# Patient Record
Sex: Female | Born: 1935 | Race: White | Hispanic: No | Smoking: Former smoker
Health system: Southern US, Community
[De-identification: ages and names within clinical notes are randomized; demographics above are authoritative.]

## PROBLEM LIST (undated history)

## (undated) DIAGNOSIS — M069 Rheumatoid arthritis, unspecified: Secondary | ICD-10-CM

## (undated) DIAGNOSIS — M1990 Unspecified osteoarthritis, unspecified site: Secondary | ICD-10-CM

## (undated) DIAGNOSIS — E785 Hyperlipidemia, unspecified: Secondary | ICD-10-CM

## (undated) DIAGNOSIS — K589 Irritable bowel syndrome without diarrhea: Secondary | ICD-10-CM

## (undated) DIAGNOSIS — E039 Hypothyroidism, unspecified: Secondary | ICD-10-CM

## (undated) DIAGNOSIS — F319 Bipolar disorder, unspecified: Secondary | ICD-10-CM

## (undated) DIAGNOSIS — F419 Anxiety disorder, unspecified: Secondary | ICD-10-CM

## (undated) DIAGNOSIS — F32A Depression, unspecified: Secondary | ICD-10-CM

## (undated) HISTORY — DX: Hypothyroidism, unspecified: E03.9

## (undated) HISTORY — DX: Bipolar disorder, unspecified: F31.9

## (undated) HISTORY — PX: ABDOMINAL HYSTERECTOMY: SHX81

## (undated) HISTORY — DX: Hyperlipidemia, unspecified: E78.5

## (undated) HISTORY — PX: COLONOSCOPY: SHX174

## (undated) HISTORY — DX: Anxiety disorder, unspecified: F41.9

## (undated) HISTORY — DX: Irritable bowel syndrome, unspecified: K58.9

## (undated) HISTORY — PX: COLOSTOMY: SHX63

## (undated) HISTORY — PX: UPPER GASTROINTESTINAL ENDOSCOPY: SHX188

## (undated) HISTORY — PX: EYE SURGERY: SHX253

## (undated) HISTORY — DX: Rheumatoid arthritis, unspecified: M06.9

## (undated) HISTORY — DX: Unspecified osteoarthritis, unspecified site: M19.90

---

## 2001-05-08 ENCOUNTER — Other Ambulatory Visit: Admission: RE | Admit: 2001-05-08 | Discharge: 2001-05-08 | Payer: Self-pay | Admitting: Obstetrics and Gynecology

## 2001-12-25 ENCOUNTER — Ambulatory Visit (HOSPITAL_COMMUNITY): Admission: RE | Admit: 2001-12-25 | Discharge: 2001-12-25 | Payer: Self-pay | Admitting: Internal Medicine

## 2003-04-08 ENCOUNTER — Ambulatory Visit (HOSPITAL_COMMUNITY): Admission: RE | Admit: 2003-04-08 | Discharge: 2003-04-08 | Payer: Self-pay | Admitting: Internal Medicine

## 2003-04-18 ENCOUNTER — Ambulatory Visit (HOSPITAL_COMMUNITY): Admission: RE | Admit: 2003-04-18 | Discharge: 2003-04-18 | Payer: Self-pay | Admitting: Internal Medicine

## 2003-04-25 ENCOUNTER — Ambulatory Visit (HOSPITAL_COMMUNITY): Admission: RE | Admit: 2003-04-25 | Discharge: 2003-04-25 | Payer: Self-pay | Admitting: Obstetrics and Gynecology

## 2004-04-14 ENCOUNTER — Ambulatory Visit: Payer: Self-pay | Admitting: Orthopedic Surgery

## 2004-05-11 ENCOUNTER — Ambulatory Visit (HOSPITAL_COMMUNITY): Admission: RE | Admit: 2004-05-11 | Discharge: 2004-05-11 | Payer: Self-pay | Admitting: Internal Medicine

## 2004-05-31 ENCOUNTER — Ambulatory Visit (HOSPITAL_COMMUNITY): Admission: RE | Admit: 2004-05-31 | Discharge: 2004-05-31 | Payer: Self-pay | Admitting: Internal Medicine

## 2004-09-29 ENCOUNTER — Ambulatory Visit: Payer: Self-pay | Admitting: Internal Medicine

## 2004-10-06 ENCOUNTER — Ambulatory Visit: Payer: Self-pay | Admitting: Internal Medicine

## 2004-10-06 ENCOUNTER — Ambulatory Visit (HOSPITAL_COMMUNITY): Admission: RE | Admit: 2004-10-06 | Discharge: 2004-10-06 | Payer: Self-pay | Admitting: Internal Medicine

## 2004-11-04 ENCOUNTER — Ambulatory Visit: Payer: Self-pay | Admitting: Internal Medicine

## 2004-11-05 ENCOUNTER — Ambulatory Visit (HOSPITAL_COMMUNITY): Admission: RE | Admit: 2004-11-05 | Discharge: 2004-11-05 | Payer: Self-pay | Admitting: Internal Medicine

## 2005-04-12 ENCOUNTER — Ambulatory Visit: Payer: Self-pay | Admitting: Internal Medicine

## 2005-05-05 ENCOUNTER — Ambulatory Visit: Payer: Self-pay | Admitting: Internal Medicine

## 2005-08-01 ENCOUNTER — Ambulatory Visit: Payer: Self-pay | Admitting: Internal Medicine

## 2006-04-17 ENCOUNTER — Ambulatory Visit (HOSPITAL_COMMUNITY): Admission: RE | Admit: 2006-04-17 | Discharge: 2006-04-17 | Payer: Self-pay | Admitting: Internal Medicine

## 2006-04-25 ENCOUNTER — Ambulatory Visit (HOSPITAL_COMMUNITY): Admission: RE | Admit: 2006-04-25 | Discharge: 2006-04-25 | Payer: Self-pay | Admitting: Internal Medicine

## 2006-05-10 ENCOUNTER — Ambulatory Visit (HOSPITAL_COMMUNITY): Admission: RE | Admit: 2006-05-10 | Discharge: 2006-05-10 | Payer: Self-pay | Admitting: General Surgery

## 2006-07-19 ENCOUNTER — Ambulatory Visit (HOSPITAL_COMMUNITY): Admission: RE | Admit: 2006-07-19 | Discharge: 2006-07-19 | Payer: Self-pay | Admitting: Internal Medicine

## 2006-08-16 ENCOUNTER — Ambulatory Visit (HOSPITAL_COMMUNITY): Admission: RE | Admit: 2006-08-16 | Discharge: 2006-08-16 | Payer: Self-pay | Admitting: Otolaryngology

## 2007-01-02 ENCOUNTER — Ambulatory Visit (HOSPITAL_COMMUNITY): Admission: RE | Admit: 2007-01-02 | Discharge: 2007-01-02 | Payer: Self-pay | Admitting: Internal Medicine

## 2007-01-12 ENCOUNTER — Encounter (HOSPITAL_COMMUNITY): Admission: RE | Admit: 2007-01-12 | Discharge: 2007-02-11 | Payer: Self-pay | Admitting: Internal Medicine

## 2007-06-19 ENCOUNTER — Ambulatory Visit (HOSPITAL_COMMUNITY): Admission: RE | Admit: 2007-06-19 | Discharge: 2007-06-19 | Payer: Self-pay | Admitting: Internal Medicine

## 2007-06-25 ENCOUNTER — Ambulatory Visit (HOSPITAL_COMMUNITY): Admission: RE | Admit: 2007-06-25 | Discharge: 2007-06-25 | Payer: Self-pay | Admitting: Internal Medicine

## 2009-05-12 ENCOUNTER — Ambulatory Visit: Payer: Self-pay | Admitting: Orthopedic Surgery

## 2009-05-12 DIAGNOSIS — M7750 Other enthesopathy of unspecified foot: Secondary | ICD-10-CM | POA: Insufficient documentation

## 2009-05-12 DIAGNOSIS — M21619 Bunion of unspecified foot: Secondary | ICD-10-CM | POA: Insufficient documentation

## 2009-05-14 ENCOUNTER — Encounter: Payer: Self-pay | Admitting: Orthopedic Surgery

## 2009-11-19 ENCOUNTER — Ambulatory Visit (HOSPITAL_COMMUNITY): Admission: RE | Admit: 2009-11-19 | Discharge: 2009-11-19 | Payer: Self-pay | Admitting: Obstetrics and Gynecology

## 2009-11-20 ENCOUNTER — Encounter (INDEPENDENT_AMBULATORY_CARE_PROVIDER_SITE_OTHER): Payer: Self-pay

## 2009-12-09 ENCOUNTER — Ambulatory Visit (HOSPITAL_COMMUNITY): Admission: RE | Admit: 2009-12-09 | Discharge: 2009-12-09 | Payer: Self-pay | Admitting: Obstetrics and Gynecology

## 2009-12-28 ENCOUNTER — Ambulatory Visit: Payer: Self-pay | Admitting: Internal Medicine

## 2009-12-31 ENCOUNTER — Ambulatory Visit: Payer: Self-pay | Admitting: Internal Medicine

## 2009-12-31 ENCOUNTER — Ambulatory Visit (HOSPITAL_COMMUNITY): Admission: RE | Admit: 2009-12-31 | Discharge: 2009-12-31 | Payer: Self-pay | Admitting: Internal Medicine

## 2010-04-04 ENCOUNTER — Encounter: Payer: Self-pay | Admitting: Otolaryngology

## 2010-04-15 NOTE — Letter (Signed)
Summary: Historic Patient File  Historic Patient File   Imported By: Elvera Maria 05/19/2009 11:52:00  _____________________________________________________________________  External Attachment:    Type:   Image     Comment:   history form

## 2010-04-15 NOTE — Progress Notes (Signed)
Summary: Initial evaluation  Initial evaluation   Imported By: Jacklynn Ganong 05/12/2009 08:44:16  _____________________________________________________________________  External Attachment:    Type:   Image     Comment:   External Document

## 2010-04-15 NOTE — Assessment & Plan Note (Signed)
Summary: pos fx toe needs xr/medicare/mut of [place]/bsf   Vital Signs:  Patient profile:   77 year old female Weight:      130 pounds Pulse rate:   70 / minute Resp:     18 per minute  Vitals Entered By: Fuller Canada MD (May 12, 2009 1:36 PM)  Visit Type:  new patient Referring Provider:  self Primary Provider:  Dr. Ouida Sills  CC:  right foot pain.  History of Present Illness: I saw Erica [name] in the office today for an initial visit.  She is a 77 years old woman with the complaint of:  right foot pain.  her pain started approximately 2 weeks ago after a long walk with supportive shoes, no injury, there is some swelling and pain under the metatarsals. Pain described as throbbing moderate, 7/10, constant, worse when walking started gradually better with weightbearing worse with walking.  June 2010 had a right bunionectomy   X-rays in our office today.  Meds: Synthroid, Nardil, Vitamins, Fish oil, B12.    Allergies (verified): 1)  ! 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 GI:  Complains of diarrhea; denies nausea, vomiting, constipation, difficulty swallowing, ulcers, GERD, and reflux. Neuro:  Complains of dizziness and tremor; denies headache, migraines, numbness, weakness, and unsteady walking. MS:  Complains of joint pain; denies rheumatoid arthritis, joint swelling, gout, bone cancer, osteoporosis, and . Psych:  Complains of anxiety; denies depression, mood swings, panic attack, bipolar, and schizophrenia; nervousness. Immunology:  Denies seasonal allergies, sinus problems, and allergic to bee stings; adverse rxn to foods due to MAOI.  The  review of systems is negative for Cardiac , Resp, GU, Endo, Derm, EENT, and Lymphatic.  Physical Exam  Additional Exam:  VS reviewed are stable  GENERAL: Appearance is normal   CDV: normal pulse and temperature   Skin: was normal   Neuro: sensation was normal   MSK  She walks fairly normally with her sneaker on but when she takes it off she has an avoidance gait of the metatarsals  On exam there is a pronated toe with a bunion deformity and hallux valgus there is an incision over the medial side of the great toe with a fairly flexible deformity.  There is no tenderness there.  She has normal flexion and extension at the metatarsophalangeal joint.  The joint is stable.  There is also lesser toe deformity but it is mild at the second digit.  There is tenderness under the second and third metatarsal heads.   no atrophy in the muscles of the foot.    Impression & Recommendations:  Problem # 1:  METATARSALGIA (ICD-726.70)  3 x-rays are done of the LEFT toe ordered here  There is evidence of a previous lumpectomy, there is hallux valgus.  There is mild osteopenia.  Impression hallux valgus with previous surgery noted no major arthritic changes seen at the great toe  The patient has metatarsalgia probably a transfer type lesion.  Do not think it's a stress fracture.  She did wear a boot for a while and it made her foot worse.  Recommend metatarsal pads  Recommend consult with foot and ankle specialist if needed if the  metatarsal pads don't work  Orders: New Patient Level III [phone]) Foot x-ray complete, minimum 3 views (65784)  Problem # 2:  BUNION (ICD-727.1)  Orders: New Patient Level III (69629) Foot x-ray complete, minimum 3 views (52841)  Patient Instructions: 1)  METATARSAL PADS  2)  F/U as needed

## 2010-04-15 NOTE — Letter (Signed)
Summary: Recall, Screening Colonoscopy Only  [facility]  [address]   [place], [place] [address]   Phone: [phone]  Fax: [phone]    November 20, 2009  Erica [name] [address] [place], [place]  [address] 02/08/1933   Dear Ms. Kirkpatrick,   Our records indicate it is time to schedule your colonoscopy.    Please call our office at [phone] and ask for the nurse.   Thank you,  Hendricks Limes, LPN Cloria Spring, LPN  [facility] Ph: [phone]   Fax: [phone]

## 2010-05-20 ENCOUNTER — Ambulatory Visit (INDEPENDENT_AMBULATORY_CARE_PROVIDER_SITE_OTHER): Payer: Medicare Other | Admitting: Internal Medicine

## 2010-05-20 DIAGNOSIS — R197 Diarrhea, unspecified: Secondary | ICD-10-CM

## 2010-05-26 ENCOUNTER — Other Ambulatory Visit (HOSPITAL_COMMUNITY): Payer: Self-pay | Admitting: Internal Medicine

## 2010-05-26 ENCOUNTER — Ambulatory Visit (HOSPITAL_COMMUNITY)
Admission: RE | Admit: 2010-05-26 | Discharge: 2010-05-26 | Disposition: A | Discharge status: Home / Self Care | Payer: Medicare Other | Source: Ambulatory Visit | Attending: Internal Medicine | Admitting: Internal Medicine

## 2010-05-26 DIAGNOSIS — M2010 Hallux valgus (acquired), unspecified foot: Secondary | ICD-10-CM | POA: Insufficient documentation

## 2010-05-26 DIAGNOSIS — S92919A Unspecified fracture of unspecified toe(s), initial encounter for closed fracture: Secondary | ICD-10-CM

## 2010-05-26 DIAGNOSIS — X58XXXA Exposure to other specified factors, initial encounter: Secondary | ICD-10-CM | POA: Insufficient documentation

## 2010-05-26 DIAGNOSIS — M79609 Pain in unspecified limb: Secondary | ICD-10-CM | POA: Insufficient documentation

## 2010-05-26 DIAGNOSIS — M19079 Primary osteoarthritis, unspecified ankle and foot: Secondary | ICD-10-CM | POA: Insufficient documentation

## 2010-06-14 ENCOUNTER — Ambulatory Visit (INDEPENDENT_AMBULATORY_CARE_PROVIDER_SITE_OTHER): Payer: Medicare Other | Admitting: Internal Medicine

## 2010-06-14 DIAGNOSIS — K589 Irritable bowel syndrome without diarrhea: Secondary | ICD-10-CM

## 2010-06-14 DIAGNOSIS — K219 Gastro-esophageal reflux disease without esophagitis: Secondary | ICD-10-CM

## 2010-06-28 NOTE — Consult Note (Signed)
Erica [name], Erica [name]               ACCOUNT NO.:  [id number]  MEDICAL RECORD NO.:  [id number]           PATIENT TYPE: AMB.  LOCATION: [place].                    FACILITY[facility]  PHYSICIAN:  Lionel December, M.D.    DATE OF BIRTH:  1932-11-01  DATE:  06/14/2010                                 OFFICE VISIT.   PRESENTING COMPLAINT:  Follow up for IBS and GERD.  SUBJECTIVE:  Shanikqua is 77 year old Caucasian female patient of Dr. Ouida Sills, who is here for scheduled visit.  She was recently seen by Ms. Sasser, last month and she recommended omeprazole for GERD symptoms, but because of potential side effects and diarrhea, she decided not to take it.  She also recommended high-fiber diet, but she did not feel any better.  She therefore took [place] which she finished 9 days ago.  She states since then she has only had one episode of explosive diarrhea and is having much less bloating.  She states it rarely has helped her a great deal.  She is also noted mayonnaise gives her diarrhea.  She complains of regurgitation and bitter taste in her mouth usually after evening meal if she bends forward.  She was watching Dr. Neil Crouch program and found that chewing gum helps, but she has been using every night.  She denies dysphagia, chronic cough, or sore throat.  She also denies melena or rectal bleeding.  She is also worried about her husband was having memory problems.  Gita's last high risk colonoscopy was in October 2011.  She had a small polyp removed from her distal sigmoid which was hypoplastic and a biopsy from her sigmoid colon was negative for microscopic or collagenous colitis.  CURRENT MEDICATIONS: 1. Synthroid 75 mcg daily. 2. Nardil 15 mg b.i.d. 3. Metamucil two tablespoonful bedtime. 4. Colace 1-2 daily p.r.n. 5. Imodium OTC p.r.n. 6. Vitamin B12 sublingual daily. 7. Calcium with vitamin D daily which she uses.  OBJECTIVE:  VITAL SIGNS:  Weight 131 pounds which is stable,  she is 63 inches tall, pulse 76 per minute, blood pressure 126/74, temp is 98.4. HEENT:  Conjunctivae is pink.  Sclerae nonicteric.  Oropharyngeal mucosa is normal.  No neck masses are noted. ABDOMEN:  Symmetrical.  Bowel sounds are hyperactive on palpation, soft abdomen without organomegaly or masses. EXTREMITIES:  No peripheral edema or clubbing noted.  ASSESSMENT: 1. Chronic gastroesophageal reflux disease.  She is having     intermittent symptoms usually after evening meal.  It will be     reasonable to try OTC H2B and hold off PPI for now. 2. Irritable bowel syndrome.  She seemed to have improved with     Xifaxan, it remains to be seen follow and the benefit would last. 3. Family history of colorectal carcinoma.  She is up-to-date on her     high-risk screening which was in October 2011.  She will now be due     for another one until October 2016.  PLAN: 1. Take Pepcid 20 mg, ranitidine 150 mg before evening meal. 2. She will continue high-fiber diet, Metamucil, and Colace. 3. Unless she has problem, she will return for OV in 1  year.     Lionel December, M.D.     NR/MEDQ  D:  06/15/2010  T:  06/15/2010  Job:  [id number]  cc:   Kingsley Callander. Ouida Sills, MD Fax: [phone]  Electronically Signed by Lionel December M.D. on 06/27/2010 11:12:01 PM

## 2010-07-30 NOTE — Procedures (Signed)
[name], Erica [name]               ACCOUNT NO.:  [id number]   MEDICAL RECORD NO.:  [id number]          PATIENT TYPE:  OUT   LOCATION:  RESP                          FACILITY:  [facility]   PHYSICIAN:  Edward L. Juanetta Gosling, M.D.DATE OF BIRTH:  [birth year]   DATE OF PROCEDURE:  DATE OF DISCHARGE:  07/19/2006                            PULMONARY FUNCTION TEST   PULMONARY FUNCTION TEST:  1. Spirometry shows no ventilatory defect, but evidence of airflow      obstruction.  2. Lung volumes show mild hyperexpansion suggesting air trapping.  3. DLCO is normal.  4. This is consistent with asthma/chronic bronchitis, but probably no      emphysematous changes based on DLCO being normal.      Edward L. Juanetta Gosling, M.D.  Electronically Signed     ELH/MEDQ  D:  07/25/2006  T:  07/25/2006  Job:  [id number]   cc:   Kingsley Callander. Ouida Sills, MD  Fax: [phone]

## 2010-07-30 NOTE — Op Note (Signed)
   [name], [name]                         ACCOUNT NO.:  [id number]   MEDICAL RECORD NO.:  [id number]                   PATIENT TYPE:  AMB   LOCATION:  DAY                                  FACILITY:  [facility]   PHYSICIAN:  Lionel December, M.D.                 DATE OF BIRTH:  11/25/1932   DATE OF PROCEDURE:  12/25/2001  DATE OF DISCHARGE:                                 OPERATIVE REPORT   PROCEDURE:  Total colonoscopy.   ENDOSCOPIST:  Lionel December, M.D.   INDICATIONS:  This patient is a 77 year old Caucasian female who is  undergoing screening colonoscopy.  She is high risk for colon carcinoma  because of family history.  Her last colonoscopy was in February 1998 which  was prior to a takedown of her colostomy performed in 11/97 for colonic  obstruction secondary to diverticular disease.   The procedure and risks were reviewed with the patient and informed consent  was obtained.   PREOPERATIVE MEDICATIONS:  Fentanyl 25 mcg, IV Versed 7 mg IV in divided  dose.   INSTRUMENT:  Olympus video system.   FINDINGS:  Procedure performed in endoscopy suite.  The patient's vital  signs and O2 saturation were monitored during the procedure and remained  stable.  The patient was placed in the left lateral recumbent position and  rectal examination was performed.  No abnormality noted on external or  digital exam.   The scope was placed in the rectum and advanced under vision into the  sigmoid colon.  There was a single diverticulum at anastomosis at about 27  cm.  The scope was easily passed beyond this.  Preparation was excellent.  The scope was passed into the cecum which was identified by ileocecal valve.  Beyond the cecum was normal.  Pictures were taken, but some were lost.  As  the scope was withdrawn the colonic mucosa was once again carefully  examined; and there were no polyps or tumor masses.  Rectal mucosa was  normal.   The scope was retroflexed to examine the anorectal  junction; and moderate  sized hemorrhoids were noted below the dentate line.  The endoscope was  straightened and withdrawn.  The patient tolerated the procedure well.   FINAL DIAGNOSES:  1. A single diverticulum at colic anastomosis and external hemorrhoids.  2. Otherwise normal colonoscopy.    RECOMMENDATIONS:  1. She will resume her usual medications and diet.  2. Yearly Hemoccults.  3. Should consider repeat exam 5 years from now.                                               Lionel December, M.D.    NR/MEDQ  D:  12/25/2001  T:  12/25/2001  Job:  [id number]

## 2010-07-30 NOTE — H&P (Signed)
Erica [name], Erica [name]               ACCOUNT NO.:  [id number]   MEDICAL RECORD NO.:  [id number]           PATIENT TYPE:  AMB   LOCATION:                                FACILITY:  [facility]   PHYSICIAN:  Lionel December, M.D.    DATE OF BIRTH:  11/12/1932   DATE OF ADMISSION:  DATE OF DISCHARGE:  LH                                HISTORY & PHYSICAL   PRIMARY CARE PHYSICIAN:  Kingsley Callander. Ouida Sills, MD   CHIEF COMPLAINT:  Incomplete evacuation, family history of colon cancer,  personal history of adenomatous polyp.   HISTORY OF PRESENT ILLNESS:  Erica [name] is a 77 year old Caucasian female  with a recent change in bowel habits. She notes longstanding history of IBS  prior to colostomy secondary to diverticulitis.  She had subsequent  reversal.  Over the last year, she has been having several trips to the rest  room for defecation. She notes sensation of incomplete evacuation and only  has small amounts of stool with each episode. The stool and brown and  semiformed. She has episodes of this several times a week where she will go  several times to the bathroom.  Denies any abdominal pain. She has a high-  fiber diet but does not take any fiber supplements. She denies any problems  with constipation recently, although her past history of IBS, which was  diarrhea predominant.  She denies rectal bleeding, melena, or mucus in her  stools.   PAST MEDICAL HISTORY:  1.  Osteoporosis.  2.  Hypothyroidism.  3.  Diverticulitis with colostomy and take down.  4.  IBS, diarrhea predominant.  5.  Depression and anxiety treated with MAO inhibitor for the last 20 years.  6.  Total abdominal hysterectomy.  7.  Two herniated disks, L3 and L4, followed by Dr. __________.  8.  Osteoporoses.  9.  Last colonoscopy was by Dr. Karilyn Cota December 25, 2001.  She had a single      diverticular, colonic anastomosis, and external hemorrhoids. Otherwise      normal colonoscopy.  At the time of colostomy take down, she underwent a    colonoscopy by Dr. Karilyn Cota.  She was found to have an adenomatous polyp      in the cecum.   CURRENT MEDICATIONS:  1.  Synthroid 0.25 mg daily.  2.  Nardil 15 mg b.i.d.  3.  Centrum multivitamins.   ALLERGIES:  DEMEROL in bold and PENICILLIN in bold.   FAMILY HISTORY:  Positive for mother with colon cancer, deceased at age 58.  Father deceased, unknown etiology.   SOCIAL HISTORY:  Erica [name] has been married for 47 years. She has three  grown, healthy children. She is a Futures trader.  She has a 20-year history of  consuming about two packs of cigarettes a day, quitting 20 some odd years  ago.  Denies any alcohol or drug use.   REVIEW OF SYSTEMS:  CONSTITUTIONAL:  Weight stable.  Denies anorexia or  early satiety.  Denies fatigue, fever, or chills.  Denies palpitations.  Denies shortness of breath, dyspnea, cough, hemoptysis.  ENDOCRINE:  She  does have history of hypothyroidism, has had a recent TSH through Dr.  Alonza Smoker office since the beginning of the year where her medication was  changed.  Denies history of diabetes mellitus.   PHYSICAL EXAMINATION:  VITAL SIGNS: Weight 126.5 pounds.  Height 6 feet 3  inches. Temperature 98.1, blood pressure 132/74, pulse 76.  GENERAL:  Erica [name] is a 77 year old Caucasian female who is alert and  pleasant, no acute distress.  HEENT:  Normocephalic.  Sclerae anicteric.  Conjunctivae pink.  Oropharynx  pink and moist without lesions.  NECK:  Supple without thyromegaly.  CHEST:  Regular rate and rhythm.  Normal S1 and S2 without murmur, rub, or  gallops.  Lungs clear to auscultation bilaterally.  ABDOMEN:  Large, well-healed midline scar.  Positive bowel sounds x 4.  No  bruits auscultated.  Soft, nontender, nondistended without palpable mass or  hepatosplenomegaly.  No rebound tenderness or guarding.  RECTAL:  She does have a large 1.5 cm protruding external hemorrhoid that is  not actively found, severe erythematous.  She has good sphincter  tone.  Internal exam reveals a small amount of formed brown stool in the vault  which is hemoccult positive.  No internal mass palpated.  EXTREMITIES:  2+ pedal pulses bilaterally, no edema.  SKIN:  Pink, warm, dry, without rash or jaundice.   IMPRESSION:  Ms.  [name] is a 77 year old Caucasian female with personal  history of adenomatous polyps, family history of mother with colon cancer,  and a sensation of incomplete evacuation and change in bowel habits for the  last year.  Her last colonoscopy was almost three years ago now.  She was  found to have hemoccult-positive stool on exam given the fact that she is  high risk for colon cancer, I feel she should be further evaluated with  colonoscopy at this time.   RECOMMENDATION:  1.  Will schedule colonoscopy with Dr. Karilyn Cota in the near future.  I      discussed the procedure including risks and      benefits including but not limited to bleeding, infection, perforation,      and drug reaction.  She agrees with this plan.  Consent will be      obtained.   I would like to thank Dr. Ouida Sills in [place], [place].       KC/MEDQ  D:  09/29/2004  T:  09/29/2004  Job:  [id number]   cc:   Kingsley Callander. Ouida Sills, MD  [address]  [place]  [place] [address]  Fax: [phone]

## 2010-07-30 NOTE — Op Note (Signed)
NAMEALURA, Erica [name]               ACCOUNT NO.:  [id number]   MEDICAL RECORD NO.:  [id number]          PATIENT TYPE:  AMB   LOCATION:  DAY                           FACILITY:  [facility]   PHYSICIAN:  Lionel December, M.D.    DATE OF BIRTH:  Jul 22, 1932   DATE OF PROCEDURE:  10/06/2004  DATE OF DISCHARGE:                                 OPERATIVE REPORT   PROCEDURE:  Colonoscopy.   INDICATION:  Erica [name] is a 77 year old Caucasian female with history of  colonic polyps and family history of colon carcinoma who presents with sense  of incomplete evacuation and multiple bowel movements. The stools been  formed though. It is suspected that her symptoms is secondary to IBS.  However, given her personal history and family history, she is undergoing  colonoscopy now instead of two years from now. Her last colonoscopy was in  October 2003, and plan was to come back in October 2008. Procedure risks  were reviewed with the patient, and informed consent was obtained. She is  also status post sigmoid colon resection for colonic obstruction secondary  to diverticulitis. She may also have an anastomotic stricture. Procedure  risks were reviewed with the patient, and informed consent was obtained.   PREMEDICATION:  Fentanyl 50 mcg IV, Versed 4 mg IV.   FINDINGS:  Procedure performed in endoscopy suite. The patient's vital signs  and O2 saturation were monitored during the procedure and remained stable.  The patient was placed in left lateral position and rectal examination  performed. No abnormality noted on external or digital exam. The Olympus  videoscope was placed in rectum and advanced under vision into sigmoid  colon. Anastomosis was located at 24-25 cm and was wide open. Pictures taken  for record. Scope was passed in the proximal sigmoid colon and beyond.  Preparation was satisfactory to excellent. She had some liquid stool which  was easily suctioned out, but the proximal colon was excellently  prepped.  Scope was passed to the cecum which was identified by appendiceal orifice  and ileocecal valve. Pictures taken for the record. Blunt-end cecum was  seen, and there were no abnormalities. As the scope was withdrawn, the rest  of the colonic mucosa was examined for the second time, and there no polyps,  diverticular changes or other abnormalities. Rectal mucosa similarly was  normal. Scope was retroflexed to examine anorectal junction, and moderate-  sized hemorrhoids were noted below the dentate line. Endoscope was  straightened and withdrawn. The patient tolerated the procedure well.   FINAL DIAGNOSIS:  1.  External hemorrhoids.  2.  Wide open colonic anastomosis.  3.  No evidence of colonic neoplasms, polyps or colitis.   RECOMMENDATIONS:  1.  She will resume her usual medications.  2.  FiberChoice 2 tablets q.d. If these measures do not help her symptoms,      may add antispasmodic at a low dose.  3.  Consider next colonoscopy in five years from now.       NR/MEDQ  D:  10/06/2004  T:  10/06/2004  Job:  [id number]   cc:  Kingsley Callander. Ouida Sills, MD  [address]  [place]  [place] [address]  Fax: [phone]

## 2011-04-19 ENCOUNTER — Encounter (INDEPENDENT_AMBULATORY_CARE_PROVIDER_SITE_OTHER): Payer: Self-pay | Admitting: Internal Medicine

## 2011-04-19 ENCOUNTER — Ambulatory Visit (INDEPENDENT_AMBULATORY_CARE_PROVIDER_SITE_OTHER): Payer: MEDICARE | Admitting: Internal Medicine

## 2011-04-19 DIAGNOSIS — R1013 Epigastric pain: Secondary | ICD-10-CM

## 2011-04-19 DIAGNOSIS — K589 Irritable bowel syndrome without diarrhea: Secondary | ICD-10-CM | POA: Insufficient documentation

## 2011-04-19 DIAGNOSIS — K219 Gastro-esophageal reflux disease without esophagitis: Secondary | ICD-10-CM

## 2011-04-19 MED ORDER — PANTOPRAZOLE SODIUM 40 MG PO TBEC: 40.0000 mg | DELAYED_RELEASE_TABLET | Freq: Every day | ORAL | Status: DC

## 2011-04-19 NOTE — Patient Instructions (Signed)
Discontinue Zantac. Start pantoprazole 40 mg by mouth 60 minutes before breakfast daily. Take Questran/cholestyramine 2 hours before or after taking other medications. Physician will contact you with results of ultrasound. Hemoccult x1

## 2011-04-19 NOTE — Progress Notes (Signed)
Presenting complaint; Epigastric pain, heartburn and diarrhea. Subjective: Patient is a 78 year old Caucasian female who presents with multiple symptoms. She was last seen in April 2012 for diarrhea  secondary to IBS. She recently had a flareup on Questran and feels much better for you her stool frequency has decreased to to 3 per day but she still having urgency and unpredictable bowel movements. She woke up this morning at 4:15 AM to have BM. She is up-to-date on her colonoscopies her last exam was in October 2011. She also complains of frequent heartburn postprandial regurgitation and burning epigastric pain. These symptoms been going on for the past 2 weeks. Her husband states that she's been using heating pad across her upper abdomen at night for relief. She has taken Zantac hasn't helped she believes she gets headache from it. She also is watching her diet and essentially on bland foods. She complains of intermittent nausea which she describes as morning sickness but no vomiting reported. She also complains of difficulty swallowing pills but denies dysphagia with foods. Her appetite is good and her weight has been stable. Current Medications: Current Outpatient Prescriptions  Medication Sig Dispense Refill  . cholestyramine (QUESTRAN) 4 G packet Take 1 packet by mouth daily. Patient states that she uses 1/2 - 3/4 pack a day      . Cyanocobalamin (VITAMIN B-12) 1000 MCG SUBL Place under the tongue daily.      Marland Kitchen levothyroxine (SYNTHROID) 75 MCG tablet Take 75 mcg by mouth daily.      . phenelzine (NARDIL) 15 MG tablet Take 15 mg by mouth 2 (two) times daily.      . ranitidine (ZANTAC) 300 MG tablet Take 300 mg by mouth at bedtime.        Objective: Blood pressure 140/90, pulse 84, temperature 97.9 F (36.6 C), temperature source Oral, resp. rate 12, height 5\' 3"  (1.6 m), weight 133 lb (60.328 kg). Conjunctiva is pink. Sclera is nonicteric Oral pharyngeal mucosa is normal. No neck masses or  thyromegaly noted. Cardiac exam with regular rhythm normal S1 and S2. No murmur or gallop noted. Lungs are clear to auscultation. Abdomen abdomen is flat. Bowel sounds are normal. On palpation soft abdomen with mild midepigastric tenderness. No organomegaly or masses noted. No LE edema or clubbing noted.  Assessment: #1. IBS. She's had recent flare up of her symptoms been doing better with cholestyramine. She just needs to be sure that she does not take cholestyramine with other medicines. #2. Epigastric pain and frequent heartburn. These symptoms could be secondary to GERD or peptic ulcer disease. Last ultrasound was in August 2006 and was unremarkable.   Plan: Discontinue Zantac. Pantoprazole 40 mg by mouth every morning. Continue Questran or cholestyramine 2- 4 g daily but take it 2 hours before or after taking other medications. Hemoccults x1. Hepatobiliary ultrasound

## 2011-04-25 ENCOUNTER — Ambulatory Visit (HOSPITAL_COMMUNITY)
Admission: RE | Admit: 2011-04-25 | Discharge: 2011-04-25 | Disposition: A | Discharge status: Home / Self Care | Payer: Medicare Other | Source: Ambulatory Visit | Attending: Internal Medicine | Admitting: Internal Medicine

## 2011-04-25 ENCOUNTER — Ambulatory Visit (INDEPENDENT_AMBULATORY_CARE_PROVIDER_SITE_OTHER): Payer: Medicare Other | Admitting: Internal Medicine

## 2011-04-25 DIAGNOSIS — R1013 Epigastric pain: Secondary | ICD-10-CM

## 2011-04-25 DIAGNOSIS — R109 Unspecified abdominal pain: Secondary | ICD-10-CM | POA: Insufficient documentation

## 2011-05-02 ENCOUNTER — Telehealth (INDEPENDENT_AMBULATORY_CARE_PROVIDER_SITE_OTHER): Payer: Self-pay | Admitting: *Deleted

## 2011-05-02 NOTE — Telephone Encounter (Signed)
The patient states that she is having bad stomach cramps,and heaviness in here chest.She is not sure if it is the Pantoprazole or the Questran.. I told her that I would talk with Dr. Karilyn Cota and call her back. Per Dr. Karilyn Cota ,patient is to stop the Questran. If the symptoms continue stop Pantoprazole and begin Questran again. Patient called and made aware.

## 2011-06-06 ENCOUNTER — Encounter (INDEPENDENT_AMBULATORY_CARE_PROVIDER_SITE_OTHER): Payer: Self-pay | Admitting: Internal Medicine

## 2011-06-06 ENCOUNTER — Ambulatory Visit (INDEPENDENT_AMBULATORY_CARE_PROVIDER_SITE_OTHER): Payer: Medicare Other | Admitting: Internal Medicine

## 2011-06-06 VITALS — BP 122/84 | HR 88 | Temp 97.7°F | Ht 63.5 in | Wt 133.4 lb

## 2011-06-06 DIAGNOSIS — K625 Hemorrhage of anus and rectum: Secondary | ICD-10-CM

## 2011-06-06 NOTE — Patient Instructions (Signed)
Dr Karilyn Cota will proceed with a colonoscopy if any further rectal bleeding.

## 2011-06-06 NOTE — Progress Notes (Signed)
Subjective:     Patient ID: Erica [name], female   DOB: 1932/04/21, 78 y.o.   MRN: [id number]  HPI Presents today with c/o rectal bleeding bright red rectal bleeding 10 days ago. She had 2 episodes of rectal bleeding.  She says she has IBS and has to have BMs quite frequently. She medicated herself with Imodium and started Align and starting eating fiber.  She says that has regulated the diarrhea.  She has not bled in over a week. She had a large BM and it was hard. She is having a BM x 2 a day.  12/31/2009 Colonoscopy:Family hx of colon cancer in mother and 2 other great uncles with colon cancer. Two small polyps ablated.  Biopsy: Hyperplastic polyp. Review of Systems see hpi.     Current Outpatient Prescriptions  Medication Sig Dispense Refill  . bifidobacterium infantis (ALIGN) capsule Take 1 capsule by mouth daily.      . Cyanocobalamin (VITAMIN B-12) 1000 MCG SUBL Place under the tongue daily.      Marland Kitchen levothyroxine (SYNTHROID) 75 MCG tablet Take 75 mcg by mouth daily.      . phenelzine (NARDIL) 15 MG tablet Take 15 mg by mouth 2 (two) times daily.      . cholestyramine (QUESTRAN) 4 G packet Take 1 packet by mouth daily. Patient states that she uses 1/2 - 3/4 pack a day      . pantoprazole (PROTONIX) 40 MG tablet Take 1 tablet (40 mg total) by mouth daily.  30 tablet  5   Past Medical History  Diagnosis Date  . Irritable bowel syndrome   . Thyroid condition    Past Surgical History  Procedure Date  . Colostomy   . Abdominal hysterectomy   . Colonoscopy   . Upper gastrointestinal endoscopy    History   Social History  . Marital Status: Married    Spouse Name: N/A    Number of Children: N/A  . Years of Education: N/A   Occupational History  . Not on file.   Social History Main Topics  . Smoking status: Former Smoker    Types: Cigarettes    Quit date: 04/18/1981  . Smokeless tobacco: Never Used  . Alcohol Use: No  . Drug Use: No  . Sexually Active: Not on file    Other Topics Concern  . Not on file   Social History Narrative  . No narrative on file   Family Status  Relation Status Death Age  . Mother Deceased 9  . Father Deceased 22    Natural causes  . Daughter Alive     Some diabetes  when she is pregnancy  . Son Alive   . Son Alive     some heart caths have been done   Allergies  Allergen Reactions  . Nardil Other (See Comments)    Patient is no allergic too this medication, entered in error  . Penicillins     Objective:   Physical Exam  Filed Vitals:   06/06/11 1035  Height: 5' 3.5" (1.613 m)  Weight: 133 lb 6.4 oz (60.51 kg)    Alert and oriented. Skin warm and dry. Oral mucosa is moist.   . Sclera anicteric, conjunctivae is pink. Thyroid not enlarged. No cervical lymphadenopathy. Lungs clear. Heart regular rate and rhythm.  Abdomen is soft. Bowel sounds are positive. No hepatomegaly. No abdominal masses felt. No tenderness.  No edema to lower extremities. Patient is alert and oriented. Stool brown and guaiac  negative.     Assessment:  Bright red rectal bleeding resolved. Possible hemorrhoidal. I discussed this case with Dr. Karilyn Cota. She was guaiac negative today in the office.    Plan:    If any further rectal bleeding, Dr. Karilyn Cota will proceed with a colonoscopy given family hx of colon cancer.

## 2011-06-20 ENCOUNTER — Ambulatory Visit (INDEPENDENT_AMBULATORY_CARE_PROVIDER_SITE_OTHER): Payer: Medicare Other | Admitting: Internal Medicine

## 2011-06-20 ENCOUNTER — Encounter (INDEPENDENT_AMBULATORY_CARE_PROVIDER_SITE_OTHER): Payer: Self-pay | Admitting: Internal Medicine

## 2011-06-20 VITALS — BP 98/60 | HR 108 | Temp 97.5°F | Ht 63.0 in | Wt 132.2 lb

## 2011-06-20 DIAGNOSIS — K921 Melena: Secondary | ICD-10-CM

## 2011-06-20 MED ORDER — HYDROCORTISONE ACETATE 25 MG RE SUPP: 25.0000 mg | Freq: Every day | RECTAL | Status: AC

## 2011-06-20 NOTE — Patient Instructions (Signed)
Keep record of bleeding episodes for the next 3 months. Use Anusol-HC suppository per rectum daily.

## 2011-06-20 NOTE — Progress Notes (Signed)
Presenting complaint;  Rectal bleeding.  Subjective:  Erica [name] is a 78 year old Caucasian female who is here about 2 weeks ago because of hematochezia. She was seen by Ms. Leandra Kern NP. Rectal examination was normal and her stool is guaiac negative. She has history of irritable bowel syndrome over the last 3 weeks her bowels been normal without diarrhea and/or constipation. Yesterday she had another episode of rectal bleeding with her bowel movements but he was bright red blood. She said some blood on the tissue from me to look at. She denies anorectal or abdominal pain. She has very good appetite. Her last colonoscopy was in October 2011 with removal of 2 small hyperplastic polyps and she had external hemorrhoids. She she stopped Pantoprazole. and not having heartburn anymore.  Current Medications: Current Outpatient Prescriptions  Medication Sig Dispense Refill  . bifidobacterium infantis (ALIGN) capsule Take 1 capsule by mouth daily.      . Cyanocobalamin (VITAMIN B-12) 1000 MCG SUBL Place under the tongue daily.      Marland Kitchen levothyroxine (SYNTHROID) 75 MCG tablet Take 75 mcg by mouth daily.      . pantoprazole (PROTONIX) 40 MG tablet Take 1 tablet (40 mg total) by mouth daily.  30 tablet  5  . phenelzine (NARDIL) 15 MG tablet Take 15 mg by mouth 2 (two) times daily.         Objective: Blood pressure 98/60, pulse 108, temperature 97.5 F (36.4 C), height 5\' 3"  (1.6 m), weight 132 lb 3.2 oz (59.966 kg). Conjunctiva is pink. Sclera is nonicteric Oropharyngeal mucosa is normal. No neck masses or thyromegaly noted. Abdomen is symmetrical, soft and nontender without organomegaly or masses. Rectal examination deferred as she had one 2 weeks ago. No LE edema or clubbing noted.   Assessment:  Hematochezia. She has had 2 episodes of hematochezia in the last 2 weeks. She is having normal stools. Her last colonoscopy was in October 2011 as above. Suspect hematochezia secondary to hemorrhoids.  If it becomes chronic we need to reexamine her colon.   Plan:  Anusol HC suppository 1 per rectum daily at bedtime for 2 week. Patient advised to keep a written record of bleeding episodes and let us know in 3 months. Office visit on an as-needed basis.

## 2011-07-06 ENCOUNTER — Other Ambulatory Visit (HOSPITAL_COMMUNITY): Payer: Self-pay | Admitting: Internal Medicine

## 2011-07-06 DIAGNOSIS — Z139 Encounter for screening, unspecified: Secondary | ICD-10-CM

## 2011-07-11 ENCOUNTER — Ambulatory Visit (HOSPITAL_COMMUNITY)
Admission: RE | Admit: 2011-07-11 | Discharge: 2011-07-11 | Disposition: A | Discharge status: Home / Self Care | Payer: Medicare Other | Source: Ambulatory Visit | Attending: Internal Medicine | Admitting: Internal Medicine

## 2011-07-11 DIAGNOSIS — Z1231 Encounter for screening mammogram for malignant neoplasm of breast: Secondary | ICD-10-CM | POA: Insufficient documentation

## 2011-07-11 DIAGNOSIS — Z139 Encounter for screening, unspecified: Secondary | ICD-10-CM

## 2011-07-28 ENCOUNTER — Ambulatory Visit (HOSPITAL_COMMUNITY)
Admission: RE | Admit: 2011-07-28 | Discharge: 2011-07-28 | Disposition: A | Discharge status: Home / Self Care | Payer: Medicare Other | Source: Ambulatory Visit | Attending: Internal Medicine | Admitting: Internal Medicine

## 2011-07-28 ENCOUNTER — Other Ambulatory Visit (HOSPITAL_COMMUNITY): Payer: Self-pay | Admitting: Internal Medicine

## 2011-07-28 DIAGNOSIS — M4180 Other forms of scoliosis, site unspecified: Secondary | ICD-10-CM | POA: Insufficient documentation

## 2011-07-28 DIAGNOSIS — M549 Dorsalgia, unspecified: Secondary | ICD-10-CM

## 2011-07-28 DIAGNOSIS — M545 Low back pain, unspecified: Secondary | ICD-10-CM | POA: Insufficient documentation

## 2011-07-28 DIAGNOSIS — M47817 Spondylosis without myelopathy or radiculopathy, lumbosacral region: Secondary | ICD-10-CM | POA: Insufficient documentation

## 2011-09-09 ENCOUNTER — Encounter (INDEPENDENT_AMBULATORY_CARE_PROVIDER_SITE_OTHER): Payer: Self-pay

## 2011-10-04 ENCOUNTER — Encounter (INDEPENDENT_AMBULATORY_CARE_PROVIDER_SITE_OTHER): Payer: Self-pay | Admitting: Internal Medicine

## 2011-10-04 ENCOUNTER — Ambulatory Visit (INDEPENDENT_AMBULATORY_CARE_PROVIDER_SITE_OTHER): Payer: Medicare Other | Admitting: Internal Medicine

## 2011-10-04 VITALS — BP 140/88 | HR 78 | Temp 97.9°F | Resp 20 | Ht 62.0 in | Wt 126.2 lb

## 2011-10-04 DIAGNOSIS — K649 Unspecified hemorrhoids: Secondary | ICD-10-CM

## 2011-10-04 DIAGNOSIS — K589 Irritable bowel syndrome without diarrhea: Secondary | ICD-10-CM

## 2011-10-04 DIAGNOSIS — R197 Diarrhea, unspecified: Secondary | ICD-10-CM

## 2011-10-04 MED ORDER — LOPERAMIDE HCL 2 MG PO TABS: 2.0000 mg | ORAL_TABLET | Freq: Every day | ORAL | Status: AC

## 2011-10-04 MED ORDER — HYDROCORTISONE ACETATE 25 MG RE SUPP: 25.0000 mg | Freq: Two times a day (BID) | RECTAL | Status: AC

## 2011-10-04 NOTE — Patient Instructions (Addendum)
Take align 1 capsule by mouth daily. Imodium OTC 2 mg by mouth every morning. Can take glycerin or Dulcolax suppository if he have no bowel movement for 2 days in a row. Use Anusol HC suppository per rectum at bedtime as needed

## 2011-10-04 NOTE — Progress Notes (Signed)
Presenting complaint;  Diarrhea, abdominal pain nausea and hematochezia.  Subjective:  Patient is 78 year old Caucasian female who called yesterday with acute symptoms. She has IBS and was last seen on 06/20/2011 for hematochezia felt to be secondary to hemorrhoids. Her last colonoscopy was on October 2011. She states she's been under a lot of stress on account of her husband's illness and increasing memory problems. Her symptoms started on July 4 when she was at [facility]. She developed bloating cramps and diarrhea and she has been on bland diet. However she is not feeling better. She also has noted drop in her appetite and has no energy. She has lost 6 pounds since her last visit. She states within 20 minutes of eating her breakfast and lunch she has to run to the bathroom and has explosive bowel movement. She generally does well after evening meal and does not have to go to the bathroom. Yesterday she noted bright red blood per rectum after second explosive bowel movement. She complains of cramps across her upper abdomen. While she has noted nausea but no vomiting fever melena or frank bleeding.  Current Medications: Current Outpatient Prescriptions  Medication Sig Dispense Refill  . Cyanocobalamin (VITAMIN B-12) 1000 MCG SUBL Place under the tongue daily.      Marland Kitchen levothyroxine (SYNTHROID) 75 MCG tablet Take 75 mcg by mouth daily.      . phenelzine (NARDIL) 15 MG tablet Take 15 mg by mouth 2 (two) times daily.      . bifidobacterium infantis (ALIGN) capsule Take 1 capsule by mouth daily.      . pantoprazole (PROTONIX) 40 MG tablet Take 1 tablet (40 mg total) by mouth daily.  30 tablet  5     Objective: Blood pressure 140/88, pulse 78, temperature 97.9 F (36.6 C), temperature source Oral, resp. rate 20, height 5\' 2"  (1.575 m), weight 126 lb 3.2 oz (57.244 kg). Patient is alert and in no acute distress. Conjunctiva is pink. Sclera is nonicteric Oropharyngeal mucosa is normal. No neck masses  or thyromegaly noted. Abdomen is full. Bowel sounds are normal. Abdomen is soft with mild tenderness at LUQ. No organomegaly or masses. No LE edema or clubbing noted.  Labs/studies Results: Lab data from Dr. Alonza Smoker office from 06/29/2011 WBC 4.1 H&H 13.5 and 44.1, platelet count 211K. Comprehensive chemistry panel within normal limits. TSH 1.329  Assessment:  Patient symptoms appear to be due to flareup of IBS. I doubt that she has intact infection since symptoms have been going on for more than 3 weeks. Hematochezia would appear to be secondary to hemorrhoids. She is up-to-date on her colonoscopies and I do not see this indication for one at this time.   Plan:  Patient reassured. Take align 1 capsule by mouth daily. Imodium 2 mg by mouth every morning. Use glycerin or Dulcolax suppository for constipation. Anusol-HC suppository 1 per rectum each bedtime when necessary. Office visit in 6 weeks.

## 2011-11-15 ENCOUNTER — Ambulatory Visit (INDEPENDENT_AMBULATORY_CARE_PROVIDER_SITE_OTHER): Payer: Medicare Other | Admitting: Internal Medicine

## 2011-11-22 ENCOUNTER — Encounter (INDEPENDENT_AMBULATORY_CARE_PROVIDER_SITE_OTHER): Payer: Self-pay | Admitting: Internal Medicine

## 2011-11-22 ENCOUNTER — Ambulatory Visit (INDEPENDENT_AMBULATORY_CARE_PROVIDER_SITE_OTHER): Payer: Medicare Other | Admitting: Internal Medicine

## 2011-11-22 VITALS — BP 128/78 | HR 80 | Temp 97.3°F | Resp 20 | Ht 62.0 in | Wt 127.3 lb

## 2011-11-22 DIAGNOSIS — K589 Irritable bowel syndrome without diarrhea: Secondary | ICD-10-CM

## 2011-11-22 DIAGNOSIS — K921 Melena: Secondary | ICD-10-CM

## 2011-11-22 NOTE — Patient Instructions (Signed)
Call if symptoms relapse. 

## 2011-11-22 NOTE — Progress Notes (Signed)
Presenting complaint;  Followup for diarrhea and hematochezia.  Subjective:  Patient is 78 year old Caucasian female who was last seen 10/04/2011 for diarrhea with urgency, epigastric pain and nausea. She was also complaining of intermittent hematochezia. She was begun on Align and given 2 weeks course of Anusol HC suppository and advised to take Imodium OTC every morning. Her nausea has resolved. She has not passed any blood per him since completing therapy with suppositories. She has had very few days with diarrhea. She is having formed stool every morning. She is using Imodium on as-needed basis. She is concerned that she will become constipated with daily use. She complains of muscle pain in her lower extremities. Is being followed by Dr. Kellie Simmering was concerned that she may have rheumatoid arthritis. For now her course is being monitored.  Current Medications: Current Outpatient Prescriptions  Medication Sig Dispense Refill  . amLODipine (NORVASC) 5 MG tablet Take 5 mg by mouth at bedtime.       . bifidobacterium infantis (ALIGN) capsule Take 1 capsule by mouth daily.      . Cyanocobalamin (VITAMIN B-12) 1000 MCG SUBL Place under the tongue daily.      Marland Kitchen levothyroxine (SYNTHROID) 75 MCG tablet Take 75 mcg by mouth daily.      . pantoprazole (PROTONIX) 40 MG tablet Take 1 tablet (40 mg total) by mouth daily.  30 tablet  5  . phenelzine (NARDIL) 15 MG tablet Take 15 mg by mouth 2 (two) times daily.         Objective: Blood pressure 128/78, pulse 80, temperature 97.3 F (36.3 C), temperature source Oral, resp. rate 20, height 5\' 2"  (1.575 m), weight 127 lb 4.8 oz (57.743 kg). Patient is alert and in no acute distress. Conjunctiva is pink. Sclera is nonicteric Oropharyngeal mucosa is normal. No neck masses or thyromegaly noted. Abdomen. Bowel sounds are normal. Abdomen is soft and nontender without organomegaly or masses. No LE edema or clubbing noted.   Assessment;  #1. Irritable  bowel syndrome. She is doing much better with current therapy. She should continue Align indefinitely. #2. Hematochezia resolved with therapy for hemorrhoids. #3. Family history of colon carcinoma (mother). Last colonoscopy was in October 2012 and next one will be due in October 2017.   Plan:  Continue current therapy. Use Imodium OTC and glycerin or Dulcolax suppository on as-needed basis. Office visit in one year.

## 2012-01-02 ENCOUNTER — Encounter (INDEPENDENT_AMBULATORY_CARE_PROVIDER_SITE_OTHER): Payer: Self-pay

## 2012-05-01 ENCOUNTER — Ambulatory Visit (INDEPENDENT_AMBULATORY_CARE_PROVIDER_SITE_OTHER): Payer: Medicare Other | Admitting: Internal Medicine

## 2012-05-01 ENCOUNTER — Encounter (INDEPENDENT_AMBULATORY_CARE_PROVIDER_SITE_OTHER): Payer: Self-pay | Admitting: Internal Medicine

## 2012-05-01 VITALS — BP 100/70 | HR 105 | Temp 98.1°F | Ht 63.0 in | Wt 128.5 lb

## 2012-05-01 DIAGNOSIS — K589 Irritable bowel syndrome without diarrhea: Secondary | ICD-10-CM

## 2012-05-01 NOTE — Progress Notes (Signed)
Subjective:     Patient ID: Erica [name], female   DOB: 04-01-1932, 79 y.o.   MRN: [id number]  HPI Prsents today with c/o of IBS-Diarrhea. She tells me she is under a lot stress. She tells me the Imodium is not working.  She took an Imodium yesterday. She had a hard BM this am.  She tells me she is drinking a lot of Pedilyte because she felt dehydrated. She has no energy. She has anxiety because of the possibility of diarrhea. When she eats sometimes her stomach hurts. She says today she is constipated. She avoids mayonnaise to prevent constipation. She tries to eat as healthy as she can.    She is up-to-date on her colonoscopies her last exam was in October 2011.   Review of Systems Current Outpatient Prescriptions  Medication Sig Dispense Refill  . bifidobacterium infantis (ALIGN) capsule Take 1 capsule by mouth daily.      . Cyanocobalamin (VITAMIN B-12) 1000 MCG SUBL Place under the tongue daily.      Marland Kitchen levothyroxine (SYNTHROID) 75 MCG tablet Take 75 mcg by mouth daily.      . phenelzine (NARDIL) 15 MG tablet Take 15 mg by mouth 2 (two) times daily.      . pantoprazole (PROTONIX) 40 MG tablet Take 1 tablet (40 mg total) by mouth daily.  30 tablet  5   No current facility-administered medications for this visit.   Past Medical History  Diagnosis Date  . Irritable bowel syndrome   . Thyroid condition    Past Surgical History  Procedure Laterality Date  . Colostomy    . Abdominal hysterectomy    . Colonoscopy    . Upper gastrointestinal endoscopy     Allergies  Allergen Reactions  . Nardil Other (See Comments)    Patient is no allergic too this medication, entered in error  . Penicillins        Objective:   Physical Exam  Filed Vitals:   05/01/12 0927  BP: 100/70  Pulse: 105  Temp: 98.1 F (36.7 C)  Height: 5\' 3"  (1.6 m)  Weight: 128 lb 8 oz (58.287 kg)   Alert and oriented. Skin warm and dry. Oral mucosa is moist.   . Sclera anicteric, conjunctivae is pink.  Thyroid not enlarged. No cervical lymphadenopathy. Lungs clear. Heart regular rate and rhythm.  Abdomen is soft. Bowel sounds are positive. No hepatomegaly. No abdominal masses felt. No tenderness.  No edema to lower extremities.       Assessment:    IBS: diarrhea. Discussed with Dr. Karilyn Cota.     Plan:    Take Imodium BID if need. Metamucil as needed. OV prn.

## 2012-05-01 NOTE — Patient Instructions (Addendum)
Imodium BID as needed. Metamucil

## 2012-05-02 ENCOUNTER — Telehealth (INDEPENDENT_AMBULATORY_CARE_PROVIDER_SITE_OTHER): Payer: Self-pay | Admitting: *Deleted

## 2012-05-02 DIAGNOSIS — K625 Hemorrhage of anus and rectum: Secondary | ICD-10-CM

## 2012-05-02 LAB — HEMOGLOBIN AND HEMATOCRIT, BLOOD
HCT: 41.8 % (ref 36.0–46.0)
Hemoglobin: 14.1 g/dL (ref 12.0–15.0)

## 2012-05-02 NOTE — Telephone Encounter (Signed)
Dorene Ar, NP called Erica [name] to have Erica [name] call Erica [name] back, after telling her to go to the ED, to go get a H&H done. (lab work) [name] was given a note to put the orders into the computer.

## 2012-05-02 NOTE — Telephone Encounter (Signed)
Gardiner Ramus LM stating she has been up all night with abd pain and bleeding through the rectum all night. Per Dorene Ar, NP, the patient should go to the ED. When I spoke with Dajha this am, she is said she is only bleeding when she goes tries to have a BM.  After supper last night she started having excruciating gas pains and is afraid of eating. Spoke with Dorene Ar, NP again and was told to have patient go to the ED. Amerika Nourse of what Camelia Eng said and voices understood.

## 2012-05-02 NOTE — Telephone Encounter (Signed)
Per.Dr.Rehman the patient will need to have H/H

## 2012-05-03 ENCOUNTER — Encounter (HOSPITAL_COMMUNITY): Payer: Self-pay | Admitting: Emergency Medicine

## 2012-05-03 ENCOUNTER — Telehealth (INDEPENDENT_AMBULATORY_CARE_PROVIDER_SITE_OTHER): Payer: Self-pay | Admitting: Internal Medicine

## 2012-05-03 ENCOUNTER — Telehealth (INDEPENDENT_AMBULATORY_CARE_PROVIDER_SITE_OTHER): Payer: Self-pay | Admitting: *Deleted

## 2012-05-03 ENCOUNTER — Emergency Department (HOSPITAL_COMMUNITY)
Admission: EM | Admit: 2012-05-03 | Discharge: 2012-05-03 | Disposition: A | Discharge status: Home / Self Care | Payer: Medicare Other | Attending: Emergency Medicine | Admitting: Emergency Medicine

## 2012-05-03 DIAGNOSIS — K625 Hemorrhage of anus and rectum: Secondary | ICD-10-CM | POA: Insufficient documentation

## 2012-05-03 DIAGNOSIS — Z8719 Personal history of other diseases of the digestive system: Secondary | ICD-10-CM | POA: Insufficient documentation

## 2012-05-03 DIAGNOSIS — F3289 Other specified depressive episodes: Secondary | ICD-10-CM | POA: Insufficient documentation

## 2012-05-03 DIAGNOSIS — R1032 Left lower quadrant pain: Secondary | ICD-10-CM | POA: Insufficient documentation

## 2012-05-03 DIAGNOSIS — Z79899 Other long term (current) drug therapy: Secondary | ICD-10-CM | POA: Insufficient documentation

## 2012-05-03 DIAGNOSIS — Z87891 Personal history of nicotine dependence: Secondary | ICD-10-CM | POA: Insufficient documentation

## 2012-05-03 DIAGNOSIS — R197 Diarrhea, unspecified: Secondary | ICD-10-CM | POA: Insufficient documentation

## 2012-05-03 DIAGNOSIS — E079 Disorder of thyroid, unspecified: Secondary | ICD-10-CM | POA: Insufficient documentation

## 2012-05-03 DIAGNOSIS — F329 Major depressive disorder, single episode, unspecified: Secondary | ICD-10-CM | POA: Insufficient documentation

## 2012-05-03 HISTORY — DX: Depression, unspecified: F32.A

## 2012-05-03 LAB — CBC WITH DIFFERENTIAL/PLATELET
Basophils Absolute: 0 10*3/uL (ref 0.0–0.1)
Basophils Relative: 0 % (ref 0–1)
Eosinophils Absolute: 0 10*3/uL (ref 0.0–0.7)
Eosinophils Relative: 0 % (ref 0–5)
HCT: 39.6 % (ref 36.0–46.0)
Hemoglobin: 13.1 g/dL (ref 12.0–15.0)
Lymphocytes Relative: 12 % (ref 12–46)
Lymphs Abs: 1.7 10*3/uL (ref 0.7–4.0)
MCH: 31.8 pg (ref 26.0–34.0)
MCHC: 33.1 g/dL (ref 30.0–36.0)
MCV: 96.1 fL (ref 78.0–100.0)
Monocytes Absolute: 1.7 10*3/uL — ABNORMAL HIGH (ref 0.1–1.0)
Monocytes Relative: 12 % (ref 3–12)
Neutro Abs: 10.7 10*3/uL — ABNORMAL HIGH (ref 1.7–7.7)
Neutrophils Relative %: 76 % (ref 43–77)
Platelets: 169 10*3/uL (ref 150–400)
RBC: 4.12 MIL/uL (ref 3.87–5.11)
RDW: 13.3 % (ref 11.5–15.5)
WBC: 14.1 10*3/uL — ABNORMAL HIGH (ref 4.0–10.5)

## 2012-05-03 LAB — BASIC METABOLIC PANEL
BUN: 12 mg/dL (ref 6–23)
CO2: 27 mEq/L (ref 19–32)
Calcium: 9 mg/dL (ref 8.4–10.5)
Chloride: 103 mEq/L (ref 96–112)
Creatinine, Ser: 0.78 mg/dL (ref 0.50–1.10)
GFR calc Af Amer: >90 mL/min (ref >90)
GFR calc non Af Amer: 79 mL/min — ABNORMAL LOW (ref >90)
Glucose, Bld: 115 mg/dL — ABNORMAL HIGH (ref 70–99)
Potassium: 4.4 mEq/L (ref 3.5–5.1)
Sodium: 137 mEq/L (ref 135–145)

## 2012-05-03 MED ORDER — SODIUM CHLORIDE 0.9 % IV BOLUS (SEPSIS)
500.0000 mL | Freq: Once | INTRAVENOUS | Status: AC
  Administered 2012-05-03: 500 mL via INTRAVENOUS

## 2012-05-03 NOTE — ED Notes (Signed)
Wheelchair offered and pt refused, pt insisted on ambulating out

## 2012-05-03 NOTE — ED Notes (Signed)
Pt c/o explosive rectal bleeding x 2 days ago that started out bright red. States every bm since has had blood in it and with clots. Color wnl. C/o severe "gas pains" with the episodes. Total of approx 5 episodes since started. hgb taken by dr Karilyn Cota yesterday which was normal

## 2012-05-03 NOTE — ED Notes (Signed)
Pt states with constipation on Tuesday, able to pass hard stool with rectal pain, had large dark blood clot pass and since with blood noted in stool, 2 BM's today per pt, was instructed by PCP Erica [name] and GI Erica [name] to come here, states HBG was checked yesterday and normal

## 2012-05-03 NOTE — ED Provider Notes (Signed)
History  This chart was scribed for Raeford Razor, MD, by Candelaria Stagers, ED Scribe. This patient was seen in room [facility]07[facility] and the patient's care was started at 12:33 PM   CSN: [id number]  Arrival date & time 05/03/12  1113   First MD Initiated Contact with Patient 05/03/12 1147      Chief Complaint  Patient presents with  . Rectal Bleeding     The history is provided by the patient. No language interpreter was used.   AHNA KONKLE is a 79 y.o. female who presents to the Emergency Department complaining of rectal bleeding that started two days ago.  She reports she experienced diarrhea at onset along with abdominal pain and cramping.  Pt reports she has experienced bright red blood in stools after eating over the last two days.  Pt reports she has seen a dark red blood clot with one bowel movement.  Pt had her hemoglobin checked yesterday which she reports was normal.  She has used a heating pad for the pain with little relief.  She has experienced bloody stool in the past associated with hemorrhoids. She denies new dizziness.  Pt has h/o IBS which she reports has been worse over the last six months.  She is currently taking synthroid, align, and nardil.  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  She has family h/o of mother with colon cancer and gets regular colonoscopies every five years which have been normal.  Her next colonoscopy is scheduled for 2016.  She denies use of blood thinners.            Past Medical History  Diagnosis Date  . Irritable bowel syndrome   . Thyroid condition   . Depression     Past Surgical History  Procedure Laterality Date  . Colostomy    . Abdominal hysterectomy    . Colonoscopy    . Upper gastrointestinal endoscopy      Family History  Problem Relation Age of Onset  . Colon cancer Mother   . Atrial fibrillation Son     History  Substance Use Topics  . Smoking status: Former Smoker    Types:  Cigarettes    Quit date: 04/18/1981  . Smokeless tobacco: Never Used  . Alcohol Use: No    OB History   Grav Para Term Preterm Abortions TAB SAB Ect Mult Living                  Review of Systems  Gastrointestinal: Positive for abdominal pain and blood in stool.  All other systems reviewed and are negative.    Allergies  Nardil and Penicillins  Home Medications   Current Outpatient Rx  Name  Route  Sig  Dispense  Refill  . bifidobacterium infantis (ALIGN) capsule   Oral   Take 1 capsule by mouth daily.         . Cyanocobalamin (VITAMIN B-12) 1000 MCG SUBL   Sublingual   Place under the tongue daily.         Marland Kitchen levothyroxine (SYNTHROID) 75 MCG tablet   Oral   Take 75 mcg by mouth daily.         Marland Kitchen EXPIRED: pantoprazole (PROTONIX) 40 MG tablet   Oral   Take 1 tablet (40 mg total) by mouth daily.   30 tablet   5   . phenelzine (NARDIL) 15 MG tablet   Oral   Take 15 mg by mouth 2 (two)  times daily.           BP 132/72  Pulse 101  Temp(Src) 97.8 F (36.6 C) (Oral)  Resp 18  Ht 5\' 3"  (1.6 m)  Wt 128 lb (58.06 kg)  BMI 22.68 kg/m2  SpO2 95%  Physical Exam  Nursing note and vitals reviewed. Constitutional: She is oriented to person, place, and time. She appears well-developed and well-nourished. No distress.  HENT:  Head: Normocephalic and atraumatic.  Eyes: Conjunctivae and EOM are normal.  Neck: Neck supple. No tracheal deviation present.  Cardiovascular: Regular rhythm.   Tachycardic   Pulmonary/Chest: Effort normal. No respiratory distress.  Abdominal: Soft. She exhibits no distension. There is tenderness (LLQ tenderness). There is no rebound and no guarding.  Well healed abdominal surgical scars.   Genitourinary:  Small amount of gross blood noted on glove.  Normal tone.  Skin tags. No internal or external hemorrhoids noted. No tenderness.   Musculoskeletal: Normal range of motion.  Neurological: She is alert and oriented to person, place,  and time.  Skin: Skin is dry.  Psychiatric: She has a normal mood and affect. Her behavior is normal.    ED Course  Procedures   DIAGNOSTIC STUDIES: Oxygen Saturation is 95% on room air, normal by my interpretation.    COORDINATION OF CARE:  12:38 PM Discussed plan with pt at bedside which includes rectal exam and blood work.    Pt understands and agrees with plan.  Pt denies pain medication.    Labs Reviewed  CBC WITH DIFFERENTIAL - Abnormal; Notable for the following:    WBC 14.1 (*)    Neutro Abs 10.7 (*)    Monocytes Absolute 1.7 (*)    All other components within normal limits  BASIC METABOLIC PANEL - Abnormal; Notable for the following:    Glucose, Bld 115 (*)    GFR calc non Af Amer 79 (*)    All other components within normal limits   No results found.  EKG:  Rhythm: sinus tachycardia Vent. rate 124 BPM PR interval 144 ms QRS duration 82 ms QT/QTc 304/436 ms ST segments: NS ST changes   1. Rectal bleeding       MDM  79yF with rectal bleeding. Tachycardic on arrival but resolved with only 500cc of IVF. Consider 2/2 blood loss, but doubt. Pt reports hx of "fast heart rate." Hemoglobin is normal several days after onset of bleeding. Not on blood thinners. BP fine. No dizziness, lightheaded, SOB or having other symptoms attributable to possible significant blood loss. I feel she is safe for DC at this time. She is already established with GI. Understands the need for prompt follow-up. Emergent return precautions discussed.   I personally preformed the services scribed in my presence. The recorded information has been reviewed is accurate. Raeford Razor, MD.        Raeford Razor, MD 05/03/12 [id number]

## 2012-05-03 NOTE — Telephone Encounter (Signed)
Erica [name] called to get her labs results.  The return phone number is [phone].

## 2012-05-03 NOTE — Telephone Encounter (Signed)
Erica [name] apparently called Dr. Alonza Smoker office this morning. She is still having rectal bleeding and a lot of lower abdominal pain. Dr. Ouida Sills wants her to go to the ED. I agree she needs to go to the ED. FYI: At this, her H and H is not in my in-basis. I was advised by Alden Server that she received a hard copy. Her H and H were normal. I will let Dr. Karilyn Cota know

## 2012-05-03 NOTE — Telephone Encounter (Signed)
Erica [name] called to let Dr. Karilyn Cota know she went to the ED today, 05/03/12, and was told to f/u with Dr. Karilyn Cota that she will need to have a TCS next week. Spoke with Dr. Karilyn Cota and he advised me to call Erica [name] to advise her to stay on a bland diet. Tell Ann to schedule Erica [name] a TCS for next wee. Called patient to advised what Dr. Karilyn Cota said and voices understood.

## 2012-05-03 NOTE — Telephone Encounter (Signed)
Results of lab given to [facility]. She says she is still passing blood (not a lot).  She says she is doubled over with lower abdominal pain, She is going to to ED per Dr. Ouida Sills and Ursula Beath

## 2012-05-03 NOTE — Telephone Encounter (Signed)
Erica [name] called to get her labs results.  The return phone number is [phone]. 

## 2012-05-04 ENCOUNTER — Encounter (HOSPITAL_COMMUNITY): Payer: Self-pay | Admitting: Pharmacy Technician

## 2012-05-04 ENCOUNTER — Encounter (INDEPENDENT_AMBULATORY_CARE_PROVIDER_SITE_OTHER): Payer: Self-pay | Admitting: *Deleted

## 2012-05-04 ENCOUNTER — Other Ambulatory Visit (INDEPENDENT_AMBULATORY_CARE_PROVIDER_SITE_OTHER): Payer: Self-pay | Admitting: *Deleted

## 2012-05-04 DIAGNOSIS — K625 Hemorrhage of anus and rectum: Secondary | ICD-10-CM

## 2012-05-04 NOTE — Telephone Encounter (Signed)
TCS sch'd 05/08/12 at 730, patient aware

## 2012-05-07 MED ORDER — SODIUM CHLORIDE 0.45 % IV SOLN: INTRAVENOUS | Status: DC

## 2012-05-08 ENCOUNTER — Ambulatory Visit (HOSPITAL_COMMUNITY)
Admission: RE | Admit: 2012-05-08 | Discharge: 2012-05-08 | Disposition: A | Discharge status: Home / Self Care | Payer: Medicare Other | Source: Ambulatory Visit | Attending: Internal Medicine | Admitting: Internal Medicine

## 2012-05-08 ENCOUNTER — Encounter (HOSPITAL_COMMUNITY): Payer: Self-pay

## 2012-05-08 ENCOUNTER — Encounter (HOSPITAL_COMMUNITY)
Admission: RE | Disposition: A | Discharge status: Home / Self Care | Payer: Self-pay | Source: Ambulatory Visit | Attending: Internal Medicine

## 2012-05-08 DIAGNOSIS — K644 Residual hemorrhoidal skin tags: Secondary | ICD-10-CM | POA: Insufficient documentation

## 2012-05-08 DIAGNOSIS — R1032 Left lower quadrant pain: Secondary | ICD-10-CM | POA: Insufficient documentation

## 2012-05-08 DIAGNOSIS — K625 Hemorrhage of anus and rectum: Secondary | ICD-10-CM | POA: Insufficient documentation

## 2012-05-08 DIAGNOSIS — K5289 Other specified noninfective gastroenteritis and colitis: Secondary | ICD-10-CM | POA: Insufficient documentation

## 2012-05-08 DIAGNOSIS — K5730 Diverticulosis of large intestine without perforation or abscess without bleeding: Secondary | ICD-10-CM

## 2012-05-08 DIAGNOSIS — Z800 Family history of malignant neoplasm of digestive organs: Secondary | ICD-10-CM | POA: Insufficient documentation

## 2012-05-08 DIAGNOSIS — K5150 Left sided colitis without complications: Secondary | ICD-10-CM

## 2012-05-08 HISTORY — PX: COLONOSCOPY: SHX5424

## 2012-05-08 SURGERY — COLONOSCOPY
Anesthesia: Moderate Sedation

## 2012-05-08 MED ORDER — MIDAZOLAM HCL 5 MG/5ML IJ SOLN
INTRAMUSCULAR | Status: DC | PRN
  Administered 2012-05-08: 1 mg via INTRAVENOUS
  Administered 2012-05-08 (×2): 2 mg via INTRAVENOUS
  Administered 2012-05-08: 1 mg via INTRAVENOUS

## 2012-05-08 MED ORDER — FENTANYL CITRATE 0.05 MG/ML IJ SOLN
INTRAMUSCULAR | Status: AC
  Filled 2012-05-08: qty 2

## 2012-05-08 MED ORDER — FENTANYL CITRATE 0.05 MG/ML IJ SOLN
INTRAMUSCULAR | Status: DC | PRN
  Administered 2012-05-08 (×2): 25 ug via INTRAVENOUS

## 2012-05-08 MED ORDER — MIDAZOLAM HCL 5 MG/5ML IJ SOLN
INTRAMUSCULAR | Status: AC
  Filled 2012-05-08: qty 10

## 2012-05-08 MED ORDER — STERILE WATER FOR IRRIGATION IR SOLN
Status: DC | PRN
  Administered 2012-05-08: 08:00:00

## 2012-05-08 NOTE — Op Note (Signed)
COLONOSCOPY PROCEDURE REPORT  PATIENT:  Erica [name]  MR#:  [id number] Birthdate:  11/19/1932, 79 y.o., female Endoscopist:  Dr. Malissa Hippo, MD Referred By:  Dr. Carylon Perches, MD Procedure Date: 05/08/2012  Procedure:   Colonoscopy  Indications: Patient is 79 year old Caucasian female with acute onset or rectal bleeding left-sided abdominal pain.  Informed Consent:  The procedure and risks were reviewed with the patient and informed consent was obtained.  Medications:  Fentanyl 50 mcg IV Versed 6 mg IV  Description of procedure:  After a digital rectal exam was performed, that colonoscope was advanced from the anus through the rectum and colon to the area of the cecum, ileocecal valve and appendiceal orifice. The cecum was deeply intubated. These structures were well-seen and photographed for the record. From the level of the cecum and ileocecal valve, the scope was slowly and cautiously withdrawn. The mucosal surfaces were carefully surveyed utilizing scope tip to flexion to facilitate fold flattening as needed. The scope was pulled down into the rectum where a thorough exam including retroflexion was performed.  Findings:   Prep satisfactory. Stool sample obtained for cultures and O&P. Colonic anastomosis located at 22 cm from anal margin and was wide open. Few small diverticula noted in the vicinity of the anastomosis. Patchy colitis with scattered ulcers in patchy erythema and edema involving descending colon. Biopsies taken on the way out. Mucosa proximal to splenic flexure was normal. Normal rectal mucosa. Small hemorrhoids below the dentate line.   Therapeutic/Diagnostic Maneuvers Performed:  See above  Complications:  None  Cecal Withdrawal Time:  7 minutes  Impression:  Examination performed to cecum. Left-sided colitis most likely ischemic in origin. Stool samples obtained for culture and O&P. Wide-open colonic anastomosis with few diverticula in the  vicinity. Small external hemorrhoids.  Recommendations:  Standard instructions given. I will contact patient with biopsy results and further recommendations.  [name]  05/08/2012 8:34 AM  CC: Dr. Carylon Perches, MD & Dr. Bonnetta Barry ref. provider found

## 2012-05-08 NOTE — H&P (Signed)
Erica [name] is an 79 y.o. female.   Chief Complaint: Patient is here for colonoscopy. HPI: Patient is 79 year old Caucasian female who presents with history of rectal bleeding which occurred last week lasting for one day. She had 7-8 bowel movements each time she passed blood per rectum stool. She also complains of left-sided abdominal pain. She denies fever or recent use of antibiotics. She has known IBS. Last colonoscopy was October 2011 with removal of on polyp which was hyperplastic. Random biopsies from sigmoid colon were normal. Family history is significant for colon carcinoma in her mother at age 18 died of metastatic disease at 55. Two maternal  uncles also had colon cancer along with maternal grandfather.  Past Medical History  Diagnosis Date  . Irritable bowel syndrome   . Thyroid condition   . Depression     Past Surgical History  Procedure Laterality Date  . Colostomy    . Abdominal hysterectomy    . Colonoscopy    . Upper gastrointestinal endoscopy    . Eye surgery  cataract x 2    Family History  Problem Relation Age of Onset  . Colon cancer Mother   . Atrial fibrillation Son    Social History:  reports that she quit smoking about 31 years ago. Her smoking use included Cigarettes. She smoked 0.00 packs per day. She has never used smokeless tobacco. She reports that she does not drink alcohol or use illicit drugs.  Allergies:  Allergies  Allergen Reactions  . Penicillins     Medications Prior to Admission  Medication Sig Dispense Refill  . bifidobacterium infantis (ALIGN) capsule Take 1 capsule by mouth daily.      . Cyanocobalamin (VITAMIN B-12) 1000 MCG SUBL Place under the tongue daily.      Marland Kitchen levothyroxine (SYNTHROID) 75 MCG tablet Take 75 mcg by mouth daily.      . phenelzine (NARDIL) 15 MG tablet Take 15 mg by mouth 2 (two) times daily.        No results found for this or any previous visit (from the past 48 hour(s)). No results  found.  ROS  Blood pressure 150/84, pulse 90, temperature 98.1 F (36.7 C), temperature source Oral, resp. rate 16, height 5\' 3"  (1.6 m), weight 128 lb (58.06 kg), SpO2 96.00%. Physical Exam  Constitutional: She appears well-developed and well-nourished.  HENT:  Mouth/Throat: Oropharynx is clear and moist.  Eyes: Conjunctivae are normal.  Neck: No thyromegaly present.  Cardiovascular: Normal rate, regular rhythm and normal heart sounds.   No murmur heard. Respiratory: Effort normal.  GI: Soft. She exhibits no distension and no mass. Tenderness: mild tenderness at LLQ.  Musculoskeletal: She exhibits no edema.  Lymphadenopathy:    She has no cervical adenopathy.  Neurological: She is alert.  Skin: Skin is warm and dry.     Assessment/Plan Rectal bleeding. Family history of colon carcinoma. Diagnostic colonoscopy.  Barney Russomanno U 05/08/2012, 7:32 AM

## 2012-05-09 LAB — OVA AND PARASITE EXAMINATION: Ova and parasites: NONE SEEN

## 2012-05-10 ENCOUNTER — Encounter (HOSPITAL_COMMUNITY): Payer: Self-pay | Admitting: Internal Medicine

## 2012-05-12 LAB — STOOL CULTURE

## 2012-05-15 ENCOUNTER — Encounter (INDEPENDENT_AMBULATORY_CARE_PROVIDER_SITE_OTHER): Payer: Self-pay | Admitting: *Deleted

## 2012-08-23 ENCOUNTER — Encounter (HOSPITAL_COMMUNITY): Payer: Self-pay | Admitting: *Deleted

## 2012-08-23 ENCOUNTER — Emergency Department (HOSPITAL_COMMUNITY)
Admission: EM | Admit: 2012-08-23 | Discharge: 2012-08-23 | Disposition: A | Discharge status: Home / Self Care | Payer: Medicare Other | Attending: Emergency Medicine | Admitting: Emergency Medicine

## 2012-08-23 ENCOUNTER — Emergency Department (HOSPITAL_COMMUNITY): Payer: Medicare Other

## 2012-08-23 DIAGNOSIS — Z79899 Other long term (current) drug therapy: Secondary | ICD-10-CM | POA: Insufficient documentation

## 2012-08-23 DIAGNOSIS — Z87891 Personal history of nicotine dependence: Secondary | ICD-10-CM | POA: Insufficient documentation

## 2012-08-23 DIAGNOSIS — Y9389 Activity, other specified: Secondary | ICD-10-CM | POA: Insufficient documentation

## 2012-08-23 DIAGNOSIS — Z23 Encounter for immunization: Secondary | ICD-10-CM | POA: Insufficient documentation

## 2012-08-23 DIAGNOSIS — S81811A Laceration without foreign body, right lower leg, initial encounter: Secondary | ICD-10-CM

## 2012-08-23 DIAGNOSIS — Y929 Unspecified place or not applicable: Secondary | ICD-10-CM | POA: Insufficient documentation

## 2012-08-23 DIAGNOSIS — F3289 Other specified depressive episodes: Secondary | ICD-10-CM | POA: Insufficient documentation

## 2012-08-23 DIAGNOSIS — E079 Disorder of thyroid, unspecified: Secondary | ICD-10-CM | POA: Insufficient documentation

## 2012-08-23 DIAGNOSIS — F329 Major depressive disorder, single episode, unspecified: Secondary | ICD-10-CM | POA: Insufficient documentation

## 2012-08-23 DIAGNOSIS — Z880 Allergy status to penicillin: Secondary | ICD-10-CM | POA: Insufficient documentation

## 2012-08-23 DIAGNOSIS — Z8719 Personal history of other diseases of the digestive system: Secondary | ICD-10-CM | POA: Insufficient documentation

## 2012-08-23 DIAGNOSIS — S81009A Unspecified open wound, unspecified knee, initial encounter: Secondary | ICD-10-CM | POA: Insufficient documentation

## 2012-08-23 DIAGNOSIS — W1809XA Striking against other object with subsequent fall, initial encounter: Secondary | ICD-10-CM | POA: Insufficient documentation

## 2012-08-23 MED ORDER — TETANUS-DIPHTH-ACELL PERTUSSIS 5-2.5-18.5 LF-MCG/0.5 IM SUSP
INTRAMUSCULAR | Status: AC
  Administered 2012-08-23: 0.5 mL via INTRAMUSCULAR
  Filled 2012-08-23: qty 0.5

## 2012-08-23 NOTE — ED Provider Notes (Addendum)
History     CSN: [id number]  Arrival date & time 08/23/12  1125   First MD Initiated Contact with Patient 08/23/12 1231      Chief Complaint  Patient presents with  . Extremity Laceration    (Consider location/radiation/quality/duration/timing/severity/associated sxs/prior treatment) HPI Comments: Laceration to right shin from shattered flowerpots. Bleeding controlled. No use of blood thinners. No weakness, numbness or tingling. Tetanus not up-to-date.  The history is provided by the patient.    Past Medical History  Diagnosis Date  . Irritable bowel syndrome   . Thyroid condition   . Depression     Past Surgical History  Procedure Laterality Date  . Colostomy    . Abdominal hysterectomy    . Colonoscopy    . Upper gastrointestinal endoscopy    . Eye surgery  cataract x 2  . Colonoscopy N/A 05/08/2012    Procedure: COLONOSCOPY;  Surgeon: Malissa Hippo, MD;  Location: [facility];  Service: Endoscopy;  Laterality: N/A;  730    Family History  Problem Relation Age of Onset  . Colon cancer Mother   . Atrial fibrillation Son     History  Substance Use Topics  . Smoking status: Former Smoker    Types: Cigarettes    Quit date: 04/18/1981  . Smokeless tobacco: Never Used  . Alcohol Use: No    OB History   Grav Para Term Preterm Abortions TAB SAB Ect Mult Living                  Review of Systems  HENT: Negative for congestion and rhinorrhea.   Gastrointestinal: Negative for nausea, vomiting and abdominal pain.  Genitourinary: Negative for vaginal discharge.  Musculoskeletal: Negative for back pain.  Skin: Positive for wound.  Neurological: Negative for dizziness, weakness and headaches.  A complete 10 system review of systems was obtained and all systems are negative except as noted in the HPI and PMH.    Allergies  Penicillins  Home Medications   Current Outpatient Rx  Name  Route  Sig  Dispense  Refill  . bifidobacterium infantis (ALIGN)  capsule   Oral   Take 1 capsule by mouth daily.         . Cyanocobalamin (VITAMIN B-12) 1000 MCG SUBL   Sublingual   Place under the tongue daily.         Marland Kitchen levothyroxine (SYNTHROID) 75 MCG tablet   Oral   Take 75 mcg by mouth daily.         . phenelzine (NARDIL) 15 MG tablet   Oral   Take 15 mg by mouth 2 (two) times daily.           BP 150/82  Pulse 93  Temp(Src) 98.1 F (36.7 C) (Oral)  Resp 19  SpO2 97%  Physical Exam  Constitutional: She is oriented to person, place, and time. She appears well-developed and well-nourished. No distress.  HENT:  Head: Normocephalic and atraumatic.  Mouth/Throat: Oropharynx is clear and moist. No oropharyngeal exudate.  Eyes: Conjunctivae and EOM are normal. Pupils are equal, round, and reactive to light.  Neck: Normal range of motion. Neck supple.  Cardiovascular: Normal rate, regular rhythm and normal heart sounds.   No murmur heard. Pulmonary/Chest: Breath sounds normal. No respiratory distress.  Abdominal: Soft. There is no tenderness. There is no rebound and no guarding.  Musculoskeletal: Normal range of motion. She exhibits no edema and no tenderness.  Neurological: She is alert and oriented to person,  place, and time. No cranial nerve deficit. She exhibits normal muscle tone. Coordination normal.  Skin: Skin is warm.  2 cm elliptical shaped laceration to right shin, more consistent with skin tear. Neurovascular intact distally    ED Course  LACERATION REPAIR Date/Time: 08/23/2012 4:23 PM Performed by: Glynn Octave Authorized by: Glynn Octave Consent: Verbal consent obtained. Risks and benefits: risks, benefits and alternatives were discussed Consent given by: patient Patient understanding: patient states understanding of the procedure being performed Patient identity confirmed: verbally with patient and provided demographic data Time out: Immediately prior to procedure a "time out" was called to verify the  correct patient, procedure, equipment, support staff and site/side marked as required. Body area: lower extremity Location details: right lower leg Laceration length: 3 cm Contamination: The wound is contaminated. Tendon involvement: none Nerve involvement: none Vascular damage: no Preparation: Patient was prepped and draped in the usual sterile fashion. Irrigation solution: saline Irrigation method: syringe Amount of cleaning: extensive Debridement: none Degree of undermining: none Skin closure: Steri-Strips Approximation: loose Approximation difficulty: simple Dressing: 4x4 sterile gauze Patient tolerance: Patient tolerated the procedure well with no immediate complications.   (including critical care time)  Labs Reviewed - No data to display Dg Tibia/fibula Right  08/23/2012   *RADIOLOGY REPORT*  Clinical Data: Extremity laceration.  RIGHT TIBIA AND FIBULA - 2 VIEW  Comparison: None.  Findings: Tibia and fibula intact.  In the anterior distal leg at the distal shaft, there appears to be the anterior soft tissue irregularity and a scattered cluster of tiny radiodense object, likely representing debris.  Pretibial soft tissue swelling extends cranially from this region.  IMPRESSION: No osseous injury.  Laceration and debris in the distal anterior leg.   Original Report Authenticated By: Andreas Newport, M.D.     1. Leg laceration, right, initial encounter       MDM  Laceration without neurovascular compromise. Bleeding controlled. Update tetanus.  Xray negative. Wound cleaned by nursing staff. Flap approximated and steri strips applied. Tetanus updated. Wound care instructions given.  BP 150/82  Pulse 93  Temp(Src) 98.1 F (36.7 C) (Oral)  Resp 19  SpO2 97%    Glynn Octave, MD 08/23/12 1624  Glynn Octave, MD 08/23/12 1625

## 2012-08-23 NOTE — ED Notes (Signed)
Pt with lac to right lower leg after a flower pot broke and hit her leg

## 2012-10-17 ENCOUNTER — Other Ambulatory Visit: Payer: Self-pay

## 2012-12-05 ENCOUNTER — Encounter: Payer: Self-pay | Admitting: Orthopedic Surgery

## 2012-12-05 ENCOUNTER — Ambulatory Visit (INDEPENDENT_AMBULATORY_CARE_PROVIDER_SITE_OTHER): Payer: Medicare Other

## 2012-12-05 ENCOUNTER — Ambulatory Visit (INDEPENDENT_AMBULATORY_CARE_PROVIDER_SITE_OTHER): Payer: Medicare Other | Admitting: Orthopedic Surgery

## 2012-12-05 VITALS — BP 111/72 | Ht 63.0 in | Wt 130.0 lb

## 2012-12-05 DIAGNOSIS — M67919 Unspecified disorder of synovium and tendon, unspecified shoulder: Secondary | ICD-10-CM

## 2012-12-05 DIAGNOSIS — M25519 Pain in unspecified shoulder: Secondary | ICD-10-CM

## 2012-12-05 DIAGNOSIS — M25511 Pain in right shoulder: Secondary | ICD-10-CM | POA: Insufficient documentation

## 2012-12-05 DIAGNOSIS — M75101 Unspecified rotator cuff tear or rupture of right shoulder, not specified as traumatic: Secondary | ICD-10-CM

## 2012-12-05 NOTE — Progress Notes (Signed)
Patient ID: Erica [name], female   DOB: 1933/02/13, 79 y.o.   MRN: [id number]  Chief Complaint  Patient presents with  . Arm Pain    Right arm  and shoulder pain,having difficulty raising arm and reaching behind back. No injury.    HISTORY: Today we have a 79 year old female who presents with a two-month history of right shoulder and arm pain radiating into her forearm. This is associated with overhead activity decreased internal rotation and painful abduction. She has difficulty reaching over her head. She has difficulty driving lifting any heavy objects. Her pain is 09/1008 to come and go and is described as throbbing. No treatment to date. Pain is relieved with over-the-counter Advil and heating pad. She does report sleeping difficulty as well related to pain.  Additional information she denies any trauma  She has a history of fatigue easy bruising musculoskeletal-related complaints as well history of rheumatoid arthritis  Her other systems were reviewed and were normal  Past Surgical History  Procedure Laterality Date  . Colostomy    . Abdominal hysterectomy    . Colonoscopy    . Upper gastrointestinal endoscopy    . Eye surgery  cataract x 2  . Colonoscopy N/A 05/08/2012    Procedure: COLONOSCOPY;  Surgeon: Malissa Hippo, MD;  Location: [facility];  Service: Endoscopy;  Laterality: N/A;  730     Vitals: BP 111/72  Ht 5\' 3"  (1.6 m)  Wt 130 lb (58.968 kg)  BMI 23.03 kg/m2 General normal appearance grooming hygiene Oriented x 3  Mood normal   Right shoulder  Inspection no specific areas of tenderness. Mild palpation tenderness around the lateral and posterior acromion ROM painful passive range of motion which is full in flexion and abduction pain and 100 abduction pain with internal rotation which is decreased Motor exam 5/5 MMT  Skin warm dry intact without rash  Shoulder stable negative horn blower sign positive impingement sign  CDV: normal pulse normal radial  and normal pulse  Sensation normal upper extremity  X-rays show osteopenia congruent shoulder joint Shenton's line of the shoulder intact mild joint space narrowing at the inferior margin of the glenohumeral joint a.c. joint arthritis  Encounter Diagnoses  Name Primary?  . Right shoulder pain Yes  . Rotator cuff syndrome of right shoulder     Shoulder Injection Procedure Note   Pre-operative Diagnosis: right  RC Syndrome  Post-operative Diagnosis: same  Indications: pain   Anesthesia: ethyl chloride   Procedure Details   Verbal consent was obtained for the procedure. The shoulder was prepped withalcohol and the skin was anesthetized. A 20 gauge needle was advanced into the subacromial space through posterior approach without difficulty  The space was then injected with 3 ml 1% lidocaine and 1 ml of depomedrol. The injection site was cleansed with isopropyl alcohol and a dressing was applied.  Complications:  None; patient tolerated the procedure well.   Home exercises are also prescribed follow up as

## 2012-12-05 NOTE — Patient Instructions (Addendum)
You have received a steroid shot. 15% of patients experience increased pain at the injection site with in the next 24 hours. This is best treated with ice and tylenol extra strength 2 tabs every 8 hours. If you are still having pain please call the office.   Do exercises at home

## 2013-01-17 ENCOUNTER — Other Ambulatory Visit: Payer: Self-pay

## 2013-02-21 ENCOUNTER — Ambulatory Visit (INDEPENDENT_AMBULATORY_CARE_PROVIDER_SITE_OTHER): Payer: Medicare Other | Admitting: Orthopedic Surgery

## 2013-02-21 VITALS — BP 188/96 | Ht 63.0 in | Wt 130.0 lb

## 2013-02-21 DIAGNOSIS — M67919 Unspecified disorder of synovium and tendon, unspecified shoulder: Secondary | ICD-10-CM

## 2013-02-21 DIAGNOSIS — M75101 Unspecified rotator cuff tear or rupture of right shoulder, not specified as traumatic: Secondary | ICD-10-CM

## 2013-02-21 NOTE — Progress Notes (Signed)
Patient ID: Erica [name], female   DOB: Jul 17, 1932, 79 y.o.   MRN: [id number] Chief Complaint  Patient presents with  . Follow-up    Recheck right shoulder request injection    BP 188/96  Ht 5\' 3"  (1.6 m)  Wt 130 lb (58.968 kg)  BMI 23.41 kg/m45  79 year old female given an injection back in September for persistent bursitis presents requesting second injection. She did not do the exercises as we suggested she did get relief. She still complains of painful for elevation denies neck pain or numbness or tingling in her hand or upper extremity  Repeat injection right shoulder for rotator cuff syndrome postop diagnosis same indications pain anesthesia for chloride details of procedure verbal consent obtained for the procedure the shoulder was prepped with alcohol and skin anesthetized with ethyl chloride a 20-gauge needle was used and advanced into the subacromial space via posterior approach no difficulty. Injection performed 3 mL's of 1% lidocaine and 140 mg dose of Depo-Medrol sterile dressing applied no complications  The patient advised to do exercises.

## 2013-06-05 ENCOUNTER — Encounter (INDEPENDENT_AMBULATORY_CARE_PROVIDER_SITE_OTHER): Payer: Self-pay | Admitting: *Deleted

## 2013-06-11 ENCOUNTER — Ambulatory Visit (INDEPENDENT_AMBULATORY_CARE_PROVIDER_SITE_OTHER): Payer: Medicare Other | Admitting: Orthopedic Surgery

## 2013-06-11 VITALS — BP 154/89 | Ht 63.0 in | Wt 130.0 lb

## 2013-06-11 DIAGNOSIS — S43429A Sprain of unspecified rotator cuff capsule, initial encounter: Secondary | ICD-10-CM

## 2013-06-11 DIAGNOSIS — M75101 Unspecified rotator cuff tear or rupture of right shoulder, not specified as traumatic: Secondary | ICD-10-CM

## 2013-06-11 NOTE — Progress Notes (Signed)
Patient ID: Erica [name], female   DOB: 1932-08-04, 80 y.o.   MRN: [id number]  Chief Complaint  Patient presents with  . Shoulder Pain    Right reoccuring shoulder pain    HISTORY: 80 year old female with ongoing right shoulder pain x-ray showed rotator cuff disease she's had home therapy, to injections, anti-inflammatories no improvement  No numbness or tingling  General appearance is normal, the patient is alert and oriented x3 with normal mood and affect. BP 154/89  Ht 5\' 3"  (1.6 m)  Wt 130 lb (58.968 kg)  BMI 23.03 kg/m2  Tenderness in the peri-acromial region decreased range of motion in forward elevation painful forward elevation positive impingement sign no instability mild rotator cuff weakness normal sensation  X-ray again showed rotator cuff disease  She's had pain now going on a 1-2 years  recommend MRI to rule out rotator cuff tear

## 2013-06-11 NOTE — Patient Instructions (Signed)
MRI ordered Call to arrange therapy at [facility]

## 2013-06-14 ENCOUNTER — Ambulatory Visit (HOSPITAL_COMMUNITY)
Admission: RE | Admit: 2013-06-14 | Discharge: 2013-06-14 | Disposition: A | Discharge status: Home / Self Care | Payer: Medicare Other | Source: Ambulatory Visit | Attending: Orthopedic Surgery | Admitting: Orthopedic Surgery

## 2013-06-14 DIAGNOSIS — M75101 Unspecified rotator cuff tear or rupture of right shoulder, not specified as traumatic: Secondary | ICD-10-CM

## 2013-06-14 DIAGNOSIS — M25511 Pain in right shoulder: Secondary | ICD-10-CM

## 2013-06-14 DIAGNOSIS — IMO0001 Reserved for inherently not codable concepts without codable children: Secondary | ICD-10-CM | POA: Insufficient documentation

## 2013-06-14 DIAGNOSIS — M25519 Pain in unspecified shoulder: Secondary | ICD-10-CM | POA: Insufficient documentation

## 2013-06-14 DIAGNOSIS — M6281 Muscle weakness (generalized): Secondary | ICD-10-CM | POA: Insufficient documentation

## 2013-06-14 NOTE — Evaluation (Signed)
Occupational Therapy Evaluation  Patient Details  Name: Erica [name] MRN: [id number] Date of Birth: May 21, 1932  Today's Date: 06/14/2013 Time: 6734-1937 OT Time Calculation (min): 45 min OT Evaluation 1400-1415 15' Manual therapy 1415-1435 20'  Visit#: 1 of 12  Re-eval: 07/12/13  Assessment Diagnosis: Right Rotator Cuff Syndrome Next MD Visit: 07/01/13 Prior Therapy: n/a  Authorization: BCBS Medicare  Authorization Time Period: before 10th visit  Authorization Visit#: 1 of 10   Past Medical History:  Past Medical History  Diagnosis Date  . Irritable bowel syndrome   . Thyroid condition   . Depression    Past Surgical History:  Past Surgical History  Procedure Laterality Date  . Colostomy    . Abdominal hysterectomy    . Colonoscopy    . Upper gastrointestinal endoscopy    . Eye surgery  cataract x 2  . Colonoscopy N/A 05/08/2012    Procedure: COLONOSCOPY;  Surgeon: Malissa Hippo, MD;  Location: [facility];  Service: Endoscopy;  Laterality: N/A;  730    Subjective  S:  I am not sure what has caused the pain in my right shoulder.  It is really bad.   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  Her right shoulder has been causing her increased pain and has limiited mobility for over a year.  She consulted with Dr. Romeo Apple and has recieved 2 cortisone injections, the first injection helping more than the second.  She has been referred to occupational therapy for evaluation and treatment.   Limitations: progress as tolerated Special Tests: FOTO 45/100 Patient Stated Goals: I want to be able to paint and use my arm without my shoulder hurting. Pain Assessment Currently in Pain?: Yes Pain Score: 7  Pain Location: Shoulder Pain Orientation: Right Pain Type: Acute pain  Precautions/Restrictions  Precautions Precautions: None Restrictions Weight Bearing Restrictions: No  Balance Screening Balance Screen Has the patient  fallen in the past 6 months: No Has the patient had a decrease in activity level because of a fear of falling? : No Is the patient reluctant to leave their home because of a fear of falling? : No  Prior Functioning  Home Living Additional Comments: Erica [name] lives with her husband Prior Function Level of Independence: Independent with basic ADLs Driving: Yes Vocation: Retired Comments: enjoys Clinical cytogeneticist, gardening, and Designer, fashion/clothing bow ties  Assessment ADL/Vision/Perception ADL ADL Comments: Erica [name] has increased pain and difficulty switching car gears, taking off a blouse, curling her hair, painting or other movements involving horizontal abduction Dominant Hand: Right Vision - History Baseline Vision: Wears glasses all the time  Cognition/Observation Cognition Orientation Level: Oriented X4  Sensation/Coordination/Edema Sensation Light Touch: Appears Intact Coordination Gross Motor Movements are Fluid and Coordinated: Yes Fine Motor Movements are Fluid and Coordinated: Yes  Additional Assessments RUE AROM (degrees) RUE Overall AROM Comments: assessed in standing, ER/IR with shoulder abducted to 90 Right Shoulder Flexion: 155 Degrees Right Shoulder ABduction: 150 Degrees Right Shoulder Internal Rotation: 35 Degrees Right Shoulder External Rotation: 62 Degrees RUE PROM (degrees) RUE Overall PROM Comments: assessed in supine and is [facility] RUE Strength RUE Overall Strength Comments: assessed in standing, ER/IR with shoulder adducted Right Shoulder Flexion: 4/5 Right Shoulder ABduction: 4/5 Right Shoulder Internal Rotation: 4/5 Right Shoulder External Rotation: 4/5 Palpation Palpation: moderate fascial restrictions noted in her right scapular region, upper arm, upper trapezius and shoulder region.     Exercise/Treatments    Manual Therapy Manual Therapy: Myofascial  release Myofascial Release: Myofascial release (MFR) and manual stretching to right upper arm,  scapular region, shoulder and upper trapezius region to decrease pain and fascial restrictions and improve pain free mobility.   Occupational Therapy Assessment and Plan OT Assessment and Plan Clinical Impression Statement: A:  Patient presents with decreased mobilty and strength and increased pain and restrictions in her right shoulder which is limiting her ability to complete B/IADLs and leisure activities.   Pt will benefit from skilled therapeutic intervention in order to improve on the following deficits: Decreased range of motion;Decreased strength;Increased muscle spasms;Increased fascial restricitons;Pain Rehab Potential: Good OT Frequency: Min 2X/week OT Duration: 6 weeks OT Treatment/Interventions: Self-care/ADL training;Therapeutic exercise;Manual therapy;Modalities;Patient/family education;Therapeutic activities OT Plan: P:  Skilled OT intervention to decrease pain and fascial restrictions and increase pain free mobility in right shoulder needed to return to use of right arm as dominant with daily activities.  Treatment Plan:  MFR and manual stretching in supine.  AAROM progressing to AROM as tolerated, isometric strengthening, scapular stability exercises.    Goals Short Term Goals Time to Complete Short Term Goals: 3 weeks Short Term Goal 1: Patient will be educated on a HEP. Short Term Goal 2: Patient will improve AROM to [facility] in her right shoulder for increased ability to curl her hair and remove her blouse without difficulty. Short Term Goal 3: Patient will improve right shoulder strength to 4+/5 for increased ability to lift bags of grocery out of her car. Short Term Goal 4: Patient will decrease pain in her right shoulder to 4/10 when completing daily activities.  Short Term Goal 5: Patient will decrease fascial restrictions to min-mod in her right shoulder region.  Long Term Goals Time to Complete Long Term Goals: 6 weeks Long Term Goal 1: Patient will return to prior level  of independence with all B/IADLs and leisure activities. Long Term Goal 2: Patient will improve AROM to WNL in her right shoulder for increased ability to curl her hair and remove her blouse without difficulty. Long Term Goal 3: Patient will improve right shoulder strength to 5/5 for increased ability to shift gears in her husbands car. Long Term Goal 4: Patient will decrease pain in her right shoulder to 2/10 when completing daily activities.  Long Term Goal 5: Patient will decrease fascial restrictions to min in her right shoulder region.   Problem List Patient Active Problem List   Diagnosis Date Noted  . Muscle weakness (generalized) 06/14/2013  . Rotator cuff syndrome of right shoulder 12/05/2012  . Right shoulder pain 12/05/2012  . Rectal bleeding 06/06/2011  . Epigastric pain 04/19/2011  . GERD (gastroesophageal reflux disease) 04/19/2011  . IBS (irritable bowel syndrome) 04/19/2011  . METATARSALGIA 05/12/2009  . BUNION 05/12/2009    End of Session Activity Tolerance: Patient tolerated treatment well General Behavior During Therapy: [facility] for tasks assessed/performed OT Plan of Care OT Home Exercise Plan: towel slides, scanned.  Consulted and Agree with Plan of Care: Patient  GO Functional Assessment Tool Used: FOTO 53/100 Functional Limitation: Carrying, moving and handling objects Carrying, Moving and Handling Objects Current Status (O1607): At least 40 percent but less than 60 percent impaired, limited or restricted Carrying, Moving and Handling Objects Goal Status [phone]): At least 1 percent but less than 20 percent impaired, limited or restricted  Shirlean Mylar, OTR/L  06/14/2013, 5:43 PM  Physician Documentation Your signature is required to indicate approval of the treatment plan as stated above.  Please sign and either send electronically or  make a copy of this report for your files and return this physician signed original.  Please mark one 1.__approve of plan   2. ___approve of plan with the following conditions.   ______________________________                                                          _____________________ Physician Signature                                                                                                             Date

## 2013-06-18 ENCOUNTER — Ambulatory Visit (HOSPITAL_COMMUNITY)
Admission: RE | Admit: 2013-06-18 | Discharge: 2013-06-18 | Disposition: A | Discharge status: Home / Self Care | Payer: Medicare Other | Source: Ambulatory Visit | Attending: Orthopedic Surgery | Admitting: Orthopedic Surgery

## 2013-06-18 DIAGNOSIS — M6281 Muscle weakness (generalized): Secondary | ICD-10-CM

## 2013-06-18 NOTE — Progress Notes (Signed)
Occupational Therapy Treatment Patient Details  Name: Erica [name] MRN: [id number] Date of Birth: 1933/03/13  Today's Date: 06/18/2013 Time: 1151-1228 OT Time Calculation (min): 37 min Manual therapy 6720-9470 19' Therapeutic exercises 1210-1228 18'  Visit#: 2 of 12  Re-eval: 07/12/13    Authorization: BCBS Medicare  Authorization Time Period: before 10th visit  Authorization Visit#: 2 of 10  Subjective  S:  I want you to read this" (article about flu shot and shoulder pain that lingers for month.) Pain Assessment Currently in Pain?: Yes Pain Score: 5  Pain Location: Shoulder Pain Orientation: Right Pain Type: Acute pain  Precautions/Restrictions   progress as tolerated  Exercise/Treatments Supine Protraction: PROM;AAROM;10 reps Horizontal ABduction: PROM;AAROM;10 reps External Rotation: PROM;AAROM;10 reps Internal Rotation: PROM;AAROM;10 reps Flexion: PROM;AAROM;10 reps ABduction: PROM;AAROM;10 reps Seated Elevation: AROM;10 reps Extension: AROM;10 reps Row: AROM;10 reps Therapy Ball Flexion: 20 reps ABduction: 20 reps ROM / Strengthening / Isometric Strengthening Thumb Tacks: 1' Prot/Ret//Elev/Dep: 1'      Manual Therapy Manual Therapy: Myofascial release Myofascial Release: Myofascial release (MFR) and manual stretching to right upper arm, scapular region, shoulder and upper trapezius region to decrease pain and fascial restrictions and improve pain free mobility.   Occupational Therapy Assessment and Plan OT Assessment and Plan Clinical Impression Statement: A:Added dowel rod exercises and ball stretches with good results.  OT Plan: P:  Attempt AAROM in seated.     Goals Short Term Goals Time to Complete Short Term Goals: 3 weeks Short Term Goal 1: Patient will be educated on a HEP. Short Term Goal 1 Progress: Progressing toward goal Short Term Goal 2: Patient will improve AROM to [facility] in her right shoulder for increased ability to curl her hair and  remove her blouse without difficulty. Short Term Goal 2 Progress: Progressing toward goal Short Term Goal 3: Patient will improve right shoulder strength to 4+/5 for increased ability to lift bags of grocery out of her car. Short Term Goal 3 Progress: Progressing toward goal Short Term Goal 4: Patient will decrease pain in her right shoulder to 4/10 when completing daily activities.  Short Term Goal 4 Progress: Progressing toward goal Short Term Goal 5: Patient will decrease fascial restrictions to min-mod in her right shoulder region.  Short Term Goal 5 Progress: Progressing toward goal Long Term Goals Time to Complete Long Term Goals: 6 weeks Long Term Goal 1: Patient will return to prior level of independence with all B/IADLs and leisure activities. Long Term Goal 1 Progress: Progressing toward goal Long Term Goal 2: Patient will improve AROM to WNL in her right shoulder for increased ability to curl her hair and remove her blouse without difficulty. Long Term Goal 2 Progress: Progressing toward goal Long Term Goal 3: Patient will improve right shoulder strength to 5/5 for increased ability to shift gears in her husbands car. Long Term Goal 3 Progress: Progressing toward goal Long Term Goal 4: Patient will decrease pain in her right shoulder to 2/10 when completing daily activities.  Long Term Goal 4 Progress: Progressing toward goal Long Term Goal 5: Patient will decrease fascial restrictions to min in her right shoulder region.  Long Term Goal 5 Progress: Progressing toward goal  Problem List Patient Active Problem List   Diagnosis Date Noted  . Muscle weakness (generalized) 06/14/2013  . Rotator cuff syndrome of right shoulder 12/05/2012  . Right shoulder pain 12/05/2012  . Rectal bleeding 06/06/2011  . Epigastric pain 04/19/2011  . GERD (gastroesophageal reflux disease) 04/19/2011  .  IBS (irritable bowel syndrome) 04/19/2011  . METATARSALGIA 05/12/2009  . BUNION 05/12/2009     End of Session Activity Tolerance: Patient tolerated treatment well General Behavior During Therapy: [facility] for tasks assessed/performed OT Plan of Care OT Home Exercise Plan: dowel rod exercises.  [place], OTR/L  06/18/2013, 12:28 PM

## 2013-06-20 ENCOUNTER — Ambulatory Visit (HOSPITAL_COMMUNITY): Payer: Medicare Other

## 2013-06-21 ENCOUNTER — Ambulatory Visit (HOSPITAL_COMMUNITY)
Admission: RE | Admit: 2013-06-21 | Discharge: 2013-06-21 | Disposition: A | Discharge status: Home / Self Care | Payer: Medicare Other | Source: Ambulatory Visit | Attending: Internal Medicine | Admitting: Internal Medicine

## 2013-06-21 DIAGNOSIS — M6281 Muscle weakness (generalized): Secondary | ICD-10-CM

## 2013-06-21 NOTE — Progress Notes (Signed)
Occupational Therapy Treatment Patient Details  Name: MADDUX FIRST MRN: [id number] Date of Birth: 04-25-1932  Today's Date: 06/21/2013 Time: 5701-7793 OT Time Calculation (min): 37 min Manual therapy 9030-0923 15' Therapeutic exercises 1208-1230 22'  Visit#: 3 of 12  Re-eval: 07/12/13    Authorization: BCBS Medicare  Authorization Time Period: before 10th visit  Authorization Visit#: 3 of 10  Subjective S:  When I rest my shoulder it doesnt hurt. Pain Assessment Currently in Pain?: Yes Pain Score: 5  Pain Location: Shoulder Pain Orientation: Right Pain Type: Acute pain  Precautions/Restrictions   progress as tolerated  Exercise/Treatments Supine Protraction: PROM;10 reps;AAROM;15 reps Horizontal ABduction: PROM;10 reps;AAROM;15 reps External Rotation: PROM;10 reps;AAROM;15 reps Internal Rotation: PROM;10 reps;AAROM;15 reps Flexion: PROM;10 reps;AAROM;15 reps ABduction: PROM;10 reps;AAROM;15 reps Seated Elevation: AROM Extension: AROM;10 reps Row: AROM;10 reps Standing Protraction: 10 reps;AAROM Horizontal ABduction: AAROM;10 reps External Rotation: AAROM;10 reps Internal Rotation: AAROM;10 reps Flexion: AAROM;10 reps ABduction: AAROM;10 reps Therapy Ball Flexion: 20 reps ABduction: 20 reps ROM / Strengthening / Isometric Strengthening Thumb Tacks: 1' Prot/Ret//Elev/Dep: 1'       Manual Therapy Manual Therapy: Myofascial release Myofascial Release: Myofascial release (MFR) and manual stretching to right upper arm, scapular region, shoulder and upper trapezius region to decrease pain and fascial restrictions and improve pain free mobility.  Occupational Therapy Assessment and Plan OT Assessment and Plan Clinical Impression Statement: A:  Added dowel rod exercises in standing with full range. 0 pain except for external rotation.  OT Plan: P:  attempt AROM in supine, add wall wash.    Goals Short Term Goals Time to Complete Short Term Goals: 3  weeks Short Term Goal 1: Patient will be educated on a HEP. Short Term Goal 2: Patient will improve AROM to [facility] in her right shoulder for increased ability to curl her hair and remove her blouse without difficulty. Short Term Goal 3: Patient will improve right shoulder strength to 4+/5 for increased ability to lift bags of grocery out of her car. Short Term Goal 4: Patient will decrease pain in her right shoulder to 4/10 when completing daily activities.  Short Term Goal 5: Patient will decrease fascial restrictions to min-mod in her right shoulder region.  Long Term Goals Time to Complete Long Term Goals: 6 weeks Long Term Goal 1: Patient will return to prior level of independence with all B/IADLs and leisure activities. Long Term Goal 2: Patient will improve AROM to WNL in her right shoulder for increased ability to curl her hair and remove her blouse without difficulty. Long Term Goal 3: Patient will improve right shoulder strength to 5/5 for increased ability to shift gears in her husbands car. Long Term Goal 4: Patient will decrease pain in her right shoulder to 2/10 when completing daily activities.  Long Term Goal 5: Patient will decrease fascial restrictions to min in her right shoulder region.   Problem List Patient Active Problem List   Diagnosis Date Noted  . Muscle weakness (generalized) 06/14/2013  . Rotator cuff syndrome of right shoulder 12/05/2012  . Right shoulder pain 12/05/2012  . Rectal bleeding 06/06/2011  . Epigastric pain 04/19/2011  . GERD (gastroesophageal reflux disease) 04/19/2011  . IBS (irritable bowel syndrome) 04/19/2011  . METATARSALGIA 05/12/2009  . BUNION 05/12/2009    End of Session Activity Tolerance: Patient tolerated treatment well General Behavior During Therapy: [facility] for tasks assessed/performed  [place], OTR/L 06/21/2013, 12:34 PM

## 2013-06-25 ENCOUNTER — Ambulatory Visit (HOSPITAL_COMMUNITY)
Admission: RE | Admit: 2013-06-25 | Discharge: 2013-06-25 | Disposition: A | Discharge status: Home / Self Care | Payer: Medicare Other | Source: Ambulatory Visit | Attending: Orthopedic Surgery | Admitting: Orthopedic Surgery

## 2013-06-25 DIAGNOSIS — M6281 Muscle weakness (generalized): Secondary | ICD-10-CM

## 2013-06-25 NOTE — Progress Notes (Signed)
Occupational Therapy Treatment Patient Details  Name: Erica [name] MRN: [id number] Date of Birth: 07-06-1932  Today's Date: 06/25/2013 Time: 2947-6546 OT Time Calculation (min): 55 min Manual therapy 5035-4656 20' Therapeutic exercises 8127-5170 35'  Visit#: 4 of 12  Re-eval: 07/12/13    Authorization: BCBS Medicare  Authorization Time Period: before 10th visit  Authorization Visit#: 4 of 10  Subjective S:  It starts huritng in my neck now. Pain Assessment Currently in Pain?: Yes Pain Score: 5  Pain Location: Shoulder Pain Orientation: Right Pain Type: Acute pain  Precautions/Restrictions   progress as tolerated  Exercise/Treatments Supine Protraction: PROM;10 reps;AAROM;15 reps Horizontal ABduction: PROM;10 reps;AAROM;15 reps External Rotation: PROM;10 reps;AAROM;15 reps Internal Rotation: PROM;10 reps;AAROM;15 reps Flexion: PROM;10 reps;AAROM;15 reps ABduction: PROM;10 reps;AAROM;15 reps Seated Elevation: AROM;15 reps Extension: AROM;15 reps Row: AROM;15 reps Standing Protraction: AAROM Horizontal ABduction: AAROM;10 reps External Rotation: AAROM;10 reps Internal Rotation: AAROM;10 reps Flexion: AAROM;10 reps ABduction: AAROM;10 reps ROM / Strengthening / Isometric Strengthening Wall Wash: 2' Thumb Tacks: 1' Prot/Ret//Elev/Dep: 1'      Manual Therapy Manual Therapy: Myofascial release Myofascial Release: Myofascial release (MFR) and manual stretching to right upper arm, scapular region, shoulder and upper trapezius region to decrease pain and fascial restrictions and improve pain free mobility.  MFR to sternocleidomastoid this date to decrease pain in cervical anterior region.  Patient had good resutls with pain.  Occupational Therapy Assessment and Plan OT Assessment and Plan Clinical Impression Statement: A:  added MFR to Sternocleidomastoid this date. OT Plan: P:  Attempt AROM in supine, increase independence with prot/ret//elev/dep.    Goals Short Term Goals Time to Complete Short Term Goals: 3 weeks Short Term Goal 1: Patient will be educated on a HEP. Short Term Goal 1 Progress: Progressing toward goal Short Term Goal 2: Patient will improve AROM to [facility] in her right shoulder for increased ability to curl her hair and remove her blouse without difficulty. Short Term Goal 2 Progress: Progressing toward goal Short Term Goal 3: Patient will improve right shoulder strength to 4+/5 for increased ability to lift bags of grocery out of her car. Short Term Goal 3 Progress: Progressing toward goal Short Term Goal 4: Patient will decrease pain in her right shoulder to 4/10 when completing daily activities.  Short Term Goal 4 Progress: Progressing toward goal Short Term Goal 5: Patient will decrease fascial restrictions to min-mod in her right shoulder region.  Short Term Goal 5 Progress: Progressing toward goal Long Term Goals Time to Complete Long Term Goals: 6 weeks Long Term Goal 1: Patient will return to prior level of independence with all B/IADLs and leisure activities. Long Term Goal 1 Progress: Progressing toward goal Long Term Goal 2: Patient will improve AROM to WNL in her right shoulder for increased ability to curl her hair and remove her blouse without difficulty. Long Term Goal 2 Progress: Progressing toward goal Long Term Goal 3: Patient will improve right shoulder strength to 5/5 for increased ability to shift gears in her husbands car. Long Term Goal 3 Progress: Progressing toward goal Long Term Goal 4: Patient will decrease pain in her right shoulder to 2/10 when completing daily activities.  Long Term Goal 4 Progress: Progressing toward goal Long Term Goal 5: Patient will decrease fascial restrictions to min in her right shoulder region.  Long Term Goal 5 Progress: Progressing toward goal  Problem List Patient Active Problem List   Diagnosis Date Noted  . Muscle weakness (generalized) 06/14/2013  .  Rotator cuff  syndrome of right shoulder 12/05/2012  . Right shoulder pain 12/05/2012  . Rectal bleeding 06/06/2011  . Epigastric pain 04/19/2011  . GERD (gastroesophageal reflux disease) 04/19/2011  . IBS (irritable bowel syndrome) 04/19/2011  . METATARSALGIA 05/12/2009  . BUNION 05/12/2009    End of Session Activity Tolerance: Patient tolerated treatment well General Behavior During Therapy: [facility] for tasks assessed/performed  [place], OTR/L 06/25/2013, 4:50 PM

## 2013-06-28 ENCOUNTER — Ambulatory Visit (HOSPITAL_COMMUNITY)
Admission: RE | Admit: 2013-06-28 | Discharge: 2013-06-28 | Disposition: A | Discharge status: Home / Self Care | Payer: Medicare Other | Source: Ambulatory Visit | Attending: Orthopedic Surgery | Admitting: Orthopedic Surgery

## 2013-06-28 DIAGNOSIS — M6281 Muscle weakness (generalized): Secondary | ICD-10-CM

## 2013-06-28 NOTE — Progress Notes (Signed)
Occupational Therapy Treatment Patient Details  Name: ROYANN WILDASIN MRN: [id number] Date of Birth: May 23, 1932  Today's Date: 06/28/2013 Time: 5701-7793 OT Time Calculation (min): 40 min Manual therapy 1305-1330 25'  therapeutic exercises 1330-1345 15'  Visit#: 6 of 12  Re-eval: 07/12/13    Authorization: BCBS Medicare  Authorization Time Period: before 10th visit  Authorization Visit#: 6 of 10  Subjective  S;  I have an MRI Thursday morning. Pain Assessment Currently in Pain?: Yes Pain Score: 4  Pain Location: Shoulder Pain Orientation: Right Pain Type: Acute pain  Precautions/Restrictions     Exercise/Treatments Supine Protraction: PROM;10 reps;AAROM;15 reps Horizontal ABduction: PROM;10 reps;AAROM;15 reps External Rotation: PROM;10 reps;AAROM;15 reps Internal Rotation: PROM;10 reps;AAROM;15 reps Flexion: PROM;10 reps;AAROM;15 reps ABduction: PROM;10 reps;AAROM;15 reps Standing Protraction: AAROM;15 reps Horizontal ABduction: AAROM;15 reps External Rotation: AAROM;Theraband;15 reps Theraband Level (Shoulder External Rotation): Level 2 (Red) Internal Rotation: AAROM;Theraband;15 reps Theraband Level (Shoulder Internal Rotation): Level 2 (Red) Flexion: AAROM;15 reps ABduction: AAROM;15 reps Extension: Theraband;10 reps Theraband Level (Shoulder Extension): Level 2 (Red) Row: Theraband;10 reps Theraband Level (Shoulder Row): Level 2 (Red) Retraction: Theraband;10 reps Theraband Level (Shoulder Retraction): Level 2 (Red)     Manual Therapy Manual Therapy: Myofascial release Myofascial Release: Myofascial release (MFR) and manual stretching to right upper arm, scapular region, shoulder and upper trapezius region to decrease pain and fascial restrictions and improve pain free mobility. MFR to sternocleidomastoid this date to decrease pain in cervical anterior region. Patient had good resutls with pain.  Occupational Therapy Assessment and Plan OT Assessment and  Plan Clinical Impression Statement: A: AAROM in supine and standing is full range. added theraband in standing. OT Plan: P:  Attempt AROM in supine and standing.   Goals Short Term Goals Time to Complete Short Term Goals: 3 weeks Short Term Goal 1: Patient will be educated on a HEP. Short Term Goal 2: Patient will improve AROM to [facility] in her right shoulder for increased ability to curl her hair and remove her blouse without difficulty. Short Term Goal 3: Patient will improve right shoulder strength to 4+/5 for increased ability to lift bags of grocery out of her car. Short Term Goal 4: Patient will decrease pain in her right shoulder to 4/10 when completing daily activities.  Short Term Goal 5: Patient will decrease fascial restrictions to min-mod in her right shoulder region.  Long Term Goals Time to Complete Long Term Goals: 6 weeks Long Term Goal 1: Patient will return to prior level of independence with all B/IADLs and leisure activities. Long Term Goal 2: Patient will improve AROM to WNL in her right shoulder for increased ability to curl her hair and remove her blouse without difficulty. Long Term Goal 3: Patient will improve right shoulder strength to 5/5 for increased ability to shift gears in her husbands car. Long Term Goal 4: Patient will decrease pain in her right shoulder to 2/10 when completing daily activities.  Long Term Goal 5: Patient will decrease fascial restrictions to min in her right shoulder region.   Problem List Patient Active Problem List   Diagnosis Date Noted  . Muscle weakness (generalized) 06/14/2013  . Rotator cuff syndrome of right shoulder 12/05/2012  . Right shoulder pain 12/05/2012  . Rectal bleeding 06/06/2011  . Epigastric pain 04/19/2011  . GERD (gastroesophageal reflux disease) 04/19/2011  . IBS (irritable bowel syndrome) 04/19/2011  . METATARSALGIA 05/12/2009  . BUNION 05/12/2009    End of Session Activity Tolerance: Patient tolerated  treatment well General Behavior During  Therapy: [facility] for tasks assessed/performed  GO    Shirlean Mylar, OTR/L [phone]  06/28/2013, 2:52 PM

## 2013-07-01 ENCOUNTER — Ambulatory Visit: Payer: Medicare Other | Admitting: Orthopedic Surgery

## 2013-07-02 ENCOUNTER — Ambulatory Visit (HOSPITAL_COMMUNITY)
Admission: RE | Admit: 2013-07-02 | Discharge: 2013-07-02 | Disposition: A | Discharge status: Home / Self Care | Payer: Medicare Other | Source: Ambulatory Visit | Attending: Orthopedic Surgery | Admitting: Orthopedic Surgery

## 2013-07-02 DIAGNOSIS — M6281 Muscle weakness (generalized): Secondary | ICD-10-CM

## 2013-07-02 NOTE — Progress Notes (Signed)
Occupational Therapy Treatment Patient Details  Name: Erica [name] MRN: [id number] Date of Birth: [birth year]  Today's Date: 07/02/2013 Time: 5038-8828 OT Time Calculation (min): 38 min MFR 0034-9179 13' Therex 1505-6979 25'  Visit#: 7 of 12  Re-eval: 07/12/13    Authorization: BCBS Medicare  Authorization Time Period: before 10th visit  Authorization Visit#: 7 of 10  Subjective Symptoms/Limitations Symptoms: S: I missed my MRI appt so now it's scheduled for Thursday. Pain Assessment Currently in Pain?: Yes Pain Score: 6  Pain Location: Shoulder Pain Orientation: Right Pain Type: Acute pain  Precautions/Restrictions  Precautions Precautions: None  Exercise/Treatments Supine Protraction: PROM;10 reps;AROM;12 reps Horizontal ABduction: PROM;10 reps;AROM;12 reps External Rotation: PROM;10 reps;AROM;12 reps Internal Rotation: PROM;10 reps;AROM;12 reps Flexion: PROM;10 reps;AROM;12 reps ABduction: PROM;10 reps;AROM;12 reps Seated Elevation: AROM;15 reps Extension: AROM;15 reps Row: AROM;15 reps Standing Protraction: AROM;12 reps Horizontal ABduction: AROM;12 reps External Rotation: AROM;12 reps Internal Rotation: AROM;12 reps Flexion: AROM;12 reps ABduction: AROM;12 reps ROM / Strengthening / Isometric Strengthening Wall Wash: 2'       Manual Therapy Manual Therapy: Myofascial release Myofascial Release: Myofascial release (MFR) and manual stretching to right upper arm, scapular region, shoulder and upper trapezius region to decrease pain and fascial restrictions and improve pain free mobility. MFR to sternocleidomastoid this date to decrease pain in cervical anterior region  Occupational Therapy Assessment and Plan OT Assessment and Plan Clinical Impression Statement: A: Completed all AROM supine and standing. patient tolerated well. Slight pain during manual stretching of shoulder flexion and AROM abduction. OT Plan: P: UBE bike.   Goals Short Term  Goals Time to Complete Short Term Goals: 3 weeks Short Term Goal 1: Patient will be educated on a HEP. Short Term Goal 1 Progress: Progressing toward goal Short Term Goal 2: Patient will improve AROM to [facility] in her right shoulder for increased ability to curl her hair and remove her blouse without difficulty. Short Term Goal 2 Progress: Progressing toward goal Short Term Goal 3: Patient will improve right shoulder strength to 4+/5 for increased ability to lift bags of grocery out of her car. Short Term Goal 3 Progress: Progressing toward goal Short Term Goal 4: Patient will decrease pain in her right shoulder to 4/10 when completing daily activities.  Short Term Goal 4 Progress: Progressing toward goal Short Term Goal 5: Patient will decrease fascial restrictions to min-mod in her right shoulder region.  Short Term Goal 5 Progress: Progressing toward goal Long Term Goals Time to Complete Long Term Goals: 6 weeks Long Term Goal 1: Patient will return to prior level of independence with all B/IADLs and leisure activities. Long Term Goal 1 Progress: Progressing toward goal Long Term Goal 2: Patient will improve AROM to WNL in her right shoulder for increased ability to curl her hair and remove her blouse without difficulty. Long Term Goal 2 Progress: Progressing toward goal Long Term Goal 3: Patient will improve right shoulder strength to 5/5 for increased ability to shift gears in her husbands car. Long Term Goal 3 Progress: Progressing toward goal Long Term Goal 4: Patient will decrease pain in her right shoulder to 2/10 when completing daily activities.  Long Term Goal 4 Progress: Progressing toward goal Long Term Goal 5: Patient will decrease fascial restrictions to min in her right shoulder region.  Long Term Goal 5 Progress: Progressing toward goal  Problem List Patient Active Problem List   Diagnosis Date Noted  . Muscle weakness (generalized) 06/14/2013  . Rotator cuff syndrome of  right  shoulder 12/05/2012  . Right shoulder pain 12/05/2012  . Rectal bleeding 06/06/2011  . Epigastric pain 04/19/2011  . GERD (gastroesophageal reflux disease) 04/19/2011  . IBS (irritable bowel syndrome) 04/19/2011  . METATARSALGIA 05/12/2009  . BUNION 05/12/2009    End of Session Activity Tolerance: Patient tolerated treatment well General Behavior During Therapy: [facility] for tasks assessed/performed   Limmie Patricia, OTR/L,CBIS   07/02/2013, 1:46 PM

## 2013-07-04 ENCOUNTER — Ambulatory Visit (HOSPITAL_COMMUNITY)
Admission: RE | Admit: 2013-07-04 | Discharge: 2013-07-04 | Disposition: A | Discharge status: Home / Self Care | Payer: Medicare Other | Source: Ambulatory Visit | Attending: Orthopedic Surgery | Admitting: Orthopedic Surgery

## 2013-07-04 ENCOUNTER — Encounter (HOSPITAL_COMMUNITY): Payer: Self-pay

## 2013-07-04 DIAGNOSIS — M6740 Ganglion, unspecified site: Secondary | ICD-10-CM | POA: Insufficient documentation

## 2013-07-04 DIAGNOSIS — S46819A Strain of other muscles, fascia and tendons at shoulder and upper arm level, unspecified arm, initial encounter: Secondary | ICD-10-CM | POA: Insufficient documentation

## 2013-07-04 DIAGNOSIS — M719 Bursopathy, unspecified: Secondary | ICD-10-CM | POA: Insufficient documentation

## 2013-07-04 DIAGNOSIS — M75101 Unspecified rotator cuff tear or rupture of right shoulder, not specified as traumatic: Secondary | ICD-10-CM

## 2013-07-04 DIAGNOSIS — M25619 Stiffness of unspecified shoulder, not elsewhere classified: Secondary | ICD-10-CM | POA: Insufficient documentation

## 2013-07-04 DIAGNOSIS — X58XXXA Exposure to other specified factors, initial encounter: Secondary | ICD-10-CM | POA: Insufficient documentation

## 2013-07-04 DIAGNOSIS — M19019 Primary osteoarthritis, unspecified shoulder: Secondary | ICD-10-CM | POA: Insufficient documentation

## 2013-07-04 DIAGNOSIS — M67919 Unspecified disorder of synovium and tendon, unspecified shoulder: Secondary | ICD-10-CM | POA: Insufficient documentation

## 2013-07-04 DIAGNOSIS — M25519 Pain in unspecified shoulder: Secondary | ICD-10-CM | POA: Insufficient documentation

## 2013-07-05 ENCOUNTER — Ambulatory Visit (HOSPITAL_COMMUNITY): Payer: Medicare Other | Admitting: Specialist

## 2013-07-09 ENCOUNTER — Other Ambulatory Visit (HOSPITAL_COMMUNITY): Payer: Self-pay | Admitting: Internal Medicine

## 2013-07-09 ENCOUNTER — Ambulatory Visit (INDEPENDENT_AMBULATORY_CARE_PROVIDER_SITE_OTHER): Payer: Medicare Other | Admitting: Orthopedic Surgery

## 2013-07-09 ENCOUNTER — Ambulatory Visit (HOSPITAL_COMMUNITY): Payer: Medicare Other | Admitting: Specialist

## 2013-07-09 ENCOUNTER — Telehealth (HOSPITAL_COMMUNITY): Payer: Self-pay | Admitting: Specialist

## 2013-07-09 VITALS — BP 128/63 | Ht 63.0 in | Wt 130.0 lb

## 2013-07-09 DIAGNOSIS — S43429A Sprain of unspecified rotator cuff capsule, initial encounter: Secondary | ICD-10-CM

## 2013-07-09 DIAGNOSIS — M75101 Unspecified rotator cuff tear or rupture of right shoulder, not specified as traumatic: Secondary | ICD-10-CM

## 2013-07-09 DIAGNOSIS — Z1231 Encounter for screening mammogram for malignant neoplasm of breast: Secondary | ICD-10-CM

## 2013-07-09 NOTE — Patient Instructions (Addendum)
Will talk with psychiatrist before doing surgey Stop therapy

## 2013-07-09 NOTE — Telephone Encounter (Signed)
Patient dropped by to state that MD has dc all further therapy visits

## 2013-07-09 NOTE — Progress Notes (Signed)
Patient ID: Erica [name], female   DOB: 1933/01/04, 80 y.o.   MRN: [id number]  Chief Complaint  Patient presents with  . Results    MRI Results Right Shoulder    The MRI shows a very interesting cuff tear. Basically we've got a subscapularis tear with medial subluxation and degeneration biceps tendon and tearing of the biceps tendon glenohumeral arthritis labral tear intrasubstance tear supraspinatus and a moderate amount of a.c. Joint arthrosis  The patient is on a medication called NARDLE which is in MAOI inhibitor and we will need to discuss with her psychiatrist how to manage this if she eventually agrees to have surgery which is required to repair this problem

## 2013-07-12 ENCOUNTER — Ambulatory Visit (HOSPITAL_COMMUNITY): Payer: Medicare Other | Admitting: Specialist

## 2013-07-15 ENCOUNTER — Ambulatory Visit (HOSPITAL_COMMUNITY): Payer: Medicare Other

## 2013-07-22 ENCOUNTER — Ambulatory Visit (HOSPITAL_COMMUNITY): Payer: Medicare Other

## 2013-07-29 ENCOUNTER — Ambulatory Visit (INDEPENDENT_AMBULATORY_CARE_PROVIDER_SITE_OTHER): Payer: Medicare Other | Admitting: Internal Medicine

## 2013-07-29 ENCOUNTER — Encounter (INDEPENDENT_AMBULATORY_CARE_PROVIDER_SITE_OTHER): Payer: Self-pay | Admitting: Internal Medicine

## 2013-07-29 VITALS — BP 126/80 | HR 112 | Ht 63.0 in | Wt 123.4 lb

## 2013-07-29 DIAGNOSIS — R110 Nausea: Secondary | ICD-10-CM

## 2013-07-29 MED ORDER — ONDANSETRON HCL 4 MG PO TABS: 4.0000 mg | ORAL_TABLET | Freq: Three times a day (TID) | ORAL | Status: DC | PRN

## 2013-07-29 MED ORDER — HYOSCYAMINE SULFATE 0.125 MG SL SUBL: 0.1250 mg | SUBLINGUAL_TABLET | Freq: Three times a day (TID) | SUBLINGUAL | Status: DC

## 2013-07-29 NOTE — Patient Instructions (Signed)
Levsin tid as needed, Zofran q 6 hrs as needed. Bland diet.

## 2013-07-29 NOTE — Progress Notes (Signed)
Subjective:     Patient ID: Erica [name], female   DOB: 27-Jan-1933, 80 y.o.   MRN: [id number]  HPIPresents today with c/o nausea but no vomiting. She says if feels more like acid reflux. Gas X has helped.  She has had symptoms x 5-6 days.  She has been on Methotrexate sq for 3-4 months. She is eating. Has been off Nardil x 7 days. She says she will eat and then she will go to the [facility] and have a BM. Stools are formed to liquid. She states her normal weight is 129. Today his weight is 123.4. She is having one stool after each meal. She saw [facility] about  3 weeks ago and she describes as a small amount.   She has bloating and a gassy pain. Hx of ischemic colitis and IBS.  She states she is going to have shoulder surgery next week. (Bone spurs).  05/08/2012 Colonoscopy: Indications: Patient is 80 year old Caucasian female with acute onset or rectal bleeding left-sided abdominal pain.  Impression:  Examination performed to cecum.  Left-sided colitis most likely ischemic in origin.  Stool samples obtained for culture and O&P.  Wide-open colonic anastomosis with few diverticula in the vicinity.  Small external hemorrhoids.    Notes Recorded by Malissa Hippo, MD on 05/10/2012 at 11:26 AM Stool studies negative. Biopsy consistent with ischemic colitis. Results reviewed with patient and she is feeling better every day. No further workup unless she has another episode of ischemic colitis. Reports to Dr. Ouida Sills       Review of Systems see hpi  Past Medical History  Diagnosis Date  . Irritable bowel syndrome   . Thyroid condition   . Depression     Past Surgical History  Procedure Laterality Date  . Colostomy    . Abdominal hysterectomy    . Colonoscopy    . Upper gastrointestinal endoscopy    . Eye surgery  cataract x 2  . Colonoscopy N/A 05/08/2012    Procedure: COLONOSCOPY;  Surgeon: Malissa Hippo, MD;  Location: [facility];  Service: Endoscopy;  Laterality: N/A;  730     Allergies  Allergen Reactions  . Penicillins     Current Outpatient Prescriptions on File Prior to Visit  Medication Sig Dispense Refill  . bifidobacterium infantis (ALIGN) capsule Take 1 capsule by mouth daily.      . Cyanocobalamin (VITAMIN B-12) 1000 MCG SUBL Place under the tongue daily.      Marland Kitchen levothyroxine (SYNTHROID) 75 MCG tablet Take 75 mcg by mouth daily.      . Methotrexate Sodium (METHOTREXATE PO) Take by mouth.      . phenelzine (NARDIL) 15 MG tablet Take 15 mg by mouth 2 (two) times daily.       No current facility-administered medications on file prior to visit.        Objective:   Physical Exam  Filed Vitals:   07/29/13 1133  BP: 126/80  Pulse: 112  Height: 5\' 3"  (1.6 m)  Weight: 123 lb 6.4 oz (55.974 kg)  Alert and oriented. Skin warm and dry. Oral mucosa is moist.   . Sclera anicteric, conjunctivae is pink. Thyroid not enlarged. No cervical lymphadenopathy. Lungs clear. Heart regular rate and rhythm.  Abdomen is soft. Bowel sounds are positive. No hepatomegaly. No abdominal masses felt. No tenderness.  No edema to lower extremities.        Assessment:     Nausea with out vomiting. No fever. Having 3 stools  a day after each meal. Hx of IBS, There really is no abdominal pain. She has been off the Nardil x 7 day. ?if this is related to her coming off the medications. I discussed this case with Dr. Karilyn Cota,.    Plan:     Rx for Zofran 4mg  every 6 hrs as needed. Levsin three times a day as needed. PR in 2 days. Bland diet to see if symptoms resolve.

## 2013-08-07 HISTORY — PX: OTHER SURGICAL HISTORY: SHX169

## 2013-08-13 ENCOUNTER — Ambulatory Visit (INDEPENDENT_AMBULATORY_CARE_PROVIDER_SITE_OTHER): Payer: Medicare Other | Admitting: Internal Medicine

## 2013-08-13 ENCOUNTER — Encounter (INDEPENDENT_AMBULATORY_CARE_PROVIDER_SITE_OTHER): Payer: Self-pay | Admitting: Internal Medicine

## 2013-08-13 ENCOUNTER — Telehealth (INDEPENDENT_AMBULATORY_CARE_PROVIDER_SITE_OTHER): Payer: Self-pay | Admitting: *Deleted

## 2013-08-13 VITALS — BP 132/68 | HR 118 | Temp 97.4°F | Resp 16 | Ht 63.0 in | Wt 121.1 lb

## 2013-08-13 DIAGNOSIS — R634 Abnormal weight loss: Secondary | ICD-10-CM

## 2013-08-13 DIAGNOSIS — R110 Nausea: Secondary | ICD-10-CM

## 2013-08-13 DIAGNOSIS — R197 Diarrhea, unspecified: Secondary | ICD-10-CM

## 2013-08-13 LAB — COMPREHENSIVE METABOLIC PANEL
ALBUMIN: 4.1 g/dL (ref 3.5–5.2)
ALK PHOS: 81 U/L (ref 39–117)
ALT: 19 U/L (ref 0–35)
AST: 16 U/L (ref 0–37)
BILIRUBIN TOTAL: 0.5 mg/dL (ref 0.2–1.2)
BUN: 10 mg/dL (ref 6–23)
CO2: 28 meq/L (ref 19–32)
Calcium: 9.5 mg/dL (ref 8.4–10.5)
Chloride: 102 mEq/L (ref 96–112)
Creat: 0.77 mg/dL (ref 0.50–1.10)
Glucose, Bld: 123 mg/dL — ABNORMAL HIGH (ref 70–99)
POTASSIUM: 3.8 meq/L (ref 3.5–5.3)
SODIUM: 140 meq/L (ref 135–145)
TOTAL PROTEIN: 7 g/dL (ref 6.0–8.3)

## 2013-08-13 LAB — CBC
HCT: 46.1 % — ABNORMAL HIGH (ref 36.0–46.0)
Hemoglobin: 15.9 g/dL — ABNORMAL HIGH (ref 12.0–15.0)
MCH: 32.7 pg (ref 26.0–34.0)
MCHC: 34.5 g/dL (ref 30.0–36.0)
MCV: 94.9 fL (ref 78.0–100.0)
PLATELETS: 243 10*3/uL (ref 150–400)
RBC: 4.86 MIL/uL (ref 3.87–5.11)
RDW: 13.3 % (ref 11.5–15.5)
WBC: 7.2 10*3/uL (ref 4.0–10.5)

## 2013-08-13 MED ORDER — LOPERAMIDE HCL 2 MG PO TABS: 1.0000 mg | ORAL_TABLET | Freq: Two times a day (BID) | ORAL | Status: DC

## 2013-08-13 MED ORDER — ONDANSETRON HCL 4 MG PO TABS: 4.0000 mg | ORAL_TABLET | Freq: Two times a day (BID) | ORAL | Status: DC

## 2013-08-13 MED ORDER — FAMOTIDINE 20 MG PO TABS: 20.0000 mg | ORAL_TABLET | Freq: Two times a day (BID) | ORAL | Status: DC

## 2013-08-13 MED ORDER — RIFAXIMIN 550 MG PO TABS: 550.0000 mg | ORAL_TABLET | Freq: Two times a day (BID) | ORAL | Status: DC

## 2013-08-13 NOTE — Patient Instructions (Signed)
Physician will call with results of blood test. Increase intake of by mouth fluids.

## 2013-08-13 NOTE — Telephone Encounter (Signed)
Patient seen this morning

## 2013-08-13 NOTE — Progress Notes (Signed)
Presenting complaint;  Nausea, diarrhea anorexia and weight loss.  Subjective:  Patient is 80 year old Caucasian female who has history of IBS, depression rheumatoid arthritis who was last seen 2 weeks ago with nausea and diarrhea. Diarrhea was felt to be due to irritable bowel syndrome and nausea was felt to be related to her coming off nondetailed for shoulder surgery. She was given prescription for Levsin and ondansetron felt no better. Patient's daughter-in-law Angelique Blonder called yesterday and requested her to be reevaluated. Patient is accompanied by Angelique Blonder. She does not feel any better. She had to come off Nardil for right shoulder surgery which she had on 08/07/2013. She was begun on mirtazapine yesterday. She says she's been under a lot of stress on the account of her husband's illness with memory impairment. She says she's been having more diarrhea for the last 5 months. Nausea started several weeks ago and is described to be intractable. She has no appetite but she denies vomiting. She is having at least 3-4 stools per day. First stool is formed or semi-formed and subsequent stools are loose and watery. She denies melena or rectal bleeding. She also denies fever or chills. There is no history of recent antibiotic use or travel outside the country. She denies abdominal pain or nocturnal diarrhea. She also reports dysphagia but has not experienced any episode of food impaction. She has been on methotrexate for 5 months but does not believe it source of her symptoms. She has lost close to 12 pounds in the last couple of months. She also complains of dry mouth. She denies heartburn or chest pain. She is using Imodium on as-needed basis. She also reports very foul-smelling stool.  Current Medications: Outpatient Encounter Prescriptions as of 08/13/2013  Medication Sig  . bifidobacterium infantis (ALIGN) capsule Take 1 capsule by mouth daily.  Marland Kitchen levothyroxine (SYNTHROID) 75 MCG tablet Take 75 mcg by  mouth daily.  . mirtazapine (REMERON) 30 MG tablet Take 30 mg by mouth at bedtime.  . Cyanocobalamin (VITAMIN B-12) 1000 MCG SUBL Place under the tongue daily.  . folic acid (FOLVITE) 1 MG tablet Take 1 mg by mouth daily.  . Methotrexate, Anti-Rheumatic, (METHOTREXATE, PF, Brenda) Inject into the skin.  Marland Kitchen ondansetron (ZOFRAN) 4 MG tablet Take 1 tablet (4 mg total) by mouth every 8 (eight) hours as needed for nausea or vomiting.  . [DISCONTINUED] hyoscyamine (LEVSIN SL) 0.125 MG SL tablet Place 1 tablet (0.125 mg total) under the tongue 3 (three) times daily.  . [DISCONTINUED] phenelzine (NARDIL) 15 MG tablet Take 15 mg by mouth 2 (two) times daily.    Objective: Blood pressure 132/68, pulse 118, temperature 97.4 F (36.3 C), temperature source Oral, resp. rate 16, height 5\' 3"  (1.6 m), weight 121 lb 1.6 oz (54.931 kg). Patient is alert and in no acute distress.  She is very quiet with flat affect. Conjunctiva is pink. Sclera is nonicteric Oropharyngeal mucosa is normal. No neck masses or thyromegaly noted. Cardiac exam with regular rhythm normal S1 and S2. No murmur or gallop noted. Lungs are clear to auscultation. Abdomen is flat. Bowel sounds are normal. On palpation abdomen is soft and nontender. No organomegaly or masses noted.  No LE edema or clubbing noted.  Labs/studies Results: She had ultrasound on 04/29/2011 and was negative for cholelithiasis. Last colonoscopy was in February 2014 revealing wide-open colonic anastomosis at 22 cm from the anal margin and few diverticula in the vicinity she had patchy colitis involving descending colon and biopsy was consistent with ischemic  colitis.    Assessment:  #1. Diarrhea. This symptom appears to be typical of irritable bowel syndrome except to an odor has changed. If she does not respond to therapy will need further workup to rule out colonic or small bowel disease. #2. Nausea anorexia and weight loss. Suspect these symptoms are secondary  to depression or methotrexate; will monitor closely and make sure she does not have biliary tract disease or peptic ulcer disease. #3. Solid food dysphagia. She does not have typical symptoms of GERD. She could have esophageal motility disorder.    Plan:  Patient will go to lab for CBC and comprehensive chemistry panel. Patient device to increase by mouth intake of fluids. Ondansetron 4 mg by mouth twice a day. Imodium OTC 2 mg by mouth twice a day. Pepcid 20 mg by mouth twice a day(OTC). Xifaxan 1100 mg per day (366 mg 3 times a day) 10 days. If nausea anorexia does not improve with therapy we'll consider EGD. Office visit in 2 weeks.

## 2013-08-13 NOTE — Telephone Encounter (Signed)
Erica [name]'s daughter-in-law call stating her mother is no better and needs to be seen by Dr. Karilyn Cota. "If she can not see Dr. Karilyn Cota, they will  request her records and go else where." Erica [name] is weak, nauseated and has trouble eating. Erica [name] Pick that Dr. Karilyn Cota was consulted at her visited on 07/29/13 and should have called in with a progress report. Per Erica [name] "her physic doctor told them if [name] could not get in to be seen by her doctor, he had someone she could go to see and would not have to see a NP." Erica [name] in early the next day with Dr. Karilyn Cota.

## 2013-08-14 ENCOUNTER — Ambulatory Visit (HOSPITAL_COMMUNITY)
Admission: RE | Admit: 2013-08-14 | Discharge: 2013-08-14 | Disposition: A | Discharge status: Home / Self Care | Payer: Medicare Other | Source: Ambulatory Visit | Attending: Orthopedic Surgery | Admitting: Orthopedic Surgery

## 2013-08-14 DIAGNOSIS — IMO0001 Reserved for inherently not codable concepts without codable children: Secondary | ICD-10-CM | POA: Insufficient documentation

## 2013-08-14 DIAGNOSIS — Z9889 Other specified postprocedural states: Secondary | ICD-10-CM

## 2013-08-14 DIAGNOSIS — M25619 Stiffness of unspecified shoulder, not elsewhere classified: Secondary | ICD-10-CM | POA: Insufficient documentation

## 2013-08-14 DIAGNOSIS — M25519 Pain in unspecified shoulder: Secondary | ICD-10-CM | POA: Insufficient documentation

## 2013-08-14 DIAGNOSIS — M25511 Pain in right shoulder: Secondary | ICD-10-CM

## 2013-08-14 DIAGNOSIS — M75101 Unspecified rotator cuff tear or rupture of right shoulder, not specified as traumatic: Secondary | ICD-10-CM

## 2013-08-14 DIAGNOSIS — M6281 Muscle weakness (generalized): Secondary | ICD-10-CM | POA: Insufficient documentation

## 2013-08-14 NOTE — Evaluation (Signed)
Occupational Therapy Evaluation  Patient Details  Name: MERCI WALTHERS MRN: [id number] Date of Birth: 11-03-1932  Today's Date: 08/14/2013 Time: 1525-1610 OT Time Calculation (min): 45 min OT evaluation 1884-1660 20' Manual therapy 1545-1610 25' Visit#: 1 of 12  Re-eval: 09/11/13  Assessment Diagnosis: S/P Right SAD, DCE Surgical Date: 08/07/13 Next MD Visit: one month Prior Therapy: 5 sessions in April prior to surgery  Authorization: BCBS Medicare  Authorization Time Period: before 10th visit  Authorization Visit#: 1 of 10   Past Medical History:  Past Medical History  Diagnosis Date  . Irritable bowel syndrome   . Thyroid condition   . Depression   . RA (rheumatoid arthritis)    Past Surgical History:  Past Surgical History  Procedure Laterality Date  . Colostomy    . Abdominal hysterectomy    . Colonoscopy    . Upper gastrointestinal endoscopy    . Eye surgery  cataract x 2  . Colonoscopy N/A 05/08/2012    Procedure: COLONOSCOPY;  Surgeon: Malissa Hippo, MD;  Location: [facility];  Service: Endoscopy;  Laterality: N/A;  730  . Bone spurs  08/07/13    Right Shoulder    Subjective  S:  I had surgery a week ago, he said I was a few weeks away from a complete rotator cuff tear. Pertinent History: Mrs. Swanton attended therapy for shoulder pain from 4/3-4/21 and then had an MRI which detected a partial rotator cuff tear in her right shoulder.  She consulted with Dr. Sherlean Foot and had surgery for right SAD/DE on 08/07/13.  She wore a sling for one week and has been referred to ocupatinal therapy for evaluation and tratment.   Limitations: progress as tolerated Special Tests: FOTO 60% independent.   Patient Stated Goals: "To use my arm without pain." Pain Assessment Currently in Pain?: Yes Pain Score: 3  Pain Location: Shoulder Pain Orientation: Right Pain Type: Acute pain  Precautions/Restrictions  Precautions Precautions: None Restrictions Weight Bearing  Restrictions: No  Balance Screening Balance Screen Has the patient fallen in the past 6 months: No Has the patient had a decrease in activity level because of a fear of falling? : No Is the patient reluctant to leave their home because of a fear of falling? : No  Prior Functioning  Home Living Family/patient expects to be discharged to:: Private residence Living Arrangements: Spouse/significant other Prior Function Level of Independence: Independent with basic ADLs;Independent with homemaking with ambulation Driving: Yes Vocation: Retired Comments: enjoys Clinical cytogeneticist, gardening, and Geophysical data processor ties  Assessment ADL/Vision/Perception ADL ADL Comments: not using her right arm as dominat with her daily activities   Dominant Hand: Right Vision - History Baseline Vision: Wears glasses all the time  Cognition/Observation Cognition Orientation Level: Oriented X4  Sensation/Coordination/Edema Sensation Light Touch: Appears Intact Coordination Gross Motor Movements are Fluid and Coordinated: Yes Fine Motor Movements are Fluid and Coordinated: Yes  Additional Assessments RUE AROM (degrees) RUE Overall AROM Comments: assessed in supine ER/IR with shoulder adducted Right Shoulder Flexion: 115 Degrees Right Shoulder ABduction: 70 Degrees Right Shoulder Internal Rotation: 90 Degrees Right Shoulder External Rotation: 40 Degrees RUE PROM (degrees) RUE Overall PROM Comments: assessed in supine ERIR with shoulder adducted Right Shoulder Flexion: 150 Degrees Right Shoulder ABduction: 150 Degrees Right Shoulder Internal Rotation: 90 Degrees Right Shoulder External Rotation: 75 Degrees RUE Strength RUE Overall Strength Comments: not assessed due to recent surgery Palpation Palpation: moderate fascial restrictions noted this date in right shoulder and upper arm region.  Exercise/Treatments Supine Protraction: PROM;10 reps Horizontal ABduction: PROM;10 reps External  Rotation: PROM;10 reps Internal Rotation: PROM;10 reps Flexion: PROM;10 reps ABduction: PROM;10 reps        Manual Therapy Manual Therapy: Myofascial release Myofascial Release: Myofascial release (MFR) and manual stretching to right upper arm, scapular region, shoulder and upper trapezius region to decrease pain and fascial restrictions and improve pain free mobility.   Occupational Therapy Assessment and Plan OT Assessment and Plan Clinical Impression Statement: A:  Patient presents s/p right sad/dce on 08/07/13 with decreased mobility and strength and increased pain and fascial restrictions.  Pt will benefit from skilled therapeutic intervention in order to improve on the following deficits: Decreased range of motion;Increased fascial restricitons;Decreased strength;Increased muscle spasms;Pain Rehab Potential: Good Clinical Impairments Affecting Rehab Potential: motivation severity of injury OT Frequency: Min 2X/week OT Duration: 6 weeks OT Treatment/Interventions: Self-care/ADL training;Therapeutic exercise;Neuromuscular education;Modalities;Manual therapy;Therapeutic activities;Patient/family education OT Plan: P:  Skilled OT intervention to decrease pain and fascial restrictions and increase pain free mobility in her right shoulder in order to return to prior level of independence with all B/IADLs and work activities. Treatment Plan: MFR and manual stretching  PROM and AAROM in supine, isometric strengthening, ball stretches, scapular stability, progress as tolerated.   Goals Short Term Goals Time to Complete Short Term Goals: 3 weeks Short Term Goal 1: Patient will be educated on a HEP. Short Term Goal 1 Progress: Progressing toward goal Short Term Goal 2: Patient will improve PROM to [facility] in her right shoulder for increased ability to curl her hair and remove her blouse without difficulty. Short Term Goal 2 Progress: Progressing toward goal Short Term Goal 3: Patient will improve  right shoulder strength to 3+/5 for increased ability to lift bags of grocery out of her car. Short Term Goal 3 Progress: Progressing toward goal Short Term Goal 4: Patient will decrease pain in her right shoulder to2/10 when completing daily activities.  Short Term Goal 4 Progress: Progressing toward goal Short Term Goal 5: Patient will decrease fascial restrictions to min-mod in her right shoulder region.  Short Term Goal 5 Progress: Progressing toward goal Long Term Goals Time to Complete Long Term Goals: 6 weeks Long Term Goal 1: Patient will return to prior level of independence with all B/IADLs and leisure activities. Long Term Goal 2: Patient will improve AROM to WNL in her right shoulder for increased ability to curl her hair and remove her blouse without difficulty. Long Term Goal 3: Patient will improve right shoulder strength to 5/5 for increased ability to shift gears in her husbands car. Long Term Goal 4: Patient will decrease pain in her right shoulder to 2/10 when completing daily activities.  Long Term Goal 5: Patient will decrease fascial restrictions to min in her right shoulder region.   Problem List Patient Active Problem List   Diagnosis Date Noted  . Status post subacromial decompression 08/14/2013  . Muscle weakness (generalized) 06/14/2013  . Rotator cuff syndrome of right shoulder 12/05/2012  . Right shoulder pain 12/05/2012  . Rectal bleeding 06/06/2011  . Epigastric pain 04/19/2011  . GERD (gastroesophageal reflux disease) 04/19/2011  . IBS (irritable bowel syndrome) 04/19/2011  . METATARSALGIA 05/12/2009  . BUNION 05/12/2009    End of Session Activity Tolerance: Patient tolerated treatment well General Behavior During Therapy: [facility] for tasks assessed/performed OT Plan of Care OT Home Exercise Plan: pendulums and table slides.  OT Patient Instructions: demonstrated, returned demonstration, scanned Consulted and Agree with Plan of Care:  Patient  GO Functional Assessment Tool Used: FOTO scored 60% independent Functional Limitation: Carrying, moving and handling objects Carrying, Moving and Handling Objects Current Status ([id number]): At least 20 percent but less than 40 percent impaired, limited or restricted Carrying, Moving and Handling Objects Goal Status [phone]): At least 1 percent but less than 20 percent impaired, limited or restricted  Shirlean Mylar, OTR/L [phone]  08/14/2013, 9:44 PM  Physician Documentation Your signature is required to indicate approval of the treatment plan as stated above.  Please sign and either send electronically or make a copy of this report for your files and return this physician signed original.  Please mark one 1.__approve of plan  2. ___approve of plan with the following conditions.   ______________________________                                                          _____________________ Physician Signature                                                                                                             Date

## 2013-08-15 ENCOUNTER — Ambulatory Visit (HOSPITAL_COMMUNITY)
Admission: RE | Admit: 2013-08-15 | Discharge: 2013-08-15 | Disposition: A | Discharge status: Home / Self Care | Payer: Medicare Other | Source: Ambulatory Visit | Attending: Specialist | Admitting: Specialist

## 2013-08-15 DIAGNOSIS — Z9889 Other specified postprocedural states: Secondary | ICD-10-CM

## 2013-08-15 NOTE — Progress Notes (Signed)
Occupational Therapy Treatment Patient Details  Name: Erica [name] MRN: [id number] Date of Birth: 03-28-1932  Today's Date: 08/15/2013 Time: 1950-9326 OT Time Calculation (min): 43 min Manual therapy [id number] 24' Therapeutic exercises [phone] 49' Visit#: 2 of 12  Re-eval: 09/11/13    Authorization: BCBS Medicare  Authorization Time Period: before 10th visit  Authorization Visit#: 2 of 10  Subjective  S:  I feel really nauseated today. Pain Assessment Currently in Pain?: Yes Pain Score: 3  Pain Location: Shoulder Pain Orientation: Right Pain Type: Acute pain  Precautions/Restrictions   progress as tolerated  Exercise/Treatments Supine Protraction: PROM;AAROM;10 reps Horizontal ABduction: PROM;10 reps External Rotation: PROM;AAROM;10 reps Internal Rotation: PROM;AAROM;10 reps Flexion: PROM;AAROM;10 reps ABduction: PROM;10 reps Seated Elevation: AROM;10 reps Extension: AROM;10 reps Retraction: AROM;10 reps Other Seated Exercises: elbow flexion and extension 10 times Therapy Ball Flexion: 15 reps ABduction: 15 reps Isometric Strengthening   Flexion: Supine;3X3" Extension: Supine;3X3" External Rotation: Supine;3X3" Internal Rotation: Supine;3X3" ABduction: Supine;3X3" ADduction: Supine;3X3"    Manual Therapy Manual Therapy: Myofascial release Myofascial Release: Myofascial release (MFR) and manual stretching to right upper arm, scapular region, shoulder and upper trapezius region to decrease pain and fascial restrictions and improve pain free mobility.   Occupational Therapy Assessment and Plan OT Assessment and Plan Clinical Impression Statement: A:  Patient with near full PROM this date.  Patient began isometrics and AAROM this date with good form and low level of pain.  OT Plan: P:  Attempt pulleys, decrease pain with end range PROM.   Goals Short Term Goals Time to Complete Short Term Goals: 3 weeks Short Term Goal 1: Patient will be educated on a  HEP. Short Term Goal 1 Progress: Progressing toward goal Short Term Goal 2: Patient will improve PROM to [facility] in her right shoulder for increased ability to curl her hair and remove her blouse without difficulty. Short Term Goal 2 Progress: Progressing toward goal Short Term Goal 3: Patient will improve right shoulder strength to 3+/5 for increased ability to lift bags of grocery out of her car. Short Term Goal 3 Progress: Progressing toward goal Short Term Goal 4: Patient will decrease pain in her right shoulder to2/10 when completing daily activities.  Short Term Goal 4 Progress: Progressing toward goal Short Term Goal 5: Patient will decrease fascial restrictions to min-mod in her right shoulder region.  Short Term Goal 5 Progress: Progressing toward goal Long Term Goals Time to Complete Long Term Goals: 6 weeks Long Term Goal 1: Patient will return to prior level of independence with all B/IADLs and leisure activities. Long Term Goal 1 Progress: Progressing toward goal Long Term Goal 2: Patient will improve AROM to WNL in her right shoulder for increased ability to curl her hair and remove her blouse without difficulty. Long Term Goal 2 Progress: Progressing toward goal Long Term Goal 3: Patient will improve right shoulder strength to 5/5 for increased ability to shift gears in her husbands car. Long Term Goal 3 Progress: Progressing toward goal Long Term Goal 4: Patient will decrease pain in her right shoulder to 2/10 when completing daily activities.  Long Term Goal 4 Progress: Progressing toward goal Long Term Goal 5: Patient will decrease fascial restrictions to min in her right shoulder region.  Long Term Goal 5 Progress: Progressing toward goal  Problem List Patient Active Problem List   Diagnosis Date Noted  . Status post subacromial decompression 08/14/2013  . Muscle weakness (generalized) 06/14/2013  . Rotator cuff syndrome of right shoulder 12/05/2012  .  Right shoulder pain  12/05/2012  . Rectal bleeding 06/06/2011  . Epigastric pain 04/19/2011  . GERD (gastroesophageal reflux disease) 04/19/2011  . IBS (irritable bowel syndrome) 04/19/2011  . METATARSALGIA 05/12/2009  . BUNION 05/12/2009    End of Session Activity Tolerance: Patient tolerated treatment well General Behavior During Therapy: [facility] for tasks assessed/performed  GO    Shirlean Mylar, OTR/L [phone]  08/15/2013, 3:43 PM

## 2013-08-19 ENCOUNTER — Ambulatory Visit (HOSPITAL_COMMUNITY)
Admission: RE | Admit: 2013-08-19 | Discharge: 2013-08-19 | Disposition: A | Discharge status: Home / Self Care | Payer: Medicare Other | Source: Ambulatory Visit | Attending: Internal Medicine | Admitting: Internal Medicine

## 2013-08-19 DIAGNOSIS — Z9889 Other specified postprocedural states: Secondary | ICD-10-CM

## 2013-08-19 NOTE — Progress Notes (Signed)
Occupational Therapy Treatment Patient Details  Name: Erica [name] MRN: [id number] Date of Birth: [birth year]  Today's Date: 08/19/2013 Time: 1020-1100 OT Time Calculation (min): 40 min MFR 1020-1030 10' Therex 1030-1100 30'  Visit#: 3 of 12  Re-eval: 09/11/13    Authorization: BCBS Medicare  Authorization Time Period: before 10th visit  Authorization Visit#: 3 of 10  Subjective Symptoms/Limitations Symptoms: S: I started a new antidepression medicine and it makes me really shakey. I almost didn't come today.  Pain Assessment Currently in Pain?: No/denies  Precautions/Restrictions  Precautions Precautions: None  Exercise/Treatments Supine Protraction: PROM;AAROM;10 reps Horizontal ABduction: PROM;AAROM;10 reps External Rotation: PROM;AAROM;10 reps Internal Rotation: PROM;AAROM;10 reps Flexion: PROM;AAROM;10 reps ABduction: AROM;AAROM;10 reps Seated Elevation: AROM;12 reps Extension: AROM;12 reps Row: AROM;12 reps Protraction: AAROM;10 reps Horizontal ABduction: AAROM;10 reps External Rotation: AAROM;10 reps Internal Rotation: AAROM;10 reps Flexion: AAROM;10 reps Pulleys Flexion: 1 minute ABduction: 1 minute ROM / Strengthening / Isometric Strengthening   Flexion: Supine;3X3" Extension: Supine;3X3" External Rotation: Supine;3X3" Internal Rotation: Supine;3X3" ABduction: Supine;3X3" ADduction: Supine;3X3"    Manual Therapy Manual Therapy: Myofascial release Myofascial Release: Myofascial release (MFR) and manual stretching to right upper arm, scapular region, shoulder and upper trapezius region to decrease pain and fascial restrictions and improve pain free mobility.   Occupational Therapy Assessment and Plan OT Assessment and Plan Clinical Impression Statement: A: Added AAROM seated and pulleys. Patient tolerated well with minimal pain.  OT Plan: P: Complete all AAROM exercises. Wall wash   Goals Short Term Goals Time to Complete Short Term Goals: 3  weeks Short Term Goal 1: Patient will be educated on a HEP. Short Term Goal 1 Progress: Progressing toward goal Short Term Goal 2: Patient will improve PROM to [facility] in her right shoulder for increased ability to curl her hair and remove her blouse without difficulty. Short Term Goal 2 Progress: Progressing toward goal Short Term Goal 3: Patient will improve right shoulder strength to 3+/5 for increased ability to lift bags of grocery out of her car. Short Term Goal 3 Progress: Progressing toward goal Short Term Goal 4: Patient will decrease pain in her right shoulder to2/10 when completing daily activities.  Short Term Goal 4 Progress: Progressing toward goal Short Term Goal 5: Patient will decrease fascial restrictions to min-mod in her right shoulder region.  Short Term Goal 5 Progress: Progressing toward goal Long Term Goals Time to Complete Long Term Goals: 6 weeks Long Term Goal 1: Patient will return to prior level of independence with all B/IADLs and leisure activities. Long Term Goal 1 Progress: Progressing toward goal Long Term Goal 2: Patient will improve AROM to WNL in her right shoulder for increased ability to curl her hair and remove her blouse without difficulty. Long Term Goal 2 Progress: Progressing toward goal Long Term Goal 3: Patient will improve right shoulder strength to 5/5 for increased ability to shift gears in her husbands car. Long Term Goal 3 Progress: Progressing toward goal Long Term Goal 4: Patient will decrease pain in her right shoulder to 2/10 when completing daily activities.  Long Term Goal 4 Progress: Progressing toward goal Long Term Goal 5: Patient will decrease fascial restrictions to min in her right shoulder region.  Long Term Goal 5 Progress: Progressing toward goal  Problem List Patient Active Problem List   Diagnosis Date Noted  . Status post subacromial decompression 08/14/2013  . Muscle weakness (generalized) 06/14/2013  . Rotator cuff  syndrome of right shoulder 12/05/2012  . Right shoulder pain 12/05/2012  .  Rectal bleeding 06/06/2011  . Epigastric pain 04/19/2011  . GERD (gastroesophageal reflux disease) 04/19/2011  . IBS (irritable bowel syndrome) 04/19/2011  . METATARSALGIA 05/12/2009  . BUNION 05/12/2009    End of Session Activity Tolerance: Patient tolerated treatment well General Behavior During Therapy: [facility] for tasks assessed/performed   Limmie Patricia, OTR/L,CBIS   08/19/2013, 10:58 AM

## 2013-08-21 ENCOUNTER — Ambulatory Visit (HOSPITAL_COMMUNITY)
Admission: RE | Admit: 2013-08-21 | Discharge: 2013-08-21 | Disposition: A | Discharge status: Home / Self Care | Payer: Medicare Other | Source: Ambulatory Visit | Attending: Internal Medicine | Admitting: Internal Medicine

## 2013-08-21 DIAGNOSIS — Z9889 Other specified postprocedural states: Secondary | ICD-10-CM

## 2013-08-21 NOTE — Progress Notes (Signed)
Occupational Therapy Treatment Patient Details  Name: Erica [name] MRN: [id number] Date of Birth: 1932/04/02  Today's Date: 08/21/2013 Time: 1025-1100 OT Time Calculation (min): 35 min Manual therapy 1025-1039 14' Therapeutic exercises 1039-1100 21' Visit#: 4 of 12  Re-eval: 09/11/13    Authorization: BCBS Medicare  Authorization Time Period: before 10th visit  Authorization Visit#: 4 of 10  Subjective Symptoms/Limitations Symptoms: S:  My shoulder is ok. Limitations: progress as tolerated Pain Assessment Currently in Pain?: Yes Pain Score: 2  Pain Location: Shoulder Pain Type: Acute pain  Precautions/Restrictions     Exercise/Treatments Supine Protraction: PROM;10 reps;AAROM;15 reps Horizontal ABduction: PROM;10 reps;AAROM;15 reps External Rotation: PROM;10 reps;AAROM;15 reps Internal Rotation: PROM;10 reps;AAROM;15 reps Flexion: PROM;10 reps;AAROM;15 reps ABduction: PROM;10 reps;AAROM;15 reps Seated Elevation: AROM;15 reps Extension: AROM;15 reps Row: AROM;15 reps Protraction: AAROM;15 reps Horizontal ABduction: AAROM;15 reps External Rotation: AAROM;15 reps Internal Rotation: AAROM;15 reps Flexion: AAROM;15 reps Abduction: AAROM;15 reps    Manual Therapy Manual Therapy: Myofascial release Myofascial Release: Myofascial release (MFR) and manual stretching to right upper arm, scapular region, shoulder and upper trapezius region to decrease pain and fascial restrictions and improve pain free mobility.   Occupational Therapy Assessment and Plan OT Assessment and Plan Clinical Impression Statement: A:  Patient demonstrates full AAROM in supine and seated this date.  OT Plan: P:  transition from AAROM to AROM, add wall wash and ball circles.  Add shoulder stretches to HEP.   Goals Short Term Goals Time to Complete Short Term Goals: 3 weeks Short Term Goal 1: Patient will be educated on a HEP. Short Term Goal 2: Patient will improve PROM to [facility] in her right  shoulder for increased ability to curl her hair and remove her blouse without difficulty. Short Term Goal 3: Patient will improve right shoulder strength to 3+/5 for increased ability to lift bags of grocery out of her car. Short Term Goal 4: Patient will decrease pain in her right shoulder to2/10 when completing daily activities.  Short Term Goal 5: Patient will decrease fascial restrictions to min-mod in her right shoulder region.  Long Term Goals Time to Complete Long Term Goals: 6 weeks Long Term Goal 1: Patient will return to prior level of independence with all B/IADLs and leisure activities. Long Term Goal 2: Patient will improve AROM to WNL in her right shoulder for increased ability to curl her hair and remove her blouse without difficulty. Long Term Goal 3: Patient will improve right shoulder strength to 5/5 for increased ability to shift gears in her husbands car. Long Term Goal 4: Patient will decrease pain in her right shoulder to 2/10 when completing daily activities.  Long Term Goal 5: Patient will decrease fascial restrictions to min in her right shoulder region.   Problem List Patient Active Problem List   Diagnosis Date Noted  . Status post subacromial decompression 08/14/2013  . Muscle weakness (generalized) 06/14/2013  . Rotator cuff syndrome of right shoulder 12/05/2012  . Right shoulder pain 12/05/2012  . Rectal bleeding 06/06/2011  . Epigastric pain 04/19/2011  . GERD (gastroesophageal reflux disease) 04/19/2011  . IBS (irritable bowel syndrome) 04/19/2011  . METATARSALGIA 05/12/2009  . BUNION 05/12/2009    End of Session Activity Tolerance: Patient tolerated treatment well General Behavior During Therapy: [facility] for tasks assessed/performed  GO    Shirlean Mylar, OTR/L [phone]  08/21/2013, 11:37 AM

## 2013-08-22 ENCOUNTER — Encounter (INDEPENDENT_AMBULATORY_CARE_PROVIDER_SITE_OTHER): Payer: Self-pay | Admitting: Internal Medicine

## 2013-08-22 ENCOUNTER — Ambulatory Visit (INDEPENDENT_AMBULATORY_CARE_PROVIDER_SITE_OTHER): Payer: Medicare Other | Admitting: Internal Medicine

## 2013-08-22 VITALS — BP 140/96 | HR 128 | Temp 97.0°F | Resp 18 | Ht 63.0 in | Wt 122.3 lb

## 2013-08-22 DIAGNOSIS — R000 Tachycardia, unspecified: Secondary | ICD-10-CM

## 2013-08-22 DIAGNOSIS — R110 Nausea: Secondary | ICD-10-CM

## 2013-08-22 DIAGNOSIS — K589 Irritable bowel syndrome without diarrhea: Secondary | ICD-10-CM

## 2013-08-22 MED ORDER — ONDANSETRON HCL 4 MG PO TABS: 4.0000 mg | ORAL_TABLET | Freq: Two times a day (BID) | ORAL | Status: DC | PRN

## 2013-08-22 NOTE — Patient Instructions (Addendum)
Continue Xifaxan for one more week and then stop. Use ondansetron an as-needed basis. EKG at Dr. Alonza Smoker office today

## 2013-08-22 NOTE — Progress Notes (Signed)
Presenting complaint;  Followup for nausea, diarrhea and weight loss.  Subjective:  Patient is 80 year old Caucasian female who is here for scheduled visit for reevaluation of nausea diarrhea and weight loss. She was last seen one week ago. She is accompanied by her daughter-in-law the knees. She feels better. She still does not have intense but she is trying to eat 3 meals a day. Her weight is up by 1 pound. She has not had diarrhea or urgency. She is passing semi-formed stools. She remains extremely worried about her husband who was recently diagnosed with diabetes mellitus. She is aware that her heart rate is fast. However she denies chest pain shortness of breath or postural symptoms. She says her heart rate runs between 90 and 100 most of the time. She hasn't experienced any nausea this week and wonders if she can stop ondansetron or use it on an as-needed basis.   Current Medications: Outpatient Encounter Prescriptions as of 08/22/2013  Medication Sig  . famotidine (PEPCID) 20 MG tablet Take 1 tablet (20 mg total) by mouth 2 (two) times daily.  Marland Kitchen levothyroxine (SYNTHROID) 75 MCG tablet Take 75 mcg by mouth daily.  Marland Kitchen loperamide (IMODIUM A-D) 2 MG tablet Take 0.5 tablets (1 mg total) by mouth 2 (two) times daily.  Marland Kitchen LORazepam (ATIVAN) 0.5 MG tablet Take 0.5 mg by mouth every 8 (eight) hours. Patient states that she takes 1/2 of this tablet on a prn basis.  Marland Kitchen mirtazapine (REMERON) 30 MG tablet Take 30 mg by mouth at bedtime.  . rifaximin (XIFAXAN) 550 MG TABS tablet Take 1 tablet (550 mg total) by mouth 2 (two) times daily.  . bifidobacterium infantis (ALIGN) capsule Take 1 capsule by mouth daily.  . Cyanocobalamin (VITAMIN B-12) 1000 MCG SUBL Place under the tongue daily.  . folic acid (FOLVITE) 1 MG tablet Take 1 mg by mouth daily.  . Methotrexate, Anti-Rheumatic, (METHOTREXATE, PF, [place]) Inject into the skin.  Marland Kitchen ondansetron (ZOFRAN) 4 MG tablet Take 1 tablet (4 mg total) by mouth 2 (two)  times daily.     Objective: Blood pressure 140/96, pulse 128, temperature 97 F (36.1 C), temperature source Oral, resp. rate 18, height '5\' 3"'  (1.6 m), weight 122 lb 4.8 oz (55.475 kg). Patient is alert and in no acute distress. Conjunctiva is pink. Sclera is nonicteric Oropharyngeal mucosa is normal. No neck masses or thyromegaly noted. Cardiac exam with regular rhythm normal S1 and S2. No murmur or gallop noted. Lungs are clear to auscultation. Abdomen symmetrical. Bowel sounds are normal. Palpation is soft and nontender without organomegaly or masses  No LE edema or clubbing noted.  Labs/studies Results: CBC    Component Value Date/Time   WBC 7.2 08/13/2013 0940   RBC 4.86 08/13/2013 0940   HGB 15.9* 08/13/2013 0940   HCT 46.1* 08/13/2013 0940   PLT 243 08/13/2013 0940   MCV 94.9 08/13/2013 0940   MCH 32.7 08/13/2013 0940   MCHC 34.5 08/13/2013 0940   RDW 13.3 08/13/2013 0940   LYMPHSABS 1.7 05/03/2012 1147   MONOABS 1.7* 05/03/2012 1147   EOSABS 0.0 05/03/2012 1147   BASOSABS 0.0 05/03/2012 1147   Hepatic Function Latest Ref Rng 08/13/2013  Total Protein 6.0 - 8.3 g/dL 7.0  Albumin 3.5 - 5.2 g/dL 4.1  AST 0 - 37 U/L 16  ALT 0 - 35 U/L 19  Alk Phosphatase 39 - 117 U/L 81  Total Bilirubin 0.2 - 1.2 mg/dL 0.5     Assessment:  #1. Nausea. Nausea  has resolved. Symptom could have been secondary to GERD IBS or stress. #2. Diarrhea. She is doing much better with therapy. She is felt to have flareup of IBS. #3. Rapid heart rate. He does not have postural symptoms. #4. Weight loss. Weight loss is leveled off and hopefully she will start gaining weight now that she is able to eat.   Plan:  Continue Xifaxan for another one week. Use Ondansetron an as-needed basis. She'll go to Dr. Ria Comment office for an EKG and further workup rapid heart. OV in 4 weeks.

## 2013-08-24 DIAGNOSIS — R000 Tachycardia, unspecified: Secondary | ICD-10-CM | POA: Insufficient documentation

## 2013-08-26 ENCOUNTER — Ambulatory Visit (HOSPITAL_COMMUNITY)
Admission: RE | Admit: 2013-08-26 | Discharge: 2013-08-26 | Disposition: A | Discharge status: Home / Self Care | Payer: Medicare Other | Source: Ambulatory Visit | Attending: Internal Medicine | Admitting: Internal Medicine

## 2013-08-26 DIAGNOSIS — Z9889 Other specified postprocedural states: Secondary | ICD-10-CM

## 2013-08-26 NOTE — Progress Notes (Signed)
Occupational Therapy Treatment Patient Details  Name: Erica [name] MRN: [id number] Date of Birth: [birth year]  Today's Date: 08/26/2013 Time: 4388-8757 OT Time Calculation (min): 37 min Manual therapy 9728-2060 16' Therapeutic exercises 1325-1344 19'  Visit#: 5 of 12  Re-eval: 09/11/13    Authorization: BCBS Medicare  Authorization Time Period: before 10th visit  Authorization Visit#: 5 of 10  Subjective S:  I do enjoy painting, its just hard to hold onto the brush. Limitations: progress as tolerated Pain Assessment Currently in Pain?: Yes Pain Score: 1  Pain Location: Shoulder Pain Orientation: Right Pain Type: Acute pain  Precautions/Restrictions   progress as tolerated  Exercise/Treatments Supine Protraction: PROM;AROM;10 reps Horizontal ABduction: PROM;AROM;10 reps External Rotation: PROM;AROM;10 reps Internal Rotation: PROM;AROM;10 reps Flexion: PROM;AROM;10 reps ABduction: PROM;AROM;10 reps Seated Protraction: AROM;10 reps Horizontal ABduction: AROM;10 reps External Rotation: AROM;10 reps Internal Rotation: AROM;10 reps Flexion: AROM;10 reps Abduction: AROM;10 reps Standing External Rotation: Theraband;10 reps Theraband Level (Shoulder External Rotation): Level 2 (Red) Internal Rotation: Theraband;10 reps Theraband Level (Shoulder Internal Rotation): Level 2 (Red) Extension: Theraband;10 reps Theraband Level (Shoulder Extension): Level 2 (Red) Row: Theraband;10 reps Theraband Level (Shoulder Row): Level 2 (Red) Retraction: Theraband;10 reps Theraband Level (Shoulder Retraction): Level 2 (Red) ROM / Strengthening / Isometric Strengthening Wall Wash: 1'  Thumb Tacks: 1' Proximal Shoulder Strengthening, Supine: 10 times without resting Proximal Shoulder Strengthening, Seated: 10 times without resting  Prot/Ret//Elev/Dep: 1'         Manual Therapy Manual Therapy: Myofascial release Myofascial Release: Myofascial release (MFR) and manual  stretching to right upper arm, scapular region, shoulder and upper trapezius region to decrease pain and fascial restrictions and improve pain free mobility.   Occupational Therapy Assessment and Plan OT Assessment and Plan Clinical Impression Statement: A:  Patient transitioned to AROM in supine and seated this date with good form, horizontal abduction most painful movement for her.  OT Plan: P:  Increase time with wall wash, add x to v and w arms for increased proximal shoulder strength.  attempt painting activity with canvas/paper postiioned on wall at shoulder height.    Goals    Problem List Patient Active Problem List   Diagnosis Date Noted  . Rapid heart rate 08/24/2013  . Status post subacromial decompression 08/14/2013  . Muscle weakness (generalized) 06/14/2013  . Rotator cuff syndrome of right shoulder 12/05/2012  . Right shoulder pain 12/05/2012  . Rectal bleeding 06/06/2011  . Epigastric pain 04/19/2011  . GERD (gastroesophageal reflux disease) 04/19/2011  . IBS (irritable bowel syndrome) 04/19/2011  . METATARSALGIA 05/12/2009  . BUNION 05/12/2009    End of Session Activity Tolerance: Patient tolerated treatment well General Behavior During Therapy: [facility] for tasks assessed/performed  [place], OTR/L [phone]  08/26/2013, 1:52 PM

## 2013-08-28 ENCOUNTER — Telehealth (HOSPITAL_COMMUNITY): Payer: Self-pay

## 2013-08-28 ENCOUNTER — Ambulatory Visit (HOSPITAL_COMMUNITY): Payer: Medicare Other | Admitting: Specialist

## 2013-09-02 ENCOUNTER — Ambulatory Visit (HOSPITAL_COMMUNITY): Payer: Medicare Other | Admitting: Specialist

## 2013-09-02 ENCOUNTER — Telehealth (HOSPITAL_COMMUNITY): Payer: Self-pay | Admitting: Specialist

## 2013-09-02 NOTE — Telephone Encounter (Signed)
Patient no show for her visit, attempted to call and was unable to reach patient.

## 2013-09-04 ENCOUNTER — Inpatient Hospital Stay (HOSPITAL_COMMUNITY): Admission: RE | Admit: 2013-09-04 | Payer: Medicare Other | Source: Ambulatory Visit | Admitting: Specialist

## 2013-09-11 ENCOUNTER — Ambulatory Visit (HOSPITAL_COMMUNITY)
Admission: RE | Admit: 2013-09-11 | Discharge: 2013-09-11 | Disposition: A | Discharge status: Home / Self Care | Payer: Medicare Other | Source: Ambulatory Visit | Attending: Internal Medicine | Admitting: Internal Medicine

## 2013-09-11 DIAGNOSIS — IMO0001 Reserved for inherently not codable concepts without codable children: Secondary | ICD-10-CM | POA: Diagnosis present

## 2013-09-11 DIAGNOSIS — M6281 Muscle weakness (generalized): Secondary | ICD-10-CM | POA: Diagnosis not present

## 2013-09-11 DIAGNOSIS — M25519 Pain in unspecified shoulder: Secondary | ICD-10-CM | POA: Insufficient documentation

## 2013-09-11 NOTE — Evaluation (Signed)
Occupational Therapy Re-Evaluation  Patient Details  Name: Erica [name] MRN: [id number] Date of Birth: August 20, 1932  Today's Date: 09/11/2013 Time: 5784-6962 OT Time Calculation (min): 44 min Manual therapy 9528-4132 16' Rom/mmt 4401-0272 10' Therapeutic exercises 1332-18' Visit#: 6 of 12  Re-eval: 09/27/13  Assessment Diagnosis: S/P Right SAD, DCE  Authorization: BCBS Medicare  Authorization Time Period: before 16th visit   Authorization Visit#: 6 of 16   Past Medical History:  Past Medical History  Diagnosis Date  . Irritable bowel syndrome   . Thyroid condition   . Depression   . RA (rheumatoid arthritis)    Past Surgical History:  Past Surgical History  Procedure Laterality Date  . Colostomy    . Abdominal hysterectomy    . Colonoscopy    . Upper gastrointestinal endoscopy    . Eye surgery  cataract x 2  . Colonoscopy N/A 05/08/2012    Procedure: COLONOSCOPY;  Surgeon: Rogene Houston, MD;  Location: [facility];  Service: Endoscopy;  Laterality: N/A;  730  . Bone spurs  08/07/13    Right Shoulder    Subjective  S:  I dont have alot of strength in my shoulder or arm.  I can move it pretty good. Just not strength. Special Tests: FOTO 69% independent Pain Assessment Currently in Pain?: No/denies Pain Score: 0-No pain   Assessment Additional Assessments RUE AROM (degrees) RUE Overall AROM Comments: assessed in seated ER/IR with shoulder adducted (08/14/13 supine ER/IR with shoulder adducted) Right Shoulder Flexion: 155 Degrees (115) Right Shoulder ABduction: 160 Degrees (70) Right Shoulder Internal Rotation: 90 Degrees (90) Right Shoulder External Rotation: 90 Degrees (40) RUE PROM (degrees) RUE Overall PROM Comments: WFL this date in supine RUE Strength Right Shoulder Flexion: 4/5 Right Shoulder ABduction: 4/5 Right Shoulder Internal Rotation: 4/5 Right Shoulder External Rotation: 4/5     Exercise/Treatments Seated Protraction: Strengthening;10  reps Protraction Weight (lbs): 1 Horizontal ABduction: Strengthening;10 reps Horizontal ABduction Weight (lbs): 1 External Rotation: Strengthening;10 reps External Rotation Weight (lbs): 1 Internal Rotation: Strengthening;10 reps Internal Rotation Weight (lbs): 1 Flexion: Strengthening;10 reps Flexion Weight (lbs): 1 Abduction: Strengthening;10 reps ABduction Weight (lbs): 1 Other Seated Exercises: elbow flex ext with 1# 15 times  Standing External Rotation: Theraband;10 reps Theraband Level (Shoulder External Rotation): Level 2 (Red) Internal Rotation: Theraband;10 reps Theraband Level (Shoulder Internal Rotation): Level 2 (Red) Extension: Theraband;10 reps Theraband Level (Shoulder Extension): Level 2 (Red) Row: Theraband;10 reps Theraband Level (Shoulder Row): Level 2 (Red) Retraction: Theraband;10 reps    Manual Therapy Manual Therapy: Myofascial release Myofascial Release: Myofascial release (MFR) and manual stretching to right upper arm, scapular region, shoulder and upper trapezius region to decrease pain and fascial restrictions and improve pain free mobility.   Occupational Therapy Assessment and Plan OT Assessment and Plan Clinical Impression Statement: A:  Patient has met all short term goals.  Her primary deficit is lack of strength in her right arm and shoulder, which we will continue to address over the next 2 weeks of therapy.  Began strengthening exercises in seated this date. OT Frequency: Min 2X/week OT Duration: 4 weeks OT Plan: P:  Focus on proximal shoulder and general arm strengthening in order to be able to lift items into overhead shelves.  Add x to v and w arms, cybex press and row.   Goals Short Term Goals Time to Complete Short Term Goals: 3 weeks Short Term Goal 1: Patient will be educated on a HEP. Short Term Goal 1 Progress: Met Short  Term Goal 2: Patient will improve PROM to [facility] in her right shoulder for increased ability to curl her hair and  remove her blouse without difficulty. Short Term Goal 2 Progress: Met Short Term Goal 3: Patient will improve right shoulder strength to 3+/5 for increased ability to lift bags of grocery out of her car. Short Term Goal 3 Progress: Met Short Term Goal 4: Patient will decrease pain in her right shoulder to2/10 when completing daily activities.  Short Term Goal 4 Progress: Met Short Term Goal 5: Patient will decrease fascial restrictions to min-mod in her right shoulder region.  Short Term Goal 5 Progress: Met Long Term Goals Time to Complete Long Term Goals: 6 weeks Long Term Goal 1: Patient will return to prior level of independence with all B/IADLs and leisure activities. Long Term Goal 1 Progress: Progressing toward goal Long Term Goal 2: Patient will improve AROM to WNL in her right shoulder for increased ability to curl her hair and remove her blouse without difficulty. Long Term Goal 2 Progress: Progressing toward goal Long Term Goal 3: Patient will improve right shoulder strength to 5/5 for increased ability to shift gears in her husbands car. Long Term Goal 3 Progress: Progressing toward goal Long Term Goal 4: Patient will decrease pain in her right shoulder to 2/10 when completing daily activities.  Long Term Goal 4 Progress: Met Long Term Goal 5: Patient will decrease fascial restrictions to min in her right shoulder region.  Long Term Goal 5 Progress: Progressing toward goal  Problem List Patient Active Problem List   Diagnosis Date Noted  . Rapid heart rate 08/24/2013  . Status post subacromial decompression 08/14/2013  . Muscle weakness (generalized) 06/14/2013  . Rotator cuff syndrome of right shoulder 12/05/2012  . Right shoulder pain 12/05/2012  . Rectal bleeding 06/06/2011  . Epigastric pain 04/19/2011  . GERD (gastroesophageal reflux disease) 04/19/2011  . IBS (irritable bowel syndrome) 04/19/2011  . METATARSALGIA 05/12/2009  . BUNION 05/12/2009    OT Plan of  Care OT Home Exercise Plan: green tband for scapular stability OT Patient Instructions: demonstrated, returned demonstration Consulted and Agree with Plan of Care: Patient  GO Functional Assessment Tool Used: FOTO scored 69% Independent Functional Limitation: Carrying, moving and handling objects Carrying, Moving and Handling Objects Current Status ([id number]): At least 20 percent but less than 40 percent impaired, limited or restricted Carrying, Moving and Handling Objects Goal Status [phone]): At least 1 percent but less than 20 percent impaired, limited or restricted  Arbutus Ped 09/11/2013, 2:21 PM  Physician Documentation Your signature is required to indicate approval of the treatment plan as stated above.  Please sign and either send electronically or make a copy of this report for your files and return this physician signed original.  Please mark one 1.__approve of plan  2. ___approve of plan with the following conditions.   ______________________________                                                          _____________________ Physician Signature  Date  

## 2013-09-11 NOTE — Evaluation (Deleted)
Occupational Therapy Evaluation  Patient Details  Name: Erica [name] MRN: [id number] Date of Birth: 1932-06-27  Today's Date: 09/11/2013 Time: 5621-3086 OT Time Calculation (min): 44 min Manual therapy 5784-6962 16' ROM/MMT 9528-4132 10' Therapeutic exercises 1322-1350 28' Visit#: 6 of 12  Re-eval: 09/27/13  Assessment Diagnosis: S/P Right SAD, DCE  Authorization: BCBS Medicare  Authorization Time Period: before 16th visit   Authorization Visit#: 6 of 16   Past Medical History:  Past Medical History  Diagnosis Date  . Irritable bowel syndrome   . Thyroid condition   . Depression   . RA (rheumatoid arthritis)    Past Surgical History:  Past Surgical History  Procedure Laterality Date  . Colostomy    . Abdominal hysterectomy    . Colonoscopy    . Upper gastrointestinal endoscopy    . Eye surgery  cataract x 2  . Colonoscopy N/A 05/08/2012    Procedure: COLONOSCOPY;  Surgeon: Rogene Houston, MD;  Location: [facility];  Service: Endoscopy;  Laterality: N/A;  730  . Bone spurs  08/07/13    Right Shoulder    Subjective S:  I dont have alot of strength in my shoulder or arm.  I can move it pretty good. Just not strength. Special Tests: FOTO 69% independent Pain Assessment Currently in Pain?: No/denies Pain Score: 0-No pain   Additional Assessments RUE AROM (degrees) RUE Overall AROM Comments: assessed in seated ER/IR with shoulder adducted (08/14/13 supine ER/IR with shoulder adducted) Right Shoulder Flexion: 155 Degrees (115) Right Shoulder ABduction: 160 Degrees (70) Right Shoulder Internal Rotation: 90 Degrees (90) Right Shoulder External Rotation: 90 Degrees (40) RUE PROM (degrees) RUE Overall PROM Comments: WFL this date in supine RUE Strength Right Shoulder Flexion: 4/5 Right Shoulder ABduction: 4/5 Right Shoulder Internal Rotation: 4/5 Right Shoulder External Rotation: 4/5     Exercise/Treatments Supine   Seated   Prone    Sidelying    Standing   Pulleys   Therapy Ball   ROM / Strengthening / Isometric Strengthening     Stretches   [employer] Therapy Manual Therapy: Myofascial release Myofascial Release: Myofascial release (MFR) and manual stretching to right upper arm, scapular region, shoulder and upper trapezius region to decrease pain and fascial restrictions and improve pain free mobility.   Occupational Therapy Assessment and Plan OT Assessment and Plan Clinical Impression Statement: A:  Patient has met all short term goals.  Her primary deficit is lack of strength in her right arm and shoulder, which we will continue to address over the next 2 weeks of therapy.  Began strengthening exercises in seated this date. OT Frequency: Min 2X/week OT Duration: 4 weeks OT Plan: P:  Focus on proximal shoulder and general arm strengthening in order to be able to lift items into overhead shelves.  Add x to v and w arms, cybex press and row.   Goals Short Term Goals Time to Complete Short Term Goals: 3 weeks Short Term Goal 1: Patient will be educated on a HEP. Short Term Goal 1 Progress: Met Short Term Goal 2: Patient will improve PROM to [facility] in her right shoulder for increased ability to curl her hair and remove her blouse without difficulty. Short Term Goal 2 Progress: Met Short Term Goal 3: Patient will improve right shoulder strength to 3+/5 for increased ability to lift bags of grocery out of her car. Short  Term Goal 3 Progress: Met Short Term Goal 4: Patient will decrease pain in her right shoulder to2/10 when completing daily activities.  Short Term Goal 4 Progress: Met Short Term Goal 5: Patient will decrease fascial restrictions to min-mod in her right shoulder region.  Short Term Goal 5 Progress: Met Long Term Goals Time to Complete Long Term Goals: 6 weeks Long Term Goal 1: Patient will return to prior level of independence with all B/IADLs and leisure  activities. Long Term Goal 1 Progress: Progressing toward goal Long Term Goal 2: Patient will improve AROM to WNL in her right shoulder for increased ability to curl her hair and remove her blouse without difficulty. Long Term Goal 2 Progress: Progressing toward goal Long Term Goal 3: Patient will improve right shoulder strength to 5/5 for increased ability to shift gears in her husbands car. Long Term Goal 3 Progress: Progressing toward goal Long Term Goal 4: Patient will decrease pain in her right shoulder to 2/10 when completing daily activities.  Long Term Goal 4 Progress: Met Long Term Goal 5: Patient will decrease fascial restrictions to min in her right shoulder region.  Long Term Goal 5 Progress: Progressing toward goal  Problem List Patient Active Problem List   Diagnosis Date Noted  . Rapid heart rate 08/24/2013  . Status post subacromial decompression 08/14/2013  . Muscle weakness (generalized) 06/14/2013  . Rotator cuff syndrome of right shoulder 12/05/2012  . Right shoulder pain 12/05/2012  . Rectal bleeding 06/06/2011  . Epigastric pain 04/19/2011  . GERD (gastroesophageal reflux disease) 04/19/2011  . IBS (irritable bowel syndrome) 04/19/2011  . METATARSALGIA 05/12/2009  . BUNION 05/12/2009    OT Plan of Care OT Home Exercise Plan: green tband for scapular stability OT Patient Instructions: demonstrated, returned demonstration Consulted and Agree with Plan of Care: Patient  GO Functional Assessment Tool Used: FOTO scored 69% Independent Functional Limitation: Carrying, moving and handling objects Carrying, Moving and Handling Objects Current Status (J2426): At least 20 percent but less than 40 percent impaired, limited or restricted Carrying, Moving and Handling Objects Goal Status [phone]): At least 1 percent but less than 20 percent impaired, limited or restricted  Arbutus Ped 09/11/2013, 2:17 PM  Physician Documentation Your signature is required to  indicate approval of the treatment plan as stated above.  Please sign and either send electronically or make a copy of this report for your files and return this physician signed original.  Please mark one 1.__approve of plan  2. ___approve of plan with the following conditions.   ______________________________                                                          _____________________ Physician Signature  Date  

## 2013-09-16 ENCOUNTER — Ambulatory Visit (HOSPITAL_COMMUNITY)
Admission: RE | Admit: 2013-09-16 | Discharge: 2013-09-16 | Disposition: A | Discharge status: Home / Self Care | Payer: Medicare Other | Source: Ambulatory Visit | Attending: Internal Medicine | Admitting: Internal Medicine

## 2013-09-16 DIAGNOSIS — IMO0001 Reserved for inherently not codable concepts without codable children: Secondary | ICD-10-CM | POA: Diagnosis not present

## 2013-09-16 NOTE — Progress Notes (Signed)
Occupational Therapy Treatment Patient Details  Name: Erica [name] MRN: [id number] Date of Birth: 1932/09/09  Today's Date: 09/16/2013 Time: 1062-6948 OT Time Calculation (min): 44 min Manual 5462-7035 (15') Therapeutic Exercises 1323-1352 (28')  Visit#: 7 of 12  Re-eval: 09/27/13    Authorization: BCBS Medicare  Authorization Time Period: before 16th visit   Authorization Visit#: 7 of 16  Subjective Symptoms/Limitations Symptoms: "What I'm having problems with is lifting things and turning, but not actual pain." Pain Assessment Currently in Pain?: No/denies  Precautions/Restrictions     Exercise/Treatments Supine Protraction: PROM;5 reps;Strengthening;10 reps;Weights Protraction Weight (lbs): 1 Horizontal ABduction: PROM;5 reps;Strengthening;12 reps;Weights Horizontal ABduction Weight (lbs): 1 External Rotation: PROM;5 reps;Strengthening;12 reps;Weights External Rotation Weight (lbs): 1 Internal Rotation: PROM;5 reps;Strengthening;12 reps;Weights Internal Rotation Weight (lbs): 1 Flexion: PROM;5 reps;Strengthening;12 reps;Weights Shoulder Flexion Weight (lbs): 1 ABduction: PROM;5 reps;Strengthening;12 reps;Weights Shoulder ABduction Weight (lbs): 1 Seated Protraction: Strengthening;10 reps Protraction Weight (lbs): 1 Horizontal ABduction: Strengthening;10 reps Horizontal ABduction Weight (lbs): 1 External Rotation: Strengthening;10 reps External Rotation Weight (lbs): 1 Internal Rotation: Strengthening;10 reps Internal Rotation Weight (lbs): 1 Flexion: Strengthening;10 reps Flexion Weight (lbs): 1 Abduction: Strengthening;10 reps ABduction Weight (lbs): 1 ROM / Strengthening / Isometric Strengthening Cybex Press: 1 plate;10 reps Cybex Row: 1 plate;10 reps "W" Arms: 10 reps with 1# X to V Arms: 10 reps with 1# Proximal Shoulder Strengthening, Supine: 10 times each with no rests with 1# weight Proximal Shoulder Strengthening, Seated: 10 reps each with 1#  and no breaks   Manual Therapy Manual Therapy: Myofascial release Myofascial Release: Myofascial release (MFR) and manual stretching to right upper arm, scapular region, shoulder and upper trapezius region to decrease pain and fascial restrictions and improve pain free mobility.    Occupational Therapy Assessment and Plan OT Assessment and Plan Clinical Impression Statement: Pt verbalizes increased difficulty dealing with her depression this session - pt was placed on new medication after surgery and pt verbalizes that it is not working. Pt to complete 1 more week of current medication prior MDs trying new one.   had good tolerance of all exercises using 1# weight.  Added x-v and w arms, and cybex press and row.  Pt had good tolerance of all. OT Plan: P:  Focus on proximal shoulder and general arm strengthening in order to be able to lift items into overhead shelves.  Add ball on wall.   Goals Short Term Goals Short Term Goal 1: Patient will be educated on a HEP. Short Term Goal 1 Progress: Met Short Term Goal 2: Patient will improve PROM to [facility] in her right shoulder for increased ability to curl her hair and remove her blouse without difficulty. Short Term Goal 2 Progress: Met Short Term Goal 3: Patient will improve right shoulder strength to 3+/5 for increased ability to lift bags of grocery out of her car. Short Term Goal 3 Progress: Met Short Term Goal 4: Patient will decrease pain in her right shoulder to2/10 when completing daily activities.  Short Term Goal 4 Progress: Met Short Term Goal 5: Patient will decrease fascial restrictions to min-mod in her right shoulder region.  Short Term Goal 5 Progress: Met Long Term Goals Long Term Goal 1: Patient will return to prior level of independence with all B/IADLs and leisure activities. Long Term Goal 1 Progress: Progressing toward goal Long Term Goal 2: Patient will improve AROM to WNL in her right shoulder for increased ability to curl  her hair and remove her blouse without difficulty. Long Term Goal  2 Progress: Progressing toward goal Long Term Goal 3: Patient will improve right shoulder strength to 5/5 for increased ability to shift gears in her husbands car. Long Term Goal 3 Progress: Progressing toward goal Long Term Goal 4: Patient will decrease pain in her right shoulder to 2/10 when completing daily activities.  Long Term Goal 4 Progress: Met Long Term Goal 5: Patient will decrease fascial restrictions to min in her right shoulder region.  Long Term Goal 5 Progress: Progressing toward goal  Problem List Patient Active Problem List   Diagnosis Date Noted  . Rapid heart rate 08/24/2013  . Status post subacromial decompression 08/14/2013  . Muscle weakness (generalized) 06/14/2013  . Rotator cuff syndrome of right shoulder 12/05/2012  . Right shoulder pain 12/05/2012  . Rectal bleeding 06/06/2011  . Epigastric pain 04/19/2011  . GERD (gastroesophageal reflux disease) 04/19/2011  . IBS (irritable bowel syndrome) 04/19/2011  . METATARSALGIA 05/12/2009  . BUNION 05/12/2009    End of Session Activity Tolerance: Patient tolerated treatment well General Behavior During Therapy: WFL for tasks assessed/performed OT Plan of Care OT Home Exercise Plan: Standing weighted AROM - abduction, extension, ER/IR, flexion, retraction, protraction, horizontal abduction OT Patient Instructions: demonstrated, returned demonstration. Handout provided. Consulted and Agree with Plan of Care: Patient  [place], [place], OTR/L [phone]  09/16/2013, 3:46 PM

## 2013-09-18 ENCOUNTER — Ambulatory Visit (HOSPITAL_COMMUNITY): Payer: Medicare Other

## 2013-09-18 ENCOUNTER — Telehealth (HOSPITAL_COMMUNITY): Payer: Self-pay

## 2013-09-19 ENCOUNTER — Ambulatory Visit (INDEPENDENT_AMBULATORY_CARE_PROVIDER_SITE_OTHER): Payer: Medicare Other | Admitting: Internal Medicine

## 2013-09-19 ENCOUNTER — Encounter (INDEPENDENT_AMBULATORY_CARE_PROVIDER_SITE_OTHER): Payer: Self-pay | Admitting: Internal Medicine

## 2013-09-19 VITALS — BP 126/84 | HR 128 | Temp 98.4°F | Resp 18 | Ht 63.0 in | Wt 121.5 lb

## 2013-09-19 DIAGNOSIS — K589 Irritable bowel syndrome without diarrhea: Secondary | ICD-10-CM

## 2013-09-19 DIAGNOSIS — K219 Gastro-esophageal reflux disease without esophagitis: Secondary | ICD-10-CM

## 2013-09-19 NOTE — Patient Instructions (Addendum)
Call if swallowing difficulty becomes more frequent.

## 2013-09-19 NOTE — Progress Notes (Signed)
Presenting complaint;  Followup for nausea diarrhea and weight loss.  Subjective:  Patient is 80 year old Caucasian female who is here for scheduled visit accompanied by her daughter-in-law Angelique Blonder. She was last seen 4 weeks ago. She is having 2 formed stools daily. She has not had urgency or accidents. She also has not felt constipated. She denies abdominal pain. She remains with poor appetite but force-feed herself. She has nausea usually in the morning. She also has dysphagia she seemed to get better as the day progresses. She states her heart rate has been up and down. She is not having lightheadedness or dizziness. She states she was begun on Wellbutrin on 09/10/2013. She felt great the next day but then she developed nervousness and stop medication after 4 doses. She has an appointment to see Dr. Nolen Mu next week. She has been in Remeron for 39 days and believes his not working. She is having to take lorazepam every morning to calm herself down. She does not like to take this medication because it makes her sluggish. She remains concerned about her husband who is having episodes of fecal incontinence. She is back on methotrexate but she takes an injection every month. She does not know the dose.   Current Medications: Outpatient Encounter Prescriptions as of 09/19/2013  Medication Sig  . Cyanocobalamin (VITAMIN B-12) 1000 MCG SUBL Place under the tongue daily.  . famotidine (PEPCID) 20 MG tablet Take 1 tablet (20 mg total) by mouth 2 (two) times daily.  . folic acid (FOLVITE) 1 MG tablet Take 1 mg by mouth at bedtime.   Marland Kitchen levothyroxine (SYNTHROID) 75 MCG tablet Take 75 mcg by mouth daily.  Marland Kitchen loperamide (IMODIUM A-D) 2 MG tablet Take 0.5 tablets (1 mg total) by mouth 2 (two) times daily.  Marland Kitchen LORazepam (ATIVAN) 0.5 MG tablet Take 0.5 mg by mouth every morning.   . Methotrexate, Anti-Rheumatic, (METHOTREXATE, PF, [place]) Inject into the skin once a week.   . mirtazapine (REMERON) 30 MG tablet  Take 30 mg by mouth at bedtime.  . bifidobacterium infantis (ALIGN) capsule Take 1 capsule by mouth daily.  . ondansetron (ZOFRAN) 4 MG tablet Take 1 tablet (4 mg total) by mouth 2 (two) times daily as needed for nausea or vomiting.  . [DISCONTINUED] rifaximin (XIFAXAN) 550 MG TABS tablet Take 1 tablet (550 mg total) by mouth 2 (two) times daily.    Objective: Blood pressure 126/84, pulse 128, temperature 98.4 F (36.9 C), temperature source Oral, resp. rate 18, height 5\' 3"  (1.6 m), weight 121 lb 8 oz (55.112 kg). Patient is alert and in no acute distress. Conjunctiva is pink. Sclera is nonicteric Oropharyngeal mucosa is normal. No neck masses or thyromegaly noted. Abdomen is full. Bowel sounds are normal. On palpation abdomen is soft and nontender without organomegaly or masses.  No LE edema or clubbing noted.    Assessment:  #1. Irritable bowel syndrome. She is doing much better with therapy. She is at risk for relapse of symptoms because her depression is not controlled and also because of her husband's illness. #2. GERD. She is doing well with therapy. She is having sporadic dysphagia possibly due to esophageal spasm. If this symptom worsens or becomes more frequent she will need esophageal dilation. #3. Anorexia most likely secondary to depression. Her weight is down only about 1 pound since her last visit.    Plan:  Patient will continue loperamide and Pepcid as before. She can use Colace or Listerine/Dulcolax suppository for constipation he should not  take laxative by mouth. Patient will call if dysphagia worsens in which case will proceed with EGD with dilation. Office visit in 8 weeks.

## 2013-09-20 ENCOUNTER — Ambulatory Visit (HOSPITAL_COMMUNITY)
Admission: RE | Admit: 2013-09-20 | Discharge: 2013-09-20 | Disposition: A | Discharge status: Home / Self Care | Payer: Medicare Other | Source: Ambulatory Visit | Attending: Orthopedic Surgery | Admitting: Orthopedic Surgery

## 2013-09-20 DIAGNOSIS — Z9889 Other specified postprocedural states: Secondary | ICD-10-CM

## 2013-09-20 DIAGNOSIS — IMO0001 Reserved for inherently not codable concepts without codable children: Secondary | ICD-10-CM | POA: Diagnosis not present

## 2013-09-20 NOTE — Progress Notes (Signed)
Occupational Therapy Treatment Patient Details  Name: Erica [name] MRN: [id number] Date of Birth: 1933/03/09  Today's Date: 09/20/2013 Time: 1302-1400 OT Time Calculation (min): 58 min Manual therapy 7564-3329 24'  therapeutic exercises 1326-1400 34' Visit#: 8 of 12  Re-eval: 09/27/13    Authorization: BCBS Medicare  Authorization Time Period: before 16th visit   Authorization Visit#: 8 of 16  Subjective S:  My depression is really getting to me, I go Tuesday to the MD, hopefully that will help.  I saw Dr. Sherlean Foot this week, he is very pleased. Limitations: progress as tolerated Pain Assessment Currently in Pain?: No/denies Pain Score: 0-No pain  Precautions/Restrictions   progress as tolerated  Exercise/Treatments Supine Protraction: PROM;5 reps;Strengthening;15 reps Protraction Weight (lbs): 1 Horizontal ABduction: PROM;5 reps;Strengthening;15 reps Horizontal ABduction Weight (lbs): 1 External Rotation: PROM;5 reps;Strengthening;15 reps External Rotation Weight (lbs): 1 Internal Rotation: PROM;5 reps;Strengthening;15 reps Internal Rotation Weight (lbs): 1 Flexion: PROM;5 reps;Strengthening;15 reps Shoulder Flexion Weight (lbs): 1 ABduction: PROM;5 reps;Strengthening;15 reps Shoulder ABduction Weight (lbs): 1 Seated Protraction: Strengthening;15 reps Protraction Weight (lbs): 1 Horizontal ABduction: Strengthening;15 reps Horizontal ABduction Weight (lbs): 1 External Rotation: Strengthening;15 reps External Rotation Weight (lbs): 1 Internal Rotation: Strengthening;15 reps Internal Rotation Weight (lbs): 1 Flexion: Strengthening;15 reps Flexion Weight (lbs): 1 Abduction: Strengthening;15 reps ABduction Weight (lbs): 1 Standing External Rotation: Theraband;15 reps Theraband Level (Shoulder External Rotation): Level 3 (Green) Internal Rotation: Theraband;15 reps Theraband Level (Shoulder Internal Rotation): Level 3 (Green) Extension: Theraband;15  reps Theraband Level (Shoulder Extension): Level 3 (Green) Row: Theraband;15 reps Theraband Level (Shoulder Row): Level 3 (Green) Retraction: Theraband;15 reps Theraband Level (Shoulder Retraction): Level 3 (Green) ROM / Strengthening / Isometric Strengthening UBE (Upper Arm Bike): 3' forward and 3' reverse at1.0 intensity Proximal Shoulder Strengthening, Supine: 10 times each with no rests with 1# weight Proximal Shoulder Strengthening, Seated: 10 reps each with 1# and no breaks       Manual Therapy Manual Therapy: Myofascial release Myofascial Release: Myofascial release (MFR) and manual stretching to right upper arm, scapular region, shoulder and upper trapezius region to decrease pain and fascial restrictions and improve pain free mobility.   Occupational Therapy Assessment and Plan OT Assessment and Plan Clinical Impression Statement: A:  Added proximal shoulder strengthening with UBE and increased to mod resist green theraband for proximal shoulder strengthening exercises. OT Plan: P:  Add ball on the wall.   Goals Short Term Goals Short Term Goal 1: Patient will be educated on a HEP. Short Term Goal 2: Patient will improve PROM to [facility] in her right shoulder for increased ability to curl her hair and remove her blouse without difficulty. Short Term Goal 3: Patient will improve right shoulder strength to 3+/5 for increased ability to lift bags of grocery out of her car. Short Term Goal 4: Patient will decrease pain in her right shoulder to2/10 when completing daily activities.  Short Term Goal 5: Patient will decrease fascial restrictions to min-mod in her right shoulder region.  Long Term Goals Long Term Goal 1: Patient will return to prior level of independence with all B/IADLs and leisure activities. Long Term Goal 2: Patient will improve AROM to WNL in her right shoulder for increased ability to curl her hair and remove her blouse without difficulty. Long Term Goal 3: Patient  will improve right shoulder strength to 5/5 for increased ability to shift gears in her husbands car. Long Term Goal 4: Patient will decrease pain in her right shoulder to 2/10 when completing daily activities.  Long Term Goal 5: Patient will decrease fascial restrictions to min in her right shoulder region.   Problem List Patient Active Problem List   Diagnosis Date Noted  . Rapid heart rate 08/24/2013  . Status post subacromial decompression 08/14/2013  . Muscle weakness (generalized) 06/14/2013  . Rotator cuff syndrome of right shoulder 12/05/2012  . Right shoulder pain 12/05/2012  . Rectal bleeding 06/06/2011  . Epigastric pain 04/19/2011  . GERD (gastroesophageal reflux disease) 04/19/2011  . IBS (irritable bowel syndrome) 04/19/2011  . METATARSALGIA 05/12/2009  . BUNION 05/12/2009       GO    Shirlean Mylar, OTR/L [phone]  09/20/2013, 2:00 PM

## 2013-09-23 ENCOUNTER — Ambulatory Visit (HOSPITAL_COMMUNITY): Payer: Medicare Other

## 2013-09-25 ENCOUNTER — Ambulatory Visit (HOSPITAL_COMMUNITY): Payer: Medicare Other

## 2013-09-26 ENCOUNTER — Ambulatory Visit (HOSPITAL_COMMUNITY): Admission: RE | Admit: 2013-09-26 | Payer: Medicare Other | Source: Ambulatory Visit

## 2013-09-27 ENCOUNTER — Inpatient Hospital Stay (HOSPITAL_COMMUNITY)
Admission: RE | Admit: 2013-09-27 | Discharge: 2013-09-27 | Disposition: A | Discharge status: Home / Self Care | Payer: Medicare Other | Source: Ambulatory Visit

## 2013-09-27 ENCOUNTER — Telehealth (HOSPITAL_COMMUNITY): Payer: Self-pay

## 2013-09-30 ENCOUNTER — Ambulatory Visit (HOSPITAL_COMMUNITY)
Admission: RE | Admit: 2013-09-30 | Discharge: 2013-09-30 | Disposition: A | Discharge status: Home / Self Care | Payer: Medicare Other | Source: Ambulatory Visit | Attending: Internal Medicine | Admitting: Internal Medicine

## 2013-09-30 DIAGNOSIS — IMO0001 Reserved for inherently not codable concepts without codable children: Secondary | ICD-10-CM | POA: Diagnosis not present

## 2013-09-30 DIAGNOSIS — Z9889 Other specified postprocedural states: Secondary | ICD-10-CM

## 2013-09-30 NOTE — Progress Notes (Signed)
Occupational Therapy Treatment Patient Details  Name: SAVANNAH MORFORD MRN: [id number] Date of Birth: 1932-03-30  Today's Date: 09/30/2013 Time: 7425-9563 OT Time Calculation (min): 41 min MFR 8756-4332 19' Therex 9518-8416  22'  Visit#: 9 of 12  Re-eval: 10/09/13    Authorization: BCBS Medicare  Authorization Time Period: before 16th visit   Authorization Visit#: 9 of 16  Subjective Symptoms/Limitations Symptoms: S: My shoulder is really feeling good today.  Pain Assessment Currently in Pain?: No/denies  Precautions/Restrictions  Precautions Precautions: None  Exercise/Treatments Supine Protraction: PROM;5 reps;Strengthening;15 reps Protraction Weight (lbs): 1 Horizontal ABduction: PROM;5 reps;Strengthening;15 reps Horizontal ABduction Weight (lbs): 1 External Rotation: PROM;5 reps;Strengthening;15 reps External Rotation Weight (lbs): 1 Internal Rotation: PROM;5 reps;Strengthening;15 reps Internal Rotation Weight (lbs): 1 Flexion: PROM;5 reps;Strengthening;15 reps Shoulder Flexion Weight (lbs): 1 ABduction: PROM;5 reps;Strengthening;15 reps Shoulder ABduction Weight (lbs): 1 Seated Protraction: Strengthening;15 reps Protraction Weight (lbs): 1 Horizontal ABduction: Strengthening;15 reps Horizontal ABduction Weight (lbs): 1 External Rotation: Strengthening;15 reps External Rotation Weight (lbs): 1 Internal Rotation: Strengthening;15 reps Internal Rotation Weight (lbs): 1 Flexion: Strengthening;15 reps Flexion Weight (lbs): 1 Abduction: Strengthening;15 reps ABduction Weight (lbs): 1 ROM / Strengthening / Isometric Strengthening UBE (Upper Arm Bike): 3' forward and 3' reverse at1.0 intensity Cybex Press: 1 plate;15 reps Cybex Row: 1 plate;15 reps "W" Arms: 12X with 1# X to V Arms: 12X with 1# Proximal Shoulder Strengthening, Supine: 12X with 1# Proximal Shoulder Strengthening, Seated: 12X with 1#      Manual Therapy Manual Therapy: Myofascial  release Myofascial Release: Myofascial release (MFR) and manual stretching to right upper arm, scapular region, shoulder and upper trapezius region to decrease pain and fascial restrictions and improve pain free mobility.   Occupational Therapy Assessment and Plan OT Assessment and Plan Clinical Impression Statement: A: Added Ball on the wall. patient tolerated well with some fatigue.  OT Plan: P: Cont to work on increasing shoulder and scapular stability.   Goals Short Term Goals Time to Complete Short Term Goals: 3 weeks Short Term Goal 1: Patient will be educated on a HEP. Short Term Goal 2: Patient will improve PROM to [facility] in her right shoulder for increased ability to curl her hair and remove her blouse without difficulty. Short Term Goal 3: Patient will improve right shoulder strength to 3+/5 for increased ability to lift bags of grocery out of her car. Short Term Goal 4: Patient will decrease pain in her right shoulder to2/10 when completing daily activities.  Short Term Goal 5: Patient will decrease fascial restrictions to min-mod in her right shoulder region.  Long Term Goals Time to Complete Long Term Goals: 6 weeks Long Term Goal 1: Patient will return to prior level of independence with all B/IADLs and leisure activities. Long Term Goal 1 Progress: Progressing toward goal Long Term Goal 2: Patient will improve AROM to WNL in her right shoulder for increased ability to curl her hair and remove her blouse without difficulty. Long Term Goal 2 Progress: Progressing toward goal Long Term Goal 3: Patient will improve right shoulder strength to 5/5 for increased ability to shift gears in her husbands car. Long Term Goal 3 Progress: Progressing toward goal Long Term Goal 4: Patient will decrease pain in her right shoulder to 2/10 when completing daily activities.  Long Term Goal 5: Patient will decrease fascial restrictions to min in her right shoulder region.  Long Term Goal 5  Progress: Progressing toward goal  Problem List Patient Active Problem List   Diagnosis Date Noted  .  Rapid heart rate 08/24/2013  . Status post subacromial decompression 08/14/2013  . Muscle weakness (generalized) 06/14/2013  . Rotator cuff syndrome of right shoulder 12/05/2012  . Right shoulder pain 12/05/2012  . Rectal bleeding 06/06/2011  . Epigastric pain 04/19/2011  . GERD (gastroesophageal reflux disease) 04/19/2011  . IBS (irritable bowel syndrome) 04/19/2011  . METATARSALGIA 05/12/2009  . BUNION 05/12/2009    End of Session Activity Tolerance: Patient tolerated treatment well General Behavior During Therapy: [facility] for tasks assessed/performed   Limmie Patricia, OTR/L,CBIS   09/30/2013, 1:39 PM

## 2013-10-03 ENCOUNTER — Ambulatory Visit (HOSPITAL_COMMUNITY): Payer: Medicare Other

## 2013-10-04 ENCOUNTER — Ambulatory Visit (HOSPITAL_COMMUNITY)
Admission: RE | Admit: 2013-10-04 | Discharge: 2013-10-04 | Disposition: A | Discharge status: Home / Self Care | Payer: Medicare Other | Source: Ambulatory Visit | Attending: Orthopedic Surgery | Admitting: Orthopedic Surgery

## 2013-10-04 DIAGNOSIS — IMO0001 Reserved for inherently not codable concepts without codable children: Secondary | ICD-10-CM | POA: Diagnosis not present

## 2013-10-04 NOTE — Progress Notes (Signed)
Occupational Therapy Treatment Patient Details  Name: Erica [name] MRN: [id number] Date of Birth: Jul 13, 1932  Today's Date: 10/04/2013 Time: 5724-2424 OT Time Calculation (min): 42 min Manual 1428-1440 (12') Therapeutic Exercises 1440-1510 (30')  Visit#: 10 of 12  Re-eval: 10/09/13    Authorization: BCBS Medicare  Authorization Time Period: before 16th visit   Authorization Visit#: 10 of 16  Subjective Symptoms/Limitations Symptoms: "I did a lot on Monday - i got more than i bargained for in that workout." Pain Assessment Currently in Pain?: No/denies  Precautions/Restrictions     Exercise/Treatments Supine Protraction: PROM;5 reps;Strengthening;15 reps Protraction Weight (lbs): 1 Horizontal ABduction: PROM;5 reps;Strengthening;15 reps Horizontal ABduction Weight (lbs): 1 External Rotation: PROM;5 reps;Strengthening;15 reps External Rotation Weight (lbs): 1 Internal Rotation: PROM;5 reps;Strengthening;15 reps Internal Rotation Weight (lbs): 1 Flexion: PROM;5 reps;Strengthening;15 reps Shoulder Flexion Weight (lbs): 1 ABduction: PROM;5 reps;Strengthening;15 reps Shoulder ABduction Weight (lbs): 1 Seated Protraction: Strengthening;15 reps Protraction Weight (lbs): 1 Horizontal ABduction: Strengthening;15 reps Horizontal ABduction Weight (lbs): 1 External Rotation: Strengthening;15 reps External Rotation Weight (lbs): 1 Internal Rotation: Strengthening;15 reps Internal Rotation Weight (lbs): 1 Flexion: Strengthening;15 reps Flexion Weight (lbs): 1 Abduction: Strengthening;15 reps ABduction Weight (lbs): 1 ROM / Strengthening / Isometric Strengthening "W" Arms: 15x with 1# weights X to V Arms: 15x with 1# weights Proximal Shoulder Strengthening, Supine: 15x with 1# Proximal Shoulder Strengthening, Seated: 15x with 1# weights Ball on Wall: 1' each in flexion and abduction with weighted green ball   Manual Therapy Manual Therapy: Myofascial  release Myofascial Release: Myofascial release (MFR) and manual stretching to right upper arm, scapular region, shoulder and upper trapezius region to decrease pain and fascial restrictions and improve pain free mobility.   Occupational Therapy Assessment and Plan OT Assessment and Plan Clinical Impression Statement: Pt verbalizes ability to pick up milk carton at home using RUE now.  Tolerated well all exericses. Increased reps with x-v, w, and proximal shoulder strengthening.  Added ball on wall.  Pt verbalized less fatigue this session than in previous session. OT Plan: Increase to 2# weights. Re-Eval 7/29.   Goals Short Term Goals Short Term Goal 1: Patient will be educated on a HEP. Short Term Goal 1 Progress: Met Short Term Goal 2: Patient will improve PROM to [facility] in her right shoulder for increased ability to curl her hair and remove her blouse without difficulty. Short Term Goal 2 Progress: Met Short Term Goal 3: Patient will improve right shoulder strength to 3+/5 for increased ability to lift bags of grocery out of her car. Short Term Goal 3 Progress: Met Short Term Goal 4: Patient will decrease pain in her right shoulder to2/10 when completing daily activities.  Short Term Goal 4 Progress: Met Short Term Goal 5: Patient will decrease fascial restrictions to min-mod in her right shoulder region.  Short Term Goal 5 Progress: Met Long Term Goals Long Term Goal 1: Patient will return to prior level of independence with all B/IADLs and leisure activities. Long Term Goal 1 Progress: Progressing toward goal Long Term Goal 2: Patient will improve AROM to WNL in her right shoulder for increased ability to curl her hair and remove her blouse without difficulty. Long Term Goal 2 Progress: Progressing toward goal Long Term Goal 3: Patient will improve right shoulder strength to 5/5 for increased ability to shift gears in her husbands car. Long Term Goal 3 Progress: Progressing toward  goal Long Term Goal 4: Patient will decrease pain in her right shoulder to 2/10 when completing daily  activities.  Long Term Goal 4 Progress: Met Long Term Goal 5: Patient will decrease fascial restrictions to min in her right shoulder region.  Long Term Goal 5 Progress: Progressing toward goal  Problem List Patient Active Problem List   Diagnosis Date Noted  . Rapid heart rate 08/24/2013  . Status post subacromial decompression 08/14/2013  . Muscle weakness (generalized) 06/14/2013  . Rotator cuff syndrome of right shoulder 12/05/2012  . Right shoulder pain 12/05/2012  . Rectal bleeding 06/06/2011  . Epigastric pain 04/19/2011  . GERD (gastroesophageal reflux disease) 04/19/2011  . IBS (irritable bowel syndrome) 04/19/2011  . METATARSALGIA 05/12/2009  . BUNION 05/12/2009    End of Session Activity Tolerance: Patient tolerated treatment well General Behavior During Therapy: [facility] for tasks assessed/performed  GO    Bea Graff, MS, OTR/L [phone]  10/04/2013, 3:18 PM

## 2013-10-07 ENCOUNTER — Ambulatory Visit (HOSPITAL_COMMUNITY)
Admission: RE | Admit: 2013-10-07 | Discharge: 2013-10-07 | Disposition: A | Discharge status: Home / Self Care | Payer: Medicare Other | Source: Ambulatory Visit

## 2013-10-07 DIAGNOSIS — Z9889 Other specified postprocedural states: Secondary | ICD-10-CM

## 2013-10-07 DIAGNOSIS — IMO0001 Reserved for inherently not codable concepts without codable children: Secondary | ICD-10-CM | POA: Diagnosis not present

## 2013-10-07 NOTE — Progress Notes (Signed)
Occupational Therapy Treatment Patient Details  Name: Erica [name] MRN: [id number] Date of Birth: 28-Dec-1932  Today's Date: 10/07/2013 Time: 9678-9381 OT Time Calculation (min): 33 min MFR 0175-1025 9' Therex 8527-7824  24'  Visit#: 11 of 12  Re-eval: 10/09/13    Authorization: BCBS Medicare  Authorization Time Period: before 16th visit   Authorization Visit#: 11 of 16  Subjective Symptoms/Limitations Symptoms: S: My arm is feeling good today.  Pain Assessment Currently in Pain?: No/denies  Precautions/Restrictions  Precautions Precautions: None  Exercise/Treatments Supine Protraction: PROM;5 reps;Strengthening;10 reps Protraction Weight (lbs): 2 Horizontal ABduction: PROM;5 reps;Strengthening;10 reps Horizontal ABduction Weight (lbs): 2 External Rotation: PROM;5 reps;Strengthening;10 reps External Rotation Weight (lbs): 2 Internal Rotation: PROM;5 reps;Strengthening;10 reps Internal Rotation Weight (lbs): 2 Flexion: PROM;5 reps;Strengthening;10 reps Shoulder Flexion Weight (lbs): 2 ABduction: PROM;5 reps;Strengthening;10 reps Shoulder ABduction Weight (lbs): 2 Seated Protraction: Strengthening;10 reps Protraction Weight (lbs): 2 Horizontal ABduction: Strengthening;10 reps Horizontal ABduction Weight (lbs): 2 External Rotation: Strengthening;10 reps External Rotation Weight (lbs): 2 Internal Rotation: Strengthening;10 reps Internal Rotation Weight (lbs): 2 Flexion: Strengthening;10 reps Flexion Weight (lbs): 2 Abduction: Strengthening;10 reps ABduction Weight (lbs): 2 ROM / Strengthening / Isometric Strengthening Cybex Press: 1 plate;15 reps Cybex Row: 1 plate;15 reps "W" Arms: 10X with 2# X to V Arms: 10X with 2# Proximal Shoulder Strengthening, Supine: 10X with 2# Proximal Shoulder Strengthening, Seated: 10X with 2# Ball on Wall: 1' each in flexion and abduction with weighted green ball       Manual Therapy Manual Therapy: Myofascial  release Myofascial Release: Myofascial release (MFR) and manual stretching to right upper arm, scapular region, shoulder and upper trapezius region to decrease pain and fascial restrictions and improve pain free mobility.  Occupational Therapy Assessment and Plan OT Assessment and Plan Clinical Impression Statement: A: Added 2# to supine and seated exercises. Patient tolerated well.  OT Plan: P: Reassess   Goals Short Term Goals Time to Complete Short Term Goals: 3 weeks Short Term Goal 1: Patient will be educated on a HEP. Short Term Goal 2: Patient will improve PROM to [facility] in her right shoulder for increased ability to curl her hair and remove her blouse without difficulty. Short Term Goal 3: Patient will improve right shoulder strength to 3+/5 for increased ability to lift bags of grocery out of her car. Short Term Goal 4: Patient will decrease pain in her right shoulder to2/10 when completing daily activities.  Short Term Goal 5: Patient will decrease fascial restrictions to min-mod in her right shoulder region.  Long Term Goals Time to Complete Long Term Goals: 6 weeks Long Term Goal 1: Patient will return to prior level of independence with all B/IADLs and leisure activities. Long Term Goal 1 Progress: Progressing toward goal Long Term Goal 2: Patient will improve AROM to WNL in her right shoulder for increased ability to curl her hair and remove her blouse without difficulty. Long Term Goal 2 Progress: Progressing toward goal Long Term Goal 3: Patient will improve right shoulder strength to 5/5 for increased ability to shift gears in her husbands car. Long Term Goal 3 Progress: Progressing toward goal Long Term Goal 4: Patient will decrease pain in her right shoulder to 2/10 when completing daily activities.  Long Term Goal 5: Patient will decrease fascial restrictions to min in her right shoulder region.  Long Term Goal 5 Progress: Progressing toward goal  Problem List Patient  Active Problem List   Diagnosis Date Noted  . Rapid heart rate 08/24/2013  .  Status post subacromial decompression 08/14/2013  . Muscle weakness (generalized) 06/14/2013  . Rotator cuff syndrome of right shoulder 12/05/2012  . Right shoulder pain 12/05/2012  . Rectal bleeding 06/06/2011  . Epigastric pain 04/19/2011  . GERD (gastroesophageal reflux disease) 04/19/2011  . IBS (irritable bowel syndrome) 04/19/2011  . METATARSALGIA 05/12/2009  . BUNION 05/12/2009    End of Session Activity Tolerance: Patient tolerated treatment well General Behavior During Therapy: [facility] for tasks assessed/performed   Limmie Patricia, OTR/L,CBIS   10/07/2013, 2:05 PM

## 2013-10-09 ENCOUNTER — Ambulatory Visit (HOSPITAL_COMMUNITY)
Admission: RE | Admit: 2013-10-09 | Discharge: 2013-10-09 | Disposition: A | Discharge status: Home / Self Care | Payer: Medicare Other | Source: Ambulatory Visit | Attending: Orthopedic Surgery | Admitting: Orthopedic Surgery

## 2013-10-09 DIAGNOSIS — IMO0001 Reserved for inherently not codable concepts without codable children: Secondary | ICD-10-CM | POA: Diagnosis not present

## 2013-10-09 DIAGNOSIS — Z9889 Other specified postprocedural states: Secondary | ICD-10-CM

## 2013-10-09 NOTE — Evaluation (Signed)
Occupational Therapy Reassessment and Discharge  Patient Details  Name: LANAIYA LANTRY MRN: [id number] Date of Birth: [birth year]  Today's Date: 10/09/2013 Time: 7253-6644 OT Time Calculation (min): 24 min MFR 0347-4259 8' Reassess 1314-1331 16'  Visit#: 12 of 12  Re-eval: 10/09/13  Assessment Diagnosis: S/P Right SAD, DCE  Authorization: BCBS Medicare  Authorization Time Period: before 16th visit   Authorization Visit#: 12 of 16   Past Medical History:  Past Medical History  Diagnosis Date  . Irritable bowel syndrome   . Thyroid condition   . Depression   . RA (rheumatoid arthritis)    Past Surgical History:  Past Surgical History  Procedure Laterality Date  . Colostomy    . Abdominal hysterectomy    . Colonoscopy    . Upper gastrointestinal endoscopy    . Eye surgery  cataract x 2  . Colonoscopy N/A 05/08/2012    Procedure: COLONOSCOPY;  Surgeon: Rogene Houston, MD;  Location: [facility];  Service: Endoscopy;  Laterality: N/A;  730  . Bone spurs  08/07/13    Right Shoulder    Subjective Symptoms/Limitations Symptoms: S: I haven't really been doing too much of the exercises at home. Pain Assessment Currently in Pain?: No/denies  Precautions/Restrictions  Precautions Precautions: None  Assessment Additional Assessments RUE AROM (degrees) RUE Overall AROM Comments: assessed in seated. IR/ER adducted Right Shoulder Flexion: 165 Degrees (last progress note: 155) Right Shoulder ABduction: 170 Degrees (last progress note: 160) Right Shoulder Internal Rotation: 90 Degrees (same last progress note) Right Shoulder External Rotation: 90 Degrees (same at last progress note) RUE Strength Right Shoulder Flexion: 5/5 (last progress note: 4/5) Right Shoulder ABduction: 5/5 (last progress note: 4/5) Right Shoulder Internal Rotation: 5/5 (last progress note: 4/5) Right Shoulder External Rotation: 5/5 (last progress note: 4/5) Palpation Palpation: Zero fascial  restrictions in right upper arm, trapezius, scapularis region.     Exercise/Treatments    Manual Therapy Manual Therapy: Myofascial release Myofascial Release: Myofascial release (MFR) and manual stretching to right upper arm, scapular region, shoulder and upper trapezius region to decrease pain and fascial restrictions and improve pain free mobility  Occupational Therapy Assessment and Plan OT Assessment and Plan Clinical Impression Statement: A: Reassessment and discharge completed this date. Patient has met all therapy goals and is agreeable to discharge. Patient is to continue following HEP at home independently. OT Plan: P: D/C from therapy.    Goals Short Term Goals Time to Complete Short Term Goals: 3 weeks Short Term Goal 1: Patient will be educated on a HEP. Short Term Goal 2: Patient will improve PROM to [facility] in her right shoulder for increased ability to curl her hair and remove her blouse without difficulty. Short Term Goal 3: Patient will improve right shoulder strength to 3+/5 for increased ability to lift bags of grocery out of her car. Short Term Goal 4: Patient will decrease pain in her right shoulder to2/10 when completing daily activities.  Short Term Goal 5: Patient will decrease fascial restrictions to min-mod in her right shoulder region.  Long Term Goals Time to Complete Long Term Goals: 6 weeks Long Term Goal 1: Patient will return to prior level of independence with all B/IADLs and leisure activities. Long Term Goal 1 Progress: Met Long Term Goal 2: Patient will improve AROM to WNL in her right shoulder for increased ability to curl her hair and remove her blouse without difficulty. Long Term Goal 2 Progress: Met Long Term Goal 3: Patient will improve right  shoulder strength to 5/5 for increased ability to shift gears in her husbands car. Long Term Goal 3 Progress: Met Long Term Goal 4: Patient will decrease pain in her right shoulder to 2/10 when completing  daily activities.  Long Term Goal 4 Progress: Met Long Term Goal 5: Patient will decrease fascial restrictions to min in her right shoulder region.  Long Term Goal 5 Progress: Met  Problem List Patient Active Problem List   Diagnosis Date Noted  . Rapid heart rate 08/24/2013  . Status post subacromial decompression 08/14/2013  . Muscle weakness (generalized) 06/14/2013  . Rotator cuff syndrome of right shoulder 12/05/2012  . Right shoulder pain 12/05/2012  . Rectal bleeding 06/06/2011  . Epigastric pain 04/19/2011  . GERD (gastroesophageal reflux disease) 04/19/2011  . IBS (irritable bowel syndrome) 04/19/2011  . METATARSALGIA 05/12/2009  . BUNION 05/12/2009    End of Session Activity Tolerance: Patient tolerated treatment well General Behavior During Therapy: WFL for tasks assessed/performed  GO Functional Assessment Tool Used: FOTO score: 76/100 (24% impaired) Functional Limitation: Carrying, moving and handling objects Carrying, Moving and Handling Objects Current Status ([id number]): At least 20 percent but less than 40 percent impaired, limited or restricted Carrying, Moving and Handling Objects Goal Status [phone]): At least 1 percent but less than 20 percent impaired, limited or restricted Carrying, Moving and Handling Objects Discharge Status [phone]): At least 20 percent but less than 40 percent impaired, limited or restricted  Ailene Ravel, OTR/L,CBIS   10/09/2013, 1:34 PM  Physician Documentation Your signature is required to indicate approval of the treatment plan as stated above.  Please sign and either send electronically or make a copy of this report for your files and return this physician signed original.  Please mark one 1.__approve of plan  2. ___approve of plan with the following conditions.   ______________________________                                                          _____________________ Physician Signature                                                                                                              Date

## 2013-10-24 ENCOUNTER — Encounter (INDEPENDENT_AMBULATORY_CARE_PROVIDER_SITE_OTHER): Payer: Self-pay | Admitting: *Deleted

## 2013-10-24 ENCOUNTER — Ambulatory Visit (INDEPENDENT_AMBULATORY_CARE_PROVIDER_SITE_OTHER): Payer: Medicare Other | Admitting: Internal Medicine

## 2013-10-24 ENCOUNTER — Other Ambulatory Visit (INDEPENDENT_AMBULATORY_CARE_PROVIDER_SITE_OTHER): Payer: Self-pay | Admitting: *Deleted

## 2013-10-24 DIAGNOSIS — R1310 Dysphagia, unspecified: Secondary | ICD-10-CM

## 2013-10-29 ENCOUNTER — Ambulatory Visit (INDEPENDENT_AMBULATORY_CARE_PROVIDER_SITE_OTHER): Payer: Medicare Other | Admitting: Internal Medicine

## 2013-11-04 ENCOUNTER — Encounter (HOSPITAL_COMMUNITY)
Admission: RE | Disposition: A | Discharge status: Home / Self Care | Payer: Self-pay | Source: Ambulatory Visit | Attending: Internal Medicine

## 2013-11-04 ENCOUNTER — Encounter (HOSPITAL_COMMUNITY): Payer: Self-pay | Admitting: *Deleted

## 2013-11-04 ENCOUNTER — Ambulatory Visit (HOSPITAL_COMMUNITY)
Admission: RE | Admit: 2013-11-04 | Discharge: 2013-11-04 | Disposition: A | Discharge status: Home / Self Care | Payer: Medicare Other | Source: Ambulatory Visit | Attending: Internal Medicine | Admitting: Internal Medicine

## 2013-11-04 DIAGNOSIS — F329 Major depressive disorder, single episode, unspecified: Secondary | ICD-10-CM | POA: Insufficient documentation

## 2013-11-04 DIAGNOSIS — Z800 Family history of malignant neoplasm of digestive organs: Secondary | ICD-10-CM | POA: Diagnosis not present

## 2013-11-04 DIAGNOSIS — K2960 Other gastritis without bleeding: Secondary | ICD-10-CM

## 2013-11-04 DIAGNOSIS — R1310 Dysphagia, unspecified: Secondary | ICD-10-CM | POA: Diagnosis present

## 2013-11-04 DIAGNOSIS — K449 Diaphragmatic hernia without obstruction or gangrene: Secondary | ICD-10-CM | POA: Diagnosis not present

## 2013-11-04 DIAGNOSIS — Z79899 Other long term (current) drug therapy: Secondary | ICD-10-CM | POA: Diagnosis not present

## 2013-11-04 DIAGNOSIS — R630 Anorexia: Secondary | ICD-10-CM | POA: Insufficient documentation

## 2013-11-04 DIAGNOSIS — F3289 Other specified depressive episodes: Secondary | ICD-10-CM | POA: Insufficient documentation

## 2013-11-04 DIAGNOSIS — R110 Nausea: Secondary | ICD-10-CM | POA: Insufficient documentation

## 2013-11-04 DIAGNOSIS — Z87891 Personal history of nicotine dependence: Secondary | ICD-10-CM | POA: Diagnosis not present

## 2013-11-04 DIAGNOSIS — K222 Esophageal obstruction: Secondary | ICD-10-CM

## 2013-11-04 DIAGNOSIS — K219 Gastro-esophageal reflux disease without esophagitis: Secondary | ICD-10-CM

## 2013-11-04 HISTORY — PX: ESOPHAGOGASTRODUODENOSCOPY: SHX5428

## 2013-11-04 HISTORY — PX: MALONEY DILATION: SHX5535

## 2013-11-04 SURGERY — EGD (ESOPHAGOGASTRODUODENOSCOPY)
Anesthesia: Moderate Sedation

## 2013-11-04 MED ORDER — STERILE WATER FOR IRRIGATION IR SOLN
Status: DC | PRN
  Administered 2013-11-04: 08:00:00

## 2013-11-04 MED ORDER — PANTOPRAZOLE SODIUM 40 MG PO TBEC: 40.0000 mg | DELAYED_RELEASE_TABLET | Freq: Every day | ORAL | Status: DC

## 2013-11-04 MED ORDER — MIDAZOLAM HCL 5 MG/5ML IJ SOLN
INTRAMUSCULAR | Status: DC | PRN
  Administered 2013-11-04 (×2): 2 mg via INTRAVENOUS
  Administered 2013-11-04: 1 mg via INTRAVENOUS
  Administered 2013-11-04: 2 mg via INTRAVENOUS

## 2013-11-04 MED ORDER — SODIUM CHLORIDE 0.9 % IV SOLN
INTRAVENOUS | Status: DC
  Administered 2013-11-04: 07:00:00 via INTRAVENOUS

## 2013-11-04 MED ORDER — BUTAMBEN-TETRACAINE-BENZOCAINE 2-2-14 % EX AERO
INHALATION_SPRAY | CUTANEOUS | Status: AC
  Filled 2013-11-04: qty 56

## 2013-11-04 MED ORDER — MEPERIDINE HCL 50 MG/ML IJ SOLN
INTRAMUSCULAR | Status: AC
  Filled 2013-11-04: qty 1

## 2013-11-04 MED ORDER — FENTANYL CITRATE 0.05 MG/ML IJ SOLN
INTRAMUSCULAR | Status: DC | PRN
  Administered 2013-11-04 (×2): 25 ug via INTRAVENOUS

## 2013-11-04 MED ORDER — FENTANYL CITRATE 0.05 MG/ML IJ SOLN
INTRAMUSCULAR | Status: AC
  Filled 2013-11-04: qty 2

## 2013-11-04 MED ORDER — ONDANSETRON HCL 4 MG/2ML IJ SOLN
INTRAMUSCULAR | Status: AC
  Filled 2013-11-04: qty 2

## 2013-11-04 MED ORDER — MIDAZOLAM HCL 5 MG/5ML IJ SOLN
INTRAMUSCULAR | Status: AC
  Filled 2013-11-04: qty 10

## 2013-11-04 NOTE — Op Note (Signed)
EGD PROCEDURE REPORT  PATIENT:  Erica [name]  MR#:  [id number] Birthdate:  [birth year], 80 y.o., female Endoscopist:  Dr. Malissa Hippo, MD Referred By:  Dr. Carylon Perches, MD  Procedure Date: 11/04/2013  Procedure:   EGD with ED.  Indications:  Patient is 80 year old Caucasian female who presents with solid food dysphagia. She says heartburn is well controlled with famotidine.            Informed Consent:  The risks, benefits, alternatives & imponderables which include, but are not limited to, bleeding, infection, perforation, drug reaction and potential missed lesion have been reviewed.  The potential for biopsy, lesion removal, esophageal dilation, etc. have also been discussed.  Questions have been answered.  All parties agreeable.  Please see history & physical in medical record for more information.  Medications:  Fentanyl 50 mcg IV Versed 7 mg IV Cetacaine spray topically for oropharyngeal anesthesia  Description of procedure:  The endoscope was introduced through the mouth and advanced to the second portion of the duodenum without difficulty or limitations. The mucosal surfaces were surveyed very carefully during advancement of the scope and upon withdrawal.  Findings:  Esophagus:  Mucosa of the esophagus was normal. Single erosion noted at GE junction. GEJ:  34 cm Hiatus:  37 cm Stomach:  Stomach was empty and distended very well with insufflation. Folds in the proximal stomach were normal. Examination of mucosa at body, antrum, pyloric channel, angularis, fundus and cardia was normal. Duodenum:  Normal bulbar and post bulbar mucosa.  Therapeutic/Diagnostic Maneuvers Performed:   Esophagus was dilated by passing 54 [place] Maloney dilator to full insertion. Esophageal mucosa was reexamined post dilation and small linear mucosal disruption noted to proximal esophagus just below the UES  Complications:  None  Impression: Small sliding hiatal hernia with single erosion at GE  junction but no evidence of Schatzki's ring or esophageal stricture. Esophagus was dilated by passing 54 [place] Maloney dilator resulting in small linear mucosal disruption at proximal esophagus indicative of a web.  Recommendations:  Continue anti-reflux measures. Discontinue famotidine. Pantoprazole 40 mg by mouth every morning. Office visit in 8 weeks  Gussie Murton U  11/04/2013  8:03 AM  CC: Dr. Carylon Perches, MD & Dr. Bonnetta Barry ref. provider found

## 2013-11-04 NOTE — H&P (Signed)
Erica [name] is an 80 y.o. female.   Chief Complaint: Patient is here for EGD and ED. HPI: Patient is a 26-year-old Caucasian female who presents with several week history of dysphagia to solids. Heartburn is well controlled with famotidine. She has intermittent nausea and anorexia. She denies vomiting abdominal pain or melena. He has history of depression her symptoms are well-controlled with present therapy. She is under care of psychiatrist. She is on Remeron. Wellbutrin was added recently but she stopped after 2-1/2 weeks because of side effects.  Past Medical History  Diagnosis Date  . Irritable bowel syndrome   . Thyroid condition   . Depression   . RA (rheumatoid arthritis)     Past Surgical History  Procedure Laterality Date  . Colostomy    . Abdominal hysterectomy    . Colonoscopy    . Upper gastrointestinal endoscopy    . Eye surgery  cataract x 2  . Colonoscopy N/A 05/08/2012    Procedure: COLONOSCOPY;  Surgeon: Malissa Hippo, MD;  Location: [facility];  Service: Endoscopy;  Laterality: N/A;  730  . Bone spurs  08/07/13    Right Shoulder    Family History  Problem Relation Age of Onset  . Colon cancer Mother   . Atrial fibrillation Son    Social History:  reports that she quit smoking about 32 years ago. Her smoking use included Cigarettes. She smoked 0.00 packs per day. She has never used smokeless tobacco. She reports that she does not drink alcohol or use illicit drugs.  Allergies:  Allergies  Allergen Reactions  . Penicillins     Medications Prior to Admission  Medication Sig Dispense Refill  . buPROPion (WELLBUTRIN SR) 100 MG 12 hr tablet Take 100 mg by mouth 2 (two) times daily.      . Cyanocobalamin (VITAMIN B-12) 1000 MCG SUBL Place under the tongue daily.      . famotidine (PEPCID) 20 MG tablet Take 1 tablet (20 mg total) by mouth 2 (two) times daily.      . folic acid (FOLVITE) 1 MG tablet Take 1 mg by mouth at bedtime.       Marland Kitchen levothyroxine  (SYNTHROID) 75 MCG tablet Take 75 mcg by mouth daily.      Marland Kitchen loperamide (IMODIUM A-D) 2 MG tablet Take 0.5 tablets (1 mg total) by mouth 2 (two) times daily.  30 tablet  0  . LORazepam (ATIVAN) 0.5 MG tablet Take 0.5 mg by mouth every morning.       . Methotrexate, PF, 10 MG/0.4ML SOAJ Inject 0.4 mLs into the skin once a week. saturday      . mirtazapine (REMERON) 30 MG tablet Take 30 mg by mouth at bedtime.      . bifidobacterium infantis (ALIGN) capsule Take 1 capsule by mouth daily.      . ondansetron (ZOFRAN) 4 MG tablet Take 1 tablet (4 mg total) by mouth 2 (two) times daily as needed for nausea or vomiting.  30 tablet  1    No results found for this or any previous visit (from the past 48 hour(s)). No results found.  ROS  Blood pressure 186/100, pulse 106, temperature 97.6 F (36.4 C), temperature source Oral, resp. rate 18, height 5\' 3"  (1.6 m), weight 121 lb (54.885 kg), SpO2 96.00%. Physical Exam  Constitutional: She appears well-developed and well-nourished.  HENT:  Mouth/Throat: Oropharynx is clear and moist.  Eyes: Conjunctivae are normal. No scleral icterus.  Neck: No thyromegaly  present.  Cardiovascular: Normal rate, regular rhythm and normal heart sounds.   No murmur heard. Respiratory: Effort normal and breath sounds normal.  GI: Soft. She exhibits no distension and no mass. There is no tenderness.  Lymphadenopathy:    She has no cervical adenopathy.  Neurological: She is alert.  Skin: Skin is warm and dry.     Assessment/Plan Solid food dysphagia. GERD. EGD with ED.  Erica [name] U 11/04/2013, 7:36 AM

## 2013-11-04 NOTE — Discharge Instructions (Signed)
Discontinue famotidine. Resume other medications as before.  Pantoprazole 40 mg by mouth 30 minutes before breakfast daily. No driving for 63-OVFIE. Office visit in 8 weeks. Esophageal Dilatation The esophagus is the long, narrow tube which carries food and liquid from the mouth to the stomach. Esophageal dilatation is the technique used to stretch a blocked or narrowed portion of the esophagus. This procedure is used when a part of the esophagus has become so narrow that it becomes difficult, painful or even impossible to swallow. This is generally an uncomplicated form of treatment. When this is not successful, chest surgery may be required. This is a much more extensive form of treatment with a longer recovery time. CAUSES  Some of the more common causes of blockage or strictures of the esophagus are:  Narrowing from longstanding inflammation (soreness and redness) of the lower esophagus. This comes from the constant exposure of the lower esophagus to the acid which bubbles up from the stomach. Over time this causes scarring and narrowing of the lower esophagus.  Hiatal hernia in which a small part of the stomach bulges (herniates) up through the diaphragm. This can cause a gradual narrowing of the end of the esophagus.  Schatzki ring is a narrow ring of benign (non-cancerous) fibrous tissue which constricts the lower esophagus. The reason for this is not known.  Scleroderma is a connective tissue disorder that affects the esophagus and makes swallowing difficult.  Achalasia is an absence of nerves to the lower esophagus and to the esophageal sphincter. This is the circular muscle between the stomach and esophagus that relaxes to allow food into the stomach. After swallowing, it contracts to keep food in the stomach. This absence of nerves may be congenital (present since birth). This can cause irregular spasms of the lower esophageal muscle. This spasm does not open up to allow food and fluid  through. The result is a persistent blockage with subsequent slow trickling of the esophageal contents into the stomach.  Strictures may develop from swallowing materials which damage the esophagus. Some examples are strong acids or alkalis such as lye.  Growths such as benign (non-cancerous) and malignant (cancerous) tumors can block the esophagus.  Hereditary (present since birth) causes. DIAGNOSIS  Your caregiver often suspects this problem by taking a medical history. They will also do a physical exam. They can then prove their suspicions using X-rays and endoscopy. Endoscopy is an exam in which a tube like a small, flexible telescope is used to look at your esophagus.  TREATMENT There are different stretching (dilating) techniques that can be used. Simple bougie dilatation may be done in the office. This usually takes only a couple minutes. A numbing (anesthetic) spray of the throat is used. Endoscopy, when done, is done in an endoscopy suite under mild sedation. When fluoroscopy is used, the procedure is performed in X-ray. Other techniques require a little longer time. Recovery is usually quick. There is no waiting time to begin eating and drinking to test success of the treatment. Following are some of the methods used. Narrowing of the esophagus is treated by making it bigger. Commonly this is a mechanical problem which can be treated with stretching. This can be done in different ways. Your caregiver will discuss these with you. Some of the means used are:  A series of graduated (increasing thickness) flexible dilators can be used. These are weighted tubes passed through the esophagus into the stomach. The tubes used become progressively larger until the desired stretched size is  reached. Graduated dilators are a simple and quick way of opening the esophagus. No visualization is required.  Another method is the use of endoscopy to place a flexible wire across the stricture. The endoscope is  removed and the wire left in place. A dilator with a hole through it from end to end is guided down the esophagus and across the stricture. One or more of these dilators are passed over the wire. At the end of the exam, the wire is removed. This type of treatment may be performed in the X-ray department under fluoroscopy. An advantage of this procedure is the examiner is visualizing the end opening in the esophagus.  Stretching of the esophagus may be done using balloons. Deflated balloons are placed through the endoscope and across the stricture. This type of balloon dilatation is often done at the time of endoscopy or fluoroscopy. Flexible endoscopy allows the examiner to directly view the stricture. A balloon is inserted in the deflated form into the area of narrowing. It is then inflated with air to a certain pressure that is preset for a given circumference. When inflated, it becomes sausage shaped, stretched, and makes the stricture larger.  Achalasia requires a longer, larger balloon-type dilator. This is frequently done under X-ray control. In this situation, the spastic muscle fibers in the lower esophagus are stretched. All of the above procedures make the passage of food and water into the stomach easier. They also make it easier for stomach contents to reflux back into the esophagus. Special medications may be used following the procedure to help prevent further stricturing. Proton-pump inhibitor medications are good at decreasing the amount of acid in the stomach juice. When stomach juice refluxes into the esophagus, the juice is no longer as acidic and is less likely to burn or scar the esophagus. RISKS AND COMPLICATIONS Esophageal dilatation is usually performed effectively and without problems. Some complications that can occur are:  A small amount of bleeding almost always happens where the stretching takes place. If this is too excessive it may require more aggressive treatment.  An  uncommon complication is perforation (making a hole) of the esophagus. The esophagus is thin. It is easy to make a hole in it. If this happens, an operation may be necessary to repair this.  A small, undetected perforation could lead to an infection in the chest. This can be very serious. HOME CARE INSTRUCTIONS   If you received sedation for your procedure, do not drive, make important decisions, or perform any activities requiring your full coordination. Do not drink alcohol, take sedatives, or use any mind altering chemicals unless instructed by your caregiver.  You may use throat lozenges or warm salt water gargles if you have throat discomfort.  You can begin eating and drinking normally on return home unless instructed otherwise. Do not purposely try to force large chunks of food down to test the benefits of your procedure.  Mild discomfort can be eased with sips of ice water.  Medications for discomfort may or may not be needed. SEEK IMMEDIATE MEDICAL CARE IF:   You begin vomiting up blood.  You develop black, tarry stools.  You develop chills or an unexplained temperature of over 101F (38.3C)  You develop chest or abdominal pain.  You develop shortness of breath, or feel light-headed or faint.  Your swallowing is becoming more painful, difficult, or you are unable to swallow. MAKE SURE YOU:   Understand these instructions.  Will watch your condition.  Will get  help right away if you are not doing well or get worse. Document Released: 04/21/2005 Document Revised: 07/15/2013 Document Reviewed: 06/08/2005 [facility] Patient Information 2015 [place], [place]. This information is not intended to replace advice given to you by your health care provider. Make sure you discuss any questions you have with your health care provider. Gastrointestinal Endoscopy, Care After Refer to this sheet in the next few weeks. These instructions provide you with information on caring for yourself  after your procedure. Your caregiver may also give you more specific instructions. Your treatment has been planned according to current medical practices, but problems sometimes occur. Call your caregiver if you have any problems or questions after your procedure. HOME CARE INSTRUCTIONS  If you were given medicine to help you relax (sedative), do not drive, operate machinery, or sign important documents for 24 hours.  Avoid alcohol and hot or warm beverages for the first 24 hours after the procedure.  Only take over-the-counter or prescription medicines for pain, discomfort, or fever as directed by your caregiver. You may resume taking your normal medicines unless your caregiver tells you otherwise. Ask your caregiver when you may resume taking medicines that may cause bleeding, such as aspirin, clopidogrel, or warfarin.  You may return to your normal diet and activities on the day after your procedure, or as directed by your caregiver. Walking may help to reduce any bloated feeling in your abdomen.  Drink enough fluids to keep your urine clear or pale yellow.  You may gargle with salt water if you have a sore throat. SEEK IMMEDIATE MEDICAL CARE IF:  You have severe nausea or vomiting.  You have severe abdominal pain, abdominal cramps that last longer than 6 hours, or abdominal swelling (distention).  You have severe shoulder or back pain.  You have trouble swallowing.  You have shortness of breath, your breathing is shallow, or you are breathing faster than normal.  You have a fever or a rapid heartbeat.  You vomit blood or material that looks like coffee grounds.  You have bloody, black, or tarry stools. MAKE SURE YOU:  Understand these instructions.  Will watch your condition.  Will get help right away if you are not doing well or get worse. Document Released: 10/13/2003 Document Revised: 07/15/2013 Document Reviewed: 05/31/2011 [facility] Patient Information 2015 [place],  [place]. This information is not intended to replace advice given to you by your health care provider. Make sure you discuss any questions you have with your health care provider.

## 2013-11-06 ENCOUNTER — Telehealth (INDEPENDENT_AMBULATORY_CARE_PROVIDER_SITE_OTHER): Payer: Self-pay | Admitting: *Deleted

## 2013-11-06 NOTE — Telephone Encounter (Signed)
Patient saw her Psychiatrist day before yesterday. They put her on a new anti depressant ,Brintellix. She was told that this medication would cause nausea but after a week this should subside. Patient has Zofran 4 mg - she has taken 2 at a time and states that this has not touched it (Nausea). This is causing her not to be able to eat , loosing weight , and cannot sleep.  Patient ask if there is anything else that she may take? Call back number is [phone].

## 2013-11-07 ENCOUNTER — Encounter (HOSPITAL_COMMUNITY): Payer: Self-pay | Admitting: Internal Medicine

## 2013-11-07 NOTE — Telephone Encounter (Signed)
See Ms. Todd,s note

## 2013-11-07 NOTE — Telephone Encounter (Signed)
Patient was called and made aware. Aware that Dr.Rehman states that she may take the Zofran as directed and if the medication is causing this bad of nausea ,she should contact the prescribing doctor and let them know about this. As we  Cannot tell the patient to stop this medication.

## 2013-11-12 ENCOUNTER — Ambulatory Visit (INDEPENDENT_AMBULATORY_CARE_PROVIDER_SITE_OTHER): Payer: Medicare Other | Admitting: Internal Medicine

## 2013-11-27 ENCOUNTER — Encounter (HOSPITAL_COMMUNITY): Payer: Self-pay

## 2013-11-27 ENCOUNTER — Other Ambulatory Visit (HOSPITAL_COMMUNITY): Payer: No Typology Code available for payment source | Attending: Psychiatry | Admitting: Psychiatry

## 2013-11-27 DIAGNOSIS — Z90710 Acquired absence of both cervix and uterus: Secondary | ICD-10-CM | POA: Insufficient documentation

## 2013-11-27 DIAGNOSIS — F411 Generalized anxiety disorder: Secondary | ICD-10-CM | POA: Insufficient documentation

## 2013-11-27 DIAGNOSIS — K589 Irritable bowel syndrome without diarrhea: Secondary | ICD-10-CM | POA: Diagnosis not present

## 2013-11-27 DIAGNOSIS — F4000 Agoraphobia, unspecified: Secondary | ICD-10-CM | POA: Diagnosis not present

## 2013-11-27 DIAGNOSIS — F331 Major depressive disorder, recurrent, moderate: Secondary | ICD-10-CM

## 2013-11-27 DIAGNOSIS — E039 Hypothyroidism, unspecified: Secondary | ICD-10-CM | POA: Diagnosis not present

## 2013-11-27 DIAGNOSIS — F332 Major depressive disorder, recurrent severe without psychotic features: Secondary | ICD-10-CM | POA: Diagnosis present

## 2013-11-27 DIAGNOSIS — M069 Rheumatoid arthritis, unspecified: Secondary | ICD-10-CM | POA: Diagnosis not present

## 2013-11-27 DIAGNOSIS — Z609 Problem related to social environment, unspecified: Secondary | ICD-10-CM | POA: Diagnosis not present

## 2013-11-27 DIAGNOSIS — Z87891 Personal history of nicotine dependence: Secondary | ICD-10-CM | POA: Diagnosis not present

## 2013-11-27 DIAGNOSIS — Z933 Colostomy status: Secondary | ICD-10-CM | POA: Diagnosis not present

## 2013-11-27 NOTE — Progress Notes (Signed)
Erica [name] is a 80 y.o., married, Caucasian female, who was referred per Dr. Nolen Mu; treatment for depressive and anxiety symptoms.  Denies SI/HI, paranoia and A/V hallucinations.  Denies any prior suicide attempts or gestures.  Symptoms include:  Decreased appetite (lost 8-9 lbs since 08-26-13), sadness, low energy, poor concentration during the day, anhedonia, restlessness, lack of motivation, feelings of hopelessness, helplessness and worthlessness.  According to pt, she has been depressed for years.  Symptoms worsened in May 2015.  Stressors:  1) On 08-07-13, pt had shoulder surgery.  She had bone spurs removed.  States she had to be off Nardel medication for three weeks before the surgery.  On 08-12-13 she was started on Remeron.  2)  Husband's medical issues:  Was diagnosed with Diabetes six months ago.  He is having memory loss.  According to pt, she started feeling overwhelmed whenever he was diagnosed.  "I started worrying about meal planning for his special diet."  3)  Housing situation:  Pt states she would like to downsize to a smaller place, but husband is a Teacher, English as a foreign language and doesn't want to move [place]  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  Pt received ECT during the stay at [facility].  Has been seeing Dr. Nolen Mu since 1996 and has seen Dr. Ollen Gross twice.     Family Hx:  Maternal GM (Depression); Mother (Mood swings and anxiety) Childhood:  Born in [place], [place]; grew up in [place], [place].  States she grew up with mother having severe mood swings and anxiety.  Pt was an only child.  Reports that school was [employer].  Denies any trauma or abuse. Pt has been married for fifty six yrs.  Three adult kids:  Two sons and one daughter.  Support system includes husband and kids. Denies drugs/ETOH.  Denies DUI's or legal issues.  Former cigarette smoker; quit in 1980's. Pt  completed all forms.  Scored 37 on the burns.  Pt will attend [facility] for two weeks.  A:  Oriented pt.  Provided pt with an orientation folder.  Informed Dr. Nolen Mu and Dr. Andrey Campanile of admit.  Encouraged support groups.  R:  Pt receptive.

## 2013-11-27 NOTE — Progress Notes (Signed)
    Daily Group Progress Note  Program: IOP  Group Time: 9:00-10:30  Participation Level: Active  Behavioral Response: Appropriate  Type of Therapy:  Group Therapy  Summary of Progress: Pt. Met with case manager and psychiatrist.     Group Time: 10:30-12:00  Participation Level:  Active  Behavioral Response: Appropriate  Type of Therapy: Psycho-education Group  Summary of Progress: Pt. Met with case manager and psychiatrist.  Brown, Jennifer B, COUNS 

## 2013-11-28 ENCOUNTER — Encounter (HOSPITAL_COMMUNITY): Payer: Self-pay | Admitting: Psychiatry

## 2013-11-28 ENCOUNTER — Other Ambulatory Visit (HOSPITAL_COMMUNITY): Payer: No Typology Code available for payment source | Admitting: Psychiatry

## 2013-11-28 DIAGNOSIS — F331 Major depressive disorder, recurrent, moderate: Secondary | ICD-10-CM

## 2013-11-28 DIAGNOSIS — F332 Major depressive disorder, recurrent severe without psychotic features: Secondary | ICD-10-CM | POA: Diagnosis not present

## 2013-11-28 NOTE — Progress Notes (Signed)
    Daily Group Progress Note  Program: IOP  Group Time: 9:00-10:30  Participation Level: Active  Behavioral Response: Appropriate  Type of Therapy:  Group Therapy  Summary of Progress: Pt. Reported that she did not sleep well last night. Pt. Reported that she experiences considerable generalized anxiety and feels "fearful" but cannot name what she is afraid of. Pt. Reports problems with medication and feels that it has not helped her. Pt. Reports major stressors are aging and feeling that she is a burden to others.      Group Time: 10:30-12:00  Participation Level:  Active  Behavioral Response: Appropriate  Type of Therapy: Psycho-education Group  Summary of Progress: Pt. Participated in group facilitated by mental health association.   Shaune Pollack, COUNS

## 2013-11-29 ENCOUNTER — Other Ambulatory Visit (HOSPITAL_COMMUNITY): Payer: No Typology Code available for payment source | Admitting: Psychiatry

## 2013-11-29 DIAGNOSIS — F332 Major depressive disorder, recurrent severe without psychotic features: Secondary | ICD-10-CM | POA: Diagnosis not present

## 2013-11-29 DIAGNOSIS — F331 Major depressive disorder, recurrent, moderate: Secondary | ICD-10-CM

## 2013-11-29 NOTE — Progress Notes (Signed)
    Daily Group Progress Note  Program: IOP  Group Time: 9:00-10:30  Participation Level: Active  Behavioral Response: Appropriate  Type of Therapy:  Group Therapy  Summary of Progress: Pt. Reported "I'm here" in response to question about how she was feeling. Pt. Explored her feelings about thoughts about being told by her children that she has a controlling personality. Pt. Was talkative and receptive to feedback from others in the group.      Group Time: 10:30-12:00  Participation Level:  Active  Behavioral Response: Appropriate  Type of Therapy: Psycho-education Group  Summary of Progress: Pt. Participated in discussion about building healthy boundaries.   Shaune Pollack, COUNS

## 2013-12-02 ENCOUNTER — Telehealth (INDEPENDENT_AMBULATORY_CARE_PROVIDER_SITE_OTHER): Payer: Self-pay | Admitting: *Deleted

## 2013-12-02 ENCOUNTER — Ambulatory Visit (INDEPENDENT_AMBULATORY_CARE_PROVIDER_SITE_OTHER): Payer: Medicare Other | Admitting: Internal Medicine

## 2013-12-02 ENCOUNTER — Telehealth (HOSPITAL_COMMUNITY): Payer: Self-pay | Admitting: Psychiatry

## 2013-12-02 ENCOUNTER — Other Ambulatory Visit (HOSPITAL_COMMUNITY): Payer: No Typology Code available for payment source | Admitting: Psychiatry

## 2013-12-02 NOTE — Telephone Encounter (Signed)
Erica [name] went to the restroom this am and is passing a lot of bright red blood. The return phone number is [phone].

## 2013-12-02 NOTE — Telephone Encounter (Signed)
Dr.Rehman was made aware. He states that this may be her hemorrhoid. If this continues , she is to call us and we will get a H/H. Patient was called and made aware.

## 2013-12-03 ENCOUNTER — Other Ambulatory Visit (HOSPITAL_COMMUNITY): Payer: No Typology Code available for payment source | Admitting: Psychiatry

## 2013-12-03 DIAGNOSIS — F331 Major depressive disorder, recurrent, moderate: Secondary | ICD-10-CM

## 2013-12-03 DIAGNOSIS — F332 Major depressive disorder, recurrent severe without psychotic features: Secondary | ICD-10-CM | POA: Diagnosis not present

## 2013-12-03 NOTE — Progress Notes (Signed)
    Daily Group Progress Note  Program: IOP  Group Time: 9:00-10:30  Participation Level: Active  Behavioral Response: Appropriate  Type of Therapy:  Group Therapy  Summary of Progress: Pt. Is talkative and laughs appropriately. Pt. Reports frustration regarding medication, doesn't feel like her medications are working and concerns that her psychiatrist outside of the program wants to make changes.     Group Time: 10:30-12:00  Participation Level:  Active  Behavioral Response: Appropriate  Type of Therapy: Psycho-education Group  Summary of Progress: Pt. Participated in discussion about developing attitude of acceptance towards anxiety.  Shaune Pollack, COUNS

## 2013-12-03 NOTE — Progress Notes (Signed)
Psychiatric Assessment Adult  Patient Identification:  Erica [name] Date of Evaluation:  12/03/2013 Chief Complaint: Depression and anxiety History of Chief Complaint:  80 y.o., married, Caucasian female, who was referred per Dr. Nolen Mu; treatment for depressive and anxiety symptoms. Denies SI/HI, paranoia and A/V hallucinations. Denies any prior suicide attempts or gestures. Symptoms include: Decreased appetite (lost 8-9 lbs since 08-26-13), sadness, low energy, poor concentration during the day, anhedonia, restlessness, lack of motivation, feelings of hopelessness, helplessness and worthlessness. According to pt, she has been depressed for years. Symptoms worsened in May 2015. Stressors: 1) On 08-07-13, pt had shoulder surgery. She had bone spurs removed. States she had to be off Nardel medication for three weeks before the surgery. On 08-12-13 she was started on Remeron. 2) Husband's medical issues: Was diagnosed with Diabetes six months ago. He is having memory loss. According to pt, she started feeling overwhelmed whenever he was diagnosed. "I started worrying about meal planning for his special diet." 3) Housing situation: Pt states she would like to downsize to a smaller place, but husband is a Teacher, English as a foreign language and doesn't want to move from [place], [place]. 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 Pt received ECT during the stay at [facility]. Has been seeing Dr. Nolen Mu since 1996 and has seen Dr. Ollen Gross twice.  Family Hx: Maternal GM (Depression); Mother (Mood swings and anxiety)  Childhood: Born in [place], [place]; grew up in [place], [place]. States she grew up with mother having severe mood swings and anxiety. Pt was an only child. Reports that school was [employer]. Denies any trauma or abuse.  Pt has been married for fifty six yrs. Three adult kids: Two sons and one daughter. Support system  includes husband and kids.  Denies drugs/ETOH. Denies DUI's or legal issues. Former cigarette smoker; quit in 1980's.  Marland Kitchen Spoke with Dr. Nolen Mu and updated her.  HPI a Review of Systems Physical Exam  Depressive Symptoms: depressed mood, anhedonia, psychomotor retardation, fatigue, difficulty concentrating, impaired memory, recurrent thoughts of death, anxiety, disturbed sleep, weight loss,  (Hypo) Manic Symptoms:  None  Anxiety Symptoms: Excessive Worry:  Yes Panic Symptoms:  No Agoraphobia:  Yes Obsessive Compulsive: No  Symptoms: None, Specific Phobias:  No Social Anxiety:  Yes  Psychotic Symptoms: None    PTSD Symptoms: None   Traumatic Brain Injury: No   Past Psychiatric History: Diagnosis: Depression and anxiety   [facility]: [facility] and also at [facility]. patient has received ECT for her depression   Outpatient Care: Sees Dr. Emerson Monte since 1996   Substance Abuse Care:   Self-Mutilation:   Suicidal Attempts:   Violent Behaviors:    Past Medical History:  Status post colostomy for irritable bowel, status post hysterectomy, colitis and hypothyroidism Past Medical History  Diagnosis Date  . Irritable bowel syndrome   . Thyroid condition   . Depression   . RA (rheumatoid arthritis)   . Anxiety    History of Loss of Consciousness:  No Seizure History:  No Cardiac History:  No Allergies:   Allergies  Allergen Reactions  . Penicillins    Current Medications:  Current Outpatient Prescriptions  Medication Sig Dispense Refill  . Cyanocobalamin (VITAMIN B-12) 1000 MCG SUBL Place under the tongue daily.      . folic acid (FOLVITE) 1 MG tablet Take 1 mg by mouth at bedtime.       Marland Kitchen  levothyroxine (SYNTHROID) 75 MCG tablet Take 75 mcg by mouth daily.      Marland Kitchen loperamide (IMODIUM A-D) 2 MG tablet Take 0.5 tablets (1 mg total) by mouth 2 (two) times daily.  30 tablet  0  . LORazepam (ATIVAN) 0.5 MG tablet  Take 0.5 mg by mouth 2 (two) times daily. Take half a tab twice a day      . Methotrexate, PF, 10 MG/0.4ML SOAJ Inject 0.4 mLs into the skin once a week. saturday      . mirtazapine (REMERON) 30 MG tablet Take 30 mg by mouth at bedtime.      . pantoprazole (PROTONIX) 40 MG tablet Take 1 tablet (40 mg total) by mouth daily before breakfast.  30 tablet  5  . Vortioxetine HBr (BRINTELLIX) 5 MG TABS Take by mouth.       No current facility-administered medications for this visit.    Previous Psychotropic Medications:  Medication Dose   BuSpar Remeron Brintelix Wellbutrin                        Substance Abuse History in the last 12 months: Substance Age of 1st Use Last Use Amount Specific Type  Nicotine  teenager   quit in the 80s     Alcohol      Cannabis      Opiates      Cocaine      Methamphetamines      LSD      Ecstasy      Benzodiazepines      Caffeine      Inhalants      Others:                          Medical Consequences of Substance Abuse:   Legal Consequences of Substance Abuse:   Family Consequences of Substance Abuse:   Blackouts:  No DT's:  No Withdrawal Symptoms:  No None  Social History: Current Place of Residence:  Place of Birth:  Family Members:  Marital Status:  Married Children:  Sons: 2  Daughters: 1 Relationships:  Education:  Corporate treasurer Problems/Performance:  Religious Beliefs/Practices:  History of Abuse: none Teacher, music History:  None. Legal History: None Hobbies/Interests:   Family History:   Family History  Problem Relation Age of Onset  . Colon cancer Mother   . Bipolar disorder Mother   . Anxiety disorder Mother   . Atrial fibrillation Son   . Depression Maternal Grandmother     Mental Status Examination/Evaluation: Objective:  Appearance: Casual and Well Groomed  Eye Contact::  Good  Speech:  Clear and Coherent and Normal Rate  Volume:  Normal  Mood:  Very anxious and  dysphoric   Affect:  Constricted and Restricted  Thought Process:  Circumstantial  Orientation:  Full (Time, Place, and Person)  Thought Content:  Rumination  Suicidal Thoughts:  No  Homicidal Thoughts:  No  Judgement:  Fair  Insight:  Fair  Psychomotor Activity:  Normal  Akathisia:  No  Handed:  Right  AIMS (if indicated):  0  Assets:  Communication Skills Desire for Improvement Physical Health Resilience Social Support Transportation    Laboratory/X-Ray Psychological Evaluation(s)          AXIS I Generalized Anxiety Disorder and Major Depression, Recurrent severe  AXIS II Obsessive- Compulsive Personality TRAITS  AXIS III Past Medical History  Diagnosis Date  . Irritable bowel syndrome   .  Thyroid condition   . Depression   . RA (rheumatoid arthritis)   . Anxiety      AXIS IV problems related to social environment and problems with primary support group  AXIS V 51-60 moderate symptoms   Treatment Plan/Recommendations:  Plan of Care: Start IOP   Laboratory:  None at this time  Psychotherapy: Group individual and milieu therapy   Medications: Patient will be continued on all her medications. I spoke with Dr. Emerson Monte and discussed the case I did talk to her regarding the possibility of adding Risperdal because of the obsessive  quality of patient's thinking and she is comfortable with that, will discuss this with the patient. Patient now states she would like ECT. Was informed that the first wait and see how she doing groups   Routine PRN Medications:  Yes  Consultations:   Safety Concerns:  None   Other:  Estimated length of stay 2 weeks      9/22/20151:09 PM

## 2013-12-04 ENCOUNTER — Other Ambulatory Visit (HOSPITAL_COMMUNITY): Payer: No Typology Code available for payment source | Admitting: Psychiatry

## 2013-12-04 DIAGNOSIS — F331 Major depressive disorder, recurrent, moderate: Secondary | ICD-10-CM

## 2013-12-04 DIAGNOSIS — F332 Major depressive disorder, recurrent severe without psychotic features: Secondary | ICD-10-CM | POA: Diagnosis not present

## 2013-12-04 NOTE — Progress Notes (Signed)
    Daily Group Progress Note  Program: IOP  Group Time: 9:00-10:30  Participation Level: Active  Behavioral Response: Appropriate  Type of Therapy:  Group Therapy  Summary of Progress: Pt. Grieving loss of former self due to aging. Pt. Reports concerns that her medications are not working.      Group Time: 10:30-12:00  Participation Level:  Active  Behavioral Response: Appropriate  Type of Therapy: Psycho-education Group  Summary of Progress: Pt. Participated in grief and loss facilitated by Theda Belfast.   Shaune Pollack, COUNS

## 2013-12-05 ENCOUNTER — Other Ambulatory Visit (HOSPITAL_COMMUNITY): Payer: No Typology Code available for payment source | Admitting: Psychiatry

## 2013-12-05 DIAGNOSIS — F332 Major depressive disorder, recurrent severe without psychotic features: Secondary | ICD-10-CM | POA: Diagnosis not present

## 2013-12-05 DIAGNOSIS — F331 Major depressive disorder, recurrent, moderate: Secondary | ICD-10-CM

## 2013-12-05 MED ORDER — RISPERIDONE 0.25 MG PO TABS: 1.0000 mg | ORAL_TABLET | Freq: Two times a day (BID) | ORAL | Status: DC

## 2013-12-05 MED ORDER — RISPERIDONE 0.25 MG PO TABS: 0.2500 mg | ORAL_TABLET | Freq: Every day | ORAL | Status: DC

## 2013-12-05 NOTE — Progress Notes (Signed)
Patient ID: Erica [name], female   DOB: 02/24/1933, 80 y.o.   MRN: [id number] She was seen today, states that Dr. Nolen Mu has increased her brain to Brintelixx 10 mg every day and has discontinued her Ativan and switched her to Klonopin 0.5 mg every day. Patient continues to ruminate processed with her the process of aging and grieving the loss of her U. patient stated understanding. She continues to ruminate and to help her with this I discussed the rationale risks benefits options of Risperdal and patient is given me her informed consent. Patient will start Risperdal 0.25 mg every afternoon. She'll continue her other medications patient denies suicidal or homicidal ideation no hallucinations or delusions.

## 2013-12-05 NOTE — Progress Notes (Signed)
    Daily Group Progress Note  Program: IOP  Group Time: 9:00-10:30  Participation Level: Active  Behavioral Response: Appropriate  Type of Therapy:  Group Therapy  Summary of Progress: Pt. Reported improved mood from yesterday's session. Pt. Presented as talkative, smiled appropriately, provided and received appropriate feedback.  Pt. Attributed mood to communication with her psychiatrist who agreed to change her medication. Pt. Reported less anxiety than in previous days.      Group Time: 10:30-12:00  Participation Level:  Active  Behavioral Response: Appropriate  Type of Therapy: Psycho-education Group  Summary of Progress: Pt. Participated in reflective reading and guided visualization "Putting the backpack down."  Shaune Pollack, COUNS

## 2013-12-06 ENCOUNTER — Other Ambulatory Visit (HOSPITAL_COMMUNITY): Payer: No Typology Code available for payment source | Admitting: Psychiatry

## 2013-12-06 DIAGNOSIS — F332 Major depressive disorder, recurrent severe without psychotic features: Secondary | ICD-10-CM | POA: Diagnosis not present

## 2013-12-06 DIAGNOSIS — F331 Major depressive disorder, recurrent, moderate: Secondary | ICD-10-CM

## 2013-12-06 NOTE — Progress Notes (Signed)
    Daily Group Progress Note  Program: IOP  Group Time: 9:00-10:30  Participation Level: Active  Behavioral Response: Appropriate  Type of Therapy:  Group Therapy  Summary of Progress: Pt. Presented with bright mood, smiled and talked appropriately. Pt. Reported that "yesterday was her best day since May". Pt. Attributes her change in mood to increase in her medication.      Group Time: 10:30-12:00  Participation Level:  Active  Behavioral Response: Appropriate  Type of Therapy: Psycho-education Group  Summary of Progress: Pt. Participated in discussion about developing self-compassion.   Shaune Pollack, COUNS

## 2013-12-09 ENCOUNTER — Other Ambulatory Visit (HOSPITAL_COMMUNITY): Payer: No Typology Code available for payment source | Admitting: Psychiatry

## 2013-12-09 DIAGNOSIS — F332 Major depressive disorder, recurrent severe without psychotic features: Secondary | ICD-10-CM | POA: Diagnosis not present

## 2013-12-09 DIAGNOSIS — F331 Major depressive disorder, recurrent, moderate: Secondary | ICD-10-CM

## 2013-12-09 NOTE — Progress Notes (Signed)
    Daily Group Progress Note  Program: IOP  Group Time: 9:00-10:30  Participation Level: Active  Behavioral Response: Appropriate  Type of Therapy:  Group Therapy  Summary of Progress: Pt. Reported that she had a bad weekend. Pt. Presented as talkative, offering feedback to others. Pt. Talks and interacts with others appropriately. Pt. Continues to report feeling very depressed.      Group Time: 10:30-12:00  Participation Level:  Active  Behavioral Response: Appropriate  Type of Therapy: Psycho-education Group  Summary of Progress: Pt. Participated in grief and loss facilitated by Theda Belfast.   Shaune Pollack, COUNS

## 2013-12-10 ENCOUNTER — Other Ambulatory Visit (HOSPITAL_COMMUNITY): Payer: No Typology Code available for payment source | Admitting: Psychiatry

## 2013-12-10 DIAGNOSIS — F331 Major depressive disorder, recurrent, moderate: Secondary | ICD-10-CM

## 2013-12-10 DIAGNOSIS — F332 Major depressive disorder, recurrent severe without psychotic features: Secondary | ICD-10-CM | POA: Diagnosis not present

## 2013-12-10 NOTE — Progress Notes (Signed)
    Daily Group Progress Note  Program: IOP  Group Time: 9:00-10:30  Participation Level: Active  Behavioral Response: Appropriate  Type of Therapy:  Group Therapy  Summary of Progress: Pt. Reported that she was feeling "ok". Pt. Continues to be challenged by loss related to aging process. Pt. Talked and smiled appropriately, provided appropriate feedback to group members.      Group Time: 10:30-12:00  Participation Level:  Active  Behavioral Response: Appropriate  Type of Therapy: Psycho-education Group  Summary of Progress: Pt. Participated in discussion about developing self-compassion. Pt. Watched and discussed Clovia Cuff video.  Shaune Pollack, COUNS

## 2013-12-11 ENCOUNTER — Other Ambulatory Visit (HOSPITAL_COMMUNITY): Payer: No Typology Code available for payment source | Admitting: Psychiatry

## 2013-12-11 DIAGNOSIS — F332 Major depressive disorder, recurrent severe without psychotic features: Secondary | ICD-10-CM | POA: Diagnosis not present

## 2013-12-11 NOTE — Progress Notes (Signed)
    Daily Group Progress Note  Program: IOP  Group Time: 9:00-10:30  Participation Level: Active  Behavioral Response: Appropriate  Type of Therapy:  Group Therapy  Summary of Progress: Pt. Prepared for discharge from the group. Pt. Processed feelings of loss related to aging and ongoing management of her depression.      Group Time: 10:30-12:00  Participation Level:  Active  Behavioral Response: Appropriate  Type of Therapy: Psycho-education Group  Summary of Progress: Pt. Participated in discussion about developing vulnerability to counter shame. Pt. Watched and discussed Dewain Penning video.  Shaune Pollack, COUNS

## 2013-12-11 NOTE — Progress Notes (Signed)
Erica [name] is a 80 y.o. , married, Caucasian female, who was referred per Dr. Nolen Mu; treatment for depressive and anxiety symptoms. Denied SI/HI, paranoia and A/V hallucinations. Denied any prior suicide attempts or gestures. Symptoms included: Decreased appetite (lost 8-9 lbs since 08-26-13), sadness, low energy, poor concentration during the day, anhedonia, restlessness, lack of motivation, feelings of hopelessness, helplessness and worthlessness. According to pt, she has been depressed for years. Symptoms worsened in May 2015. Stressors: 1) On 08-07-13, pt had shoulder surgery. She had bone spurs removed. States she had to be off Nardel medication for three weeks before the surgery. On 08-12-13 she was started on Remeron. 2) Husband's medical issues: Was diagnosed with Diabetes six months ago. He is having memory loss. According to pt, she started feeling overwhelmed whenever he was diagnosed. "I started worrying about meal planning for his special diet." 3) Housing situation: Pt states she would like to downsize to a smaller place, but husband is a Teacher, English as a foreign language and doesn't want to move [place], [place]. 4) Medical Issues: IBS, Hypothyroidism, Colitis, and Rheumatoid Arthritis  Two prior psychiatric admissions yrs ago Research officer, political party and [facility]). Pt received ECT during the stay at [facility]. Has been seeing Dr. Nolen Mu since 1996 and has seen Dr. Ollen Gross twice.  Family Hx: Maternal GM (Depression); Mother (Mood swings and anxiety)  Childhood: Born in [place], [place]; grew up in [place], [place]. States she grew up with mother having severe mood swings and anxiety. Pt was an only child. Reports that school was [employer]. Denies any trauma or abuse.  Pt has been married for fifty six yrs. Three adult kids: Two sons and one daughter. Support system includes husband and kids.  Denies drugs/ETOH. Denies DUI's or legal issues. Former cigarette smoker; quit in 1980's.  Pt completed MH-IOP today.   Continues to deny SI/HI or A/V hallucinations.  Reports overall mood improved; continues to c/o decreased appetite.  "I have a positive outlook now on my future."  According to pt, MH-IOP provided her with the structure she needed.  "I need to keep up staying active everyday."  Pt reports she continues to struggle with seeing husband's signs of dementia.  A:  D/C today.  F/U with Dr. Andrey Campanile on 12-12-13 @ 11 a.m and Dr. Nolen Mu on 12-26-13 @ 2:15 pm.  Encouraged support groups and volunteering.  R:  Pt receptive.

## 2013-12-11 NOTE — Patient Instructions (Signed)
Patient completed MH-IOP today.  Follow up with Dr. Ollen Gross on 12-12-13 @ 11 a.m and Dr. Nolen Mu on 12-26-13 @ 2:15 pm.  Encouraged support groups and volunteering.

## 2013-12-12 ENCOUNTER — Other Ambulatory Visit (HOSPITAL_COMMUNITY): Payer: Medicare Other | Attending: Psychiatry

## 2013-12-13 ENCOUNTER — Other Ambulatory Visit (HOSPITAL_COMMUNITY): Payer: Medicare Other

## 2013-12-16 ENCOUNTER — Other Ambulatory Visit (HOSPITAL_COMMUNITY): Payer: Medicare Other

## 2013-12-16 ENCOUNTER — Telehealth (INDEPENDENT_AMBULATORY_CARE_PROVIDER_SITE_OTHER): Payer: Self-pay | Admitting: *Deleted

## 2013-12-16 DIAGNOSIS — K625 Hemorrhage of anus and rectum: Secondary | ICD-10-CM

## 2013-12-16 NOTE — Telephone Encounter (Addendum)
Attempted to reach [place], left a message. Discussed this telephone message with Dr.Rehman, he ask that we get CBC first, then we may get Sigmoidoscopy. A referral may be needed for her hemorrhoids. Order sent to [facility] , will notify [place].  Per Dr.Rehman may call in Anusol [facility] Suppository 1 per rectal at bedtime. Patient is to keep a diary of the bleeding for the rest of the month. Angelique Blonder was made aware, Rx was called to [facility].

## 2013-12-16 NOTE — Telephone Encounter (Signed)
Angelique Blonder said Naomi called her stating she was bleeding again. Angelique Blonder had Philena take pictures so she would not have to guess and they showed a good amount. If Tammy would please return her call at [phone].

## 2013-12-17 ENCOUNTER — Other Ambulatory Visit (HOSPITAL_COMMUNITY): Payer: Medicare Other

## 2013-12-17 LAB — CBC
HCT: 45.4 % (ref 36.0–46.0)
HEMOGLOBIN: 15 g/dL (ref 12.0–15.0)
MCH: 32.9 pg (ref 26.0–34.0)
MCHC: 33 g/dL (ref 30.0–36.0)
MCV: 99.6 fL (ref 78.0–100.0)
Platelets: 228 10*3/uL (ref 150–400)
RBC: 4.56 MIL/uL (ref 3.87–5.11)
RDW: 14 % (ref 11.5–15.5)
WBC: 6 10*3/uL (ref 4.0–10.5)

## 2013-12-18 ENCOUNTER — Other Ambulatory Visit (HOSPITAL_COMMUNITY): Payer: Medicare Other

## 2013-12-19 ENCOUNTER — Other Ambulatory Visit (HOSPITAL_COMMUNITY): Payer: Medicare Other

## 2013-12-20 ENCOUNTER — Other Ambulatory Visit (HOSPITAL_COMMUNITY): Payer: Medicare Other

## 2013-12-23 ENCOUNTER — Other Ambulatory Visit (HOSPITAL_COMMUNITY): Payer: Medicare Other

## 2013-12-24 ENCOUNTER — Other Ambulatory Visit (HOSPITAL_COMMUNITY): Payer: Medicare Other

## 2013-12-25 ENCOUNTER — Other Ambulatory Visit (HOSPITAL_COMMUNITY): Payer: Medicare Other

## 2013-12-26 ENCOUNTER — Other Ambulatory Visit (HOSPITAL_COMMUNITY): Payer: Medicare Other

## 2013-12-26 NOTE — Progress Notes (Signed)
Discharge Note  Patient:  Erica [name] is an 80 y.o., female DOB:  1932-06-09  Date of Admission:  11/27/13  Date of Discharge:  12/11/13  Reason for Admission:81 y.o., married, Caucasian female, who was referred per Dr. Nolen Mu; treatment for depressive and anxiety symptoms. Denies SI/HI, paranoia and A/V hallucinations. Denies any prior suicide attempts or gestures. Symptoms include: Decreased appetite (lost 8-9 lbs since 08-26-13), sadness, low energy, poor concentration during the day, anhedonia, restlessness, lack of motivation, feelings of hopelessness, helplessness and worthlessness. According to pt, she has been depressed for years. Symptoms worsened in May 2015. Stressors: 1) On 08-07-13, pt had shoulder surgery. She had bone spurs removed. States she had to be off Nardel medication for three weeks before the surgery. On 08-12-13 she was started on Remeron. 2) Husband's medical issues: Was diagnosed with Diabetes six months ago. He is having memory loss. According to pt, she started feeling overwhelmed whenever he was diagnosed. "I started worrying about meal planning for his special diet." 3) Housing situation: Pt states she would like to downsize to a smaller place, but husband is a Teacher, English as a foreign language and doesn't want to move from [place], [place]. 4) Medical Issues: IBS, Hypothyroidism, Colitis, and Rheumatoid Arthritis  Two prior psychiatric admissions yrs ago Research officer, political party and [facility]). Pt received ECT during the stay at [facility]. Has been seeing Dr. Nolen Mu since 1996 and has seen Dr. Ollen Gross twice.  Family Hx: Maternal GM (Depression); Mother (Mood swings and anxiety)  Childhood: Born in [place], [place]; grew up in [place], [place]. States she grew up with mother having severe mood swings and anxiety. Pt was an only child. Reports that school was [employer]. Denies any trauma or abuse.  Pt has been married for fifty six yrs. Three adult kids: Two sons and one daughter. Support system includes  husband and kids.  Denies drugs/ETOH. Denies DUI's or legal issues. Former cigarette smoker; quit in 1980's.     Hospital Course: Patient started IOP, and was continued on her medications. Dr. Nolen Mu and then increased her Brintillix. 10 mg every day and started her on Klonopin 0.5 mg every day. Patient continued to ruminate and I spoke with Dr. Nolen Mu to discuss the addition of Risperdal to help her rumination. Dr. Nolen Mu is comfortable with that and I discussed this with the patient and started her on Risperdal 0.25 mg patient never took the Risperdal stating that the Brintellix. increase had helped her, patient gradually stabilized talk about her stressors and came to a decision that she would move into a assisted living even if her husband did not want to do so. Patient was relieved about making that decision. Her sleep and appetite were good mood was good with no suicidal or homicidal ideation she was coping well and tolerating her medications well.  Mental  Status at Discharge: alert, oriented x3, affect was full mood was euthymic speech and language are normal. Musculoskeletal is normal no suicidal or homicidal ideation. No hallucinations or delusions. Recent and remote memory was good, judgment and insight was good, concentration and recall was good.   Lab Results: No results found for this or any previous visit (from the past 48 hour(s)).  Current outpatient prescriptions:Cyanocobalamin (VITAMIN B-12) 1000 MCG SUBL, Place under the tongue daily., Disp: , Rfl: ;  folic acid (FOLVITE) 1 MG tablet, Take 1 mg by mouth at bedtime. , Disp: , Rfl: ;  levothyroxine (SYNTHROID) 75 MCG tablet, Take 75 mcg by mouth daily.,  Disp: , Rfl: ;  loperamide (IMODIUM A-D) 2 MG tablet, Take 0.5 tablets (1 mg total) by mouth 2 (two) times daily., Disp: 30 tablet, Rfl: 0 Methotrexate, PF, 10 MG/0.4ML SOAJ, Inject 0.4 mLs into the skin once a week. saturday, Disp: , Rfl: ;  mirtazapine (REMERON) 30 MG tablet,  Take 30 mg by mouth at bedtime., Disp: , Rfl: ;  pantoprazole (PROTONIX) 40 MG tablet, Take 1 tablet (40 mg total) by mouth daily before breakfast., Disp: 30 tablet, Rfl: 5;  Vortioxetine HBr (BRINTELLIX) 5 MG TABS, Take 10 mg by mouth. , Disp: , Rfl:   Axis Diagnosis:   Axis I: Generalized Anxiety Disorder and Major Depression, Recurrent severe Axis II: Cluster C Traits Axis III:  Past Medical History  Diagnosis Date  . Irritable bowel syndrome   . Thyroid condition   . Depression   . RA (rheumatoid arthritis)   . Anxiety    Axis IV: other psychosocial or environmental problems, problems related to social environment and problems with primary support group Axis V: 61-70 mild symptoms   Level of Care:  OP  Discharge destination:  Home  Is patient on multiple antipsychotic therapies at discharge:  No    Has Patient had three or more failed trials of antipsychotic monotherapy by history:  No  Patient phone:  [phone] (home)  Patient address:   [address] [place] [address],   Follow-up recommendations:  Activity:  As tolerated Diet:  Regular  Comments:   followup for medications with Dr. Nolen Mu therapy with Dr. Ollen Gross.   The patient received suicide prevention pamphlet:  Yes   Margit Banda 12/26/2013, 2:04 PM

## 2013-12-27 ENCOUNTER — Other Ambulatory Visit (HOSPITAL_COMMUNITY): Payer: Medicare Other

## 2013-12-30 ENCOUNTER — Other Ambulatory Visit (HOSPITAL_COMMUNITY): Payer: Medicare Other

## 2013-12-31 ENCOUNTER — Other Ambulatory Visit (HOSPITAL_COMMUNITY): Payer: Medicare Other

## 2014-01-01 ENCOUNTER — Other Ambulatory Visit (HOSPITAL_COMMUNITY): Payer: Medicare Other

## 2014-01-02 ENCOUNTER — Other Ambulatory Visit (HOSPITAL_COMMUNITY): Payer: Medicare Other

## 2014-01-03 ENCOUNTER — Other Ambulatory Visit (HOSPITAL_COMMUNITY): Payer: Medicare Other

## 2014-01-06 ENCOUNTER — Other Ambulatory Visit (HOSPITAL_COMMUNITY): Payer: Medicare Other

## 2014-01-07 ENCOUNTER — Other Ambulatory Visit (HOSPITAL_COMMUNITY): Payer: Medicare Other

## 2014-01-14 ENCOUNTER — Encounter (INDEPENDENT_AMBULATORY_CARE_PROVIDER_SITE_OTHER): Payer: Self-pay | Admitting: Internal Medicine

## 2014-01-14 ENCOUNTER — Ambulatory Visit (INDEPENDENT_AMBULATORY_CARE_PROVIDER_SITE_OTHER): Payer: Medicare Other | Admitting: Internal Medicine

## 2014-01-14 VITALS — BP 120/76 | HR 90 | Temp 98.8°F | Resp 18 | Ht 63.0 in | Wt 121.5 lb

## 2014-01-14 DIAGNOSIS — R110 Nausea: Secondary | ICD-10-CM

## 2014-01-14 DIAGNOSIS — K219 Gastro-esophageal reflux disease without esophagitis: Secondary | ICD-10-CM

## 2014-01-14 DIAGNOSIS — K589 Irritable bowel syndrome without diarrhea: Secondary | ICD-10-CM

## 2014-01-14 NOTE — Patient Instructions (Signed)
Can use Dulcolax suppository or Fleet enema on as-needed basis

## 2014-01-14 NOTE — Progress Notes (Signed)
Presenting complaint;  Follow for GERD dysphagia and IBS.  Persistent nausea.  Subjective:  Patient is 80 year old Caucasian female who is here for scheduled visit accompanied by her husband.she was last seen in the office on 09/19/2013. She underwent EGD with ED on 11/04/2013 and noted to have small sliding hiatal hernia and single erosion at GE junction. Esophagus was dilated with 50 French Maloney dilator resulting in mucosal disruption or cervical esophagus indicative of a web. Patient  Continues to feel poorly. She remains with constant nausea which is worse in the morning. However she is not having any dysphagia or heartburn. She does not have a good appetite and is force-feeding herself. She has not lost any weight since her last visit. She remains with irregular bowel movements. She has anywhere from 1-5 bowel movements daily. Consistency varies from loose to formed. She has not spins hematochezia since she was last treated with Anusol HC suppositories. She feels her depression is not controlled with new medication. She is given on this medication for 7 weeks. She has an appointment with her psychiatrist in 3 weeks. She is using 1-2 Imodium tablets daily.   Current Medications: Outpatient Encounter Prescriptions as of 01/14/2014  Medication Sig  . clonazePAM (KLONOPIN) 0.5 MG tablet Take 0.5 mg by mouth. Patient states that she  Takes 1/4 of a tablet on an as needed basis  . Cyanocobalamin (VITAMIN B-12) 1000 MCG SUBL Place under the tongue daily.  . folic acid (FOLVITE) 1 MG tablet Take 1 mg by mouth at bedtime.   Marland Kitchen levothyroxine (SYNTHROID) 75 MCG tablet Take 75 mcg by mouth daily.  Marland Kitchen loperamide (IMODIUM A-D) 2 MG tablet Take 0.5 tablets (1 mg total) by mouth 2 (two) times daily. (Patient taking differently: Take 1 mg by mouth daily. )  . Methotrexate, PF, 10 MG/0.4ML SOAJ Inject 0.4 mLs into the skin once a week. saturday  . mirtazapine (REMERON) 30 MG tablet Take 7 mg by mouth at  bedtime.   . pantoprazole (PROTONIX) 40 MG tablet Take 1 tablet (40 mg total) by mouth daily before breakfast.  . Vortioxetine HBr (BRINTELLIX) 5 MG TABS Take 10 mg by mouth at bedtime.      Objective: Blood pressure 120/76, pulse 90, temperature 98.8 F (37.1 C), temperature source Oral, resp. rate 18, height 5\' 3"  (1.6 m), weight 121 lb 8 oz (55.112 kg). Patient is alert and in no acute distress Conjunctiva is pink. Sclera is nonicteric Oropharyngeal mucosa is normal. No neck masses or thyromegaly noted. Cardiac exam with regular rhythm normal S1 and S2. No murmur or gallop noted. Lungs are clear to auscultation. Abdomen is full. Bowel sounds are normal. Abdomen is soft and nontender without organomegaly or masses. No LE edema or clubbing noted.    Assessment:  #1. Dysphagia has responded to esophageal dilation. #2. GERD.heartburn well controlled with PPI. #3. Nausea. This symptom appears to be secondary to depression and less likely due to methotrexate. I do not believe nausea is of GI origin. #4. Irritable bowel syndrome. She is still having intermittent diarrhea not believe things will improve when her depression is controlled.   Plan:  Patient will continue Imodium OTC 2-4 mg daily as needed. Continue pantoprazole at 40 mg by mouth every morning. Patient advised not to use OTC laxative if she becomes constipated and instead she should use Dulcolax suppository or Fleet enema. Office visit in 3 months.

## 2014-02-13 DIAGNOSIS — F339 Major depressive disorder, recurrent, unspecified: Secondary | ICD-10-CM | POA: Insufficient documentation

## 2014-02-21 DIAGNOSIS — E039 Hypothyroidism, unspecified: Secondary | ICD-10-CM | POA: Insufficient documentation

## 2014-02-21 DIAGNOSIS — I011 Acute rheumatic endocarditis: Secondary | ICD-10-CM | POA: Insufficient documentation

## 2014-04-08 ENCOUNTER — Telehealth (INDEPENDENT_AMBULATORY_CARE_PROVIDER_SITE_OTHER): Payer: Self-pay | Admitting: *Deleted

## 2014-04-08 NOTE — Telephone Encounter (Signed)
Erica [name] called wanting to see Dr. Karilyn Cota. She is very nauseated. Her next apt is in 2 weeks on 04/22/14 with Dr. Karilyn Cota. The return phone number is [phone].

## 2014-04-08 NOTE — Telephone Encounter (Signed)
Per Dr.Rehman the patient is to take Zofran twice a day by mouth daily on a schedule. She is to talk with her [facility] Doctor to make sure the medications that she is on is not causing her these symptoms. Patient should keep her appointment for February. Patient called and made aware.

## 2014-04-14 ENCOUNTER — Telehealth (INDEPENDENT_AMBULATORY_CARE_PROVIDER_SITE_OTHER): Payer: Self-pay | Admitting: *Deleted

## 2014-04-14 NOTE — Telephone Encounter (Signed)
Per Dr.Rehman the patient may take 2 every morning. Patient called and made aware.

## 2014-04-14 NOTE — Telephone Encounter (Signed)
Erica [name] is taking one tablet every morning of the the generic for Zophran. She would like to know if she can take 2 every morning? The return phone number is [phone].

## 2014-04-22 ENCOUNTER — Encounter (INDEPENDENT_AMBULATORY_CARE_PROVIDER_SITE_OTHER): Payer: Self-pay | Admitting: Internal Medicine

## 2014-04-22 ENCOUNTER — Ambulatory Visit (INDEPENDENT_AMBULATORY_CARE_PROVIDER_SITE_OTHER): Payer: Medicare Other | Admitting: Internal Medicine

## 2014-04-22 VITALS — BP 130/76 | HR 88 | Temp 97.2°F | Resp 18 | Ht 63.0 in | Wt 121.7 lb

## 2014-04-22 DIAGNOSIS — K219 Gastro-esophageal reflux disease without esophagitis: Secondary | ICD-10-CM

## 2014-04-22 DIAGNOSIS — R110 Nausea: Secondary | ICD-10-CM

## 2014-04-22 DIAGNOSIS — K589 Irritable bowel syndrome without diarrhea: Secondary | ICD-10-CM

## 2014-04-22 NOTE — Progress Notes (Signed)
Presenting complaint;  Follow-up for nausea GERD and IBS.  Subjective:  Patient is 81 year old Caucasian female who is here for scheduled visit. She was last seen on 01/14/2014. She's been getting ECT via [facility]. She calls this treatment zapping. She states she has been stacked 5 times. Most recently on 03/10/2014. She did great until ago. She is feeling depressed again and her nausea is back. She feels her memory may have decreased since she's been getting ECT but she has not experienced other side effects. She has an appointment with her psychiatrist in near future. She denies heartburn dysphagia or vomiting. She has intermittent diarrhea and urgency which he has not experienced rectal bleeding in about 4 weeks. She is accompanied by her daughter-in-law Angelique Blonder states she has done great with ECT until recently.   Current Medications: Outpatient Encounter Prescriptions as of 04/22/2014  Medication Sig  . clonazePAM (KLONOPIN) 0.5 MG tablet Take 0.5 mg by mouth. Patient states that she  Takes 1/2 of a tablet on an as needed basis  . Cyanocobalamin (VITAMIN B-12) 1000 MCG SUBL Place under the tongue daily.  . folic acid (FOLVITE) 1 MG tablet Take 1 mg by mouth at bedtime.   Marland Kitchen levothyroxine (SYNTHROID) 75 MCG tablet Take 75 mcg by mouth daily.  Marland Kitchen loperamide (IMODIUM A-D) 2 MG tablet Take 0.5 tablets (1 mg total) by mouth 2 (two) times daily. (Patient taking differently: Take 1 mg by mouth daily. )  . Methotrexate, PF, 10 MG/0.4ML SOAJ Inject 0.4 mLs into the skin once a week. saturday  . mirtazapine (REMERON) 30 MG tablet Take 7 mg by mouth at bedtime.   . ondansetron (ZOFRAN) 4 MG tablet Take 4 mg by mouth. Patient states that she takes 1 by mouth in the morning, if needed she will take another one at lunch.  . pantoprazole (PROTONIX) 40 MG tablet Take 1 tablet (40 mg total) by mouth daily before breakfast.  . Vortioxetine HBr (BRINTELLIX) 5 MG TABS Take 5 mg by mouth at bedtime.       Objective: Blood pressure 130/76, pulse 88, temperature 97.2 F (36.2 C), temperature source Oral, resp. rate 18, height 5\' 3"  (1.6 m), weight 121 lb 11.2 oz (55.203 kg). Patient is alert and in no acute distress. Conjunctiva is pink. Sclera is nonicteric Oropharyngeal mucosa is normal. No neck masses or thyromegaly noted. Cardiac exam with regular rhythm normal S1 and S2. No murmur or gallop noted. Lungs are clear to auscultation. Abdomen is symmetrical soft and nontender without organomegaly or masses. No LE edema or clubbing noted.    Assessment:  #1. Chronic GERD. Heartburn is well controlled therapy. She had EGD in August 2015 revealing small sliding hiatal hernia, erosion at GE junction and esophageal web. #2. Dysphagia has resolved since esophageal dilation 6 months ago. Dysphagia was felt to be secondary to esophageal web. #3. Nausea. This symptom is not of GI origin and most likely central due to depression. Nausea is back now that she is depressed again. #4. Irritable bowel syndrome. She is having intermittent diarrhea and urgency.   Plan:  Patient will continue pantoprazole 40 mg by mouth every morning. Continue ondansetron and as-needed basis. Office visit in 2 months.

## 2014-04-22 NOTE — Patient Instructions (Signed)
Remember to exercise daily even though it could be few minutes each time. Call if you have swallowing difficulty

## 2014-05-23 ENCOUNTER — Other Ambulatory Visit (INDEPENDENT_AMBULATORY_CARE_PROVIDER_SITE_OTHER): Payer: Self-pay | Admitting: Internal Medicine

## 2014-06-23 ENCOUNTER — Ambulatory Visit (INDEPENDENT_AMBULATORY_CARE_PROVIDER_SITE_OTHER): Payer: Medicare Other | Admitting: Internal Medicine

## 2014-06-23 ENCOUNTER — Encounter (INDEPENDENT_AMBULATORY_CARE_PROVIDER_SITE_OTHER): Payer: Self-pay | Admitting: Internal Medicine

## 2014-06-23 VITALS — BP 130/74 | HR 80 | Temp 97.8°F | Resp 18 | Ht 62.0 in | Wt 119.2 lb

## 2014-06-23 DIAGNOSIS — K219 Gastro-esophageal reflux disease without esophagitis: Secondary | ICD-10-CM

## 2014-06-23 DIAGNOSIS — K589 Irritable bowel syndrome without diarrhea: Secondary | ICD-10-CM

## 2014-06-23 DIAGNOSIS — R110 Nausea: Secondary | ICD-10-CM | POA: Diagnosis not present

## 2014-06-23 DIAGNOSIS — R634 Abnormal weight loss: Secondary | ICD-10-CM | POA: Diagnosis not present

## 2014-06-23 MED ORDER — ONDANSETRON HCL 4 MG PO TABS: 4.0000 mg | ORAL_TABLET | Freq: Two times a day (BID) | ORAL | Status: DC | PRN

## 2014-06-23 NOTE — Progress Notes (Addendum)
Presenting complaint;  Follow-up for nausea GERD and diarrhea.  Subjective:   Erica [name] is a 81 year old Caucasian female who is here for scheduled visit accompanied daughter-in-law Angelique Blonder. She was last seen 2 months ago. She is not feeling as well as she did on her last visit. She continues to complain of daily nausea which is worse when she wakes up. Ondansetron helps but she does not take it every day. Since her last visit she's only had one day of vomiting. She did not experience hematemesis or melena. She received ECT 3 days ago. She states is not helping as it dictated to begin with. She is thinking of having genetic testing which may help treatment of her depression. She states that test would cost $5000 and she'll have to pay herself. She is having 1 bowel movement per day. Most of her stools are mushy but not formed. She has not had urgency or diarrhea. She is taking Imodium every day. She has lost 2 pounds since her last visit. She was begun on cardiac exam for hypertension.   Current Medications: Outpatient Encounter Prescriptions as of 06/23/2014  Medication Sig  . BRINTELLIX 10 MG TABS Take 10 mg by mouth. Patient takes 5 mg in the morning , and 10 mg in the night.  . clonazePAM (KLONOPIN) 0.5 MG tablet Take 0.5 mg by mouth. Patient states that she  Takes 1/4 of a tablet every morning.  . Cyanocobalamin (VITAMIN B-12) 1000 MCG SUBL Place under the tongue daily.  Marland Kitchen diltiazem (CARDIZEM CD) 120 MG 24 hr capsule Take 120 mg by mouth daily.   . folic acid (FOLVITE) 1 MG tablet Take 1 mg by mouth at bedtime.   Marland Kitchen levothyroxine (SYNTHROID) 75 MCG tablet Take 75 mcg by mouth daily.  Marland Kitchen loperamide (IMODIUM A-D) 2 MG tablet Take 0.5 tablets (1 mg total) by mouth 2 (two) times daily. (Patient taking differently: Take 1 mg by mouth daily. )  . Methotrexate, PF, 10 MG/0.4ML SOAJ Inject 0.4 mLs into the skin once a week. saturday  . ondansetron (ZOFRAN) 4 MG tablet Take 4 mg by mouth. Patient states  that she takes 1 by mouth in the morning, if needed she will take another one at lunch.  . pantoprazole (PROTONIX) 40 MG tablet TAKE ONE TABLET BY MOUTH DAILY BEFORE BREAKFAST.  . mirtazapine (REMERON) 30 MG tablet Take 1 mg by mouth at bedtime.   . [DISCONTINUED] Vortioxetine HBr (BRINTELLIX) 5 MG TABS Take 5 mg by mouth at bedtime.      Objective: Blood pressure 130/74, pulse 80, temperature 97.8 F (36.6 C), temperature source Oral, resp. rate 18, height 5\' 2"  (1.575 m), weight 119 lb 3.2 oz (54.069 kg). Patient is alert and in no acute distress. Conjunctiva is pink. Sclera is nonicteric Oropharyngeal mucosa is normal. No neck masses or thyromegaly noted. Cardiac exam with regular rhythm normal S1 and S2. No murmur or gallop noted. Lungs are clear to auscultation. Abdomen symmetrical. Bowel sounds are normal. On palpation abdomen is soft and nontender without organomegaly or masses. No LE edema or clubbing noted. She has ulnar deviation to both hands; she has swollen MCPs and one trigger finger.    Assessment:  #1.  Chronic nausea most likely secondary to depression and/or antidepressant. Heartburn is well controlled with therapy therefore highly unlikely that nausea is due to gastroesophageal reflux disease. I do not believe nauseous due to methotrexate as it started even before she went on methotrexate. #2.  Chronic diarrhea secondary to IBS.  She is doing better with daily loperamide dose. #3.  Weight loss. She has lost 2 pounds in the last 2 months. Weight loss has slowed down and appears to be due to diminished appetite and caloric intake.   Plan:  Patient advised to take ondansetron 4 mg every morning take second dose an as-needed basis.  new prescription  Given for ondansetron 4 mg every morning and second dose on as-needed basis.  Patient will continue pantoprazole at 40 mg by mouth every morning.  Patient encouraged to walk and exercise which may help improve appetite.   office visit in 3 months.

## 2014-06-23 NOTE — Patient Instructions (Signed)
Take ondansetron 4 mg by mouth every morning and second dose on as-needed basis

## 2014-09-08 ENCOUNTER — Other Ambulatory Visit: Payer: Self-pay

## 2014-10-07 ENCOUNTER — Encounter (INDEPENDENT_AMBULATORY_CARE_PROVIDER_SITE_OTHER): Payer: Self-pay | Admitting: Internal Medicine

## 2014-10-07 ENCOUNTER — Ambulatory Visit (INDEPENDENT_AMBULATORY_CARE_PROVIDER_SITE_OTHER): Payer: Medicare Other | Admitting: Internal Medicine

## 2014-10-07 VITALS — BP 130/80 | HR 107 | Temp 98.1°F | Resp 18 | Ht 62.0 in | Wt 116.2 lb

## 2014-10-07 DIAGNOSIS — K219 Gastro-esophageal reflux disease without esophagitis: Secondary | ICD-10-CM

## 2014-10-07 DIAGNOSIS — R634 Abnormal weight loss: Secondary | ICD-10-CM | POA: Diagnosis not present

## 2014-10-07 DIAGNOSIS — K589 Irritable bowel syndrome without diarrhea: Secondary | ICD-10-CM

## 2014-10-07 NOTE — Progress Notes (Signed)
Presenting complaint;  Follow-up for GERD IBS and weight loss.  Subjective:  Erica [name] is 81 year old Caucasian female who is here for scheduled visit accompanied by Eber Jones whose been helping her and her husband 5 days a week. She does not feel well even though she is not having heartburn dysphagia nausea vomiting or diarrhea. On most days she has one to 2 formed stools. She's not had any accident in the last 3 months. He also denies abdominal pain melena or rectal bleeding. She says her appetite has not been good although she's been eating more over the last day or 2. She still feels depressed. She feels she is having side effects with and Dr. Fraser Din. She is also extremely worried about her husband was progressive dementia and is not doing well. She has lost 3 pounds since her last visit of 06/23/2014. She has lost 17 pounds since March 2013. She does not remember the last time she took ondansetron.   Current Medications: Outpatient Encounter Prescriptions as of 10/07/2014  Medication Sig  . ARIPiprazole (ABILIFY) 2 MG tablet Take 1 mg by mouth daily.   . clonazePAM (KLONOPIN) 0.5 MG tablet Take 0.5 mg by mouth. Patient states that she  Takes 1/4 of a tablet every morning.  . Cyanocobalamin (VITAMIN B-12) 1000 MCG SUBL Place under the tongue daily.  Marland Kitchen diltiazem (CARDIZEM CD) 120 MG 24 hr capsule Take 120 mg by mouth daily.   . folic acid (FOLVITE) 1 MG tablet Take 1 mg by mouth at bedtime.   Marland Kitchen levothyroxine (SYNTHROID) 75 MCG tablet Take 75 mcg by mouth daily.  Marland Kitchen loperamide (IMODIUM A-D) 2 MG tablet Take 0.5 tablets (1 mg total) by mouth 2 (two) times daily. (Patient taking differently: Take 1 mg by mouth daily. )  . Methotrexate, PF, 10 MG/0.4ML SOAJ Inject 0.4 mLs into the skin once a week. saturday  . pantoprazole (PROTONIX) 40 MG tablet TAKE ONE TABLET BY MOUTH DAILY BEFORE BREAKFAST.  Marland Kitchen Vortioxetine HBr (TRINTELLIX) 5 MG TABS Take 15 mg by mouth.  . ondansetron (ZOFRAN) 4 MG tablet Take  1 tablet (4 mg total) by mouth 2 (two) times daily as needed for nausea or vomiting. Patient states that she takes 1 by mouth in the morning, if needed she will take another one at lunch. (Patient not taking: Reported on 10/07/2014)  . [DISCONTINUED] BRINTELLIX 10 MG TABS Take 10 mg by mouth. Patient takes 5 mg in the morning , and 10 mg in the night.  . [DISCONTINUED] mirtazapine (REMERON) 30 MG tablet Take 1 mg by mouth at bedtime.    No facility-administered encounter medications on file as of 10/07/2014.     Objective: Blood pressure 130/80, pulse 107, temperature 98.1 F (36.7 C), temperature source Oral, resp. rate 18, height 5\' 2"  (1.575 m), weight 116 lb 3.2 oz (52.708 kg). Patient is alert and in no acute distress. Conjunctiva is pink. Sclera is nonicteric Oropharyngeal mucosa is normal. No neck masses or thyromegaly noted. Cardiac exam with regular rhythm normal S1 and S2. No murmur or gallop noted. Lungs are clear to auscultation. Abdomen is soft and nontender without organomegaly or masses. No LE edema or clubbing noted.    Assessment:  #1. GERD. Heartburns well controlled with therapy. She is not having any side effects with medication. #2.  IBS. She is doing well with 1 mg of loperamide every morning. #3.  Weight loss. Weight loss appears to be due to depression and diminished caloric intake. She should try to eat  snacks between meals.    Plan:  Continue loperamide 1 mg by mouth every morning. Continue pantoprazole 40 mg by mouth every morning Patient encouraged to eat at least 2 snacks between meals. Weight check in 6 weeks. Office visit in 4 months

## 2014-10-07 NOTE — Patient Instructions (Signed)
Weight check in 6 weeks. Try to eat at least 2 snacks between meals.

## 2015-02-10 ENCOUNTER — Encounter (INDEPENDENT_AMBULATORY_CARE_PROVIDER_SITE_OTHER): Payer: Self-pay | Admitting: Internal Medicine

## 2015-02-10 ENCOUNTER — Ambulatory Visit (INDEPENDENT_AMBULATORY_CARE_PROVIDER_SITE_OTHER): Payer: Medicare Other | Admitting: Internal Medicine

## 2015-02-10 VITALS — BP 130/88 | HR 110 | Temp 97.7°F | Resp 18 | Ht 62.0 in | Wt 120.9 lb

## 2015-02-10 DIAGNOSIS — K589 Irritable bowel syndrome without diarrhea: Secondary | ICD-10-CM

## 2015-02-10 DIAGNOSIS — K219 Gastro-esophageal reflux disease without esophagitis: Secondary | ICD-10-CM

## 2015-02-10 NOTE — Patient Instructions (Signed)
Starting in January 2017 take pantoprazole 40 mg by mouth every other day. Call with progress report after you have been on every other day schedule for 4 weeks

## 2015-02-10 NOTE — Progress Notes (Signed)
Presenting complaint;  Follow-up for GERD nausea and IBS.  Subjective:  Erica [name] is 81 year old Caucasian female who is here for scheduled visit. She was last seen in July this year. Since that visit she has gained 4 pounds. She feels well. She states her appetite decreased somewhat when she was switched from [place].the Cymbalta. She has not had any nausea since last visit. Therefore she has not taken ondansetron. She rarely has heartburn and she denies dysphagia. She is also having very few episodes of diarrhea and urgency. On most days she has 1-2 formed stools. Occasionally she may have a third bowel movement. She denies melena or rectal bleeding. Patient reports that she was visited by RN from her insurance carrier and was told she had thyroid nodules and should be checked. She also asked her to get off pantoprazole and had lot of further suggestions for the patient. Patient does not remember the name of this person. Patient states her husband is not doing well. He has dementia and he also has CHF.    Current Medications: Outpatient Encounter Prescriptions as of 02/10/2015  Medication Sig  . ARIPiprazole (ABILIFY) 2 MG tablet Take 1 mg by mouth daily.   . clonazePAM (KLONOPIN) 0.5 MG tablet Take 0.5 mg by mouth. Patient states that she  Takes 1/2 of a tablet every morning.  . Cyanocobalamin (VITAMIN B-12) 1000 MCG SUBL Place under the tongue daily.  Marland Kitchen diltiazem (CARDIZEM CD) 120 MG 24 hr capsule Take 120 mg by mouth daily.   . DULoxetine (CYMBALTA) 20 MG capsule Take 40 mg by mouth daily.   . folic acid (FOLVITE) 1 MG tablet Take 1 mg by mouth at bedtime.   . gabapentin (NEURONTIN) 100 MG capsule Take 100 mg by mouth 3 (three) times daily.   Marland Kitchen levothyroxine (SYNTHROID) 75 MCG tablet Take 75 mcg by mouth daily.  Marland Kitchen loperamide (IMODIUM A-D) 2 MG tablet Take 0.5 tablets (1 mg total) by mouth 2 (two) times daily. (Patient taking differently: Take 1 mg by mouth daily. )  . Methotrexate, PF, 10  MG/0.4ML SOAJ Inject 0.4 mLs into the skin once a week. saturday  . pantoprazole (PROTONIX) 40 MG tablet TAKE ONE TABLET BY MOUTH DAILY BEFORE BREAKFAST.  Marland Kitchen ondansetron (ZOFRAN) 4 MG tablet Take 1 tablet (4 mg total) by mouth 2 (two) times daily as needed for nausea or vomiting. Patient states that she takes 1 by mouth in the morning, if needed she will take another one at lunch. (Patient not taking: Reported on 10/07/2014)  . [DISCONTINUED] Vortioxetine HBr (TRINTELLIX) 5 MG TABS Take 15 mg by mouth.   No facility-administered encounter medications on file as of 02/10/2015.     Objective: Blood pressure 130/88, pulse 110, temperature 97.7 F (36.5 C), temperature source Oral, resp. rate 18, height 5\' 2"  (1.575 m), weight 120 lb 14.4 oz (54.84 kg). Patient is alert and in no acute distress. Conjunctiva is pink. Sclera is nonicteric Oropharyngeal mucosa is normal. No neck masses or thyromegaly noted. Cardiac exam with regular rhythm normal S1 and S2. No murmur or gallop noted. Lungs are clear to auscultation. Abdomen is symmetrical. Bowel sounds are normal. On palpation abdomen is soft and nontender without organomegaly or masses. She has small umbilical hernia which is completely reducible. No LE edema or clubbing noted.   Assessment:  #1. GERD. Symptoms well controlled with dietary measures and PPI. #2. Nausea has resolved. Nausea felt to be from early secondary to depression and stress disorder. #3. Irritable bowel syndrome.  She is doing well with dietary measures and loperamide.    Plan:  She will decrease pantoprazole 40 mg every other day starting in January 2017. Office visit in 6 months.

## 2015-03-12 ENCOUNTER — Emergency Department (HOSPITAL_COMMUNITY): Payer: Medicare Other

## 2015-03-12 ENCOUNTER — Encounter (HOSPITAL_COMMUNITY): Payer: Self-pay | Admitting: Emergency Medicine

## 2015-03-12 ENCOUNTER — Inpatient Hospital Stay (HOSPITAL_COMMUNITY)
Admission: EM | Admit: 2015-03-12 | Discharge: 2015-03-20 | DRG: 388 | Disposition: A | Discharge status: Home Health | Payer: Medicare Other | Attending: Internal Medicine | Admitting: Internal Medicine

## 2015-03-12 DIAGNOSIS — E876 Hypokalemia: Secondary | ICD-10-CM | POA: Diagnosis present

## 2015-03-12 DIAGNOSIS — M79651 Pain in right thigh: Secondary | ICD-10-CM | POA: Diagnosis present

## 2015-03-12 DIAGNOSIS — D72829 Elevated white blood cell count, unspecified: Secondary | ICD-10-CM | POA: Diagnosis present

## 2015-03-12 DIAGNOSIS — R52 Pain, unspecified: Secondary | ICD-10-CM

## 2015-03-12 DIAGNOSIS — K56600 Partial intestinal obstruction, unspecified as to cause: Secondary | ICD-10-CM

## 2015-03-12 DIAGNOSIS — F319 Bipolar disorder, unspecified: Secondary | ICD-10-CM | POA: Diagnosis present

## 2015-03-12 DIAGNOSIS — K5669 Other intestinal obstruction: Secondary | ICD-10-CM | POA: Diagnosis not present

## 2015-03-12 DIAGNOSIS — M5416 Radiculopathy, lumbar region: Secondary | ICD-10-CM | POA: Diagnosis present

## 2015-03-12 DIAGNOSIS — E860 Dehydration: Secondary | ICD-10-CM | POA: Diagnosis not present

## 2015-03-12 DIAGNOSIS — K56609 Unspecified intestinal obstruction, unspecified as to partial versus complete obstruction: Secondary | ICD-10-CM

## 2015-03-12 DIAGNOSIS — I959 Hypotension, unspecified: Secondary | ICD-10-CM | POA: Diagnosis present

## 2015-03-12 DIAGNOSIS — Z87891 Personal history of nicotine dependence: Secondary | ICD-10-CM | POA: Diagnosis not present

## 2015-03-12 DIAGNOSIS — Z933 Colostomy status: Secondary | ICD-10-CM | POA: Diagnosis not present

## 2015-03-12 DIAGNOSIS — R112 Nausea with vomiting, unspecified: Secondary | ICD-10-CM | POA: Diagnosis present

## 2015-03-12 DIAGNOSIS — E039 Hypothyroidism, unspecified: Secondary | ICD-10-CM | POA: Diagnosis present

## 2015-03-12 DIAGNOSIS — Z800 Family history of malignant neoplasm of digestive organs: Secondary | ICD-10-CM

## 2015-03-12 DIAGNOSIS — M419 Scoliosis, unspecified: Secondary | ICD-10-CM

## 2015-03-12 DIAGNOSIS — K589 Irritable bowel syndrome without diarrhea: Secondary | ICD-10-CM | POA: Diagnosis present

## 2015-03-12 DIAGNOSIS — M069 Rheumatoid arthritis, unspecified: Secondary | ICD-10-CM | POA: Diagnosis present

## 2015-03-12 DIAGNOSIS — R571 Hypovolemic shock: Secondary | ICD-10-CM | POA: Diagnosis present

## 2015-03-12 DIAGNOSIS — M5410 Radiculopathy, site unspecified: Secondary | ICD-10-CM | POA: Diagnosis not present

## 2015-03-12 DIAGNOSIS — Z818 Family history of other mental and behavioral disorders: Secondary | ICD-10-CM

## 2015-03-12 DIAGNOSIS — I9589 Other hypotension: Secondary | ICD-10-CM | POA: Diagnosis not present

## 2015-03-12 DIAGNOSIS — N179 Acute kidney failure, unspecified: Secondary | ICD-10-CM | POA: Diagnosis present

## 2015-03-12 DIAGNOSIS — R140 Abdominal distension (gaseous): Secondary | ICD-10-CM

## 2015-03-12 DIAGNOSIS — R339 Retention of urine, unspecified: Secondary | ICD-10-CM | POA: Diagnosis present

## 2015-03-12 DIAGNOSIS — E872 Acidosis, unspecified: Secondary | ICD-10-CM

## 2015-03-12 DIAGNOSIS — F32A Depression, unspecified: Secondary | ICD-10-CM

## 2015-03-12 DIAGNOSIS — R7989 Other specified abnormal findings of blood chemistry: Secondary | ICD-10-CM

## 2015-03-12 DIAGNOSIS — R778 Other specified abnormalities of plasma proteins: Secondary | ICD-10-CM

## 2015-03-12 DIAGNOSIS — F329 Major depressive disorder, single episode, unspecified: Secondary | ICD-10-CM

## 2015-03-12 LAB — URINALYSIS, ROUTINE W REFLEX MICROSCOPIC
BILIRUBIN URINE: NEGATIVE
Glucose, UA: NEGATIVE mg/dL
KETONES UR: NEGATIVE mg/dL
Leukocytes, UA: NEGATIVE
NITRITE: NEGATIVE
Protein, ur: 30 mg/dL — AB
Specific Gravity, Urine: 1.025 (ref 1.005–1.030)
pH: 5.5 (ref 5.0–8.0)

## 2015-03-12 LAB — CBC WITH DIFFERENTIAL/PLATELET
BASOS PCT: 0 %
Basophils Absolute: 0 10*3/uL (ref 0.0–0.1)
Eosinophils Absolute: 0 10*3/uL (ref 0.0–0.7)
Eosinophils Relative: 0 %
HEMATOCRIT: 53.5 % — AB (ref 36.0–46.0)
HEMOGLOBIN: 18.3 g/dL — AB (ref 12.0–15.0)
LYMPHS PCT: 9 %
Lymphs Abs: 1.3 10*3/uL (ref 0.7–4.0)
MCH: 34.5 pg — ABNORMAL HIGH (ref 26.0–34.0)
MCHC: 34.2 g/dL (ref 30.0–36.0)
MCV: 100.9 fL — AB (ref 78.0–100.0)
MONO ABS: 3.5 10*3/uL — AB (ref 0.1–1.0)
Monocytes Relative: 24 %
NEUTROS PCT: 67 %
Neutro Abs: 10 10*3/uL — ABNORMAL HIGH (ref 1.7–7.7)
Platelets: 222 10*3/uL (ref 150–400)
RBC: 5.3 MIL/uL — AB (ref 3.87–5.11)
RDW: 12.9 % (ref 11.5–15.5)
WBC: 14.8 10*3/uL — AB (ref 4.0–10.5)

## 2015-03-12 LAB — COMPREHENSIVE METABOLIC PANEL
ALBUMIN: 4.8 g/dL (ref 3.5–5.0)
ALK PHOS: 87 U/L (ref 38–126)
ALT: 21 U/L (ref 14–54)
AST: 42 U/L — ABNORMAL HIGH (ref 15–41)
BUN: 32 mg/dL — ABNORMAL HIGH (ref 6–20)
CALCIUM: 9.7 mg/dL (ref 8.9–10.3)
CO2: 18 mmol/L — ABNORMAL LOW (ref 22–32)
Chloride: 95 mmol/L — ABNORMAL LOW (ref 101–111)
Creatinine, Ser: 2.57 mg/dL — ABNORMAL HIGH (ref 0.44–1.00)
GFR, EST AFRICAN AMERICAN: 19 mL/min — AB (ref >60)
GFR, EST NON AFRICAN AMERICAN: 17 mL/min — AB (ref >60)
GLUCOSE: 248 mg/dL — AB (ref 65–99)
Potassium: 3.7 mmol/L (ref 3.5–5.1)
Sodium: 139 mmol/L (ref 135–145)
TOTAL PROTEIN: 8.9 g/dL — AB (ref 6.5–8.1)
Total Bilirubin: 1 mg/dL (ref 0.3–1.2)

## 2015-03-12 LAB — I-STAT CHEM 8, ED
BUN: 42 mg/dL — ABNORMAL HIGH (ref 6–20)
CALCIUM ION: 1.06 mmol/L — AB (ref 1.13–1.30)
Chloride: 100 mmol/L — ABNORMAL LOW (ref 101–111)
Creatinine, Ser: 2.2 mg/dL — ABNORMAL HIGH (ref 0.44–1.00)
Glucose, Bld: 235 mg/dL — ABNORMAL HIGH (ref 65–99)
HCT: 61 % — ABNORMAL HIGH (ref 36.0–46.0)
Hemoglobin: 20.7 g/dL — ABNORMAL HIGH (ref 12.0–15.0)
Potassium: 3.6 mmol/L (ref 3.5–5.1)
SODIUM: 138 mmol/L (ref 135–145)
TCO2: 19 mmol/L (ref 0–100)

## 2015-03-12 LAB — LACTIC ACID, PLASMA
Lactic Acid, Venous: 7 mmol/L (ref 0.5–2.0)
Lactic Acid, Venous: 4.9 mmol/L (ref 0.5–2.0)

## 2015-03-12 LAB — URINE MICROSCOPIC-ADD ON: SQUAMOUS EPITHELIAL / LPF: NONE SEEN

## 2015-03-12 LAB — I-STAT CG4 LACTIC ACID, ED: LACTIC ACID, VENOUS: 12.58 mmol/L — AB (ref 0.5–2.0)

## 2015-03-12 LAB — CBG MONITORING, ED: GLUCOSE-CAPILLARY: 202 mg/dL — AB (ref 65–99)

## 2015-03-12 LAB — TSH: TSH: 14.075 u[IU]/mL — AB (ref 0.350–4.500)

## 2015-03-12 MED ORDER — SODIUM CHLORIDE 0.9 % IV SOLN
1000.0000 mL | Freq: Once | INTRAVENOUS | Status: AC
  Administered 2015-03-12: 1000 mL via INTRAVENOUS

## 2015-03-12 MED ORDER — LORAZEPAM 2 MG/ML IJ SOLN
0.5000 mg | Freq: Two times a day (BID) | INTRAMUSCULAR | Status: DC | PRN
  Administered 2015-03-12 – 2015-03-19 (×10): 0.5 mg via INTRAVENOUS
  Filled 2015-03-12 (×12): qty 1

## 2015-03-12 MED ORDER — VANCOMYCIN HCL IN DEXTROSE 750-5 MG/150ML-% IV SOLN: 750.0000 mg | INTRAVENOUS | Status: DC

## 2015-03-12 MED ORDER — ONDANSETRON HCL 4 MG/2ML IJ SOLN
4.0000 mg | Freq: Four times a day (QID) | INTRAMUSCULAR | Status: DC | PRN
  Administered 2015-03-13 – 2015-03-14 (×2): 4 mg via INTRAVENOUS
  Filled 2015-03-12 (×2): qty 2

## 2015-03-12 MED ORDER — VANCOMYCIN HCL IN DEXTROSE 1-5 GM/200ML-% IV SOLN
1000.0000 mg | Freq: Once | INTRAVENOUS | Status: AC
  Administered 2015-03-12: 1000 mg via INTRAVENOUS
  Filled 2015-03-12: qty 200

## 2015-03-12 MED ORDER — DEXTROSE 5 % IV SOLN
1.0000 g | Freq: Three times a day (TID) | INTRAVENOUS | Status: DC
  Filled 2015-03-12 (×3): qty 1

## 2015-03-12 MED ORDER — ENOXAPARIN SODIUM 30 MG/0.3ML ~~LOC~~ SOLN
30.0000 mg | SUBCUTANEOUS | Status: DC
  Administered 2015-03-12: 30 mg via SUBCUTANEOUS
  Filled 2015-03-12: qty 0.3

## 2015-03-12 MED ORDER — DEXTROSE 5 % IV SOLN
2.0000 g | Freq: Once | INTRAVENOUS | Status: AC
  Administered 2015-03-12: 2 g via INTRAVENOUS
  Filled 2015-03-12: qty 2

## 2015-03-12 MED ORDER — SODIUM CHLORIDE 0.9 % IV SOLN
1000.0000 mL | INTRAVENOUS | Status: DC
  Administered 2015-03-12: 1000 mL via INTRAVENOUS

## 2015-03-12 MED ORDER — LEVOFLOXACIN IN D5W 250 MG/50ML IV SOLN
250.0000 mg | INTRAVENOUS | Status: DC
  Filled 2015-03-12: qty 50

## 2015-03-12 MED ORDER — LEVOFLOXACIN IN D5W 750 MG/150ML IV SOLN
750.0000 mg | Freq: Once | INTRAVENOUS | Status: AC
  Administered 2015-03-12: 750 mg via INTRAVENOUS
  Filled 2015-03-12: qty 150

## 2015-03-12 MED ORDER — SODIUM CHLORIDE 0.9 % IV BOLUS (SEPSIS): 1000.0000 mL | INTRAVENOUS | Status: DC

## 2015-03-12 MED ORDER — MORPHINE SULFATE (PF) 2 MG/ML IV SOLN
1.0000 mg | INTRAVENOUS | Status: DC | PRN
  Administered 2015-03-17 – 2015-03-19 (×6): 1 mg via INTRAVENOUS
  Filled 2015-03-12 (×7): qty 1

## 2015-03-12 MED ORDER — LEVOTHYROXINE SODIUM 100 MCG IV SOLR
37.5000 ug | Freq: Every day | INTRAVENOUS | Status: DC
  Administered 2015-03-12 – 2015-03-17 (×6): 37.5 ug via INTRAVENOUS
  Filled 2015-03-12 (×7): qty 5

## 2015-03-12 MED ORDER — SODIUM CHLORIDE 0.9 % IV SOLN
INTRAVENOUS | Status: DC
  Administered 2015-03-12 – 2015-03-14 (×4): via INTRAVENOUS

## 2015-03-12 MED ORDER — ONDANSETRON HCL 4 MG PO TABS
4.0000 mg | ORAL_TABLET | Freq: Four times a day (QID) | ORAL | Status: DC | PRN
Start: 2015-03-12 — End: 2015-03-20

## 2015-03-12 NOTE — Progress Notes (Signed)
**Note De-Identified  Obfuscation** RRT attempted ABG X2 unsuccessfully.  MD notified

## 2015-03-12 NOTE — Progress Notes (Signed)
ANTIBIOTIC CONSULT NOTE-Preliminary  Pharmacy Consult for Vancomycin, Levaquin, Aztreonam Indication: rule out sepsis  Allergies  Allergen Reactions  . Penicillins     Patient Measurements: Height: 5\' 2"  (157.5 cm) Weight: 118 lb (53.524 kg) IBW/kg (Calculated) : 50.1  Vital Signs: Temp: 99.2 F (37.3 C) (12/29 1027) Temp Source: Rectal (12/29 1027) BP: 112/73 mmHg (12/29 1330) Pulse Rate: 128 (12/29 1330)  Labs:  Recent Labs  03/12/15 1020 03/12/15 1029  WBC 14.8*  --   HGB 18.3* 20.7*  PLT 222  --   CREATININE 2.57* 2.20*    Estimated Creatinine Clearance: 16.1 mL/min (by C-G formula based on Cr of 2.2).  No results for input(s): VANCOTROUGH, VANCOPEAK, VANCORANDOM, GENTTROUGH, GENTPEAK, GENTRANDOM, TOBRATROUGH, TOBRAPEAK, TOBRARND, AMIKACINPEAK, AMIKACINTROU, AMIKACIN in the last 72 hours.   Microbiology: No results found for this or any previous visit (from the past 720 hour(s)).  Medical History: Past Medical History  Diagnosis Date  . Irritable bowel syndrome   . Thyroid condition   . Depression   . RA (rheumatoid arthritis) (HCC)   . Anxiety    Anti-infectives    Start     Dose/Rate Route Frequency Ordered Stop   03/12/15 1015  levofloxacin (LEVAQUIN) IVPB 750 mg     750 mg 100 mL/hr over 90 Minutes Intravenous  Once 03/12/15 1014 03/12/15 1151   03/12/15 1015  aztreonam (AZACTAM) 2 g in dextrose 5 % 50 mL IVPB     2 g 100 mL/hr over 30 Minutes Intravenous  Once 03/12/15 1014 03/12/15 1057   03/12/15 1015  vancomycin (VANCOCIN) IVPB 1000 mg/200 mL premix     1,000 mg 200 mL/hr over 60 Minutes Intravenous  Once 03/12/15 1014 03/12/15 1156     Assessment: 81yo female.  Initial doses of ABX given in ED for suspected sepsis.  SCr elevated.  Estimated Creatinine Clearance: 16.1 mL/min (by C-G formula based on Cr of 2.2).  Goal of Therapy:  Eradicate infection.  Plan:   Preliminary review of pertinent patient information completed.  Protocol  will be initiated with a one-time dose(s) of Vancomycin 1000mg , Aztreonam 2gm, Levaquin 750mg .   03/14/15 clinical pharmacist will complete review during morning rounds to assess patient and finalize treatment regimen.  A, RPH 03/12/2015,1:37 PM

## 2015-03-12 NOTE — ED Provider Notes (Signed)
CSN: [id number]     Arrival date & time 03/12/15  5456 History  By signing my name below, I, Ronney Lion, attest that this documentation has been prepared under the direction and in the presence of Marily Memos, MD. Electronically Signed: Ronney Lion, ED Scribe. 03/12/2015. 11:35 AM.   Chief Complaint  Patient presents with  . Weakness   Patient is a 81 y.o. female presenting with weakness. The history is provided by a relative and the patient. No language interpreter was used.  Weakness This is a new problem. The current episode started 2 days ago. The problem occurs constantly. The problem has been gradually worsening. Associated symptoms include shortness of breath. Pertinent negatives include no chest pain, no abdominal pain and no headaches. Nothing aggravates the symptoms. Nothing relieves the symptoms. She has tried nothing for the symptoms.   HPI Comments: Erica [name] is a 81 y.o. female with a history of thyroid condition, depression, and anxiety, who presents to the Emergency Department complaining of constant, progressively worsening generalized weakness that began 2 days ago. Associated symptoms include cough, SOB, vomiting, diarrhea, fatigue, and one syncopal episode that occurred last night while patient was already on the floor due to her weakness. Patient complains of feeling cold wile in the room. Her relative states she took a Xanax yesterday for anxiety but denies SI. Patient reports she had a pneumonia vaccine 2 weeks ago. Her relative states her heart rate and blood pressure are "always" high. Patient is on Cardizem for her blood pressure, which she last took 1 week ago before her ECT treatment. Patient denies any pain.   Past Medical History  Diagnosis Date  . Irritable bowel syndrome   . Thyroid condition   . Depression   . RA (rheumatoid arthritis) (HCC)   . Anxiety    Past Surgical History  Procedure Laterality Date  . Colostomy    . Abdominal hysterectomy    .  Colonoscopy    . Upper gastrointestinal endoscopy    . Eye surgery  cataract x 2  . Colonoscopy N/A 05/08/2012    Procedure: COLONOSCOPY;  Surgeon: Malissa Hippo, MD;  Location: [facility];  Service: Endoscopy;  Laterality: N/A;  730  . Bone spurs  08/07/13    Right Shoulder  . Esophagogastroduodenoscopy N/A 11/04/2013    Procedure: ESOPHAGOGASTRODUODENOSCOPY (EGD);  Surgeon: Malissa Hippo, MD;  Location: [facility];  Service: Endoscopy;  Laterality: N/A;  730  . Maloney dilation N/A 11/04/2013    Procedure: Elease Hashimoto DILATION;  Surgeon: Malissa Hippo, MD;  Location: [facility];  Service: Endoscopy;  Laterality: N/A;   Family History  Problem Relation Age of Onset  . Colon cancer Mother   . Bipolar disorder Mother   . Anxiety disorder Mother   . Atrial fibrillation Son   . Depression Maternal Grandmother    Social History  Substance Use Topics  . Smoking status: Former Smoker    Types: Cigarettes    Quit date: 04/18/1981  . Smokeless tobacco: Former Neurosurgeon    Quit date: 11/27/1980  . Alcohol Use: No   OB History    No data available     Review of Systems  Respiratory: Positive for shortness of breath.   Cardiovascular: Negative for chest pain.  Gastrointestinal: Negative for abdominal pain.  Neurological: Positive for weakness. Negative for headaches.   Allergies  Penicillins  Home Medications   Prior to Admission medications   Medication Sig Start Date End Date  Taking? Authorizing Provider  ARIPiprazole (ABILIFY) 2 MG tablet Take 1 mg by mouth daily.  08/13/14   Historical Provider, MD  clonazePAM (KLONOPIN) 0.5 MG tablet Take 0.5 mg by mouth. Patient states that she  Takes 1/2 of a tablet every morning.    Historical Provider, MD  Cyanocobalamin (VITAMIN B-12) 1000 MCG SUBL Place under the tongue daily.    Historical Provider, MD  diltiazem (CARDIZEM CD) 120 MG 24 hr capsule Take 120 mg by mouth daily.  05/29/14   Historical Provider, MD  DULoxetine (CYMBALTA)  20 MG capsule Take 40 mg by mouth daily.  01/22/15   Historical Provider, MD  folic acid (FOLVITE) 1 MG tablet Take 1 mg by mouth at bedtime.     Historical Provider, MD  gabapentin (NEURONTIN) 100 MG capsule Take 100 mg by mouth 3 (three) times daily.  01/26/15   Historical Provider, MD  levothyroxine (SYNTHROID) 75 MCG tablet Take 75 mcg by mouth daily.    Historical Provider, MD  loperamide (IMODIUM A-D) 2 MG tablet Take 0.5 tablets (1 mg total) by mouth 2 (two) times daily. Patient taking differently: Take 1 mg by mouth daily.  08/13/13   Malissa Hippo, MD  Methotrexate, PF, 10 MG/0.4ML SOAJ Inject 0.4 mLs into the skin once a week. saturday    Historical Provider, MD  ondansetron (ZOFRAN) 4 MG tablet Take 1 tablet (4 mg total) by mouth 2 (two) times daily as needed for nausea or vomiting. Patient states that she takes 1 by mouth in the morning, if needed she will take another one at lunch. Patient not taking: Reported on 10/07/2014 06/23/14   Malissa Hippo, MD  pantoprazole (PROTONIX) 40 MG tablet TAKE ONE TABLET BY MOUTH DAILY BEFORE BREAKFAST. 05/23/14   Malissa Hippo, MD   There were no vitals taken for this visit. Physical Exam  Constitutional: She is oriented to person, place, and time. She appears well-developed and well-nourished. No distress.  HENT:  Head: Normocephalic and atraumatic.  Eyes: Conjunctivae and EOM are normal.  Neck: Neck supple. No tracheal deviation present.  Cardiovascular: Tachycardia present.   Tachycardia.  Pulmonary/Chest: Tachypnea noted. She is in respiratory distress. She has rhonchi.  Rhonchi to left lower lobe. Mild respiratory distress with tachypnea.   Abdominal: She exhibits distension. There is no tenderness. There is no rebound and no guarding.  Abdomen mildly distended with no tenderness, rebound, or guarding.   Musculoskeletal: Normal range of motion.  Mottling in extremities.   Neurological: She is alert and oriented to person, place, and  time.  Skin: Skin is warm and dry.  Psychiatric: She has a normal mood and affect. Her behavior is normal.  Nursing note and vitals reviewed.   ED Course  Procedures (including critical care time)  CRITICAL CARE Performed by: Marily Memos   Total critical care time: 60 minutes  Critical care time was exclusive of separately billable procedures and treating other patients.  Critical care was necessary to treat or prevent imminent or life-threatening deterioration.  Critical care was time spent personally by me on the following activities: development of treatment plan with patient and/or surrogate as well as nursing, discussions with consultants, evaluation of patient's response to treatment, examination of patient, obtaining history from patient or surrogate, ordering and performing treatments and interventions, ordering and review of laboratory studies, ordering and review of radiographic studies, pulse oximetry and re-evaluation of patient's condition.   DIAGNOSTIC STUDIES: Oxygen Saturation is 98% on room air, normally by  my interpretation.    COORDINATION OF CARE: 10:12 AM - Discussed treatment plan with pt and family at bedside which includes diagnostic testing. Pt and family verbalized understanding and agreed to plan.   Labs Review Labs Reviewed  CBC WITH DIFFERENTIAL/PLATELET - Abnormal; Notable for the following:    WBC 14.8 (*)    RBC 5.30 (*)    Hemoglobin 18.3 (*)    HCT 53.5 (*)    MCV 100.9 (*)    MCH 34.5 (*)    Neutro Abs 10.0 (*)    Monocytes Absolute 3.5 (*)    All other components within normal limits  COMPREHENSIVE METABOLIC PANEL - Abnormal; Notable for the following:    Chloride 95 (*)    CO2 18 (*)    Glucose, Bld 248 (*)    BUN 32 (*)    Creatinine, Ser 2.57 (*)    Total Protein 8.9 (*)    AST 42 (*)    GFR calc non Af Amer 17 (*)    GFR calc Af Amer 19 (*)    All other components within normal limits  I-STAT CG4 LACTIC ACID, ED - Abnormal;  Notable for the following:    Lactic Acid, Venous 12.58 (*)    All other components within normal limits  CBG MONITORING, ED - Abnormal; Notable for the following:    Glucose-Capillary 202 (*)    All other components within normal limits  I-STAT CHEM 8, ED - Abnormal; Notable for the following:    Chloride 100 (*)    BUN 42 (*)    Creatinine, Ser 2.20 (*)    Glucose, Bld 235 (*)    Calcium, Ion 1.06 (*)    Hemoglobin 20.7 (*)    HCT 61.0 (*)    All other components within normal limits  CULTURE, BLOOD (ROUTINE X 2)  CULTURE, BLOOD (ROUTINE X 2)  URINE CULTURE  BLOOD GAS, ARTERIAL  URINALYSIS, ROUTINE W REFLEX MICROSCOPIC (NOT AT [facility])  TSH  I-STAT ARTERIAL BLOOD GAS, ED    Imaging Review Dg Chest Port 1 View  03/12/2015  CLINICAL DATA:  Shortness of breath for 2 days. EXAM: PORTABLE CHEST 1 VIEW COMPARISON:  04/25/2006. FINDINGS: The cardiac silhouette, mediastinal and hilar contours are within normal limits and stable. Mild tortuosity and calcification of the thoracic aorta. The lungs are clear. Suspect emphysematous changes. No pleural effusion. The bony thorax is intact. IMPRESSION: No acute cardiopulmonary findings. Electronically Signed   By: Rudie Meyer M.D.   On: 03/12/2015 11:10   I have personally reviewed and evaluated these images and lab results as part of my medical decision-making.   EKG Interpretation   Date/Time:  Thursday March 12 2015 10:23:44 EST Ventricular Rate:  144 PR Interval:  107 QRS Duration: 108 QT Interval:  335 QTC Calculation: 518 R Axis:   80 Text Interpretation:  Supraventricular tachycardia Ventricular premature  complex Borderline ST depression, inferior leads Nonspecific T  abnormalities, diffuse leads Confirmed by [facility] MD, Barbara Cower [phone]) on  03/12/2015 10:42:25 AM      MDM   Final diagnoses:  Abdominal distension  Partial small bowel obstruction (HCC)  Other specified hypotension  Lactic acidosis  Hypovolemic shock  (HCC)  Acute renal failure, unspecified acute renal failure type (HCC)  Elevated troponin    81 yo F in ED with shock, mottled skin, decreased MS, abdominal distension after a couple days of vomiting and one episode of diarrhea. Initially thought to e pnemonia, however clear cxr and  lactic acid that came back at 12 made me more worried about mesenteric ischemia or other bowel pathology. Fluid resuscitation began immediately with 30 cc/kg, vanc/levaquin/cefpime given for sepsis of unknown source. HR and BP improved after fluid administration. MS improved, perfusion improved. CT abdomen shwoed likely ileus v partial small bowel obstruction. D/w surgery who recommended medicine admission. Admitted to ICU.     I personally performed the services described in this documentation, which was scribed in my presence. The recorded information has been reviewed and is accurate.    Marily Memos, MD 03/12/15 [phone]

## 2015-03-12 NOTE — ED Notes (Signed)
Notified respiratory to draw abg.

## 2015-03-12 NOTE — ED Notes (Signed)
PT reports n/v/d x2 days. PT unable to bear weight on her legs and shaking on arrival to ED. PT family reports syncopal episode this am. Family also reports d/c of all depression medications x2 weeks. PT states SOB and cold.

## 2015-03-12 NOTE — ED Notes (Signed)
Attempted NG tube in both nares, not able to advance.  Pt refused any more trials for NG tube placement.

## 2015-03-12 NOTE — ED Notes (Signed)
resp at bedside

## 2015-03-12 NOTE — ED Notes (Signed)
Lab only able to get one set of blood cultures, Dr. Clayborne Dana notified.

## 2015-03-12 NOTE — ED Notes (Signed)
CRITICAL VALUE ALERT  Critical value received: Lactic acid 7.0 Date of notification: 03/12/15  Time of notification:1323  Critical value read back:Yes.    Nurse who received alert:  Gertha Calkin  MD notified (1st page): Dr. Clayborne Dana

## 2015-03-12 NOTE — ED Notes (Signed)
Dr. Clayborne Dana notified that respiratory was unable to get blood gas drawn for pt.

## 2015-03-12 NOTE — H&P (Signed)
[facility]          History and Physical    PCP:   Asencion Noble, MD   EDP: Merrily Pew, M.D.  Chief Complaint:  Weakness  HPI: Patient is a pleasant 81 year old woman with history of depression, hypothyroidism, rheumatoid arthritis who presents to the emergency department for 2 days worth of increased weakness and nausea vomiting. She states that on Tuesday afternoon she ate a chicken salad sandwich and ever since then has been vomiting constantly. Today she became so weak that she was unable to stand up from the floor and hence EMS was called to her house. Upon arrival to the emergency department she was found to have a blood pressure of 60/40, was found to be cyanotic,creatinine was found to be 2.57, leukocytosis of 14.8. She was found to have a lactic acid of 12.5, hemoglobin of 20.7. She was started on sepsis protocol and given doses of vancomycin, aztreonam and Levaquin and was placed on aggressive IV fluid hydration. Her blood pressure subsequently improved. We were asked to admit her for further evaluation and management.  Allergies:   Allergies  Allergen Reactions  . Penicillins       Past Medical History  Diagnosis Date  . Irritable bowel syndrome   . Thyroid condition   . Depression   . RA (rheumatoid arthritis) ([place])   . Anxiety     Past Surgical History  Procedure Laterality Date  . Colostomy    . Abdominal hysterectomy    . Colonoscopy    . Upper gastrointestinal endoscopy    . Eye surgery  cataract x 2  . Colonoscopy N/A 05/08/2012    Procedure: COLONOSCOPY;  Surgeon: Rogene Houston, MD;  Location: [facility];  Service: Endoscopy;  Laterality: N/A;  730  . Bone spurs  08/07/13    Right Shoulder  . Esophagogastroduodenoscopy N/A 11/04/2013    Procedure: ESOPHAGOGASTRODUODENOSCOPY (EGD);  Surgeon: Rogene Houston, MD;  Location: [facility];  Service: Endoscopy;  Laterality: N/A;  730  . Maloney dilation N/A 11/04/2013   Procedure: Venia Minks DILATION;  Surgeon: Rogene Houston, MD;  Location: [facility];  Service: Endoscopy;  Laterality: N/A;    Prior to Admission medications   Medication Sig Start Date End Date Taking? Authorizing Provider  ALPRAZolam Duanne Moron) 0.25 MG tablet Take 0.25 mg by mouth daily as needed for anxiety.   Yes Historical Provider, MD  folic acid (FOLVITE) 1 MG tablet Take 1 mg by mouth at bedtime.    Yes Historical Provider, MD  levothyroxine (SYNTHROID) 75 MCG tablet Take 75 mcg by mouth daily.   Yes Historical Provider, MD  loperamide (IMODIUM A-D) 2 MG tablet Take 0.5 tablets (1 mg total) by mouth 2 (two) times daily. Patient taking differently: Take 1 mg by mouth daily as needed for diarrhea or loose stools.  08/13/13  Yes Rogene Houston, MD  Methotrexate, PF, 10 MG/0.4ML SOAJ Inject 0.4 mLs into the skin once a week. saturday   Yes Historical Provider, MD  pantoprazole (PROTONIX) 40 MG tablet TAKE ONE TABLET BY MOUTH DAILY BEFORE BREAKFAST. 05/23/14  Yes Rogene Houston, MD    Social History:  reports that she quit smoking about 33 years ago. Her smoking use included Cigarettes. She quit smokeless tobacco use about 34 years ago. She reports that she does not drink alcohol or use illicit drugs.  Family History  Problem Relation  Age of Onset  . Colon cancer Mother   . Bipolar disorder Mother   . Anxiety disorder Mother   . Atrial fibrillation Son   . Depression Maternal Grandmother     Review of Systems:  Constitutional: Denies fever, chills, diaphoresis, appetite change and fatigue.  HEENT: Denies photophobia, eye pain, redness, hearing loss, ear pain, congestion, sore throat, rhinorrhea, sneezing, mouth sores, trouble swallowing, neck pain, neck stiffness and tinnitus.   Respiratory: Denies SOB, DOE, cough, chest tightness,  and wheezing.   Cardiovascular: Denies chest pain, palpitations and leg swelling.  Gastrointestinal: Denies  diarrhea, constipation, blood in stool and  abdominal distention.  Genitourinary: Denies dysuria, urgency, frequency, hematuria, flank pain and difficulty urinating.  Endocrine: Denies: hot or cold intolerance, sweats, changes in hair or nails, polyuria, polydipsia. Musculoskeletal: Denies myalgias, back pain, joint swelling, arthralgias and gait problem.  Skin: Denies pallor, rash and wound.  Neurological: Denies dizziness, seizures, syncope, weakness, light-headedness, numbness and headaches.  Hematological: Denies adenopathy. Easy bruising, personal or family bleeding history  Psychiatric/Behavioral: Denies suicidal ideation, mood changes, confusion, nervousness, sleep disturbance and agitation   Physical Exam: Blood pressure 141/81, pulse 117, temperature 97.7 F (36.5 C), temperature source Oral, resp. rate 14, height '5\' 3"'  (1.6 m), weight 55.1 kg (121 lb 7.6 oz), SpO2 95 %. General: Alert, awake, oriented 3 HEENT: Normocephalic, atraumatic, pupils equal and reactive to light, extraocular movements intact, very dry mucous membranes with cracked lips and tongue. Neck: Supple, no JVD, no lymphadenopathy, no bruits, no goiter. Cardiovascular: Tachycardic, regular, no murmurs, rubs or gallops identified on exam Lungs: Clear to auscultation bilaterally Abdomen: Soft, slightly distended, positive bowel sounds, nontender to palpation Extremities: No clubbing, cyanosis or edema, positive pulses Neurologic: Grossly intact and nonfocal  Labs on Admission:  Results for orders placed or performed during the hospital encounter of 03/12/15 (from the past 48 hour(s))  CBG monitoring, ED     Status: Abnormal   Collection Time: 03/12/15 10:15 AM  Result Value Ref Range   Glucose-Capillary 202 (H) 65 - 99 mg/dL  CBC with Differential     Status: Abnormal   Collection Time: 03/12/15 10:20 AM  Result Value Ref Range   WBC 14.8 (H) 4.0 - 10.5 K/uL   RBC 5.30 (H) 3.87 - 5.11 MIL/uL   Hemoglobin 18.3 (H) 12.0 - 15.0 g/dL   HCT 53.5 (H) 36.0 -  46.0 %   MCV 100.9 (H) 78.0 - 100.0 fL   MCH 34.5 (H) 26.0 - 34.0 pg   MCHC 34.2 30.0 - 36.0 g/dL   RDW 12.9 11.5 - 15.5 %   Platelets 222 150 - 400 K/uL   Neutrophils Relative % 67 %   Neutro Abs 10.0 (H) 1.7 - 7.7 K/uL   Lymphocytes Relative 9 %   Lymphs Abs 1.3 0.7 - 4.0 K/uL   Monocytes Relative 24 %   Monocytes Absolute 3.5 (H) 0.1 - 1.0 K/uL   Eosinophils Relative 0 %   Eosinophils Absolute 0.0 0.0 - 0.7 K/uL   Basophils Relative 0 %   Basophils Absolute 0.0 0.0 - 0.1 K/uL  Comprehensive metabolic panel     Status: Abnormal   Collection Time: 03/12/15 10:20 AM  Result Value Ref Range   Sodium 139 135 - 145 mmol/L   Potassium 3.7 3.5 - 5.1 mmol/L   Chloride 95 (L) 101 - 111 mmol/L   CO2 18 (L) 22 - 32 mmol/L   Glucose, Bld 248 (H) 65 - 99 mg/dL  BUN 32 (H) 6 - 20 mg/dL   Creatinine, Ser 2.57 (H) 0.44 - 1.00 mg/dL   Calcium 9.7 8.9 - 10.3 mg/dL   Total Protein 8.9 (H) 6.5 - 8.1 g/dL   Albumin 4.8 3.5 - 5.0 g/dL   AST 42 (H) 15 - 41 U/L   ALT 21 14 - 54 U/L   Alkaline Phosphatase 87 38 - 126 U/L   Total Bilirubin 1.0 0.3 - 1.2 mg/dL   GFR calc non Af Amer 17 (L) >60 mL/min   GFR calc Af Amer 19 (L) >60 mL/min    Comment: (NOTE) The eGFR has been calculated using the CKD EPI equation. This calculation has not been validated in all clinical situations. eGFR's persistently <60 mL/min signify possible Chronic Kidney Disease.   I-Stat CG4 Lactic Acid, ED  (not at  [facility])     Status: Abnormal   Collection Time: 03/12/15 10:22 AM  Result Value Ref Range   Lactic Acid, Venous 12.58 (HH) 0.5 - 2.0 mmol/L   Comment NOTIFIED PHYSICIAN   I-stat chem 8, ed     Status: Abnormal   Collection Time: 03/12/15 10:29 AM  Result Value Ref Range   Sodium 138 135 - 145 mmol/L   Potassium 3.6 3.5 - 5.1 mmol/L   Chloride 100 (L) 101 - 111 mmol/L   BUN 42 (H) 6 - 20 mg/dL   Creatinine, Ser 2.20 (H) 0.44 - 1.00 mg/dL   Glucose, Bld 235 (H) 65 - 99 mg/dL   Calcium, Ion 1.06 (L) 1.13 -  1.30 mmol/L   TCO2 19 0 - 100 mmol/L   Hemoglobin 20.7 (H) 12.0 - 15.0 g/dL   HCT 61.0 (H) 36.0 - 46.0 %  Blood Culture (routine x 2)     Status: None (Preliminary result)   Collection Time: 03/12/15 10:41 AM  Result Value Ref Range   Specimen Description BLOOD RIGHT HAND    Special Requests BOTTLES DRAWN AEROBIC ONLY 4CC    Culture NO GROWTH < 12 HOURS    Report Status PENDING   TSH     Status: Abnormal   Collection Time: 03/12/15 10:41 AM  Result Value Ref Range   TSH 14.075 (H) 0.350 - 4.500 uIU/mL  Blood Culture (routine x 2)     Status: None (Preliminary result)   Collection Time: 03/12/15 11:45 AM  Result Value Ref Range   Specimen Description BLOOD LEFT HAND    Special Requests BOTTLES DRAWN AEROBIC ONLY 4CC    Culture NO GROWTH < 12 HOURS    Report Status PENDING   Urinalysis, Routine w reflex microscopic (not at [facility])     Status: Abnormal   Collection Time: 03/12/15 12:20 PM  Result Value Ref Range   Color, Urine YELLOW YELLOW   APPearance CLEAR CLEAR   Specific Gravity, Urine 1.025 1.005 - 1.030   pH 5.5 5.0 - 8.0   Glucose, UA NEGATIVE NEGATIVE mg/dL   Hgb urine dipstick SMALL (A) NEGATIVE   Bilirubin Urine NEGATIVE NEGATIVE   Ketones, ur NEGATIVE NEGATIVE mg/dL   Protein, ur 30 (A) NEGATIVE mg/dL   Nitrite NEGATIVE NEGATIVE   Leukocytes, UA NEGATIVE NEGATIVE  Urine microscopic-add on     Status: Abnormal   Collection Time: 03/12/15 12:20 PM  Result Value Ref Range   Squamous Epithelial / LPF NONE SEEN NONE SEEN   WBC, UA 0-5 0 - 5 WBC/hpf   RBC / HPF 0-5 0 - 5 RBC/hpf   Bacteria, UA MANY (A) NONE  SEEN  Lactic acid, plasma     Status: Abnormal   Collection Time: 03/12/15 12:45 PM  Result Value Ref Range   Lactic Acid, Venous 7.0 (HH) 0.5 - 2.0 mmol/L    Comment: CRITICAL RESULT CALLED TO, READ BACK BY AND VERIFIED WITH: MITCHELL,M AT 1:25PM ON 03/12/15 BY FESTERMAN,C   Lactic acid, plasma     Status: Abnormal   Collection Time: 03/12/15  3:39 PM  Result  Value Ref Range   Lactic Acid, Venous 4.9 (HH) 0.5 - 2.0 mmol/L    Comment: CRITICAL RESULT CALLED TO, READ BACK BY AND VERIFIED WITH: ROWE,N ON 03/12/15 AT 1620 BY LOY,C     Radiological Exams on Admission: Ct Chest Wo Contrast  03/12/2015  ADDENDUM REPORT: 03/12/2015 12:27 ADDENDUM: The impression should read: Small bowel dilatation consistent with partial small bowel obstruction secondary to adhesions. No definitive mass lesion is noted. Electronically Signed   By: Inez Catalina M.D.   On: 03/12/2015 12:27  03/12/2015  CLINICAL DATA:  Shortness of breath and abdominal pain EXAM: CT CHEST, ABDOMEN AND PELVIS WITHOUT CONTRAST TECHNIQUE: Multidetector CT imaging of the chest, abdomen and pelvis was performed following the standard protocol without IV contrast. COMPARISON:  05/10/2006 FINDINGS: CT CHEST Lungs are well aerated bilaterally. No focal confluent infiltrate or sizable effusion is noted. A ground-glass nodule is noted in the left lower lobe best seen on image number 27 of series 6. This is stable from the prior exam. No other focal nodules are seen. The thoracic aorta show some calcifications without aneurysmal dilatation. No hilar or mediastinal adenopathy is noted. Mild fluid is noted in the distal esophagus which may be related to reflux. Small hiatal hernia is noted. No acute bony abnormality is seen. CT ABDOMEN AND PELVIS The liver, gallbladder, spleen, adrenal glands and pancreas are within normal limits. The kidneys are well visualized bilaterally without evidence of renal calculi or urinary tract obstructive changes. Diffuse small bowel dilatation is noted with a focal area of relatively smooth transition in the anterior abdomen best seen on image number 82 of series 2. There postsurgical changes in the anterior abdominal wall on these changes are likely related to adhesions as no definitive mass lesion is seen. More distal small bowel is within normal limits. Mild diverticular change of  the colon as well as changes of prior colectomy are seen. No diverticulitis is noted. The appendix is not well seen although no inflammatory changes are noted. The bladder is well distended. The uterus has been surgically removed. Degenerative changes of lumbar spine are noted. IMPRESSION: Ground-glass nodule in the left lower lobe stable from the prior exam and likely benign in etiology. Small bowel dilatation likely related to adhesions. No definitive mass lesion is seen. No other acute abnormality is noted. Electronically Signed: By: Inez Catalina M.D. On: 03/12/2015 12:17   Dg Chest Port 1 View  03/12/2015  CLINICAL DATA:  Shortness of breath for 2 days. EXAM: PORTABLE CHEST 1 VIEW COMPARISON:  04/25/2006. FINDINGS: The cardiac silhouette, mediastinal and hilar contours are within normal limits and stable. Mild tortuosity and calcification of the thoracic aorta. The lungs are clear. Suspect emphysematous changes. No pleural effusion. The bony thorax is intact. IMPRESSION: No acute cardiopulmonary findings. Electronically Signed   By: Marijo Sanes M.D.   On: 03/12/2015 11:10   Ct Renal Stone Study  03/12/2015  ADDENDUM REPORT: 03/12/2015 12:27 ADDENDUM: The impression should read: Small bowel dilatation consistent with partial small bowel obstruction secondary to adhesions.  No definitive mass lesion is noted. Electronically Signed   By: Inez Catalina M.D.   On: 03/12/2015 12:27  03/12/2015  CLINICAL DATA:  Shortness of breath and abdominal pain EXAM: CT CHEST, ABDOMEN AND PELVIS WITHOUT CONTRAST TECHNIQUE: Multidetector CT imaging of the chest, abdomen and pelvis was performed following the standard protocol without IV contrast. COMPARISON:  05/10/2006 FINDINGS: CT CHEST Lungs are well aerated bilaterally. No focal confluent infiltrate or sizable effusion is noted. A ground-glass nodule is noted in the left lower lobe best seen on image number 27 of series 6. This is stable from the prior exam. No other  focal nodules are seen. The thoracic aorta show some calcifications without aneurysmal dilatation. No hilar or mediastinal adenopathy is noted. Mild fluid is noted in the distal esophagus which may be related to reflux. Small hiatal hernia is noted. No acute bony abnormality is seen. CT ABDOMEN AND PELVIS The liver, gallbladder, spleen, adrenal glands and pancreas are within normal limits. The kidneys are well visualized bilaterally without evidence of renal calculi or urinary tract obstructive changes. Diffuse small bowel dilatation is noted with a focal area of relatively smooth transition in the anterior abdomen best seen on image number 82 of series 2. There postsurgical changes in the anterior abdominal wall on these changes are likely related to adhesions as no definitive mass lesion is seen. More distal small bowel is within normal limits. Mild diverticular change of the colon as well as changes of prior colectomy are seen. No diverticulitis is noted. The appendix is not well seen although no inflammatory changes are noted. The bladder is well distended. The uterus has been surgically removed. Degenerative changes of lumbar spine are noted. IMPRESSION: Ground-glass nodule in the left lower lobe stable from the prior exam and likely benign in etiology. Small bowel dilatation likely related to adhesions. No definitive mass lesion is seen. No other acute abnormality is noted. Electronically Signed: By: Inez Catalina M.D. On: 03/12/2015 12:17    Assessment/Plan Principal Problem:   Partial small bowel obstruction (HCC) Active Problems:   Depression   Hypotension   Lactic acidosis   Dehydration   SBO (small bowel obstruction) (HCC)    Partial small bowel obstruction -This is likely the precipitating cause of her acute illness with nausea and vomiting; unable to fully discard food poisoning or viral gastroenteritis at present. -CT scan does demonstrate small bowel dilatation consistent with partial  small bowel obstruction secondary to adhesions with no definitive mass lesions.  -Will keep nothing by mouth, treat symptomatically with pain and nausea medications as needed. -We'll check abdominal film in a.m. -Surgery, Dr. Arnoldo Morale, is aware of patient's admission.  Hypotensive/lactic acidosis/dehydration -Do not believe she is septic at present, will discontinue all antibiotics. -Suspect she has massive intravascular volume contraction from profound GI losses as evidenced by hemoconcentration, lactic acidosis, hypotension and acute renal failure.  -Lactic acidosis is already decreased from 12.5 to 4 with IV fluids. Blood pressure has normalized. -Continue normal saline at 150 mL an hour.  Depression -Currently undergoing electroconvulsive therapy at [facility], will need follow-up with outpatient psychiatrist upon discharge.  DVT prophylaxis -Lovenox  CODE STATUS -Full code as discussed with patient and daughter-in-law at bedside.    Dr. Willey Blade to assume care in a.m.   Time Spent on Admission: 95 minutes  HERNANDEZ ACOSTA,ESTELA [facility] Pager: [phone] 03/12/2015, 4:33 PM

## 2015-03-12 NOTE — ED Notes (Signed)
MD at bedside, pt on NRB.

## 2015-03-13 LAB — CBC
HCT: 41.4 % (ref 36.0–46.0)
Hemoglobin: 13.8 g/dL (ref 12.0–15.0)
MCH: 33.3 pg (ref 26.0–34.0)
MCHC: 33.3 g/dL (ref 30.0–36.0)
MCV: 99.8 fL (ref 78.0–100.0)
PLATELETS: 145 10*3/uL — AB (ref 150–400)
RBC: 4.15 MIL/uL (ref 3.87–5.11)
RDW: 12.9 % (ref 11.5–15.5)
WBC: 7.8 10*3/uL (ref 4.0–10.5)

## 2015-03-13 LAB — BASIC METABOLIC PANEL
Anion gap: 6 (ref 5–15)
BUN: 16 mg/dL (ref 6–20)
CHLORIDE: 114 mmol/L — AB (ref 101–111)
CO2: 21 mmol/L — ABNORMAL LOW (ref 22–32)
CREATININE: 0.78 mg/dL (ref 0.44–1.00)
Calcium: 7.8 mg/dL — ABNORMAL LOW (ref 8.9–10.3)
Glucose, Bld: 92 mg/dL (ref 65–99)
Potassium: 3.3 mmol/L — ABNORMAL LOW (ref 3.5–5.1)
SODIUM: 141 mmol/L (ref 135–145)

## 2015-03-13 LAB — TSH: TSH: 1.695 u[IU]/mL (ref 0.350–4.500)

## 2015-03-13 LAB — URINE CULTURE: CULTURE: NO GROWTH

## 2015-03-13 LAB — MRSA PCR SCREENING: MRSA BY PCR: NEGATIVE

## 2015-03-13 MED ORDER — DILTIAZEM HCL 100 MG IV SOLR
5.0000 mg/h | INTRAVENOUS | Status: DC
  Administered 2015-03-13: 5 mg/h via INTRAVENOUS
  Filled 2015-03-13: qty 100

## 2015-03-13 MED ORDER — ENOXAPARIN SODIUM 40 MG/0.4ML ~~LOC~~ SOLN
40.0000 mg | SUBCUTANEOUS | Status: DC
  Administered 2015-03-13 – 2015-03-19 (×7): 40 mg via SUBCUTANEOUS
  Filled 2015-03-13 (×7): qty 0.4

## 2015-03-13 MED ORDER — POTASSIUM CHLORIDE 10 MEQ/100ML IV SOLN
10.0000 meq | INTRAVENOUS | Status: AC
  Administered 2015-03-13 (×4): 10 meq via INTRAVENOUS
  Filled 2015-03-13: qty 100

## 2015-03-13 NOTE — Consult Note (Signed)
Reason for Consult: Bowel distention Referring Physician: Dr. Asencion Noble  Erica [name] is an 81 y.o. female.  HPI: Patient is an 81 year old white female who presented emergency room in extremis with hypotension, nausea, and vomiting. She states she had eaten 24 hours earlier and began experiencing nausea and vomiting. Last bowel movement was liquidy and occurred the morning that she presented emergency room. While the emergency room, she was noted to be severely dehydrated, hypotensive, with a lactic acid level greater than 12. She was resuscitated. CT scan the abdomen revealed bowel dilatation. There was some suggestion that this may have been secondary to adhesive disease. She has had abdominal surgery in the remote past. She was admitted to the intensive care unit for further evaluation treatment. Her vital signs have significantly improved. This morning when I saw her, she had no abdominal pain. She had no nausea. She was thirsty.  Past Medical History  Diagnosis Date  . Irritable bowel syndrome   . Thyroid condition   . Depression   . RA (rheumatoid arthritis) ([place])   . Anxiety     Past Surgical History  Procedure Laterality Date  . Colostomy    . Abdominal hysterectomy    . Colonoscopy    . Upper gastrointestinal endoscopy    . Eye surgery  cataract x 2  . Colonoscopy N/A 05/08/2012    Procedure: COLONOSCOPY;  Surgeon: Rogene Houston, MD;  Location: [facility];  Service: Endoscopy;  Laterality: N/A;  730  . Bone spurs  08/07/13    Right Shoulder  . Esophagogastroduodenoscopy N/A 11/04/2013    Procedure: ESOPHAGOGASTRODUODENOSCOPY (EGD);  Surgeon: Rogene Houston, MD;  Location: [facility];  Service: Endoscopy;  Laterality: N/A;  730  . Maloney dilation N/A 11/04/2013    Procedure: Venia Minks DILATION;  Surgeon: Rogene Houston, MD;  Location: [facility];  Service: Endoscopy;  Laterality: N/A;    Family History  Problem Relation Age of Onset  . Colon cancer Mother   .  Bipolar disorder Mother   . Anxiety disorder Mother   . Atrial fibrillation Son   . Depression Maternal Grandmother     Social History:  reports that she quit smoking about 33 years ago. Her smoking use included Cigarettes. She quit smokeless tobacco use about 34 years ago. She reports that she does not drink alcohol or use illicit drugs.  Allergies:  Allergies  Allergen Reactions  . Penicillins     Medications: I have reviewed the patient's current medications.  Results for orders placed or performed during the hospital encounter of 03/12/15 (from the past 48 hour(s))  CBG monitoring, ED     Status: Abnormal   Collection Time: 03/12/15 10:15 AM  Result Value Ref Range   Glucose-Capillary 202 (H) 65 - 99 mg/dL  CBC with Differential     Status: Abnormal   Collection Time: 03/12/15 10:20 AM  Result Value Ref Range   WBC 14.8 (H) 4.0 - 10.5 K/uL   RBC 5.30 (H) 3.87 - 5.11 MIL/uL   Hemoglobin 18.3 (H) 12.0 - 15.0 g/dL   HCT 53.5 (H) 36.0 - 46.0 %   MCV 100.9 (H) 78.0 - 100.0 fL   MCH 34.5 (H) 26.0 - 34.0 pg   MCHC 34.2 30.0 - 36.0 g/dL   RDW 12.9 11.5 - 15.5 %   Platelets 222 150 - 400 K/uL   Neutrophils Relative % 67 %   Neutro Abs 10.0 (H) 1.7 - 7.7 K/uL  Lymphocytes Relative 9 %   Lymphs Abs 1.3 0.7 - 4.0 K/uL   Monocytes Relative 24 %   Monocytes Absolute 3.5 (H) 0.1 - 1.0 K/uL   Eosinophils Relative 0 %   Eosinophils Absolute 0.0 0.0 - 0.7 K/uL   Basophils Relative 0 %   Basophils Absolute 0.0 0.0 - 0.1 K/uL  Comprehensive metabolic panel     Status: Abnormal   Collection Time: 03/12/15 10:20 AM  Result Value Ref Range   Sodium 139 135 - 145 mmol/L   Potassium 3.7 3.5 - 5.1 mmol/L   Chloride 95 (L) 101 - 111 mmol/L   CO2 18 (L) 22 - 32 mmol/L   Glucose, Bld 248 (H) 65 - 99 mg/dL   BUN 32 (H) 6 - 20 mg/dL   Creatinine, Ser 2.57 (H) 0.44 - 1.00 mg/dL   Calcium 9.7 8.9 - 10.3 mg/dL   Total Protein 8.9 (H) 6.5 - 8.1 g/dL   Albumin 4.8 3.5 - 5.0 g/dL   AST 42 (H)  15 - 41 U/L   ALT 21 14 - 54 U/L   Alkaline Phosphatase 87 38 - 126 U/L   Total Bilirubin 1.0 0.3 - 1.2 mg/dL   GFR calc non Af Amer 17 (L) >60 mL/min   GFR calc Af Amer 19 (L) >60 mL/min    Comment: (NOTE) The eGFR has been calculated using the CKD EPI equation. This calculation has not been validated in all clinical situations. eGFR's persistently <60 mL/min signify possible Chronic Kidney Disease.   I-Stat CG4 Lactic Acid, ED  (not at  [facility])     Status: Abnormal   Collection Time: 03/12/15 10:22 AM  Result Value Ref Range   Lactic Acid, Venous 12.58 (HH) 0.5 - 2.0 mmol/L   Comment NOTIFIED PHYSICIAN   I-stat chem 8, ed     Status: Abnormal   Collection Time: 03/12/15 10:29 AM  Result Value Ref Range   Sodium 138 135 - 145 mmol/L   Potassium 3.6 3.5 - 5.1 mmol/L   Chloride 100 (L) 101 - 111 mmol/L   BUN 42 (H) 6 - 20 mg/dL   Creatinine, Ser 2.20 (H) 0.44 - 1.00 mg/dL   Glucose, Bld 235 (H) 65 - 99 mg/dL   Calcium, Ion 1.06 (L) 1.13 - 1.30 mmol/L   TCO2 19 0 - 100 mmol/L   Hemoglobin 20.7 (H) 12.0 - 15.0 g/dL   HCT 61.0 (H) 36.0 - 46.0 %  Blood Culture (routine x 2)     Status: None (Preliminary result)   Collection Time: 03/12/15 10:41 AM  Result Value Ref Range   Specimen Description BLOOD RIGHT HAND    Special Requests BOTTLES DRAWN AEROBIC ONLY 4CC    Culture NO GROWTH < 24 HOURS    Report Status PENDING   TSH     Status: Abnormal   Collection Time: 03/12/15 10:41 AM  Result Value Ref Range   TSH 14.075 (H) 0.350 - 4.500 uIU/mL  Blood Culture (routine x 2)     Status: None (Preliminary result)   Collection Time: 03/12/15 11:45 AM  Result Value Ref Range   Specimen Description BLOOD LEFT HAND    Special Requests BOTTLES DRAWN AEROBIC ONLY 4CC    Culture NO GROWTH < 24 HOURS    Report Status PENDING   Urinalysis, Routine w reflex microscopic (not at [facility])     Status: Abnormal   Collection Time: 03/12/15 12:20 PM  Result Value Ref Range   Color, Urine YELLOW  YELLOW   APPearance CLEAR CLEAR   Specific Gravity, Urine 1.025 1.005 - 1.030   pH 5.5 5.0 - 8.0   Glucose, UA NEGATIVE NEGATIVE mg/dL   Hgb urine dipstick SMALL (A) NEGATIVE   Bilirubin Urine NEGATIVE NEGATIVE   Ketones, ur NEGATIVE NEGATIVE mg/dL   Protein, ur 30 (A) NEGATIVE mg/dL   Nitrite NEGATIVE NEGATIVE   Leukocytes, UA NEGATIVE NEGATIVE  Urine microscopic-add on     Status: Abnormal   Collection Time: 03/12/15 12:20 PM  Result Value Ref Range   Squamous Epithelial / LPF NONE SEEN NONE SEEN   WBC, UA 0-5 0 - 5 WBC/hpf   RBC / HPF 0-5 0 - 5 RBC/hpf   Bacteria, UA MANY (A) NONE SEEN  Lactic acid, plasma     Status: Abnormal   Collection Time: 03/12/15 12:45 PM  Result Value Ref Range   Lactic Acid, Venous 7.0 (HH) 0.5 - 2.0 mmol/L    Comment: CRITICAL RESULT CALLED TO, READ BACK BY AND VERIFIED WITH: MITCHELL,M AT 1:25PM ON 03/12/15 BY FESTERMAN,C   MRSA PCR Screening     Status: None   Collection Time: 03/12/15  2:20 PM  Result Value Ref Range   MRSA by PCR NEGATIVE NEGATIVE    Comment:        The GeneXpert MRSA Assay (FDA approved for NASAL specimens only), is one component of a comprehensive MRSA colonization surveillance program. It is not intended to diagnose MRSA infection nor to guide or monitor treatment for MRSA infections.   Lactic acid, plasma     Status: Abnormal   Collection Time: 03/12/15  3:39 PM  Result Value Ref Range   Lactic Acid, Venous 4.9 (HH) 0.5 - 2.0 mmol/L    Comment: CRITICAL RESULT CALLED TO, READ BACK BY AND VERIFIED WITH: ROWE,N ON 03/12/15 AT 1620 BY LOY,C   TSH     Status: None   Collection Time: 03/13/15  5:43 AM  Result Value Ref Range   TSH 1.695 0.350 - 4.500 uIU/mL  Basic metabolic panel     Status: Abnormal   Collection Time: 03/13/15  5:43 AM  Result Value Ref Range   Sodium 141 135 - 145 mmol/L   Potassium 3.3 (L) 3.5 - 5.1 mmol/L   Chloride 114 (H) 101 - 111 mmol/L   CO2 21 (L) 22 - 32 mmol/L   Glucose, Bld 92 65  - 99 mg/dL   BUN 16 6 - 20 mg/dL   Creatinine, Ser 0.78 0.44 - 1.00 mg/dL    Comment: DELTA CHECK NOTED   Calcium 7.8 (L) 8.9 - 10.3 mg/dL   GFR calc non Af Amer >60 >60 mL/min   GFR calc Af Amer >60 >60 mL/min    Comment: (NOTE) The eGFR has been calculated using the CKD EPI equation. This calculation has not been validated in all clinical situations. eGFR's persistently <60 mL/min signify possible Chronic Kidney Disease.    Anion gap 6 5 - 15  CBC     Status: Abnormal   Collection Time: 03/13/15  5:43 AM  Result Value Ref Range   WBC 7.8 4.0 - 10.5 K/uL   RBC 4.15 3.87 - 5.11 MIL/uL   Hemoglobin 13.8 12.0 - 15.0 g/dL    Comment: DELTA CHECK NOTED RESULT REPEATED AND VERIFIED    HCT 41.4 36.0 - 46.0 %   MCV 99.8 78.0 - 100.0 fL   MCH 33.3 26.0 - 34.0 pg   MCHC 33.3 30.0 - 36.0 g/dL  RDW 12.9 11.5 - 15.5 %   Platelets 145 (L) 150 - 400 K/uL    Ct Chest Wo Contrast  03/12/2015  ADDENDUM REPORT: 03/12/2015 12:27 ADDENDUM: The impression should read: Small bowel dilatation consistent with partial small bowel obstruction secondary to adhesions. No definitive mass lesion is noted. Electronically Signed   By: Inez Catalina M.D.   On: 03/12/2015 12:27  03/12/2015  CLINICAL DATA:  Shortness of breath and abdominal pain EXAM: CT CHEST, ABDOMEN AND PELVIS WITHOUT CONTRAST TECHNIQUE: Multidetector CT imaging of the chest, abdomen and pelvis was performed following the standard protocol without IV contrast. COMPARISON:  05/10/2006 FINDINGS: CT CHEST Lungs are well aerated bilaterally. No focal confluent infiltrate or sizable effusion is noted. A ground-glass nodule is noted in the left lower lobe best seen on image number 27 of series 6. This is stable from the prior exam. No other focal nodules are seen. The thoracic aorta show some calcifications without aneurysmal dilatation. No hilar or mediastinal adenopathy is noted. Mild fluid is noted in the distal esophagus which may be related to  reflux. Small hiatal hernia is noted. No acute bony abnormality is seen. CT ABDOMEN AND PELVIS The liver, gallbladder, spleen, adrenal glands and pancreas are within normal limits. The kidneys are well visualized bilaterally without evidence of renal calculi or urinary tract obstructive changes. Diffuse small bowel dilatation is noted with a focal area of relatively smooth transition in the anterior abdomen best seen on image number 82 of series 2. There postsurgical changes in the anterior abdominal wall on these changes are likely related to adhesions as no definitive mass lesion is seen. More distal small bowel is within normal limits. Mild diverticular change of the colon as well as changes of prior colectomy are seen. No diverticulitis is noted. The appendix is not well seen although no inflammatory changes are noted. The bladder is well distended. The uterus has been surgically removed. Degenerative changes of lumbar spine are noted. IMPRESSION: Ground-glass nodule in the left lower lobe stable from the prior exam and likely benign in etiology. Small bowel dilatation likely related to adhesions. No definitive mass lesion is seen. No other acute abnormality is noted. Electronically Signed: By: Inez Catalina M.D. On: 03/12/2015 12:17   Dg Chest Port 1 View  03/12/2015  CLINICAL DATA:  Shortness of breath for 2 days. EXAM: PORTABLE CHEST 1 VIEW COMPARISON:  04/25/2006. FINDINGS: The cardiac silhouette, mediastinal and hilar contours are within normal limits and stable. Mild tortuosity and calcification of the thoracic aorta. The lungs are clear. Suspect emphysematous changes. No pleural effusion. The bony thorax is intact. IMPRESSION: No acute cardiopulmonary findings. Electronically Signed   By: Marijo Sanes M.D.   On: 03/12/2015 11:10   Ct Renal Stone Study  03/12/2015  ADDENDUM REPORT: 03/12/2015 12:27 ADDENDUM: The impression should read: Small bowel dilatation consistent with partial small bowel  obstruction secondary to adhesions. No definitive mass lesion is noted. Electronically Signed   By: Inez Catalina M.D.   On: 03/12/2015 12:27  03/12/2015  CLINICAL DATA:  Shortness of breath and abdominal pain EXAM: CT CHEST, ABDOMEN AND PELVIS WITHOUT CONTRAST TECHNIQUE: Multidetector CT imaging of the chest, abdomen and pelvis was performed following the standard protocol without IV contrast. COMPARISON:  05/10/2006 FINDINGS: CT CHEST Lungs are well aerated bilaterally. No focal confluent infiltrate or sizable effusion is noted. A ground-glass nodule is noted in the left lower lobe best seen on image number 27 of series 6. This is stable  from the prior exam. No other focal nodules are seen. The thoracic aorta show some calcifications without aneurysmal dilatation. No hilar or mediastinal adenopathy is noted. Mild fluid is noted in the distal esophagus which may be related to reflux. Small hiatal hernia is noted. No acute bony abnormality is seen. CT ABDOMEN AND PELVIS The liver, gallbladder, spleen, adrenal glands and pancreas are within normal limits. The kidneys are well visualized bilaterally without evidence of renal calculi or urinary tract obstructive changes. Diffuse small bowel dilatation is noted with a focal area of relatively smooth transition in the anterior abdomen best seen on image number 82 of series 2. There postsurgical changes in the anterior abdominal wall on these changes are likely related to adhesions as no definitive mass lesion is seen. More distal small bowel is within normal limits. Mild diverticular change of the colon as well as changes of prior colectomy are seen. No diverticulitis is noted. The appendix is not well seen although no inflammatory changes are noted. The bladder is well distended. The uterus has been surgically removed. Degenerative changes of lumbar spine are noted. IMPRESSION: Ground-glass nodule in the left lower lobe stable from the prior exam and likely benign in  etiology. Small bowel dilatation likely related to adhesions. No definitive mass lesion is seen. No other acute abnormality is noted. Electronically Signed: By: Inez Catalina M.D. On: 03/12/2015 12:17    ROS: See chart Blood pressure 146/69, pulse 101, temperature 98.7 F (37.1 C), temperature source Oral, resp. rate 20, height _0  (1.6 m), weight 56.2 kg (123 lb 14.4 oz), SpO2 94 %. Physical Exam: Pleasant white female in no acute distress. Abdomen is slightly distended but soft, nontender. No rigidity is noted. No hernias are noted. Facial bowel sounds appreciated.  Assessment/Plan: Impression: Bowel obstruction, partial due to adhesive disease versus ileus. She does not have an acute abdomen at this time. She does not need surgical intervention. Would advance her diet as tolerated. Will follow with you.  Erica [name] A 03/13/2015, 10:48 AM

## 2015-03-13 NOTE — Progress Notes (Signed)
Patient tolerating IV medication well with no reports of discomfort reported. -since starting Cardizem there has not been a HR >160 noted Patient to be re-evaluated to see if titration (increase) will help to better control the patient's HR.

## 2015-03-13 NOTE — Progress Notes (Signed)
Subjective: Erica [name] was admitted by the hospitalist to the SCU yesterday. She presented with vomiting for 2 days. She was profoundly dehydrated with elevations in her BUN, creatinine and hemoglobin. She was treated with vigorous intravenous fluid replacement. She has had no vomiting since 6 AM yesterday. She had a bowel movement yesterday morning. She was felt to have a partial small bowel obstruction based on x-rays and CT yesterday. She denies any bowel movements overnight. She's had no fever. She has had tachycardia and has been treated with intravenous diltiazem. She was initially treated with intravenous antibiotic therapy in the emergency room yesterday however these have been discontinued. She was initially hypotensive. Systolic pressures are now running in the 140s. She has been evaluated for her abdomen by Dr. Lovell Sheehan. Case was discussed with Dr. Lovell Sheehan this morning.  Objective: Vital signs in last 24 hours: Filed Vitals:   03/13/15 0300 03/13/15 0400 03/13/15 0429 03/13/15 0500  BP: 145/82 145/116  143/75  Pulse: 130 116  108  Temp:   98 F (36.7 C)   TempSrc:   Oral   Resp: Height:      Weight:    123 lb 14.4 oz (56.2 kg)  SpO2: 92% 93%  94%   Weight change:   Intake/Output Summary (Last 24 hours) at 03/13/15 0728 Last data filed at 03/12/15 2200  Gross per 24 hour  Intake   2765 ml  Output    100 ml  Net   2665 ml    Physical Exam: Alert. No distress. Pharynx dry. No scleral icterus. Lungs clear. Heart tachycardic in the low 100 range. Abdomen soft and nontender. Bowel sounds present. NG tube placement yesterday was unsuccessful. Extremities reveal no edema.  Lab Results:    Results for orders placed or performed during the hospital encounter of 03/12/15 (from the past 24 hour(s))  CBG monitoring, ED     Status: Abnormal   Collection Time: 03/12/15 10:15 AM  Result Value Ref Range   Glucose-Capillary 202 (H) 65 - 99 mg/dL  CBC with Differential      Status: Abnormal   Collection Time: 03/12/15 10:20 AM  Result Value Ref Range   WBC 14.8 (H) 4.0 - 10.5 K/uL   RBC 5.30 (H) 3.87 - 5.11 MIL/uL   Hemoglobin 18.3 (H) 12.0 - 15.0 g/dL   HCT 82.9 (H) 56.2 - 13.0 %   MCV 100.9 (H) 78.0 - 100.0 fL   MCH 34.5 (H) 26.0 - 34.0 pg   MCHC 34.2 30.0 - 36.0 g/dL   RDW 86.5 78.4 - 69.6 %   Platelets 222 150 - 400 K/uL   Neutrophils Relative % 67 %   Neutro Abs 10.0 (H) 1.7 - 7.7 K/uL   Lymphocytes Relative 9 %   Lymphs Abs 1.3 0.7 - 4.0 K/uL   Monocytes Relative 24 %   Monocytes Absolute 3.5 (H) 0.1 - 1.0 K/uL   Eosinophils Relative 0 %   Eosinophils Absolute 0.0 0.0 - 0.7 K/uL   Basophils Relative 0 %   Basophils Absolute 0.0 0.0 - 0.1 K/uL  Comprehensive metabolic panel     Status: Abnormal   Collection Time: 03/12/15 10:20 AM  Result Value Ref Range   Sodium 139 135 - 145 mmol/L   Potassium 3.7 3.5 - 5.1 mmol/L   Chloride 95 (L) 101 - 111 mmol/L   CO2 18 (L) 22 - 32 mmol/L   Glucose, Bld 248 (H) 65 - 99 mg/dL   BUN  32 (H) 6 - 20 mg/dL   Creatinine, Ser 6.07 (H) 0.44 - 1.00 mg/dL   Calcium 9.7 8.9 - 37.1 mg/dL   Total Protein 8.9 (H) 6.5 - 8.1 g/dL   Albumin 4.8 3.5 - 5.0 g/dL   AST 42 (H) 15 - 41 U/L   ALT 21 14 - 54 U/L   Alkaline Phosphatase 87 38 - 126 U/L   Total Bilirubin 1.0 0.3 - 1.2 mg/dL   GFR calc non Af Amer 17 (L) >60 mL/min   GFR calc Af Amer 19 (L) >60 mL/min  I-Stat CG4 Lactic Acid, ED  (not at  [facility])     Status: Abnormal   Collection Time: 03/12/15 10:22 AM  Result Value Ref Range   Lactic Acid, Venous 12.58 (HH) 0.5 - 2.0 mmol/L   Comment NOTIFIED PHYSICIAN   I-stat chem 8, ed     Status: Abnormal   Collection Time: 03/12/15 10:29 AM  Result Value Ref Range   Sodium 138 135 - 145 mmol/L   Potassium 3.6 3.5 - 5.1 mmol/L   Chloride 100 (L) 101 - 111 mmol/L   BUN 42 (H) 6 - 20 mg/dL   Creatinine, Ser 0.62 (H) 0.44 - 1.00 mg/dL   Glucose, Bld 694 (H) 65 - 99 mg/dL   Calcium, Ion 8.54 (L) 1.13 - 1.30 mmol/L    TCO2 19 0 - 100 mmol/L   Hemoglobin 20.7 (H) 12.0 - 15.0 g/dL   HCT 62.7 (H) 03.5 - 00.9 %  Blood Culture (routine x 2)     Status: None (Preliminary result)   Collection Time: 03/12/15 10:41 AM  Result Value Ref Range   Specimen Description BLOOD RIGHT HAND    Special Requests BOTTLES DRAWN AEROBIC ONLY 4CC    Culture NO GROWTH < 24 HOURS    Report Status PENDING   TSH     Status: Abnormal   Collection Time: 03/12/15 10:41 AM  Result Value Ref Range   TSH 14.075 (H) 0.350 - 4.500 uIU/mL  Blood Culture (routine x 2)     Status: None (Preliminary result)   Collection Time: 03/12/15 11:45 AM  Result Value Ref Range   Specimen Description BLOOD LEFT HAND    Special Requests BOTTLES DRAWN AEROBIC ONLY 4CC    Culture NO GROWTH < 24 HOURS    Report Status PENDING   Urinalysis, Routine w reflex microscopic (not at [facility])     Status: Abnormal   Collection Time: 03/12/15 12:20 PM  Result Value Ref Range   Color, Urine YELLOW YELLOW   APPearance CLEAR CLEAR   Specific Gravity, Urine 1.025 1.005 - 1.030   pH 5.5 5.0 - 8.0   Glucose, UA NEGATIVE NEGATIVE mg/dL   Hgb urine dipstick SMALL (A) NEGATIVE   Bilirubin Urine NEGATIVE NEGATIVE   Ketones, ur NEGATIVE NEGATIVE mg/dL   Protein, ur 30 (A) NEGATIVE mg/dL   Nitrite NEGATIVE NEGATIVE   Leukocytes, UA NEGATIVE NEGATIVE  Urine microscopic-add on     Status: Abnormal   Collection Time: 03/12/15 12:20 PM  Result Value Ref Range   Squamous Epithelial / LPF NONE SEEN NONE SEEN   WBC, UA 0-5 0 - 5 WBC/hpf   RBC / HPF 0-5 0 - 5 RBC/hpf   Bacteria, UA MANY (A) NONE SEEN  Lactic acid, plasma     Status: Abnormal   Collection Time: 03/12/15 12:45 PM  Result Value Ref Range   Lactic Acid, Venous 7.0 (HH) 0.5 - 2.0 mmol/L  MRSA  PCR Screening     Status: None   Collection Time: 03/12/15  2:20 PM  Result Value Ref Range   MRSA by PCR NEGATIVE NEGATIVE  Lactic acid, plasma     Status: Abnormal   Collection Time: 03/12/15  3:39 PM  Result  Value Ref Range   Lactic Acid, Venous 4.9 (HH) 0.5 - 2.0 mmol/L  TSH     Status: None   Collection Time: 03/13/15  5:43 AM  Result Value Ref Range   TSH 1.695 0.350 - 4.500 uIU/mL  Basic metabolic panel     Status: Abnormal   Collection Time: 03/13/15  5:43 AM  Result Value Ref Range   Sodium 141 135 - 145 mmol/L   Potassium 3.3 (L) 3.5 - 5.1 mmol/L   Chloride 114 (H) 101 - 111 mmol/L   CO2 21 (L) 22 - 32 mmol/L   Glucose, Bld 92 65 - 99 mg/dL   BUN 16 6 - 20 mg/dL   Creatinine, Ser 6.83 0.44 - 1.00 mg/dL   Calcium 7.8 (L) 8.9 - 10.3 mg/dL   GFR calc non Af Amer >60 >60 mL/min   GFR calc Af Amer >60 >60 mL/min   Anion gap 6 5 - 15  CBC     Status: Abnormal   Collection Time: 03/13/15  5:43 AM  Result Value Ref Range   WBC 7.8 4.0 - 10.5 K/uL   RBC 4.15 3.87 - 5.11 MIL/uL   Hemoglobin 13.8 12.0 - 15.0 g/dL   HCT 72.9 02.1 - 11.5 %   MCV 99.8 78.0 - 100.0 fL   MCH 33.3 26.0 - 34.0 pg   MCHC 33.3 30.0 - 36.0 g/dL   RDW 52.0 80.2 - 23.3 %   Platelets 145 (L) 150 - 400 K/uL     ABGS  Recent Labs  03/12/15 1029  TCO2 19   CULTURES Recent Results (from the past 240 hour(s))  Blood Culture (routine x 2)     Status: None (Preliminary result)   Collection Time: 03/12/15 10:41 AM  Result Value Ref Range Status   Specimen Description BLOOD RIGHT HAND  Final   Special Requests BOTTLES DRAWN AEROBIC ONLY 4CC  Final   Culture NO GROWTH < 24 HOURS  Final   Report Status PENDING  Incomplete  Blood Culture (routine x 2)     Status: None (Preliminary result)   Collection Time: 03/12/15 11:45 AM  Result Value Ref Range Status   Specimen Description BLOOD LEFT HAND  Final   Special Requests BOTTLES DRAWN AEROBIC ONLY 4CC  Final   Culture NO GROWTH < 24 HOURS  Final   Report Status PENDING  Incomplete  MRSA PCR Screening     Status: None   Collection Time: 03/12/15  2:20 PM  Result Value Ref Range Status   MRSA by PCR NEGATIVE NEGATIVE Final    Comment:        The GeneXpert  MRSA Assay (FDA approved for NASAL specimens only), is one component of a comprehensive MRSA colonization surveillance program. It is not intended to diagnose MRSA infection nor to guide or monitor treatment for MRSA infections.    Studies/Results: Ct Chest Wo Contrast  03/12/2015  ADDENDUM REPORT: 03/12/2015 12:27 ADDENDUM: The impression should read: Small bowel dilatation consistent with partial small bowel obstruction secondary to adhesions. No definitive mass lesion is noted. Electronically Signed   By: Alcide Clever M.D.   On: 03/12/2015 12:27  03/12/2015  CLINICAL DATA:  Shortness of breath  and abdominal pain EXAM: CT CHEST, ABDOMEN AND PELVIS WITHOUT CONTRAST TECHNIQUE: Multidetector CT imaging of the chest, abdomen and pelvis was performed following the standard protocol without IV contrast. COMPARISON:  05/10/2006 FINDINGS: CT CHEST Lungs are well aerated bilaterally. No focal confluent infiltrate or sizable effusion is noted. A ground-glass nodule is noted in the left lower lobe best seen on image number 27 of series 6. This is stable from the prior exam. No other focal nodules are seen. The thoracic aorta show some calcifications without aneurysmal dilatation. No hilar or mediastinal adenopathy is noted. Mild fluid is noted in the distal esophagus which may be related to reflux. Small hiatal hernia is noted. No acute bony abnormality is seen. CT ABDOMEN AND PELVIS The liver, gallbladder, spleen, adrenal glands and pancreas are within normal limits. The kidneys are well visualized bilaterally without evidence of renal calculi or urinary tract obstructive changes. Diffuse small bowel dilatation is noted with a focal area of relatively smooth transition in the anterior abdomen best seen on image number 82 of series 2. There postsurgical changes in the anterior abdominal wall on these changes are likely related to adhesions as no definitive mass lesion is seen. More distal small bowel is  within normal limits. Mild diverticular change of the colon as well as changes of prior colectomy are seen. No diverticulitis is noted. The appendix is not well seen although no inflammatory changes are noted. The bladder is well distended. The uterus has been surgically removed. Degenerative changes of lumbar spine are noted. IMPRESSION: Ground-glass nodule in the left lower lobe stable from the prior exam and likely benign in etiology. Small bowel dilatation likely related to adhesions. No definitive mass lesion is seen. No other acute abnormality is noted. Electronically Signed: By: Alcide Clever M.D. On: 03/12/2015 12:17   Dg Chest Port 1 View  03/12/2015  CLINICAL DATA:  Shortness of breath for 2 days. EXAM: PORTABLE CHEST 1 VIEW COMPARISON:  04/25/2006. FINDINGS: The cardiac silhouette, mediastinal and hilar contours are within normal limits and stable. Mild tortuosity and calcification of the thoracic aorta. The lungs are clear. Suspect emphysematous changes. No pleural effusion. The bony thorax is intact. IMPRESSION: No acute cardiopulmonary findings. Electronically Signed   By: Rudie Meyer M.D.   On: 03/12/2015 11:10   Ct Renal Stone Study  03/12/2015  ADDENDUM REPORT: 03/12/2015 12:27 ADDENDUM: The impression should read: Small bowel dilatation consistent with partial small bowel obstruction secondary to adhesions. No definitive mass lesion is noted. Electronically Signed   By: Alcide Clever M.D.   On: 03/12/2015 12:27  03/12/2015  CLINICAL DATA:  Shortness of breath and abdominal pain EXAM: CT CHEST, ABDOMEN AND PELVIS WITHOUT CONTRAST TECHNIQUE: Multidetector CT imaging of the chest, abdomen and pelvis was performed following the standard protocol without IV contrast. COMPARISON:  05/10/2006 FINDINGS: CT CHEST Lungs are well aerated bilaterally. No focal confluent infiltrate or sizable effusion is noted. A ground-glass nodule is noted in the left lower lobe best seen on image number 27 of  series 6. This is stable from the prior exam. No other focal nodules are seen. The thoracic aorta show some calcifications without aneurysmal dilatation. No hilar or mediastinal adenopathy is noted. Mild fluid is noted in the distal esophagus which may be related to reflux. Small hiatal hernia is noted. No acute bony abnormality is seen. CT ABDOMEN AND PELVIS The liver, gallbladder, spleen, adrenal glands and pancreas are within normal limits. The kidneys are well visualized bilaterally without evidence  of renal calculi or urinary tract obstructive changes. Diffuse small bowel dilatation is noted with a focal area of relatively smooth transition in the anterior abdomen best seen on image number 82 of series 2. There postsurgical changes in the anterior abdominal wall on these changes are likely related to adhesions as no definitive mass lesion is seen. More distal small bowel is within normal limits. Mild diverticular change of the colon as well as changes of prior colectomy are seen. No diverticulitis is noted. The appendix is not well seen although no inflammatory changes are noted. The bladder is well distended. The uterus has been surgically removed. Degenerative changes of lumbar spine are noted. IMPRESSION: Ground-glass nodule in the left lower lobe stable from the prior exam and likely benign in etiology. Small bowel dilatation likely related to adhesions. No definitive mass lesion is seen. No other acute abnormality is noted. Electronically Signed: By: Alcide Clever M.D. On: 03/12/2015 12:17   Micro Results: Recent Results (from the past 240 hour(s))  Blood Culture (routine x 2)     Status: None (Preliminary result)   Collection Time: 03/12/15 10:41 AM  Result Value Ref Range Status   Specimen Description BLOOD RIGHT HAND  Final   Special Requests BOTTLES DRAWN AEROBIC ONLY 4CC  Final   Culture NO GROWTH < 24 HOURS  Final   Report Status PENDING  Incomplete  Blood Culture (routine x 2)     Status:  None (Preliminary result)   Collection Time: 03/12/15 11:45 AM  Result Value Ref Range Status   Specimen Description BLOOD LEFT HAND  Final   Special Requests BOTTLES DRAWN AEROBIC ONLY 4CC  Final   Culture NO GROWTH < 24 HOURS  Final   Report Status PENDING  Incomplete  MRSA PCR Screening     Status: None   Collection Time: 03/12/15  2:20 PM  Result Value Ref Range Status   MRSA by PCR NEGATIVE NEGATIVE Final    Comment:        The GeneXpert MRSA Assay (FDA approved for NASAL specimens only), is one component of a comprehensive MRSA colonization surveillance program. It is not intended to diagnose MRSA infection nor to guide or monitor treatment for MRSA infections.    Studies/Results: Ct Chest Wo Contrast  03/12/2015  ADDENDUM REPORT: 03/12/2015 12:27 ADDENDUM: The impression should read: Small bowel dilatation consistent with partial small bowel obstruction secondary to adhesions. No definitive mass lesion is noted. Electronically Signed   By: Alcide Clever M.D.   On: 03/12/2015 12:27  03/12/2015  CLINICAL DATA:  Shortness of breath and abdominal pain EXAM: CT CHEST, ABDOMEN AND PELVIS WITHOUT CONTRAST TECHNIQUE: Multidetector CT imaging of the chest, abdomen and pelvis was performed following the standard protocol without IV contrast. COMPARISON:  05/10/2006 FINDINGS: CT CHEST Lungs are well aerated bilaterally. No focal confluent infiltrate or sizable effusion is noted. A ground-glass nodule is noted in the left lower lobe best seen on image number 27 of series 6. This is stable from the prior exam. No other focal nodules are seen. The thoracic aorta show some calcifications without aneurysmal dilatation. No hilar or mediastinal adenopathy is noted. Mild fluid is noted in the distal esophagus which may be related to reflux. Small hiatal hernia is noted. No acute bony abnormality is seen. CT ABDOMEN AND PELVIS The liver, gallbladder, spleen, adrenal glands and pancreas are within  normal limits. The kidneys are well visualized bilaterally without evidence of renal calculi or urinary tract obstructive changes. Diffuse  small bowel dilatation is noted with a focal area of relatively smooth transition in the anterior abdomen best seen on image number 82 of series 2. There postsurgical changes in the anterior abdominal wall on these changes are likely related to adhesions as no definitive mass lesion is seen. More distal small bowel is within normal limits. Mild diverticular change of the colon as well as changes of prior colectomy are seen. No diverticulitis is noted. The appendix is not well seen although no inflammatory changes are noted. The bladder is well distended. The uterus has been surgically removed. Degenerative changes of lumbar spine are noted. IMPRESSION: Ground-glass nodule in the left lower lobe stable from the prior exam and likely benign in etiology. Small bowel dilatation likely related to adhesions. No definitive mass lesion is seen. No other acute abnormality is noted. Electronically Signed: By: Alcide Clever M.D. On: 03/12/2015 12:17   Dg Chest Port 1 View  03/12/2015  CLINICAL DATA:  Shortness of breath for 2 days. EXAM: PORTABLE CHEST 1 VIEW COMPARISON:  04/25/2006. FINDINGS: The cardiac silhouette, mediastinal and hilar contours are within normal limits and stable. Mild tortuosity and calcification of the thoracic aorta. The lungs are clear. Suspect emphysematous changes. No pleural effusion. The bony thorax is intact. IMPRESSION: No acute cardiopulmonary findings. Electronically Signed   By: Rudie Meyer M.D.   On: 03/12/2015 11:10   Ct Renal Stone Study  03/12/2015  ADDENDUM REPORT: 03/12/2015 12:27 ADDENDUM: The impression should read: Small bowel dilatation consistent with partial small bowel obstruction secondary to adhesions. No definitive mass lesion is noted. Electronically Signed   By: Alcide Clever M.D.   On: 03/12/2015 12:27  03/12/2015  CLINICAL DATA:   Shortness of breath and abdominal pain EXAM: CT CHEST, ABDOMEN AND PELVIS WITHOUT CONTRAST TECHNIQUE: Multidetector CT imaging of the chest, abdomen and pelvis was performed following the standard protocol without IV contrast. COMPARISON:  05/10/2006 FINDINGS: CT CHEST Lungs are well aerated bilaterally. No focal confluent infiltrate or sizable effusion is noted. A ground-glass nodule is noted in the left lower lobe best seen on image number 27 of series 6. This is stable from the prior exam. No other focal nodules are seen. The thoracic aorta show some calcifications without aneurysmal dilatation. No hilar or mediastinal adenopathy is noted. Mild fluid is noted in the distal esophagus which may be related to reflux. Small hiatal hernia is noted. No acute bony abnormality is seen. CT ABDOMEN AND PELVIS The liver, gallbladder, spleen, adrenal glands and pancreas are within normal limits. The kidneys are well visualized bilaterally without evidence of renal calculi or urinary tract obstructive changes. Diffuse small bowel dilatation is noted with a focal area of relatively smooth transition in the anterior abdomen best seen on image number 82 of series 2. There postsurgical changes in the anterior abdominal wall on these changes are likely related to adhesions as no definitive mass lesion is seen. More distal small bowel is within normal limits. Mild diverticular change of the colon as well as changes of prior colectomy are seen. No diverticulitis is noted. The appendix is not well seen although no inflammatory changes are noted. The bladder is well distended. The uterus has been surgically removed. Degenerative changes of lumbar spine are noted. IMPRESSION: Ground-glass nodule in the left lower lobe stable from the prior exam and likely benign in etiology. Small bowel dilatation likely related to adhesions. No definitive mass lesion is seen. No other acute abnormality is noted. Electronically Signed: By: Loraine Leriche  Lukens M.D. On: 03/12/2015 12:17   Medications:  I have reviewed the patient's current medications Scheduled Meds: . enoxaparin (LOVENOX) injection  30 mg Subcutaneous Q24H  . levothyroxine  37.5 mcg Intravenous Daily  . potassium chloride  10 mEq Intravenous Q1 Hr x 4   Continuous Infusions: . sodium chloride 150 mL/hr at 03/12/15 2011  . diltiazem (CARDIZEM) infusion 10 mg/hr (03/13/15 0245)   PRN Meds:.LORazepam, morphine injection, ondansetron **OR** ondansetron (ZOFRAN) IV   Assessment/Plan: #1. Dehydration due to nausea vomiting. Much improved. BUN and creatinine and hemoglobin are now normal. Continue IV fluids. She initially had a high lactic acid level has improved as well. #2. Partial small bowel obstruction. Improving. Start clear liquids this morning. She has tolerated ice chips. #3. Hypothyroidism. TSH was repeated and is now normal. She had recently been euthyroid as an outpatient on her current Synthroid dose. #4. Tachycardia. She runs a baseline sinus tachycardia in the 100-110 range. Heart rates were faster last night and her daughter-in-law reports that she had some periods of hypoxia requiring oxygen supplementation. She may require sleep study following her hospitalization. #5. Bipolar depression. Principal Problem:   Partial small bowel obstruction (HCC) Active Problems:   Depression   Hypotension   Lactic acidosis   Dehydration   SBO (small bowel obstruction) (HCC)     LOS: 1 day   Erica [name] 03/13/2015, 7:28 AM

## 2015-03-13 NOTE — Care Management Note (Signed)
Case Management Note  Patient Details  Name: Erica [name] MRN: [id number] Date of Birth: January 29, 1933  Subjective/Objective:                  Pt admitted with SBO. Pt is from home, lives with her husband and is ind with ADL's. Pt plans to return home with self care.   Action/Plan: No CM needs anticipated.   Expected Discharge Date:  03/14/15               Expected Discharge Plan:  Home/Self Care  In-House Referral:  NA  Discharge planning Services  CM Consult  Post Acute Care Choice:  NA Choice offered to:  NA  DME Arranged:    DME Agency:     HH Arranged:    HH Agency:     Status of Service:  Completed, signed off  Medicare Important Message Given:  Yes Date Medicare IM Given:    Medicare IM give by:    Date Additional Medicare IM Given:    Additional Medicare Important Message give by:     If discussed at Long Length of Stay Meetings, dates discussed:    Additional Comments:  Malcolm Metro, RN 03/13/2015, 2:37 PM

## 2015-03-13 NOTE — Care Management Important Message (Signed)
Important Message  Patient Details  Name: Erica [name] MRN: [id number] Date of Birth: 1932-06-13   Medicare Important Message Given:  Yes    Malcolm Metro, RN 03/13/2015, 2:36 PM

## 2015-03-13 NOTE — Progress Notes (Signed)
Patient is in no apparent distress with RR and effort WDL. Patient noted to have an episode where the HR increased to 165. -EKG ordered

## 2015-03-14 LAB — BASIC METABOLIC PANEL
Anion gap: 10 (ref 5–15)
BUN: 8 mg/dL (ref 6–20)
CALCIUM: 8.3 mg/dL — AB (ref 8.9–10.3)
CO2: 21 mmol/L — ABNORMAL LOW (ref 22–32)
CREATININE: 0.56 mg/dL (ref 0.44–1.00)
Chloride: 105 mmol/L (ref 101–111)
GFR calc non Af Amer: >60 mL/min (ref >60)
Glucose, Bld: 97 mg/dL (ref 65–99)
Potassium: 2.8 mmol/L — ABNORMAL LOW (ref 3.5–5.1)
SODIUM: 136 mmol/L (ref 135–145)

## 2015-03-14 MED ORDER — KCL IN DEXTROSE-NACL 20-5-0.45 MEQ/L-%-% IV SOLN
INTRAVENOUS | Status: DC
  Administered 2015-03-14 – 2015-03-19 (×8): via INTRAVENOUS

## 2015-03-14 MED ORDER — POTASSIUM CHLORIDE 10 MEQ/100ML IV SOLN
10.0000 meq | INTRAVENOUS | Status: AC
  Administered 2015-03-14 (×6): 10 meq via INTRAVENOUS
  Filled 2015-03-14: qty 100

## 2015-03-14 NOTE — Progress Notes (Signed)
eLink Physician-Brief Progress Note Patient Name: Erica [name] DOB: 06/08/1932 MRN: [id number]   Date of Service  03/14/2015  HPI/Events of Note  Hypokalemia  eICU Interventions  Potassium replaced     Intervention Category Intermediate Interventions: Electrolyte abnormality - evaluation and management  Naria Abbey 03/14/2015, 7:07 AM

## 2015-03-14 NOTE — Progress Notes (Signed)
Subjective: Erica [name] required placement of a nasogastric every by Dr. Lovell Sheehan this morning. She has had a small amount of NG drainage thus far but does feel some relief with it. She has had no fever. She has had no further hypotension. She denies abdominal pain now. She has not had passage of gas but did have a small stool yesterday.  Objective: Vital signs in last 24 hours: Filed Vitals:   03/14/15 0600 03/14/15 0700 03/14/15 0752 03/14/15 0800  BP: 172/78 176/82  165/87  Pulse: 91 100    Temp:   97.7 F (36.5 C)   TempSrc:   Oral   Resp: 17 20    Height:      Weight:      SpO2: 98% 95%     Weight change: 9 lb 3.3 oz (4.176 kg)  Intake/Output Summary (Last 24 hours) at 03/14/15 1013 Last data filed at 03/14/15 0807  Gross per 24 hour  Intake   4250 ml  Output      0 ml  Net   4250 ml    Physical Exam: Sedated following Ativan. Lungs clear. Heart regular in the 90s. Abdomen is soft and nontender with no palpable organomegaly. Extremities reveal no edema.  Lab Results:    Results for orders placed or performed during the hospital encounter of 03/12/15 (from the past 24 hour(s))  Basic metabolic panel     Status: Abnormal   Collection Time: 03/14/15  4:00 AM  Result Value Ref Range   Sodium 136 135 - 145 mmol/L   Potassium 2.8 (L) 3.5 - 5.1 mmol/L   Chloride 105 101 - 111 mmol/L   CO2 21 (L) 22 - 32 mmol/L   Glucose, Bld 97 65 - 99 mg/dL   BUN 8 6 - 20 mg/dL   Creatinine, Ser 1.61 0.44 - 1.00 mg/dL   Calcium 8.3 (L) 8.9 - 10.3 mg/dL   GFR calc non Af Amer >60 >60 mL/min   GFR calc Af Amer >60 >60 mL/min   Anion gap 10 5 - 15     ABGS  Recent Labs  03/12/15 1029  TCO2 19   CULTURES Recent Results (from the past 240 hour(s))  Blood Culture (routine x 2)     Status: None (Preliminary result)   Collection Time: 03/12/15 10:41 AM  Result Value Ref Range Status   Specimen Description BLOOD RIGHT HAND  Final   Special Requests BOTTLES DRAWN AEROBIC ONLY 4CC   Final   Culture NO GROWTH 2 DAYS  Final   Report Status PENDING  Incomplete  Blood Culture (routine x 2)     Status: None (Preliminary result)   Collection Time: 03/12/15 11:45 AM  Result Value Ref Range Status   Specimen Description BLOOD LEFT HAND  Final   Special Requests BOTTLES DRAWN AEROBIC ONLY 4CC  Final   Culture NO GROWTH 2 DAYS  Final   Report Status PENDING  Incomplete  Urine culture     Status: None   Collection Time: 03/12/15 12:00 PM  Result Value Ref Range Status   Specimen Description URINE, CATHETERIZED  Final   Special Requests NONE  Final   Culture   Final    NO GROWTH 1 DAY Performed at [facility]    Report Status 03/13/2015 FINAL  Final  MRSA PCR Screening     Status: None   Collection Time: 03/12/15  2:20 PM  Result Value Ref Range Status   MRSA by PCR NEGATIVE NEGATIVE Final  Comment:        The GeneXpert MRSA Assay (FDA approved for NASAL specimens only), is one component of a comprehensive MRSA colonization surveillance program. It is not intended to diagnose MRSA infection nor to guide or monitor treatment for MRSA infections.    Studies/Results: Ct Chest Wo Contrast  03/12/2015  ADDENDUM REPORT: 03/12/2015 12:27 ADDENDUM: The impression should read: Small bowel dilatation consistent with partial small bowel obstruction secondary to adhesions. No definitive mass lesion is noted. Electronically Signed   By: Alcide Clever M.D.   On: 03/12/2015 12:27  03/12/2015  CLINICAL DATA:  Shortness of breath and abdominal pain EXAM: CT CHEST, ABDOMEN AND PELVIS WITHOUT CONTRAST TECHNIQUE: Multidetector CT imaging of the chest, abdomen and pelvis was performed following the standard protocol without IV contrast. COMPARISON:  05/10/2006 FINDINGS: CT CHEST Lungs are well aerated bilaterally. No focal confluent infiltrate or sizable effusion is noted. A ground-glass nodule is noted in the left lower lobe best seen on image number 27 of series 6. This is  stable from the prior exam. No other focal nodules are seen. The thoracic aorta show some calcifications without aneurysmal dilatation. No hilar or mediastinal adenopathy is noted. Mild fluid is noted in the distal esophagus which may be related to reflux. Small hiatal hernia is noted. No acute bony abnormality is seen. CT ABDOMEN AND PELVIS The liver, gallbladder, spleen, adrenal glands and pancreas are within normal limits. The kidneys are well visualized bilaterally without evidence of renal calculi or urinary tract obstructive changes. Diffuse small bowel dilatation is noted with a focal area of relatively smooth transition in the anterior abdomen best seen on image number 82 of series 2. There postsurgical changes in the anterior abdominal wall on these changes are likely related to adhesions as no definitive mass lesion is seen. More distal small bowel is within normal limits. Mild diverticular change of the colon as well as changes of prior colectomy are seen. No diverticulitis is noted. The appendix is not well seen although no inflammatory changes are noted. The bladder is well distended. The uterus has been surgically removed. Degenerative changes of lumbar spine are noted. IMPRESSION: Ground-glass nodule in the left lower lobe stable from the prior exam and likely benign in etiology. Small bowel dilatation likely related to adhesions. No definitive mass lesion is seen. No other acute abnormality is noted. Electronically Signed: By: Alcide Clever M.D. On: 03/12/2015 12:17   Dg Chest Port 1 View  03/12/2015  CLINICAL DATA:  Shortness of breath for 2 days. EXAM: PORTABLE CHEST 1 VIEW COMPARISON:  04/25/2006. FINDINGS: The cardiac silhouette, mediastinal and hilar contours are within normal limits and stable. Mild tortuosity and calcification of the thoracic aorta. The lungs are clear. Suspect emphysematous changes. No pleural effusion. The bony thorax is intact. IMPRESSION: No acute cardiopulmonary  findings. Electronically Signed   By: Rudie Meyer M.D.   On: 03/12/2015 11:10   Ct Renal Stone Study  03/12/2015  ADDENDUM REPORT: 03/12/2015 12:27 ADDENDUM: The impression should read: Small bowel dilatation consistent with partial small bowel obstruction secondary to adhesions. No definitive mass lesion is noted. Electronically Signed   By: Alcide Clever M.D.   On: 03/12/2015 12:27  03/12/2015  CLINICAL DATA:  Shortness of breath and abdominal pain EXAM: CT CHEST, ABDOMEN AND PELVIS WITHOUT CONTRAST TECHNIQUE: Multidetector CT imaging of the chest, abdomen and pelvis was performed following the standard protocol without IV contrast. COMPARISON:  05/10/2006 FINDINGS: CT CHEST Lungs are well aerated bilaterally.  No focal confluent infiltrate or sizable effusion is noted. A ground-glass nodule is noted in the left lower lobe best seen on image number 27 of series 6. This is stable from the prior exam. No other focal nodules are seen. The thoracic aorta show some calcifications without aneurysmal dilatation. No hilar or mediastinal adenopathy is noted. Mild fluid is noted in the distal esophagus which may be related to reflux. Small hiatal hernia is noted. No acute bony abnormality is seen. CT ABDOMEN AND PELVIS The liver, gallbladder, spleen, adrenal glands and pancreas are within normal limits. The kidneys are well visualized bilaterally without evidence of renal calculi or urinary tract obstructive changes. Diffuse small bowel dilatation is noted with a focal area of relatively smooth transition in the anterior abdomen best seen on image number 82 of series 2. There postsurgical changes in the anterior abdominal wall on these changes are likely related to adhesions as no definitive mass lesion is seen. More distal small bowel is within normal limits. Mild diverticular change of the colon as well as changes of prior colectomy are seen. No diverticulitis is noted. The appendix is not well seen although no  inflammatory changes are noted. The bladder is well distended. The uterus has been surgically removed. Degenerative changes of lumbar spine are noted. IMPRESSION: Ground-glass nodule in the left lower lobe stable from the prior exam and likely benign in etiology. Small bowel dilatation likely related to adhesions. No definitive mass lesion is seen. No other acute abnormality is noted. Electronically Signed: By: Alcide Clever M.D. On: 03/12/2015 12:17   Micro Results: Recent Results (from the past 240 hour(s))  Blood Culture (routine x 2)     Status: None (Preliminary result)   Collection Time: 03/12/15 10:41 AM  Result Value Ref Range Status   Specimen Description BLOOD RIGHT HAND  Final   Special Requests BOTTLES DRAWN AEROBIC ONLY 4CC  Final   Culture NO GROWTH 2 DAYS  Final   Report Status PENDING  Incomplete  Blood Culture (routine x 2)     Status: None (Preliminary result)   Collection Time: 03/12/15 11:45 AM  Result Value Ref Range Status   Specimen Description BLOOD LEFT HAND  Final   Special Requests BOTTLES DRAWN AEROBIC ONLY 4CC  Final   Culture NO GROWTH 2 DAYS  Final   Report Status PENDING  Incomplete  Urine culture     Status: None   Collection Time: 03/12/15 12:00 PM  Result Value Ref Range Status   Specimen Description URINE, CATHETERIZED  Final   Special Requests NONE  Final   Culture   Final    NO GROWTH 1 DAY Performed at [facility]    Report Status 03/13/2015 FINAL  Final  MRSA PCR Screening     Status: None   Collection Time: 03/12/15  2:20 PM  Result Value Ref Range Status   MRSA by PCR NEGATIVE NEGATIVE Final    Comment:        The GeneXpert MRSA Assay (FDA approved for NASAL specimens only), is one component of a comprehensive MRSA colonization surveillance program. It is not intended to diagnose MRSA infection nor to guide or monitor treatment for MRSA infections.    Studies/Results: Ct Chest Wo Contrast  03/12/2015  ADDENDUM REPORT:  03/12/2015 12:27 ADDENDUM: The impression should read: Small bowel dilatation consistent with partial small bowel obstruction secondary to adhesions. No definitive mass lesion is noted. Electronically Signed   By: Eulah Pont.D.  On: 03/12/2015 12:27  03/12/2015  CLINICAL DATA:  Shortness of breath and abdominal pain EXAM: CT CHEST, ABDOMEN AND PELVIS WITHOUT CONTRAST TECHNIQUE: Multidetector CT imaging of the chest, abdomen and pelvis was performed following the standard protocol without IV contrast. COMPARISON:  05/10/2006 FINDINGS: CT CHEST Lungs are well aerated bilaterally. No focal confluent infiltrate or sizable effusion is noted. A ground-glass nodule is noted in the left lower lobe best seen on image number 27 of series 6. This is stable from the prior exam. No other focal nodules are seen. The thoracic aorta show some calcifications without aneurysmal dilatation. No hilar or mediastinal adenopathy is noted. Mild fluid is noted in the distal esophagus which may be related to reflux. Small hiatal hernia is noted. No acute bony abnormality is seen. CT ABDOMEN AND PELVIS The liver, gallbladder, spleen, adrenal glands and pancreas are within normal limits. The kidneys are well visualized bilaterally without evidence of renal calculi or urinary tract obstructive changes. Diffuse small bowel dilatation is noted with a focal area of relatively smooth transition in the anterior abdomen best seen on image number 82 of series 2. There postsurgical changes in the anterior abdominal wall on these changes are likely related to adhesions as no definitive mass lesion is seen. More distal small bowel is within normal limits. Mild diverticular change of the colon as well as changes of prior colectomy are seen. No diverticulitis is noted. The appendix is not well seen although no inflammatory changes are noted. The bladder is well distended. The uterus has been surgically removed. Degenerative changes of lumbar spine  are noted. IMPRESSION: Ground-glass nodule in the left lower lobe stable from the prior exam and likely benign in etiology. Small bowel dilatation likely related to adhesions. No definitive mass lesion is seen. No other acute abnormality is noted. Electronically Signed: By: Alcide Clever M.D. On: 03/12/2015 12:17   Dg Chest Port 1 View  03/12/2015  CLINICAL DATA:  Shortness of breath for 2 days. EXAM: PORTABLE CHEST 1 VIEW COMPARISON:  04/25/2006. FINDINGS: The cardiac silhouette, mediastinal and hilar contours are within normal limits and stable. Mild tortuosity and calcification of the thoracic aorta. The lungs are clear. Suspect emphysematous changes. No pleural effusion. The bony thorax is intact. IMPRESSION: No acute cardiopulmonary findings. Electronically Signed   By: Rudie Meyer M.D.   On: 03/12/2015 11:10   Ct Renal Stone Study  03/12/2015  ADDENDUM REPORT: 03/12/2015 12:27 ADDENDUM: The impression should read: Small bowel dilatation consistent with partial small bowel obstruction secondary to adhesions. No definitive mass lesion is noted. Electronically Signed   By: Alcide Clever M.D.   On: 03/12/2015 12:27  03/12/2015  CLINICAL DATA:  Shortness of breath and abdominal pain EXAM: CT CHEST, ABDOMEN AND PELVIS WITHOUT CONTRAST TECHNIQUE: Multidetector CT imaging of the chest, abdomen and pelvis was performed following the standard protocol without IV contrast. COMPARISON:  05/10/2006 FINDINGS: CT CHEST Lungs are well aerated bilaterally. No focal confluent infiltrate or sizable effusion is noted. A ground-glass nodule is noted in the left lower lobe best seen on image number 27 of series 6. This is stable from the prior exam. No other focal nodules are seen. The thoracic aorta show some calcifications without aneurysmal dilatation. No hilar or mediastinal adenopathy is noted. Mild fluid is noted in the distal esophagus which may be related to reflux. Small hiatal hernia is noted. No acute bony  abnormality is seen. CT ABDOMEN AND PELVIS The liver, gallbladder, spleen, adrenal glands and pancreas  are within normal limits. The kidneys are well visualized bilaterally without evidence of renal calculi or urinary tract obstructive changes. Diffuse small bowel dilatation is noted with a focal area of relatively smooth transition in the anterior abdomen best seen on image number 82 of series 2. There postsurgical changes in the anterior abdominal wall on these changes are likely related to adhesions as no definitive mass lesion is seen. More distal small bowel is within normal limits. Mild diverticular change of the colon as well as changes of prior colectomy are seen. No diverticulitis is noted. The appendix is not well seen although no inflammatory changes are noted. The bladder is well distended. The uterus has been surgically removed. Degenerative changes of lumbar spine are noted. IMPRESSION: Ground-glass nodule in the left lower lobe stable from the prior exam and likely benign in etiology. Small bowel dilatation likely related to adhesions. No definitive mass lesion is seen. No other acute abnormality is noted. Electronically Signed: By: Alcide Clever M.D. On: 03/12/2015 12:17   Medications:  I have reviewed the patient's current medications Scheduled Meds: . enoxaparin (LOVENOX) injection  40 mg Subcutaneous Q24H  . levothyroxine  37.5 mcg Intravenous Daily  . potassium chloride  10 mEq Intravenous Q1 Hr x 6   Continuous Infusions: . dextrose 5 % and 0.45 % NaCl with KCl 20 mEq/L    . diltiazem (CARDIZEM) infusion Stopped (03/13/15 0920)   PRN Meds:.LORazepam, morphine injection, ondansetron **OR** ondansetron (ZOFRAN) IV   Assessment/Plan: #1. Partial small bowel obstruction. Observe following nasogastric tube placement. #2. Hypokalemia. Potassium dropped to 2.8. IV fluids have been modified and potassium runs have been started. #3. Bipolar disorder. Stable. #4. Dehydration resolved.  BUN and creatinine back to normal. #5. Tachycardia. Improved. #6. Hypothyroidism. Continue intravenous Synthroid. Principal Problem:   Partial small bowel obstruction (HCC) Active Problems:   Depression   Hypotension   Lactic acidosis   Dehydration   SBO (small bowel obstruction) (HCC)     LOS: 2 days   Wrangler Penning 03/14/2015, 10:13 AM

## 2015-03-14 NOTE — Progress Notes (Signed)
Subjective: Patient sedated having recently received Ativan.  Objective: Vital signs in last 24 hours: Temp:  [97 F (36.1 C)-98.7 F (37.1 C)] 97.7 F (36.5 C) (12/31 0752) Pulse Rate:  [89-122] 100 (12/31 0700) Resp:  [14-26] 20 (12/31 0700) BP: (129-176)/(37-104) 165/87 mmHg (12/31 0800) SpO2:  [93 %-99 %] 95 % (12/31 0700) Weight:  [57.7 kg (127 lb 3.3 oz)] 57.7 kg (127 lb 3.3 oz) (12/31 0400) Last BM Date: 03/12/15 (per patient)  Intake/Output from previous day: 12/30 0701 - 12/31 0700 In: 4059.6 [I.V.:3659.6; IV Piggyback:400] Out: -  Intake/Output this shift: Total I/O In: 250 [I.V.:150; IV Piggyback:100] Out: -   General appearance: cooperative and fatigued GI: Abdomen was soft distended. I placed a 21 French NG tube and got 2 L of fluid. Patient's abdomen softened remarkably. No tenderness is noted. No rigidity is noted.  Lab Results:   Recent Labs  03/12/15 1020 03/12/15 1029 03/13/15 0543  WBC 14.8*  --  7.8  HGB 18.3* 20.7* 13.8  HCT 53.5* 61.0* 41.4  PLT 222  --  145*   BMET  Recent Labs  03/13/15 0543 03/14/15 0400  NA 141 136  K 3.3* 2.8*  CL 114* 105  CO2 21* 21*  GLUCOSE 92 97  BUN 16 8  CREATININE 0.78 0.56  CALCIUM 7.8* 8.3*   PT/INR No results for input(s): LABPROT, INR in the last 72 hours.  Studies/Results: Ct Chest Wo Contrast  03/12/2015  ADDENDUM REPORT: 03/12/2015 12:27 ADDENDUM: The impression should read: Small bowel dilatation consistent with partial small bowel obstruction secondary to adhesions. No definitive mass lesion is noted. Electronically Signed   By: Alcide Clever M.D.   On: 03/12/2015 12:27  03/12/2015  CLINICAL DATA:  Shortness of breath and abdominal pain EXAM: CT CHEST, ABDOMEN AND PELVIS WITHOUT CONTRAST TECHNIQUE: Multidetector CT imaging of the chest, abdomen and pelvis was performed following the standard protocol without IV contrast. COMPARISON:  05/10/2006 FINDINGS: CT CHEST Lungs are well aerated  bilaterally. No focal confluent infiltrate or sizable effusion is noted. A ground-glass nodule is noted in the left lower lobe best seen on image number 27 of series 6. This is stable from the prior exam. No other focal nodules are seen. The thoracic aorta show some calcifications without aneurysmal dilatation. No hilar or mediastinal adenopathy is noted. Mild fluid is noted in the distal esophagus which may be related to reflux. Small hiatal hernia is noted. No acute bony abnormality is seen. CT ABDOMEN AND PELVIS The liver, gallbladder, spleen, adrenal glands and pancreas are within normal limits. The kidneys are well visualized bilaterally without evidence of renal calculi or urinary tract obstructive changes. Diffuse small bowel dilatation is noted with a focal area of relatively smooth transition in the anterior abdomen best seen on image number 82 of series 2. There postsurgical changes in the anterior abdominal wall on these changes are likely related to adhesions as no definitive mass lesion is seen. More distal small bowel is within normal limits. Mild diverticular change of the colon as well as changes of prior colectomy are seen. No diverticulitis is noted. The appendix is not well seen although no inflammatory changes are noted. The bladder is well distended. The uterus has been surgically removed. Degenerative changes of lumbar spine are noted. IMPRESSION: Ground-glass nodule in the left lower lobe stable from the prior exam and likely benign in etiology. Small bowel dilatation likely related to adhesions. No definitive mass lesion is seen. No other acute abnormality is  noted. Electronically Signed: By: Alcide Clever M.D. On: 03/12/2015 12:17   Dg Chest Port 1 View  03/12/2015  CLINICAL DATA:  Shortness of breath for 2 days. EXAM: PORTABLE CHEST 1 VIEW COMPARISON:  04/25/2006. FINDINGS: The cardiac silhouette, mediastinal and hilar contours are within normal limits and stable. Mild tortuosity and  calcification of the thoracic aorta. The lungs are clear. Suspect emphysematous changes. No pleural effusion. The bony thorax is intact. IMPRESSION: No acute cardiopulmonary findings. Electronically Signed   By: Rudie Meyer M.D.   On: 03/12/2015 11:10   Ct Renal Stone Study  03/12/2015  ADDENDUM REPORT: 03/12/2015 12:27 ADDENDUM: The impression should read: Small bowel dilatation consistent with partial small bowel obstruction secondary to adhesions. No definitive mass lesion is noted. Electronically Signed   By: Alcide Clever M.D.   On: 03/12/2015 12:27  03/12/2015  CLINICAL DATA:  Shortness of breath and abdominal pain EXAM: CT CHEST, ABDOMEN AND PELVIS WITHOUT CONTRAST TECHNIQUE: Multidetector CT imaging of the chest, abdomen and pelvis was performed following the standard protocol without IV contrast. COMPARISON:  05/10/2006 FINDINGS: CT CHEST Lungs are well aerated bilaterally. No focal confluent infiltrate or sizable effusion is noted. A ground-glass nodule is noted in the left lower lobe best seen on image number 27 of series 6. This is stable from the prior exam. No other focal nodules are seen. The thoracic aorta show some calcifications without aneurysmal dilatation. No hilar or mediastinal adenopathy is noted. Mild fluid is noted in the distal esophagus which may be related to reflux. Small hiatal hernia is noted. No acute bony abnormality is seen. CT ABDOMEN AND PELVIS The liver, gallbladder, spleen, adrenal glands and pancreas are within normal limits. The kidneys are well visualized bilaterally without evidence of renal calculi or urinary tract obstructive changes. Diffuse small bowel dilatation is noted with a focal area of relatively smooth transition in the anterior abdomen best seen on image number 82 of series 2. There postsurgical changes in the anterior abdominal wall on these changes are likely related to adhesions as no definitive mass lesion is seen. More distal small bowel is within  normal limits. Mild diverticular change of the colon as well as changes of prior colectomy are seen. No diverticulitis is noted. The appendix is not well seen although no inflammatory changes are noted. The bladder is well distended. The uterus has been surgically removed. Degenerative changes of lumbar spine are noted. IMPRESSION: Ground-glass nodule in the left lower lobe stable from the prior exam and likely benign in etiology. Small bowel dilatation likely related to adhesions. No definitive mass lesion is seen. No other acute abnormality is noted. Electronically Signed: By: Alcide Clever M.D. On: 03/12/2015 12:17    Anti-infectives: Anti-infectives    Start     Dose/Rate Route Frequency Ordered Stop   03/14/15 0600  vancomycin (VANCOCIN) IVPB 750 mg/150 ml premix  Status:  Discontinued     750 mg 150 mL/hr over 60 Minutes Intravenous Every 48 hours 03/12/15 1427 03/12/15 1633   03/13/15 1100  Levofloxacin (LEVAQUIN) IVPB 250 mg  Status:  Discontinued     250 mg 50 mL/hr over 60 Minutes Intravenous Every 24 hours 03/12/15 1427 03/12/15 1633   03/12/15 2000  aztreonam (AZACTAM) 1 g in dextrose 5 % 50 mL IVPB  Status:  Discontinued     1 g 100 mL/hr over 30 Minutes Intravenous Every 8 hours 03/12/15 1427 03/12/15 1633   03/12/15 1015  levofloxacin (LEVAQUIN) IVPB 750 mg  750 mg 100 mL/hr over 90 Minutes Intravenous  Once 03/12/15 1014 03/12/15 1151   03/12/15 1015  aztreonam (AZACTAM) 2 g in dextrose 5 % 50 mL IVPB     2 g 100 mL/hr over 30 Minutes Intravenous  Once 03/12/15 1014 03/12/15 1057   03/12/15 1015  vancomycin (VANCOCIN) IVPB 1000 mg/200 mL premix     1,000 mg 200 mL/hr over 60 Minutes Intravenous  Once 03/12/15 1014 03/12/15 1156      Assessment/Plan: Impression: Small bowel obstruction, now with NG tube decompression. Hypokalemia. Plan: New NG tube impression reassess in a.m. Hypokalemia is being addressed.  LOS: 2 days    Willer Osorno A 03/14/2015

## 2015-03-15 LAB — CBC
HEMATOCRIT: 34.8 % — AB (ref 36.0–46.0)
HEMOGLOBIN: 11.7 g/dL — AB (ref 12.0–15.0)
MCH: 32.8 pg (ref 26.0–34.0)
MCHC: 33.6 g/dL (ref 30.0–36.0)
MCV: 97.5 fL (ref 78.0–100.0)
Platelets: 164 10*3/uL (ref 150–400)
RBC: 3.57 MIL/uL — ABNORMAL LOW (ref 3.87–5.11)
RDW: 12.8 % (ref 11.5–15.5)
WBC: 6 10*3/uL (ref 4.0–10.5)

## 2015-03-15 LAB — BASIC METABOLIC PANEL
ANION GAP: 4 — AB (ref 5–15)
BUN: 5 mg/dL — ABNORMAL LOW (ref 6–20)
CALCIUM: 8.2 mg/dL — AB (ref 8.9–10.3)
CHLORIDE: 111 mmol/L (ref 101–111)
CO2: 28 mmol/L (ref 22–32)
Creatinine, Ser: 0.57 mg/dL (ref 0.44–1.00)
GFR calc Af Amer: >60 mL/min (ref >60)
GFR calc non Af Amer: >60 mL/min (ref >60)
GLUCOSE: 134 mg/dL — AB (ref 65–99)
Potassium: 3.5 mmol/L (ref 3.5–5.1)
Sodium: 143 mmol/L (ref 135–145)

## 2015-03-15 LAB — PHOSPHORUS: Phosphorus: 1.1 mg/dL — ABNORMAL LOW (ref 2.5–4.6)

## 2015-03-15 LAB — MAGNESIUM: Magnesium: 1.7 mg/dL (ref 1.7–2.4)

## 2015-03-15 MED ORDER — POTASSIUM PHOSPHATES 15 MMOLE/5ML IV SOLN
20.0000 mmol | Freq: Once | INTRAVENOUS | Status: AC
  Administered 2015-03-15: 20 mmol via INTRAVENOUS
  Filled 2015-03-15: qty 6.67

## 2015-03-15 NOTE — Progress Notes (Signed)
Subjective: Erica [name] has had over 2 L of nasogastric tube drainage now. She denies any flatus or any bowel movements. She feels pressure in her abdomen.  Objective: Vital signs in last 24 hours: Filed Vitals:   03/15/15 0300 03/15/15 0500 03/15/15 0600 03/15/15 0801  BP: 128/73  106/76   Pulse: 93  94   Temp:  98.3 F (36.8 C)  97.5 F (36.4 C)  TempSrc:  Axillary  Oral  Resp: 14  14   Height:      Weight:  122 lb 12.7 oz (55.7 kg)    SpO2: 95%  96%    Weight change: -4 lb 6.6 oz (-2 kg)  Intake/Output Summary (Last 24 hours) at 03/15/15 0945 Last data filed at 03/15/15 0600  Gross per 24 hour  Intake 2252.5 ml  Output   3075 ml  Net -822.5 ml    Physical Exam: Alert. No distress. Lungs clear. Heart tachycardic at 105. Abdomen is soft and minimally tender. No palpable organomegaly. Bowel sounds are present. Extremities reveal no edema.  Lab Results:    Results for orders placed or performed during the hospital encounter of 03/12/15 (from the past 24 hour(s))  CBC     Status: Abnormal   Collection Time: 03/15/15  5:53 AM  Result Value Ref Range   WBC 6.0 4.0 - 10.5 K/uL   RBC 3.57 (L) 3.87 - 5.11 MIL/uL   Hemoglobin 11.7 (L) 12.0 - 15.0 g/dL   HCT 16.0 (L) 73.7 - 10.6 %   MCV 97.5 78.0 - 100.0 fL   MCH 32.8 26.0 - 34.0 pg   MCHC 33.6 30.0 - 36.0 g/dL   RDW 26.9 48.5 - 46.2 %   Platelets 164 150 - 400 K/uL  Basic metabolic panel     Status: Abnormal   Collection Time: 03/15/15  5:53 AM  Result Value Ref Range   Sodium 143 135 - 145 mmol/L   Potassium 3.5 3.5 - 5.1 mmol/L   Chloride 111 101 - 111 mmol/L   CO2 28 22 - 32 mmol/L   Glucose, Bld 134 (H) 65 - 99 mg/dL   BUN 5 (L) 6 - 20 mg/dL   Creatinine, Ser 7.03 0.44 - 1.00 mg/dL   Calcium 8.2 (L) 8.9 - 10.3 mg/dL   GFR calc non Af Amer >60 >60 mL/min   GFR calc Af Amer >60 >60 mL/min   Anion gap 4 (L) 5 - 15  Magnesium     Status: None   Collection Time: 03/15/15  5:53 AM  Result Value Ref Range   Magnesium  1.7 1.7 - 2.4 mg/dL  Phosphorus     Status: Abnormal   Collection Time: 03/15/15  5:53 AM  Result Value Ref Range   Phosphorus 1.1 (L) 2.5 - 4.6 mg/dL     ABGS  Recent Labs  03/12/15 1029  TCO2 19   CULTURES Recent Results (from the past 240 hour(s))  Blood Culture (routine x 2)     Status: None (Preliminary result)   Collection Time: 03/12/15 10:41 AM  Result Value Ref Range Status   Specimen Description BLOOD RIGHT HAND  Final   Special Requests BOTTLES DRAWN AEROBIC ONLY 4CC  Final   Culture NO GROWTH 2 DAYS  Final   Report Status PENDING  Incomplete  Blood Culture (routine x 2)     Status: None (Preliminary result)   Collection Time: 03/12/15 11:45 AM  Result Value Ref Range Status   Specimen Description BLOOD LEFT HAND  Final   Special Requests BOTTLES DRAWN AEROBIC ONLY 4CC  Final   Culture NO GROWTH 2 DAYS  Final   Report Status PENDING  Incomplete  Urine culture     Status: None   Collection Time: 03/12/15 12:00 PM  Result Value Ref Range Status   Specimen Description URINE, CATHETERIZED  Final   Special Requests NONE  Final   Culture   Final    NO GROWTH 1 DAY Performed at [facility]    Report Status 03/13/2015 FINAL  Final  MRSA PCR Screening     Status: None   Collection Time: 03/12/15  2:20 PM  Result Value Ref Range Status   MRSA by PCR NEGATIVE NEGATIVE Final    Comment:        The GeneXpert MRSA Assay (FDA approved for NASAL specimens only), is one component of a comprehensive MRSA colonization surveillance program. It is not intended to diagnose MRSA infection nor to guide or monitor treatment for MRSA infections.    Studies/Results: No results found. Micro Results: Recent Results (from the past 240 hour(s))  Blood Culture (routine x 2)     Status: None (Preliminary result)   Collection Time: 03/12/15 10:41 AM  Result Value Ref Range Status   Specimen Description BLOOD RIGHT HAND  Final   Special Requests BOTTLES DRAWN AEROBIC  ONLY 4CC  Final   Culture NO GROWTH 2 DAYS  Final   Report Status PENDING  Incomplete  Blood Culture (routine x 2)     Status: None (Preliminary result)   Collection Time: 03/12/15 11:45 AM  Result Value Ref Range Status   Specimen Description BLOOD LEFT HAND  Final   Special Requests BOTTLES DRAWN AEROBIC ONLY 4CC  Final   Culture NO GROWTH 2 DAYS  Final   Report Status PENDING  Incomplete  Urine culture     Status: None   Collection Time: 03/12/15 12:00 PM  Result Value Ref Range Status   Specimen Description URINE, CATHETERIZED  Final   Special Requests NONE  Final   Culture   Final    NO GROWTH 1 DAY Performed at [facility]    Report Status 03/13/2015 FINAL  Final  MRSA PCR Screening     Status: None   Collection Time: 03/12/15  2:20 PM  Result Value Ref Range Status   MRSA by PCR NEGATIVE NEGATIVE Final    Comment:        The GeneXpert MRSA Assay (FDA approved for NASAL specimens only), is one component of a comprehensive MRSA colonization surveillance program. It is not intended to diagnose MRSA infection nor to guide or monitor treatment for MRSA infections.    Studies/Results: No results found. Medications:  I have reviewed the patient's current medications Scheduled Meds: . enoxaparin (LOVENOX) injection  40 mg Subcutaneous Q24H  . levothyroxine  37.5 mcg Intravenous Daily   Continuous Infusions: . dextrose 5 % and 0.45 % NaCl with KCl 20 mEq/L 100 mL/hr at 03/14/15 2240   PRN Meds:.LORazepam, morphine injection, ondansetron **OR** ondansetron (ZOFRAN) IV   Assessment/Plan: #1. Partial small bowel obstruction. Continue nasogastric suction pending Dr. Lovell Sheehan visit this morning. #2. Hypokalemia. Improved. Potassium is up to 2.5. Continue IV potassium. #3. Hypophosphatemia. Phosphorus is 1.1. Supplement intravenously. #4. Dehydration. BUN and creatinine have responded favorably to IV fluids. Creatinine is down to 0.5. #5. Tachycardia. Improved.  Euthyroid.  Blood cultures are negative. Urine culture is negative.  Principal Problem:   Partial small bowel obstruction (  HCC) Active Problems:   Depression   Hypotension   Lactic acidosis   Dehydration   SBO (small bowel obstruction) (HCC)     LOS: 3 days   FAGAN,ROY 03/15/2015, 9:45 AM

## 2015-03-15 NOTE — Progress Notes (Signed)
  Subjective: Patient feels some fullness in her abdomen. No flatus or bowel movement yet.  Objective: Vital signs in last 24 hours: Temp:  [96.7 F (35.9 C)-98.3 F (36.8 C)] 97.5 F (36.4 C) (01/01 0801) Pulse Rate:  [90-103] 94 (01/01 0600) Resp:  [14-26] 14 (01/01 0600) BP: (106-171)/(68-101) 106/76 mmHg (01/01 0600) SpO2:  [94 %-98 %] 96 % (01/01 0600) Weight:  [55.7 kg (122 lb 12.7 oz)] 55.7 kg (122 lb 12.7 oz) (01/01 0500) Last BM Date: 04/12/15  Intake/Output from previous day: 12/31 0701 - 01/01 0700 In: 2752.5 [I.V.:2452.5; IV Piggyback:300] Out: 3075 [Urine:1075; Emesis/NG output:2000] Intake/Output this shift:    General appearance: alert, cooperative and no distress GI: Soft, slightly distended but no rigidity or tenderness are noted. Minimal bowel sounds appreciated.  Lab Results:   Recent Labs  03/13/15 0543 03/15/15 0553  WBC 7.8 6.0  HGB 13.8 11.7*  HCT 41.4 34.8*  PLT 145* 164   BMET  Recent Labs  03/14/15 0400 03/15/15 0553  NA 136 143  K 2.8* 3.5  CL 105 111  CO2 21* 28  GLUCOSE 97 134*  BUN 8 5*  CREATININE 0.56 0.57  CALCIUM 8.3* 8.2*   PT/INR No results for input(s): LABPROT, INR in the last 72 hours.  Studies/Results: No results found.  Anti-infectives: Anti-infectives    Start     Dose/Rate Route Frequency Ordered Stop   03/14/15 0600  vancomycin (VANCOCIN) IVPB 750 mg/150 ml premix  Status:  Discontinued     750 mg 150 mL/hr over 60 Minutes Intravenous Every 48 hours 03/12/15 1427 03/12/15 1633   03/13/15 1100  Levofloxacin (LEVAQUIN) IVPB 250 mg  Status:  Discontinued     250 mg 50 mL/hr over 60 Minutes Intravenous Every 24 hours 03/12/15 1427 03/12/15 1633   03/12/15 2000  aztreonam (AZACTAM) 1 g in dextrose 5 % 50 mL IVPB  Status:  Discontinued     1 g 100 mL/hr over 30 Minutes Intravenous Every 8 hours 03/12/15 1427 03/12/15 1633   03/12/15 1015  levofloxacin (LEVAQUIN) IVPB 750 mg     750 mg 100 mL/hr over 90  Minutes Intravenous  Once 03/12/15 1014 03/12/15 1151   03/12/15 1015  aztreonam (AZACTAM) 2 g in dextrose 5 % 50 mL IVPB     2 g 100 mL/hr over 30 Minutes Intravenous  Once 03/12/15 1014 03/12/15 1057   03/12/15 1015  vancomycin (VANCOCIN) IVPB 1000 mg/200 mL premix     1,000 mg 200 mL/hr over 60 Minutes Intravenous  Once 03/12/15 1014 03/12/15 1156      Assessment/Plan: Impression: Small bowel obstruction. Hypophosphatemia noted. Continue NG tube decompression at this time. We'll get patient up to chair. No need for acute surgical intervention today.  LOS: 3 days    Erica [name] 03/15/2015

## 2015-03-16 LAB — BASIC METABOLIC PANEL
ANION GAP: 9 (ref 5–15)
BUN: <5 mg/dL — ABNORMAL LOW (ref 6–20)
CALCIUM: 8.6 mg/dL — AB (ref 8.9–10.3)
CO2: 30 mmol/L (ref 22–32)
Chloride: 105 mmol/L (ref 101–111)
Creatinine, Ser: 0.69 mg/dL (ref 0.44–1.00)
GLUCOSE: 126 mg/dL — AB (ref 65–99)
POTASSIUM: 3.5 mmol/L (ref 3.5–5.1)
Sodium: 144 mmol/L (ref 135–145)

## 2015-03-16 LAB — PHOSPHORUS: Phosphorus: 2.1 mg/dL — ABNORMAL LOW (ref 2.5–4.6)

## 2015-03-16 MED ORDER — POTASSIUM PHOSPHATES 15 MMOLE/5ML IV SOLN
20.0000 mmol | Freq: Once | INTRAVENOUS | Status: AC
  Administered 2015-03-16: 20 mmol via INTRAVENOUS
  Filled 2015-03-16: qty 6.67

## 2015-03-16 NOTE — Progress Notes (Signed)
eLink Physician-Brief Progress Note Patient Name: Erica [name] DOB: 08-13-1932 MRN: [id number]   Date of Service  03/16/2015  HPI/Events of Note  Urinary retention.  eICU Interventions  Will order Foley catheter placed.      Intervention Category Intermediate Interventions: Oliguria - evaluation and management  [name] 03/16/2015, 5:12 AM

## 2015-03-16 NOTE — Progress Notes (Signed)
Subjective: Erica [name] denies any abdominal pain. She has pain with gas this morning. She has not had a bowel movement. She had urinary retention overnight with a post void residual of 900 mL requiring Foley catheter placement. Nasogastric tube drainage has decreased.  Objective: Vital signs in last 24 hours: Filed Vitals:   03/16/15 0600 03/16/15 0700 03/16/15 0747 03/16/15 0800  BP: 146/76 163/83  173/91  Pulse: 109 99  111  Temp:   98.4 F (36.9 C)   TempSrc:   Oral   Resp: 13 21  21   Height:      Weight:      SpO2: 96% 96%  96%   Weight change: -4 lb 6.6 oz (-2 kg)  Intake/Output Summary (Last 24 hours) at 03/16/15 1004 Last data filed at 03/16/15 0918  Gross per 24 hour  Intake   2100 ml  Output   2675 ml  Net   -575 ml    Physical Exam: Alert. No distress. Lungs clear. Heart regular in the 190s and low 100s. Abdomen is soft and nontender. Bowel sounds are present. Extremities reveal no edema.  Lab Results:    Results for orders placed or performed during the hospital encounter of 03/12/15 (from the past 24 hour(s))  Phosphorus     Status: Abnormal   Collection Time: 03/16/15  4:45 AM  Result Value Ref Range   Phosphorus 2.1 (L) 2.5 - 4.6 mg/dL  Basic metabolic panel     Status: Abnormal   Collection Time: 03/16/15  4:45 AM  Result Value Ref Range   Sodium 144 135 - 145 mmol/L   Potassium 3.5 3.5 - 5.1 mmol/L   Chloride 105 101 - 111 mmol/L   CO2 30 22 - 32 mmol/L   Glucose, Bld 126 (H) 65 - 99 mg/dL   BUN <5 (L) 6 - 20 mg/dL   Creatinine, Ser 05/14/15 0.44 - 1.00 mg/dL   Calcium 8.6 (L) 8.9 - 10.3 mg/dL   GFR calc non Af Amer >60 >60 mL/min   GFR calc Af Amer >60 >60 mL/min   Anion gap 9 5 - 15     ABGS No results for input(s): PHART, PO2ART, TCO2, HCO3 in the last 72 hours.  Invalid input(s): PCO2 CULTURES Recent Results (from the past 240 hour(s))  Blood Culture (routine x 2)     Status: None (Preliminary result)   Collection Time: 03/12/15 10:41 AM   Result Value Ref Range Status   Specimen Description BLOOD RIGHT HAND  Final   Special Requests BOTTLES DRAWN AEROBIC ONLY 4CC  Final   Culture NO GROWTH 2 DAYS  Final   Report Status PENDING  Incomplete  Blood Culture (routine x 2)     Status: None (Preliminary result)   Collection Time: 03/12/15 11:45 AM  Result Value Ref Range Status   Specimen Description BLOOD LEFT HAND  Final   Special Requests BOTTLES DRAWN AEROBIC ONLY 4CC  Final   Culture NO GROWTH 2 DAYS  Final   Report Status PENDING  Incomplete  Urine culture     Status: None   Collection Time: 03/12/15 12:00 PM  Result Value Ref Range Status   Specimen Description URINE, CATHETERIZED  Final   Special Requests NONE  Final   Culture   Final    NO GROWTH 1 DAY Performed at [facility]    Report Status 03/13/2015 FINAL  Final  MRSA PCR Screening     Status: None   Collection Time: 03/12/15  2:20 PM  Result Value Ref Range Status   MRSA by PCR NEGATIVE NEGATIVE Final    Comment:        The GeneXpert MRSA Assay (FDA approved for NASAL specimens only), is one component of a comprehensive MRSA colonization surveillance program. It is not intended to diagnose MRSA infection nor to guide or monitor treatment for MRSA infections.    Studies/Results: No results found. Micro Results: Recent Results (from the past 240 hour(s))  Blood Culture (routine x 2)     Status: None (Preliminary result)   Collection Time: 03/12/15 10:41 AM  Result Value Ref Range Status   Specimen Description BLOOD RIGHT HAND  Final   Special Requests BOTTLES DRAWN AEROBIC ONLY 4CC  Final   Culture NO GROWTH 2 DAYS  Final   Report Status PENDING  Incomplete  Blood Culture (routine x 2)     Status: None (Preliminary result)   Collection Time: 03/12/15 11:45 AM  Result Value Ref Range Status   Specimen Description BLOOD LEFT HAND  Final   Special Requests BOTTLES DRAWN AEROBIC ONLY 4CC  Final   Culture NO GROWTH 2 DAYS  Final    Report Status PENDING  Incomplete  Urine culture     Status: None   Collection Time: 03/12/15 12:00 PM  Result Value Ref Range Status   Specimen Description URINE, CATHETERIZED  Final   Special Requests NONE  Final   Culture   Final    NO GROWTH 1 DAY Performed at [facility]    Report Status 03/13/2015 FINAL  Final  MRSA PCR Screening     Status: None   Collection Time: 03/12/15  2:20 PM  Result Value Ref Range Status   MRSA by PCR NEGATIVE NEGATIVE Final    Comment:        The GeneXpert MRSA Assay (FDA approved for NASAL specimens only), is one component of a comprehensive MRSA colonization surveillance program. It is not intended to diagnose MRSA infection nor to guide or monitor treatment for MRSA infections.    Studies/Results: No results found. Medications:  I have reviewed the patient's current medications Scheduled Meds: . enoxaparin (LOVENOX) injection  40 mg Subcutaneous Q24H  . levothyroxine  37.5 mcg Intravenous Daily  . potassium phosphate IVPB (mmol)  20 mmol Intravenous Once   Continuous Infusions: . dextrose 5 % and 0.45 % NaCl with KCl 20 mEq/L 100 mL/hr at 03/16/15 0700   PRN Meds:.LORazepam, morphine injection, ondansetron **OR** ondansetron (ZOFRAN) IV   Assessment/Plan: #1. Partial small bowel obstruction. Continue nasogastric suction. #2. Hypokalemia. Potassium 3.5. #3. Hypophosphatemia. Repeat potassium phosphate 20 mmol today. Improved at 2.1 now. #4. Depression. Stable. #5. Dehydration. Resolved. Creatinine is 0.6. #6. Rheumatoid arthritis. Stable. Methotrexate will be skipped this week. Principal Problem:   Partial small bowel obstruction (HCC) Active Problems:   Depression   Hypotension   Lactic acidosis   Dehydration   SBO (small bowel obstruction) (HCC)     LOS: 4 days   Teiara Baria 03/16/2015, 10:04 AM

## 2015-03-16 NOTE — Progress Notes (Signed)
  Subjective: Patient comfortable. No bowel movement or flatus yet.  Objective: Vital signs in last 24 hours: Temp:  [97.3 F (36.3 C)-98.4 F (36.9 C)] 98.4 F (36.9 C) (01/02 0747) Pulse Rate:  [91-131] 111 (01/02 0800) Resp:  [13-34] 21 (01/02 0800) BP: (133-194)/(67-147) 173/91 mmHg (01/02 0800) SpO2:  [89 %-98 %] 96 % (01/02 0800) Weight:  [53.7 kg (118 lb 6.2 oz)] 53.7 kg (118 lb 6.2 oz) (01/02 0500) Last BM Date: 03/13/15  Intake/Output from previous day: 01/01 0701 - 01/02 0700 In: 2400 [I.V.:2400] Out: 2375 [Urine:1675; Emesis/NG output:700] Intake/Output this shift:    General appearance: alert, cooperative and no distress GI: Soft, nontender, nondistended. Minimal bowel sounds appreciated.  Lab Results:   Recent Labs  03/15/15 0553  WBC 6.0  HGB 11.7*  HCT 34.8*  PLT 164   BMET  Recent Labs  03/15/15 0553 03/16/15 0445  NA 143 144  K 3.5 3.5  CL 111 105  CO2 28 30  GLUCOSE 134* 126*  BUN 5* <5*  CREATININE 0.57 0.69  CALCIUM 8.2* 8.6*   PT/INR No results for input(s): LABPROT, INR in the last 72 hours.  Studies/Results: No results found.  Anti-infectives: Anti-infectives    Start     Dose/Rate Route Frequency Ordered Stop   03/14/15 0600  vancomycin (VANCOCIN) IVPB 750 mg/150 ml premix  Status:  Discontinued     750 mg 150 mL/hr over 60 Minutes Intravenous Every 48 hours 03/12/15 1427 03/12/15 1633   03/13/15 1100  Levofloxacin (LEVAQUIN) IVPB 250 mg  Status:  Discontinued     250 mg 50 mL/hr over 60 Minutes Intravenous Every 24 hours 03/12/15 1427 03/12/15 1633   03/12/15 2000  aztreonam (AZACTAM) 1 g in dextrose 5 % 50 mL IVPB  Status:  Discontinued     1 g 100 mL/hr over 30 Minutes Intravenous Every 8 hours 03/12/15 1427 03/12/15 1633   03/12/15 1015  levofloxacin (LEVAQUIN) IVPB 750 mg     750 mg 100 mL/hr over 90 Minutes Intravenous  Once 03/12/15 1014 03/12/15 1151   03/12/15 1015  aztreonam (AZACTAM) 2 g in dextrose 5 % 50 mL  IVPB     2 g 100 mL/hr over 30 Minutes Intravenous  Once 03/12/15 1014 03/12/15 1057   03/12/15 1015  vancomycin (VANCOCIN) IVPB 1000 mg/200 mL premix     1,000 mg 200 mL/hr over 60 Minutes Intravenous  Once 03/12/15 1014 03/12/15 1156      Assessment/Plan: Impression: Small bowel obstruction. NG tube output has decreased, but is more bilious in nature. Plan: Continue NG tube for now. If patient doesn't progress over the next 24-48 hours, she may require exploratory laparotomy.  LOS: 4 days    Deion Forgue A 03/16/2015

## 2015-03-17 ENCOUNTER — Inpatient Hospital Stay (HOSPITAL_COMMUNITY): Payer: Medicare Other

## 2015-03-17 LAB — CULTURE, BLOOD (ROUTINE X 2)
CULTURE: NO GROWTH
Culture: NO GROWTH

## 2015-03-17 LAB — BASIC METABOLIC PANEL
ANION GAP: 8 (ref 5–15)
BUN: 5 mg/dL — AB (ref 6–20)
CALCIUM: 8.6 mg/dL — AB (ref 8.9–10.3)
CO2: 28 mmol/L (ref 22–32)
Chloride: 107 mmol/L (ref 101–111)
Creatinine, Ser: 0.65 mg/dL (ref 0.44–1.00)
GFR calc Af Amer: >60 mL/min (ref >60)
GLUCOSE: 136 mg/dL — AB (ref 65–99)
Potassium: 4 mmol/L (ref 3.5–5.1)
Sodium: 143 mmol/L (ref 135–145)

## 2015-03-17 LAB — PHOSPHORUS: Phosphorus: 3.4 mg/dL (ref 2.5–4.6)

## 2015-03-17 MED ORDER — IOHEXOL 300 MG/ML  SOLN
100.0000 mL | Freq: Once | INTRAMUSCULAR | Status: AC | PRN
  Administered 2015-03-17: 100 mL via INTRAVENOUS

## 2015-03-17 MED ORDER — CYCLOBENZAPRINE HCL 10 MG PO TABS
10.0000 mg | ORAL_TABLET | Freq: Three times a day (TID) | ORAL | Status: DC | PRN
  Administered 2015-03-17 – 2015-03-20 (×7): 10 mg via ORAL
  Filled 2015-03-17 (×7): qty 1

## 2015-03-17 NOTE — Progress Notes (Signed)
Subjective: Twilla has passed some gas but no stool. Her primary complaint is pain in her right thigh now. She has had 900 mL of NG output.  Objective: Vital signs in last 24 hours: Filed Vitals:   03/17/15 0300 03/17/15 0400 03/17/15 0500 03/17/15 0600  BP: 120/75 110/61 103/66 144/88  Pulse: 102 107 112 126  Temp:  98.4 F (36.9 C)    TempSrc:  Oral    Resp: 15 15    Height:      Weight:   120 lb 13 oz (54.8 kg)   SpO2: 92% 92% 94% 93%   Weight change: 2 lb 6.8 oz (1.1 kg)  Intake/Output Summary (Last 24 hours) at 03/17/15 0740 Last data filed at 03/17/15 0600  Gross per 24 hour  Intake 2806.67 ml  Output   3100 ml  Net -293.33 ml    Physical Exam: Alert. Lungs clear. Heart regular with no murmurs. Abdomen soft and nontender. Bowel sounds are present. The right thigh is not tender to palpation. The skin appears unremarkable. There is no calf tenderness or swelling.  Lab Results:    Results for orders placed or performed during the hospital encounter of 03/12/15 (from the past 24 hour(s))  Basic metabolic panel     Status: Abnormal   Collection Time: 03/17/15  5:51 AM  Result Value Ref Range   Sodium 143 135 - 145 mmol/L   Potassium 4.0 3.5 - 5.1 mmol/L   Chloride 107 101 - 111 mmol/L   CO2 28 22 - 32 mmol/L   Glucose, Bld 136 (H) 65 - 99 mg/dL   BUN 5 (L) 6 - 20 mg/dL   Creatinine, Ser 9.56 0.44 - 1.00 mg/dL   Calcium 8.6 (L) 8.9 - 10.3 mg/dL   GFR calc non Af Amer >60 >60 mL/min   GFR calc Af Amer >60 >60 mL/min   Anion gap 8 5 - 15  Phosphorus     Status: None   Collection Time: 03/17/15  5:51 AM  Result Value Ref Range   Phosphorus 3.4 2.5 - 4.6 mg/dL     ABGS No results for input(s): PHART, PO2ART, TCO2, HCO3 in the last 72 hours.  Invalid input(s): PCO2 CULTURES Recent Results (from the past 240 hour(s))  Blood Culture (routine x 2)     Status: None (Preliminary result)   Collection Time: 03/12/15 10:41 AM  Result Value Ref Range Status    Specimen Description BLOOD RIGHT HAND  Final   Special Requests BOTTLES DRAWN AEROBIC ONLY 4CC  Final   Culture NO GROWTH 4 DAYS  Final   Report Status PENDING  Incomplete  Blood Culture (routine x 2)     Status: None (Preliminary result)   Collection Time: 03/12/15 11:45 AM  Result Value Ref Range Status   Specimen Description BLOOD LEFT HAND  Final   Special Requests BOTTLES DRAWN AEROBIC ONLY 4CC  Final   Culture NO GROWTH 4 DAYS  Final   Report Status PENDING  Incomplete  Urine culture     Status: None   Collection Time: 03/12/15 12:00 PM  Result Value Ref Range Status   Specimen Description URINE, CATHETERIZED  Final   Special Requests NONE  Final   Culture   Final    NO GROWTH 1 DAY Performed at [facility]    Report Status 03/13/2015 FINAL  Final  MRSA PCR Screening     Status: None   Collection Time: 03/12/15  2:20 PM  Result Value  Ref Range Status   MRSA by PCR NEGATIVE NEGATIVE Final    Comment:        The GeneXpert MRSA Assay (FDA approved for NASAL specimens only), is one component of a comprehensive MRSA colonization surveillance program. It is not intended to diagnose MRSA infection nor to guide or monitor treatment for MRSA infections.    Studies/Results: No results found. Micro Results: Recent Results (from the past 240 hour(s))  Blood Culture (routine x 2)     Status: None (Preliminary result)   Collection Time: 03/12/15 10:41 AM  Result Value Ref Range Status   Specimen Description BLOOD RIGHT HAND  Final   Special Requests BOTTLES DRAWN AEROBIC ONLY 4CC  Final   Culture NO GROWTH 4 DAYS  Final   Report Status PENDING  Incomplete  Blood Culture (routine x 2)     Status: None (Preliminary result)   Collection Time: 03/12/15 11:45 AM  Result Value Ref Range Status   Specimen Description BLOOD LEFT HAND  Final   Special Requests BOTTLES DRAWN AEROBIC ONLY 4CC  Final   Culture NO GROWTH 4 DAYS  Final   Report Status PENDING  Incomplete   Urine culture     Status: None   Collection Time: 03/12/15 12:00 PM  Result Value Ref Range Status   Specimen Description URINE, CATHETERIZED  Final   Special Requests NONE  Final   Culture   Final    NO GROWTH 1 DAY Performed at [facility]    Report Status 03/13/2015 FINAL  Final  MRSA PCR Screening     Status: None   Collection Time: 03/12/15  2:20 PM  Result Value Ref Range Status   MRSA by PCR NEGATIVE NEGATIVE Final    Comment:        The GeneXpert MRSA Assay (FDA approved for NASAL specimens only), is one component of a comprehensive MRSA colonization surveillance program. It is not intended to diagnose MRSA infection nor to guide or monitor treatment for MRSA infections.    Studies/Results: No results found. Medications:  I have reviewed the patient's current medications Scheduled Meds: . enoxaparin (LOVENOX) injection  40 mg Subcutaneous Q24H  . levothyroxine  37.5 mcg Intravenous Daily   Continuous Infusions: . dextrose 5 % and 0.45 % NaCl with KCl 20 mEq/L 100 mL/hr at 03/17/15 0600   PRN Meds:.LORazepam, morphine injection, ondansetron **OR** ondansetron (ZOFRAN) IV   Assessment/Plan: #1. Partial small bowel obstruction. Will discuss further with Dr. Lovell Sheehan this morning regarding clamping the NG versus continued suction. #2. Dehydration. Resolved. BUN and creatinine and electrolytes are normal. Hypophosphatemia has resolved with supplementation. #3. Depression. Stable. Principal Problem:   Partial small bowel obstruction (HCC) Active Problems:   Depression   Hypotension   Lactic acidosis   Dehydration   SBO (small bowel obstruction) (HCC)     LOS: 5 days   Shaunette Gassner 03/17/2015, 7:40 AM

## 2015-03-17 NOTE — Progress Notes (Signed)
Patient continues to c/o cramping in right thigh. Little relief noted from Morphine. Patient assisted to recliner and heat packs applied to right thigh. Notified Dr. Ouida Sills of patient's continue to c/o right thigh cramping. New order given.

## 2015-03-17 NOTE — Progress Notes (Signed)
Gastric tube removed. Full liquid diet ordered.

## 2015-03-17 NOTE — Progress Notes (Signed)
  Subjective: Patient comfortable. Has not had a large bowel movement yet. Denies any abdominal pain.  Objective: Vital signs in last 24 hours: Temp:  [97.6 F (36.4 C)-98.4 F (36.9 C)] 98.4 F (36.9 C) (01/03 0400) Pulse Rate:  [101-126] 103 (01/03 0800) Resp:  [11-30] 16 (01/03 0800) BP: (103-156)/(61-92) 130/77 mmHg (01/03 0800) SpO2:  [90 %-97 %] 91 % (01/03 0800) Weight:  [54.8 kg (120 lb 13 oz)] 54.8 kg (120 lb 13 oz) (01/03 0500) Last BM Date: 03/13/15  Intake/Output from previous day: 01/02 0701 - 01/03 0700 In: 2806.7 [I.V.:2300; IV Piggyback:506.7] Out: 3100 [Urine:2200; Emesis/NG output:900] Intake/Output this shift:    General appearance: cooperative and no distress GI: Soft flat. Minimal bowel sounds appreciated. Nontender. Nondistended. No rigidity noted.  Lab Results:   Recent Labs  03/15/15 0553  WBC 6.0  HGB 11.7*  HCT 34.8*  PLT 164   BMET  Recent Labs  03/16/15 0445 03/17/15 0551  NA 144 143  K 3.5 4.0  CL 105 107  CO2 30 28  GLUCOSE 126* 136*  BUN <5* 5*  CREATININE 0.69 0.65  CALCIUM 8.6* 8.6*   PT/INR No results for input(s): LABPROT, INR in the last 72 hours.  Studies/Results: No results found.  Anti-infectives: Anti-infectives    Start     Dose/Rate Route Frequency Ordered Stop   03/14/15 0600  vancomycin (VANCOCIN) IVPB 750 mg/150 ml premix  Status:  Discontinued     750 mg 150 mL/hr over 60 Minutes Intravenous Every 48 hours 03/12/15 1427 03/12/15 1633   03/13/15 1100  Levofloxacin (LEVAQUIN) IVPB 250 mg  Status:  Discontinued     250 mg 50 mL/hr over 60 Minutes Intravenous Every 24 hours 03/12/15 1427 03/12/15 1633   03/12/15 2000  aztreonam (AZACTAM) 1 g in dextrose 5 % 50 mL IVPB  Status:  Discontinued     1 g 100 mL/hr over 30 Minutes Intravenous Every 8 hours 03/12/15 1427 03/12/15 1633   03/12/15 1015  levofloxacin (LEVAQUIN) IVPB 750 mg     750 mg 100 mL/hr over 90 Minutes Intravenous  Once 03/12/15 1014  03/12/15 1151   03/12/15 1015  aztreonam (AZACTAM) 2 g in dextrose 5 % 50 mL IVPB     2 g 100 mL/hr over 30 Minutes Intravenous  Once 03/12/15 1014 03/12/15 1057   03/12/15 1015  vancomycin (VANCOCIN) IVPB 1000 mg/200 mL premix     1,000 mg 200 mL/hr over 60 Minutes Intravenous  Once 03/12/15 1014 03/12/15 1156      Assessment/Plan: Impression: Small bowel obstruction. Still with moderate NG tube output. Will get CT scan of the abdomen with contrast to assess obstruction. Further management is pending those results.  LOS: 5 days    Uva Runkel A 03/17/2015

## 2015-03-17 NOTE — Plan of Care (Signed)
Problem: Activity: Goal: Risk for activity intolerance will decrease Outcome: Progressing Ambulated to recliner. Tolerated sitting up in the chair

## 2015-03-17 NOTE — Care Management Note (Signed)
Case Management Note  Patient Details  Name: JULITA OZBUN MRN: [id number] Date of Birth: 1933/02/27   Expected Discharge Date:  03/14/15               Expected Discharge Plan:  Home/Self Care  In-House Referral:  NA  Discharge planning Services  CM Consult  Post Acute Care Choice:  NA Choice offered to:  NA  DME Arranged:    DME Agency:     HH Arranged:    HH Agency:     Status of Service:  Completed, signed off  Medicare Important Message Given:  Yes Date Medicare IM Given:    Medicare IM give by:    Date Additional Medicare IM Given:    Additional Medicare Important Message give by:     If discussed at Long Length of Stay Meetings, dates discussed:    Additional Comments:  Malcolm Metro, RN 03/17/2015, 3:37 PM

## 2015-03-18 MED ORDER — LEVOTHYROXINE SODIUM 75 MCG PO TABS
75.0000 ug | ORAL_TABLET | Freq: Every day | ORAL | Status: DC
  Administered 2015-03-18 – 2015-03-20 (×3): 75 ug via ORAL
  Filled 2015-03-18 (×3): qty 1

## 2015-03-18 MED ORDER — POLYETHYLENE GLYCOL 3350 17 G PO PACK
17.0000 g | PACK | Freq: Two times a day (BID) | ORAL | Status: DC
  Administered 2015-03-18 – 2015-03-20 (×4): 17 g via ORAL
  Filled 2015-03-18 (×5): qty 1

## 2015-03-18 NOTE — Progress Notes (Signed)
Subjective: Erica [name] tolerated full liquids for supper last night. Her CT showed no obstruction. She denies any nausea or vomiting. She denies any bowel movements or gas also. She is not having abdominal pain. She continues to complain of right thigh pain. She was treated with a muscle relaxer yesterday.  Objective: Vital signs in last 24 hours: Filed Vitals:   03/18/15 0300 03/18/15 0400 03/18/15 0500 03/18/15 0600  BP: 124/76 136/74 115/75 118/72  Pulse: 104 109 111 106  Temp:  98.2 F (36.8 C)    TempSrc:  Oral    Resp: 15 13 15 14   Height:      Weight:    120 lb 5.9 oz (54.6 kg)  SpO2: 94% 89% 91% 92%   Weight change: -7.1 oz (-0.2 kg)  Intake/Output Summary (Last 24 hours) at 03/18/15 0736 Last data filed at 03/18/15 0500  Gross per 24 hour  Intake   1780 ml  Output   3250 ml  Net  -1470 ml    Physical Exam: Alert. No distress. Tachycardic in the 110 range. Lungs clear. Heart regular with no murmurs. Abdomen soft and nontender with active bowel sounds. Extremities reveal no edema.  Lab Results:   No results found for this or any previous visit (from the past 24 hour(s)).   ABGS No results for input(s): PHART, PO2ART, TCO2, HCO3 in the last 72 hours.  Invalid input(s): PCO2 CULTURES Recent Results (from the past 240 hour(s))  Blood Culture (routine x 2)     Status: None   Collection Time: 03/12/15 10:41 AM  Result Value Ref Range Status   Specimen Description BLOOD RIGHT HAND  Final   Special Requests BOTTLES DRAWN AEROBIC ONLY 4CC  Final   Culture NO GROWTH 5 DAYS  Final   Report Status 03/17/2015 FINAL  Final  Blood Culture (routine x 2)     Status: None   Collection Time: 03/12/15 11:45 AM  Result Value Ref Range Status   Specimen Description BLOOD LEFT HAND  Final   Special Requests BOTTLES DRAWN AEROBIC ONLY 4CC  Final   Culture NO GROWTH 5 DAYS  Final   Report Status 03/17/2015 FINAL  Final  Urine culture     Status: None   Collection Time: 03/12/15  12:00 PM  Result Value Ref Range Status   Specimen Description URINE, CATHETERIZED  Final   Special Requests NONE  Final   Culture   Final    NO GROWTH 1 DAY Performed at [facility]    Report Status 03/13/2015 FINAL  Final  MRSA PCR Screening     Status: None   Collection Time: 03/12/15  2:20 PM  Result Value Ref Range Status   MRSA by PCR NEGATIVE NEGATIVE Final    Comment:        The GeneXpert MRSA Assay (FDA approved for NASAL specimens only), is one component of a comprehensive MRSA colonization surveillance program. It is not intended to diagnose MRSA infection nor to guide or monitor treatment for MRSA infections.    Studies/Results: Ct Abdomen Pelvis W Contrast  03/17/2015  CLINICAL DATA:  Acute abdominal pain and distention. EXAM: CT ABDOMEN AND PELVIS WITH CONTRAST TECHNIQUE: Multidetector CT imaging of the abdomen and pelvis was performed using the standard protocol following bolus administration of intravenous contrast. CONTRAST:  05/15/2015 OMNIPAQUE IOHEXOL 300 MG/ML  SOLN COMPARISON:  None. FINDINGS: Mild bilateral posterior basilar atelectasis is noted. Severe multilevel degenerative disc disease is noted in the upper lumbar spine. No  gallstones are noted. Small cyst is seen in posterior segment of right hepatic lobe. Otherwise, the liver, spleen and pancreas appear normal per adrenal glands appear normal. Parapelvic cysts are noted bilaterally. No hydronephrosis or renal obstruction is noted. Nasogastric tube tip is seen in distal stomach. Atherosclerosis of abdominal aorta is noted without aneurysm formation. There is no evidence of bowel obstruction. No abnormal fluid collection is noted. Status post hysterectomy. Ovaries are not well visualized. No significant adenopathy is noted. Foley catheter is present within urinary bladder. Irregular wall thickening is seen involving the posterior portion of the urinary bladder. IMPRESSION: Atherosclerosis of abdominal aorta  without aneurysm formation. Mild bilateral posterior basilar subsegmental atelectasis. No hydronephrosis or renal obstruction is noted. No renal or ureteral calculi are noted. Urinary bladder is only mildly distended with Foley catheter present; irregular wall thickening is noted posteriorly which may simply represent it's underdistended state, but focal inflammation or neoplasm cannot be excluded. Cystoscopy is recommended for further evaluation. Electronically Signed   By: Lupita Raider, M.D.   On: 03/17/2015 11:59   Micro Results: Recent Results (from the past 240 hour(s))  Blood Culture (routine x 2)     Status: None   Collection Time: 03/12/15 10:41 AM  Result Value Ref Range Status   Specimen Description BLOOD RIGHT HAND  Final   Special Requests BOTTLES DRAWN AEROBIC ONLY 4CC  Final   Culture NO GROWTH 5 DAYS  Final   Report Status 03/17/2015 FINAL  Final  Blood Culture (routine x 2)     Status: None   Collection Time: 03/12/15 11:45 AM  Result Value Ref Range Status   Specimen Description BLOOD LEFT HAND  Final   Special Requests BOTTLES DRAWN AEROBIC ONLY 4CC  Final   Culture NO GROWTH 5 DAYS  Final   Report Status 03/17/2015 FINAL  Final  Urine culture     Status: None   Collection Time: 03/12/15 12:00 PM  Result Value Ref Range Status   Specimen Description URINE, CATHETERIZED  Final   Special Requests NONE  Final   Culture   Final    NO GROWTH 1 DAY Performed at [facility]    Report Status 03/13/2015 FINAL  Final  MRSA PCR Screening     Status: None   Collection Time: 03/12/15  2:20 PM  Result Value Ref Range Status   MRSA by PCR NEGATIVE NEGATIVE Final    Comment:        The GeneXpert MRSA Assay (FDA approved for NASAL specimens only), is one component of a comprehensive MRSA colonization surveillance program. It is not intended to diagnose MRSA infection nor to guide or monitor treatment for MRSA infections.    Studies/Results: Ct Abdomen Pelvis W  Contrast  03/17/2015  CLINICAL DATA:  Acute abdominal pain and distention. EXAM: CT ABDOMEN AND PELVIS WITH CONTRAST TECHNIQUE: Multidetector CT imaging of the abdomen and pelvis was performed using the standard protocol following bolus administration of intravenous contrast. CONTRAST:  OMNIPAQUE IOHEXOL 300 MG/ML  SOLN COMPARISON:  None. FINDINGS: Mild bilateral posterior basilar atelectasis is noted. Severe multilevel degenerative disc disease is noted in the upper lumbar spine. No gallstones are noted. Small cyst is seen in posterior segment of right hepatic lobe. Otherwise, the liver, spleen and pancreas appear normal per adrenal glands appear normal. Parapelvic cysts are noted bilaterally. No hydronephrosis or renal obstruction is noted. Nasogastric tube tip is seen in distal stomach. Atherosclerosis of abdominal aorta is noted without aneurysm formation.  There is no evidence of bowel obstruction. No abnormal fluid collection is noted. Status post hysterectomy. Ovaries are not well visualized. No significant adenopathy is noted. Foley catheter is present within urinary bladder. Irregular wall thickening is seen involving the posterior portion of the urinary bladder. IMPRESSION: Atherosclerosis of abdominal aorta without aneurysm formation. Mild bilateral posterior basilar subsegmental atelectasis. No hydronephrosis or renal obstruction is noted. No renal or ureteral calculi are noted. Urinary bladder is only mildly distended with Foley catheter present; irregular wall thickening is noted posteriorly which may simply represent it's underdistended state, but focal inflammation or neoplasm cannot be excluded. Cystoscopy is recommended for further evaluation. Electronically Signed   By: Lupita Raider, M.D.   On: 03/17/2015 11:59   Medications:  I have reviewed the patient's current medications Scheduled Meds: . enoxaparin (LOVENOX) injection  40 mg Subcutaneous Q24H  . levothyroxine  37.5 mcg  Intravenous Daily   Continuous Infusions: . dextrose 5 % and 0.45 % NaCl with KCl 20 mEq/L 100 mL/hr at 03/18/15 0636   PRN Meds:.cyclobenzaprine, LORazepam, morphine injection, ondansetron **OR** ondansetron (ZOFRAN) IV   Assessment/Plan: #1. Partial small bowel obstruction. Resolved. Diet advanced per surgery. Move to a regular room today. Remove Foley catheter for a voiding trial. #2. Dehydration. Resolved. #3. Right thigh pain. Continue cyclobenzaprine as needed. Consult physical therapy. #4. Depression. Stable. Principal Problem:   Partial small bowel obstruction (HCC) Active Problems:   Depression   Hypotension   Lactic acidosis   Dehydration   SBO (small bowel obstruction) (HCC)     LOS: 6 days   Alexanderjames Berg 03/18/2015, 7:36 AM

## 2015-03-18 NOTE — Progress Notes (Signed)
Subjective: No nausea or vomiting. Did have a bowel movement last night and has had several episodes of flatus. Denies any abdominal pain.  Objective: Vital signs in last 24 hours: Temp:  [97.4 F (36.3 C)-98.4 F (36.9 C)] 97.4 F (36.3 C) (01/04 0748) Pulse Rate:  [97-121] 113 (01/04 1100) Resp:  [11-29] 17 (01/04 1100) BP: (113-142)/(59-92) 122/59 mmHg (01/04 1100) SpO2:  [88 %-99 %] 92 % (01/04 1100) Weight:  [54.6 kg (120 lb 5.9 oz)] 54.6 kg (120 lb 5.9 oz) (01/04 0600) Last BM Date: 03/17/15  Intake/Output from previous day: 01/03 0701 - 01/04 0700 In: 1780 [P.O.:780; I.V.:1000] Out: 3250 [Urine:3100; Emesis/NG output:150] Intake/Output this shift: Total I/O In: 1850 [P.O.:120; I.V.:1730] Out: 950 [Urine:950]  General appearance: cooperative and no distress GI: soft, non-tender; bowel sounds normal; no masses,  no organomegaly  Lab Results:  No results for input(s): WBC, HGB, HCT, PLT in the last 72 hours. BMET  Recent Labs  03/16/15 0445 03/17/15 0551  NA 144 143  K 3.5 4.0  CL 105 107  CO2 30 28  GLUCOSE 126* 136*  BUN <5* 5*  CREATININE 0.69 0.65  CALCIUM 8.6* 8.6*   PT/INR No results for input(s): LABPROT, INR in the last 72 hours.  Studies/Results: Ct Abdomen Pelvis W Contrast  03/17/2015  CLINICAL DATA:  Acute abdominal pain and distention. EXAM: CT ABDOMEN AND PELVIS WITH CONTRAST TECHNIQUE: Multidetector CT imaging of the abdomen and pelvis was performed using the standard protocol following bolus administration of intravenous contrast. CONTRAST:  OMNIPAQUE IOHEXOL 300 MG/ML  SOLN COMPARISON:  None. FINDINGS: Mild bilateral posterior basilar atelectasis is noted. Severe multilevel degenerative disc disease is noted in the upper lumbar spine. No gallstones are noted. Small cyst is seen in posterior segment of right hepatic lobe. Otherwise, the liver, spleen and pancreas appear normal per adrenal glands appear normal. Parapelvic cysts are noted  bilaterally. No hydronephrosis or renal obstruction is noted. Nasogastric tube tip is seen in distal stomach. Atherosclerosis of abdominal aorta is noted without aneurysm formation. There is no evidence of bowel obstruction. No abnormal fluid collection is noted. Status post hysterectomy. Ovaries are not well visualized. No significant adenopathy is noted. Foley catheter is present within urinary bladder. Irregular wall thickening is seen involving the posterior portion of the urinary bladder. IMPRESSION: Atherosclerosis of abdominal aorta without aneurysm formation. Mild bilateral posterior basilar subsegmental atelectasis. No hydronephrosis or renal obstruction is noted. No renal or ureteral calculi are noted. Urinary bladder is only mildly distended with Foley catheter present; irregular wall thickening is noted posteriorly which may simply represent it's underdistended state, but focal inflammation or neoplasm cannot be excluded. Cystoscopy is recommended for further evaluation. Electronically Signed   By: Lupita Raider, M.D.   On: 03/17/2015 11:59    Anti-infectives: Anti-infectives    Start     Dose/Rate Route Frequency Ordered Stop   03/14/15 0600  vancomycin (VANCOCIN) IVPB 750 mg/150 ml premix  Status:  Discontinued     750 mg 150 mL/hr over 60 Minutes Intravenous Every 48 hours 03/12/15 1427 03/12/15 1633   03/13/15 1100  Levofloxacin (LEVAQUIN) IVPB 250 mg  Status:  Discontinued     250 mg 50 mL/hr over 60 Minutes Intravenous Every 24 hours 03/12/15 1427 03/12/15 1633   03/12/15 2000  aztreonam (AZACTAM) 1 g in dextrose 5 % 50 mL IVPB  Status:  Discontinued     1 g 100 mL/hr over 30 Minutes Intravenous Every 8 hours 03/12/15 1427  03/12/15 1633   03/12/15 1015  levofloxacin (LEVAQUIN) IVPB 750 mg     750 mg 100 mL/hr over 90 Minutes Intravenous  Once 03/12/15 1014 03/12/15 1151   03/12/15 1015  aztreonam (AZACTAM) 2 g in dextrose 5 % 50 mL IVPB     2 g 100 mL/hr over 30 Minutes  Intravenous  Once 03/12/15 1014 03/12/15 1057   03/12/15 1015  vancomycin (VANCOCIN) IVPB 1000 mg/200 mL premix     1,000 mg 200 mL/hr over 60 Minutes Intravenous  Once 03/12/15 1014 03/12/15 1156      Assessment/Plan: Impression: Small bowel obstruction, resolved. CT scan results yesterday are reassuring that this is not a mechanical bowel obstruction. A significant portion of the oral contrast is noted in the colon. No need for surgical intervention. Plan: We'll advance diet as tolerated. Have added Miralax for possible bowel atony.  LOS: 6 days    Tyreece Gelles A 03/18/2015

## 2015-03-18 NOTE — Plan of Care (Signed)
Problem: Acute Rehab PT Goals(only PT should resolve) Goal: Patient Will Transfer Sit To/From Stand Pt will transfer sit to/from-stand with RW at ModI without loss-of-balance and HR less than 100 bpm  to demonstrate good safety awareness for independent mobility in home.     Goal: Pt Will Ambulate Pt will ambulate with LRAD at Supervision using a step-through pattern and equal step length for a distances greater than 253ft with Hr <110bpm to demonstrate the ability to perform safe household distance ambulation at discharge.

## 2015-03-18 NOTE — Evaluation (Addendum)
Physical Therapy Evaluation Patient Details Name: Erica [name] MRN: [id number] DOB: [birth year] Today's Date: 03/18/2015   History of Present Illness  Pt is a 82yo white female with PMH: hypotention, depression, lactic acidosis, hypothyroidism, and RA. Pt began to self limit mobility, remaining in bed mostly, after R lateral quadriceps cramping and pain began about 1 month ago. Pt came to [facility] on 12/29 after 2 days of N/V, found to have leukocytosis, elevated lactic acid, elevated creatinine, and Hb: 20.7.  Pt admitted with a partial small bowel obstruction. which has now been resolved non-surgically. R thigh pain remains.  Clinical Impression  Pt is received semirecumbent in bed upon entry, awake, alert, and willing to participate. Pt reporting some pain in right thigh which is improved since meds were given. Pt is A&Ox3 and pleasant. Pt reports zero falls in the last 6 months.  Pt strength as screened by MMT is demonstrates focal pain and weakness, most notably on the R side, and parallels increased need with mobility in/out of bed. Pt is tachycardic upon arrival with NSR in 110's, which increases to 130's standing. Additional gait and balance testing is limited at this time due to tachycardia. Pt denies any angina with these findings.Patient presenting with impairment of strength, range of motion, balance, oxygen perfusion, and activity tolerance, limiting ability to perform ADL and mobility tasks at baseline level of function. Patient will benefit from skilled intervention to address the above impairments and limitations, in order to restore to prior level of function, improve patient safety upon discharge, and to decrease falls risk. Recommending DC to STR with intermittent supervision due to profound weakness, pain, and mobility impairments.   * recommending further imaging/ortho consult to address RLE pain and R/O metabolic origin.      Follow Up Recommendations SNF    Equipment  Recommendations  None recommended by PT    Recommendations for Other Services       Precautions / Restrictions Precautions Precautions: None (No falls score available at eval. ) Restrictions Weight Bearing Restrictions: No      Mobility  Bed Mobility Overal bed mobility: Needs Assistance Bed Mobility: Supine to Sit;Sit to Supine     Supine to sit: Min assist Sit to supine: Mod assist   General bed mobility comments: assistance with legs bilat  Transfers Overall transfer level: Needs assistance Equipment used: Rolling walker (2 wheeled) Transfers: Sit to/from Stand Sit to Stand: Supervision         General transfer comment: HR increases to 135bpm, NSR. Returned to sitting immediately.   Ambulation/Gait Ambulation/Gait assistance:  (not attempted due to cardiac status. )              Stairs            Wheelchair Mobility    Modified Rankin (Stroke Patients Only)       Balance Overall balance assessment: Needs assistance (not formally assessed due to cardiac status. ) Sitting-balance support: No upper extremity supported;Feet supported Sitting balance-Leahy Scale: Good                                       Pertinent Vitals/Pain Pain Assessment: Faces Faces Pain Scale: Hurts whole lot Pain Location: R vastus lateralis pain, maintaining knee supported in flexion, worse with sitting EOB, active R hip flexion, active R knee extention. 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  ) Available Help at Discharge: Personal care attendant;Family (has care from 9-6 M-F,  and limited support from family. ) Type of Home: House Home Access: Level entry     Home Layout: Two level;Able to live on main level with bedroom/bathroom Home Equipment: Gilmer Mor - single point;Walker - 4 wheels;Bedside commode      Prior Function Level of Independence: Independent with assistive device(s)               Hand Dominance   Dominant Hand: Right    Extremity/Trunk Assessment   Upper Extremity Assessment: Overall WFL for tasks assessed (grip strength good, with ulnar drift bilat; trigger fingers at digit 4 bilaterally. )           Lower Extremity Assessment: RLE deficits/detail;Generalized weakness (requires assistance with legs for OOB and return to bed.. strength testing is limited due to pain. hip flexion is 2+/5 bilat, R knee extention is 2+/5; ankle DF/PF 4/5 bilat. )         Communication   Communication: No difficulties  Cognition Arousal/Alertness: Awake/alert Behavior During Therapy: WFL for tasks assessed/performed Overall Cognitive Status: Within Functional Limits for tasks assessed                      General Comments      Exercises General Exercises - Lower Extremity Short Arc Quad: AROM;Right;15 reps (encouraged to perform 3x daily 10-15 reps)      Assessment/Plan    PT Assessment Patient needs continued PT services  PT Diagnosis Difficulty walking;Generalized weakness   PT Problem List Decreased strength;Decreased range of motion;Decreased activity tolerance;Cardiopulmonary status limiting activity;Decreased balance;Decreased mobility;Pain  PT Treatment Interventions Gait training;Therapeutic exercise;Therapeutic activities;Balance training;Patient/family education   PT Goals (Current goals can be found in the Care Plan section) Acute Rehab PT Goals Patient Stated Goal: return to home continue to care for husband.  PT Goal Formulation: With patient Time For Goal Achievement: 04/01/15 Potential to Achieve Goals: Fair     Frequency Min 3X/week   Barriers to discharge Decreased caregiver support      Co-evaluation               End of Session Equipment Utilized During Treatment: Gait belt Activity Tolerance: Patient tolerated treatment well;Patient limited by pain;Treatment limited secondary to medical complications (Comment) Patient left: in bed;with call bell/phone within reach;with bed alarm set Nurse Communication: Mobility status;Precautions;Other (comment) (Needs ortho consult. )         Time: 2505-3976 PT Time Calculation (min) (ACUTE ONLY): 28 min   Charges:   PT Evaluation $PT Eval High Complexity: 1 Procedure PT Treatments $Therapeutic Activity: 8-22 mins   PT G Codes:        Erica [name] 04-15-15, 3:00 PM  3:04 PM  Erica [name], PT, DPT [place] License # [id number]

## 2015-03-19 ENCOUNTER — Inpatient Hospital Stay (HOSPITAL_COMMUNITY): Payer: Medicare Other

## 2015-03-19 DIAGNOSIS — M5410 Radiculopathy, site unspecified: Secondary | ICD-10-CM

## 2015-03-19 LAB — CREATININE, SERUM
CREATININE: 0.61 mg/dL (ref 0.44–1.00)
GFR calc Af Amer: >60 mL/min (ref >60)
GFR calc non Af Amer: >60 mL/min (ref >60)

## 2015-03-19 MED ORDER — GABAPENTIN 100 MG PO CAPS
100.0000 mg | ORAL_CAPSULE | Freq: Three times a day (TID) | ORAL | Status: DC
  Filled 2015-03-19 (×2): qty 1

## 2015-03-19 MED ORDER — METHYLPREDNISOLONE SODIUM SUCC 40 MG IJ SOLR
40.0000 mg | Freq: Four times a day (QID) | INTRAMUSCULAR | Status: DC
  Administered 2015-03-19 – 2015-03-20 (×3): 40 mg via INTRAVENOUS
  Filled 2015-03-19 (×3): qty 1

## 2015-03-19 NOTE — Progress Notes (Signed)
Report called to [address] to [place], Charity fundraiser. Pt to transfer to 339 via wheelchair. VS stable. Family and belongings at bedside. No questions or complaints at this time. Yasuo Phimmasone L

## 2015-03-19 NOTE — Progress Notes (Signed)
Subjective: Erica [name] denies abdominal pain, nausea or vomiting. She has had a bowel movement. She is able to eat. She is not able to walk because of pain in her right leg. She has been seen by physical therapy.  Objective: Vital signs in last 24 hours: Filed Vitals:   03/18/15 2100 03/19/15 0000 03/19/15 0400 03/19/15 0500  BP: 128/78 130/88 134/76   Pulse: 114 121 104   Temp:  98.3 F (36.8 C) 97.5 F (36.4 C)   TempSrc:  Oral Oral   Resp: 17 13 15    Height:      Weight:    118 lb 2.7 oz (53.6 kg)  SpO2: 96% 93% 93%    Weight change: -2 lb 3.3 oz (-1 kg)  Intake/Output Summary (Last 24 hours) at 03/19/15 0736 Last data filed at 03/19/15 0500  Gross per 24 hour  Intake   2490 ml  Output   2851 ml  Net   -361 ml    Physical Exam: Drowsy. No distress. Lungs clear. Heart regular in the 90s. Abdomen soft and nontender. Right leg reveals no swelling or tenderness on palpation.  Lab Results:    Results for orders placed or performed during the hospital encounter of 03/12/15 (from the past 24 hour(s))  Creatinine, serum     Status: None   Collection Time: 03/19/15  5:41 AM  Result Value Ref Range   Creatinine, Ser 0.61 0.44 - 1.00 mg/dL   GFR calc non Af Amer >60 >60 mL/min   GFR calc Af Amer >60 >60 mL/min     ABGS No results for input(s): PHART, PO2ART, TCO2, HCO3 in the last 72 hours.  Invalid input(s): PCO2 CULTURES Recent Results (from the past 240 hour(s))  Blood Culture (routine x 2)     Status: None   Collection Time: 03/12/15 10:41 AM  Result Value Ref Range Status   Specimen Description BLOOD RIGHT HAND  Final   Special Requests BOTTLES DRAWN AEROBIC ONLY 4CC  Final   Culture NO GROWTH 5 DAYS  Final   Report Status 03/17/2015 FINAL  Final  Blood Culture (routine x 2)     Status: None   Collection Time: 03/12/15 11:45 AM  Result Value Ref Range Status   Specimen Description BLOOD LEFT HAND  Final   Special Requests BOTTLES DRAWN AEROBIC ONLY 4CC  Final    Culture NO GROWTH 5 DAYS  Final   Report Status 03/17/2015 FINAL  Final  Urine culture     Status: None   Collection Time: 03/12/15 12:00 PM  Result Value Ref Range Status   Specimen Description URINE, CATHETERIZED  Final   Special Requests NONE  Final   Culture   Final    NO GROWTH 1 DAY Performed at [facility]    Report Status 03/13/2015 FINAL  Final  MRSA PCR Screening     Status: None   Collection Time: 03/12/15  2:20 PM  Result Value Ref Range Status   MRSA by PCR NEGATIVE NEGATIVE Final    Comment:        The GeneXpert MRSA Assay (FDA approved for NASAL specimens only), is one component of a comprehensive MRSA colonization surveillance program. It is not intended to diagnose MRSA infection nor to guide or monitor treatment for MRSA infections.    Studies/Results: Ct Abdomen Pelvis W Contrast  03/17/2015  CLINICAL DATA:  Acute abdominal pain and distention. EXAM: CT ABDOMEN AND PELVIS WITH CONTRAST TECHNIQUE: Multidetector CT imaging of the abdomen  and pelvis was performed using the standard protocol following bolus administration of intravenous contrast. CONTRAST:  OMNIPAQUE IOHEXOL 300 MG/ML  SOLN COMPARISON:  None. FINDINGS: Mild bilateral posterior basilar atelectasis is noted. Severe multilevel degenerative disc disease is noted in the upper lumbar spine. No gallstones are noted. Small cyst is seen in posterior segment of right hepatic lobe. Otherwise, the liver, spleen and pancreas appear normal per adrenal glands appear normal. Parapelvic cysts are noted bilaterally. No hydronephrosis or renal obstruction is noted. Nasogastric tube tip is seen in distal stomach. Atherosclerosis of abdominal aorta is noted without aneurysm formation. There is no evidence of bowel obstruction. No abnormal fluid collection is noted. Status post hysterectomy. Ovaries are not well visualized. No significant adenopathy is noted. Foley catheter is present within urinary bladder.  Irregular wall thickening is seen involving the posterior portion of the urinary bladder. IMPRESSION: Atherosclerosis of abdominal aorta without aneurysm formation. Mild bilateral posterior basilar subsegmental atelectasis. No hydronephrosis or renal obstruction is noted. No renal or ureteral calculi are noted. Urinary bladder is only mildly distended with Foley catheter present; irregular wall thickening is noted posteriorly which may simply represent it's underdistended state, but focal inflammation or neoplasm cannot be excluded. Cystoscopy is recommended for further evaluation. Electronically Signed   By: Lupita Raider, M.D.   On: 03/17/2015 11:59   Micro Results: Recent Results (from the past 240 hour(s))  Blood Culture (routine x 2)     Status: None   Collection Time: 03/12/15 10:41 AM  Result Value Ref Range Status   Specimen Description BLOOD RIGHT HAND  Final   Special Requests BOTTLES DRAWN AEROBIC ONLY 4CC  Final   Culture NO GROWTH 5 DAYS  Final   Report Status 03/17/2015 FINAL  Final  Blood Culture (routine x 2)     Status: None   Collection Time: 03/12/15 11:45 AM  Result Value Ref Range Status   Specimen Description BLOOD LEFT HAND  Final   Special Requests BOTTLES DRAWN AEROBIC ONLY 4CC  Final   Culture NO GROWTH 5 DAYS  Final   Report Status 03/17/2015 FINAL  Final  Urine culture     Status: None   Collection Time: 03/12/15 12:00 PM  Result Value Ref Range Status   Specimen Description URINE, CATHETERIZED  Final   Special Requests NONE  Final   Culture   Final    NO GROWTH 1 DAY Performed at [facility]    Report Status 03/13/2015 FINAL  Final  MRSA PCR Screening     Status: None   Collection Time: 03/12/15  2:20 PM  Result Value Ref Range Status   MRSA by PCR NEGATIVE NEGATIVE Final    Comment:        The GeneXpert MRSA Assay (FDA approved for NASAL specimens only), is one component of a comprehensive MRSA colonization surveillance program. It is  not intended to diagnose MRSA infection nor to guide or monitor treatment for MRSA infections.    Studies/Results: Ct Abdomen Pelvis W Contrast  03/17/2015  CLINICAL DATA:  Acute abdominal pain and distention. EXAM: CT ABDOMEN AND PELVIS WITH CONTRAST TECHNIQUE: Multidetector CT imaging of the abdomen and pelvis was performed using the standard protocol following bolus administration of intravenous contrast. CONTRAST:  OMNIPAQUE IOHEXOL 300 MG/ML  SOLN COMPARISON:  None. FINDINGS: Mild bilateral posterior basilar atelectasis is noted. Severe multilevel degenerative disc disease is noted in the upper lumbar spine. No gallstones are noted. Small cyst is seen in posterior  segment of right hepatic lobe. Otherwise, the liver, spleen and pancreas appear normal per adrenal glands appear normal. Parapelvic cysts are noted bilaterally. No hydronephrosis or renal obstruction is noted. Nasogastric tube tip is seen in distal stomach. Atherosclerosis of abdominal aorta is noted without aneurysm formation. There is no evidence of bowel obstruction. No abnormal fluid collection is noted. Status post hysterectomy. Ovaries are not well visualized. No significant adenopathy is noted. Foley catheter is present within urinary bladder. Irregular wall thickening is seen involving the posterior portion of the urinary bladder. IMPRESSION: Atherosclerosis of abdominal aorta without aneurysm formation. Mild bilateral posterior basilar subsegmental atelectasis. No hydronephrosis or renal obstruction is noted. No renal or ureteral calculi are noted. Urinary bladder is only mildly distended with Foley catheter present; irregular wall thickening is noted posteriorly which may simply represent it's underdistended state, but focal inflammation or neoplasm cannot be excluded. Cystoscopy is recommended for further evaluation. Electronically Signed   By: Lupita Raider, M.D.   On: 03/17/2015 11:59   Medications:  I have reviewed the  patient's current medications Scheduled Meds: . enoxaparin (LOVENOX) injection  40 mg Subcutaneous Q24H  . levothyroxine  75 mcg Oral QAC breakfast  . polyethylene glycol  17 g Oral BID   Continuous Infusions:  PRN Meds:.cyclobenzaprine, LORazepam, morphine injection, ondansetron **OR** ondansetron (ZOFRAN) IV   Assessment/Plan: #1. Partial small bowel obstruction. Resolved. Advanced to regular diet. #2. Dehydration. Resolved. #3. Right leg pain. X-ray right femur. Consult orthopedics. #4. Urinary retention. Foley has been removed. She is able to void. Principal Problem:   Partial small bowel obstruction (HCC) Active Problems:   Depression   Hypotension   Lactic acidosis   Dehydration   SBO (small bowel obstruction) (HCC)     LOS: 7 days   Erica [name] 03/19/2015, 7:36 AM

## 2015-03-19 NOTE — Clinical Social Work Note (Signed)
Clinical Social Work Assessment  Patient Details  Name: Erica [name] MRN: [id number] Date of Birth: 1932/10/10  Date of referral:  03/19/15               Reason for consult:  Facility Placement                Permission sought to share information with:    Permission granted to share information::     Name::        Agency::     Relationship::     Contact Information:     Housing/Transportation Living arrangements for the past 2 months:  Single Family Home Source of Information:  Patient, Adult Children Patient Interpreter Needed:  None Criminal Activity/Legal Involvement Pertinent to Current Situation/Hospitalization:  No - Comment as needed Significant Relationships:  Adult Children, Spouse Lives with:  Spouse Do you feel safe going back to the place where you live?  Yes Need for family participation in patient care:  Yes (Comment)  Care giving concerns: None identified   Social Worker assessment / plan:  Patient's daughter, Elise Benne, was at bedside.  Patient identified that she resides with her husband, dog and two cats.  She stated that at baseline she ambulates unassisted and completes ADLs independently.  CSW discussed SNF recommendation and provided SNF list. CSW discussed options of outpatient and home health with PT.  She stated that they would discuss options and let CSW know.  Patient did give permission for CSW to send clinicals to [facility].   Employment status:  Retired Health and safety inspector:  Medicaid In [place] PT Recommendations:    Information / Referral to community resources:  Skilled Nursing Facility  Patient/Family's Response to care:  Patient is considering SNF.  Patient/Family's Understanding of and Emotional Response to Diagnosis, Current Treatment, and Prognosis:  Patient is aware of her diagnosis, treatment and prognosis.   Emotional Assessment Appearance:  Appears stated age Attitude/Demeanor/Rapport:   (Cooperative) Affect (typically observed):   Calm Orientation:  Oriented to Self, Oriented to Place, Oriented to  Time, Oriented to Situation Alcohol / Substance use:  Not Applicable Psych involvement (Current and /or in the community):  No (Comment)  Discharge Needs  Concerns to be addressed:  Discharge Planning Concerns Readmission within the last 30 days:  No Current discharge risk:  None Barriers to Discharge:  No Barriers Identified   Annice Needy, LCSW 03/19/2015, 1:12 PM [phone]

## 2015-03-19 NOTE — NC FL2 (Signed)
[place] MEDICAID FL2 LEVEL OF CARE SCREENING TOOL     IDENTIFICATION  Patient Name: Erica [name] Birthdate: 1932/04/26 Sex: female Admission Date (Current Location): 03/12/2015  [facility] and [place] Number:  [employer] and Address:  [facility],  [address] [place] [address]      Provider Number: [id number]  Attending Physician Name and Address:  Carylon Perches, MD  Relative Name and Phone Number:       Current Level of Care: Hospital Recommended Level of Care: Skilled Nursing Facility Prior Approval Number:    Date Approved/Denied:   PASRR Number:    Discharge Plan: SNF    Current Diagnoses: Patient Active Problem List   Diagnosis Date Noted  . Partial small bowel obstruction (HCC) 03/12/2015  . Depression 03/12/2015  . Hypotension 03/12/2015  . Lactic acidosis 03/12/2015  . Dehydration 03/12/2015  . SBO (small bowel obstruction) (HCC) 03/12/2015  . Rapid heart rate 08/24/2013  . Status post subacromial decompression 08/14/2013  . Muscle weakness (generalized) 06/14/2013  . Rotator cuff syndrome of right shoulder 12/05/2012  . Right shoulder pain 12/05/2012  . Rectal bleeding 06/06/2011  . Epigastric pain 04/19/2011  . GERD (gastroesophageal reflux disease) 04/19/2011  . IBS (irritable bowel syndrome) 04/19/2011  . METATARSALGIA 05/12/2009  . BUNION 05/12/2009    Orientation RESPIRATION BLADDER Height & Weight    Self, Time, Situation, Place  O2 (2L) Continent 5\' 3"  (160 cm) 118 lbs.  BEHAVIORAL SYMPTOMS/MOOD NEUROLOGICAL BOWEL NUTRITION STATUS      Continent Diet (regular)  AMBULATORY STATUS COMMUNICATION OF NEEDS Skin   Limited Assist Verbally Normal                       Personal Care Assistance Level of Assistance  Bathing, Dressing Bathing Assistance: Limited assistance   Dressing Assistance: Limited assistance     Functional Limitations Info             SPECIAL CARE FACTORS FREQUENCY  PT (By  licensed PT), OT (By licensed OT)     PT Frequency: 5 OT Frequency: 5            Contractures Contractures Info: Not present    Additional Factors Info  Code Status, Allergies Code Status Info: Full Code Allergies Info: Penicillins           Current Medications (03/19/2015):  This is the current hospital active medication list Current Facility-Administered Medications  Medication Dose Route Frequency Provider Last Rate Last Dose  . cyclobenzaprine (FLEXERIL) tablet 10 mg  10 mg Oral TID PRN 05/17/2015, MD   10 mg at 03/19/15 1054  . enoxaparin (LOVENOX) injection 40 mg  40 mg Subcutaneous Q24H 05/17/15, RPH   40 mg at 03/18/15 1710  . levothyroxine (SYNTHROID, LEVOTHROID) tablet 75 mcg  75 mcg Oral QAC breakfast 05/16/15, MD   75 mcg at 03/19/15 0750  . LORazepam (ATIVAN) injection 0.5 mg  0.5 mg Intravenous Q12H PRN 05/17/15, MD   0.5 mg at 03/18/15 2148  . morphine 2 MG/ML injection 1 mg  1 mg Intravenous Q4H PRN 2149, MD   1 mg at 03/19/15 0606  . ondansetron (ZOFRAN) tablet 4 mg  4 mg Oral Q6H PRN 05/17/15, MD       Or  . ondansetron [facility]) injection 4 mg  4 mg Intravenous Q6H PRN [facility], MD   4 mg  at 03/14/15 0815  . polyethylene glycol (MIRALAX / GLYCOLAX) packet 17 g  17 g Oral BID Franky Macho, MD   17 g at 03/19/15 2336     Discharge Medications: Please see discharge summary for a list of discharge medications.  Relevant Imaging Results:  Relevant Lab Results:   Additional Information SSN [id number]  Liliana Cline, LCSW

## 2015-03-19 NOTE — Consult Note (Signed)
Patient ID: Erica [name], female   DOB: 1932-04-01, 82 y.o.   MRN: [id number]  CONSULT [id number] Detailed history Detailed exam Low complexity  Chief Complaint  Patient presents with  . Weakness    HPI Erica [name] is a 82 y.o. female.  admiited with p SBO   Location C/O PAIN RIGHT THIGH HIP  Duration SEVERAL DAYS Severity 7-10 Quality DULL TOOTH ACHE Modified by BETTER WITH  HIP FLEXION    Review of Systems (at least 2) Review of Systems 1. RECENT SBO UNRELATED TOL FOOD NOW  2. URINARY RETENTION NONE    Past Medical History  Diagnosis Date  . Irritable bowel syndrome   . Thyroid condition   . Depression   . RA (rheumatoid arthritis) (HCC)   . Anxiety      Past Surgical History  Procedure Laterality Date  . Colostomy    . Abdominal hysterectomy    . Colonoscopy    . Upper gastrointestinal endoscopy    . Eye surgery  cataract x 2  . Colonoscopy N/A 05/08/2012    Procedure: COLONOSCOPY;  Surgeon: Malissa Hippo, MD;  [facility];  Service: Endoscopy;  Laterality: N/A;  730  . Bone spurs  08/07/13    Right Shoulder  . Esophagogastroduodenoscopy N/A 11/04/2013    Procedure: ESOPHAGOGASTRODUODENOSCOPY (EGD);  Surgeon: Malissa Hippo, MD;  [facility]: [facility];  Service: Endoscopy;  Laterality: N/A;  730  . Maloney dilation N/A 11/04/2013    Procedure: Elease Hashimoto DILATION;  Surgeon: Malissa Hippo, MD;  [facility]: [facility];  Service: Endoscopy;  Laterality: N/A;     Social History  Social History  Substance Use Topics  . Smoking status: Former Smoker    Types: Cigarettes    Quit date: 04/18/1981  . Smokeless tobacco: Former Neurosurgeon    Quit date: 11/27/1980  . Alcohol Use: No    Allergies  Allergen Reactions  . Penicillins     Current Facility-Administered Medications  Medication Dose Route Frequency Provider Last Rate Last Dose  . cyclobenzaprine (FLEXERIL) tablet 10 mg  10 mg Oral TID PRN Carylon Perches, MD   10 mg at 03/18/15 2148  .  enoxaparin (LOVENOX) injection 40 mg  40 mg Subcutaneous Q24H Maryland Pink, RPH   40 mg at 03/18/15 1710  . levothyroxine (SYNTHROID, LEVOTHROID) tablet 75 mcg  75 mcg Oral QAC breakfast Carylon Perches, MD   75 mcg at 03/19/15 0750  . LORazepam (ATIVAN) injection 0.5 mg  0.5 mg Intravenous Q12H PRN Henderson Cloud, MD   0.5 mg at 03/18/15 2148  . morphine 2 MG/ML injection 1 mg  1 mg Intravenous Q4H PRN Henderson Cloud, MD   1 mg at 03/19/15 0606  . ondansetron (ZOFRAN) tablet 4 mg  4 mg Oral Q6H PRN Henderson Cloud, MD       Or  . ondansetron [facility]) injection 4 mg  4 mg Intravenous Q6H PRN Henderson Cloud, MD   4 mg at 03/14/15 0815  . polyethylene glycol (MIRALAX / GLYCOLAX) packet 17 g  17 g Oral BID Franky Macho, MD   17 g at 03/18/15 2149      Physical Exam-Detailed Physical Exam  Blood pressure 134/76, pulse 104, temperature 97.4 F (36.3 C), temperature source Oral, resp. rate 15, height 5\' 3"  (1.6 m), weight 118 lb 2.7 oz (53.6 kg), SpO2 93 %. Gen. appearance NORMAL ECTOMORPHIC Cardiovascular exam the pulses are 2+ with  no peripheral edema  The ambulatory status is COULD NOT ASSES, BED REST AT PRESENT   Extremity examined LEFT  Inspection HELD IN FLEXION  Range of motion assessment full range of motion HIP JOINT NO PAIN  Stability assessment stability test reveal no instability or laxity Muscle strength and muscle tone are normal with no atrophy or tremors Skin there are no scars rashes lesions or lacerations  Sensation to touch is normal The patient is oriented to person place and time The patient's mood and affect show no depression or anxiety or agitation  For comparison the opposite extremity was examined and the following findings were noted  NORMAL ROM STRENGTH AND ALIGNMENT   MEDICAL DECISION MAKING (minimum/low)  Data Reviewed  I have personally reviewed the imaging studies and the report and my interpretation is:   XRAY HIP/FEMUR I READ AS NORMAL   XRAY L SPINE   DDD SCOLIOSIS SEVERE   Assessment    ACUTE L2 RADICULOPATHY      Plan    IV STEROIDS AND ORAL GABAPENTIN   SHOULD RESOLVE IN 48 HRS , IF NOT MRI IMAGING NEEDED         Fuller Canada 03/19/2015, 8:46 AM

## 2015-03-20 MED ORDER — POLYETHYLENE GLYCOL 3350 17 G PO PACK: 17.0000 g | PACK | Freq: Every day | ORAL | Status: DC

## 2015-03-20 MED ORDER — CYCLOBENZAPRINE HCL 10 MG PO TABS: 10.0000 mg | ORAL_TABLET | Freq: Three times a day (TID) | ORAL | Status: DC | PRN

## 2015-03-20 NOTE — Discharge Summary (Signed)
Physician Discharge Summary  Erica [name] YOV:[id number] DOB: [birth year] DOA: 03/12/2015   Admit date: 03/12/2015 Discharge date: 03/20/2015  Discharge Diagnoses:  Principal Problem:   Partial small bowel obstruction (HCC) Active Problems:   Depression   Hypotension   Lactic acidosis   Dehydration   SBO (small bowel obstruction) (HCC)    Wt Readings from Last 3 Encounters:  03/19/15 118 lb 2.7 oz (53.6 kg)  02/10/15 120 lb 14.4 oz (54.84 kg)  10/07/14 116 lb 3.2 oz (52.708 kg)     Hospital Course:  This patient is an 82 year old female who presented with a 2 day history of recurrent vomiting. She was found to be markedly dehydrated with an elevated BUN and creatinine and low blood pressure. She had an elevated lactic acid level. She underwent vigorous fluid resuscitation. She was monitored closely in the ICU. Abdominal CT revealed a partial small bowel obstruction. She was seen in surgical consultation by Dr. Lovell Sheehan. She responded to anti-medics and nasogastric suctioning. Dehydration resolved. Her BUN and creatinine returned to normal. She underwent a follow-up CT with oral contrast which revealed no obstruction. She was found to have possible bladder wall thickening which will be further assessed with cystoscopy as an outpatient.  Her nasogastric tube was removed and her diet was advanced. She tolerated this well. Mira lax was added. She developed pain in her right thigh. She was seen by orthopedics. She was felt to have an acute L2 radiculopathy. She was treated with methylprednisolone and Flexeril. She is now feeling better. She can walk with assistance.  She developed urinary retention and required Foley catheter placement. Catheter was removed and she has been able to void. Her diet has been advanced and she is tolerating it well.  Her condition is much improved. She is stable for discharge on January 6. Home health will be ordered. She'll follow-up in the office in one  week.  Methotrexate has been held during her hospitalization and will be restarted for her rheumatoid arthritis after discharge.  She has been stable from the standpoint of her depression. She'll follow-up with psychiatry following discharge.   Discharge Instructions     Medication List    STOP taking these medications        loperamide 2 MG tablet  Commonly known as:  IMODIUM A-D      TAKE these medications        ALPRAZolam 0.25 MG tablet  Commonly known as:  XANAX  Take 0.25 mg by mouth daily as needed for anxiety.     cyclobenzaprine 10 MG tablet  Commonly known as:  FLEXERIL  Take 1 tablet (10 mg total) by mouth 3 (three) times daily as needed for muscle spasms.     folic acid 1 MG tablet  Commonly known as:  FOLVITE  Take 1 mg by mouth at bedtime.     Methotrexate (PF) 10 MG/0.4ML Soaj  Inject 0.4 mLs into the skin once a week. saturday     pantoprazole 40 MG tablet  Commonly known as:  PROTONIX  TAKE ONE TABLET BY MOUTH DAILY BEFORE BREAKFAST.     polyethylene glycol packet  Commonly known as:  MIRALAX / GLYCOLAX  Take 17 g by mouth daily.     SYNTHROID 75 MCG tablet  Generic drug:  levothyroxine  Take 75 mcg by mouth daily.         [name] 03/20/2015

## 2015-03-20 NOTE — Progress Notes (Signed)
Pt's IV catheter removed and intact. Pt's IV site clean dry and intact. Discharge instructions, follow up appointments and medications reviewed and discussed with patient. Pt verbalized understanding of medications and follow up appointments. All questions were answered and no further questions at this time. Pt in stable condition and in no acute distress at time of discharge. Pt will be escorted by nurse tech.

## 2015-03-20 NOTE — Clinical Social Work Note (Addendum)
CSW met with patient and discussed discharge planning.  Patient stated that she had chosen to go home with home health PT.   [place] signing off.   Ihor Gully, [place] [phone]

## 2015-03-20 NOTE — Care Management Note (Signed)
Case Management Note  Patient Details  Name: GLORINE HANRATTY MRN: [id number] Date of Birth: 22-Jun-1932  Subjective/Objective:                    Action/Plan:   Expected Discharge Date:  03/14/15               Expected Discharge Plan:  Home w Home Health Services  In-House Referral:  NA  Discharge planning Services  CM Consult  Post Acute Care Choice:  Home Health Choice offered to:  Patient  DME Arranged:    DME Agency:     HH Arranged:  RN, PT HH Agency:  [facility]  Status of Service:  Completed, signed off  Medicare Important Message Given:  Yes Date Medicare IM Given:    Medicare IM give by:    Date Additional Medicare IM Given:    Additional Medicare Important Message give by:     If discussed at Long Length of Stay Meetings, dates discussed:    Additional Comments: Pt discharged home today with [facility] RN and Pt (per pts choice). Alroy Bailiff of [facility] is aware and will collect the pts information from the chart. HH services to start within 48 hours of discharge. Pt stated she did not want to go to SNF and she could manage at home with hh services. Pt stated she had a cane and walker for home use. Pt and pts nurse aware of discharge arrangements. Arlyss Queen [place], RN 03/20/2015, 7:44 AM

## 2015-03-20 NOTE — Care Management Important Message (Signed)
Important Message  Patient Details  Name: Erica [name] MRN: [id number] Date of Birth: [birth year]   Medicare Important Message Given:  Yes    Cheryl Flash, RN 03/20/2015, 7:44 AM

## 2015-04-10 ENCOUNTER — Other Ambulatory Visit (HOSPITAL_COMMUNITY): Payer: Self-pay | Admitting: Respiratory Therapy

## 2015-04-10 DIAGNOSIS — G4730 Sleep apnea, unspecified: Secondary | ICD-10-CM

## 2015-04-10 DIAGNOSIS — R0902 Hypoxemia: Secondary | ICD-10-CM

## 2015-04-17 DIAGNOSIS — Z87891 Personal history of nicotine dependence: Secondary | ICD-10-CM | POA: Diagnosis not present

## 2015-04-17 DIAGNOSIS — E039 Hypothyroidism, unspecified: Secondary | ICD-10-CM | POA: Diagnosis not present

## 2015-04-17 DIAGNOSIS — F329 Major depressive disorder, single episode, unspecified: Secondary | ICD-10-CM | POA: Diagnosis not present

## 2015-04-17 DIAGNOSIS — F339 Major depressive disorder, recurrent, unspecified: Secondary | ICD-10-CM | POA: Diagnosis not present

## 2015-04-20 DIAGNOSIS — F339 Major depressive disorder, recurrent, unspecified: Secondary | ICD-10-CM | POA: Diagnosis not present

## 2015-04-20 DIAGNOSIS — E039 Hypothyroidism, unspecified: Secondary | ICD-10-CM | POA: Diagnosis not present

## 2015-04-20 DIAGNOSIS — F329 Major depressive disorder, single episode, unspecified: Secondary | ICD-10-CM | POA: Diagnosis not present

## 2015-04-20 DIAGNOSIS — Z79899 Other long term (current) drug therapy: Secondary | ICD-10-CM | POA: Diagnosis not present

## 2015-04-23 DIAGNOSIS — F339 Major depressive disorder, recurrent, unspecified: Secondary | ICD-10-CM | POA: Diagnosis not present

## 2015-04-23 DIAGNOSIS — R0602 Shortness of breath: Secondary | ICD-10-CM | POA: Diagnosis not present

## 2015-04-23 DIAGNOSIS — Z6822 Body mass index (BMI) 22.0-22.9, adult: Secondary | ICD-10-CM | POA: Diagnosis not present

## 2015-04-28 ENCOUNTER — Telehealth (INDEPENDENT_AMBULATORY_CARE_PROVIDER_SITE_OTHER): Payer: Self-pay | Admitting: Internal Medicine

## 2015-04-28 NOTE — Telephone Encounter (Signed)
Erica [name] called saying she's been nauceous for over a week. She also mentioned being in the hospital for dehydration 5-6wks ago. She'd like to have an appointment with Dr. Karilyn Cota soon. She didn't want to see Terri. I told her Dr. Patty Sermons schedule is pushed out until May but she wants to see him this month if possible. Please call the patient.   Pt's ph [phone]  Thank you.

## 2015-04-29 NOTE — Telephone Encounter (Signed)
This will be reviewed with the staff, then patient will be called.

## 2015-04-30 NOTE — Telephone Encounter (Signed)
I have made her an appt on 2/28 at 1:15.  Please call patient

## 2015-04-30 NOTE — Telephone Encounter (Signed)
I told the pt's husband about her appointment because she was asleep at the time. Thank you.

## 2015-05-01 NOTE — Telephone Encounter (Signed)
Noted  

## 2015-05-05 ENCOUNTER — Other Ambulatory Visit (INDEPENDENT_AMBULATORY_CARE_PROVIDER_SITE_OTHER): Payer: Self-pay | Admitting: Internal Medicine

## 2015-05-12 ENCOUNTER — Encounter (INDEPENDENT_AMBULATORY_CARE_PROVIDER_SITE_OTHER): Payer: Self-pay | Admitting: Internal Medicine

## 2015-05-12 ENCOUNTER — Ambulatory Visit (INDEPENDENT_AMBULATORY_CARE_PROVIDER_SITE_OTHER): Payer: Medicare Other | Admitting: Internal Medicine

## 2015-05-12 VITALS — BP 130/68 | HR 124 | Temp 98.1°F | Resp 18 | Wt 119.0 lb

## 2015-05-12 DIAGNOSIS — R140 Abdominal distension (gaseous): Secondary | ICD-10-CM

## 2015-05-12 DIAGNOSIS — K219 Gastro-esophageal reflux disease without esophagitis: Secondary | ICD-10-CM | POA: Diagnosis not present

## 2015-05-12 DIAGNOSIS — R110 Nausea: Secondary | ICD-10-CM

## 2015-05-12 MED ORDER — ALIGN PO CAPS: 1.0000 | ORAL_CAPSULE | Freq: Every day | ORAL | Status: DC

## 2015-05-12 MED ORDER — ONDANSETRON HCL 4 MG PO TABS: 4.0000 mg | ORAL_TABLET | Freq: Two times a day (BID) | ORAL | Status: DC

## 2015-05-12 NOTE — Patient Instructions (Signed)
Take ondansetron 4 mg by mouth at bedtime and every morning.

## 2015-05-12 NOTE — Progress Notes (Signed)
Presenting complaint;  Nausea.  Subjective:  Patient is 82 year old Caucasian female who is here for scheduled visit accompanied by her daughter-in-law Angelique Blonder. She was last seen on 02/10/2015 and was doing reasonably well. She was hospitalized at [facility] between 03/12/2015 and 03/20/2015 for what was felt to be partial small bowel obstruction. She responded to medical therapy. She has not felt since then. She complains of daily nausea. Nausea is worse when she wakes up in the morning. She stopped taking ondansetron because it was not working. She has been using promethazine on as-needed basis. She is able to eat despite her nausea. She states she feels nausea and/or throat or neck region but she denies heartburn or dysphagia. She also complains of post prandial bloating. She was having liquid stools with polyethylene glycol and she has reduced the dose. She is having an average of 2 bowel movements per day. Most of her stools are soft and mushy. She denies melena or rectal bleeding. She also denies abdominal pain. Her appetite is not good but she is trying to eat 3 meals a day. She states she has to force feed herself. She was begun on Emsam 3 weeks ago and does not feel any better. She's been told by her psychiatrist and may take 6 weeks before she gets any benefit. Her other concern is her husband's chronic illness. He has dementia CHF and intractable cough. She also complains of insomnia. Her heart rate has been fast. She was on a beta blocker but was stopped because of low blood pressure. She does not feel her nausea is due to methotrexate. She is ambulating with the help of cane.    Current Medications: Outpatient Encounter Prescriptions as of 05/12/2015  Medication Sig  . ALPRAZolam (XANAX) 0.25 MG tablet Take 0.25 mg by mouth daily as needed for anxiety.  . EMSAM 6 MG/24HR Place 6 mg onto the skin daily.   . folic acid (FOLVITE) 1 MG tablet Take 1 mg by mouth at bedtime.   Marland Kitchen levothyroxine  (SYNTHROID) 75 MCG tablet Take 75 mcg by mouth daily.  . Methotrexate, PF, 10 MG/0.4ML SOAJ Inject 0.4 mLs into the skin once a week. saturday  . pantoprazole (PROTONIX) 40 MG tablet TAKE ONE TABLET BY MOUTH DAILY BEFORE BREAKFAST.  Marland Kitchen polyethylene glycol (MIRALAX / GLYCOLAX) packet Take 17 g by mouth daily.  . promethazine (PHENERGAN) 12.5 MG tablet Take 12.5 mg by mouth as needed for nausea or vomiting.  . bifidobacterium infantis (ALIGN) capsule Take 1 capsule by mouth daily.  . ondansetron (ZOFRAN) 4 MG tablet Take 1 tablet (4 mg total) by mouth 2 (two) times daily.  . [DISCONTINUED] cyclobenzaprine (FLEXERIL) 10 MG tablet Take 1 tablet (10 mg total) by mouth 3 (three) times daily as needed for muscle spasms. (Patient not taking: Reported on 05/12/2015)  . [DISCONTINUED] prochlorperazine (COMPAZINE) 10 MG tablet Reported on 05/12/2015   No facility-administered encounter medications on file as of 05/12/2015.     Objective: Blood pressure 130/68, pulse 124, temperature 98.1 F (36.7 C), temperature source Oral, resp. rate 18, weight 119 lb (53.978 kg). Patient is alert and does not appear to be in acute distress. Conjunctiva is pink. Sclera is nonicteric Oropharyngeal mucosa is normal. No neck masses or thyromegaly noted. Cardiac exam with regular rhythm normal S1 and S2. No murmur or gallop noted. Lungs are clear to auscultation. Abdomen is full. Bowel sounds are normal. On palpation abdomen is soft and nontender without organomegaly or masses. No LE edema or clubbing  noted.  Labs/studies Results: CBC from 03/15/2015 WBC 6.0, H&H 11.7 and 34.8 and platelet count 160 4K.  Lab data from 03/17/2015  Serum sodium 143, potassium 4.0, chloride 107, CO2 28, BUN 5, creatinine 0.65 and glucose 136.   Abdominopelvic CT from 03/17/2015 reviewed. No evidence of ascites or dilated loops of small bowel or adenopathy.  Assessment:  #1. Acute on chronic nausea. I do not believe nausea is of GI  origin. Heartburn appears to be well controlled with PPI. EGD 18 months ago revealed small sliding hiatal hernia but no evidence of gastritis or peptic ulcer disease. I believe nausea is due to her depression. If nausea persists further evaluation will be undertaken with EGD and gastric emptying study. #2. Bloating. She does not have symptoms to suggest malabsorption. #3. Insomnia. Will defer therapy to Dr. Ouida Sills. #4. Sinus tachycardia appears to be chronic.  Plan:  Continue pantoprazole at 40 mg by mouth every morning. Ondansetron 4 mg by mouth twice a day. Align 1 capsule by mouth daily Office visit in one month.

## 2015-05-13 ENCOUNTER — Encounter (INDEPENDENT_AMBULATORY_CARE_PROVIDER_SITE_OTHER): Payer: Self-pay | Admitting: Internal Medicine

## 2015-05-13 ENCOUNTER — Ambulatory Visit (INDEPENDENT_AMBULATORY_CARE_PROVIDER_SITE_OTHER): Payer: Self-pay | Admitting: Internal Medicine

## 2015-06-04 DIAGNOSIS — Z79899 Other long term (current) drug therapy: Secondary | ICD-10-CM | POA: Diagnosis not present

## 2015-06-04 DIAGNOSIS — M0589 Other rheumatoid arthritis with rheumatoid factor of multiple sites: Secondary | ICD-10-CM | POA: Diagnosis not present

## 2015-06-04 DIAGNOSIS — M65341 Trigger finger, right ring finger: Secondary | ICD-10-CM | POA: Diagnosis not present

## 2015-06-11 ENCOUNTER — Ambulatory Visit (INDEPENDENT_AMBULATORY_CARE_PROVIDER_SITE_OTHER): Payer: Medicare Other | Admitting: Internal Medicine

## 2015-06-11 ENCOUNTER — Encounter (INDEPENDENT_AMBULATORY_CARE_PROVIDER_SITE_OTHER): Payer: Self-pay | Admitting: Internal Medicine

## 2015-06-11 VITALS — BP 128/70 | HR 98 | Temp 98.4°F | Resp 18 | Ht 62.0 in | Wt 120.3 lb

## 2015-06-11 DIAGNOSIS — R110 Nausea: Secondary | ICD-10-CM | POA: Diagnosis not present

## 2015-06-11 DIAGNOSIS — K589 Irritable bowel syndrome without diarrhea: Secondary | ICD-10-CM

## 2015-06-11 DIAGNOSIS — K219 Gastro-esophageal reflux disease without esophagitis: Secondary | ICD-10-CM

## 2015-06-11 NOTE — Progress Notes (Signed)
Presenting complaint;  Follow-up for nausea GERD and IBS.  Subjective:  Erica [name] is 82 year old Caucasian female who is here for scheduled visit accompanied by her daughter [name] was visiting from [facility] [place]. She was last seen 1 month ago. She feels much better. She's been using wrist band which was suggested to her by her daughter and ever since she has had very little nausea for any. She does not have good appetite but she eats 3 meals a day anyway. She has not lost any weight. She has one to 2 bowel movements per day. Most of her bowel movements are explosive. She denies constipation. She continues complain of bloating. It is worse after meals. She has not experienced any episodes of vomiting. Since her last visit she has single episode of hematochezia when she noted blood on the tissue. She tells me that she may be switched from Klonopin to gabapentin by her psychiatrist. She states her husband is doing much better as for his coughing spells are concerned.   Current Medications: Outpatient Encounter Prescriptions as of 06/11/2015  Medication Sig  . bifidobacterium infantis (ALIGN) capsule Take 1 capsule by mouth daily.  . clonazePAM (KLONOPIN) 0.5 MG tablet Take 0.5 mg by mouth 3 (three) times daily as needed for anxiety. Patient takes 1/2 tablet.  . EMSAM 6 MG/24HR Place 6 mg onto the skin daily.   . folic acid (FOLVITE) 1 MG tablet Take 1 mg by mouth at bedtime.   Marland Kitchen levothyroxine (SYNTHROID) 75 MCG tablet Take 75 mcg by mouth daily.  . Methotrexate, PF, 10 MG/0.4ML SOAJ Inject 0.4 mLs into the skin once a week. saturday  . pantoprazole (PROTONIX) 40 MG tablet TAKE ONE TABLET BY MOUTH DAILY BEFORE BREAKFAST.  Marland Kitchen polyethylene glycol (MIRALAX / GLYCOLAX) packet Take 17 g by mouth daily.  . ondansetron (ZOFRAN) 4 MG tablet Take 1 tablet (4 mg total) by mouth 2 (two) times daily. (Patient not taking: Reported on 06/11/2015)  . promethazine (PHENERGAN) 12.5 MG tablet Take 12.5 mg by mouth as  needed for nausea or vomiting. Reported on 06/11/2015  . [DISCONTINUED] ALPRAZolam (XANAX) 0.25 MG tablet Take 0.25 mg by mouth daily as needed for anxiety. Reported on 06/11/2015   No facility-administered encounter medications on file as of 06/11/2015.     Objective: Blood pressure 128/70, pulse 98, temperature 98.4 F (36.9 C), temperature source Oral, resp. rate 18, height 5\' 2"  (1.575 m), weight 120 lb 4.8 oz (54.568 kg). Patient is alert and in no acute distress. Conjunctiva is pink. Sclera is nonicteric Oropharyngeal mucosa is normal. No neck masses or thyromegaly noted. Cardiac exam with regular rhythm normal S1 and S2. No murmur or gallop noted. Lungs are clear to auscultation. Abdomen is symmetrical. On palpation it is soft and nontender without organomegaly or masses.  No LE edema or clubbing noted.    Assessment:  #1. Chronic nausea felt to be of normal GI origin. She has had very few episodes of nausea in the last 1 month since she has been using wrist band(equivalent of acupuncture). This obviously is a great relief to patient. #2. GERD. Heartburn is well controlled with therapy. #3. Irritable bowel syndrome. She is still having intermittent explosive bowel movements. She is not having constipation or frequent bowel movements. #4. Sporadic hematochezia secondary to hemorrhoids. She is up-to-date read high risk screening.   Plan:  Promethazine removed from the list of her medications. She will continue pantoprazole at current dose and use polyethylene glycol on as-needed basis. Office visit  in 6 months.

## 2015-06-11 NOTE — Patient Instructions (Signed)
Call if symptoms relapse. 

## 2015-06-12 DIAGNOSIS — E039 Hypothyroidism, unspecified: Secondary | ICD-10-CM | POA: Diagnosis not present

## 2015-06-12 DIAGNOSIS — R0602 Shortness of breath: Secondary | ICD-10-CM | POA: Diagnosis not present

## 2015-06-30 ENCOUNTER — Ambulatory Visit (HOSPITAL_COMMUNITY)
Admission: RE | Admit: 2015-06-30 | Discharge: 2015-06-30 | Disposition: A | Discharge status: Home / Self Care | Payer: Medicare Other | Source: Ambulatory Visit | Attending: Internal Medicine | Admitting: Internal Medicine

## 2015-06-30 ENCOUNTER — Other Ambulatory Visit (HOSPITAL_COMMUNITY): Payer: Self-pay | Admitting: Internal Medicine

## 2015-06-30 DIAGNOSIS — R0602 Shortness of breath: Secondary | ICD-10-CM | POA: Insufficient documentation

## 2015-06-30 DIAGNOSIS — Z6823 Body mass index (BMI) 23.0-23.9, adult: Secondary | ICD-10-CM | POA: Diagnosis not present

## 2015-07-01 DIAGNOSIS — E039 Hypothyroidism, unspecified: Secondary | ICD-10-CM | POA: Diagnosis not present

## 2015-07-01 DIAGNOSIS — R0602 Shortness of breath: Secondary | ICD-10-CM | POA: Diagnosis not present

## 2015-07-06 ENCOUNTER — Other Ambulatory Visit (HOSPITAL_COMMUNITY): Payer: Self-pay | Admitting: Respiratory Therapy

## 2015-07-06 DIAGNOSIS — R0602 Shortness of breath: Secondary | ICD-10-CM

## 2015-07-08 ENCOUNTER — Encounter (HOSPITAL_COMMUNITY): Payer: Self-pay

## 2015-07-15 ENCOUNTER — Ambulatory Visit (HOSPITAL_COMMUNITY)
Admission: RE | Admit: 2015-07-15 | Discharge: 2015-07-15 | Disposition: A | Discharge status: Home / Self Care | Payer: Medicare Other | Source: Ambulatory Visit | Attending: Internal Medicine | Admitting: Internal Medicine

## 2015-07-15 DIAGNOSIS — R0602 Shortness of breath: Secondary | ICD-10-CM | POA: Diagnosis not present

## 2015-07-15 LAB — PULMONARY FUNCTION TEST
DL/VA % pred: 78 %
DL/VA: 3.48 ml/min/mmHg/L
DLCO UNC % PRED: 72 %
DLCO unc: 14.62 ml/min/mmHg
FEF 25-75 PRE: 1.52 L/s
FEF2575-%PRED-PRE: 126 %
FEV1-%PRED-PRE: 124 %
FEV1-PRE: 2.05 L
FEV1FVC-%PRED-PRE: 100 %
FEV6-%PRED-PRE: 132 %
FEV6-Pre: 2.76 L
FEV6FVC-%Pred-Pre: 106 %
FVC-%Pred-Pre: 124 %
FVC-Pre: 2.76 L
PRE FEV1/FVC RATIO: 74 %
Pre FEV6/FVC Ratio: 100 %
RV % PRED: 119 %
RV: 2.7 L
TLC % PRED: 100 %
TLC: 4.63 L

## 2015-08-06 DIAGNOSIS — N390 Urinary tract infection, site not specified: Secondary | ICD-10-CM | POA: Diagnosis not present

## 2015-08-11 ENCOUNTER — Ambulatory Visit (INDEPENDENT_AMBULATORY_CARE_PROVIDER_SITE_OTHER): Payer: Self-pay | Admitting: Internal Medicine

## 2015-09-04 DIAGNOSIS — M65341 Trigger finger, right ring finger: Secondary | ICD-10-CM | POA: Diagnosis not present

## 2015-09-04 DIAGNOSIS — Z79899 Other long term (current) drug therapy: Secondary | ICD-10-CM | POA: Diagnosis not present

## 2015-09-04 DIAGNOSIS — M0589 Other rheumatoid arthritis with rheumatoid factor of multiple sites: Secondary | ICD-10-CM | POA: Diagnosis not present

## 2015-09-04 DIAGNOSIS — M7989 Other specified soft tissue disorders: Secondary | ICD-10-CM | POA: Diagnosis not present

## 2015-09-08 DIAGNOSIS — E039 Hypothyroidism, unspecified: Secondary | ICD-10-CM | POA: Diagnosis not present

## 2015-11-04 DIAGNOSIS — E039 Hypothyroidism, unspecified: Secondary | ICD-10-CM | POA: Diagnosis not present

## 2015-11-20 DIAGNOSIS — Z23 Encounter for immunization: Secondary | ICD-10-CM | POA: Diagnosis not present

## 2015-11-24 ENCOUNTER — Ambulatory Visit (INDEPENDENT_AMBULATORY_CARE_PROVIDER_SITE_OTHER): Payer: Medicare Other | Admitting: Internal Medicine

## 2015-11-24 ENCOUNTER — Encounter (INDEPENDENT_AMBULATORY_CARE_PROVIDER_SITE_OTHER): Payer: Self-pay | Admitting: Internal Medicine

## 2015-11-24 VITALS — BP 132/74 | HR 96 | Temp 98.1°F | Resp 18 | Ht 62.0 in | Wt 120.4 lb

## 2015-11-24 DIAGNOSIS — K219 Gastro-esophageal reflux disease without esophagitis: Secondary | ICD-10-CM

## 2015-11-24 DIAGNOSIS — K589 Irritable bowel syndrome without diarrhea: Secondary | ICD-10-CM | POA: Diagnosis not present

## 2015-11-24 DIAGNOSIS — K921 Melena: Secondary | ICD-10-CM

## 2015-11-24 NOTE — Progress Notes (Signed)
Presenting complaint;  Follow-up for IBS and GERD. Patient complains of hematochezia.  Subjective:  Patient is 82 year old Caucasian female was here for scheduled visit. She was last seen in March 2017. She says she is doing better as ferrous bowel movements are concerned. She says today was an unusual day because she had normal bowel movement. She takes Imodium OTC after her bowels have moved. She states she remains under a lot of stress on account for her husband's illness and it is affecting her bowels. However she is not having diarrhea and accidents like she had before. She is having intermittent hematochezia. It always occurs with her bowel movements. She passes small amount of fresh blood. She denies abdominal pain. She says heartburns well controlled except she has intermittent heartburn at night. She wonders if she can take second dose of pantoprazole. She has intermittent nausea but no vomiting. She is on Emsam for depression. She feels she needs higher dose but her psychiatrist decided not to do so. She has not lost any weight since her last visit.   Current Medications: Outpatient Encounter Prescriptions as of 11/24/2015  Medication Sig  . bifidobacterium infantis (ALIGN) capsule Take 1 capsule by mouth daily.  . clonazePAM (KLONOPIN) 0.5 MG tablet Take 0.5 mg by mouth 3 (three) times daily as needed for anxiety. Patient states that she takes 1/2 tablet at night.  . Cyanocobalamin (VITAMIN B-12 PO) Take by mouth daily.  . EMSAM 6 MG/24HR Place 6 mg onto the skin daily.   . folic acid (FOLVITE) 1 MG tablet Take 1 mg by mouth at bedtime.   . gabapentin (NEURONTIN) 100 MG capsule Take 100 mg by mouth 3 (three) times daily.   Marland Kitchen levothyroxine (SYNTHROID, LEVOTHROID) 88 MCG tablet Take 88 mcg by mouth daily before breakfast.   . loperamide (IMODIUM) 2 MG capsule Take 2 mg by mouth 2 (two) times daily as needed for diarrhea or loose stools.  . Methotrexate, PF, 10 MG/0.4ML SOAJ Inject 0.4  mLs into the skin once a week. saturday  . pantoprazole (PROTONIX) 40 MG tablet TAKE ONE TABLET BY MOUTH DAILY BEFORE BREAKFAST.  Marland Kitchen VITAMIN D, CHOLECALCIFEROL, PO Take by mouth daily.  . [DISCONTINUED] levothyroxine (SYNTHROID) 75 MCG tablet Take 88 mcg by mouth daily.   . [DISCONTINUED] ondansetron (ZOFRAN) 4 MG tablet Take 1 tablet (4 mg total) by mouth 2 (two) times daily. (Patient not taking: Reported on 06/11/2015)  . [DISCONTINUED] polyethylene glycol (MIRALAX / GLYCOLAX) packet Take 17 g by mouth daily. (Patient not taking: Reported on 11/24/2015)   No facility-administered encounter medications on file as of 11/24/2015.      Objective: Blood pressure 132/74, pulse 96, temperature 98.1 F (36.7 C), temperature source Oral, resp. rate 18, height 5\' 2"  (1.575 m), weight 120 lb 6.4 oz (54.6 kg). Patient is alert and in no acute distress. Conjunctiva is pink. Sclera is nonicteric Oropharyngeal mucosa is normal. No neck masses or thyromegaly noted. Cardiac exam with regular rhythm normal S1 and S2. No murmur or gallop noted. Lungs are clear to auscultation. Abdomen is full. Bowel sounds are normal. On palpation abdomen is soft and nontender without organomegaly or masses. No LE edema or clubbing noted. She has Dupuytren's contracture involving the right hand. She has marked swelling to medial 3 metacarpophalangeal joints and ulnar deviation involving the right hand.   Assessment:  #1. Irritable bowel syndrome. She appears to be doing well with current regiment including  probiotic and Imodium. #2. GERD. She is having breakthrough  heartburn some evenings. She can use OTC H2 B. #3. Hematochezia. She has history of hemorrhoids which is felt to be source of her hematochezia. She is up-to-date on screening for CRC for which she is high risk. Last colonoscopy was in [place] 2014   Plan:  Pepcid OTC 20 mg by mouth daily at bedtime when necessary. Patient will call if hematochezia becomes  more frequent or she has large volume hematochezia. Office visit in 6 months.

## 2015-11-24 NOTE — Patient Instructions (Addendum)
Can take Pepcid OTC 20 mg in the evening for breakthrough heartburn. Notify if rectal bleeding becomes more frequent or volume increases.

## 2015-11-25 MED ORDER — FAMOTIDINE 20 MG PO TABS: 20.0000 mg | ORAL_TABLET | Freq: Every evening | ORAL | Status: DC | PRN

## 2015-11-26 ENCOUNTER — Ambulatory Visit (INDEPENDENT_AMBULATORY_CARE_PROVIDER_SITE_OTHER): Payer: Self-pay | Admitting: Internal Medicine

## 2015-12-07 ENCOUNTER — Other Ambulatory Visit (INDEPENDENT_AMBULATORY_CARE_PROVIDER_SITE_OTHER): Payer: Self-pay | Admitting: Internal Medicine

## 2015-12-07 DIAGNOSIS — M65341 Trigger finger, right ring finger: Secondary | ICD-10-CM | POA: Diagnosis not present

## 2015-12-07 DIAGNOSIS — M720 Palmar fascial fibromatosis [Dupuytren]: Secondary | ICD-10-CM | POA: Diagnosis not present

## 2015-12-07 DIAGNOSIS — Z79899 Other long term (current) drug therapy: Secondary | ICD-10-CM | POA: Diagnosis not present

## 2015-12-07 DIAGNOSIS — M0589 Other rheumatoid arthritis with rheumatoid factor of multiple sites: Secondary | ICD-10-CM | POA: Diagnosis not present

## 2015-12-15 ENCOUNTER — Ambulatory Visit (INDEPENDENT_AMBULATORY_CARE_PROVIDER_SITE_OTHER): Payer: Self-pay | Admitting: Internal Medicine

## 2015-12-15 DIAGNOSIS — E039 Hypothyroidism, unspecified: Secondary | ICD-10-CM | POA: Diagnosis not present

## 2015-12-15 DIAGNOSIS — N3001 Acute cystitis with hematuria: Secondary | ICD-10-CM | POA: Diagnosis not present

## 2015-12-15 DIAGNOSIS — N390 Urinary tract infection, site not specified: Secondary | ICD-10-CM | POA: Diagnosis not present

## 2016-01-21 DIAGNOSIS — N3000 Acute cystitis without hematuria: Secondary | ICD-10-CM | POA: Diagnosis not present

## 2016-01-27 ENCOUNTER — Encounter: Payer: Self-pay | Admitting: *Deleted

## 2016-02-03 ENCOUNTER — Ambulatory Visit (INDEPENDENT_AMBULATORY_CARE_PROVIDER_SITE_OTHER): Payer: Medicare Other | Admitting: Obstetrics & Gynecology

## 2016-02-03 ENCOUNTER — Encounter: Payer: Self-pay | Admitting: Obstetrics & Gynecology

## 2016-02-03 VITALS — BP 160/100 | HR 102 | Ht 62.2 in | Wt 118.0 lb

## 2016-02-03 DIAGNOSIS — N952 Postmenopausal atrophic vaginitis: Secondary | ICD-10-CM | POA: Diagnosis not present

## 2016-02-03 DIAGNOSIS — N3281 Overactive bladder: Secondary | ICD-10-CM

## 2016-02-03 DIAGNOSIS — R3915 Urgency of urination: Secondary | ICD-10-CM

## 2016-03-09 DIAGNOSIS — Z79899 Other long term (current) drug therapy: Secondary | ICD-10-CM | POA: Diagnosis not present

## 2016-03-09 DIAGNOSIS — M720 Palmar fascial fibromatosis [Dupuytren]: Secondary | ICD-10-CM | POA: Diagnosis not present

## 2016-03-09 DIAGNOSIS — M0589 Other rheumatoid arthritis with rheumatoid factor of multiple sites: Secondary | ICD-10-CM | POA: Diagnosis not present

## 2016-03-09 DIAGNOSIS — Z6820 Body mass index (BMI) 20.0-20.9, adult: Secondary | ICD-10-CM | POA: Diagnosis not present

## 2016-03-09 DIAGNOSIS — M65341 Trigger finger, right ring finger: Secondary | ICD-10-CM | POA: Diagnosis not present

## 2016-03-21 ENCOUNTER — Telehealth: Payer: Self-pay | Admitting: *Deleted

## 2016-03-21 ENCOUNTER — Other Ambulatory Visit: Payer: Self-pay | Admitting: Obstetrics & Gynecology

## 2016-03-21 MED ORDER — SOLIFENACIN SUCCINATE 10 MG PO TABS: 10.0000 mg | ORAL_TABLET | Freq: Every day | ORAL | 11 refills | Status: DC

## 2016-03-21 MED ORDER — ESTRADIOL 0.1 MG/GM VA CREA: TOPICAL_CREAM | VAGINAL | 12 refills | Status: DC

## 2016-03-21 NOTE — Telephone Encounter (Signed)
Spoke with pt. Pt has some vaginal dryness and irriation. She sometimes leaks urine and wore a pad and symptoms started afterwards. Pt is requesting something to help. Please advise. Thanks!! JSY

## 2016-04-07 DIAGNOSIS — F339 Major depressive disorder, recurrent, unspecified: Secondary | ICD-10-CM | POA: Diagnosis not present

## 2016-04-07 DIAGNOSIS — R000 Tachycardia, unspecified: Secondary | ICD-10-CM | POA: Diagnosis not present

## 2016-04-13 ENCOUNTER — Encounter: Payer: Self-pay | Admitting: Internal Medicine

## 2016-04-24 NOTE — Progress Notes (Signed)
Chief Complaint  Patient presents with  . Referral    burning with urination    Blood pressure (!) 160/100, pulse (!) 102, height 5' 2.2" (1.58 m), weight 118 lb (53.5 kg).  83 y.o. No obstetric history on file. No LMP recorded. Patient has had a hysterectomy. The current method of family planning is status post hysterectomy.  Outpatient Encounter Prescriptions as of 02/03/2016  Medication Sig Note  . EMSAM 6 MG/24HR Place 6 mg onto the skin daily.  05/12/2015: Received from: External Pharmacy  . folic acid (FOLVITE) 1 MG tablet Take 1 mg by mouth at bedtime.    . gabapentin (NEURONTIN) 100 MG capsule Take 100 mg by mouth 3 (three) times daily.  11/24/2015: Received from: External Pharmacy  . levothyroxine (SYNTHROID, LEVOTHROID) 88 MCG tablet Take 88 mcg by mouth daily before breakfast.  11/24/2015: Received from: External Pharmacy  . Methotrexate, PF, 10 MG/0.4ML SOAJ Inject 0.4 mLs into the skin once a week. saturday   . pantoprazole (PROTONIX) 40 MG tablet TAKE ONE TABLET BY MOUTH DAILY BEFORE BREAKFAST.   Marland Kitchen Cyanocobalamin (VITAMIN B-12 PO) Take by mouth daily.   . [DISCONTINUED] bifidobacterium infantis (ALIGN) capsule Take 1 capsule by mouth daily.   . [DISCONTINUED] clonazePAM (KLONOPIN) 0.5 MG tablet Take 0.5 mg by mouth 3 (three) times daily as needed for anxiety. Patient states that she takes 1/2 tablet at night.   . [DISCONTINUED] famotidine (PEPCID) 20 MG tablet Take 1 tablet (20 mg total) by mouth at bedtime as needed for heartburn or indigestion.   . [DISCONTINUED] loperamide (IMODIUM) 2 MG capsule Take 2 mg by mouth 2 (two) times daily as needed for diarrhea or loose stools.   . [DISCONTINUED] Pneumococcal 13-Val Conj Vacc (PREVNAR 13 IM) Inject into the muscle.   . [DISCONTINUED] prochlorperazine (COMPAZINE) 10 MG tablet Take 10 mg by mouth every 6 (six) hours as needed for nausea or vomiting.   . [DISCONTINUED] promethazine (PHENERGAN) 12.5 MG tablet Take 12.5 mg by  mouth every 6 (six) hours as needed for nausea or vomiting.   . [DISCONTINUED] VITAMIN D, CHOLECALCIFEROL, PO Take by mouth daily.    No facility-administered encounter medications on file as of 02/03/2016.     Subjective Pt with increasing issue with pressure urgency not necessarily frequency, has had a couple of urine cultures which I reviewed and have been negative, urinalysis is negative Also with some local irritation and burning with w/o urination  Objective General WDWN female NAD Vulva:  Atrophic menopausal changes Vagina:  atrophic Cervix:  absent Uterus:  uterus absent Adnexa: ovaries:benign negative adnexa,     Pertinent ROS  No nausea, vomiting or diarrhea Nor fever chills or other constitutional symptoms   Labs or studies UA negative    Impression Diagnoses this Encounter::   ICD-9-CM ICD-10-CM   1. 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 Vaginal atrophy 627.3 N95.2     Established relevant diagnosis(es):   Plan/Recommendations: No orders of the defined types were placed in this encounter.   Labs or Scans Ordered: No orders of the defined types were placed in this encounter.   Management:: Begin vesicare and topical estrogen for interrelated problem   Follow up Return if symptoms worsen or fail to improve.           All questions were answered.  Past Medical History:  Diagnosis Date  . Anxiety   . Depression    bipolar  . Hyperlipidemia   .  Irritable bowel syndrome   . RA (rheumatoid arthritis) (HCC)   . Thyroid condition     Past Surgical History:  Procedure Laterality Date  . ABDOMINAL HYSTERECTOMY    . Bone Spurs  08/07/13   Right Shoulder  . COLONOSCOPY    . COLONOSCOPY N/A 05/08/2012   Procedure: COLONOSCOPY;  Surgeon: Malissa Hippo, MD;  Location: [facility];  Service: Endoscopy;  Laterality: N/A;  730  . COLOSTOMY    . ESOPHAGOGASTRODUODENOSCOPY N/A 11/04/2013   Procedure:  ESOPHAGOGASTRODUODENOSCOPY (EGD);  Surgeon: Malissa Hippo, MD;  Location: [facility];  Service: Endoscopy;  Laterality: N/A;  730  . EYE SURGERY  cataract x 2  . MALONEY DILATION N/A 11/04/2013   Procedure: Elease Hashimoto DILATION;  Surgeon: Malissa Hippo, MD;  Location: [facility];  Service: Endoscopy;  Laterality: N/A;  . UPPER GASTROINTESTINAL ENDOSCOPY      OB History    No data available      Allergies  Allergen Reactions  . Penicillins     Social History   Social History  . Marital status: Married    Spouse name: N/A  . Number of children: N/A  . Years of education: N/A   Social History Main Topics  . Smoking status: Former Smoker    Types: Cigarettes    Quit date: 04/18/1981  . Smokeless tobacco: Former Neurosurgeon    Quit date: 11/27/1980  . Alcohol use No  . Drug use: No  . Sexual activity: No   Other Topics Concern  . None   Social History Narrative  . None    Family History  Problem Relation Age of Onset  . Colon cancer Mother   . Bipolar disorder Mother   . Anxiety disorder Mother   . Atrial fibrillation Son   . Depression Maternal Grandmother

## 2016-05-05 DIAGNOSIS — J40 Bronchitis, not specified as acute or chronic: Secondary | ICD-10-CM | POA: Diagnosis not present

## 2016-05-24 ENCOUNTER — Encounter (INDEPENDENT_AMBULATORY_CARE_PROVIDER_SITE_OTHER): Payer: Self-pay | Admitting: Internal Medicine

## 2016-05-24 ENCOUNTER — Ambulatory Visit (INDEPENDENT_AMBULATORY_CARE_PROVIDER_SITE_OTHER): Payer: Medicare Other | Admitting: Internal Medicine

## 2016-05-24 VITALS — BP 128/76 | HR 107 | Temp 97.1°F | Resp 18 | Ht 62.0 in | Wt 118.5 lb

## 2016-05-24 DIAGNOSIS — K580 Irritable bowel syndrome with diarrhea: Secondary | ICD-10-CM

## 2016-05-24 DIAGNOSIS — K219 Gastro-esophageal reflux disease without esophagitis: Secondary | ICD-10-CM | POA: Diagnosis not present

## 2016-05-24 DIAGNOSIS — R634 Abnormal weight loss: Secondary | ICD-10-CM

## 2016-05-24 NOTE — Patient Instructions (Signed)
Weight check in 2 months 

## 2016-05-24 NOTE — Progress Notes (Signed)
Presenting complaint;  Follow-up for GERD and IBS.  Subjective:  Arijana is 83 year old Caucasian female who is here for scheduled visit. She was last seen about 6 months ago. She does not feel well. She feels her depression is not well controlled. She received 6 weeks ofTMS(transcranial magnetic [facility]) [facility] it did not help. She is now under care of Dr.Kaur. She was begun on lithium. She says lithium is causing her headache and insomnia. He has follow-up appointment with Dr. Evelene Croon week after next. She remains concerned about her husband's illness. He has dementia. She denies dysphagia or heartburn. She says she has found every hour for IBS. She is scrambled eggs with 2 table spoons full of all brands every day and she has formed stools usually once or twice daily and occasionally 3 times. She has urgency only when she is anxious or stressed. She has not had any accidents recently. She also denies abdominal pain melena or rectal bleeding. She says she is gradually coming of gabapentin which is caused her to be dizzy. She does not have good appetite. She force feeds herself. He has lost 2 pounds since her last visit. She also has noted deformity to her right hand and now only able to hold pen light objects. She has rheumatoid arthritis.   Current Medications: Outpatient Encounter Prescriptions as of 05/24/2016  Medication Sig  . Cyanocobalamin (VITAMIN B-12 PO) Take by mouth daily.  . EMSAM 6 MG/24HR Place 6 mg onto the skin daily.   . folic acid (FOLVITE) 1 MG tablet Take 1 mg by mouth at bedtime.   . gabapentin (NEURONTIN) 100 MG capsule Take 100 mg by mouth 3 (three) times daily.   Marland Kitchen levothyroxine (SYNTHROID, LEVOTHROID) 88 MCG tablet Take 88 mcg by mouth daily before breakfast.   . lithium carbonate 150 MG capsule Take 150 mg by mouth. Patient takes 1 per day prior to meal.  . Methotrexate, PF, 10 MG/0.4ML SOAJ Inject 0.4 mLs into the skin once a week. saturday  . pantoprazole  (PROTONIX) 40 MG tablet TAKE ONE TABLET BY MOUTH DAILY BEFORE BREAKFAST.  . [DISCONTINUED] estradiol (ESTRACE VAGINAL) 0.1 MG/GM vaginal cream Use a small amount on the vulva at bedtime every other night (Patient not taking: Reported on 05/24/2016)  . [DISCONTINUED] solifenacin (VESICARE) 10 MG tablet Take 1 tablet (10 mg total) by mouth daily. (Patient not taking: Reported on 05/24/2016)   No facility-administered encounter medications on file as of 05/24/2016.      Objective: Blood pressure 128/76, pulse (!) 107, temperature 97.1 F (36.2 C), temperature source Oral, resp. rate 18, height 5\' 2"  (1.575 m), weight 118 lb 8 oz (53.8 kg). Patient is alert and in no acute distress. Conjunctiva is pink. Sclera is nonicteric Oropharyngeal mucosa is normal. No neck masses or thyromegaly noted. Cardiac exam with regular rhythm normal S1 and S2. No murmur or gallop noted. Lungs are clear to auscultation. Abdomen is symmetrical soft and nontender without organomegaly or masses. No LE edema or clubbing noted. She has swelling to MCPs of both hands worse on the right than on the left. She also has ulnar deviation. She also has Dupuytren's contracture involving right hand with limited movement of fingers.   Assessment:  #1. IBS. She is doing well with simple measures. #2. GERD. She is doing well with therapy. I do not believe she is having any side effects with pantoprazole. She has had problems with depression for several years and it has nothing to do with pantoprazole. #  3.  Weight loss appears to be secondary to poor appetite appears to be secondary to depression or side effects of one of her medications. Will continue to monitor   Plan:   Weight check in 2 months. Continue fiber supplement and pantoprazole as before. Office visit in 6 months.

## 2016-06-01 DIAGNOSIS — M79641 Pain in right hand: Secondary | ICD-10-CM | POA: Diagnosis not present

## 2016-06-01 DIAGNOSIS — M05741 Rheumatoid arthritis with rheumatoid factor of right hand without organ or systems involvement: Secondary | ICD-10-CM | POA: Diagnosis not present

## 2016-06-01 DIAGNOSIS — M79642 Pain in left hand: Secondary | ICD-10-CM | POA: Diagnosis not present

## 2016-06-01 DIAGNOSIS — M05742 Rheumatoid arthritis with rheumatoid factor of left hand without organ or systems involvement: Secondary | ICD-10-CM | POA: Diagnosis not present

## 2016-06-07 DIAGNOSIS — M65341 Trigger finger, right ring finger: Secondary | ICD-10-CM | POA: Diagnosis not present

## 2016-06-07 DIAGNOSIS — M720 Palmar fascial fibromatosis [Dupuytren]: Secondary | ICD-10-CM | POA: Diagnosis not present

## 2016-06-07 DIAGNOSIS — Z79899 Other long term (current) drug therapy: Secondary | ICD-10-CM | POA: Diagnosis not present

## 2016-06-07 DIAGNOSIS — M0589 Other rheumatoid arthritis with rheumatoid factor of multiple sites: Secondary | ICD-10-CM | POA: Diagnosis not present

## 2016-06-17 DIAGNOSIS — E785 Hyperlipidemia, unspecified: Secondary | ICD-10-CM | POA: Diagnosis not present

## 2016-06-17 DIAGNOSIS — E039 Hypothyroidism, unspecified: Secondary | ICD-10-CM | POA: Diagnosis not present

## 2016-06-17 DIAGNOSIS — Z79899 Other long term (current) drug therapy: Secondary | ICD-10-CM | POA: Diagnosis not present

## 2016-06-22 ENCOUNTER — Encounter (HOSPITAL_COMMUNITY): Payer: Self-pay | Admitting: Occupational Therapy

## 2016-06-22 ENCOUNTER — Ambulatory Visit (HOSPITAL_COMMUNITY): Payer: Medicare Other | Attending: Orthopedic Surgery | Admitting: Occupational Therapy

## 2016-06-22 DIAGNOSIS — E539 Vitamin B deficiency, unspecified: Secondary | ICD-10-CM | POA: Diagnosis not present

## 2016-06-22 DIAGNOSIS — M069 Rheumatoid arthritis, unspecified: Secondary | ICD-10-CM | POA: Diagnosis not present

## 2016-06-22 DIAGNOSIS — R29898 Other symptoms and signs involving the musculoskeletal system: Secondary | ICD-10-CM | POA: Insufficient documentation

## 2016-06-22 DIAGNOSIS — R278 Other lack of coordination: Secondary | ICD-10-CM | POA: Diagnosis present

## 2016-06-22 DIAGNOSIS — E039 Hypothyroidism, unspecified: Secondary | ICD-10-CM | POA: Diagnosis not present

## 2016-06-22 DIAGNOSIS — E785 Hyperlipidemia, unspecified: Secondary | ICD-10-CM | POA: Diagnosis not present

## 2016-06-22 NOTE — Patient Instructions (Signed)
Home Exercises Program °Theraputty Exercises °   °Do the following exercises 1-2 times a day using your affected hand.  °1. Roll putty into a ball. ° °2. Make into a pancake. ° °3. Roll putty into a roll. ° °4. Pinch along log with first finger and thumb.  ° °5. Make into a ball. ° °6. Roll it back into a log.  ° °7. Pinch using thumb and side of first finger. ° °8. Roll into a ball, then flatten into a pancake. ° °9. Using your fingers, make putty into a mountain. ° °

## 2016-06-22 NOTE — Therapy (Signed)
[facility] [address] [place], [place], [address] Phone: [phone]   Fax:  [phone]  Occupational Therapy Evaluation  Patient Details  Name: Erica [name] MRN: [id number] Date of Birth: Dec 31, 1932 Referring Provider: Dr. Betha Loa  Encounter Date: 06/22/2016      OT End of Session - 06/22/16 1550    Visit Number 1   Number of Visits 1   Date for OT Re-Evaluation 06/24/16   Authorization Type [employer] Medicare; $40 copay per visit   Authorization Time Period Before 10th visit    Authorization - Visit Number 1   Authorization - Number of Visits 10   OT Start Time 1346   OT Stop Time 1431   OT Time Calculation (min) 45 min   Activity Tolerance Patient tolerated treatment well   Behavior During Therapy [facility] for tasks assessed/performed      Past Medical History:  Diagnosis Date  . Anxiety   . Depression    bipolar  . Hyperlipidemia   . Irritable bowel syndrome   . RA (rheumatoid arthritis) (HCC)   . Thyroid condition     Past Surgical History:  Procedure Laterality Date  . ABDOMINAL HYSTERECTOMY    . Bone Spurs  08/07/13   Right Shoulder  . COLONOSCOPY    . COLONOSCOPY N/A 05/08/2012   Procedure: COLONOSCOPY;  Surgeon: Malissa Hippo, MD;  Location: [facility];  Service: Endoscopy;  Laterality: N/A;  730  . COLOSTOMY    . ESOPHAGOGASTRODUODENOSCOPY N/A 11/04/2013   Procedure: ESOPHAGOGASTRODUODENOSCOPY (EGD);  Surgeon: Malissa Hippo, MD;  Location: [facility];  Service: Endoscopy;  Laterality: N/A;  730  . EYE SURGERY  cataract x 2  . MALONEY DILATION N/A 11/04/2013   Procedure: Elease Hashimoto DILATION;  Surgeon: Malissa Hippo, MD;  Location: [facility];  Service: Endoscopy;  Laterality: N/A;  . UPPER GASTROINTESTINAL ENDOSCOPY      There were no vitals filed for this visit.      Subjective Assessment - 06/22/16 1545    Subjective  S: I don't think there is anything you can do to improve my current  functioning.    Pertinent History Pt is an 83 y/o female presenting with rheumatoid arthritis, right hand significantly more severe than left hand. Pt was referred to occupational therapy for evaluation and treatment by Dr. Betha Loa.    Patient Stated Goals To learn strategies to maintain my current functioning.    Currently in Pain? No/denies           [facility] OT Assessment - 06/22/16 1344      Assessment   Diagnosis bilateral rheumatoid arthrtis    Referring Provider Dr. Betha Loa   Onset Date --  chronic   Prior Therapy None     Precautions   Precautions None     Balance Screen   Has the patient fallen in the past 6 months No   Has the patient had a decrease in activity level because of a fear of falling?  No   Is the patient reluctant to leave their home because of a fear of falling?  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     ADL   ADL comments Pt is having difficulty with writing for extended periods of time, using curling iron, cutting with a knife, and using hot glue  gun     Written Expression   Dominant Hand Right   Handwriting 75% legible     Cognition   Overall Cognitive Status Within Functional Limits for tasks assessed     Coordination   Fine Motor Movements are Fluid and Coordinated No     ROM / Strength   AROM / PROM / Strength Strength     Strength   Strength Assessment Site Hand   Right/Left hand Right;Left   Right Hand Grip (lbs) 18   Right Hand Lateral Pinch 6 lbs   Right Hand 3 Point Pinch 4 lbs   Left Hand Grip (lbs) 20   Left Hand Lateral Pinch 6 lbs   Left Hand 3 Point Pinch 4 lbs     Right Hand AROM   R Thumb MCP 0-60 48 Degrees   R Thumb IP 0-80 60 Degrees   R Index  MCP 0-90 72 Degrees   R Index PIP 0-100 70 Degrees   R Index DIP 0-70 62 Degrees   R Long  MCP 0-90 80 Degrees   R Long PIP 0-100 50 Degrees   R Long DIP 0-70 50  Degrees   R Ring  MCP 0-90 80 Degrees   R Ring PIP 0-100 50 Degrees   R Ring DIP 0-70 20 Degrees   R Little  MCP 0-90 80 Degrees   R Little PIP 0-100 55 Degrees   R Little DIP 0-70 52 Degrees     Right Hand PROM   R Thumb MCP 0-60 55 Degrees   R Thumb IP 0-80 72 Degrees   R Index  MCP 0-90 70 Degrees   R Index PIP 0-100 70 Degrees   R Index DIP 0-70 50 Degrees   R Long  MCP 0-90 80 Degrees   R Long PIP 0-100 85 Degrees   R Long DIP 0-70 60 Degrees   R Ring  MCP 0-90 80 Degrees   R Ring PIP 0-100 90 Degrees   R Ring DIP 0-70 60 Degrees   R Little  MCP 0-90 80 Degrees   R Little PIP 0-100 70 Degrees   R Little DIP 0-70 72 Degrees                         OT Education - 06/22/16 1549    Education provided Yes   Education Details theraputty, A/ROM for left hand and thumb, tendon glides. pt educated on planning and importance of rest breaks to reduce fatigue in BUE.    Person(s) Educated Patient   Methods Explanation;Demonstration;Handout   Comprehension Verbalized understanding;Returned demonstration          OT Short Term Goals - 06/22/16 1556      OT SHORT TERM GOAL #1   Title Pt will be educated on HEP and adaptive equipment for improving functional task completion with BUE.    Time 1   Period Days   Status Achieved                  Plan - 06/22/16 1551    Clinical Impression Statement A: Pt is an 83 y/o female presenting with rheumatoid arthritis in BUE, right more severe than left. Discussed therapy options with pt, pt prefers HEP versus therapy visits due to high copay. Extensively educated pt on adapting tools/utensils and available AE for arthritis. Provided with with foam to build up pens/utensils, pt able to write with greater ease using foam. Provided pt  with ROM exercises and strengthening to maintain current motion, especially in LUE.    Rehab Potential Good   OT Frequency One time visit   OT Treatment/Interventions Self-care/ADL  training;Patient/family education;Therapeutic exercise   Plan P: pt provided with information on AE and adapting current tools/utensils, provided with hand/finger A/ROM HEP and red theraputty strengthening HEP.    OT Home Exercise Plan Jul 18, 2022: hand/finger A/ROM, red theraputty   Consulted and Agree with Plan of Care Patient      Patient will benefit from skilled therapeutic intervention in order to improve the following deficits and impairments:  Decreased activity tolerance, Decreased knowledge of use of DME, Decreased strength, Impaired flexibility, Decreased range of motion, Pain, Impaired UE functional use, Decreased coordination  Visit Diagnosis: Other lack of coordination  Other symptoms and signs involving the musculoskeletal system      G-Codes - 2016/07/17 1556    Functional Assessment Tool Used (Outpatient only) clinical judgement   Functional Limitation Carrying, moving and handling objects   Carrying, Moving and Handling Objects Goal Status (N3614) At least 40 percent but less than 60 percent impaired, limited or restricted   Carrying, Moving and Handling Objects Discharge Status [phone]) At least 40 percent but less than 60 percent impaired, limited or restricted      Problem List Patient Active Problem List   Diagnosis Date Noted  . Partial small bowel obstruction 03/12/2015  . Depression 03/12/2015  . Hypotension 03/12/2015  . Lactic acidosis 03/12/2015  . Dehydration 03/12/2015  . SBO (small bowel obstruction) 03/12/2015  . Hypothyroidism 02/21/2014  . 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 Major depressive disorder, recurrent episode with anxious distress (HCC) 02/13/2014  . Rapid heart rate 08/24/2013  . S/P arthroscopy of shoulder 08/14/2013  . Muscle weakness (generalized) 06/14/2013  . Rotator cuff syndrome of right shoulder 12/05/2012  . Right shoulder pain 12/05/2012  . Rectal bleeding 06/06/2011  . Epigastric pain 04/19/2011  . GERD (gastroesophageal reflux  disease) 04/19/2011  . IBS (irritable bowel syndrome) 04/19/2011  . METATARSALGIA 05/12/2009  . BUNION 05/12/2009   Ezra Sites, OTR/L  [phone] 2016-07-17, 3:57 PM  [facility] [address] [place], [place], [address] Phone: [phone]   Fax:  [phone]  Name: Erica [name] MRN: [id number] Date of Birth: 02-Jan-1933

## 2016-06-30 ENCOUNTER — Other Ambulatory Visit (INDEPENDENT_AMBULATORY_CARE_PROVIDER_SITE_OTHER): Payer: Self-pay | Admitting: Internal Medicine

## 2016-07-13 DIAGNOSIS — L821 Other seborrheic keratosis: Secondary | ICD-10-CM | POA: Diagnosis not present

## 2016-07-13 DIAGNOSIS — L570 Actinic keratosis: Secondary | ICD-10-CM | POA: Diagnosis not present

## 2016-07-29 DIAGNOSIS — M9902 Segmental and somatic dysfunction of thoracic region: Secondary | ICD-10-CM | POA: Diagnosis not present

## 2016-07-29 DIAGNOSIS — M5442 Lumbago with sciatica, left side: Secondary | ICD-10-CM | POA: Diagnosis not present

## 2016-07-29 DIAGNOSIS — M9903 Segmental and somatic dysfunction of lumbar region: Secondary | ICD-10-CM | POA: Diagnosis not present

## 2016-07-29 DIAGNOSIS — M9905 Segmental and somatic dysfunction of pelvic region: Secondary | ICD-10-CM | POA: Diagnosis not present

## 2016-08-01 DIAGNOSIS — M9903 Segmental and somatic dysfunction of lumbar region: Secondary | ICD-10-CM | POA: Diagnosis not present

## 2016-08-01 DIAGNOSIS — M5442 Lumbago with sciatica, left side: Secondary | ICD-10-CM | POA: Diagnosis not present

## 2016-08-01 DIAGNOSIS — M9905 Segmental and somatic dysfunction of pelvic region: Secondary | ICD-10-CM | POA: Diagnosis not present

## 2016-08-01 DIAGNOSIS — M9902 Segmental and somatic dysfunction of thoracic region: Secondary | ICD-10-CM | POA: Diagnosis not present

## 2016-08-04 DIAGNOSIS — M5442 Lumbago with sciatica, left side: Secondary | ICD-10-CM | POA: Diagnosis not present

## 2016-08-04 DIAGNOSIS — M9905 Segmental and somatic dysfunction of pelvic region: Secondary | ICD-10-CM | POA: Diagnosis not present

## 2016-08-04 DIAGNOSIS — M9902 Segmental and somatic dysfunction of thoracic region: Secondary | ICD-10-CM | POA: Diagnosis not present

## 2016-08-04 DIAGNOSIS — M9903 Segmental and somatic dysfunction of lumbar region: Secondary | ICD-10-CM | POA: Diagnosis not present

## 2016-08-05 DIAGNOSIS — M9903 Segmental and somatic dysfunction of lumbar region: Secondary | ICD-10-CM | POA: Diagnosis not present

## 2016-08-05 DIAGNOSIS — M9905 Segmental and somatic dysfunction of pelvic region: Secondary | ICD-10-CM | POA: Diagnosis not present

## 2016-08-05 DIAGNOSIS — M5442 Lumbago with sciatica, left side: Secondary | ICD-10-CM | POA: Diagnosis not present

## 2016-08-05 DIAGNOSIS — M9902 Segmental and somatic dysfunction of thoracic region: Secondary | ICD-10-CM | POA: Diagnosis not present

## 2016-08-09 DIAGNOSIS — M9903 Segmental and somatic dysfunction of lumbar region: Secondary | ICD-10-CM | POA: Diagnosis not present

## 2016-08-09 DIAGNOSIS — M9905 Segmental and somatic dysfunction of pelvic region: Secondary | ICD-10-CM | POA: Diagnosis not present

## 2016-08-09 DIAGNOSIS — M5442 Lumbago with sciatica, left side: Secondary | ICD-10-CM | POA: Diagnosis not present

## 2016-08-09 DIAGNOSIS — M9902 Segmental and somatic dysfunction of thoracic region: Secondary | ICD-10-CM | POA: Diagnosis not present

## 2016-08-11 DIAGNOSIS — M9902 Segmental and somatic dysfunction of thoracic region: Secondary | ICD-10-CM | POA: Diagnosis not present

## 2016-08-11 DIAGNOSIS — M5442 Lumbago with sciatica, left side: Secondary | ICD-10-CM | POA: Diagnosis not present

## 2016-08-11 DIAGNOSIS — M9905 Segmental and somatic dysfunction of pelvic region: Secondary | ICD-10-CM | POA: Diagnosis not present

## 2016-08-11 DIAGNOSIS — M9903 Segmental and somatic dysfunction of lumbar region: Secondary | ICD-10-CM | POA: Diagnosis not present

## 2016-08-15 DIAGNOSIS — M9903 Segmental and somatic dysfunction of lumbar region: Secondary | ICD-10-CM | POA: Diagnosis not present

## 2016-08-15 DIAGNOSIS — M9902 Segmental and somatic dysfunction of thoracic region: Secondary | ICD-10-CM | POA: Diagnosis not present

## 2016-08-15 DIAGNOSIS — M5442 Lumbago with sciatica, left side: Secondary | ICD-10-CM | POA: Diagnosis not present

## 2016-08-15 DIAGNOSIS — M9905 Segmental and somatic dysfunction of pelvic region: Secondary | ICD-10-CM | POA: Diagnosis not present

## 2016-08-19 DIAGNOSIS — M5442 Lumbago with sciatica, left side: Secondary | ICD-10-CM | POA: Diagnosis not present

## 2016-08-19 DIAGNOSIS — M9902 Segmental and somatic dysfunction of thoracic region: Secondary | ICD-10-CM | POA: Diagnosis not present

## 2016-08-19 DIAGNOSIS — M9903 Segmental and somatic dysfunction of lumbar region: Secondary | ICD-10-CM | POA: Diagnosis not present

## 2016-08-19 DIAGNOSIS — M9905 Segmental and somatic dysfunction of pelvic region: Secondary | ICD-10-CM | POA: Diagnosis not present

## 2016-08-22 DIAGNOSIS — M9903 Segmental and somatic dysfunction of lumbar region: Secondary | ICD-10-CM | POA: Diagnosis not present

## 2016-08-22 DIAGNOSIS — M9905 Segmental and somatic dysfunction of pelvic region: Secondary | ICD-10-CM | POA: Diagnosis not present

## 2016-08-22 DIAGNOSIS — M9902 Segmental and somatic dysfunction of thoracic region: Secondary | ICD-10-CM | POA: Diagnosis not present

## 2016-08-22 DIAGNOSIS — M5442 Lumbago with sciatica, left side: Secondary | ICD-10-CM | POA: Diagnosis not present

## 2016-08-26 DIAGNOSIS — M9902 Segmental and somatic dysfunction of thoracic region: Secondary | ICD-10-CM | POA: Diagnosis not present

## 2016-08-26 DIAGNOSIS — M9905 Segmental and somatic dysfunction of pelvic region: Secondary | ICD-10-CM | POA: Diagnosis not present

## 2016-08-26 DIAGNOSIS — M9903 Segmental and somatic dysfunction of lumbar region: Secondary | ICD-10-CM | POA: Diagnosis not present

## 2016-08-26 DIAGNOSIS — M5442 Lumbago with sciatica, left side: Secondary | ICD-10-CM | POA: Diagnosis not present

## 2016-08-31 DIAGNOSIS — M9903 Segmental and somatic dysfunction of lumbar region: Secondary | ICD-10-CM | POA: Diagnosis not present

## 2016-08-31 DIAGNOSIS — M9905 Segmental and somatic dysfunction of pelvic region: Secondary | ICD-10-CM | POA: Diagnosis not present

## 2016-08-31 DIAGNOSIS — M5442 Lumbago with sciatica, left side: Secondary | ICD-10-CM | POA: Diagnosis not present

## 2016-08-31 DIAGNOSIS — M9902 Segmental and somatic dysfunction of thoracic region: Secondary | ICD-10-CM | POA: Diagnosis not present

## 2016-09-02 DIAGNOSIS — M9903 Segmental and somatic dysfunction of lumbar region: Secondary | ICD-10-CM | POA: Diagnosis not present

## 2016-09-02 DIAGNOSIS — M5442 Lumbago with sciatica, left side: Secondary | ICD-10-CM | POA: Diagnosis not present

## 2016-09-02 DIAGNOSIS — M9905 Segmental and somatic dysfunction of pelvic region: Secondary | ICD-10-CM | POA: Diagnosis not present

## 2016-09-02 DIAGNOSIS — M9902 Segmental and somatic dysfunction of thoracic region: Secondary | ICD-10-CM | POA: Diagnosis not present

## 2016-09-07 DIAGNOSIS — M5442 Lumbago with sciatica, left side: Secondary | ICD-10-CM | POA: Diagnosis not present

## 2016-09-07 DIAGNOSIS — M9905 Segmental and somatic dysfunction of pelvic region: Secondary | ICD-10-CM | POA: Diagnosis not present

## 2016-09-07 DIAGNOSIS — M9903 Segmental and somatic dysfunction of lumbar region: Secondary | ICD-10-CM | POA: Diagnosis not present

## 2016-09-07 DIAGNOSIS — M9902 Segmental and somatic dysfunction of thoracic region: Secondary | ICD-10-CM | POA: Diagnosis not present

## 2016-09-08 DIAGNOSIS — Z79899 Other long term (current) drug therapy: Secondary | ICD-10-CM | POA: Diagnosis not present

## 2016-09-08 DIAGNOSIS — M0589 Other rheumatoid arthritis with rheumatoid factor of multiple sites: Secondary | ICD-10-CM | POA: Diagnosis not present

## 2016-09-08 DIAGNOSIS — M65341 Trigger finger, right ring finger: Secondary | ICD-10-CM | POA: Diagnosis not present

## 2016-09-08 DIAGNOSIS — Z6821 Body mass index (BMI) 21.0-21.9, adult: Secondary | ICD-10-CM | POA: Diagnosis not present

## 2016-09-13 DIAGNOSIS — M9903 Segmental and somatic dysfunction of lumbar region: Secondary | ICD-10-CM | POA: Diagnosis not present

## 2016-09-13 DIAGNOSIS — M9905 Segmental and somatic dysfunction of pelvic region: Secondary | ICD-10-CM | POA: Diagnosis not present

## 2016-09-13 DIAGNOSIS — M5442 Lumbago with sciatica, left side: Secondary | ICD-10-CM | POA: Diagnosis not present

## 2016-09-13 DIAGNOSIS — M9902 Segmental and somatic dysfunction of thoracic region: Secondary | ICD-10-CM | POA: Diagnosis not present

## 2016-09-16 DIAGNOSIS — M5442 Lumbago with sciatica, left side: Secondary | ICD-10-CM | POA: Diagnosis not present

## 2016-09-16 DIAGNOSIS — M9902 Segmental and somatic dysfunction of thoracic region: Secondary | ICD-10-CM | POA: Diagnosis not present

## 2016-09-16 DIAGNOSIS — M9905 Segmental and somatic dysfunction of pelvic region: Secondary | ICD-10-CM | POA: Diagnosis not present

## 2016-09-16 DIAGNOSIS — M9903 Segmental and somatic dysfunction of lumbar region: Secondary | ICD-10-CM | POA: Diagnosis not present

## 2016-09-22 DIAGNOSIS — M9903 Segmental and somatic dysfunction of lumbar region: Secondary | ICD-10-CM | POA: Diagnosis not present

## 2016-09-22 DIAGNOSIS — M9902 Segmental and somatic dysfunction of thoracic region: Secondary | ICD-10-CM | POA: Diagnosis not present

## 2016-09-22 DIAGNOSIS — M9905 Segmental and somatic dysfunction of pelvic region: Secondary | ICD-10-CM | POA: Diagnosis not present

## 2016-09-22 DIAGNOSIS — M5442 Lumbago with sciatica, left side: Secondary | ICD-10-CM | POA: Diagnosis not present

## 2016-09-29 DIAGNOSIS — M5442 Lumbago with sciatica, left side: Secondary | ICD-10-CM | POA: Diagnosis not present

## 2016-09-29 DIAGNOSIS — M9902 Segmental and somatic dysfunction of thoracic region: Secondary | ICD-10-CM | POA: Diagnosis not present

## 2016-09-29 DIAGNOSIS — M9905 Segmental and somatic dysfunction of pelvic region: Secondary | ICD-10-CM | POA: Diagnosis not present

## 2016-09-29 DIAGNOSIS — M9903 Segmental and somatic dysfunction of lumbar region: Secondary | ICD-10-CM | POA: Diagnosis not present

## 2016-10-12 DIAGNOSIS — M5442 Lumbago with sciatica, left side: Secondary | ICD-10-CM | POA: Diagnosis not present

## 2016-10-12 DIAGNOSIS — M9902 Segmental and somatic dysfunction of thoracic region: Secondary | ICD-10-CM | POA: Diagnosis not present

## 2016-10-12 DIAGNOSIS — M9905 Segmental and somatic dysfunction of pelvic region: Secondary | ICD-10-CM | POA: Diagnosis not present

## 2016-10-12 DIAGNOSIS — M9903 Segmental and somatic dysfunction of lumbar region: Secondary | ICD-10-CM | POA: Diagnosis not present

## 2016-10-17 DIAGNOSIS — M9902 Segmental and somatic dysfunction of thoracic region: Secondary | ICD-10-CM | POA: Diagnosis not present

## 2016-10-17 DIAGNOSIS — M9905 Segmental and somatic dysfunction of pelvic region: Secondary | ICD-10-CM | POA: Diagnosis not present

## 2016-10-17 DIAGNOSIS — M9903 Segmental and somatic dysfunction of lumbar region: Secondary | ICD-10-CM | POA: Diagnosis not present

## 2016-10-17 DIAGNOSIS — M5442 Lumbago with sciatica, left side: Secondary | ICD-10-CM | POA: Diagnosis not present

## 2016-10-31 DIAGNOSIS — M9903 Segmental and somatic dysfunction of lumbar region: Secondary | ICD-10-CM | POA: Diagnosis not present

## 2016-10-31 DIAGNOSIS — M9902 Segmental and somatic dysfunction of thoracic region: Secondary | ICD-10-CM | POA: Diagnosis not present

## 2016-10-31 DIAGNOSIS — M5442 Lumbago with sciatica, left side: Secondary | ICD-10-CM | POA: Diagnosis not present

## 2016-10-31 DIAGNOSIS — M9905 Segmental and somatic dysfunction of pelvic region: Secondary | ICD-10-CM | POA: Diagnosis not present

## 2016-11-16 DIAGNOSIS — M9905 Segmental and somatic dysfunction of pelvic region: Secondary | ICD-10-CM | POA: Diagnosis not present

## 2016-11-16 DIAGNOSIS — M5442 Lumbago with sciatica, left side: Secondary | ICD-10-CM | POA: Diagnosis not present

## 2016-11-16 DIAGNOSIS — M9902 Segmental and somatic dysfunction of thoracic region: Secondary | ICD-10-CM | POA: Diagnosis not present

## 2016-11-16 DIAGNOSIS — M9903 Segmental and somatic dysfunction of lumbar region: Secondary | ICD-10-CM | POA: Diagnosis not present

## 2016-11-23 ENCOUNTER — Ambulatory Visit (INDEPENDENT_AMBULATORY_CARE_PROVIDER_SITE_OTHER): Payer: Medicare Other | Admitting: Family Medicine

## 2016-11-23 ENCOUNTER — Encounter: Payer: Self-pay | Admitting: Family Medicine

## 2016-11-23 VITALS — BP 150/92 | HR 92 | Temp 98.7°F | Ht 62.5 in | Wt 119.0 lb

## 2016-11-23 DIAGNOSIS — E039 Hypothyroidism, unspecified: Secondary | ICD-10-CM

## 2016-11-23 DIAGNOSIS — F314 Bipolar disorder, current episode depressed, severe, without psychotic features: Secondary | ICD-10-CM | POA: Diagnosis not present

## 2016-11-23 DIAGNOSIS — R399 Unspecified symptoms and signs involving the genitourinary system: Secondary | ICD-10-CM

## 2016-11-23 DIAGNOSIS — I10 Essential (primary) hypertension: Secondary | ICD-10-CM | POA: Insufficient documentation

## 2016-11-23 DIAGNOSIS — R5383 Other fatigue: Secondary | ICD-10-CM | POA: Diagnosis not present

## 2016-11-23 NOTE — Patient Instructions (Signed)
DASH Eating Plan DASH stands for "Dietary Approaches to Stop Hypertension." The DASH eating plan is a healthy eating plan that has been shown to reduce high blood pressure (hypertension). It may also reduce your risk for type 2 diabetes, heart disease, and stroke. The DASH eating plan may also help with weight loss. What are tips for following this plan? General guidelines  Avoid eating more than 2,300 mg (milligrams) of salt (sodium) a day. If you have hypertension, you may need to reduce your sodium intake to 1,500 mg a day.  Limit alcohol intake to no more than 1 drink a day for nonpregnant women and 2 drinks a day for men. One drink equals 12 oz of beer, 5 oz of wine, or 1 oz of hard liquor.  Work with your health care provider to maintain a healthy body weight or to lose weight. Ask what an ideal weight is for you.  Get at least 30 minutes of exercise that causes your heart to beat faster (aerobic exercise) most days of the week. Activities may include walking, swimming, or biking.  Work with your health care provider or diet and nutrition specialist (dietitian) to adjust your eating plan to your individual calorie needs. Reading food labels  Check food labels for the amount of sodium per serving. Choose foods with less than 5 percent of the Daily Value of sodium. Generally, foods with less than 300 mg of sodium per serving fit into this eating plan.  To find whole grains, look for the word "whole" as the first word in the ingredient list. Shopping  Buy products labeled as "low-sodium" or "no salt added."  Buy fresh foods. Avoid canned foods and premade or frozen meals. Cooking  Avoid adding salt when cooking. Use salt-free seasonings or herbs instead of table salt or sea salt. Check with your health care provider or pharmacist before using salt substitutes.  Do not fry foods. Cook foods using healthy methods such as baking, boiling, grilling, and broiling instead.  Cook with  heart-healthy oils, such as olive, canola, soybean, or sunflower oil. Meal planning   Eat a balanced diet that includes: ? 5 or more servings of fruits and vegetables each day. At each meal, try to fill half of your plate with fruits and vegetables. ? Up to 6-8 servings of whole grains each day. ? Less than 6 oz of lean meat, poultry, or fish each day. A 3-oz serving of meat is about the same size as a deck of cards. One egg equals 1 oz. ? 2 servings of low-fat dairy each day. ? A serving of nuts, seeds, or beans 5 times each week. ? Heart-healthy fats. Healthy fats called Omega-3 fatty acids are found in foods such as flaxseeds and coldwater fish, like sardines, salmon, and mackerel.  Limit how much you eat of the following: ? Canned or prepackaged foods. ? Food that is high in trans fat, such as fried foods. ? Food that is high in saturated fat, such as fatty meat. ? Sweets, desserts, sugary drinks, and other foods with added sugar. ? Full-fat dairy products.  Do not salt foods before eating.  Try to eat at least 2 vegetarian meals each week.  Eat more home-cooked food and less restaurant, buffet, and fast food.  When eating at a restaurant, ask that your food be prepared with less salt or no salt, if possible. What foods are recommended? The items listed may not be a complete list. Talk with your dietitian about what   dietary choices are best for you. Grains Whole-grain or whole-wheat bread. Whole-grain or whole-wheat pasta. Brown rice. Oatmeal. Quinoa. Bulgur. Whole-grain and low-sodium cereals. Pita bread. Low-fat, low-sodium crackers. Whole-wheat flour tortillas. Vegetables Fresh or frozen vegetables (raw, steamed, roasted, or grilled). Low-sodium or reduced-sodium tomato and vegetable juice. Low-sodium or reduced-sodium tomato sauce and tomato paste. Low-sodium or reduced-sodium canned vegetables. Fruits All fresh, dried, or frozen fruit. Canned fruit in natural juice (without  added sugar). Meat and other protein foods Skinless chicken or turkey. Ground chicken or turkey. Pork with fat trimmed off. Fish and seafood. Egg whites. Dried beans, peas, or lentils. Unsalted nuts, nut butters, and seeds. Unsalted canned beans. Lean cuts of beef with fat trimmed off. Low-sodium, lean deli meat. Dairy Low-fat (1%) or fat-free (skim) milk. Fat-free, low-fat, or reduced-fat cheeses. Nonfat, low-sodium ricotta or cottage cheese. Low-fat or nonfat yogurt. Low-fat, low-sodium cheese. Fats and oils Soft margarine without trans fats. Vegetable oil. Low-fat, reduced-fat, or light mayonnaise and salad dressings (reduced-sodium). Canola, safflower, olive, soybean, and sunflower oils. Avocado. Seasoning and other foods Herbs. Spices. Seasoning mixes without salt. Unsalted popcorn and pretzels. Fat-free sweets. What foods are not recommended? The items listed may not be a complete list. Talk with your dietitian about what dietary choices are best for you. Grains Baked goods made with fat, such as croissants, muffins, or some breads. Dry pasta or rice meal packs. Vegetables Creamed or fried vegetables. Vegetables in a cheese sauce. Regular canned vegetables (not low-sodium or reduced-sodium). Regular canned tomato sauce and paste (not low-sodium or reduced-sodium). Regular tomato and vegetable juice (not low-sodium or reduced-sodium). Pickles. Olives. Fruits Canned fruit in a light or heavy syrup. Fried fruit. Fruit in cream or butter sauce. Meat and other protein foods Fatty cuts of meat. Ribs. Fried meat. Bacon. Sausage. Bologna and other processed lunch meats. Salami. Fatback. Hotdogs. Bratwurst. Salted nuts and seeds. Canned beans with added salt. Canned or smoked fish. Whole eggs or egg yolks. Chicken or turkey with skin. Dairy Whole or 2% milk, cream, and half-and-half. Whole or full-fat cream cheese. Whole-fat or sweetened yogurt. Full-fat cheese. Nondairy creamers. Whipped toppings.  Processed cheese and cheese spreads. Fats and oils Butter. Stick margarine. Lard. Shortening. Ghee. Bacon fat. Tropical oils, such as coconut, palm kernel, or palm oil. Seasoning and other foods Salted popcorn and pretzels. Onion salt, garlic salt, seasoned salt, table salt, and sea salt. Worcestershire sauce. Tartar sauce. Barbecue sauce. Teriyaki sauce. Soy sauce, including reduced-sodium. Steak sauce. Canned and packaged gravies. Fish sauce. Oyster sauce. Cocktail sauce. Horseradish that you find on the shelf. Ketchup. Mustard. Meat flavorings and tenderizers. Bouillon cubes. Hot sauce and Tabasco sauce. Premade or packaged marinades. Premade or packaged taco seasonings. Relishes. Regular salad dressings. Where to find more information:  National Heart, Lung, and Blood Institute: www.nhlbi.nih.gov  American Heart Association: www.heart.org Summary  The DASH eating plan is a healthy eating plan that has been shown to reduce high blood pressure (hypertension). It may also reduce your risk for type 2 diabetes, heart disease, and stroke.  With the DASH eating plan, you should limit salt (sodium) intake to 2,300 mg a day. If you have hypertension, you may need to reduce your sodium intake to 1,500 mg a day.  When on the DASH eating plan, aim to eat more fresh fruits and vegetables, whole grains, lean proteins, low-fat dairy, and heart-healthy fats.  Work with your health care provider or diet and nutrition specialist (dietitian) to adjust your eating plan to your individual   calorie needs. This information is not intended to replace advice given to you by your health care provider. Make sure you discuss any questions you have with your health care provider. Document Released: 02/17/2011 Document Revised: 02/22/2016 Document Reviewed: 02/22/2016 Elsevier Interactive Patient Education  2017 [employer]  

## 2016-11-23 NOTE — Progress Notes (Signed)
Patient ID: Erica [name], female    DOB: [birth year], 83 y.o.   MRN: [id number]  Chief Complaint  Patient presents with  . Establish Care    Allergies Penicillins  Subjective:   Erica [name] is a 83 y.o. female who presents to [facility] today.  HPI Here as a new patient to establish care. Has previously been seen by Dr. Ouida Sills for years and would like a female doctor. Is followed by psychiatry for treatment resistant depression. Has received ECT and brain impulse therapy in the past. Has been on many medications. Reports that she is currently on emsam patch. Reports that she still does not feel great on it and would like to see her psychiatrist sooner. Would like for [place] to call and get her an appointment. Reports that she used to be on nardil and it worked much better for her but psychiatry did not want her on that medication anymore.   Reports that would like to have her urine checked. Reports that she does not have burning with urination but at times she might have some pressure. Wants to make sure she does not have an infection.  Reports that has had HTN for a long time but does not want to be on medication at this time. Has a history of high BP and reports that it goes up higher when she is at the doctor. However, she reports that when she is at home that it is not as high. Was on diuretic in the past and it made her BP go really down to where she was hypotensive. Is not interested in medication at this time.   Reports that has been a bit stressed b/c has a lot going on in her life. Reports that husband has dementia and she has had to take over the financial aspect of running their household. Reports that they do not drive but have a caregiver that helps them around the house and carries them to the places that they need to go. Reports that her husband's dementia can be stressful and hard to deal with. Reports that she is safe at home. Appetite is good. Reports that her  mood is better in the afternoons and evenings but not good in the morning. Reports that usually feels like she cannot get up out of bed but then always makes herself and then starts to feel better.   Has also dealt with RA for years. Stopped her methotrexate not to long ago and reports that she feels a bit better. Believes that the medication made her feel bad. Reports that pain is no different with or without the MTX and did not want to take it anymore.   Reports that has a long history of thyroid problem. Has been hypothyroid for years. Was on synthroid brand and feels like it worked better for her than the generic thyroid medication. Has not had it checked in a long time.    Hypertension  This is a chronic problem. The current episode started more than 1 year ago. The problem has been waxing and waning since onset. The problem is uncontrolled. Associated symptoms include anxiety and malaise/fatigue. Pertinent negatives include no blurred vision, chest pain, headaches, neck pain, orthopnea, palpitations, peripheral edema, PND, shortness of breath or sweats. Risk factors for coronary artery disease include post-menopausal state and stress. Past treatments include diuretics and lifestyle changes. The current treatment provides significant improvement. Compliance problems include medication side effects.  Identifiable causes of hypertension include  a thyroid problem.  Thyroid Problem  Presents for initial visit. Symptoms include anxiety, depressed mood and fatigue. Patient reports no cold intolerance, constipation, diaphoresis, diarrhea, dry skin, heat intolerance, hoarse voice, leg swelling, palpitations, tremors, visual change, weight gain or weight loss. The symptoms have been stable. Past treatments include levothyroxine. The treatment provided moderate relief. The following procedures have not been performed: thyroid ultrasound and thyroidectomy. There is no history of atrial fibrillation.    Past  Medical History:  Diagnosis Date  . Anxiety   . Depression    bipolar  . Hyperlipidemia   . Irritable bowel syndrome   . RA (rheumatoid arthritis) (HCC)   . Thyroid condition     Past Surgical History:  Procedure Laterality Date  . ABDOMINAL HYSTERECTOMY    . Bone Spurs  08/07/13   Right Shoulder  . COLONOSCOPY    . COLONOSCOPY N/A 05/08/2012   Procedure: COLONOSCOPY;  Surgeon: Malissa Hippo, MD;  Location: [facility];  Service: Endoscopy;  Laterality: N/A;  730  . COLOSTOMY    . ESOPHAGOGASTRODUODENOSCOPY N/A 11/04/2013   Procedure: ESOPHAGOGASTRODUODENOSCOPY (EGD);  Surgeon: Malissa Hippo, MD;  Location: [facility];  Service: Endoscopy;  Laterality: N/A;  730  . EYE SURGERY  cataract x 2  . MALONEY DILATION N/A 11/04/2013   Procedure: Elease Hashimoto DILATION;  Surgeon: Malissa Hippo, MD;  Location: [facility];  Service: Endoscopy;  Laterality: N/A;  . UPPER GASTROINTESTINAL ENDOSCOPY      Family History  Problem Relation Age of Onset  . Colon cancer Mother   . Bipolar disorder Mother   . Anxiety disorder Mother   . Atrial fibrillation Son   . Depression Maternal Grandmother      Social History   Social History  . Marital status: Married    Spouse name: N/A  . Number of children: N/A  . Years of education: N/A   Social History Main Topics  . Smoking status: Former Smoker    Packs/day: 1.50    Years: 27.00    Types: Cigarettes    Quit date: 04/18/1981  . Smokeless tobacco: Former Neurosurgeon    Quit date: 11/27/1980  . Alcohol use No  . Drug use: No  . Sexual activity: No   Other Topics Concern  . None   Social History Narrative  . None    Outpatient Medications Prior to Visit  Medication Sig Dispense Refill  . Cyanocobalamin (VITAMIN B-12 PO) Take by mouth daily.    . folic acid (FOLVITE) 1 MG tablet Take 1 mg by mouth at bedtime.     . gabapentin (NEURONTIN) 100 MG capsule Take 100 mg by mouth 3 (three) times daily.   0  . levothyroxine (SYNTHROID,  LEVOTHROID) 88 MCG tablet Take 88 mcg by mouth daily before breakfast.   11  . pantoprazole (PROTONIX) 40 MG tablet TAKE ONE TABLET BY MOUTH DAILY BEFORE BREAKFAST. 30 tablet 5  . EMSAM 6 MG/24HR Place 6 mg onto the skin daily.   0  . lithium carbonate 150 MG capsule Take 150 mg by mouth. Patient takes 1 per day prior to meal.  1  . Methotrexate, PF, 10 MG/0.4ML SOAJ Inject 0.4 mLs into the skin once a week. saturday     No facility-administered medications prior to visit.     Review of Systems  Constitutional: Positive for fatigue and malaise/fatigue. Negative for activity change, appetite change, chills, diaphoresis, weight gain and weight loss.  HENT: Negative for hoarse  voice.   Eyes: Negative for blurred vision.  Respiratory: Negative for cough, chest tightness and shortness of breath.   Cardiovascular: Negative for chest pain, palpitations, orthopnea and PND.  Gastrointestinal: Negative for constipation, diarrhea, nausea and vomiting.  Endocrine: Negative for cold intolerance, heat intolerance and polyuria.  Genitourinary: Negative for dysuria, flank pain, frequency, hematuria and urgency.  Musculoskeletal: Negative for neck pain.  Skin:       Reports that the area where she puts the patch tends to get a little irritated and red. Has been having this problems for a long time. Reports that if she puts the patch on her thigh that it does not cause the rash as bad but she feels that the medication does not work as much. Has tried to put some cortisone cream at the patch site but it does not help. Was told she could cut the patch in half and that might make a difference, but she did not think she should cut the patch in half.   Neurological: Negative for tremors, syncope, speech difficulty, weakness, numbness and headaches.  Psychiatric/Behavioral: Positive for dysphoric mood. Negative for confusion, decreased concentration, sleep disturbance and suicidal ideas. The patient is  nervous/anxious.      Objective:   BP (!) 168/100   Pulse (!) 111   Temp 98.7 F (37.1 C) (Tympanic)   Ht 5' 2.5" (1.588 m)   Wt 119 lb (54 kg)   SpO2 97%   BMI 21.42 kg/m   Physical Exam  Constitutional: She appears well-developed and well-nourished. No distress.  HENT:  Head: Normocephalic and atraumatic.  Eyes: Pupils are equal, round, and reactive to light. EOM are normal.  Neck: Normal range of motion. Neck supple. No JVD present. No tracheal deviation present. No thyromegaly present.  Cardiovascular: Regular rhythm and normal heart sounds.   Heart Rate 92  Pulmonary/Chest: Effort normal and breath sounds normal.  Musculoskeletal: She exhibits deformity.  Bony deformities of hands/fingers bilaterally, right hand  Worse. Consistent with RA.   Lymphadenopathy:    She has no cervical adenopathy.  Skin: Skin is warm and dry. There is erythema.  Erythematous area where patch has been applied to the skin, no skin breakdown. No edema or induration.   Vitals reviewed.    Assessment and Plan  1. Acquired hypothyroidism Check TSH. Adjust dose as needed. When review labs, if medication is needed, change to brand only. Discussed with patient the correct time/way to take thyroid medication.   2. Fatigue, unspecified type Suspect secondary to mood disorder. Check labs.  - CBC with Differential/Platelet - TSH - Hemoglobin A1c - Basic metabolic panel - Vitamin D (25 hydroxy)  3. Lower urinary tract symptoms Patient would like to make sure does not have UTI. Ordered.  - Urinalysis, dipstick only - Urine Culture  4. Bipolar disorder with severe depression (HCC) Discussed mood with patient and life stressors. Patient would like for me to call psychiatrist and see if they will see her sooner. In addition, I will tell psychiatrist about skin reaction to patches, suspect secondary to adhesive. Patient counseled that if rash spreads, changes, worsens, or becomes painful or she has  concerns to call or return to clinic.  Patient counseled regarding mood and will continue to follow up with psychiatry, Dr. Tonna Boehringer [phone].   5. Hypertension, unspecified type Suspect that patient has hypertension that can be exacerbated by doctors appointments/ medical treatment. Discussed white coat syndrome. Patient is going to check BP at home and record.  In addition, she is going to bring in her cuff to the visit for [place] to confirm that the cuff is accurate. Patient understands the risks of uncontrolled BP, including AMI, CVD, CVA, etc. She voiced understanding. She defers trial of medication at this time. Patient will record and bring in readings with in 2-4 weeks or sooner if consistently running high.  Recommendations discussed include consuming low-fat daily products, poultry, fish, legumes, non-tropical vegetable oils, and nuts; and limiting intake of sweets, sugar-sweetened beverages, and red meat. Discussed following a plan such as the Dietary Approaches to Stop Hypertension (DASH) diet. Patient to read up on this diet.   6.  RA-follow up with Rheumatologist as directed.   Patient defers influenza shot today.  Reports that she has had the pneumonia shots. Patient will have records sent to our office from Dr. Ouida Sills and other providers.  Call with questions, concerns, or worrisome s/s.           Aliene Beams, MD 11/23/2016

## 2016-11-24 ENCOUNTER — Encounter: Payer: Self-pay | Admitting: Family Medicine

## 2016-11-24 ENCOUNTER — Telehealth: Payer: Self-pay | Admitting: Family Medicine

## 2016-11-24 MED ORDER — AMLODIPINE BESYLATE 2.5 MG PO TABS: 2.5000 mg | ORAL_TABLET | Freq: Every day | ORAL | 1 refills | Status: DC
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
## 2016-11-24 NOTE — Telephone Encounter (Signed)
Patient aware of medication and appointments.

## 2016-11-24 NOTE — Telephone Encounter (Signed)
Patient called in this morning to let you know her blood pressure was 180/107, she said she changed her mind and would like some medication to control this. She uses [facility].  Let me know if you need me to do anything.  CB#: [phone]

## 2016-11-28 DIAGNOSIS — R5383 Other fatigue: Secondary | ICD-10-CM | POA: Diagnosis not present

## 2016-11-28 DIAGNOSIS — R399 Unspecified symptoms and signs involving the genitourinary system: Secondary | ICD-10-CM | POA: Diagnosis not present

## 2016-11-29 ENCOUNTER — Encounter (INDEPENDENT_AMBULATORY_CARE_PROVIDER_SITE_OTHER): Payer: Self-pay | Admitting: Internal Medicine

## 2016-11-29 ENCOUNTER — Ambulatory Visit (INDEPENDENT_AMBULATORY_CARE_PROVIDER_SITE_OTHER): Payer: Medicare Other | Admitting: Internal Medicine

## 2016-11-29 VITALS — BP 132/90 | HR 108 | Temp 97.8°F | Resp 18 | Ht 62.0 in | Wt 118.7 lb

## 2016-11-29 DIAGNOSIS — K580 Irritable bowel syndrome with diarrhea: Secondary | ICD-10-CM

## 2016-11-29 DIAGNOSIS — K219 Gastro-esophageal reflux disease without esophagitis: Secondary | ICD-10-CM | POA: Diagnosis not present

## 2016-11-29 LAB — URINALYSIS, DIPSTICK ONLY
Bilirubin, UA: NEGATIVE
Glucose, UA: NEGATIVE
Ketones, UA: NEGATIVE
NITRITE UA: NEGATIVE
PH UA: 6.5 (ref 5.0–7.5)
PROTEIN UA: NEGATIVE
RBC UA: NEGATIVE
SPEC GRAV UA: 1.014 (ref 1.005–1.030)
Urobilinogen, Ur: 0.2 mg/dL (ref 0.2–1.0)

## 2016-11-29 MED ORDER — PANTOPRAZOLE SODIUM 40 MG PO TBEC: DELAYED_RELEASE_TABLET | ORAL | 5 refills | Status: DC

## 2016-11-29 NOTE — Progress Notes (Signed)
Presenting complaint;  Follow-up for GERD and IBS.  Subjective:  Patient is 83 year old Caucasian female who is in for scheduled visit. She was last seen about 6 months ago. She states she is doing well from GI standpoint. She rarely has heartburn. She denies dysphagia. She also denies nausea vomiting abdominal pain. She says she generally has 1 formed stool daily. However if if she is upset she will have diarrhea. Her appetite is fair. She has not lost any weight since her last visit. She is not seeing Dr. Tracie Harrier whom she has known for many years. She feels the depression is not well controlled. She is going back on Nardil starting next week. She is under care of Dr. Evelene Croon. She decided to stop methotrexate on her own.   Current Medications: Outpatient Encounter Prescriptions as of 11/29/2016  Medication Sig  . acetaminophen (TYLENOL) 650 MG CR tablet Take 1,300 mg by mouth daily.  Marland Kitchen amLODipine (NORVASC) 2.5 MG tablet Take 1 tablet (2.5 mg total) by mouth daily.  . Cyanocobalamin (VITAMIN B-12 PO) Take by mouth daily.  . EMSAM 9 MG/24HR Place 9 mg onto the skin. Patient states that she was told by the ordering MD do cut this patch in half. She is cutting dose down to be taken off.  . folic acid (FOLVITE) 1 MG tablet Take 1 mg by mouth at bedtime.   . gabapentin (NEURONTIN) 100 MG capsule Take 100 mg by mouth 3 (three) times daily.   Marland Kitchen levothyroxine (SYNTHROID, LEVOTHROID) 88 MCG tablet Take 88 mcg by mouth daily before breakfast.   . pantoprazole (PROTONIX) 40 MG tablet TAKE ONE TABLET BY MOUTH DAILY BEFORE BREAKFAST.   No facility-administered encounter medications on file as of 11/29/2016.      Objective: Blood pressure 132/90, pulse (!) 108, temperature 97.8 F (36.6 C), temperature source Oral, resp. rate 18, height 5\' 2"  (1.575 m), weight 118 lb 11.2 oz (53.8 kg). Patient is alert and in no acute distress. Conjunctiva is pink. Sclera is nonicteric Oropharyngeal mucosa is normal. No  neck masses or thyromegaly noted. Cardiac exam with regular rhythm normal S1 and S2. No murmur or gallop noted. Lungs are clear to auscultation. Abdomen is full. On palpation is soft and nontender without organomegaly or masses.  No LE edema or clubbing noted. She has bilateral Dupuytren's contractors worse in the right hand. She also has swollen metacarpophalangeal joints with ulnar deviation.   Assessment:  #1. Chronic GERD. She is doing well with therapy. Her esophagus was dilated about 3 years ago. She is not having any side effects with PPI. Will consider dropping PPI dose when her depression is well controlled.  #2. IBS. She has had diarrhea and urgency for years but lately she has been doing very well with dietary measure.  #3. History of weight loss felt to be secondary to diminished calorie intake. Weight loss has leveled off. She has not lost any weight in the last 6 months.  Plan:  Continue pantoprazole at 40 mg by mouth every morning. Office visit in 6 months.

## 2016-11-29 NOTE — Patient Instructions (Signed)
Call if you have swallowing difficulty.

## 2016-11-30 DIAGNOSIS — M5442 Lumbago with sciatica, left side: Secondary | ICD-10-CM | POA: Diagnosis not present

## 2016-11-30 DIAGNOSIS — M9903 Segmental and somatic dysfunction of lumbar region: Secondary | ICD-10-CM | POA: Diagnosis not present

## 2016-11-30 DIAGNOSIS — M9905 Segmental and somatic dysfunction of pelvic region: Secondary | ICD-10-CM | POA: Diagnosis not present

## 2016-11-30 DIAGNOSIS — M9902 Segmental and somatic dysfunction of thoracic region: Secondary | ICD-10-CM | POA: Diagnosis not present

## 2016-11-30 NOTE — Progress Notes (Signed)
Patient ID: Erica [name], female    DOB: 1933-01-10, 83 y.o.   MRN: [id number]  Chief Complaint  Patient presents with  . Hypertension    Allergies Penicillins  Subjective:   Erica [name] is a 83 y.o. female who presents to [facility] today.  HPI Here to follow up on BP. Has been taking the norvasc each day as directed and BP has been running better. Reports that over the past several days BP has finally come down to the normal range. Checks it at home. Has not had any side effects with the medication.   Reports that she did get a call from her psychiatrist and was worked in for a sooner appointment. Reports that she walked in the room and the psychiatrist told her that she could be put on the nardil if that is what she wanted. Patient is here today with her daughter who reports that the psychiatrist was very abrupt and not kind. Did not even want to discuss it with her but just said she could have it if she wanted. She was given a script for the nardil and told how to wean off the emsam. Reports that did the nardil for one day with the emsam as the psychiatrist recommended and felt so bad that could not take another one of the nardil. Has gone back to the previous dose of emsam as the psychiatrist had previously had her on. The psychiatrist had told her in the past to cut the patch in half b/c it was causing a skin reaction. Had been on a whole patch. Cutting the patch in half does not seem to help the skin reaction but just feels like gets less response from the medication. Reports that the interaction with the psychiatrist was very unpleasant last time. She reports that she was made to feel like she was a spoiled brat and that she was entitled for wanting to change medication. Patient reports that she had seen this psychiatrist for the past six or seven months b/c her previous psychiatrist retired, whom she had seen for over a decade. The current psychiatrist told her that  if the emsam or nardil did not work that she needed to go back and have ECT. Patient reports that she will not do ECT again that she is 86 and it is probably not good for her health. Patient reports that her mood was not the best that it had ever been when she is on the emsam, but that it is also not the worst. Does not feel suicidal. Is functioning at home and dealing with the stress of her husbands dementia while managing all the finances. Patient is accompanied by  Her daugther today [name]) who reports that her mother is doing pretty good but should probably be back on the higher dose. Patient would like to go back to the higher dose of emsam, but does not want to go back and see Dr. Gracy Racer psychiatrist and would like another psychiatrist.  Patient is sleeping well for the most part.   Patient did not get labs done next door to our office but went to [facility] on [address].   Reports that patient and daughter have done some reading about skin reactions and irritation to the emsam patch. Report that has been reports that some nasal steroid spray to the area before application of the patch lessened irritation by the adhesive in the patch. They would like to try this.    Hypertension  This is a chronic problem. The current episode started more than 1 year ago. The problem has been waxing and waning since onset. The problem is controlled. Pertinent negatives include no blurred vision, chest pain, headaches, neck pain, orthopnea, palpitations, peripheral edema, PND or shortness of breath. There are no associated agents to hypertension. Risk factors for coronary artery disease include post-menopausal state, stress and sedentary lifestyle. The current treatment provides significant improvement. There are no compliance problems.  There is no history of angina, kidney disease, CAD/MI, CVA or heart failure. There is no history of a thyroid problem.    Past Medical History:  Diagnosis Date  .  Anxiety   . Depression    bipolar  . Hyperlipidemia   . Irritable bowel syndrome   . RA (rheumatoid arthritis) (HCC)   . Thyroid condition     Past Surgical History:  Procedure Laterality Date  . ABDOMINAL HYSTERECTOMY    . Bone Spurs  08/07/13   Right Shoulder  . COLONOSCOPY    . COLONOSCOPY N/A 05/08/2012   Procedure: COLONOSCOPY;  Surgeon: Malissa Hippo, MD;  [facility];  Service: Endoscopy;  Laterality: N/A;  730  . COLOSTOMY    . ESOPHAGOGASTRODUODENOSCOPY N/A 11/04/2013   Procedure: ESOPHAGOGASTRODUODENOSCOPY (EGD);  Surgeon: Malissa Hippo, MD;  [facility];  Service: Endoscopy;  Laterality: N/A;  730  . EYE SURGERY  cataract x 2  . MALONEY DILATION N/A 11/04/2013   Procedure: Elease Hashimoto DILATION;  Surgeon: Malissa Hippo, MD;  [facility];  Service: Endoscopy;  Laterality: N/A;  . UPPER GASTROINTESTINAL ENDOSCOPY      Family History  Problem Relation Age of Onset  . Colon cancer Mother   . Bipolar disorder Mother   . Anxiety disorder Mother   . Atrial fibrillation Son   . Depression Maternal Grandmother      Social History   Social History  . Marital status: Married    Spouse name: N/A  . Number of children: N/A  . Years of education: N/A   Social History Main Topics  . Smoking status: Former Smoker    Packs/day: 1.50    Years: 27.00    Types: Cigarettes    Quit date: 04/18/1981  . Smokeless tobacco: Former Neurosurgeon    Quit date: 11/27/1980  . Alcohol use No  . Drug use: No  . Sexual activity: No   Other Topics Concern  . None   Social History Narrative  . None    Review of Systems  Eyes: Negative for blurred vision.  Respiratory: Negative for cough and shortness of breath.   Cardiovascular: Negative for chest pain, palpitations, orthopnea and PND.  Gastrointestinal: Negative for abdominal pain, nausea and vomiting.  Endocrine: Negative for polydipsia and polyphagia.  Genitourinary: Negative for difficulty  urinating, dysuria, frequency, hematuria and urgency.  Musculoskeletal: Negative for neck pain.  Skin: Negative for wound.  Neurological: Negative for syncope, numbness and headaches.  Hematological: Negative for adenopathy.  Psychiatric/Behavioral: Positive for dysphoric mood. Negative for agitation, behavioral problems, confusion, decreased concentration, hallucinations, self-injury, sleep disturbance and suicidal ideas. The patient is not nervous/anxious and is not hyperactive.      Objective:   BP 128/78 (BP Location: Left Arm, Patient Position: Sitting, Cuff Size: Normal)   Pulse (!) 111   Temp 98.2 F (36.8 C) (Other (Comment))   Resp 16   Ht  (1.575 m)   Wt 118 lb 12 oz (53.9 kg)   SpO2  100%   BMI 21.72 kg/m   Physical Exam  Constitutional: She is oriented to person, place, and time. She appears well-developed and well-nourished.  Eyes: Pupils are equal, round, and reactive to light. EOM are normal. No scleral icterus.  Neck: Normal range of motion. Neck supple. No thyromegaly present.  Cardiovascular: Normal rate and regular rhythm.   Pulmonary/Chest: Effort normal and breath sounds normal.  Neurological: She is alert and oriented to person, place, and time. No cranial nerve deficit.  Skin: Skin is warm and dry.  Psychiatric: Her speech is normal and behavior is normal. Judgment and thought content normal. Her mood appears not anxious. Her affect is not angry, not blunt, not labile and not inappropriate. She is not agitated and not withdrawn. Thought content is not paranoid and not delusional. Cognition and memory are normal. She does not express impulsivity or inappropriate judgment. She expresses no homicidal and no suicidal ideation. She expresses no suicidal plans and no homicidal plans. She exhibits normal recent memory and normal remote memory.  Patient seems normal mood and affect but exasperated and frustrated with the situation surrounding her medication. She has  logical thought. Well dressed and groomed. No abnormal behaviors or thought patterns.   Vitals reviewed.    Assessment and Plan  1. Hypertension, unspecified type Continue the Norvasc as directed.  Lifestyle modifications discussed with patient including a diet emphasizing vegetables, fruits, and whole grains. Limiting intake of sodium to less than 2,400 mg per day.  Recommendations discussed include consuming low-fat dairy products, poultry, fish, legumes, non-tropical vegetable oils, and nuts; and limiting intake of sweets, sugar-sweetened beverages, and red meat. Discussed following a plan such as the Dietary Approaches to Stop Hypertension (DASH) diet. Patient to read up on this diet.    2. Depression, recurrent (HCC) Patient defers returning to Dr. Crecencio Mc. Reports that she does not trust her or believe it would be a good therapeutic relationship. Would like recommendations on psychiatrist in the area. Would like my opinion on Dr. Saddie Benders, a geriatric psychiatrist in [place], [place]. I told patient that I would call his office tomorrow and then let her know my opinion. In addition, patient reports that her daughter in law was going to give her some names of psychiatrist in the area.  At this time after long discussion regarding the risks vs benefits. We will reill the emsam 6 mg/24 hours. Patient and daughter understand the risks and use of this medication as she has been on it for a long time. She will d/c the emsam 9mg /24 hours, 1/2 patch per day and change to the 6 mg/24 hour patch.  We discussed the steroid nasal spray to skin area prior to patch application, and she wishes to try this. We discussed that this was rather unorthodox, but do not believe that it would be harmful.  Suicide risks evaluated and documented in note if present or in the area below. Patient does not have risks at this time.   Patient does not have/denies the following risks :  history of alcohol or substance  abuse disorder, recent loss of a loved one, or severe hopelessness. Patient denies access to firearms or if present will have removed from home/access.   Patient has protective factors of family and community support.  Patient reports that family believes is behaving rationally. Patient displays problem solving skills.   Patient specifically denies suicide ideation. Patient has access/information to healthcare contacts if situation or mood changes where patient is a risk to  self or others or mood becomes unstable.   During the encounter, the patient had good eye contact and firm handshake regarding safety contract and agreement to seek help if mood worsens and not to harm self.   Patient understands the treatment plan and is in agreement. Agrees to keep follow up in office in 2 weeks or sooner if needed and she agrees to call or return to clinic sooner if needed.   We will request labs from [facility] on [address].   Elise Benne [phone] is patients daughter who was present for visit and in agreement with the plan.   No Follow-up on file. Aliene Beams, MD 12/07/2016

## 2016-12-06 ENCOUNTER — Ambulatory Visit: Payer: Self-pay | Admitting: Family Medicine

## 2016-12-07 ENCOUNTER — Ambulatory Visit (INDEPENDENT_AMBULATORY_CARE_PROVIDER_SITE_OTHER): Payer: Medicare Other | Admitting: Family Medicine

## 2016-12-07 ENCOUNTER — Encounter: Payer: Self-pay | Admitting: Family Medicine

## 2016-12-07 VITALS — BP 128/78 | HR 111 | Temp 98.2°F | Resp 16 | Ht 62.0 in | Wt 118.8 lb

## 2016-12-07 DIAGNOSIS — F339 Major depressive disorder, recurrent, unspecified: Secondary | ICD-10-CM

## 2016-12-07 DIAGNOSIS — I10 Essential (primary) hypertension: Secondary | ICD-10-CM

## 2016-12-07 MED ORDER — SELEGILINE 6 MG/24HR TD PT24: 6.0000 mg | MEDICATED_PATCH | Freq: Every day | TRANSDERMAL | 12 refills | Status: DC

## 2016-12-08 ENCOUNTER — Telehealth: Payer: Self-pay | Admitting: Family Medicine

## 2016-12-08 NOTE — Telephone Encounter (Signed)
Please call and get patients labs from [facility] on [address].

## 2016-12-09 ENCOUNTER — Encounter: Payer: Self-pay | Admitting: Family Medicine

## 2016-12-09 LAB — CBC WITH DIFFERENTIAL/PLATELET
BASOS ABS: 0
Eosinophils Absolute: 0
HEMATOCRIT: 45
Hemoglobin: 14.7
Lymphs(Absolute): 0.6
MCH: 31.6
MCHC: 32.8
MCV: 96
Monocyes absolute: 0.6 10*3/uL (ref 0.1–1)
NEUTROPHILS ABSOLUTE (KUC): 3.2 10*3/uL (ref 1.7–7.8)
Platelets: 227
RBC: 4.65
RDW: 12.7
WBC: 5.9

## 2016-12-09 LAB — VITAMIN D 25 HYDROXY (VIT D DEFICIENCY, FRACTURES): Vit D, 25-Hydroxy: 20.8

## 2016-12-09 LAB — BASIC METABOLIC PANEL
BUN / CREAT RATIO: 17
BUN: 11
CO2: 22
CREATININE: 0.63
Calcium: 9.1
Chloride: 101
GFR, Est AFR Am: 97
GFR, Estimated: 84
GLUCOSE: 139
Potassium: 4
Sodium: 141

## 2016-12-09 LAB — URINE CULTURE

## 2016-12-09 LAB — HEMOGLOBIN A1C: A1c: 5.7

## 2016-12-09 LAB — TSH: TSH: 1.7

## 2016-12-09 NOTE — Telephone Encounter (Signed)
Patient informed of message below, verbalized understanding.  Labs on your desk

## 2016-12-09 NOTE — Telephone Encounter (Signed)
Please see note prior requesting labs. Also, please advise patient that I called [facility], and I think that she should see Dr. Donell Beers for office visit. She can call and schedule visit, they should be able to see her by the end of October, and she call us if having any problems before them. Please let me know if she has any questions. Tell her we are waiting on the labs and will let her know when we get them back. Thanks. Janine Limbo. Tracie Harrier, MD

## 2016-12-09 NOTE — Telephone Encounter (Signed)
Labs were manually abstracted in as sending them for scan can take a while.

## 2016-12-09 NOTE — Telephone Encounter (Signed)
Can you scan in labs and route to me for review. Thanks. Janine Limbo. Tracie Harrier, MD

## 2016-12-13 ENCOUNTER — Encounter: Payer: Self-pay | Admitting: Family Medicine

## 2016-12-13 ENCOUNTER — Telehealth: Payer: Self-pay | Admitting: Family Medicine

## 2016-12-13 NOTE — Telephone Encounter (Signed)
Called patient back and told her that I had discussed with Dr. Kayren Eaves office staff. She reports that she does not know if she wants to see him. Reports that she had looked him up on the internet and does not know if she wants to try to see him. Would like to maybe go back to Dr. Crecencio Mc. I told patient that I would agree with her seeing Dr. Crecencio Mc or going to see the new psychiatrist. She reports that she just has a lot going on and does not want the stress of seeing a new doctor. She is going to follow up with Dr. Crecencio Mc.

## 2016-12-13 NOTE — Telephone Encounter (Signed)
Patient is requesting a call from DrHagler about her referral to DrPlotsky she says she doesn't think she is going to see him and insists on speaking with DrHagler she will not go into detail  Cb#: [phone]

## 2016-12-21 ENCOUNTER — Ambulatory Visit (INDEPENDENT_AMBULATORY_CARE_PROVIDER_SITE_OTHER): Payer: Medicare Other | Admitting: Family Medicine

## 2016-12-21 ENCOUNTER — Encounter: Payer: Self-pay | Admitting: Family Medicine

## 2016-12-21 VITALS — BP 156/94 | HR 96 | Temp 97.8°F | Resp 16 | Ht 62.0 in | Wt 119.1 lb

## 2016-12-21 DIAGNOSIS — I10 Essential (primary) hypertension: Secondary | ICD-10-CM | POA: Diagnosis not present

## 2016-12-21 DIAGNOSIS — Z23 Encounter for immunization: Secondary | ICD-10-CM

## 2016-12-21 DIAGNOSIS — E039 Hypothyroidism, unspecified: Secondary | ICD-10-CM | POA: Diagnosis not present

## 2016-12-21 DIAGNOSIS — F419 Anxiety disorder, unspecified: Secondary | ICD-10-CM | POA: Diagnosis not present

## 2016-12-21 DIAGNOSIS — F339 Major depressive disorder, recurrent, unspecified: Secondary | ICD-10-CM | POA: Diagnosis not present

## 2016-12-21 MED ORDER — SYNTHROID 88 MCG PO TABS
88.0000 ug | ORAL_TABLET | Freq: Every day | ORAL | 2 refills | Status: DC
Start: 2016-12-21 — End: 2017-01-31

## 2016-12-21 MED ORDER — AMLODIPINE BESYLATE 2.5 MG PO TABS: 2.5000 mg | ORAL_TABLET | Freq: Two times a day (BID) | ORAL | 1 refills | Status: DC

## 2016-12-21 MED ORDER — LORAZEPAM 0.5 MG PO TABS: ORAL_TABLET | ORAL | 1 refills | Status: DC

## 2016-12-21 NOTE — Patient Instructions (Signed)
Flu shot needed.

## 2016-12-21 NOTE — Progress Notes (Signed)
Patient ID: Erica [name], female    DOB: 31-May-1932, 83 y.o.   MRN: [id number]  Chief Complaint  Patient presents with  . Follow-up    Allergies Penicillins  Subjective:   Erica [name] is a 83 y.o. female who presents to [facility] today.  HPI Here for follow up. Patient reports that she does not always feel great and has a lot of stress in her life. Husband is very ill with congestive heart failure, diabetes, and dementia. Patient has to take care of many of the details around the house. Patient does not drive but has a caregiver who drives her. Reports that she does have an upcoming appointment with Dr. Evelene Croon on 01/12/17.  Has been checking her blood pressure at home. BP at home is still running elevated in the 150-160s/80-90s. She reports that she has been taking the Novasc 2.5 mg by mouth daily. Denies any shortess of breath or swelling in her legs.   Has increase the patch that she uses for her mood to 6 mg a day. Denies any side effects or problems. She reports she is taking B12, vitamin D, and a multivitamin each day. Appetite is good. Energy good. Does get tired. Reports that initially feels bad in the morning and then feels better as the day progresses. Denies any suicidal or homicidal ideations. Brings in her bottle of lorazepam that she has had for over a year. The bilateral reports that the medication will expire in a month. Patient was given 90 pills and has over 60 of them left.    Past Medical History:  Diagnosis Date  . Anxiety   . Depression    bipolar  . Hyperlipidemia   . Irritable bowel syndrome   . RA (rheumatoid arthritis) (HCC)   . Thyroid condition     Past Surgical History:  Procedure Laterality Date  . ABDOMINAL HYSTERECTOMY    . Bone Spurs  08/07/13   Right Shoulder  . COLONOSCOPY    . COLONOSCOPY N/A 05/08/2012   Procedure: COLONOSCOPY;  Surgeon: Erica Hippo, MD;  [facility];  Service: Endoscopy;  Laterality:  N/A;  730  . COLOSTOMY    . ESOPHAGOGASTRODUODENOSCOPY N/A 11/04/2013   Procedure: ESOPHAGOGASTRODUODENOSCOPY (EGD);  Surgeon: Erica Hippo, MD;  [facility];  Service: Endoscopy;  Laterality: N/A;  730  . EYE SURGERY  cataract x 2  . MALONEY DILATION N/A 11/04/2013   Procedure: Elease Hashimoto DILATION;  Surgeon: Erica Hippo, MD;  [facility];  Service: Endoscopy;  Laterality: N/A;  . UPPER GASTROINTESTINAL ENDOSCOPY      Family History  Problem Relation Age of Onset  . Colon cancer Mother   . Bipolar disorder Mother   . Anxiety disorder Mother   . Atrial fibrillation Son   . Depression Maternal Grandmother      Social History   Social History  . Marital status: Married    Spouse name: N/A  . Number of children: N/A  . Years of education: N/A   Social History Main Topics  . Smoking status: Former Smoker    Packs/day: 1.50    Years: 27.00    Types: Cigarettes    Quit date: 04/18/1981  . Smokeless tobacco: Former Neurosurgeon    Quit date: 11/27/1980  . Alcohol use No  . Drug use: No  . Sexual activity: No   Other Topics Concern  . None   Social History Narrative  . None  Current Outpatient Prescriptions on File Prior to Visit  Medication Sig Dispense Refill  . acetaminophen (TYLENOL) 650 MG CR tablet Take 1,300 mg by mouth daily.    . Cyanocobalamin (VITAMIN B-12 PO) Take by mouth daily.    . folic acid (FOLVITE) 1 MG tablet Take 1 mg by mouth at bedtime.     . gabapentin (NEURONTIN) 100 MG capsule Take 100 mg by mouth 3 (three) times daily.   0  . pantoprazole (PROTONIX) 40 MG tablet TAKE ONE TABLET BY MOUTH DAILY BEFORE BREAKFAST. 30 tablet 5  . selegiline (EMSAM) 6 MG/24HR Place 1 patch (6 mg total) onto the skin daily. 30 patch 12   No current facility-administered medications on file prior to visit.     Review of Systems  Constitutional: Positive for fatigue. Negative for activity change, appetite change and fever.  Eyes: Negative for visual  disturbance.  Respiratory: Negative for cough, chest tightness and shortness of breath.   Cardiovascular: Negative for chest pain, palpitations and leg swelling.  Gastrointestinal: Negative for abdominal pain, nausea and vomiting.  Genitourinary: Negative for dysuria, frequency and urgency.  Neurological: Negative for dizziness, syncope and light-headedness.  Hematological: Negative for adenopathy.     Objective:   BP 140/82 (BP Location: Right Arm, Patient Position: Sitting, Cuff Size: Normal)   Pulse 100   Temp 97.8 F (36.6 C) (Temporal)   Resp 16   Ht 5\' 2"  (1.575 m)   Wt 119 lb 1.9 oz (54 kg)   SpO2 97%   BMI 21.79 kg/m   Physical Exam  Constitutional: She is oriented to person, place, and time. She appears well-developed and well-nourished. No distress.  HENT:  Head: Normocephalic and atraumatic.  Eyes: Pupils are equal, round, and reactive to light.  Neck: Normal range of motion. Neck supple. No thyromegaly present.  Cardiovascular: Normal rate, regular rhythm and normal heart sounds.   Pulmonary/Chest: Effort normal and breath sounds normal. No respiratory distress.  Neurological: She is alert and oriented to person, place, and time. No cranial nerve deficit.  Skin: Skin is warm and dry.  Psychiatric: Her speech is normal and behavior is normal. Judgment and thought content normal. Her mood appears anxious. She is not agitated, not aggressive, not slowed and not withdrawn. Thought content is not paranoid and not delusional. Cognition and memory are normal. Cognition and memory are not impaired. She does not express impulsivity or inappropriate judgment. She expresses no homicidal and no suicidal ideation. She expresses no suicidal plans and no homicidal plans. She exhibits normal recent memory and normal remote memory.  Well developed well nourished. Well groomed. Pleasant female. Anxious at times. Becomes tearful when talks about her husband's medical problems. Affect  congruent with mood.  Nursing note and vitals reviewed.    Assessment and Plan   1. Anxiety/Depression Followed by psychiatry. Keep scheduled appointment with Dr.Kaur. Continue medications as directed. - LORazepam (ATIVAN) 0.5 MG tablet; Take 1/2 to 1 tablet by mouth twice a day as needed  Dispense: 60 tablet; Refill: 1 patient counseled concerning the risk of this medication and that it increases her risk of falls. She voiced understanding. Patient counseled in detail regarding the risks of medication. Told to call or return to clinic if develop any worrisome signs or symptoms. Patient voiced understanding.  Suicide risks evaluated and documented in note if present or in the area below.  Patient does not have/denies the following risks: previous suicide attempts, family history of suicide, , history of alcohol or  substance abuse disorder, recent loss of a loved one, or severe hopelessness. Patient has protective factors of family and community support.  Patient reports that family believes is behaving rationally. Patient displays problem solving skills.   Patient specifically denies suicide ideation. Patient has access/information to healthcare contacts if situation or mood changes where patient is a risk to self or others or mood becomes unstable.   During the encounter, the patient had good eye contact and firm handshake regarding safety contract and agreement to seek help if mood worsens and not to harm self.   Patient understands the treatment plan and is in agreement. Agrees to keep follow up and call prior or return to clinic if needed.    2. HTN (hypertension), benign Increase medication as directed. Today patient brought in her blood pressure cuff and it was reading consistent with my reading in the office. - amLODipine (NORVASC) 2.5 MG tablet; Take 1 tablet (2.5 mg total) by mouth 2 (two) times daily.  Dispense: 60 tablet; Refill: 1 Patient told to call with any questions or  concerns or trouble with her blood pressure. Will recheck in 1 month. 3. Hypothyroidism, unspecified type Refill of medication. Patient will try brand only medication for a month and see if she can tell a difference. If she cannot tell a difference will return back to the generic formulation. - SYNTHROID 88 MCG tablet; Take 1 tablet (88 mcg total) by mouth daily before breakfast.  Dispense: 30 tablet; Refill: 2  4. Need for pneumococcal vaccination Patient would like to go get the high-dose flu shot at a pharmacy. - Pneumococcal conjugate vaccine 13-valent IM  Labs reviewed and discussed with patient in detail. She will call with any questions or concerns. Keep scheduled visits with psychiatry. She defers a therapist at this time. Records again requested from Dr. Ilean Skill office. Return in about 6 weeks (around 02/01/2017) for BP. Aliene Beams, MD 12/21/2016

## 2016-12-23 ENCOUNTER — Ambulatory Visit: Payer: Self-pay | Admitting: Family Medicine

## 2016-12-28 DIAGNOSIS — M9903 Segmental and somatic dysfunction of lumbar region: Secondary | ICD-10-CM | POA: Diagnosis not present

## 2016-12-28 DIAGNOSIS — M5442 Lumbago with sciatica, left side: Secondary | ICD-10-CM | POA: Diagnosis not present

## 2016-12-28 DIAGNOSIS — M9905 Segmental and somatic dysfunction of pelvic region: Secondary | ICD-10-CM | POA: Diagnosis not present

## 2016-12-28 DIAGNOSIS — M9902 Segmental and somatic dysfunction of thoracic region: Secondary | ICD-10-CM | POA: Diagnosis not present

## 2017-01-02 DIAGNOSIS — Z23 Encounter for immunization: Secondary | ICD-10-CM | POA: Diagnosis not present

## 2017-01-12 DIAGNOSIS — M9902 Segmental and somatic dysfunction of thoracic region: Secondary | ICD-10-CM | POA: Diagnosis not present

## 2017-01-12 DIAGNOSIS — M9905 Segmental and somatic dysfunction of pelvic region: Secondary | ICD-10-CM | POA: Diagnosis not present

## 2017-01-12 DIAGNOSIS — M9903 Segmental and somatic dysfunction of lumbar region: Secondary | ICD-10-CM | POA: Diagnosis not present

## 2017-01-12 DIAGNOSIS — M5442 Lumbago with sciatica, left side: Secondary | ICD-10-CM | POA: Diagnosis not present

## 2017-01-26 DIAGNOSIS — M9902 Segmental and somatic dysfunction of thoracic region: Secondary | ICD-10-CM | POA: Diagnosis not present

## 2017-01-26 DIAGNOSIS — M9903 Segmental and somatic dysfunction of lumbar region: Secondary | ICD-10-CM | POA: Diagnosis not present

## 2017-01-26 DIAGNOSIS — M9905 Segmental and somatic dysfunction of pelvic region: Secondary | ICD-10-CM | POA: Diagnosis not present

## 2017-01-26 DIAGNOSIS — M5442 Lumbago with sciatica, left side: Secondary | ICD-10-CM | POA: Diagnosis not present

## 2017-01-27 ENCOUNTER — Telehealth: Payer: Self-pay | Admitting: *Deleted

## 2017-01-27 NOTE — Telephone Encounter (Signed)
Patient informed of message below, verbalized understanding.  

## 2017-01-27 NOTE — Telephone Encounter (Signed)
Patient called stating she is calling to give Dr Tracie Harrier her blood pressure readings yesterday 01/26/17 157/86 pulse 102 and this morning 135/69 pulse 88. Patient needs to know if she needs to continue the blood pressure medication

## 2017-01-27 NOTE — Telephone Encounter (Signed)
Patient called when I was on call on 01/25/17. Reported that BP was running high for the past week or so after psychiatrist increased medication for depression.  Was previously on norvasc 2.5 mg po bid. Did not feel good b/c BP was running high. Denied any palpitations, CP, or SOB.  Told to increase norvasc to 2.5 mg, 1 po in the am and 2 po in the pm. Was asked to call and let me know how she was feeling.  Please call patient and tell her that BP seem to be running better. Please continue current dose of amlodipine 2.5 mg, 1  po qam and 2 po qpm. Advise her to check BP and continue to monitor. Advise to bring readings to OV. Advise to please let me know if she is not feeling well or having problems.

## 2017-01-31 ENCOUNTER — Other Ambulatory Visit: Payer: Self-pay

## 2017-01-31 ENCOUNTER — Encounter: Payer: Self-pay | Admitting: Family Medicine

## 2017-01-31 ENCOUNTER — Ambulatory Visit: Payer: Medicare Other | Admitting: Family Medicine

## 2017-01-31 DIAGNOSIS — F419 Anxiety disorder, unspecified: Secondary | ICD-10-CM | POA: Diagnosis not present

## 2017-01-31 DIAGNOSIS — I10 Essential (primary) hypertension: Secondary | ICD-10-CM | POA: Diagnosis not present

## 2017-01-31 DIAGNOSIS — E039 Hypothyroidism, unspecified: Secondary | ICD-10-CM

## 2017-01-31 MED ORDER — LORAZEPAM 0.5 MG PO TABS: ORAL_TABLET | ORAL | 1 refills | Status: DC

## 2017-01-31 MED ORDER — SYNTHROID 88 MCG PO TABS: 88.0000 ug | ORAL_TABLET | Freq: Every day | ORAL | 2 refills | Status: DC

## 2017-01-31 MED ORDER — AMLODIPINE BESYLATE 2.5 MG PO TABS: 2.5000 mg | ORAL_TABLET | Freq: Two times a day (BID) | ORAL | 1 refills | Status: DC

## 2017-01-31 NOTE — Progress Notes (Signed)
Patient ID: Erica [name], female    DOB: [birth year], 83 y.o.   MRN: [id number]  Chief Complaint  Patient presents with  . Hypertension    Allergies Penicillins  Subjective:   Erica [name] is a 83 y.o. female who presents to [facility] today.  HPI Patient presents today for follow-up for blood pressure.  She reports that after she was seen by her psychiatrist her blood pressure went up.  Has been taking the amlodipine 2.5 mg 1 in the morning and 2 in the evening.  Reports that her blood pressure have been much better and was running in the 120s-140s.  However reports that last night she did not take the extra dose of the blood pressure medicine because her blood pressure was running lower.  She reports that her stress is down and she is now feeling fine.  Reports she is concerned that if she takes the 2 Norvasc pills in the evening that it will make her feel dizzy.  She does not want to take them at this time and would rather check her blood pressure and let me know if it is elevated.  Reports that she always gets anxious and nervous when she goes to the doctor.  Reports that it always makes her heart rate get elevated.  Reports that as soon as she gets home her blood pressure would be better controlled and her heart rate would be lower.  Denies any chest pain, palpitations, or shortness of breath.  Reports that she is always hesitant to take medications because they make her dizzy and feel lightheaded.  Reports that her mood has been better and is not having side effects on the higher dose of her Emsam.  Reports that she lost her prescription for her Ativan and would like another one.  Reports that she never got it filled and her current medication is over a year old.  Reports that she only uses a half a tablet about twice a month.  Feels better like it is there if she needs it when she has the medication.  Reports that her life is a little bit less stressful at this time.   Has a caregiver that is helping take care of her husband.  Reports she has been eating well and drinking fluids.  Her arthritis pains are basically stable unless she gets out and does too much yard work.  Bowel movements are normal.  Needs a refill on her thyroid medication.  Reports she did feel better when she was on the brand of Synthroid rather than the generic. Was scheduled to get a complete physical exam today but does not want to get a physical performed.  Reports she does not want a mammogram or bone density testing.  This does not want any type of labs or evaluation performed today.  Reports that she feels well.  Urinating normal.  Appetite good.  Energy level good for her.  Sleeping okay at night.  Reports that her mood is better.  Has family coming into town for Thanksgiving.  Has good support system and friends.    Past Medical History:  Diagnosis Date  . Anxiety   . Depression    bipolar  . Hyperlipidemia   . Irritable bowel syndrome   . RA (rheumatoid arthritis) (HCC)   . Thyroid condition     Past Surgical History:  Procedure Laterality Date  . ABDOMINAL HYSTERECTOMY    . Bone Spurs  08/07/13   Right  Shoulder  . COLONOSCOPY    . COLONOSCOPY N/A 05/08/2012   Procedure: COLONOSCOPY;  Surgeon: Malissa Hippo, MD;  Location: [facility];  Service: Endoscopy;  Laterality: N/A;  730  . COLOSTOMY    . ESOPHAGOGASTRODUODENOSCOPY N/A 11/04/2013   Procedure: ESOPHAGOGASTRODUODENOSCOPY (EGD);  Surgeon: Malissa Hippo, MD;  Location: [facility];  Service: Endoscopy;  Laterality: N/A;  730  . EYE SURGERY  cataract x 2  . MALONEY DILATION N/A 11/04/2013   Procedure: Elease Hashimoto DILATION;  Surgeon: Malissa Hippo, MD;  Location: [facility];  Service: Endoscopy;  Laterality: N/A;  . UPPER GASTROINTESTINAL ENDOSCOPY      Family History  Problem Relation Age of Onset  . Colon cancer Mother   . Bipolar disorder Mother   . Anxiety disorder Mother   . Atrial fibrillation Son     . Depression Maternal Grandmother      Social History   Socioeconomic History  . Marital status: Married    Spouse name: None  . Number of children: None  . Years of education: None  . Highest education level: None  Social Needs  . Financial resource strain: None  . Food insecurity - worry: None  . Food insecurity - inability: None  . Transportation needs - medical: None  . Transportation needs - non-medical: None  Occupational History  . None  Tobacco Use  . Smoking status: Former Smoker    Packs/day: 1.50    Years: 27.00    Pack years: 40.50    Types: Cigarettes    Last attempt to quit: 04/18/1981    Years since quitting: 35.8  . Smokeless tobacco: Former Neurosurgeon    Quit date: 11/27/1980  Substance and Sexual Activity  . Alcohol use: No    Alcohol/week: 0.0 oz  . Drug use: No  . Sexual activity: No    Partners: Male  Other Topics Concern  . None  Social History Narrative  . None    Review of Systems  Constitutional: Negative for fever and unexpected weight change.       Reports feels tired at times but her energy level is stable.  HENT: Positive for hearing loss. Negative for congestion, sneezing, trouble swallowing and voice change.        Reports that she does have hearing loss but does not want any evaluation at this time.  Is seeing an audiologist in the past but does not believe that her hearing is impaired enough that she would want hearing aids.  Respiratory: Negative for cough and shortness of breath.   Cardiovascular: Negative for chest pain and palpitations.  Gastrointestinal: Negative for constipation, diarrhea and nausea.  Genitourinary: Negative for dysuria.  Musculoskeletal: Positive for arthralgias.       Has arthralgias in hands and arms secondary to bone spurs and rheumatoid arthritis.  Reports she does not want any treatment for this and would rather just deal with the occasional pain.  Skin: Negative for rash.  Neurological: Negative for tremors,  weakness, numbness and headaches.       No current dizziness or lightheadedness.  Psychiatric/Behavioral: Negative for dysphoric mood, sleep disturbance and suicidal ideas. The patient is nervous/anxious.        Reports always gets anxious and nervous when has to go to the doctor.  Reports that it is not that she does not like our office is just she gets nervous.  Reports her blood pressure always goes up and heart rate goes up when she  goes to the doctor.  Reports she does get anxious at times but believes she is dealing better with her stress.     Objective:   BP (!) 142/80 (BP Location: Left Arm, Patient Position: Sitting, Cuff Size: Normal)   Pulse 98   Temp 98.9 F (37.2 C) (Other (Comment))   Resp 16   Ht 5\' 2"  (1.575 m)   Wt 119 lb 12 oz (54.3 kg)   SpO2 95%   BMI 21.90 kg/m   Physical Exam  Constitutional: She is oriented to person, place, and time. She appears well-developed and well-nourished.  HENT:  Head: Normocephalic and atraumatic.  Neck: Normal range of motion. Neck supple. No JVD present.  Cardiovascular: Normal rate, regular rhythm and normal heart sounds.  Pulmonary/Chest: Effort normal and breath sounds normal.  Neurological: She is alert and oriented to person, place, and time. No cranial nerve deficit.  Skin: Skin is warm and dry. Capillary refill takes less than 2 seconds.  Psychiatric: She has a normal mood and affect. Her behavior is normal.  Vitals reviewed.    Assessment and Plan  1. Anxiety Long history of anxiety and chronic use of benzodiazepines as needed.  She understands risk and benefits of this medication.  She does not drive at all and understands sedation risk.  Medication was sent electronically to the pharmacy. Patient counseled in detail regarding the risks of medication. Told to call or return to clinic if develop any worrisome signs or symptoms. Patient voiced understanding.   - LORazepam (ATIVAN) 0.5 MG tablet; Take 1/2 to 1 tablet by  mouth twice a day as needed  Dispense: 60 tablet; Refill: 1 Patient will keep scheduled follow-up with her psychiatrist for management of her depression.  Continue relaxation and stress management techniques for anxiety. Suicide risks evaluated and documented in note if present or in the area below.  Patient has protective factors of family and community support.   Patient displays problem solving skills.   Patient specifically denies suicide ideation. Patient has access/information to healthcare contacts if situation or mood changes where patient is a risk to self or others or mood becomes unstable.   During the encounter, the patient had good eye contact and firm handshake regarding safety contract and agreement to seek help if mood worsens and not to harm self.   Patient understands the treatment plan and is in agreement. Agrees to keep follow up and call prior or return to clinic if needed.   2. HTN (hypertension), benign Patient does not want to continue the dosing which was changed last week due to her elevated blood pressures.  She reports her blood pressures come down she is not feeling stressed.  She will continue to monitor her blood pressure and this is what she feels would be the best treatment for her.  She is concerned of side effects if her blood pressure gets lower than 120s.  She will call or return to clinic if she is not feeling well or has problems.  She will continue the Norvasc twice a day as directed. - amLODipine (NORVASC) 2.5 MG tablet; Take 1 tablet (2.5 mg total) by mouth 2 (two) times daily.  Dispense: 60 tablet; Refill: 1  3. Hypothyroidism, unspecified type Refill sent on for brand only medication. - SYNTHROID 88 MCG tablet; Take 1 tablet (88 mcg total) by mouth daily before breakfast.  Dispense: 30 tablet; Refill: 2  Patient defers any preventive health screenings.  Vaccinations up-to-date. Follow-up with psychiatry as directed.  No Follow-up on file. Aliene Beams, MD 01/31/2017

## 2017-02-01 ENCOUNTER — Encounter: Payer: Self-pay | Admitting: Family Medicine

## 2017-02-27 DIAGNOSIS — M9903 Segmental and somatic dysfunction of lumbar region: Secondary | ICD-10-CM | POA: Diagnosis not present

## 2017-02-27 DIAGNOSIS — M9905 Segmental and somatic dysfunction of pelvic region: Secondary | ICD-10-CM | POA: Diagnosis not present

## 2017-02-27 DIAGNOSIS — M9902 Segmental and somatic dysfunction of thoracic region: Secondary | ICD-10-CM | POA: Diagnosis not present

## 2017-02-27 DIAGNOSIS — M5442 Lumbago with sciatica, left side: Secondary | ICD-10-CM | POA: Diagnosis not present

## 2017-03-02 ENCOUNTER — Other Ambulatory Visit: Payer: Self-pay | Admitting: Family Medicine

## 2017-03-02 DIAGNOSIS — I10 Essential (primary) hypertension: Secondary | ICD-10-CM

## 2017-03-03 DIAGNOSIS — M9903 Segmental and somatic dysfunction of lumbar region: Secondary | ICD-10-CM | POA: Diagnosis not present

## 2017-03-03 DIAGNOSIS — M9902 Segmental and somatic dysfunction of thoracic region: Secondary | ICD-10-CM | POA: Diagnosis not present

## 2017-03-03 DIAGNOSIS — M5442 Lumbago with sciatica, left side: Secondary | ICD-10-CM | POA: Diagnosis not present

## 2017-03-03 DIAGNOSIS — M9905 Segmental and somatic dysfunction of pelvic region: Secondary | ICD-10-CM | POA: Diagnosis not present

## 2017-03-21 ENCOUNTER — Ambulatory Visit (INDEPENDENT_AMBULATORY_CARE_PROVIDER_SITE_OTHER): Payer: Medicare Other | Admitting: Family Medicine

## 2017-03-21 ENCOUNTER — Other Ambulatory Visit: Payer: Self-pay

## 2017-03-21 ENCOUNTER — Encounter: Payer: Self-pay | Admitting: Family Medicine

## 2017-03-21 VITALS — BP 132/78 | HR 96 | Temp 98.5°F | Resp 16 | Ht 62.0 in | Wt 120.8 lb

## 2017-03-21 DIAGNOSIS — R0600 Dyspnea, unspecified: Secondary | ICD-10-CM

## 2017-03-21 DIAGNOSIS — F419 Anxiety disorder, unspecified: Secondary | ICD-10-CM

## 2017-03-21 DIAGNOSIS — F32A Depression, unspecified: Secondary | ICD-10-CM

## 2017-03-21 DIAGNOSIS — F329 Major depressive disorder, single episode, unspecified: Secondary | ICD-10-CM | POA: Diagnosis not present

## 2017-03-21 DIAGNOSIS — I10 Essential (primary) hypertension: Secondary | ICD-10-CM | POA: Diagnosis not present

## 2017-03-21 NOTE — Progress Notes (Signed)
Patient ID: Erica [name], female    DOB: 01/23/1933, 84 y.o.   MRN: [id number]  Chief Complaint  Patient presents with  . Follow-up  . Hypertension  . Shortness of Breath    does not wake up with it.   . Fatigue    Allergies Penicillins  Subjective:   Erica [name] is a 84 y.o. female who presents to [facility] today.  HPI [name] presents today for a follow-up visit.  She reports that she recently had an in-home visit by a geriatric physician associated with [facility].  She reports that [employer] hires [facility] to see and evaluate their patients.  She reports that, Dr. Rikki Spearing, geriatrician with [facility], came to house and stayed for two hours.  She reports that he mainly saw her husband but also did talk with her regarding her medications.  She reports that he will be coming back to house every 4 weeks, to visit and talk with her and family. Reports that the doctor got upset with her b/c of her taking the protonix. Has been on it a long time for GERD per her gastroenterologist. Reports that he was not happy with her taking the emsam and wanted a list of all the medications that she is been on for her depression.  She reports that she did tell him that she had tried all options for depression and that this was the only medication that worked for her.  She reports he did not want to take her off the medication.  She reports that he would like for her to have cognitive behavioral therapy via computer at home.  In addition, she tells me that  that he was not happy with her BP. He wanted her to take norvasc 2.5 mg, 2 po qam and 1 po qhs.   Feels that in the mornings that she feels winded and lightheaded.  Denies any chest pain.  No swelling in her legs.  Reports that she has had issues in the past where she has felt short of breath.  She had a chest x-ray in April 2017 which was normal.  At that time she was sent to pulmonology for an evaluation and was told her  breathing was fine.  She reports that she is not very physically active due to her rheumatoid arthritis and therefore now when she is active she believes it makes her a little bit winded.  She has no family history of heart disease.  Does not smoke.  Has never had a heart attack or stroke.  Reports that her husband is seen by Dr. Tonny Bollman in cardiology.  Reports she has never had a stress test or evaluation of her heart.  Reports that her heart has always beat very fast and told she is usually tachycardic.  Is on thyroid medication but doses have been adjusted and TSH followed.  Reports that her heart rate and blood pressure do tend to go up when she gets anxious. Reports that she has dealt with anxiety and depression her whole life.  Is on Emsam which she reports can cause problems with blood pressure.  She reports that she does get anxious and stressed at different times throughout the day.  Tends to use Ativan as needed for stress.  Reports it does not make her sleepy or cause any issues.  No falls.  Has not had any episodes of chest pain or waking up from sleep short of breath.  Denies  any palpitations.  Reports that this problem is been going on and off for a long time.    Past Medical History:  Diagnosis Date  . Anxiety   . Depression    bipolar  . Hyperlipidemia   . Irritable bowel syndrome   . RA (rheumatoid arthritis) (HCC)   . Thyroid condition     Past Surgical History:  Procedure Laterality Date  . ABDOMINAL HYSTERECTOMY    . Bone Spurs  08/07/13   Right Shoulder  . COLONOSCOPY    . COLONOSCOPY N/A 05/08/2012   Procedure: COLONOSCOPY;  Surgeon: Malissa Hippo, MD;  [facility];  Service: Endoscopy;  Laterality: N/A;  730  . COLOSTOMY    . ESOPHAGOGASTRODUODENOSCOPY N/A 11/04/2013   Procedure: ESOPHAGOGASTRODUODENOSCOPY (EGD);  Surgeon: Malissa Hippo, MD;  [facility];  Service: Endoscopy;  Laterality: N/A;  730  . EYE SURGERY  cataract x 2  .  MALONEY DILATION N/A 11/04/2013   Procedure: Elease Hashimoto DILATION;  Surgeon: Malissa Hippo, MD;  Location: [facility];  Service: Endoscopy;  Laterality: N/A;  . UPPER GASTROINTESTINAL ENDOSCOPY      Family History  Problem Relation Age of Onset  . Colon cancer Mother   . Bipolar disorder Mother   . Anxiety disorder Mother   . Atrial fibrillation Son   . Depression Maternal Grandmother      Social History   Socioeconomic History  . Marital status: Married    Spouse name: None  . Number of children: None  . Years of education: None  . Highest education level: None  Social Needs  . Financial resource strain: None  . Food insecurity - worry: None  . Food insecurity - inability: None  . Transportation needs - medical: None  . Transportation needs - non-medical: None  Occupational History  . None  Tobacco Use  . Smoking status: Former Smoker    Packs/day: 1.50    Years: 27.00    Pack years: 40.50    Types: Cigarettes    Last attempt to quit: 04/18/1981    Years since quitting: 35.9  . Smokeless tobacco: Former Neurosurgeon    Quit date: 11/27/1980  Substance and Sexual Activity  . Alcohol use: No    Alcohol/week: 0.0 oz  . Drug use: No  . Sexual activity: No    Partners: Male  Other Topics Concern  . None  Social History Narrative  . None   Current Outpatient Medications on File Prior to Visit  Medication Sig Dispense Refill  . acetaminophen (TYLENOL) 650 MG CR tablet Take 1,300 mg by mouth daily.    Marland Kitchen amLODipine (NORVASC) 2.5 MG tablet TAKE 1 TABLET BY MOUTH TWICE DAILY. 60 tablet 0  . Cyanocobalamin (VITAMIN B-12 PO) Take by mouth daily.    . folic acid (FOLVITE) 1 MG tablet Take 1 mg by mouth at bedtime.     . gabapentin (NEURONTIN) 100 MG capsule Take 100 mg by mouth 3 (three) times daily.   0  . LORazepam (ATIVAN) 0.5 MG tablet Take 1/2 to 1 tablet by mouth twice a day as needed 60 tablet 1  . pantoprazole (PROTONIX) 40 MG tablet TAKE ONE TABLET BY MOUTH DAILY BEFORE  BREAKFAST. 30 tablet 5  . selegiline (EMSAM) 6 MG/24HR Place 1 patch (6 mg total) onto the skin daily. 30 patch 12  . SYNTHROID 88 MCG tablet Take 1 tablet (88 mcg total) by mouth daily before breakfast. 30 tablet 2  No current facility-administered medications on file prior to visit.     Review of Systems   Objective:   BP 132/78 (BP Location: Left Arm, Patient Position: Sitting, Cuff Size: Normal)   Pulse 96   Temp 98.5 F (36.9 C) (Other (Comment))   Resp 16   Ht 5\' 2"  (1.575 m)   Wt 120 lb 12 oz (54.8 kg)   SpO2 96%   BMI 22.09 kg/m  Initially blood pressure reading was 140/92.  After talking with patient for over 30 minutes her blood pressure was rechecked and came down to the reading of 132/78 and heart rate of 92. Physical Exam  Constitutional: She appears well-developed and well-nourished.  HENT:  Head: Normocephalic and atraumatic.  Eyes: EOM are normal. Pupils are equal, round, and reactive to light.  Cardiovascular: Normal rate, regular rhythm and normal heart sounds.  Pulmonary/Chest: Effort normal and breath sounds normal. No respiratory distress. She has no decreased breath sounds. She has no wheezes. She has no rhonchi. She has no rales.  Skin: Skin is warm and dry.  Psychiatric: Her behavior is normal. Her mood appears anxious. She is not agitated.  Initially patient was anxious and rapid rate of speech.  However patient's mood calmed and she reportedly felt much better after our discussion.  Vitals reviewed.   EKG performed in clinic revealing sinus tachycardia with a ventricular rate of 109 with some PACs.  No ST segment abnormalities or T wave inversion present.  No Q waves present on EKG. Assessment and Plan  1. Dyspnea, unspecified type Patient with chronic symptoms of dyspnea with questionable acute worsening.  Questionable as to whether symptoms are related to deconditioning versus anxiety versus cardiac etiology.  Patient reports symptoms do not occur  simultaneously with anxiety.  Will send for chest x-ray at this time.  EKG performed in clinic today. - Ambulatory referral to Cardiology - DG Chest 2 View; Future Patient's family has been seen by Dr. Excell Seltzer for some time.  She would like to be seen and evaluated by him and I am in agreement with this.  She prefers not to get an echo or other studies done prior to visit with cardiology. 2.  Hypertension At this time believe that patient's blood pressure is fairly well controlled.  Readings that she brought in from home range from 110-150/70 to 80s.  Patient does report that when she gets anxious her blood pressure does tend to go up.  She has a history of depression worsened by beta blockers and is not willing to try beta-blockers at this time.  She reports she is very sensitive to medications and when her doses are increased she tends to get dizzy and feel worse.  She reports now that she knows that her blood pressures are not to the level of her having a stroke she believes that she will not be as anxious and her blood pressures will be better controlled.  She reports she would like to continue checking her blood pressure and follow-up in 2 weeks.  When she comes back we will check her TSH.  3.  Anxiety New stress management and relaxation exercises.  I do believe that if patient is agreeable to trying cognitive behavioral therapy course online that that would be fine as long as it does not increase her anxiety and stress her out regarding more things for her to complete.  She does have a lot on her plate now that her husband is suffering from Alzheimer's and she  is handling most of the finances and household dealings.  Did discuss that she can continue her use of Ativan as needed.  She understands the fall risk and sedation risk associated with this medication.  She reports she has taken this for a long time and knows the risks.  She will keep her scheduled follow-ups with her psychiatrist and continue  her Emsam as directed. No Follow-up on file. Aliene Beams, MD 03/21/2017

## 2017-03-30 ENCOUNTER — Telehealth: Payer: Self-pay | Admitting: Family Medicine

## 2017-03-30 NOTE — Telephone Encounter (Signed)
Patient called in and stated she is on Blood Pressure  medication but her blood pressure was 120/67, her pulse was 111this morning  yesterday it was 115/77  Cb#: [phone]

## 2017-03-31 NOTE — Telephone Encounter (Signed)
Please advise patient to continue her medications as directed. Her blood pressure readings are good. Please advise her to keep her follow up. Her pulse is still elevated, but I know that she has had this for a long time. Tell her that her BP is good. I hope she is feeling well. Janine Limbo. Tracie Harrier, MD

## 2017-03-31 NOTE — Telephone Encounter (Signed)
Patient informed of message below, verbalized understanding. She states she will be at follow-up. Wants Dr. Tracie Harrier to know she is a little winded, but otherwise okay.

## 2017-04-05 ENCOUNTER — Encounter: Payer: Self-pay | Admitting: Family Medicine

## 2017-04-05 ENCOUNTER — Ambulatory Visit (INDEPENDENT_AMBULATORY_CARE_PROVIDER_SITE_OTHER): Payer: Medicare Other | Admitting: Family Medicine

## 2017-04-05 ENCOUNTER — Other Ambulatory Visit: Payer: Self-pay

## 2017-04-05 VITALS — BP 120/80 | HR 110 | Temp 97.7°F | Resp 16 | Ht 62.0 in | Wt 122.0 lb

## 2017-04-05 DIAGNOSIS — F419 Anxiety disorder, unspecified: Secondary | ICD-10-CM | POA: Diagnosis not present

## 2017-04-05 DIAGNOSIS — I10 Essential (primary) hypertension: Secondary | ICD-10-CM | POA: Diagnosis not present

## 2017-04-05 DIAGNOSIS — F339 Major depressive disorder, recurrent, unspecified: Secondary | ICD-10-CM | POA: Diagnosis not present

## 2017-04-05 DIAGNOSIS — R0609 Other forms of dyspnea: Secondary | ICD-10-CM | POA: Diagnosis not present

## 2017-04-05 DIAGNOSIS — E039 Hypothyroidism, unspecified: Secondary | ICD-10-CM | POA: Diagnosis not present

## 2017-04-05 DIAGNOSIS — R0600 Dyspnea, unspecified: Secondary | ICD-10-CM

## 2017-04-05 MED ORDER — AMLODIPINE BESYLATE 2.5 MG PO TABS: 2.5000 mg | ORAL_TABLET | Freq: Two times a day (BID) | ORAL | 6 refills | Status: DC

## 2017-04-05 NOTE — Progress Notes (Signed)
Patient ID: Erica [name], female    DOB: 05-30-1932, 84 y.o.   MRN: [id number]  Chief Complaint  Patient presents with  . Follow-up    Allergies Penicillins  Subjective:   Erica [name] is a 84 y.o. female who presents to [facility] today.  HPI Here for follow-up office visit.  Reports that her blood pressure is been running well.  However she does report that when she was taking Norvasc 2.5 mg, 2 pills in the morning and 1 pill in the evening, that her blood pressure would get too low.  She reports that she decreased her medication to where she is now taking 1 pill twice a day.  She reports that she has more energy than she did several weeks ago.  She does not feel quite as winded with exertion that she did several weeks ago.  Reports that she still does feel and tired in the morning but in the afternoon feels much better.  She reports that she did not get the chest x-ray performed yet.  She reports that she did not receive a call from cardiology regarding the referral to see Dr Tonny Bollman.  She reports she would actually like to see Dr. Nona Dell because she has heard good reports about him.  Brings in readings for her blood pressure which reveal a blood pressure in the range of 118-150 /70-86.  Heart rate ranges from 70s-110.  She reports that she has been taking the branded thyroid for the past several months.  Believes that the medication is working better because she feels like her hair is growing again.  No palpitations.  Denies any chest pain or swelling in her extremities.   Still being followed by psychiatry for depression and anxiety.  Using the Emsam patches as directed.  Does still take the lorazepam but does not exceed 1/2 pill in 24 hours.  Sleeps well at night.  Feels like her medication is working for her mood.  Has a follow-up with psychiatry in February 2019.  Denies any flares of her depression.  Denies any suicidal or homicidal lesions.  Does  lots of arts and crafts and listens to relaxation music to help deal with stress.  Does have a lot to deal with at home secondary to her husband's dementia.  Reports that she is not having joint pain at this time secondary to her rheumatoid arthritis.  Tries to stay active but does have limited use of her hands due to arthritic changes.  Uses a Tylenol sporadically for pain control.       Past Medical History:  Diagnosis Date  . Anxiety   . Depression    bipolar  . Hyperlipidemia   . Irritable bowel syndrome   . RA (rheumatoid arthritis) (HCC)   . Thyroid condition     Past Surgical History:  Procedure Laterality Date  . ABDOMINAL HYSTERECTOMY    . Bone Spurs  08/07/13   Right Shoulder  . COLONOSCOPY    . COLONOSCOPY N/A 05/08/2012   Procedure: COLONOSCOPY;  Surgeon: Malissa Hippo, MD;  [facility];  Service: Endoscopy;  Laterality: N/A;  730  . COLOSTOMY    . ESOPHAGOGASTRODUODENOSCOPY N/A 11/04/2013   Procedure: ESOPHAGOGASTRODUODENOSCOPY (EGD);  Surgeon: Malissa Hippo, MD;  [facility];  Service: Endoscopy;  Laterality: N/A;  730  . EYE SURGERY  cataract x 2  . MALONEY DILATION N/A 11/04/2013   Procedure: MALONEY DILATION;  Surgeon: Ok Anis  Cline Crock, MD;  [facility]: [facility];  Service: Endoscopy;  Laterality: N/A;  . UPPER GASTROINTESTINAL ENDOSCOPY      Family History  Problem Relation Age of Onset  . Colon cancer Mother   . Bipolar disorder Mother   . Anxiety disorder Mother   . Atrial fibrillation Son   . Depression Maternal Grandmother      Social History   Socioeconomic History  . Marital status: Married    Spouse name: None  . Number of children: None  . Years of education: None  . Highest education level: None  Social Needs  . Financial resource strain: None  . Food insecurity - worry: None  . Food insecurity - inability: None  . Transportation needs - medical: None  . Transportation needs - non-medical: None    Occupational History  . None  Tobacco Use  . Smoking status: Former Smoker    Packs/day: 1.50    Years: 27.00    Pack years: 40.50    Types: Cigarettes    Last attempt to quit: 04/18/1981    Years since quitting: 35.9  . Smokeless tobacco: Former Neurosurgeon    Quit date: 11/27/1980  Substance and Sexual Activity  . Alcohol use: No    Alcohol/week: 0.0 oz  . Drug use: No  . Sexual activity: No    Partners: Male  Other Topics Concern  . None  Social History Narrative  . None    Review of Systems  Constitutional: Negative for appetite change, chills, fever and unexpected weight change.  Respiratory: Negative for apnea, cough, choking, chest tightness and wheezing.   Cardiovascular: Negative for chest pain, palpitations and leg swelling.  Gastrointestinal: Negative for abdominal pain.  Musculoskeletal: Negative for myalgias.  Neurological: Negative for dizziness, facial asymmetry, weakness and numbness.  Hematological: Negative for adenopathy. Does not bruise/bleed easily.     Objective:   BP 120/80 (BP Location: Left Arm, Patient Position: Sitting, Cuff Size: Normal)   Pulse (!) 110   Temp 97.7 F (36.5 C) (Other (Comment))   Resp 16   Ht 5\' 2"  (1.575 m)   Wt 122 lb (55.3 kg)   SpO2 100%   BMI 22.31 kg/m   Physical Exam  Constitutional: She is oriented to person, place, and time. She appears well-developed and well-nourished.  HENT:  Head: Normocephalic and atraumatic.  Eyes: EOM are normal. Pupils are equal, round, and reactive to light.  Cardiovascular: Normal rate, regular rhythm and normal heart sounds.  Pulmonary/Chest: Effort normal and breath sounds normal.  Neurological: She is alert and oriented to person, place, and time. No cranial nerve deficit.  Skin: Skin is warm and dry.  Psychiatric: She has a normal mood and affect. Her behavior is normal. Judgment and thought content normal.  Vitals reviewed.    Assessment and Plan  1. Hypothyroidism, unspecified  type Check for stability after switching to brand of thyroid medication. - TSH + free T4 - T3  2. HTN (hypertension), benign Improved and stable. - amLODipine (NORVASC) 2.5 MG tablet; Take 1 tablet (2.5 mg total) by mouth 2 (two) times daily.  Dispense: 60 tablet; Refill: 6 - 3.  Anxiety and Depression Continue follow-up with psychiatry as directed.  Patient was told that if she develops any changes in mood or any problems to please contact her psychiatrist or call our office.  She voiced understanding.  4.  Dyspnea on exertion Referral for cardiology was placed again today.  Patient wishes to be seen by  Dr. Simona Huh.  She was given an appointment for April 26, 2017 at 120.  She would like to see cardiology before undergoing any procedure/echo. Return in about 4 weeks (around 05/03/2017) for follow up. Aliene Beams, MD 04/05/2017

## 2017-04-06 ENCOUNTER — Encounter: Payer: Self-pay | Admitting: Family Medicine

## 2017-04-06 ENCOUNTER — Telehealth: Payer: Self-pay | Admitting: Family Medicine

## 2017-04-06 LAB — T3: T3 TOTAL: 94 ng/dL (ref 76–181)

## 2017-04-06 LAB — TSH+FREE T4: TSH W/REFLEX TO FT4: 0.32 m[IU]/L — AB (ref 0.40–4.50)

## 2017-04-06 LAB — T4, FREE: Free T4: 1.7 ng/dL (ref 0.8–1.8)

## 2017-04-06 MED ORDER — LEVOTHYROXINE SODIUM 75 MCG PO TABS: 75.0000 ug | ORAL_TABLET | Freq: Every day | ORAL | 2 refills | Status: DC

## 2017-04-06 NOTE — Telephone Encounter (Signed)
Patient informed of message below, verbalized understanding.  

## 2017-04-06 NOTE — Telephone Encounter (Signed)
Please call patient and advised that her thyroid function is a little bit overactive.  She is currently on Synthroid 88 mcg a day.  At this time I would like to decrease her levels to 75 mcg a day.  She will continue with the brand only medication.  I have sent this prescription into [place].  We will need to recheck her levels in approximately 3 months.  Advised her that I will let her know once I receive the chest x-ray results.  I hope she is feeling well.  Advised her that I have sent a copy of her test results in the mail.

## 2017-04-07 ENCOUNTER — Ambulatory Visit (HOSPITAL_COMMUNITY)
Admission: RE | Admit: 2017-04-07 | Discharge: 2017-04-07 | Disposition: A | Discharge status: Home / Self Care | Payer: Medicare Other | Source: Ambulatory Visit | Attending: Family Medicine | Admitting: Family Medicine

## 2017-04-07 DIAGNOSIS — R0600 Dyspnea, unspecified: Secondary | ICD-10-CM | POA: Diagnosis present

## 2017-04-07 DIAGNOSIS — R0602 Shortness of breath: Secondary | ICD-10-CM | POA: Diagnosis not present

## 2017-04-24 ENCOUNTER — Encounter: Payer: Self-pay | Admitting: Cardiology

## 2017-04-24 NOTE — Progress Notes (Signed)
Cardiology Office Note  Date: 04/26/2017   ID: Erica [name], DOB Jul 24, 1932, MRN [id number]  PCP: Erica Beams, MD  Consulting Cardiologist: Erica Dell, MD   Chief Complaint  Patient presents with  . Shortness of Breath    History of Present Illness: Erica [name] is an 84 y.o. female referred for cardiology consultation by Erica [name] for evaluation of shortness of breath.  She is here with her daughter-in-law.  We went over her medical history and medications.  She has a history of bipolar disorder with anxiety and depression, states that her current regimen has been fairly effective from a psychiatric perspective.  She has a history of intermittent elevations in blood pressure is well, reportedly this was an issue when she was undergoing ECT in the past.  More recently she has been placed on low-dose amlodipine due to elevated blood pressure.  We went over home blood pressure and heart rate checks with systolics generally ranging from 120-150.  Of note, she also states that her "heart rate runs fast" and that this has been a problem for several years.  She has no history of cardiac arrhythmia.  She tells me that she does suffer with anxiety and feels like shortness of breath she experiences is related to this.  She does not specifically endorse any progressive functional limitations, no orthopnea or PND, no leg edema.  Concurrently, her thyroid regimen has been adjusted based on recent lab work detailed below.  She has not undergone an echocardiogram for cardiac structural assessment.  I did review her recent ECG.  Past Medical History:  Diagnosis Date  . Anxiety   . Bipolar disorder (HCC)   . Hyperlipidemia   . Hypothyroidism   . Irritable bowel syndrome   . RA (rheumatoid arthritis) (HCC)     Past Surgical History:  Procedure Laterality Date  . ABDOMINAL HYSTERECTOMY    . Bone Spurs  08/07/13   Right Shoulder  . COLONOSCOPY    . COLONOSCOPY N/A 05/08/2012   Procedure: COLONOSCOPY;  Surgeon: Erica Hippo, MD;  [facility];  Service: Endoscopy;  Laterality: N/A;  730  . COLOSTOMY    . ESOPHAGOGASTRODUODENOSCOPY N/A 11/04/2013   Procedure: ESOPHAGOGASTRODUODENOSCOPY (EGD);  Surgeon: Erica Hippo, MD;  [facility];  Service: Endoscopy;  Laterality: N/A;  730  . EYE SURGERY  cataract x 2  . MALONEY DILATION N/A 11/04/2013   Procedure: Erica [name] DILATION;  Surgeon: Erica Hippo, MD;  [facility];  Service: Endoscopy;  Laterality: N/A;  . UPPER GASTROINTESTINAL ENDOSCOPY      Current Outpatient Medications  Medication Sig Dispense Refill  . acetaminophen (TYLENOL) 650 MG CR tablet Take 1,300 mg by mouth daily.    Marland Kitchen amLODipine (NORVASC) 2.5 MG tablet Take 1 tablet (2.5 mg total) by mouth 2 (two) times daily. 60 tablet 6  . Cyanocobalamin (VITAMIN B-12 PO) Take by mouth daily.    Marland Kitchen levothyroxine (SYNTHROID, LEVOTHROID) 75 MCG tablet Take 1 tablet (75 mcg total) by mouth daily. Please dispense brand only thyroid medication/Synthroid. 30 tablet 2  . LORazepam (ATIVAN) 0.5 MG tablet Take 1/2 to 1 tablet by mouth twice a day as needed 60 tablet 1  . NON FORMULARY CBD OIL - 3 DROPS TOPICAL    . pantoprazole (PROTONIX) 40 MG tablet TAKE ONE TABLET BY MOUTH DAILY BEFORE BREAKFAST. 30 tablet 5  . selegiline (Erica [name]) 6 MG/24HR Place 1 patch (6 mg total) onto the skin daily.  30 patch 12   No current facility-administered medications for this visit.    Allergies:  Penicillins   Social History: The patient  reports that she quit smoking about 36 years ago. Her smoking use included cigarettes. She has a 40.50 pack-year smoking history. She quit smokeless tobacco use about 36 years ago. She reports that she does not drink alcohol or use drugs.   Family History: The patient's family history includes Anxiety disorder in her mother; Atrial fibrillation in her son; Bipolar disorder in her mother; Colon cancer in her mother;  Depression in her maternal grandmother.   ROS:  Please see the history of present illness. Otherwise, complete review of systems is positive for anxiety.  All other systems are reviewed and negative.   Physical Exam: VS:  BP 102/70   Pulse (!) 112   Ht 5' 2.5" (1.588 m)   Wt 116 lb (52.6 kg)   SpO2 97%   BMI 20.88 kg/m , BMI Body mass index is 20.88 kg/m.  Wt Readings from Last 3 Encounters:  04/26/17 116 lb (52.6 kg)  04/05/17 122 lb (55.3 kg)  03/21/17 120 lb 12 oz (54.8 kg)    General: Thin elderly woman, appears comfortable at rest. HEENT: Conjunctiva and lids normal, oropharynx clear. Neck: Supple, no elevated JVP or carotid bruits, no thyromegaly. Lungs: Clear to auscultation, nonlabored breathing at rest. Cardiac: Regular rate and rhythm, S4, soft systolic murmur, no pericardial rub. Abdomen: Soft, nontender, bowel sounds present. Extremities: No pitting edema, distal pulses 2+. Skin: Warm and dry. Musculoskeletal: No kyphosis. Neuropsychiatric: Alert and oriented x3, affect grossly appropriate.  ECG: I personally reviewed the tracing from 03/21/2017 which showed sinus tachycardia with possible left atrial enlargement.  Recent Labwork: 11/28/2016: BUN 11; Creat 0.63; Platelets 227; Potassium 4.0; Sodium 141; TSH 1.20 March 2017: TSH 0.32, T3 94, free T4 1.7  Other Studies Reviewed Today:  Chest x-ray 04/07/2017: FINDINGS: The heart size and mediastinal contours are within normal limits. Both lungs are clear. The visualized skeletal structures are unremarkable.  IMPRESSION: No active cardiopulmonary disease.  Assessment and Plan:  1.  Intermittent shortness of breath, possibly multifactorial.  We will obtain an echocardiogram for basic cardiac structural assessment, mainly to exclude cardiomyopathy.  Recent chest x-ray was unremarkable.  2.  Elevated blood pressure, possibly essential hypertension, although could also be related to Erica [name]. Agree with institution  of Norvasc for now.  We will continue to track blood pressure and follow-up with Erica [name].  3.  Related resting heart rate with sinus rhythm by ECG.  States that her heart rate has been elevated for quite some time, no specific history of arrhythmia.  Echocardiogram being obtained to exclude cardiomyopathy.  4.  Hypothyroidism, on Synthroid.  Dose was down adjusted based on recent low TSH.  Current medicines were reviewed with the patient today.   Orders Placed This Encounter  Procedures  . ECHOCARDIOGRAM COMPLETE    Disposition: Call with test results.  Signed, Jonelle Sidle, MD, [facility] 04/26/2017 1:58 PM    [facility] [address] [place], [place], [place] [address] Phone: [phone]; Fax: [phone]

## 2017-04-26 ENCOUNTER — Ambulatory Visit: Payer: Medicare Other | Admitting: Cardiology

## 2017-04-26 ENCOUNTER — Encounter: Payer: Self-pay | Admitting: Cardiology

## 2017-04-26 VITALS — BP 102/70 | HR 112 | Ht 62.5 in | Wt 116.0 lb

## 2017-04-26 DIAGNOSIS — R0602 Shortness of breath: Secondary | ICD-10-CM

## 2017-04-26 DIAGNOSIS — E039 Hypothyroidism, unspecified: Secondary | ICD-10-CM

## 2017-04-26 DIAGNOSIS — I10 Essential (primary) hypertension: Secondary | ICD-10-CM | POA: Diagnosis not present

## 2017-04-26 DIAGNOSIS — R000 Tachycardia, unspecified: Secondary | ICD-10-CM

## 2017-04-26 NOTE — Patient Instructions (Signed)

## 2017-05-01 ENCOUNTER — Ambulatory Visit (HOSPITAL_BASED_OUTPATIENT_CLINIC_OR_DEPARTMENT_OTHER)
Admission: RE | Admit: 2017-05-01 | Discharge: 2017-05-01 | Disposition: A | Discharge status: Home / Self Care | Payer: Medicare Other | Source: Ambulatory Visit | Attending: Cardiology | Admitting: Cardiology

## 2017-05-01 DIAGNOSIS — R0602 Shortness of breath: Secondary | ICD-10-CM | POA: Diagnosis not present

## 2017-05-01 DIAGNOSIS — I10 Essential (primary) hypertension: Secondary | ICD-10-CM

## 2017-05-01 LAB — ECHOCARDIOGRAM COMPLETE
E/e' ratio: 8.24
EWDT: 155 ms
FS: 45 % — AB (ref 28–44)
IVS/LV PW RATIO, ED: 1.07
LA vol index: 10 mL/m2
LADIAMINDEX: 1.97 cm/m2
LASIZE: 30 mm
LAVOL: 15.2 mL
LAVOLA4C: 14.9 mL
LEFT ATRIUM END SYS DIAM: 30 mm
LV E/e' medial: 8.24
LV PW d: 11 mm — AB (ref 0.6–1.1)
LV TDI E'LATERAL: 10.1
LV e' LATERAL: 10.1 cm/s
LVEEAVG: 8.24
MV Dec: 155
MV pk A vel: 71 m/s
MV pk E vel: 83.2 m/s
MVPG: 3 mmHg
RV TAPSE: 15.4 mm
TDI e' medial: 9.25

## 2017-05-01 NOTE — Progress Notes (Signed)
*  PRELIMINARY RESULTS* Echocardiogram 2D Echocardiogram has been performed.  Erica [name] 05/01/2017, 2:11 PM

## 2017-05-02 ENCOUNTER — Telehealth: Payer: Self-pay

## 2017-05-02 NOTE — Telephone Encounter (Signed)
-----   Message from Norva Pavlov, LPN sent at 2/58/5277  9:37 AM EST -----   ----- Message ----- From: Jonelle Sidle, MD Sent: 05/02/2017   9:03 AM To: Eustace Moore, LPN, Aliene Beams, MD  Results reviewed. Please let her know that the echocardiogram showed normal LVEF at 60-65%, no major valvular abnormalities. Do not anticipate further cardiac testing now unless symptoms progress. A copy of this test should be forwarded to Aliene Beams, MD.

## 2017-05-02 NOTE — Telephone Encounter (Signed)
Patient notified. Routed to PCP 

## 2017-05-03 ENCOUNTER — Emergency Department (HOSPITAL_COMMUNITY): Payer: Medicare Other

## 2017-05-03 ENCOUNTER — Other Ambulatory Visit: Payer: Self-pay

## 2017-05-03 ENCOUNTER — Ambulatory Visit: Payer: Self-pay | Admitting: Family Medicine

## 2017-05-03 ENCOUNTER — Encounter: Payer: Self-pay | Admitting: Family Medicine

## 2017-05-03 ENCOUNTER — Inpatient Hospital Stay (HOSPITAL_COMMUNITY)
Admission: EM | Admit: 2017-05-03 | Discharge: 2017-05-09 | DRG: 389 | Disposition: A | Discharge status: Home Health | Payer: Medicare Other | Attending: Family Medicine | Admitting: Family Medicine

## 2017-05-03 ENCOUNTER — Ambulatory Visit (INDEPENDENT_AMBULATORY_CARE_PROVIDER_SITE_OTHER): Payer: Medicare Other | Admitting: Family Medicine

## 2017-05-03 ENCOUNTER — Inpatient Hospital Stay (HOSPITAL_COMMUNITY): Payer: Medicare Other

## 2017-05-03 ENCOUNTER — Encounter (HOSPITAL_COMMUNITY): Payer: Self-pay | Admitting: Cardiology

## 2017-05-03 VITALS — BP 140/98 | HR 82 | Temp 98.4°F | Resp 16 | Ht 63.0 in | Wt 121.0 lb

## 2017-05-03 DIAGNOSIS — K209 Esophagitis, unspecified without bleeding: Secondary | ICD-10-CM

## 2017-05-03 DIAGNOSIS — J9811 Atelectasis: Secondary | ICD-10-CM | POA: Diagnosis not present

## 2017-05-03 DIAGNOSIS — Z880 Allergy status to penicillin: Secondary | ICD-10-CM | POA: Diagnosis not present

## 2017-05-03 DIAGNOSIS — E871 Hypo-osmolality and hyponatremia: Secondary | ICD-10-CM | POA: Diagnosis not present

## 2017-05-03 DIAGNOSIS — E860 Dehydration: Secondary | ICD-10-CM

## 2017-05-03 DIAGNOSIS — Z79899 Other long term (current) drug therapy: Secondary | ICD-10-CM

## 2017-05-03 DIAGNOSIS — S2241XA Multiple fractures of ribs, right side, initial encounter for closed fracture: Secondary | ICD-10-CM | POA: Diagnosis not present

## 2017-05-03 DIAGNOSIS — R000 Tachycardia, unspecified: Secondary | ICD-10-CM | POA: Diagnosis not present

## 2017-05-03 DIAGNOSIS — S2231XA Fracture of one rib, right side, initial encounter for closed fracture: Secondary | ICD-10-CM | POA: Diagnosis not present

## 2017-05-03 DIAGNOSIS — N390 Urinary tract infection, site not specified: Secondary | ICD-10-CM | POA: Diagnosis present

## 2017-05-03 DIAGNOSIS — K210 Gastro-esophageal reflux disease with esophagitis: Secondary | ICD-10-CM | POA: Diagnosis present

## 2017-05-03 DIAGNOSIS — I4581 Long QT syndrome: Secondary | ICD-10-CM | POA: Diagnosis present

## 2017-05-03 DIAGNOSIS — W19XXXD Unspecified fall, subsequent encounter: Secondary | ICD-10-CM | POA: Diagnosis not present

## 2017-05-03 DIAGNOSIS — R1084 Generalized abdominal pain: Secondary | ICD-10-CM

## 2017-05-03 DIAGNOSIS — I10 Essential (primary) hypertension: Secondary | ICD-10-CM | POA: Diagnosis not present

## 2017-05-03 DIAGNOSIS — N289 Disorder of kidney and ureter, unspecified: Secondary | ICD-10-CM | POA: Diagnosis not present

## 2017-05-03 DIAGNOSIS — R112 Nausea with vomiting, unspecified: Secondary | ICD-10-CM

## 2017-05-03 DIAGNOSIS — F419 Anxiety disorder, unspecified: Secondary | ICD-10-CM | POA: Diagnosis present

## 2017-05-03 DIAGNOSIS — K56609 Unspecified intestinal obstruction, unspecified as to partial versus complete obstruction: Principal | ICD-10-CM | POA: Diagnosis present

## 2017-05-03 DIAGNOSIS — W19XXXS Unspecified fall, sequela: Secondary | ICD-10-CM | POA: Diagnosis not present

## 2017-05-03 DIAGNOSIS — F0281 Dementia in other diseases classified elsewhere with behavioral disturbance: Secondary | ICD-10-CM | POA: Diagnosis not present

## 2017-05-03 DIAGNOSIS — K449 Diaphragmatic hernia without obstruction or gangrene: Secondary | ICD-10-CM | POA: Diagnosis present

## 2017-05-03 DIAGNOSIS — F319 Bipolar disorder, unspecified: Secondary | ICD-10-CM | POA: Diagnosis present

## 2017-05-03 DIAGNOSIS — Z4682 Encounter for fitting and adjustment of non-vascular catheter: Secondary | ICD-10-CM | POA: Diagnosis not present

## 2017-05-03 DIAGNOSIS — E785 Hyperlipidemia, unspecified: Secondary | ICD-10-CM | POA: Diagnosis present

## 2017-05-03 DIAGNOSIS — Z87891 Personal history of nicotine dependence: Secondary | ICD-10-CM | POA: Diagnosis not present

## 2017-05-03 DIAGNOSIS — E039 Hypothyroidism, unspecified: Secondary | ICD-10-CM | POA: Diagnosis present

## 2017-05-03 DIAGNOSIS — M069 Rheumatoid arthritis, unspecified: Secondary | ICD-10-CM | POA: Diagnosis not present

## 2017-05-03 DIAGNOSIS — Y92230 Patient room in hospital as the place of occurrence of the external cause: Secondary | ICD-10-CM | POA: Diagnosis not present

## 2017-05-03 DIAGNOSIS — W19XXXA Unspecified fall, initial encounter: Secondary | ICD-10-CM | POA: Diagnosis not present

## 2017-05-03 DIAGNOSIS — N179 Acute kidney failure, unspecified: Secondary | ICD-10-CM | POA: Diagnosis present

## 2017-05-03 DIAGNOSIS — S2231XD Fracture of one rib, right side, subsequent encounter for fracture with routine healing: Secondary | ICD-10-CM | POA: Diagnosis not present

## 2017-05-03 DIAGNOSIS — R1110 Vomiting, unspecified: Secondary | ICD-10-CM | POA: Diagnosis not present

## 2017-05-03 DIAGNOSIS — R079 Chest pain, unspecified: Secondary | ICD-10-CM | POA: Diagnosis not present

## 2017-05-03 LAB — URINALYSIS, ROUTINE W REFLEX MICROSCOPIC
BILIRUBIN URINE: NEGATIVE
Glucose, UA: NEGATIVE mg/dL
Hgb urine dipstick: NEGATIVE
Ketones, ur: NEGATIVE mg/dL
Nitrite: NEGATIVE
Protein, ur: NEGATIVE mg/dL
SPECIFIC GRAVITY, URINE: 1.029 (ref 1.005–1.030)
pH: 6 (ref 5.0–8.0)

## 2017-05-03 LAB — CBC WITH DIFFERENTIAL/PLATELET
Basophils Absolute: 0 10*3/uL (ref 0.0–0.1)
Basophils Relative: 0 %
EOS PCT: 0 %
Eosinophils Absolute: 0 10*3/uL (ref 0.0–0.7)
HCT: 46.1 % — ABNORMAL HIGH (ref 36.0–46.0)
Hemoglobin: 15 g/dL (ref 12.0–15.0)
LYMPHS ABS: 0.8 10*3/uL (ref 0.7–4.0)
LYMPHS PCT: 4 %
MCH: 31.6 pg (ref 26.0–34.0)
MCHC: 32.5 g/dL (ref 30.0–36.0)
MCV: 97.1 fL (ref 78.0–100.0)
MONO ABS: 2 10*3/uL — AB (ref 0.1–1.0)
MONOS PCT: 11 %
Neutro Abs: 15.1 10*3/uL — ABNORMAL HIGH (ref 1.7–7.7)
Neutrophils Relative %: 85 %
PLATELETS: 204 10*3/uL (ref 150–400)
RBC: 4.75 MIL/uL (ref 3.87–5.11)
RDW: 12.2 % (ref 11.5–15.5)
WBC: 17.9 10*3/uL — ABNORMAL HIGH (ref 4.0–10.5)

## 2017-05-03 LAB — COMPREHENSIVE METABOLIC PANEL
ALT: 20 U/L (ref 14–54)
AST: 24 U/L (ref 15–41)
Albumin: 4.4 g/dL (ref 3.5–5.0)
Alkaline Phosphatase: 84 U/L (ref 38–126)
Anion gap: 15 (ref 5–15)
BUN: 29 mg/dL — AB (ref 6–20)
CHLORIDE: 93 mmol/L — AB (ref 101–111)
CO2: 25 mmol/L (ref 22–32)
Calcium: 9.3 mg/dL (ref 8.9–10.3)
Creatinine, Ser: 1.51 mg/dL — ABNORMAL HIGH (ref 0.44–1.00)
GFR calc Af Amer: 36 mL/min — ABNORMAL LOW (ref >60)
GFR calc non Af Amer: 31 mL/min — ABNORMAL LOW (ref >60)
GLUCOSE: 166 mg/dL — AB (ref 65–99)
Potassium: 3.8 mmol/L (ref 3.5–5.1)
SODIUM: 133 mmol/L — AB (ref 135–145)
Total Bilirubin: 0.7 mg/dL (ref 0.3–1.2)
Total Protein: 8.4 g/dL — ABNORMAL HIGH (ref 6.5–8.1)

## 2017-05-03 LAB — TROPONIN I
Troponin I: <0.03 ng/mL (ref <0.03)
Troponin I: <0.03 ng/mL (ref <0.03)

## 2017-05-03 LAB — LIPASE, BLOOD: LIPASE: 22 U/L (ref 11–51)

## 2017-05-03 MED ORDER — FAMOTIDINE IN NACL 20-0.9 MG/50ML-% IV SOLN
20.0000 mg | Freq: Once | INTRAVENOUS | Status: AC
  Administered 2017-05-03: 20 mg via INTRAVENOUS
  Filled 2017-05-03: qty 50

## 2017-05-03 MED ORDER — SODIUM CHLORIDE 0.9 % IV BOLUS (SEPSIS)
1000.0000 mL | Freq: Once | INTRAVENOUS | Status: AC
  Administered 2017-05-03: 1000 mL via INTRAVENOUS

## 2017-05-03 MED ORDER — BUPRENORPHINE HCL 0.3 MG/ML IJ SOLN: 0.1500 mg | INTRAMUSCULAR | Status: DC | PRN

## 2017-05-03 MED ORDER — BUTORPHANOL TARTRATE 1 MG/ML IJ SOLN: 1.0000 mg | Freq: Once | INTRAMUSCULAR | Status: DC

## 2017-05-03 MED ORDER — LORAZEPAM 2 MG/ML IJ SOLN
0.5000 mg | Freq: Two times a day (BID) | INTRAMUSCULAR | Status: DC | PRN
  Administered 2017-05-04 – 2017-05-09 (×5): 0.5 mg via INTRAVENOUS
  Filled 2017-05-03 (×5): qty 1

## 2017-05-03 MED ORDER — LEVOTHYROXINE SODIUM 100 MCG IV SOLR
75.0000 ug | Freq: Every day | INTRAVENOUS | Status: DC
  Administered 2017-05-04 – 2017-05-06 (×3): 75 ug via INTRAVENOUS
  Filled 2017-05-03 (×6): qty 5

## 2017-05-03 MED ORDER — ONDANSETRON HCL 4 MG/2ML IJ SOLN
4.0000 mg | Freq: Once | INTRAMUSCULAR | Status: AC
Start: 2017-05-03 — End: 2017-05-03
  Administered 2017-05-03: 4 mg via INTRAVENOUS
  Filled 2017-05-03: qty 2

## 2017-05-03 MED ORDER — ONDANSETRON HCL 4 MG/2ML IJ SOLN
4.0000 mg | Freq: Four times a day (QID) | INTRAMUSCULAR | Status: DC | PRN
  Administered 2017-05-08: 4 mg via INTRAVENOUS
  Filled 2017-05-03: qty 2

## 2017-05-03 MED ORDER — CIPROFLOXACIN IN D5W 400 MG/200ML IV SOLN
400.0000 mg | INTRAVENOUS | Status: DC
  Administered 2017-05-03: 400 mg via INTRAVENOUS
  Filled 2017-05-03: qty 200

## 2017-05-03 MED ORDER — SODIUM CHLORIDE 0.9 % IV BOLUS (SEPSIS)
1000.0000 mL | Freq: Once | INTRAVENOUS | Status: AC
Start: 2017-05-03 — End: 2017-05-03
  Administered 2017-05-03: 1000 mL via INTRAVENOUS

## 2017-05-03 MED ORDER — IOPAMIDOL (ISOVUE-300) INJECTION 61%
75.0000 mL | Freq: Once | INTRAVENOUS | Status: AC | PRN
  Administered 2017-05-03: 75 mL via INTRAVENOUS

## 2017-05-03 MED ORDER — BUTORPHANOL TARTRATE 1 MG/ML IJ SOLN: 1.0000 mg | INTRAMUSCULAR | Status: DC | PRN

## 2017-05-03 MED ORDER — ACETAMINOPHEN 650 MG RE SUPP
650.0000 mg | Freq: Four times a day (QID) | RECTAL | Status: DC | PRN
  Administered 2017-05-04 – 2017-05-06 (×4): 650 mg via RECTAL
  Filled 2017-05-03 (×4): qty 1

## 2017-05-03 MED ORDER — PANTOPRAZOLE SODIUM 40 MG IV SOLR
40.0000 mg | INTRAVENOUS | Status: DC
  Administered 2017-05-03 – 2017-05-07 (×5): 40 mg via INTRAVENOUS
  Filled 2017-05-03 (×5): qty 40

## 2017-05-03 MED ORDER — ACETAMINOPHEN 325 MG PO TABS
650.0000 mg | ORAL_TABLET | Freq: Four times a day (QID) | ORAL | Status: DC | PRN
  Administered 2017-05-07: 650 mg via ORAL
  Filled 2017-05-03 (×2): qty 2

## 2017-05-03 MED ORDER — SELEGILINE 6 MG/24HR TD PT24
6.0000 mg | MEDICATED_PATCH | Freq: Every day | TRANSDERMAL | Status: DC
  Administered 2017-05-03: 6 mg via TRANSDERMAL
  Filled 2017-05-03 (×3): qty 1

## 2017-05-03 MED ORDER — GI COCKTAIL ~~LOC~~
30.0000 mL | Freq: Once | ORAL | Status: AC
Start: 2017-05-03 — End: 2017-05-03
  Administered 2017-05-03: 30 mL via ORAL
  Filled 2017-05-03: qty 30

## 2017-05-03 MED ORDER — METOPROLOL TARTRATE 5 MG/5ML IV SOLN
2.5000 mg | Freq: Four times a day (QID) | INTRAVENOUS | Status: DC
  Administered 2017-05-03 – 2017-05-06 (×12): 2.5 mg via INTRAVENOUS
  Filled 2017-05-03 (×13): qty 5

## 2017-05-03 MED ORDER — SODIUM CHLORIDE 0.9 % IV SOLN
INTRAVENOUS | Status: DC
  Administered 2017-05-03 – 2017-05-04 (×2): via INTRAVENOUS

## 2017-05-03 MED ORDER — AMLODIPINE BESYLATE 5 MG PO TABS: 2.5000 mg | ORAL_TABLET | Freq: Two times a day (BID) | ORAL | Status: DC

## 2017-05-03 MED ORDER — MORPHINE SULFATE (PF) 4 MG/ML IV SOLN: 4.0000 mg | Freq: Once | INTRAVENOUS | Status: DC

## 2017-05-03 MED ORDER — METOPROLOL TARTRATE 5 MG/5ML IV SOLN: 2.5000 mg | Freq: Three times a day (TID) | INTRAVENOUS | Status: DC

## 2017-05-03 NOTE — ED Provider Notes (Signed)
[facility] EMERGENCY DEPARTMENT Provider Note   CSN: [id number] Arrival date & time: 05/03/17  1304     History   Chief Complaint Chief Complaint  Patient presents with  . Emesis    HPI Erica [name] is a 84 y.o. female.  Pt presents to the ED today with n/v.  She has also had burning in her chest c/w GERD sx.  Pt said sx started 2 days ago.  She went to her pcp who sent her here for IVFs and eval.      Past Medical History:  Diagnosis Date  . Anxiety   . Bipolar disorder (HCC)   . Hyperlipidemia   . Hypothyroidism   . Irritable bowel syndrome   . RA (rheumatoid arthritis) [facility])     Patient Active Problem List   Diagnosis Date Noted  . Bipolar disorder with severe depression (HCC) 11/23/2016  . Hypertension 11/23/2016  . Rheumatoid arthritis involving both hands with positive rheumatoid factor (HCC) 06/01/2016  . Partial small bowel obstruction (HCC) 03/12/2015  . Depression 03/12/2015  . Hypotension 03/12/2015  . Lactic acidosis 03/12/2015  . Dehydration 03/12/2015  . SBO (small bowel obstruction) (HCC) 03/12/2015  . Hypothyroidism 02/21/2014  . 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 Major depressive disorder, recurrent episode with anxious distress (HCC) 02/13/2014  . Rapid heart rate 08/24/2013  . S/P arthroscopy of shoulder 08/14/2013  . Muscle weakness (generalized) 06/14/2013  . Rotator cuff syndrome of right shoulder 12/05/2012  . Right shoulder pain 12/05/2012  . Rectal bleeding 06/06/2011  . Epigastric pain 04/19/2011  . GERD (gastroesophageal reflux disease) 04/19/2011  . IBS (irritable bowel syndrome) 04/19/2011  . METATARSALGIA 05/12/2009  . BUNION 05/12/2009    Past Surgical History:  Procedure Laterality Date  . ABDOMINAL HYSTERECTOMY    . Bone Spurs  08/07/13   Right Shoulder  . COLONOSCOPY    . COLONOSCOPY N/A 05/08/2012   Procedure: COLONOSCOPY;  Surgeon: Malissa Hippo, MD;  Location: [facility];  Service: Endoscopy;  Laterality:  N/A;  730  . COLOSTOMY    . ESOPHAGOGASTRODUODENOSCOPY N/A 11/04/2013   Procedure: ESOPHAGOGASTRODUODENOSCOPY (EGD);  Surgeon: Malissa Hippo, MD;  Location: [facility] ENDO SUITE;  Service: Endoscopy;  Laterality: N/A;  730  . EYE SURGERY  cataract x 2  . MALONEY DILATION N/A 11/04/2013   Procedure: Elease Hashimoto DILATION;  Surgeon: Malissa Hippo, MD;  Location: [facility] ENDO SUITE;  Service: Endoscopy;  Laterality: N/A;  . UPPER GASTROINTESTINAL ENDOSCOPY      OB History    No data available       Home Medications    Prior to Admission medications   Medication Sig Start Date End Date Taking? Authorizing Provider  acetaminophen (TYLENOL) 650 MG CR tablet Take 1,300 mg by mouth daily.   Yes [provider]  amLODipine (NORVASC) 2.5 MG tablet Take 1 tablet (2.5 mg total) by mouth 2 (two) times daily. 04/05/17  Yes Hagler, Fleet Contras, MD  Cyanocobalamin (VITAMIN B-12 PO) Take by mouth daily.   Yes [provider]  levothyroxine (SYNTHROID, LEVOTHROID) 75 MCG tablet Take 1 tablet (75 mcg total) by mouth daily. Please dispense brand only thyroid medication/Synthroid. 04/06/17  Yes Hagler, Fleet Contras, MD  LORazepam (ATIVAN) 0.5 MG tablet Take 1/2 to 1 tablet by mouth twice a day as needed 01/31/17  Yes Hagler, Fleet Contras, MD  NON FORMULARY CBD OIL - 3 DROPS TOPICAL   Yes [provider]  pantoprazole (PROTONIX) 40 MG tablet TAKE ONE TABLET BY MOUTH  DAILY BEFORE BREAKFAST. 11/29/16  Yes Rehman, Joline Maxcy, MD  selegiline (EMSAM) 6 MG/24HR Place 1 patch (6 mg total) onto the skin daily. 12/07/16  Yes Aliene Beams, MD    Family History Family History  Problem Relation Age of Onset  . Colon cancer Mother   . Bipolar disorder Mother   . Anxiety disorder Mother   . Atrial fibrillation Son   . Depression Maternal Grandmother     Social History Social History   Tobacco Use  . Smoking status: Former Smoker    Packs/day: 1.50    Years: 27.00    Pack years: 40.50    Types: Cigarettes    Last  attempt to quit: 04/18/1981    Years since quitting: 36.0  . Smokeless tobacco: Former Neurosurgeon    Quit date: 11/27/1980  Substance Use Topics  . Alcohol use: No    Alcohol/week: 0.0 oz  . Drug use: No     Allergies   Penicillins   Review of Systems Review of Systems  Cardiovascular: Positive for chest pain.  Gastrointestinal: Positive for nausea and vomiting.  All other systems reviewed and are negative.    Physical Exam Updated Vital Signs BP (!) 168/85   Pulse (!) 117   Temp 97.9 F (36.6 C) (Oral)   Resp 16   Ht 5' 2.5" (1.588 m)   Wt 54.9 kg (121 lb)   SpO2 96%   BMI 21.78 kg/m   Physical Exam  Constitutional: She is oriented to person, place, and time. She appears well-developed and well-nourished.  HENT:  Head: Normocephalic and atraumatic.  Right Ear: External ear normal.  Left Ear: External ear normal.  Nose: Nose normal.  Mouth/Throat: Mucous membranes are dry.  Eyes: Conjunctivae and EOM are normal. Pupils are equal, round, and reactive to light.  Neck: Normal range of motion. Neck supple.  Cardiovascular: Regular rhythm, normal heart sounds and intact distal pulses. Tachycardia present.  Pulmonary/Chest: Effort normal and breath sounds normal.  Abdominal: Soft. Bowel sounds are normal. She exhibits distension. There is generalized tenderness.  Musculoskeletal: Normal range of motion.  Neurological: She is alert and oriented to person, place, and time.  Skin: Skin is warm and dry. Capillary refill takes less than 2 seconds.  Psychiatric: She has a normal mood and affect. Her behavior is normal. Judgment and thought content normal.  Nursing note and vitals reviewed.    ED Treatments / Results  Labs (all labs ordered are listed, but only abnormal results are displayed) Labs Reviewed  COMPREHENSIVE METABOLIC PANEL - Abnormal; Notable for the following components:      Result Value   Sodium 133 (*)    Chloride 93 (*)    Glucose, Bld 166 (*)    BUN 29  (*)    Creatinine, Ser 1.51 (*)    Total Protein 8.4 (*)    GFR calc non Af Amer 31 (*)    GFR calc Af Amer 36 (*)    All other components within normal limits  CBC WITH DIFFERENTIAL/PLATELET - Abnormal; Notable for the following components:   WBC 17.9 (*)    HCT 46.1 (*)    Neutro Abs 15.1 (*)    Monocytes Absolute 2.0 (*)    All other components within normal limits  TROPONIN I  LIPASE, BLOOD  URINALYSIS, ROUTINE W REFLEX MICROSCOPIC    EKG  EKG Interpretation  Date/Time:  Wednesday May 03 2017 13:33:30 EST Ventricular Rate:  116 PR Interval:    QRS  Duration: 91 QT Interval:  355 QTC Calculation: 494 R Axis:   51 Text Interpretation:  Sinus tachycardia Consider right atrial enlargement Borderline T abnormalities, anterior leads ST elevation, consider inferior injury Borderline prolonged QT interval No significant change since last tracing Confirmed by Jacalyn Lefevre [phone]) on 05/03/2017 1:41:18 PM       Radiology Ct Abdomen Pelvis W Contrast  Result Date: 05/03/2017 CLINICAL DATA:  Nausea and vomiting with epigastric burning sensation. Duration: 1 day. EXAM: CT ABDOMEN AND PELVIS WITH CONTRAST TECHNIQUE: Multidetector CT imaging of the abdomen and pelvis was performed using the standard protocol following bolus administration of intravenous contrast. CONTRAST:  7mL ISOVUE-300 IOPAMIDOL (ISOVUE-300) INJECTION 61% COMPARISON:  03/17/2015 CT scan FINDINGS: Lower chest: Bandlike densities compatible with scarring or atelectasis in both lower lobes and in the lingula. Distal esophageal wall thickening with fluid-filled distal esophagus. Small type 1 hiatal hernia, distal esophagus and hernia with widely patent connection to the somewhat distended stomach. Circumflex coronary artery atherosclerotic calcification. Hepatobiliary: Stable 6 mm hypodense lesion in the dome of the right hepatic lobe on image 9/2. Stable similar lesion in the lateral segment left hepatic lobe, image  16/2. These lesions are too small to characterize but statistically likely to be benign. Small Phrygian cap along the gallbladder. Common bile duct 6 mm in diameter, within normal limits. Pancreas: Unremarkable Spleen: Unremarkable Adrenals/Urinary Tract: Adrenal glands normal. Bilateral renal peripelvic cysts. 2 mm left kidney lower pole nonobstructive renal calculus. Stomach/Bowel: Notably distended stomach with air-fluid level. The duodenum never crosses the midline, favoring small bowel malrotation. Dilated duodenum and loops of jejunum up to 4.4 cm with internal air-fluid levels, terminating in a somewhat angulated loop in the vicinity of image 60/2 distal to which the small bowel has a normal/decompressed caliber. Anastomotic staple line in the proximal sigmoid colon. Mild sigmoid colon diverticulosis. Vascular/Lymphatic: Aortoiliac atherosclerotic vascular disease. No pathologic adenopathy identified. Reproductive: Uterus absent.  Adnexa unremarkable. Other: No supplemental non-categorized findings. Musculoskeletal: Levoconvex lumbar scoliosis with rotary component. IMPRESSION: 1. Small bowel obstruction with a angulated loop in the upper pelvis just to the right of midline representing the transition between dilated and nondilated bowel, probably due to an adhesion. Distended small bowel loops with air-fluid levels; distended stomach with an air-fluid level. 2. Small hiatal hernia. 3. Fluid-filled thick-walled distal esophagus favoring esophagitis. 4. Other imaging findings of potential clinical significance: Aortic Atherosclerosis (ICD10-I70.0). Coronary atherosclerosis. 2 mm left kidney lower pole nonobstructive renal calculus. Levoconvex lumbar scoliosis with rotary component. Electronically Signed   By: Gaylyn Rong M.D.   On: 05/03/2017 15:39    Procedures Procedures (including critical care time)  Medications Ordered in ED Medications  butorphanol (STADOL) injection 1 mg (not  administered)  sodium chloride 0.9 % bolus 1,000 mL (0 mLs Intravenous Stopped 05/03/17 1435)  ondansetron (ZOFRAN) injection 4 mg (4 mg Intravenous Given 05/03/17 1333)  gi cocktail (Maalox,Lidocaine,Donnatal) (30 mLs Oral Given 05/03/17 1332)  famotidine (PEPCID) IVPB 20 mg premix (0 mg Intravenous Stopped 05/03/17 1403)  sodium chloride 0.9 % bolus 1,000 mL (1,000 mLs Intravenous New Bag/Given 05/03/17 1526)  iopamidol (ISOVUE-300) 61 % injection 75 mL (75 mLs Intravenous Contrast Given 05/03/17 1504)     Initial Impression / Assessment and Plan / ED Course  I have reviewed the triage vital signs and the nursing notes.  Pertinent labs & imaging results that were available during my care of the patient were reviewed by me and considered in my medical decision making (see chart for details).  Pt is on Emsam (selegiline) which is contraindicated with morphine, dilaudid, fentanyl.  Pt d/w pharmacy who recommends butorphanol for pain if she needs IV.  Pt d/w Dr. Selena Batten ([facility]) who will admit.  Final Clinical Impressions(s) / ED Diagnoses   Final diagnoses:  Dehydration  Small bowel obstruction [facility])  Esophagitis    ED Discharge Orders    None       Jacalyn Lefevre, MD 05/03/17 1610

## 2017-05-03 NOTE — ED Triage Notes (Signed)
Pt here from PCP doctor with possible dehydration. Pt has had nausea and vomiting.

## 2017-05-03 NOTE — H&P (Addendum)
[facility] H&P   Patient Demographics:    Erica [name], is a 84 y.o. female  MRN: [id number]   DOB - 27-Jun-1932  Admit Date - 05/03/2017  Outpatient Primary MD for the patient is Aliene Beams, MD  Referring MD/NP/PA:  Jacalyn Lefevre  Outpatient Specialists:   Patient coming from: home  Chief Complaint  Patient presents with  . Emesis      HPI:    Erica [name]  is a 84 y.o. female, w Hyperlipidemia, Hypothyroidism, RA, Bipolar, Anxiety, Bipolar, prior hysterectomy, colostomy, remote hx of bowel obstruction apparently presents w c/o n/v, abd pain.  Pt states that she was having abdominal discomfort for the past 2 days, and presented to ER last nite but the wait time was exceptionally long and therefore left because she was feeling slightly better.    Today, pt had n/v, (no hematemesis), and c/o abdominal pain, "sharp" and distension.  Pt denies fever, chills, constipation, brbpr, black stool.  Pt notes she has been trying to wean herself off protonix. Pt went to pcp today and was sent to ER for evaluation of viral gastritis.   In ED,  CT abd/ pelvis: IMPRESSION: 1. Small bowel obstruction with a angulated loop in the upper pelvis just to the right of midline representing the transition between dilated and nondilated bowel, probably due to an adhesion. Distended small bowel loops with air-fluid levels; distended stomach with an air-fluid level. 2. Small hiatal hernia. 3. Fluid-filled thick-walled distal esophagus favoring esophagitis.   Na 133 Glucose 166 Bun 29, creatinine 1.51  Ast 24, Alt 20 Trop <0.03 Lipase 22 Urinalysis 6-30 wbc, 6-30 rbc.   ED consulted surgery   Pt will be admitted for evaluation of SBO.     Review of systems:    In addition to the HPI above,   No Fever-chills, No Headache, No changes with Vision or hearing, No problems swallowing food  or Liquids, No Chest pain, Cough or Shortness of Breath, No Blood in stool or Urine, No dysuria, No new skin rashes or bruises, No new joints pains-aches,  No new weakness, tingling, numbness in any extremity, No recent weight gain or loss, No polyuria, polydypsia or polyphagia, No significant Mental Stressors.  A full 10 point Review of Systems was done, except as stated above, all other Review of Systems were negative.   With Past History of the following :    Past Medical History:  Diagnosis Date  . Anxiety   . Bipolar disorder (HCC)   . Hyperlipidemia   . Hypothyroidism   . Irritable bowel syndrome   . RA (rheumatoid arthritis) (HCC)       Past Surgical History:  Procedure Laterality Date  . ABDOMINAL HYSTERECTOMY    . Bone Spurs  08/07/13   Right Shoulder  . COLONOSCOPY    . COLONOSCOPY N/A 05/08/2012   Procedure: COLONOSCOPY;  Surgeon: Ok Anis  Cline Crock, MD;  Location: [facility];  Service: Endoscopy;  Laterality: N/A;  730  . COLOSTOMY    . ESOPHAGOGASTRODUODENOSCOPY N/A 11/04/2013   Procedure: ESOPHAGOGASTRODUODENOSCOPY (EGD);  Surgeon: Malissa Hippo, MD;  Location: [facility];  Service: Endoscopy;  Laterality: N/A;  730  . EYE SURGERY  cataract x 2  . MALONEY DILATION N/A 11/04/2013   Procedure: Elease Hashimoto DILATION;  Surgeon: Malissa Hippo, MD;  Location: [facility];  Service: Endoscopy;  Laterality: N/A;  . UPPER GASTROINTESTINAL ENDOSCOPY        Social History:     Social History   Tobacco Use  . Smoking status: Former Smoker    Packs/day: 1.50    Years: 27.00    Pack years: 40.50    Types: Cigarettes    Last attempt to quit: 04/18/1981    Years since quitting: 36.0  . Smokeless tobacco: Former Neurosurgeon    Quit date: 11/27/1980  Substance Use Topics  . Alcohol use: No    Alcohol/week: 0.0 oz     Lives - at home  Mobility - walks by self   Family History :     Family History  Problem Relation Age of Onset  . Colon cancer Mother   .  Bipolar disorder Mother   . Anxiety disorder Mother   . Atrial fibrillation Son   . Depression Maternal Grandmother       Home Medications:   Prior to Admission medications   Medication Sig Start Date End Date Taking? Authorizing Provider  acetaminophen (TYLENOL) 650 MG CR tablet Take 1,300 mg by mouth daily.   Yes [provider]  amLODipine (NORVASC) 2.5 MG tablet Take 1 tablet (2.5 mg total) by mouth 2 (two) times daily. 04/05/17  Yes Hagler, Fleet Contras, MD  Cyanocobalamin (VITAMIN B-12 PO) Take by mouth daily.   Yes [provider]  levothyroxine (SYNTHROID, LEVOTHROID) 75 MCG tablet Take 1 tablet (75 mcg total) by mouth daily. Please dispense brand only thyroid medication/Synthroid. 04/06/17  Yes Hagler, Fleet Contras, MD  LORazepam (ATIVAN) 0.5 MG tablet Take 1/2 to 1 tablet by mouth twice a day as needed 01/31/17  Yes Hagler, Fleet Contras, MD  NON FORMULARY CBD OIL - 3 DROPS TOPICAL   Yes [provider]  pantoprazole (PROTONIX) 40 MG tablet TAKE ONE TABLET BY MOUTH DAILY BEFORE BREAKFAST. 11/29/16  Yes Rehman, Joline Maxcy, MD  selegiline (EMSAM) 6 MG/24HR Place 1 patch (6 mg total) onto the skin daily. 12/07/16  Yes Aliene Beams, MD     Allergies:     Allergies  Allergen Reactions  . Penicillins Itching and Rash     Physical Exam:   Vitals  Blood pressure (!) 168/85, pulse (!) 117, temperature 97.9 F (36.6 C), temperature source Oral, resp. rate 16, height 5' 2.5" (1.588 m), weight 54.9 kg (121 lb), SpO2 96 %.   1. General  lying in bed in NAD,   2. Normal affect and insight, Not Suicidal or Homicidal, Awake Alert, Oriented X 3.  3. No F.N deficits, ALL C.Nerves Intact, Strength 5/5 all 4 extremities, Sensation intact all 4 extremities, Plantars down going.  4. Ears and Eyes appear Normal, Conjunctivae clear, PERRLA. Moist Oral Mucosa.  5. Supple Neck, No JVD, No cervical lymphadenopathy appriciated, No Carotid Bruits.  6. Symmetrical Chest wall movement,  Good air movement bilaterally, CTAB.  7. Tachy s1, s2, no m/g/r    8. Slight distention, Positive Bowel Sounds, Abdomen Soft, No tenderness, No organomegaly appriciated,No  rebound -guarding or rigidity.  9.  No Cyanosis, Normal Skin Turgor, No Skin Rash or Bruise.  10. Good muscle tone,  joints appear normal , no effusions, Normal ROM.  11. No Palpable Lymph Nodes in Neck or Axillae     Data Review:    CBC Recent Labs  Lab 05/03/17 1332  WBC 17.9*  HGB 15.0  HCT 46.1*  PLT 204  MCV 97.1  MCH 31.6  MCHC 32.5  RDW 12.2  LYMPHSABS 0.8  MONOABS 2.0*  EOSABS 0.0  BASOSABS 0.0   ------------------------------------------------------------------------------------------------------------------  Chemistries  Recent Labs  Lab 05/03/17 1332  NA 133*  K 3.8  CL 93*  CO2 25  GLUCOSE 166*  BUN 29*  CREATININE 1.51*  CALCIUM 9.3  AST 24  ALT 20  ALKPHOS 84  BILITOT 0.7   ------------------------------------------------------------------------------------------------------------------ estimated creatinine clearance is 23.3 mL/min (A) (by C-G formula based on SCr of 1.51 mg/dL (H)). ------------------------------------------------------------------------------------------------------------------ No results for input(s): TSH, T4TOTAL, T3FREE, THYROIDAB in the last 72 hours.  Invalid input(s): FREET3  Coagulation profile No results for input(s): INR, PROTIME in the last 168 hours. ------------------------------------------------------------------------------------------------------------------- No results for input(s): DDIMER in the last 72 hours. -------------------------------------------------------------------------------------------------------------------  Cardiac Enzymes Recent Labs  Lab 05/03/17 1332  TROPONINI <0.03   ------------------------------------------------------------------------------------------------------------------ No results found for:  BNP   ---------------------------------------------------------------------------------------------------------------  Urinalysis    Component Value Date/Time   COLORURINE YELLOW 03/12/2015 1220   APPEARANCEUR Clear 11/28/2016 1538   LABSPEC 1.025 03/12/2015 1220   PHURINE 5.5 03/12/2015 1220   GLUCOSEU Negative 11/28/2016 1538   HGBUR SMALL (A) 03/12/2015 1220   BILIRUBINUR Negative 11/28/2016 1538   KETONESUR NEGATIVE 03/12/2015 1220   PROTEINUR Negative 11/28/2016 1538   PROTEINUR 30 (A) 03/12/2015 1220   NITRITE Negative 11/28/2016 1538   NITRITE NEGATIVE 03/12/2015 1220   LEUKOCYTESUR 1+ (A) 11/28/2016 1538    ----------------------------------------------------------------------------------------------------------------   Imaging Results:    Ct Abdomen Pelvis W Contrast  Result Date: 05/03/2017 CLINICAL DATA:  Nausea and vomiting with epigastric burning sensation. Duration: 1 day. EXAM: CT ABDOMEN AND PELVIS WITH CONTRAST TECHNIQUE: Multidetector CT imaging of the abdomen and pelvis was performed using the standard protocol following bolus administration of intravenous contrast. CONTRAST:  75mL ISOVUE-300 IOPAMIDOL (ISOVUE-300) INJECTION 61% COMPARISON:  03/17/2015 CT scan FINDINGS: Lower chest: Bandlike densities compatible with scarring or atelectasis in both lower lobes and in the lingula. Distal esophageal wall thickening with fluid-filled distal esophagus. Small type 1 hiatal hernia, distal esophagus and hernia with widely patent connection to the somewhat distended stomach. Circumflex coronary artery atherosclerotic calcification. Hepatobiliary: Stable 6 mm hypodense lesion in the dome of the right hepatic lobe on image 9/2. Stable similar lesion in the lateral segment left hepatic lobe, image 16/2. These lesions are too small to characterize but statistically likely to be benign. Small Phrygian cap along the gallbladder. Common bile duct 6 mm in diameter, within normal  limits. Pancreas: Unremarkable Spleen: Unremarkable Adrenals/Urinary Tract: Adrenal glands normal. Bilateral renal peripelvic cysts. 2 mm left kidney lower pole nonobstructive renal calculus. Stomach/Bowel: Notably distended stomach with air-fluid level. The duodenum never crosses the midline, favoring small bowel malrotation. Dilated duodenum and loops of jejunum up to 4.4 cm with internal air-fluid levels, terminating in a somewhat angulated loop in the vicinity of image 60/2 distal to which the small bowel has a normal/decompressed caliber. Anastomotic staple line in the proximal sigmoid colon. Mild sigmoid colon diverticulosis. Vascular/Lymphatic: Aortoiliac atherosclerotic vascular disease. No pathologic adenopathy identified. Reproductive: Uterus absent.  Adnexa unremarkable. Other: No supplemental non-categorized findings. Musculoskeletal: Levoconvex lumbar scoliosis with rotary component. IMPRESSION: 1. Small bowel obstruction with a angulated loop in the upper pelvis just to the right of midline representing the transition between dilated and nondilated bowel, probably due to an adhesion. Distended small bowel loops with air-fluid levels; distended stomach with an air-fluid level. 2. Small hiatal hernia. 3. Fluid-filled thick-walled distal esophagus favoring esophagitis. 4. Other imaging findings of potential clinical significance: Aortic Atherosclerosis (ICD10-I70.0). Coronary atherosclerosis. 2 mm left kidney lower pole nonobstructive renal calculus. Levoconvex lumbar scoliosis with rotary component. Electronically Signed   By: Gaylyn Rong M.D.   On: 05/03/2017 15:39       Assessment & Plan:    Active Problems:   SBO (small bowel obstruction) (HCC)    SBO NPO NGT to low intermittent suction Stadol 1mg  iv q4h prn pain Surgery consulted by Ed, appreciate input  N/v, hiatal hernia, Gerd Zofran 4mg  iv q6h prn  Protonix 40mg  iv qday  Tachycardia Tele Trop I q6h  x3  Hyponatremia Hydrate with ns iv Check cmp in am  Renal insufficiency likely seconadary to n/v Hydrate with ns iv Check cmp in am  UTI Urine culture ordered cipro iv pharmacy to dose  Hypertension D/c Amlodipine Metoprolol 2.5mg  iv q6h   Hypothyroidism Cont levothyroxine  Bipolar Cont  Selegeline, note this drug has multiple drug interactions  DVT Prophylaxis - SCDs   AM Labs Ordered, also please review Full Orders  Family Communication: Admission, patients condition and plan of care including tests being ordered have been discussed with the patient who indicate understanding and agree with the plan and Code Status.  Code Status FULL CODE  Likely DC to  home  Condition GUARDED    Consults called: surgery by ED  Admission status: inpatient  Time spent in minutes : 45   M.D on 05/03/2017 at 4:55 PM  Between 7am to 7pm - Pager - [phone]   After 7pm go to [email or web address] - password [facility]  [facility] - Office  [phone]

## 2017-05-03 NOTE — ED Notes (Signed)
Report given to Dorathy Daft, RN for room 305

## 2017-05-03 NOTE — Progress Notes (Signed)
Patient ID: Erica [name], female    DOB: October 10, 1932, 84 y.o.   MRN: [id number]  Chief Complaint  Patient presents with  . Sore Throat  . Abdominal Pain  . Nausea  . Emesis  . Gastroesophageal Reflux    Allergies Penicillins  Subjective:   Erica [name] is a 84 y.o. female who presents to [facility] today.  HPI Erica [name] presents to the office today with her daughter-in-law for an acute visit.  She comes in today after going to the emergency department last night secondary to abdominal pain.  She was not seen and evaluated in the emergency department because the wait was very long and she did not feel that she could sit in the lobby therefore she went home and comes in today.  She reports that for the past 3 days her stomach has been quite unsettled.  She denies any diarrhea but has had some emesis.  She last vomited at approximately 2 AM and reports it was yellowish greenish in color with no associated blood.  She reports that last night her abdomen was diffusely tender across the whole abdomen.  She reports that now her pain is mainly in the right upper quadrant and it hurts on and off.  She describes the pain as sharp in quality.  Pain is approximately 5 out of 10 with no radiation.  She reports that she did have a bowel movement this morning after feeling constipated yesterday.  She is unsure when she last urinated.  She denies any dysuria.  She reports her urine has been concentrated.  She has not been eating or drinking much for the past several days.  She reports that she missed several doses of her PPI medication that she takes every other day for reflux.  She reports that since last night she has had a burning pain down her esophagus and it hurts to swallow.  She reports that she has had some pain with swallowing for the past several days.  She reports that she has not had any chest pain or shortness of breath.  She does report that she felt last night and this  morning like she might pass out.  She reports that upon standing she feels a little bit lightheaded and woozy.  She did take a lorazepam pill last night after getting home from the hospital at approximately 9:51 PM.  She denies any cough.  She denies any fevers or night sweats.  She denies any palpitations.  She denies any rashes.  Her daughter in law reports that she believes she is behaving in a normal fashion.  She denies any headaches.  She did not take her blood pressure medication this morning because she did not feel well.  She did change her Emsam patch as directed.  She reports her mood has been fine.  Patient's daughter-in-law reports that last night patient got up because she felt like she was going to be sick on her stomach and went to sit down on the toilet.  She reports that patient became very weak and she might have passed out for a few seconds.     Past Medical History:  Diagnosis Date  . Anxiety   . Bipolar disorder (HCC)   . Hyperlipidemia   . Hypothyroidism   . Irritable bowel syndrome   . RA (rheumatoid arthritis) (HCC)     Past Surgical History:  Procedure Laterality Date  . ABDOMINAL HYSTERECTOMY    . Bone Spurs  08/07/13   Right Shoulder  . COLONOSCOPY    . COLONOSCOPY N/A 05/08/2012   Procedure: COLONOSCOPY;  Surgeon: Malissa Hippo, MD;  [facility];  Service: Endoscopy;  Laterality: N/A;  730  . COLOSTOMY    . ESOPHAGOGASTRODUODENOSCOPY N/A 11/04/2013   Procedure: ESOPHAGOGASTRODUODENOSCOPY (EGD);  Surgeon: Malissa Hippo, MD;  Location: [facility];  Service: Endoscopy;  Laterality: N/A;  730  . EYE SURGERY  cataract x 2  . MALONEY DILATION N/A 11/04/2013   Procedure: Elease Hashimoto DILATION;  Surgeon: Malissa Hippo, MD;  Location: [facility];  Service: Endoscopy;  Laterality: N/A;  . UPPER GASTROINTESTINAL ENDOSCOPY      Family History  Problem Relation Age of Onset  . Colon cancer Mother   . Bipolar disorder Mother   . Anxiety disorder  Mother   . Atrial fibrillation Son   . Depression Maternal Grandmother      Social History   Socioeconomic History  . Marital status: Married    Spouse name: None  . Number of children: None  . Years of education: None  . Highest education level: None  Social Needs  . Financial resource strain: None  . Food insecurity - worry: None  . Food insecurity - inability: None  . Transportation needs - medical: None  . Transportation needs - non-medical: None  Occupational History  . None  Tobacco Use  . Smoking status: Former Smoker    Packs/day: 1.50    Years: 27.00    Pack years: 40.50    Types: Cigarettes    Last attempt to quit: 04/18/1981    Years since quitting: 36.0  . Smokeless tobacco: Former Neurosurgeon    Quit date: 11/27/1980  Substance and Sexual Activity  . Alcohol use: No    Alcohol/week: 0.0 oz  . Drug use: No  . Sexual activity: No    Partners: Male  Other Topics Concern  . None  Social History Narrative  . None    Review of Systems  Constitutional: Positive for activity change, appetite change and fatigue.  HENT: Positive for sore throat, trouble swallowing and voice change. Negative for congestion, mouth sores, postnasal drip, rhinorrhea, sinus pressure and sinus pain.   Eyes: Negative for visual disturbance.  Respiratory: Negative for cough, chest tightness, shortness of breath, wheezing and stridor.   Cardiovascular: Negative for chest pain, palpitations and leg swelling.  Gastrointestinal: Positive for abdominal pain, nausea and vomiting. Negative for constipation and diarrhea.  Endocrine: Negative for polyphagia and polyuria.  Genitourinary: Negative for dysuria, hematuria and urgency.  Musculoskeletal: Negative for back pain.  Skin: Negative for rash.  Neurological: Positive for dizziness and light-headedness. Negative for tremors, speech difficulty, weakness and numbness.  Hematological: Negative for adenopathy. Does not bruise/bleed easily.    Psychiatric/Behavioral: Negative for agitation and dysphoric mood. The patient is not nervous/anxious.      Objective:   BP (!) 140/98 (BP Location: Left Arm, Patient Position: Sitting, Cuff Size: Normal)   Pulse 82   Temp 98.4 F (36.9 C) (Temporal)   Resp 16   Ht 5\' 3"  (1.6 m)   Wt 121 lb (54.9 kg)   SpO2 94%   BMI 21.43 kg/m   Physical Exam  Constitutional: She is oriented to person, place, and time. She appears ill. No distress.  Weak appearing elderly female, slightly slumped sitting in exam chair.  Usually very well dressed and groomed, holding emesis basin and having episodes of anticipated vomiting.  Appearance consistent  with stated age.  HENT:  Head: Normocephalic.  Mouth/Throat: Uvula is midline. Mucous membranes are dry. No oral lesions. No uvula swelling. No oropharyngeal exudate.  Eyes: EOM are normal. Pupils are equal, round, and reactive to light.  Pupils constricted.  Neck: Normal range of motion. Neck supple. No thyromegaly present.  Cardiovascular: Normal heart sounds. Tachycardia present.  No murmur heard. Heart rate 128  Blood pressure rechecked by myself 110/68  Abdominal: Soft. Bowel sounds are normal. She exhibits distension. She exhibits no shifting dullness and no mass. There is tenderness in the right upper quadrant, epigastric area and periumbilical area. There is no rebound and no guarding.  Patient tender with palpation throughout abdomen.  She reports no tenderness but grimaces with pain with palpation.  No rebound or guarding present.  Lymphadenopathy:    She has no cervical adenopathy.  Neurological: She is alert and oriented to person, place, and time.  Skin: Skin is warm and dry. Capillary refill takes 2 to 3 seconds.  Psychiatric: She has a normal mood and affect.     Assessment and Plan  1. Generalized abdominal pain 2. Nausea and vomiting, intractability of vomiting not specified, unspecified vomiting type 3.  Dehydration  84 year old elderly female presenting with abdominal pain with associated vomiting and inability to take p.o. liquids and solids.  Evidence of dehydration, mild on examination with associated presyncope/lightheadedness. Patient is at high risk for worsening dehydration, electrolyte abnormalities, and complications.  Suspect at this time she has a viral gastritis which has been worsened by the fact that she has not been on her PPI medications.  However, at this time she does need further evaluation to rule out any other intra-abdominal pathology causing her symptoms..  I do believe that she needs lab work including CBC, BMP, urinalysis, and other investigational studies.  She does have significant dyspepsia at this time and has been unable to eat or drink for the past several days.  She has not taken her blood pressure medication this morning and her blood pressure is a good bit lower than usual. Long discussion with patient and her daughter-in-law today regarding her symptoms and recommended evaluation and treatment.  We did discuss that at this time I recommended she go to the emergency department via EMS for IV fluids, labs, and evaluation.  We discussed that she could most likely receive medication to help with her nausea and dyspepsia.  Patient was agreeable with this plan and reported that she just wanted to feel better.  She was transferred to the emergency department via EMS.  I called the emergency department and spoke with the attending physician and briefed him on her history and physical exam findings.  We will plan to call the emergency department to check on patient later this afternoon.  No Follow-up on file. Aliene Beams, MD 05/03/2017

## 2017-05-03 NOTE — ED Triage Notes (Signed)
Per Pt c/o decreased appetite times 2 days.  States she has had burning in her chest since yesterday.

## 2017-05-03 NOTE — Consult Note (Addendum)
[facility] Consult  Reason for Consult:SBO  Referring Physician:  Dr. Dayna Barker   Chief Complaint    Emesis      Erica [name] is a 84 y.o. female.  HPI: Erica [name] is a 84 yo with a prior history of SBO. She presented to her PCP after trying to go to the ED overnight with abdominal pain and nausea/vomiting. She has been having significant reflux and nausea, and was found to be quite dehydrated and distended at Dr. Roma Kayser office. She was seen in the Ed and found to have a SBO on CT scan, and labs that indicate dehydration. Her last BM was this AM per report.  She has been having pain for about 3 days per her report and it has not gotten any better. She is accompanied by her son and daughter in law.  Her last SBO was managed nonoperatively, but she reports having to have surgery for one of her SBO.  Past Medical History:  Diagnosis Date  . Anxiety   . Bipolar disorder ([place])   . Hyperlipidemia   . Hypothyroidism   . Irritable bowel syndrome   . RA (rheumatoid arthritis) ([place])     Past Surgical History:  Procedure Laterality Date  . ABDOMINAL HYSTERECTOMY    . Bone Spurs  08/07/13   Right Shoulder  . COLONOSCOPY    . COLONOSCOPY N/A 05/08/2012   Procedure: COLONOSCOPY;  Surgeon: Rogene Houston, MD;  [facility];  Service: Endoscopy;  Laterality: N/A;  730  . COLOSTOMY    . ESOPHAGOGASTRODUODENOSCOPY N/A 11/04/2013   Procedure: ESOPHAGOGASTRODUODENOSCOPY (EGD);  Surgeon: Rogene Houston, MD;  [facility];  Service: Endoscopy;  Laterality: N/A;  730  . EYE SURGERY  cataract x 2  . MALONEY DILATION N/A 11/04/2013   Procedure: Venia Minks DILATION;  Surgeon: Rogene Houston, MD;  [facility];  Service: Endoscopy;  Laterality: N/A;  . UPPER GASTROINTESTINAL ENDOSCOPY      Family History  Problem Relation Age of Onset  . Colon cancer Mother   . Bipolar disorder Mother   . Anxiety disorder Mother   . Atrial fibrillation Son   .  Depression Maternal Grandmother     Social History   Tobacco Use  . Smoking status: Former Smoker    Packs/day: 1.50    Years: 27.00    Pack years: 40.50    Types: Cigarettes    Last attempt to quit: 04/18/1981    Years since quitting: 36.0  . Smokeless tobacco: Former Systems developer    Quit date: 11/27/1980  Substance Use Topics  . Alcohol use: No    Alcohol/week: 0.0 oz  . Drug use: No    Medications: I have reviewed the patient's current medications. . Current Facility-Administered Medications  Medication Dose Route Frequency Provider Last Rate Last Dose  . buprenorphine (BUPRENEX) injection 0.15 mg  0.15 mg Intravenous Q4H PRN Isla Pence, MD       Current Outpatient Medications  Medication Sig Dispense Refill Last Dose  . acetaminophen (TYLENOL) 650 MG CR tablet Take 1,300 mg by mouth daily.   Taking  . amLODipine (NORVASC) 2.5 MG tablet Take 1 tablet (2.5 mg total) by mouth 2 (two) times daily. 60 tablet 6 05/02/2017 at Unknown time  . Cyanocobalamin (VITAMIN B-12 PO) Take by mouth daily.   05/02/2017 at Unknown time  . levothyroxine (SYNTHROID, LEVOTHROID) 75 MCG tablet Take 1 tablet (75 mcg total) by mouth daily. Please dispense brand  only thyroid medication/Synthroid. 30 tablet 2 05/03/2017 at Unknown time  . LORazepam (ATIVAN) 0.5 MG tablet Take 1/2 to 1 tablet by mouth twice a day as needed 60 tablet 1 Taking  . NON FORMULARY CBD OIL - 3 DROPS TOPICAL   Taking  . pantoprazole (PROTONIX) 40 MG tablet TAKE ONE TABLET BY MOUTH DAILY BEFORE BREAKFAST. 30 tablet 5 05/03/2017 at Unknown time  . selegiline (EMSAM) 6 MG/24HR Place 1 patch (6 mg total) onto the skin daily. 30 patch 12 05/02/2017 at Unknown time   SWH:[id number]  Allergies: Allergies  Allergen Reactions  . Penicillins Itching and Rash    ROS:  A comprehensive review of systems was negative except for: Gastrointestinal: positive for abdominal pain, nausea, reflux symptoms and vomiting  Blood pressure (!) 159/81,  pulse (!) 118, temperature 97.9 F (36.6 C), temperature source Oral, resp. rate 13, height 5' 2.5" (1.588 m), weight 121 lb (54.9 kg), SpO2 96 %. Physical Exam  Constitutional: She is oriented to person, place, and time and well-developed, well-nourished, and in no distress.  HENT:  Head: Normocephalic.  Eyes: Pupils are equal, round, and reactive to light.  Neck: Normal range of motion.  Cardiovascular: Regular rhythm.  Pulmonary/Chest: Effort normal.  Abdominal: Soft. She exhibits distension. There is no rebound and no guarding.  Generally tender but only to deep palpation  Musculoskeletal: Normal range of motion.  Neurological: She is alert and oriented to person, place, and time.  Skin: Skin is warm and dry.  Psychiatric: Mood, memory, affect and judgment normal.  Vitals reviewed.   Results: Results for orders placed or performed during the hospital encounter of 05/03/17 (from the past 48 hour(s))  Comprehensive metabolic panel     Status: Abnormal   Collection Time: 05/03/17  1:32 PM  Result Value Ref Range   Sodium 133 (L) 135 - 145 mmol/L   Potassium 3.8 3.5 - 5.1 mmol/L   Chloride 93 (L) 101 - 111 mmol/L   CO2 25 22 - 32 mmol/L   Glucose, Bld 166 (H) 65 - 99 mg/dL   BUN 29 (H) 6 - 20 mg/dL   Creatinine, Ser 1.51 (H) 0.44 - 1.00 mg/dL   Calcium 9.3 8.9 - 10.3 mg/dL   Total Protein 8.4 (H) 6.5 - 8.1 g/dL   Albumin 4.4 3.5 - 5.0 g/dL   AST 24 15 - 41 U/L   ALT 20 14 - 54 U/L   Alkaline Phosphatase 84 38 - 126 U/L   Total Bilirubin 0.7 0.3 - 1.2 mg/dL   GFR calc non Af Amer 31 (L) >60 mL/min   GFR calc Af Amer 36 (L) >60 mL/min    Comment: (NOTE) The eGFR has been calculated using the CKD EPI equation. This calculation has not been validated in all clinical situations. eGFR's persistently <60 mL/min signify possible Chronic Kidney Disease.    Anion gap 15 5 - 15    Comment: Performed at [facility], [address] [place], [place] [address]  Troponin I      Status: None   Collection Time: 05/03/17  1:32 PM  Result Value Ref Range   Troponin I <0.03 <0.03 ng/mL    Comment: Performed at [facility], [address], [place], [place] [address]  CBC with Differential     Status: Abnormal   Collection Time: 05/03/17  1:32 PM  Result Value Ref Range   WBC 17.9 (H) 4.0 - 10.5 K/uL   RBC 4.75 3.87 - 5.11 MIL/uL  Hemoglobin 15.0 12.0 - 15.0 g/dL   HCT 46.1 (H) 36.0 - 46.0 %   MCV 97.1 78.0 - 100.0 fL   MCH 31.6 26.0 - 34.0 pg   MCHC 32.5 30.0 - 36.0 g/dL   RDW 12.2 11.5 - 15.5 %   Platelets 204 150 - 400 K/uL   Neutrophils Relative % 85 %   Neutro Abs 15.1 (H) 1.7 - 7.7 K/uL   Lymphocytes Relative 4 %   Lymphs Abs 0.8 0.7 - 4.0 K/uL   Monocytes Relative 11 %   Monocytes Absolute 2.0 (H) 0.1 - 1.0 K/uL   Eosinophils Relative 0 %   Eosinophils Absolute 0.0 0.0 - 0.7 K/uL   Basophils Relative 0 %   Basophils Absolute 0.0 0.0 - 0.1 K/uL    Comment: Performed at [facility], [address] [place], [place] [address]  Lipase, blood     Status: None   Collection Time: 05/03/17  1:32 PM  Result Value Ref Range   Lipase 22 11 - 51 U/L    Comment: Performed at [facility], [address], [place], [place] [address]   Personally reviewed CT scan. Distal small bowel decompressed with transition from dilated to nondilated bowel in the RLQ, decompressed colon; duodenum does not extend midline what ever degree of malrotation present is not causing this obstruction and is upstream from the pathology / likely adhesions causing the obstruction; no free air or fluid, no thickening   Ct Abdomen Pelvis W Contrast  Result Date: 05/03/2017 CLINICAL DATA:  Nausea and vomiting with epigastric burning sensation. Duration: 1 day. EXAM: CT ABDOMEN AND PELVIS WITH CONTRAST TECHNIQUE: Multidetector CT imaging of the abdomen and pelvis was performed using the standard protocol following bolus administration of intravenous contrast. CONTRAST:  78m ISOVUE-300 IOPAMIDOL  (ISOVUE-300) INJECTION 61% COMPARISON:  03/17/2015 CT scan FINDINGS: Lower chest: Bandlike densities compatible with scarring or atelectasis in both lower lobes and in the lingula. Distal esophageal wall thickening with fluid-filled distal esophagus. Small type 1 hiatal hernia, distal esophagus and hernia with widely patent connection to the somewhat distended stomach. Circumflex coronary artery atherosclerotic calcification. Hepatobiliary: Stable 6 mm hypodense lesion in the dome of the right hepatic lobe on image 9/2. Stable similar lesion in the lateral segment left hepatic lobe, image 16/2. These lesions are too small to characterize but statistically likely to be benign. Small Phrygian cap along the gallbladder. Common bile duct 6 mm in diameter, within normal limits. Pancreas: Unremarkable Spleen: Unremarkable Adrenals/Urinary Tract: Adrenal glands normal. Bilateral renal peripelvic cysts. 2 mm left kidney lower pole nonobstructive renal calculus. Stomach/Bowel: Notably distended stomach with air-fluid level. The duodenum never crosses the midline, favoring small bowel malrotation. Dilated duodenum and loops of jejunum up to 4.4 cm with internal air-fluid levels, terminating in a somewhat angulated loop in the vicinity of image 60/2 distal to which the small bowel has a normal/decompressed caliber. Anastomotic staple line in the proximal sigmoid colon. Mild sigmoid colon diverticulosis. Vascular/Lymphatic: Aortoiliac atherosclerotic vascular disease. No pathologic adenopathy identified. Reproductive: Uterus absent.  Adnexa unremarkable. Other: No supplemental non-categorized findings. Musculoskeletal: Levoconvex lumbar scoliosis with rotary component. IMPRESSION: 1. Small bowel obstruction with a angulated loop in the upper pelvis just to the right of midline representing the transition between dilated and nondilated bowel, probably due to an adhesion. Distended small bowel loops with air-fluid levels;  distended stomach with an air-fluid level. 2. Small hiatal hernia. 3. Fluid-filled thick-walled distal esophagus favoring esophagitis. 4. Other imaging findings of potential  clinical significance: Aortic Atherosclerosis (ICD10-I70.0). Coronary atherosclerosis. 2 mm left kidney lower pole nonobstructive renal calculus. Levoconvex lumbar scoliosis with rotary component. Electronically Signed   By: Van Clines M.D.   On: 05/03/2017 15:39     Assessment & Plan:  SHARRY BEINING is a 84 y.o. female with SBO who has had prior SBO managed both operatively and nonoperatively. She does have a leukocytosis but some of this could be explained by her dehydration.  -NPO, NG in place, keep to suction  -IVF for resuscitation  -Labs in the AM, discussed with patient and family that if the WBC continues to go up without another source identified that this could indicate some bowel compromise and may require surgical intervention  -If WBC going down, pain improving, will plan for NG trial and SBFT with gastrografin at 48 hrs (Friday AM)   All questions were answered to the satisfaction of the patient and family.    Erica [name] 05/03/2017, 5:46 PM

## 2017-05-03 NOTE — Progress Notes (Signed)
Pharmacy Antibiotic Note  Erica [name] is a 84 y.o. female admitted on 05/03/2017 with UTI.  Pharmacy has been consulted for Cipro dosing.  Plan: Cipro 400mg  IV every 24 hours. Monitor labs, micro and vitals.   Height: 5' 2.5" (158.8 cm) Weight: 124 lb 12.5 oz (56.6 kg) IBW/kg (Calculated) : 51.25  Temp (24hrs), Avg:98.1 F (36.7 C), Min:97.9 F (36.6 C), Max:98.4 F (36.9 C)  Recent Labs  Lab 05/03/17 1332  WBC 17.9*  CREATININE 1.51*    Estimated Creatinine Clearance: 23.3 mL/min (A) (by C-G formula based on SCr of 1.51 mg/dL (H)).    Allergies  Allergen Reactions  . Penicillins Itching and Rash   Antimicrobials this admission: Cipro 2/20 >>    >>   Dose adjustments this admission: Reduced Cipro dose for current renal function  Microbiology results:  BCx:   UCx:    Sputum:    MRSA PCR:   Thank you for allowing pharmacy to be a part of this patient's care.  3/20 05/03/2017 6:52 PM

## 2017-05-04 DIAGNOSIS — E860 Dehydration: Secondary | ICD-10-CM

## 2017-05-04 DIAGNOSIS — N179 Acute kidney failure, unspecified: Secondary | ICD-10-CM | POA: Diagnosis present

## 2017-05-04 LAB — CBC
HEMATOCRIT: 41.9 % (ref 36.0–46.0)
Hemoglobin: 13.2 g/dL (ref 12.0–15.0)
MCH: 31.3 pg (ref 26.0–34.0)
MCHC: 31.5 g/dL (ref 30.0–36.0)
MCV: 99.3 fL (ref 78.0–100.0)
Platelets: 184 10*3/uL (ref 150–400)
RBC: 4.22 MIL/uL (ref 3.87–5.11)
RDW: 12.4 % (ref 11.5–15.5)
WBC: 11 10*3/uL — ABNORMAL HIGH (ref 4.0–10.5)

## 2017-05-04 LAB — COMPREHENSIVE METABOLIC PANEL
ALK PHOS: 63 U/L (ref 38–126)
ALT: 15 U/L (ref 14–54)
AST: 19 U/L (ref 15–41)
Albumin: 3.4 g/dL — ABNORMAL LOW (ref 3.5–5.0)
Anion gap: 9 (ref 5–15)
BILIRUBIN TOTAL: 0.5 mg/dL (ref 0.3–1.2)
BUN: 14 mg/dL (ref 6–20)
CALCIUM: 8.2 mg/dL — AB (ref 8.9–10.3)
CO2: 26 mmol/L (ref 22–32)
Chloride: 104 mmol/L (ref 101–111)
Creatinine, Ser: 0.63 mg/dL (ref 0.44–1.00)
GFR calc Af Amer: >60 mL/min (ref >60)
Glucose, Bld: 120 mg/dL — ABNORMAL HIGH (ref 65–99)
POTASSIUM: 3.4 mmol/L — AB (ref 3.5–5.1)
Sodium: 139 mmol/L (ref 135–145)
TOTAL PROTEIN: 6.5 g/dL (ref 6.5–8.1)

## 2017-05-04 LAB — TROPONIN I: Troponin I: <0.03 ng/mL (ref <0.03)

## 2017-05-04 LAB — MAGNESIUM: MAGNESIUM: 2.1 mg/dL (ref 1.7–2.4)

## 2017-05-04 LAB — PHOSPHORUS: PHOSPHORUS: 3.7 mg/dL (ref 2.5–4.6)

## 2017-05-04 MED ORDER — POTASSIUM CHLORIDE IN NACL 20-0.9 MEQ/L-% IV SOLN
INTRAVENOUS | Status: DC
  Administered 2017-05-04 – 2017-05-06 (×4): via INTRAVENOUS

## 2017-05-04 MED ORDER — SELEGILINE 9 MG/24HR TD PT24
9.0000 mg | MEDICATED_PATCH | Freq: Every day | TRANSDERMAL | Status: DC
  Administered 2017-05-04 – 2017-05-08 (×5): 9 mg via TRANSDERMAL

## 2017-05-04 MED ORDER — SELEGILINE 9 MG/24HR TD PT24: 9.0000 mg | MEDICATED_PATCH | Freq: Every day | TRANSDERMAL | Status: DC

## 2017-05-04 MED ORDER — CIPROFLOXACIN IN D5W 400 MG/200ML IV SOLN
400.0000 mg | Freq: Two times a day (BID) | INTRAVENOUS | Status: DC
Start: 2017-05-04 — End: 2017-05-05
  Administered 2017-05-04 – 2017-05-05 (×3): 400 mg via INTRAVENOUS
  Filled 2017-05-04 (×3): qty 200

## 2017-05-04 MED ORDER — POTASSIUM CHLORIDE 10 MEQ/100ML IV SOLN
10.0000 meq | INTRAVENOUS | Status: AC
  Administered 2017-05-04 (×2): 10 meq via INTRAVENOUS
  Filled 2017-05-04 (×2): qty 100

## 2017-05-04 MED ORDER — SODIUM CHLORIDE 0.9 % IV SOLN: INTRAVENOUS | Status: DC

## 2017-05-04 MED ORDER — PHENOL 1.4 % MT LIQD
1.0000 | OROMUCOSAL | Status: DC | PRN
  Administered 2017-05-04 – 2017-05-05 (×2): 1 via OROMUCOSAL
  Filled 2017-05-04: qty 177

## 2017-05-04 MED ORDER — HEPARIN SODIUM (PORCINE) 5000 UNIT/ML IJ SOLN
5000.0000 [IU] | Freq: Three times a day (TID) | INTRAMUSCULAR | Status: DC
  Administered 2017-05-04 – 2017-05-06 (×4): 5000 [IU] via SUBCUTANEOUS
  Filled 2017-05-04 (×5): qty 1

## 2017-05-04 NOTE — Progress Notes (Signed)
NG tube continues to work properly while on intermittent suction. Patient states there is minimal amount of discomfort but does want to inquire about her throat becoming sore. Note left for MD to assess. Pain medication offered at intervals during the night and patient has verbally declined. She has been resting well during rounds.

## 2017-05-04 NOTE — Plan of Care (Signed)
  Education: Knowledge of General Education information will improve 05/04/2017 0414 - Progressing by Wynne Dust, RN   Clinical Measurements: Ability to maintain clinical measurements within normal limits will improve 05/04/2017 0414 - Progressing by Wynne Dust, RN Will remain free from infection 05/04/2017 0414 - Progressing by Wynne Dust, RN Diagnostic test results will improve 05/04/2017 0414 - Progressing by Wynne Dust, RN Respiratory complications will improve 05/04/2017 0414 - Progressing by Wynne Dust, RN   Clinical Measurements: Will remain free from infection 05/04/2017 0414 - Progressing by Wynne Dust, RN   Clinical Measurements: Diagnostic test results will improve 05/04/2017 0414 - Progressing by Wynne Dust, RN   Clinical Measurements: Respiratory complications will improve 05/04/2017 0414 - Progressing by Wynne Dust, RN   Activity: Risk for activity intolerance will decrease 05/04/2017 0414 - Progressing by Wynne Dust, RN   Coping: Level of anxiety will decrease 05/04/2017 0414 - Progressing by Wynne Dust, RN   Pain Managment: General experience of comfort will improve 05/04/2017 0414 - Progressing by Wynne Dust, RN   Safety: Ability to remain free from injury will improve 05/04/2017 0415 - Progressing by Wynne Dust, RN   Skin Integrity: Risk for impaired skin integrity will decrease 05/04/2017 0415 - Progressing by Wynne Dust, RN   Skin Integrity: Risk for impaired skin integrity will decrease 05/04/2017 0415 - Progressing by Wynne Dust, RN

## 2017-05-04 NOTE — Plan of Care (Signed)
  Education: Knowledge of General Education information will improve 05/04/2017 0415 - Progressing by Wynne Dust, RN 05/04/2017 0414 - Progressing by Wynne Dust, RN   Health Behavior/Discharge Planning: Ability to manage health-related needs will improve 05/04/2017 0415 - Progressing by Wynne Dust, RN   Clinical Measurements: Ability to maintain clinical measurements within normal limits will improve 05/04/2017 0505 - Progressing by Wynne Dust, RN 05/04/2017 0415 - Progressing by Wynne Dust, RN 05/04/2017 0414 - Progressing by Wynne Dust, RN Will remain free from infection 05/04/2017 0415 - Progressing by Wynne Dust, RN 05/04/2017 0414 - Progressing by Wynne Dust, RN Diagnostic test results will improve 05/04/2017 0415 - Progressing by Wynne Dust, RN 05/04/2017 0414 - Progressing by Wynne Dust, RN Respiratory complications will improve 05/04/2017 0415 - Progressing by Wynne Dust, RN 05/04/2017 0414 - Progressing by Wynne Dust, RN Cardiovascular complication will be avoided 05/04/2017 0415 - Progressing by Wynne Dust, RN   Clinical Measurements: Will remain free from infection 05/04/2017 0415 - Progressing by Wynne Dust, RN 05/04/2017 0414 - Progressing by Wynne Dust, RN   Activity: Risk for activity intolerance will decrease 05/04/2017 0505 - Progressing by Wynne Dust, RN 05/04/2017 0415 - Progressing by Wynne Dust, RN 05/04/2017 0414 - Progressing by Wynne Dust, RN   Nutrition: Adequate nutrition will be maintained 05/04/2017 0505 - Progressing by Wynne Dust, RN 05/04/2017 0415 - Progressing by Wynne Dust, RN   Coping: Level of anxiety will decrease 05/04/2017 0505 - Progressing by Wynne Dust, RN 05/04/2017 0415 - Progressing by Wynne Dust, RN 05/04/2017 0414 - Progressing by Wynne Dust, RN   Elimination: Will not experience complications related to bowel motility 05/04/2017  0415 - Progressing by Wynne Dust, RN Will not experience complications related to urinary retention 05/04/2017 0415 - Progressing by Wynne Dust, RN   Pain Managment: General experience of comfort will improve 05/04/2017 0415 - Progressing by Wynne Dust, RN 05/04/2017 0414 - Progressing by Wynne Dust, RN   Safety: Ability to remain free from injury will improve 05/04/2017 0505 - Progressing by Wynne Dust, RN 05/04/2017 0415 - Progressing by Wynne Dust, RN

## 2017-05-04 NOTE — Progress Notes (Signed)
PROGRESS NOTE    Erica [name]  YDX:[id number] DOB: 07/10/1932 DOA: 05/03/2017 PCP: Aliene Beams, MD   Brief Narrative:  84 year old female with a history of hyperlipidemia, hypothyroidism, hypertension, previous colostomy with history of bowel obstruction in the past, admitted to the hospital with nausea, vomiting related to small bowel obstruction.  General surgery following.  NG tube placed for decompression.  Treating supportively at this time.   Assessment & Plan:   Principal Problem:   Small bowel obstruction (HCC) Active Problems:   Tachycardia   Hyponatremia   AKI (acute kidney injury) (HCC)   1. Small bowel obstruction.  Currently has NG tube in place for decompression.  Continue n.p.o for bowel rest.  General surgery following.  Continue supportive management 2. Acute kidney injury.  Related to dehydration from nausea and vomiting.  Improving with IV fluids. 3. Possible urinary tract infection.  Currently on intravenous ciprofloxacin.  Urine culture ordered. 4. Hypertension.  Amlodipine on hold until she is able to take medications by mouth.  Continue metoprolol IV. 5. Hypothyroidism.  On levothyroxine 6. Hyponatremia.  Related to early depletion.  Improving with hydration.   DVT prophylaxis: Heparin Code Status: Full code Family Communication: Discussed with family at the bedside Disposition Plan: Discharge home once improved   Consultants:   General surgery  Procedures:     Antimicrobials:   Cipro 2/20 >   Subjective: Feeling better today.  Complains of sore throat.  No further nausea and vomiting since NG tube was placed.  She is passing gas.  No bowel movements.  Objective: Vitals:   05/03/17 1759 05/03/17 2350 05/04/17 0445 05/04/17 1500  BP: (!) 158/79 (!) 152/74 127/63 137/60  Pulse: (!) 128  (!) 113 96  Resp: 16   16  Temp: 98.1 F (36.7 C)   98.2 F (36.8 C)  TempSrc: Oral   Oral  SpO2: 96%   94%  Weight: 56.6 kg (124 lb 12.5 oz)       Height: 5' 2.5" (1.588 m)       Intake/Output Summary (Last 24 hours) at 05/04/2017 1726 Last data filed at 05/04/2017 0600 Gross per 24 hour  Intake 981.25 ml  Output 2000 ml  Net -1018.75 ml   Filed Weights   05/03/17 1310 05/03/17 1759  Weight: 54.9 kg (121 lb) 56.6 kg (124 lb 12.5 oz)    Examination:  General exam: Appears calm and comfortable  Respiratory system: Clear to auscultation. Respiratory effort normal. Cardiovascular system: S1 & S2 heard, RRR. No JVD, murmurs, rubs, gallops or clicks. No pedal edema. Gastrointestinal system: Abdomen is mildly distended, soft and nontender. No organomegaly or masses felt. Bowel sounds sluggish. Central nervous system: Alert and oriented. No focal neurological deficits. Extremities: Symmetric 5 x 5 power. Skin: No rashes, lesions or ulcers Psychiatry: Judgement and insight appear normal. Mood & affect appropriate.     Data Reviewed: I have personally reviewed following labs and imaging studies  CBC: Recent Labs  Lab 05/03/17 1332 05/04/17 0645  WBC 17.9* 11.0*  NEUTROABS 15.1*  --   HGB 15.0 13.2  HCT 46.1* 41.9  MCV 97.1 99.3  PLT 204 184   Basic Metabolic Panel: Recent Labs  Lab 05/03/17 1332 05/04/17 0645  NA 133* 139  K 3.8 3.4*  CL 93* 104  CO2 25 26  GLUCOSE 166* 120*  BUN 29* 14  CREATININE 1.51* 0.63  CALCIUM 9.3 8.2*  MG  --  2.1  PHOS  --  3.7  GFR: Estimated Creatinine Clearance: 43.9 mL/min (by C-G formula based on SCr of 0.63 mg/dL). Liver Function Tests: Recent Labs  Lab 05/03/17 1332 05/04/17 0645  AST 24 19  ALT 20 15  ALKPHOS 84 63  BILITOT 0.7 0.5  PROT 8.4* 6.5  ALBUMIN 4.4 3.4*   Recent Labs  Lab 05/03/17 1332  LIPASE 22   No results for input(s): AMMONIA in the last 168 hours. Coagulation Profile: No results for input(s): INR, PROTIME in the last 168 hours. Cardiac Enzymes: Recent Labs  Lab 05/03/17 1332 05/03/17 1810 05/03/17 2331 05/04/17 0645  TROPONINI <0.03  <0.03 <0.03 <0.03   BNP (last 3 results) No results for input(s): PROBNP in the last 8760 hours. HbA1C: No results for input(s): HGBA1C in the last 72 hours. CBG: No results for input(s): GLUCAP in the last 168 hours. Lipid Profile: No results for input(s): CHOL, HDL, LDLCALC, TRIG, CHOLHDL, LDLDIRECT in the last 72 hours. Thyroid Function Tests: No results for input(s): TSH, T4TOTAL, FREET4, T3FREE, THYROIDAB in the last 72 hours. Anemia Panel: No results for input(s): VITAMINB12, FOLATE, FERRITIN, TIBC, IRON, RETICCTPCT in the last 72 hours. Sepsis Labs: No results for input(s): PROCALCITON, LATICACIDVEN in the last 168 hours.  No results found for this or any previous visit (from the past 240 hour(s)).       Radiology Studies: Ct Abdomen Pelvis W Contrast  Result Date: 05/03/2017 CLINICAL DATA:  Nausea and vomiting with epigastric burning sensation. Duration: 1 day. EXAM: CT ABDOMEN AND PELVIS WITH CONTRAST TECHNIQUE: Multidetector CT imaging of the abdomen and pelvis was performed using the standard protocol following bolus administration of intravenous contrast. CONTRAST:  85mL ISOVUE-300 IOPAMIDOL (ISOVUE-300) INJECTION 61% COMPARISON:  03/17/2015 CT scan FINDINGS: Lower chest: Bandlike densities compatible with scarring or atelectasis in both lower lobes and in the lingula. Distal esophageal wall thickening with fluid-filled distal esophagus. Small type 1 hiatal hernia, distal esophagus and hernia with widely patent connection to the somewhat distended stomach. Circumflex coronary artery atherosclerotic calcification. Hepatobiliary: Stable 6 mm hypodense lesion in the dome of the right hepatic lobe on image 9/2. Stable similar lesion in the lateral segment left hepatic lobe, image 16/2. These lesions are too small to characterize but statistically likely to be benign. Small Phrygian cap along the gallbladder. Common bile duct 6 mm in diameter, within normal limits. Pancreas:  Unremarkable Spleen: Unremarkable Adrenals/Urinary Tract: Adrenal glands normal. Bilateral renal peripelvic cysts. 2 mm left kidney lower pole nonobstructive renal calculus. Stomach/Bowel: Notably distended stomach with air-fluid level. The duodenum never crosses the midline, favoring small bowel malrotation. Dilated duodenum and loops of jejunum up to 4.4 cm with internal air-fluid levels, terminating in a somewhat angulated loop in the vicinity of image 60/2 distal to which the small bowel has a normal/decompressed caliber. Anastomotic staple line in the proximal sigmoid colon. Mild sigmoid colon diverticulosis. Vascular/Lymphatic: Aortoiliac atherosclerotic vascular disease. No pathologic adenopathy identified. Reproductive: Uterus absent.  Adnexa unremarkable. Other: No supplemental non-categorized findings. Musculoskeletal: Levoconvex lumbar scoliosis with rotary component. IMPRESSION: 1. Small bowel obstruction with a angulated loop in the upper pelvis just to the right of midline representing the transition between dilated and nondilated bowel, probably due to an adhesion. Distended small bowel loops with air-fluid levels; distended stomach with an air-fluid level. 2. Small hiatal hernia. 3. Fluid-filled thick-walled distal esophagus favoring esophagitis. 4. Other imaging findings of potential clinical significance: Aortic Atherosclerosis (ICD10-I70.0). Coronary atherosclerosis. 2 mm left kidney lower pole nonobstructive renal calculus. Levoconvex lumbar scoliosis with rotary  component. Electronically Signed   By: Gaylyn Rong M.D.   On: 05/03/2017 15:39   Dg Chest Portable 1 View  Result Date: 05/03/2017 CLINICAL DATA:  Nasogastric tube placement. EXAM: PORTABLE CHEST 1 VIEW COMPARISON:  Radiographs of April 07, 2017. FINDINGS: The heart size and mediastinal contours are within normal limits. No pneumothorax or pleural effusion is noted. Right lung is clear. Mild left basilar subsegmental  atelectasis is noted. Nasogastric tube tips is seen in expected position of proximal stomach. The visualized skeletal structures are unremarkable. IMPRESSION: Nasogastric tube tip seen in expected position of proximal stomach. Mild left basilar subsegmental atelectasis. Electronically Signed   By: Lupita Raider, M.D.   On: 05/03/2017 17:50        Scheduled Meds: . heparin injection (subcutaneous)  5,000 Units Subcutaneous Q8H  . levothyroxine  75 mcg Intravenous Daily  . metoprolol tartrate  2.5 mg Intravenous Q6H  . pantoprazole (PROTONIX) IV  40 mg Intravenous Q24H  . selegiline  9 mg Transdermal Daily   Continuous Infusions: . 0.9 % NaCl with KCl 20 mEq / L 75 mL/hr at 05/04/17 1111  . ciprofloxacin       LOS: 1 day    Time spent:     Erick Blinks, MD [facility] Pager [phone]  If 7PM-7AM, please contact night-coverage [email or web address] Password [facility] 05/04/2017, 5:26 PM

## 2017-05-04 NOTE — Progress Notes (Signed)
[facility] Progress Note     Subjective: No abdominal pain. Just sore throat that was present prior to NG tube placement. No flatus. NG with 2L output.   Objective: Vital signs in last 24 hours: Temp:  [97.9 F (36.6 C)-98.4 F (36.9 C)] 98.1 F (36.7 C) (02/20 1759) Pulse Rate:  [82-128] 113 (02/21 0445) Resp:  [13-20] 16 (02/20 1759) BP: (127-170)/(63-98) 127/63 (02/21 0445) SpO2:  [92 %-97 %] 96 % (02/20 1759) Weight:  [121 lb (54.9 kg)-124 lb 12.5 oz (56.6 kg)] 124 lb 12.5 oz (56.6 kg) (02/20 1759) Last BM Date: 05/03/17  Intake/Output from previous day: 02/20 0701 - 02/21 0700 In: 3031.3 [I.V.:781.3; IV Piggyback:2250] Out: 2000 [Emesis/NG output:2000] Intake/Output this shift: No intake/output data recorded.  General appearance: alert, cooperative and no distress Resp: normal work breathing GI: soft, distended, tympanic, non tender, no rebound or guarding  Lab Results:  Recent Labs    05/03/17 1332 05/04/17 0645  WBC 17.9* 11.0*  HGB 15.0 13.2  HCT 46.1* 41.9  PLT 204 184   BMET Recent Labs    05/03/17 1332 05/04/17 0645  NA 133* 139  K 3.8 3.4*  CL 93* 104  CO2 25 26  GLUCOSE 166* 120*  BUN 29* 14  CREATININE 1.51* 0.63  CALCIUM 9.3 8.2*   UA cloudy with LE and bacteria- getting treatment for UTI with ciprofloxacin.   PT/INR No results for input(s): LABPROT, INR in the last 72 hours.  Studies/Results: Ct Abdomen Pelvis W Contrast  Result Date: 05/03/2017 CLINICAL DATA:  Nausea and vomiting with epigastric burning sensation. Duration: 1 day. EXAM: CT ABDOMEN AND PELVIS WITH CONTRAST TECHNIQUE: Multidetector CT imaging of the abdomen and pelvis was performed using the standard protocol following bolus administration of intravenous contrast. CONTRAST:  28mL ISOVUE-300 IOPAMIDOL (ISOVUE-300) INJECTION 61% COMPARISON:  03/17/2015 CT scan FINDINGS: Lower chest: Bandlike densities compatible with scarring or atelectasis in both lower  lobes and in the lingula. Distal esophageal wall thickening with fluid-filled distal esophagus. Small type 1 hiatal hernia, distal esophagus and hernia with widely patent connection to the somewhat distended stomach. Circumflex coronary artery atherosclerotic calcification. Hepatobiliary: Stable 6 mm hypodense lesion in the dome of the right hepatic lobe on image 9/2. Stable similar lesion in the lateral segment left hepatic lobe, image 16/2. These lesions are too small to characterize but statistically likely to be benign. Small Phrygian cap along the gallbladder. Common bile duct 6 mm in diameter, within normal limits. Pancreas: Unremarkable Spleen: Unremarkable Adrenals/Urinary Tract: Adrenal glands normal. Bilateral renal peripelvic cysts. 2 mm left kidney lower pole nonobstructive renal calculus. Stomach/Bowel: Notably distended stomach with air-fluid level. The duodenum never crosses the midline, favoring small bowel malrotation. Dilated duodenum and loops of jejunum up to 4.4 cm with internal air-fluid levels, terminating in a somewhat angulated loop in the vicinity of image 60/2 distal to which the small bowel has a normal/decompressed caliber. Anastomotic staple line in the proximal sigmoid colon. Mild sigmoid colon diverticulosis. Vascular/Lymphatic: Aortoiliac atherosclerotic vascular disease. No pathologic adenopathy identified. Reproductive: Uterus absent.  Adnexa unremarkable. Other: No supplemental non-categorized findings. Musculoskeletal: Levoconvex lumbar scoliosis with rotary component. IMPRESSION: 1. Small bowel obstruction with a angulated loop in the upper pelvis just to the right of midline representing the transition between dilated and nondilated bowel, probably due to an adhesion. Distended small bowel loops with air-fluid levels; distended stomach with an air-fluid level. 2. Small hiatal hernia. 3. Fluid-filled thick-walled distal esophagus favoring esophagitis. 4. Other imaging findings  of potential clinical significance: Aortic Atherosclerosis (ICD10-I70.0). Coronary atherosclerosis. 2 mm left kidney lower pole nonobstructive renal calculus. Levoconvex lumbar scoliosis with rotary component. Electronically Signed   By: Gaylyn Rong M.D.   On: 05/03/2017 15:39   Dg Chest Portable 1 View  Result Date: 05/03/2017 CLINICAL DATA:  Nasogastric tube placement. EXAM: PORTABLE CHEST 1 VIEW COMPARISON:  Radiographs of April 07, 2017. FINDINGS: The heart size and mediastinal contours are within normal limits. No pneumothorax or pleural effusion is noted. Right lung is clear. Mild left basilar subsegmental atelectasis is noted. Nasogastric tube tips is seen in expected position of proximal stomach. The visualized skeletal structures are unremarkable. IMPRESSION: Nasogastric tube tip seen in expected position of proximal stomach. Mild left basilar subsegmental atelectasis. Electronically Signed   By: Lupita Raider, M.D.   On: 05/03/2017 17:50    Anti-infectives: Anti-infectives (From admission, onward)   Start     Dose/Rate Route Frequency Ordered Stop   05/03/17 1930  ciprofloxacin (CIPRO) IVPB 400 mg     400 mg 200 mL/hr over 60 Minutes Intravenous Every 24 hours 05/03/17 1856        Assessment/Plan: Erica [name] is a 84 yo with SBO and UTI. WBC down with hydration and with cipro for the UTI. Cr also much improved from 1.51 to 0.63. -NPO, NG in place -Awaiting bowel function return -Will have to decide if want to give it a few more days to rest prior to SBFT, likely will just hold off on this until Monday  -Replace Lytes with K to 4, Phos to 3, and Mg to 2, gave her some potassium today, would chance to fluids with potassium  -WBC down and Cr down, some degree of WBC from UTI and rest from dehydration    LOS: 1 day    Lucretia Roers 05/04/2017

## 2017-05-05 LAB — URINE CULTURE

## 2017-05-05 LAB — BASIC METABOLIC PANEL
Anion gap: 10 (ref 5–15)
BUN: 10 mg/dL (ref 6–20)
CHLORIDE: 106 mmol/L (ref 101–111)
CO2: 22 mmol/L (ref 22–32)
Calcium: 8.1 mg/dL — ABNORMAL LOW (ref 8.9–10.3)
Creatinine, Ser: 0.61 mg/dL (ref 0.44–1.00)
GFR calc Af Amer: >60 mL/min (ref >60)
GFR calc non Af Amer: >60 mL/min (ref >60)
GLUCOSE: 92 mg/dL (ref 65–99)
POTASSIUM: 3.7 mmol/L (ref 3.5–5.1)
Sodium: 138 mmol/L (ref 135–145)

## 2017-05-05 LAB — CBC
HCT: 38 % (ref 36.0–46.0)
Hemoglobin: 11.8 g/dL — ABNORMAL LOW (ref 12.0–15.0)
MCH: 31.1 pg (ref 26.0–34.0)
MCHC: 31.1 g/dL (ref 30.0–36.0)
MCV: 100.3 fL — AB (ref 78.0–100.0)
Platelets: 170 10*3/uL (ref 150–400)
RBC: 3.79 MIL/uL — AB (ref 3.87–5.11)
RDW: 11.9 % (ref 11.5–15.5)
WBC: 7.8 10*3/uL (ref 4.0–10.5)

## 2017-05-05 LAB — PHOSPHORUS: Phosphorus: 1.9 mg/dL — ABNORMAL LOW (ref 2.5–4.6)

## 2017-05-05 LAB — MAGNESIUM: Magnesium: 1.7 mg/dL (ref 1.7–2.4)

## 2017-05-05 MED ORDER — KETOROLAC TROMETHAMINE 15 MG/ML IJ SOLN
15.0000 mg | Freq: Four times a day (QID) | INTRAMUSCULAR | Status: DC | PRN
  Administered 2017-05-07 – 2017-05-09 (×7): 15 mg via INTRAVENOUS
  Filled 2017-05-05 (×7): qty 1

## 2017-05-05 MED ORDER — MAGNESIUM SULFATE 2 GM/50ML IV SOLN
2.0000 g | Freq: Once | INTRAVENOUS | Status: AC
  Administered 2017-05-05: 2 g via INTRAVENOUS
  Filled 2017-05-05: qty 50

## 2017-05-05 MED ORDER — POTASSIUM PHOSPHATES 15 MMOLE/5ML IV SOLN
40.0000 meq | Freq: Once | INTRAVENOUS | Status: AC
  Administered 2017-05-05: 40 meq via INTRAVENOUS
  Filled 2017-05-05: qty 9.09

## 2017-05-05 NOTE — Progress Notes (Signed)
PROGRESS NOTE    Erica [name]  ZDG:[id number] DOB: 1932-09-20 DOA: 05/03/2017 PCP: Aliene Beams, MD   Brief Narrative:  84 year old female with a history of hyperlipidemia, hypothyroidism, hypertension, previous colostomy with history of bowel obstruction in the past, admitted to the hospital with nausea, vomiting related to small bowel obstruction.  General surgery following.  NG tube placed for decompression.  Treating supportively at this time.   Assessment & Plan:   Principal Problem:   Small bowel obstruction (HCC) Active Problems:   Tachycardia   Hyponatremia   AKI (acute kidney injury) (HCC)   1. Small bowel obstruction.  Currently has NG tube in place for decompression.  Continue n.p.o for bowel rest.  General surgery following.  NG tube placed to gravity today.  Continue supportive management 2. Acute kidney injury.  Related to dehydration from nausea and vomiting.  Improving with IV fluids. 3. Possible urinary tract infection.  Treated with intravenous ciprofloxacin.  Urine culture shows nonspecific growth.  We will discontinue further antibiotics. 4. Hypertension.  Amlodipine on hold until she is able to take medications by mouth.  Continue metoprolol IV. 5. Hypothyroidism.  On levothyroxine 6. Hyponatremia.  Related to volume depletion.  Improving with hydration.   DVT prophylaxis: Heparin Code Status: Full code Family Communication: Discussed with family at the bedside Disposition Plan: Discharge home once improved   Consultants:   General surgery  Procedures:     Antimicrobials:   Cipro 2/20 > 2/22   Subjective: No vomiting.  She is passing gas, no bowel movements yet.  Continues to complain of sore throat  Objective: Vitals:   05/04/17 1500 05/04/17 2100 05/05/17 0520 05/05/17 1500  BP: 137/60 139/64 (!) 152/77 (!) 147/75  Pulse: 96 (!) 107 96 92  Resp: 16 16 16 17   Temp: 98.2 F (36.8 C) 98.3 F (36.8 C) 98.3 F (36.8 C) 98.7 F (37.1  C)  TempSrc: Oral Oral Oral Oral  SpO2: 94% 93% 95% 98%  Weight:      Height:        Intake/Output Summary (Last 24 hours) at 05/05/2017 1740 Last data filed at 05/05/2017 0751 Gross per 24 hour  Intake 300 ml  Output 400 ml  Net -100 ml   Filed Weights   05/03/17 1310 05/03/17 1759  Weight: 54.9 kg (121 lb) 56.6 kg (124 lb 12.5 oz)    Examination:  General exam: Alert, awake, oriented x 3 Respiratory system: Clear to auscultation. Respiratory effort normal. Cardiovascular system:RRR. No murmurs, rubs, gallops. Gastrointestinal system: Abdomen is nondistended, soft and nontender. No organomegaly or masses felt. Normal bowel sounds sluggish. Central nervous system: Alert and oriented. No focal neurological deficits. Extremities: No C/C/E, +pedal pulses Skin: No rashes, lesions or ulcers Psychiatry: Judgement and insight appear normal. Mood & affect appropriate.       Data Reviewed: I have personally reviewed following labs and imaging studies  CBC: Recent Labs  Lab 05/03/17 1332 05/04/17 0645 05/05/17 0525  WBC 17.9* 11.0* 7.8  NEUTROABS 15.1*  --   --   HGB 15.0 13.2 11.8*  HCT 46.1* 41.9 38.0  MCV 97.1 99.3 100.3*  PLT 204 184 170   Basic Metabolic Panel: Recent Labs  Lab 05/03/17 1332 05/04/17 0645 05/05/17 0525  NA 133* 139 138  K 3.8 3.4* 3.7  CL 93* 104 106  CO2 25 26 22   GLUCOSE 166* 120* 92  BUN 29* 14 10  CREATININE 1.51* 0.63 0.61  CALCIUM 9.3 8.2* 8.1*  MG  --  2.1 1.7  PHOS  --  3.7 1.9*   GFR: Estimated Creatinine Clearance: 43.9 mL/min (by C-G formula based on SCr of 0.61 mg/dL). Liver Function Tests: Recent Labs  Lab 05/03/17 1332 05/04/17 0645  AST 24 19  ALT 20 15  ALKPHOS 84 63  BILITOT 0.7 0.5  PROT 8.4* 6.5  ALBUMIN 4.4 3.4*   Recent Labs  Lab 05/03/17 1332  LIPASE 22   No results for input(s): AMMONIA in the last 168 hours. Coagulation Profile: No results for input(s): INR, PROTIME in the last 168 hours. Cardiac  Enzymes: Recent Labs  Lab 05/03/17 1332 05/03/17 1810 05/03/17 2331 05/04/17 0645  TROPONINI <0.03 <0.03 <0.03 <0.03   BNP (last 3 results) No results for input(s): PROBNP in the last 8760 hours. HbA1C: No results for input(s): HGBA1C in the last 72 hours. CBG: No results for input(s): GLUCAP in the last 168 hours. Lipid Profile: No results for input(s): CHOL, HDL, LDLCALC, TRIG, CHOLHDL, LDLDIRECT in the last 72 hours. Thyroid Function Tests: No results for input(s): TSH, T4TOTAL, FREET4, T3FREE, THYROIDAB in the last 72 hours. Anemia Panel: No results for input(s): VITAMINB12, FOLATE, FERRITIN, TIBC, IRON, RETICCTPCT in the last 72 hours. Sepsis Labs: No results for input(s): PROCALCITON, LATICACIDVEN in the last 168 hours.  Recent Results (from the past 240 hour(s))  Culture, Urine     Status: Abnormal   Collection Time: 05/03/17  5:24 PM  Result Value Ref Range Status   Specimen Description   Final    URINE, CLEAN CATCH Performed at [facility], [address], [place], [place] [address]    Special Requests   Final    NONE Performed at [facility], [address], [place], [place] [address]    Culture MULTIPLE SPECIES PRESENT, SUGGEST RECOLLECTION (A)  Final   Report Status 05/05/2017 FINAL  Final         Radiology Studies: No results found.      Scheduled Meds: . heparin injection (subcutaneous)  5,000 Units Subcutaneous Q8H  . levothyroxine  75 mcg Intravenous Daily  . metoprolol tartrate  2.5 mg Intravenous Q6H  . pantoprazole (PROTONIX) IV  40 mg Intravenous Q24H  . selegiline  9 mg Transdermal Daily   Continuous Infusions: . 0.9 % NaCl with KCl 20 mEq / L Stopped (05/05/17 0520)     LOS: 2 days    Time spent:     Erick Blinks, MD [facility] Pager [phone]  If 7PM-7AM, please contact night-coverage [email or web address] Password [facility] 05/05/2017, 5:40 PM

## 2017-05-05 NOTE — Progress Notes (Signed)
Pharmacy Antibiotic Note  Erica [name] is a 84 y.o. female admitted on 05/03/2017 with UTI.  Pharmacy has been consulted for Cipro dosing.  Plan: Cipro 400mg  IV every 12 hours. Monitor labs, micro and vitals.   Height: 5' 2.5" (158.8 cm) Weight: 124 lb 12.5 oz (56.6 kg) IBW/kg (Calculated) : 51.25  Temp (24hrs), Avg:98.3 F (36.8 C), Min:98.2 F (36.8 C), Max:98.3 F (36.8 C)  Recent Labs  Lab 05/03/17 1332 05/04/17 0645 05/05/17 0525  WBC 17.9* 11.0* 7.8  CREATININE 1.51* 0.63 0.61    Estimated Creatinine Clearance: 43.9 mL/min (by C-G formula based on SCr of 0.61 mg/dL).    Allergies  Allergen Reactions  . Penicillins Itching and Rash   Antimicrobials this admission: Cipro 2/20 >>   Micro: No results found for this or any previous visit (from the past 240 hour(s)).  Thank you for allowing pharmacy to be a part of this patient's care.  3/20 A 05/05/2017 11:09 AM

## 2017-05-05 NOTE — Progress Notes (Addendum)
[facility] Progress Note     Subjective: Doing fair. Having flatus. No complaints. NG output down.   Objective: Vital signs in last 24 hours: Temp:  [98.2 F (36.8 C)-98.3 F (36.8 C)] 98.3 F (36.8 C) (02/22 0520) Pulse Rate:  [96-107] 96 (02/22 0520) Resp:  [16] 16 (02/22 0520) BP: (137-152)/(60-77) 152/77 (02/22 0520) SpO2:  [93 %-95 %] 95 % (02/22 0520) Last BM Date: 05/03/17  Intake/Output from previous day: 02/21 0701 - 02/22 0700 In: 511.3 [I.V.:511.3] Out: 100 [Emesis/NG output:100] Intake/Output this shift: Total I/O In: -  Out: 300 [Urine:300]  General appearance: alert, cooperative and no distress Resp: normal work of breathing GI: soft, distended, but decreasing, mild pain to deep palpation Extremities: extremities normal, atraumatic, no cyanosis or edema  Lab Results:  Recent Labs    05/04/17 0645 05/05/17 0525  WBC 11.0* 7.8  HGB 13.2 11.8*  HCT 41.9 38.0  PLT 184 170   BMET Recent Labs    05/04/17 0645 05/05/17 0525  NA 139 138  K 3.4* 3.7  CL 104 106  CO2 26 22  GLUCOSE 120* 92  BUN 14 10  CREATININE 0.63 0.61  CALCIUM 8.2* 8.1*      Assessment/Plan: Erica [name] is a 84 yo with a SBO. She is having flatus and is less distended. Labs reasurring. Lytes replaced. -NG to gravity today -Can have ice 30cc every 2 hrs for comfort -Hope continue to improve and can remove in the next day  -Will hold off on SBFT until Monday if not improving  -Anesthesia medication information on the chart, will review   LOS: 2 days    Lucretia Roers 05/05/2017

## 2017-05-06 MED ORDER — LEVOTHYROXINE SODIUM 75 MCG PO TABS
75.0000 ug | ORAL_TABLET | Freq: Every day | ORAL | Status: DC
  Administered 2017-05-07 – 2017-05-09 (×3): 75 ug via ORAL
  Filled 2017-05-06 (×3): qty 1

## 2017-05-06 MED ORDER — METOPROLOL TARTRATE 25 MG PO TABS
25.0000 mg | ORAL_TABLET | Freq: Two times a day (BID) | ORAL | Status: DC
  Administered 2017-05-06 – 2017-05-09 (×6): 25 mg via ORAL
  Filled 2017-05-06 (×7): qty 1

## 2017-05-06 NOTE — Progress Notes (Signed)
PROGRESS NOTE    YANEISY WENZ  SEG:[id number] DOB: [birth year] DOA: 05/03/2017 PCP: Aliene Beams, MD   Brief Narrative:  84 year old female with a history of hyperlipidemia, hypothyroidism, hypertension, previous colostomy with history of bowel obstruction in the past, admitted to the hospital with nausea, vomiting related to small bowel obstruction.  General surgery following.  NG tube placed for decompression.  Treating supportively at this time.   Assessment & Plan:   Principal Problem:   Small bowel obstruction (HCC) Active Problems:   Tachycardia   Hyponatremia   AKI (acute kidney injury) (HCC)   1. Small bowel obstruction.  Slowly improving.  NG tube removed today.  She is having flatus but has not had a bowel movement yet.  Started on sips of clear liquids.  Encouraged to ambulate.  General surgery following. 2. Acute kidney injury.  Related to dehydration from nausea and vomiting.  Improved with IV fluids. 3. Possible urinary tract infection.  Treated with intravenous ciprofloxacin.  Urine culture shows nonspecific growth.  Further antibiotics were discontinued 4. Hypertension.  Transition metoprolol to p.o. 5. Hypothyroidism.  Continue on levothyroxine 6. Hyponatremia.  Related to volume depletion.  Improved with hydration   DVT prophylaxis: Heparin Code Status: Full code Family Communication: Discussed with family at the bedside Disposition Plan: Discharge home once improved   Consultants:   General surgery  Procedures:     Antimicrobials:   Cipro 2/20 > 2/22   Subjective: No nausea or vomiting.  She is having flatus.  No bowel movements.  Objective: Vitals:   05/05/17 1500 05/05/17 2100 05/06/17 0620 05/06/17 1400  BP: (!) 147/75 (!) 145/76 135/78 (!) 151/71  Pulse: 92 (!) 112 (!) 113 (!) 108  Resp: 17 16 16 20   Temp: 98.7 F (37.1 C) 98.4 F (36.9 C) 97.7 F (36.5 C) 98.7 F (37.1 C)  TempSrc: Oral Oral Oral Oral  SpO2: 98% 98% 96% 94%    Weight:   56.2 kg (124 lb)   Height:        Intake/Output Summary (Last 24 hours) at 05/06/2017 1730 Last data filed at 05/06/2017 1500 Gross per 24 hour  Intake 1431.25 ml  Output 1500 ml  Net -68.75 ml   Filed Weights   05/03/17 1310 05/03/17 1759 05/06/17 0620  Weight: 54.9 kg (121 lb) 56.6 kg (124 lb 12.5 oz) 56.2 kg (124 lb)    Examination:  General exam: Alert, awake, oriented x 3 Respiratory system: Clear to auscultation. Respiratory effort normal. Cardiovascular system:RRR. No murmurs, rubs, gallops. Gastrointestinal system: Abdomen is nondistended, soft and nontender. No organomegaly or masses felt. Normal bowel sounds are sluggish. Central nervous system: Alert and oriented. No focal neurological deficits. Extremities: No C/C/E, +pedal pulses Skin: No rashes, lesions or ulcers Psychiatry: Judgement and insight appear normal. Mood & affect appropriate.        Data Reviewed: I have personally reviewed following labs and imaging studies  CBC: Recent Labs  Lab 05/03/17 1332 05/04/17 0645 05/05/17 0525  WBC 17.9* 11.0* 7.8  NEUTROABS 15.1*  --   --   HGB 15.0 13.2 11.8*  HCT 46.1* 41.9 38.0  MCV 97.1 99.3 100.3*  PLT 204 184 170   Basic Metabolic Panel: Recent Labs  Lab 05/03/17 1332 05/04/17 0645 05/05/17 0525  NA 133* 139 138  K 3.8 3.4* 3.7  CL 93* 104 106  CO2 25 26 22   GLUCOSE 166* 120* 92  BUN 29* 14 10  CREATININE 1.51* 0.63 0.61  CALCIUM 9.3  8.2* 8.1*  MG  --  2.1 1.7  PHOS  --  3.7 1.9*   GFR: Estimated Creatinine Clearance: 43.9 mL/min (by C-G formula based on SCr of 0.61 mg/dL). Liver Function Tests: Recent Labs  Lab 05/03/17 1332 05/04/17 0645  AST 24 19  ALT 20 15  ALKPHOS 84 63  BILITOT 0.7 0.5  PROT 8.4* 6.5  ALBUMIN 4.4 3.4*   Recent Labs  Lab 05/03/17 1332  LIPASE 22   No results for input(s): AMMONIA in the last 168 hours. Coagulation Profile: No results for input(s): INR, PROTIME in the last 168  hours. Cardiac Enzymes: Recent Labs  Lab 05/03/17 1332 05/03/17 1810 05/03/17 2331 05/04/17 0645  TROPONINI <0.03 <0.03 <0.03 <0.03   BNP (last 3 results) No results for input(s): PROBNP in the last 8760 hours. HbA1C: No results for input(s): HGBA1C in the last 72 hours. CBG: No results for input(s): GLUCAP in the last 168 hours. Lipid Profile: No results for input(s): CHOL, HDL, LDLCALC, TRIG, CHOLHDL, LDLDIRECT in the last 72 hours. Thyroid Function Tests: No results for input(s): TSH, T4TOTAL, FREET4, T3FREE, THYROIDAB in the last 72 hours. Anemia Panel: No results for input(s): VITAMINB12, FOLATE, FERRITIN, TIBC, IRON, RETICCTPCT in the last 72 hours. Sepsis Labs: No results for input(s): PROCALCITON, LATICACIDVEN in the last 168 hours.  Recent Results (from the past 240 hour(s))  Culture, Urine     Status: Abnormal   Collection Time: 05/03/17  5:24 PM  Result Value Ref Range Status   Specimen Description   Final    URINE, CLEAN CATCH Performed at [facility], [address], [place], [place] [address]    Special Requests   Final    NONE Performed at [facility], [address] [place], [place] [address]    Culture MULTIPLE SPECIES PRESENT, SUGGEST RECOLLECTION (A)  Final   Report Status 05/05/2017 FINAL  Final         Radiology Studies: No results found.      Scheduled Meds: . heparin injection (subcutaneous)  5,000 Units Subcutaneous Q8H  . levothyroxine  75 mcg Intravenous Daily  . metoprolol tartrate  2.5 mg Intravenous Q6H  . pantoprazole (PROTONIX) IV  40 mg Intravenous Q24H  . selegiline  9 mg Transdermal Daily   Continuous Infusions: . 0.9 % NaCl with KCl 20 mEq / L 75 mL/hr at 05/06/17 1206     LOS: 3 days    Time spent:     Erick Blinks, MD [facility] Pager [phone]  If 7PM-7AM, please contact night-coverage [email or web address] Password [facility] 05/06/2017, 5:30 PM

## 2017-05-06 NOTE — Progress Notes (Signed)
[facility] Progress Note     Subjective: Having flatus. No BMs. Tolerated gravity on NG.   Objective: Vital signs in last 24 hours: Temp:  [97.7 F (36.5 C)-98.7 F (37.1 C)] 97.7 F (36.5 C) (02/23 0620) Pulse Rate:  [92-113] 113 (02/23 0620) Resp:  [16-17] 16 (02/23 0620) BP: (135-147)/(75-78) 135/78 (02/23 0620) SpO2:  [96 %-98 %] 96 % (02/23 0620) Weight:  [124 lb (56.2 kg)] 124 lb (56.2 kg) (02/23 0620) Last BM Date: 05/03/17  Intake/Output from previous day: 02/22 0701 - 02/23 0700 In: 1320 [I.V.:1320] Out: 1800 [Urine:1800] Intake/Output this shift: No intake/output data recorded.  General appearance: alert, cooperative and no distress Resp: normal work of breathing GI: soft, mildy distended, nontender  Extremities: extremities normal, atraumatic, no cyanosis or edema  Lab Results:  Recent Labs    05/04/17 0645 05/05/17 0525  WBC 11.0* 7.8  HGB 13.2 11.8*  HCT 41.9 38.0  PLT 184 170   BMET Recent Labs    05/04/17 0645 05/05/17 0525  NA 139 138  K 3.4* 3.7  CL 104 106  CO2 26 22  GLUCOSE 120* 92  BUN 14 10  CREATININE 0.63 0.61  CALCIUM 8.2* 8.1*   Anti-infectives: Anti-infectives (From admission, onward)   Start     Dose/Rate Route Frequency Ordered Stop   05/04/17 1800  ciprofloxacin (CIPRO) IVPB 400 mg  Status:  Discontinued     400 mg 200 mL/hr over 60 Minutes Intravenous Every 12 hours 05/04/17 1513 05/05/17 1739   05/03/17 1930  ciprofloxacin (CIPRO) IVPB 400 mg  Status:  Discontinued     400 mg 200 mL/hr over 60 Minutes Intravenous Every 24 hours 05/03/17 1856 05/04/17 1513      Assessment/Plan: Erica [name] is a 84 yo with resolving SBO. She has been having flatus and is significantly less distended.  -NG removed -Sips/ chips today  -If has BM can advance to full liquids    LOS: 3 days    Erica [name] 05/06/2017

## 2017-05-07 ENCOUNTER — Inpatient Hospital Stay (HOSPITAL_COMMUNITY): Payer: Medicare Other

## 2017-05-07 DIAGNOSIS — W19XXXA Unspecified fall, initial encounter: Secondary | ICD-10-CM | POA: Diagnosis not present

## 2017-05-07 DIAGNOSIS — W19XXXD Unspecified fall, subsequent encounter: Secondary | ICD-10-CM

## 2017-05-07 DIAGNOSIS — S2231XA Fracture of one rib, right side, initial encounter for closed fracture: Secondary | ICD-10-CM | POA: Diagnosis present

## 2017-05-07 MED ORDER — BISACODYL 10 MG RE SUPP
10.0000 mg | Freq: Once | RECTAL | Status: AC
  Administered 2017-05-07: 10 mg via RECTAL
  Filled 2017-05-07: qty 1

## 2017-05-07 MED ORDER — OXYCODONE-ACETAMINOPHEN 5-325 MG PO TABS: 1.0000 | ORAL_TABLET | Freq: Four times a day (QID) | ORAL | Status: DC | PRN

## 2017-05-07 NOTE — Progress Notes (Signed)
Now patient states that she went to the bathroom got back in the bed when she finished and realized the light was on and the door was open.  She then got back out of the bed to cut off the light and shut the door and fell.

## 2017-05-07 NOTE — Progress Notes (Addendum)
Patient told the tech approx 0430 that she fell and that her side hurt.  Tech looked and saw a large hematoma to the right flank and immediately notified the nurse.  On inspection the hematoma is from the right ribs to the right hip.  Patient said that she had gotten up to go to the bathroom without calling staff approx. [phone].  She went to the bathroom and on the way back to bed she tripped over her feet and fell in the floor hitting her right side.  Then she got up and got back into bed without calling staff.  Patient stated that she did not hit her head.  Dr. Antionette Char was notified via text page and placed new orders.  Bed alarm was turned on.  [facility] and Charge Nurse were notified.

## 2017-05-07 NOTE — Progress Notes (Signed)
PROGRESS NOTE    Erica [name]  TMA:[id number] DOB: 06-29-1932 DOA: 05/03/2017 PCP: Aliene Beams, MD   Brief Narrative:  84 year old female with a history of hyperlipidemia, hypothyroidism, hypertension, previous colostomy with history of bowel obstruction in the past, admitted to the hospital with nausea, vomiting related to small bowel obstruction.  General surgery following.  NG tube placed for decompression, but now discontinued.  Treating supportively at this time.  Hospital course complicated by patient fall leading to right-sided rib fracture.   Assessment & Plan:   Principal Problem:   Small bowel obstruction (HCC) Active Problems:   Tachycardia   Hyponatremia   AKI (acute kidney injury) (HCC)   Fall   Right rib fracture   1. Small bowel obstruction.  Slowly improving.  Patient had NG tube for decompression, but this was removed on 2/23.  She is having flatus but has not had a bowel movement yet.  Will give suppository.  Start on clear liquids.  Encouraged to ambulate.  General surgery following. 2. Acute kidney injury.  Related to dehydration from nausea and vomiting.  Improved with IV fluids. 3. Possible urinary tract infection.  Treated with intravenous ciprofloxacin.  Urine culture showed nonspecific growth.  Further antibiotics were discontinued 4. Hypertension.  Blood pressure stable.  Continue metoprolol 5. Hypothyroidism.  Continue on levothyroxine 6. Hyponatremia.  Related to volume depletion.  Improved with hydration 7. Right-sided rib fracture.  Patient suffered a fall after walking to the door to her room.  She lost her balance and fell.  X-rays confirm right sided rib fracture.  Continue pain management and incentive spirometry.   DVT prophylaxis: SCDs Code Status: Full code Family Communication: No family present Disposition Plan: Discharge home once improved   Consultants:   General surgery  Procedures:     Antimicrobials:   Cipro 2/20 >  2/22   Subjective: Having flatus, no bowel movement.  No nausea or vomiting.  Patient had a fall last night when walking to close her door.  She had significant pain on her right side and x-rays indicated right-sided rib fracture.  She does not have any shortness of breath at this time and pain is controlled.  Objective: Vitals:   05/06/17 1400 05/06/17 2300 05/07/17 0500 05/07/17 1421  BP: (!) 151/71 136/72 137/73 138/67  Pulse: (!) 108 100 90 87  Resp: 20 18 17 20   Temp: 98.7 F (37.1 C) 98.3 F (36.8 C) 98.3 F (36.8 C) 98.4 F (36.9 C)  TempSrc: Oral Oral Oral Oral  SpO2: 94% 97% 94% 95%  Weight:   53.1 kg (117 lb)   Height:        Intake/Output Summary (Last 24 hours) at 05/07/2017 1637 Last data filed at 05/07/2017 0500 Gross per 24 hour  Intake 900 ml  Output 800 ml  Net 100 ml   Filed Weights   05/03/17 1759 05/06/17 0620 05/07/17 0500  Weight: 56.6 kg (124 lb 12.5 oz) 56.2 kg (124 lb) 53.1 kg (117 lb)    Examination:  General exam: Alert, awake, oriented x 3 Respiratory system: Clear to auscultation. Respiratory effort normal. Cardiovascular system:RRR. No murmurs, rubs, gallops. Gastrointestinal system: Abdomen is nondistended, soft and nontender. No organomegaly or masses felt. Normal bowel sounds are sluggish. Central nervous system: Alert and oriented. No focal neurological deficits. Extremities: No C/C/E, +pedal pulses Skin: Bruising over right sided ribs, tender over ribs Psychiatry: Judgement and insight appear normal. Mood & affect appropriate.        Data  Reviewed: I have personally reviewed following labs and imaging studies  CBC: Recent Labs  Lab 05/03/17 1332 05/04/17 0645 05/05/17 0525  WBC 17.9* 11.0* 7.8  NEUTROABS 15.1*  --   --   HGB 15.0 13.2 11.8*  HCT 46.1* 41.9 38.0  MCV 97.1 99.3 100.3*  PLT 204 184 170   Basic Metabolic Panel: Recent Labs  Lab 05/03/17 1332 05/04/17 0645 05/05/17 0525  NA 133* 139 138  K 3.8 3.4*  3.7  CL 93* 104 106  CO2 25 26 22   GLUCOSE 166* 120* 92  BUN 29* 14 10  CREATININE 1.51* 0.63 0.61  CALCIUM 9.3 8.2* 8.1*  MG  --  2.1 1.7  PHOS  --  3.7 1.9*   GFR: Estimated Creatinine Clearance: 43.9 mL/min (by C-G formula based on SCr of 0.61 mg/dL). Liver Function Tests: Recent Labs  Lab 05/03/17 1332 05/04/17 0645  AST 24 19  ALT 20 15  ALKPHOS 84 63  BILITOT 0.7 0.5  PROT 8.4* 6.5  ALBUMIN 4.4 3.4*   Recent Labs  Lab 05/03/17 1332  LIPASE 22   No results for input(s): AMMONIA in the last 168 hours. Coagulation Profile: No results for input(s): INR, PROTIME in the last 168 hours. Cardiac Enzymes: Recent Labs  Lab 05/03/17 1332 05/03/17 1810 05/03/17 2331 05/04/17 0645  TROPONINI <0.03 <0.03 <0.03 <0.03   BNP (last 3 results) No results for input(s): PROBNP in the last 8760 hours. HbA1C: No results for input(s): HGBA1C in the last 72 hours. CBG: No results for input(s): GLUCAP in the last 168 hours. Lipid Profile: No results for input(s): CHOL, HDL, LDLCALC, TRIG, CHOLHDL, LDLDIRECT in the last 72 hours. Thyroid Function Tests: No results for input(s): TSH, T4TOTAL, FREET4, T3FREE, THYROIDAB in the last 72 hours. Anemia Panel: No results for input(s): VITAMINB12, FOLATE, FERRITIN, TIBC, IRON, RETICCTPCT in the last 72 hours. Sepsis Labs: No results for input(s): PROCALCITON, LATICACIDVEN in the last 168 hours.  Recent Results (from the past 240 hour(s))  Culture, Urine     Status: Abnormal   Collection Time: 05/03/17  5:24 PM  Result Value Ref Range Status   Specimen Description   Final    URINE, CLEAN CATCH Performed at [facility], [address], [place], [place]    Special Requests   Final    NONE Performed at [facility], [address], [place], [place]    Culture MULTIPLE SPECIES PRESENT, SUGGEST RECOLLECTION (A)  Final   Report Status 05/05/2017 FINAL  Final         Radiology Studies: Dg Ribs Unilateral  W/chest Right  Result Date: 05/07/2017 CLINICAL DATA:  Fall of at the right chest wall injury EXAM: RIGHT RIBS AND CHEST - 3+ VIEW COMPARISON:  05/03/2017 chest radiograph. FINDINGS: Stable cardiomediastinal silhouette with normal heart size. No pneumothorax. No pleural effusion. Mild platelike scarring versus atelectasis at the lung bases. No pulmonary edema. No acute consolidative airspace disease. The area of symptomatic concern as indicated by the patient in the lower right chest wall was denoted with a metallic skin BB by the technologist. Nondisplaced acute lateral right tenth rib fracture. No additional fracture. Marked lumbar spondylosis. IMPRESSION: 1. Acute lateral right tenth rib fracture, nondisplaced. No pneumothorax. 2. Mild bibasilar scarring versus atelectasis. Electronically Signed   By: 05/05/2017 M.D.   On: 05/07/2017 07:31   Dg Abd 2 Views  Result Date: 05/07/2017 CLINICAL DATA:  SMALL BOWEL OBSTRUCTION, PATIENT STATES " SHE DID HAVE  A NG TUBE BUT THE TUBE WAS REMOVED YESTERDAY "PATIENT STATES " SHE HAS NOT BEEN ABLE TO HAVE A BOWEL MOVEMENT YET BUT DID PASS GAS EARLIER TODAY "HISTORY OF IBS, HYSTERECTOMY EXAM: ABDOMEN - 2 VIEW COMPARISON:  CT, 05/03/2017 FINDINGS: There is no bowel dilation to suggest obstruction.  No free air. Multiple surgical vascular clips are noted throughout the abdomen. There are bowel anastomosis staples in the left lower quadrant. There is mild opacity at the lung bases consistent with atelectasis. Visualized lungs are otherwise clear. IMPRESSION: 1. No bowel dilation is seen to suggest a residual small bowel obstruction. 2. No free air. Electronically Signed   By: Amie Portland M.D.   On: 05/07/2017 14:39        Scheduled Meds: . bisacodyl  10 mg Rectal Once  . levothyroxine  75 mcg Oral QAC breakfast  . metoprolol tartrate  25 mg Oral BID  . pantoprazole (PROTONIX) IV  40 mg Intravenous Q24H  . selegiline  9 mg Transdermal Daily   Continuous  Infusions:    LOS: 4 days    Time spent:     Erick Blinks, MD [facility] Pager [phone]  If 7PM-7AM, please contact night-coverage [email or web address] Password [facility] 05/07/2017, 4:37 PM

## 2017-05-07 NOTE — Progress Notes (Signed)
[facility] Progress Note     Subjective: Fell overnight. Rib fracture on the right. Pain on right side with coughing. She says her abdominal pain is minimal and no nausea/vomiting or bloating. Having flatus.   Objective: Vital signs in last 24 hours: Temp:  [98.3 F (36.8 C)-98.7 F (37.1 C)] 98.3 F (36.8 C) (02/24 0500) Pulse Rate:  [90-108] 90 (02/24 0500) Resp:  [17-20] 17 (02/24 0500) BP: (136-151)/(71-73) 137/73 (02/24 0500) SpO2:  [94 %-97 %] 94 % (02/24 0500) Weight:  [117 lb (53.1 kg)] 117 lb (53.1 kg) (02/24 0500) Last BM Date: 05/03/17  Intake/Output from previous day: 02/23 0701 - 02/24 0700 In: 1861.3 [P.O.:120; I.V.:1741.3] Out: 800 [Urine:800] Intake/Output this shift: No intake/output data recorded.  General appearance: alert, cooperative and no distress Resp: normal work of breathing GI: soft, minimal distended, non tender, no rebound or guarding Extremities: extremities normal, atraumatic, no cyanosis or edema  Lab Results:  Recent Labs    05/05/17 0525  WBC 7.8  HGB 11.8*  HCT 38.0  PLT 170   BMET Recent Labs    05/05/17 0525  NA 138  K 3.7  CL 106  CO2 22  GLUCOSE 92  BUN 10  CREATININE 0.61  CALCIUM 8.1*    Studies/Results: Dg Ribs Unilateral W/chest Right  Result Date: 05/07/2017 CLINICAL DATA:  Fall of at the right chest wall injury EXAM: RIGHT RIBS AND CHEST - 3+ VIEW COMPARISON:  05/03/2017 chest radiograph. FINDINGS: Stable cardiomediastinal silhouette with normal heart size. No pneumothorax. No pleural effusion. Mild platelike scarring versus atelectasis at the lung bases. No pulmonary edema. No acute consolidative airspace disease. The area of symptomatic concern as indicated by the patient in the lower right chest wall was denoted with a metallic skin BB by the technologist. Nondisplaced acute lateral right tenth rib fracture. No additional fracture. Marked lumbar spondylosis. IMPRESSION: 1. Acute lateral right  tenth rib fracture, nondisplaced. No pneumothorax. 2. Mild bibasilar scarring versus atelectasis. Electronically Signed   By: Delbert Phenix M.D.   On: 05/07/2017 07:31    Anti-infectives: Anti-infectives (From admission, onward)   Start     Dose/Rate Route Frequency Ordered Stop   05/04/17 1800  ciprofloxacin (CIPRO) IVPB 400 mg  Status:  Discontinued     400 mg 200 mL/hr over 60 Minutes Intravenous Every 12 hours 05/04/17 1513 05/05/17 1739   05/03/17 1930  ciprofloxacin (CIPRO) IVPB 400 mg  Status:  Discontinued     400 mg 200 mL/hr over 60 Minutes Intravenous Every 24 hours 05/03/17 1856 05/04/17 1513      Assessment/Plan: Erica [name] is a 84 yo with resolving SBO. She has been having flatus but no BMs. -Sips/ chips -Xray abdomen  Today  -If stool in vault, may need suppository -If has BMs then adv to full liquids   LOS: 4 days   Lucretia Roers 05/07/2017

## 2017-05-08 DIAGNOSIS — S2231XD Fracture of one rib, right side, subsequent encounter for fracture with routine healing: Secondary | ICD-10-CM

## 2017-05-08 DIAGNOSIS — W19XXXS Unspecified fall, sequela: Secondary | ICD-10-CM

## 2017-05-08 LAB — BASIC METABOLIC PANEL
Anion gap: 11 (ref 5–15)
BUN: 7 mg/dL (ref 6–20)
CALCIUM: 8.7 mg/dL — AB (ref 8.9–10.3)
CO2: 23 mmol/L (ref 22–32)
Chloride: 103 mmol/L (ref 101–111)
Creatinine, Ser: 0.67 mg/dL (ref 0.44–1.00)
GFR calc Af Amer: >60 mL/min (ref >60)
GLUCOSE: 95 mg/dL (ref 65–99)
POTASSIUM: 3.9 mmol/L (ref 3.5–5.1)
SODIUM: 137 mmol/L (ref 135–145)

## 2017-05-08 LAB — MAGNESIUM: Magnesium: 1.7 mg/dL (ref 1.7–2.4)

## 2017-05-08 LAB — PHOSPHORUS: Phosphorus: 2.9 mg/dL (ref 2.5–4.6)

## 2017-05-08 MED ORDER — MAGNESIUM SULFATE 2 GM/50ML IV SOLN
2.0000 g | Freq: Once | INTRAVENOUS | Status: AC
  Administered 2017-05-08: 2 g via INTRAVENOUS
  Filled 2017-05-08: qty 50

## 2017-05-08 MED ORDER — ALUM & MAG HYDROXIDE-SIMETH 200-200-20 MG/5ML PO SUSP
30.0000 mL | Freq: Four times a day (QID) | ORAL | Status: DC | PRN
  Administered 2017-05-08 – 2017-05-09 (×2): 30 mL via ORAL
  Filled 2017-05-08 (×3): qty 30

## 2017-05-08 MED ORDER — PANTOPRAZOLE SODIUM 40 MG PO TBEC
40.0000 mg | DELAYED_RELEASE_TABLET | Freq: Every day | ORAL | Status: DC
  Administered 2017-05-08 – 2017-05-09 (×2): 40 mg via ORAL
  Filled 2017-05-08 (×2): qty 1

## 2017-05-08 NOTE — Progress Notes (Signed)
PROGRESS NOTE    Erica [name]  ZOX:[id number] DOB: 09/26/1932 DOA: 05/03/2017 PCP: Aliene Beams, MD   Brief Narrative:  84 year old female with a history of hyperlipidemia, hypothyroidism, hypertension, previous colostomy with history of bowel obstruction in the past, admitted to the hospital with nausea, vomiting related to small bowel obstruction.  General surgery following.  NG tube placed for decompression, but now discontinued.  Treating supportively at this time.  Hospital course complicated by patient fall leading to right-sided rib fracture.  Assessment & Plan:   Principal Problem:   Small bowel obstruction (HCC) Active Problems:   Tachycardia   Hyponatremia   AKI (acute kidney injury) (HCC)   Fall   Right rib fracture  1. Small bowel obstruction.  Pt continues to improve clinically but still not had a BM yet.  Patient had NG tube for decompression, but this was removed on 2/23.  She is having flatus but has not had a bowel movement.  Start on clear liquids, advance per surgery recommendations.  Encouraged to ambulate.  PT evaluation requested.  General surgery following. 2. Acute kidney injury.  Resolved now.  Related to dehydration from nausea and vomiting.  Improved with IV fluids. 3. Possible urinary tract infection.  Treated with intravenous ciprofloxacin.  Urine culture showed nonspecific growth.  Further antibiotics were discontinued. 4. Hypertension.  Blood pressure stable.  Continue metoprolol.   5. Hypothyroidism.  Continue on levothyroxine 6. Hyponatremia.  Resolved.  Related to volume depletion.  Improved with hydration 7. Right-sided rib fracture.  Patient suffered a fall after walking to the door to her room.  She lost her balance and fell.  X-rays confirm right sided rib fracture.  Continue pain management and incentive spirometry.  Fall precautions and PT evaluation recommended.   DVT prophylaxis: SCDs Code Status: Full code Family Communication: No family  present Disposition Plan: PT eval pending.  Discharge home once improved  Consultants:   General surgery  Antimicrobials:   Cipro 2/20 > 2/22   Subjective: Pt says she still has not had a BM yet although her abd pain better, her rib fracture pain persists.  She would like to ambulate more.    Objective: Vitals:   05/07/17 0500 05/07/17 1421 05/07/17 2046 05/08/17 0500  BP: 137/73 138/67 (!) 144/69 (!) 141/78  Pulse: 90 87 94 (!) 102  Resp: 17 20 18 17   Temp: 98.3 F (36.8 C) 98.4 F (36.9 C) 97.7 F (36.5 C) 98.3 F (36.8 C)  TempSrc: Oral Oral Oral Oral  SpO2: 94% 95% 96% 96%  Weight: 53.1 kg (117 lb)   52.9 kg (116 lb 11.2 oz)  Height:        Intake/Output Summary (Last 24 hours) at 05/08/2017 0838 Last data filed at 05/08/2017 0500 Gross per 24 hour  Intake -  Output 1800 ml  Net -1800 ml   Filed Weights   05/06/17 0620 05/07/17 0500 05/08/17 0500  Weight: 56.2 kg (124 lb) 53.1 kg (117 lb) 52.9 kg (116 lb 11.2 oz)   Examination:  General exam: Alert, awake, oriented x 3 Respiratory system: Clear to auscultation. Shallow breathing.  Cardiovascular system: normal s1,s2 sounds.  No murmurs, rubs, gallops. Gastrointestinal system: Abdomen is nondistended, soft and nontender. No organomegaly or masses felt. Normal bowel sounds are sluggish. Central nervous system: Alert and oriented. No focal neurological deficits. Extremities: No C/C/E, +pedal pulses Skin: Bruising over right sided ribs, tender over ribs Psychiatry: Judgement and insight appear normal. Mood & affect appropriate.  Data Reviewed: I have personally reviewed following labs and imaging studies  CBC: Recent Labs  Lab 05/03/17 1332 05/04/17 0645 05/05/17 0525  WBC 17.9* 11.0* 7.8  NEUTROABS 15.1*  --   --   HGB 15.0 13.2 11.8*  HCT 46.1* 41.9 38.0  MCV 97.1 99.3 100.3*  PLT 204 184 170   Basic Metabolic Panel: Recent Labs  Lab 05/03/17 1332 05/04/17 0645 05/05/17 0525 05/08/17 0544    NA 133* 139 138 137  K 3.8 3.4* 3.7 3.9  CL 93* 104 106 103  CO2 25 26 22 23   GLUCOSE 166* 120* 92 95  BUN 29* 14 10 7   CREATININE 1.51* 0.63 0.61 0.67  CALCIUM 9.3 8.2* 8.1* 8.7*  MG  --  2.1 1.7 1.7  PHOS  --  3.7 1.9* 2.9   GFR: Estimated Creatinine Clearance: 43.9 mL/min (by C-G formula based on SCr of 0.67 mg/dL). Liver Function Tests: Recent Labs  Lab 05/03/17 1332 05/04/17 0645  AST 24 19  ALT 20 15  ALKPHOS 84 63  BILITOT 0.7 0.5  PROT 8.4* 6.5  ALBUMIN 4.4 3.4*   Recent Labs  Lab 05/03/17 1332  LIPASE 22   No results for input(s): AMMONIA in the last 168 hours. Coagulation Profile: No results for input(s): INR, PROTIME in the last 168 hours. Cardiac Enzymes: Recent Labs  Lab 05/03/17 1332 05/03/17 1810 05/03/17 2331 05/04/17 0645  TROPONINI <0.03 <0.03 <0.03 <0.03   BNP (last 3 results) No results for input(s): PROBNP in the last 8760 hours. HbA1C: No results for input(s): HGBA1C in the last 72 hours. CBG: No results for input(s): GLUCAP in the last 168 hours. Lipid Profile: No results for input(s): CHOL, HDL, LDLCALC, TRIG, CHOLHDL, LDLDIRECT in the last 72 hours. Thyroid Function Tests: No results for input(s): TSH, T4TOTAL, FREET4, T3FREE, THYROIDAB in the last 72 hours. Anemia Panel: No results for input(s): VITAMINB12, FOLATE, FERRITIN, TIBC, IRON, RETICCTPCT in the last 72 hours. Sepsis Labs: No results for input(s): PROCALCITON, LATICACIDVEN in the last 168 hours.  Recent Results (from the past 240 hour(s))  Culture, Urine     Status: Abnormal   Collection Time: 05/03/17  5:24 PM  Result Value Ref Range Status   Specimen Description   Final    URINE, CLEAN CATCH Performed at [facility], [address], [place], [address] [place]    Special Requests   Final    NONE Performed at [facility], [address], [place], [address] [place]    Culture MULTIPLE SPECIES PRESENT, SUGGEST RECOLLECTION (A)  Final   Report Status 05/05/2017  FINAL  Final     Radiology Studies: Dg Ribs Unilateral W/chest Right  Result Date: 05/07/2017 CLINICAL DATA:  Fall of at the right chest wall injury EXAM: RIGHT RIBS AND CHEST - 3+ VIEW COMPARISON:  05/03/2017 chest radiograph. FINDINGS: Stable cardiomediastinal silhouette with normal heart size. No pneumothorax. No pleural effusion. Mild platelike scarring versus atelectasis at the lung bases. No pulmonary edema. No acute consolidative airspace disease. The area of symptomatic concern as indicated by the patient in the lower right chest wall was denoted with a metallic skin BB by the technologist. Nondisplaced acute lateral right tenth rib fracture. No additional fracture. Marked lumbar spondylosis. IMPRESSION: 1. Acute lateral right tenth rib fracture, nondisplaced. No pneumothorax. 2. Mild bibasilar scarring versus atelectasis. Electronically Signed   By: 05/09/2017 M.D.   On: 05/07/2017 07:31   Dg Abd 2 Views  Result Date: 05/07/2017 CLINICAL DATA:  SMALL BOWEL OBSTRUCTION, PATIENT STATES " SHE DID HAVE A NG TUBE BUT THE TUBE WAS REMOVED YESTERDAY "PATIENT STATES " SHE HAS NOT BEEN ABLE TO HAVE A BOWEL MOVEMENT YET BUT DID PASS GAS EARLIER TODAY "HISTORY OF IBS, HYSTERECTOMY EXAM: ABDOMEN - 2 VIEW COMPARISON:  CT, 05/03/2017 FINDINGS: There is no bowel dilation to suggest obstruction.  No free air. Multiple surgical vascular clips are noted throughout the abdomen. There are bowel anastomosis staples in the left lower quadrant. There is mild opacity at the lung bases consistent with atelectasis. Visualized lungs are otherwise clear. IMPRESSION: 1. No bowel dilation is seen to suggest a residual small bowel obstruction. 2. No free air. Electronically Signed   By: Amie Portland M.D.   On: 05/07/2017 14:39   Scheduled Meds: . levothyroxine  75 mcg Oral QAC breakfast  . metoprolol tartrate  25 mg Oral BID  . pantoprazole (PROTONIX) IV  40 mg Intravenous Q24H  . selegiline  9 mg Transdermal Daily    Continuous Infusions:   LOS: 5 days   Time spent: 25 mins   Standley Dakins, MD [facility] Pager [phone]  If 7PM-7AM, please contact night-coverage [email or web address] Password [facility] 05/08/2017, 8:38 AM

## 2017-05-08 NOTE — Evaluation (Signed)
Physical Therapy Evaluation Patient Details Name: Erica [name] MRN: [id number] DOB: Jun 07, 1932 Today's Date: 05/08/2017   History of Present Illness  84 yo female with onset of SBO was admitted, had NG tube, atelectasis, esophagitis, and finally permitted liquid diet.  PMHx:  lumbar spondylosis, bipolar anxiety, IBS, RA, hypothyroidism  Clinical Impression  Pt is up to walk with assistance and discussed her deficits with family, recommending SNF for return home with more independence given her caregiver status.  Pt is in somewhat an agreement but also does recognize that she cannot handle her responsibilities at home yet.  Follow acutely for return to stronger and more balanced function to shorten her need to stay in SNF. Will need to try RW next visit to decrease her instability standing.    Follow Up Recommendations SNF    Equipment Recommendations  None recommended by PT    Recommendations for Other Services       Precautions / Restrictions Precautions Precautions: Fall(telemetry) Restrictions Weight Bearing Restrictions: No      Mobility  Bed Mobility Overal bed mobility: Needs Assistance Bed Mobility: Supine to Sit;Sit to Supine     Supine to sit: Min assist Sit to supine: Min assist;Mod assist      Transfers Overall transfer level: Needs assistance Equipment used: Rolling walker (2 wheeled);1 person hand held assist Transfers: Sit to/from Stand Sit to Stand: Mod assist         General transfer comment: able to stand better with higher surfaces and grab bars  Ambulation/Gait Ambulation/Gait assistance: Min assist Ambulation Distance (Feet): 14 Feet Assistive device: Rolling walker (2 wheeled);1 person hand held assist Gait Pattern/deviations: Step-to pattern;Step-through pattern;Decreased stride length;Wide base of support;Trunk flexed;Shuffle Gait velocity: reduced Gait velocity interpretation: Below normal speed for age/gender    Stairs             Wheelchair Mobility    Modified Rankin (Stroke Patients Only)       Balance Overall balance assessment: Needs assistance Sitting-balance support: Feet supported;Bilateral upper extremity supported Sitting balance-Leahy Scale: Fair     Standing balance support: Bilateral upper extremity supported;During functional activity Standing balance-Leahy Scale: Poor Standing balance comment: requires UE support to maintain standing balance                             Pertinent Vitals/Pain Pain Assessment: 0-10 Pain Score: 6  Pain Location: R thorax Pain Descriptors / Indicators: Tender Pain Intervention(s): Limited activity within patient's tolerance;Monitored during session;Repositioned;Premedicated before session    Home Living Family/patient expects to be discharged to:: Private residence Living Arrangements: Spouse/significant other Available Help at Discharge: Family;Available 24 hours/day(husband has dementia) Type of Home: House Home Access: Stairs to enter     Home Layout: One level Home Equipment: Environmental consultant - 4 wheels;Cane - single point      Prior Function Level of Independence: Independent;Independent with assistive device(s)(SPC outside only)               Hand Dominance   Dominant Hand: Right    Extremity/Trunk Assessment   Upper Extremity Assessment Upper Extremity Assessment: RUE deficits/detail RUE Deficits / Details: pain on R side of chest due to fracture RUE: Unable to fully assess due to pain RUE Coordination: decreased fine motor;decreased gross motor    Lower Extremity Assessment Lower Extremity Assessment: Generalized weakness    Cervical / Trunk Assessment Cervical / Trunk Assessment: Kyphotic  Communication   Communication: No difficulties  Cognition  Arousal/Alertness: Awake/alert Behavior During Therapy: WFL for tasks assessed/performed Overall Cognitive Status: Within Functional Limits for tasks assessed                                         General Comments General comments (skin integrity, edema, etc.): pt has macerated skin overlying R side of ribs, located over injury    Exercises     Assessment/Plan    PT Assessment Patient needs continued PT services  PT Problem List Decreased strength;Decreased activity tolerance;Decreased range of motion;Decreased balance;Decreased mobility;Decreased coordination;Decreased knowledge of use of DME;Decreased safety awareness;Cardiopulmonary status limiting activity;Decreased skin integrity;Pain       PT Treatment Interventions Gait training;DME instruction;Functional mobility training;Therapeutic activities;Therapeutic exercise;Balance training;Neuromuscular re-education;Patient/family education    PT Goals (Current goals can be found in the Care Plan section)  Acute Rehab PT Goals Patient Stated Goal: to get home and feel better PT Goal Formulation: With patient/family Time For Goal Achievement: 05/22/17 Potential to Achieve Goals: Good    Frequency Min 3X/week   Barriers to discharge Inaccessible home environment;Decreased caregiver support home with husband who is in need of care    Co-evaluation               AM-PAC PT "6 Clicks" Daily Activity  Outcome Measure Difficulty turning over in bed (including adjusting bedclothes, sheets and blankets)?: Unable Difficulty moving from lying on back to sitting on the side of the bed? : Unable Difficulty sitting down on and standing up from a chair with arms (e.g., wheelchair, bedside commode, etc,.)?: Unable Help needed moving to and from a bed to chair (including a wheelchair)?: A Lot Help needed walking in hospital room?: A Little Help needed climbing 3-5 steps with a railing? : Total 6 Click Score: 9    End of Session Equipment Utilized During Treatment: Gait belt Activity Tolerance: Patient limited by fatigue;Patient limited by pain Patient left: in bed;with call bell/phone  within reach;with bed alarm set;with family/visitor present Nurse Communication: Mobility status PT Visit Diagnosis: Unsteadiness on feet (R26.81);Other abnormalities of gait and mobility (R26.89);Muscle weakness (generalized) (M62.81);Pain;Adult, failure to thrive (R62.7) Pain - Right/Left: Right Pain - part of body: (ribcage)    Time: 2122-4825 PT Time Calculation (min) (ACUTE ONLY): 27 min   Charges:   PT Evaluation $PT Eval Moderate Complexity: 1 Mod PT Treatments $Gait Training: 8-22 mins   PT G Codes:   PT G-Codes **NOT FOR INPATIENT CLASS** Functional Assessment Tool Used: AM-PAC 6 Clicks Basic Mobility    Ivar Drape 05/08/2017, 9:28 PM   9:33 PM, 05/08/17 Samul Dada, PT, MS Physical Therapist - [place] [phone] (Office)

## 2017-05-08 NOTE — Progress Notes (Signed)
[facility] Progress Note     Subjective: Having flatus but no BM. Xray with non obstructive pattern. Says she hurts from the rib fractures.   Objective: Vital signs in last 24 hours: Temp:  [97.7 F (36.5 C)-98.4 F (36.9 C)] 98.3 F (36.8 C) (02/25 0500) Pulse Rate:  [87-102] 102 (02/25 0500) Resp:  [17-20] 17 (02/25 0500) BP: (138-144)/(67-78) 141/78 (02/25 0500) SpO2:  [95 %-96 %] 96 % (02/25 0500) Weight:  [116 lb 11.2 oz (52.9 kg)] 116 lb 11.2 oz (52.9 kg) (02/25 0500) Last BM Date: 05/03/17  Intake/Output from previous day: 02/24 0701 - 02/25 0700 In: -  Out: 1800 [Urine:1800] Intake/Output this shift: No intake/output data recorded.  General appearance: alert, cooperative and no distress Resp: normal work of breathing GI: soft, minimal distended, nontender   Lab Results:  No results for input(s): WBC, HGB, HCT, PLT in the last 72 hours. BMET Recent Labs    05/08/17 0544  NA 137  K 3.9  CL 103  CO2 23  GLUCOSE 95  BUN 7  CREATININE 0.67  CALCIUM 8.7*   PT/INR No results for input(s): LABPROT, INR in the last 72 hours.  Studies/Results: Dg Ribs Unilateral W/chest Right  Result Date: 05/07/2017 CLINICAL DATA:  Fall of at the right chest wall injury EXAM: RIGHT RIBS AND CHEST - 3+ VIEW COMPARISON:  05/03/2017 chest radiograph. FINDINGS: Stable cardiomediastinal silhouette with normal heart size. No pneumothorax. No pleural effusion. Mild platelike scarring versus atelectasis at the lung bases. No pulmonary edema. No acute consolidative airspace disease. The area of symptomatic concern as indicated by the patient in the lower right chest wall was denoted with a metallic skin BB by the technologist. Nondisplaced acute lateral right tenth rib fracture. No additional fracture. Marked lumbar spondylosis. IMPRESSION: 1. Acute lateral right tenth rib fracture, nondisplaced. No pneumothorax. 2. Mild bibasilar scarring versus atelectasis.  Electronically Signed   By: Delbert Phenix M.D.   On: 05/07/2017 07:31   Dg Abd 2 Views  Result Date: 05/07/2017 CLINICAL DATA:  SMALL BOWEL OBSTRUCTION, PATIENT STATES " SHE DID HAVE A NG TUBE BUT THE TUBE WAS REMOVED YESTERDAY "PATIENT STATES " SHE HAS NOT BEEN ABLE TO HAVE A BOWEL MOVEMENT YET BUT DID PASS GAS EARLIER TODAY "HISTORY OF IBS, HYSTERECTOMY EXAM: ABDOMEN - 2 VIEW COMPARISON:  CT, 05/03/2017 FINDINGS: There is no bowel dilation to suggest obstruction.  No free air. Multiple surgical vascular clips are noted throughout the abdomen. There are bowel anastomosis staples in the left lower quadrant. There is mild opacity at the lung bases consistent with atelectasis. Visualized lungs are otherwise clear. IMPRESSION: 1. No bowel dilation is seen to suggest a residual small bowel obstruction. 2. No free air. Electronically Signed   By: Amie Portland M.D.   On: 05/07/2017 14:39    Anti-infectives: Anti-infectives (From admission, onward)   Start     Dose/Rate Route Frequency Ordered Stop   05/04/17 1800  ciprofloxacin (CIPRO) IVPB 400 mg  Status:  Discontinued     400 mg 200 mL/hr over 60 Minutes Intravenous Every 12 hours 05/04/17 1513 05/05/17 1739   05/03/17 1930  ciprofloxacin (CIPRO) IVPB 400 mg  Status:  Discontinued     400 mg 200 mL/hr over 60 Minutes Intravenous Every 24 hours 05/03/17 1856 05/04/17 1513      Assessment/Plan: Ms. Atha is a 84 yo with resolving SBO. She has been having flatus but no BMs. -Xray reassuring.  -Full liquid diet  -Will  need to await BM    LOS: 5 days    Lucretia Roers 05/08/2017

## 2017-05-09 MED ORDER — DOCUSATE SODIUM 100 MG PO CAPS: 100.0000 mg | ORAL_CAPSULE | Freq: Two times a day (BID) | ORAL | 0 refills | Status: DC

## 2017-05-09 MED ORDER — AZITHROMYCIN 250 MG PO TABS
ORAL_TABLET | ORAL | 0 refills | Status: DC
Start: 2017-05-09 — End: 2017-05-30

## 2017-05-09 MED ORDER — METOPROLOL TARTRATE 25 MG PO TABS: 25.0000 mg | ORAL_TABLET | Freq: Two times a day (BID) | ORAL | 0 refills | Status: DC

## 2017-05-09 MED ORDER — OXYCODONE HCL 5 MG PO TABS: 2.5000 mg | ORAL_TABLET | Freq: Four times a day (QID) | ORAL | 0 refills | Status: DC | PRN

## 2017-05-09 MED ORDER — IBUPROFEN 200 MG PO CAPS: 1.0000 | ORAL_CAPSULE | Freq: Three times a day (TID) | ORAL | 0 refills | Status: DC | PRN

## 2017-05-09 NOTE — Clinical Social Work Note (Signed)
CSW consult received for SNF placement. MD stated during Progression today that pt has declined SNF and will dc home with HHC. LCSW will clear the consult.

## 2017-05-09 NOTE — Discharge Summary (Signed)
Physician Discharge Summary  BRIANNY SOULLIERE WUJ:[id number] DOB: Jun 03, 1932 DOA: 05/03/2017  PCP: Aliene Beams, MD  Admit date: 05/03/2017 Discharge date: 05/09/2017  Admitted From: Home  Disposition: Home with HH Services (PT DECLINED SNF PLACEMENT)  Recommendations for Outpatient Follow-up:  1. Follow up with PCP in 1 weeks for recheck.  2. Please repeat CXR in 2-4 weeks to evaluate rib fracture 3. Please obtain BMP/CBC in one week 4. Please follow up on the following pending results: Final culture data.  Home Health: YES, PT, OT, RN, SW  Discharge Condition: STABLE   CODE STATUS: FULL    Brief Hospitalization Summary: Please see all hospital notes, images, labs for full details of the hospitalization.  HPI:    Erica [name]  is a 84 y.o. female, w Hyperlipidemia, Hypothyroidism, RA, Bipolar, Anxiety, Bipolar, prior hysterectomy, colostomy, remote hx of bowel obstruction apparently presents w c/o n/v, abd pain.  Pt states that she was having abdominal discomfort for the past 2 days, and presented to ER last nite but the wait time was exceptionally long and therefore left because she was feeling slightly better.    Today, pt had n/v, (no hematemesis), and c/o abdominal pain, "sharp" and distension.  Pt denies fever, chills, constipation, brbpr, black stool.  Pt notes she has been trying to wean herself off protonix. Pt went to pcp today and was sent to ER for evaluation of viral gastritis.   In ED,  CT abd/ pelvis: IMPRESSION: 1. Small bowel obstruction with a angulated loop in the upper pelvis just to the right of midline representing the transition between dilated and nondilated bowel, probably due to an adhesion. Distended small bowel loops with air-fluid levels; distended stomach with an air-fluid level. 2. Small hiatal hernia. 3. Fluid-filled thick-walled distal esophagus favoring esophagitis.  Na 133 Glucose 166 Bun 29, creatinine 1.51  Ast 24, Alt 20 Trop  <0.03 Lipase 22 Urinalysis 6-30 wbc, 6-30 rbc.   ED consulted surgery   Pt admitted for evaluation of SBO.   Brief Narrative:  84 year old female with a history of hyperlipidemia, hypothyroidism, hypertension, previous colostomy with history of bowel obstruction in the past, admitted to the hospital with nausea, vomiting related to small bowel obstruction.  General surgery following.  NG tube placed for decompression, but now discontinued.  Treating supportively at this time.  Hospital course complicated by patient fall leading to right-sided rib fracture.  Assessment & Plan:   Principal Problem:   Small bowel obstruction (HCC) Active Problems:   Tachycardia   Hyponatremia   AKI (acute kidney injury) (HCC)   Fall   Right rib fracture  1. Small bowel obstruction. RESOLVED NOW. Pt continues to improve clinically and has had multiple bowel movements and feeling much better.   Patient had NG tube for decompression, but this was removed on 2/23.  She is having flatus.    She will discharged on a soft diet. Encouraged to ambulate.  PT evaluation recommended SNF but patient however has declined and would prefer home with home health services.  This is being arranged for the patient..  General surgery following and I spoke with Dr. Henreitta Leber and she agrees with discharge. 2. Acute kidney injury.  Resolved now.  Related to dehydration from nausea and vomiting.  Improved with IV fluids. 3. Possible urinary tract infection.  Treated with intravenous ciprofloxacin.  Urine culture showed nonspecific growth.  Further antibiotics were discontinued. 4. Hypertension.  Blood pressure stable.  Continue metoprolol.    Follow-up with PCP  for recheck. 5. Hypothyroidism.  Continue on levothyroxine 6. Hyponatremia.  Resolved.  Related to volume depletion.  Improved with hydration 7. Right-sided rib fracture.  Patient suffered a fall after walking to the door to her room.  She lost her balance and fell.  X-rays  confirm right sided rib fracture.  Continue pain management and incentive spirometry.  Fall precautions and PT evaluation recommended SNF placement but the patient has declined and would prefer home with home health services.   DVT prophylaxis: SCDs Code Status: Full code Family Communication: No family present Disposition Plan:  Home with home health services  Consultants:   General surgery  Antimicrobials:   Cipro 2/20 > 2/22  Discharge Diagnoses:  Principal Problem:   Small bowel obstruction (HCC) Active Problems:   Tachycardia   Hyponatremia   AKI (acute kidney injury) (HCC)   Fall   Right rib fracture  Discharge Instructions: Discharge Instructions    Call MD for:  difficulty breathing, headache or visual disturbances   Complete by:  As directed    Call MD for:  extreme fatigue   Complete by:  As directed    Call MD for:  persistant dizziness or light-headedness   Complete by:  As directed    Call MD for:  persistant nausea and vomiting   Complete by:  As directed    Call MD for:  severe uncontrolled pain   Complete by:  As directed    Increase activity slowly   Complete by:  As directed      Allergies as of 05/09/2017      Reactions   Penicillins Itching, Rash      Medication List    TAKE these medications   acetaminophen 650 MG CR tablet Commonly known as:  TYLENOL Take 1,300 mg by mouth daily.   amLODipine 2.5 MG tablet Commonly known as:  NORVASC Take 1 tablet (2.5 mg total) by mouth 2 (two) times daily.   azithromycin 250 MG tablet Commonly known as:  ZITHROMAX Take 2 tabs PO x 1 dose, then 1 tab PO QD x 4 days   docusate sodium 100 MG capsule Commonly known as:  COLACE Take 1 capsule (100 mg total) by mouth 2 (two) times daily.   levothyroxine 75 MCG tablet Commonly known as:  SYNTHROID, LEVOTHROID Take 1 tablet (75 mcg total) by mouth daily. Please dispense brand only thyroid medication/Synthroid.   LORazepam 0.5 MG tablet Commonly  known as:  ATIVAN Take 1/2 to 1 tablet by mouth twice a day as needed   metoprolol tartrate 25 MG tablet Commonly known as:  LOPRESSOR Take 1 tablet (25 mg total) by mouth 2 (two) times daily.   NON FORMULARY CBD OIL - 3 DROPS TOPICAL   oxyCODONE 5 MG immediate release tablet Commonly known as:  Oxy IR/ROXICODONE Take 0.5 tablets (2.5 mg total) by mouth every 6 (six) hours as needed for severe pain.   pantoprazole 40 MG tablet Commonly known as:  PROTONIX TAKE ONE TABLET BY MOUTH DAILY BEFORE BREAKFAST.   selegiline 6 MG/24HR Commonly known as:  EMSAM Place 1 patch (6 mg total) onto the skin daily.   VITAMIN B-12 PO Take by mouth daily.      Follow-up Information    Aliene Beams, MD. Schedule an appointment as soon as possible for a visit in 1 week(s).   Specialty:  Family Medicine Why:  Hospital follow-up Contact information: [address] [place] [address] [place] [address] [phone]  Allergies  Allergen Reactions  . Penicillins Itching and Rash   Allergies as of 05/09/2017      Reactions   Penicillins Itching, Rash      Medication List    TAKE these medications   acetaminophen 650 MG CR tablet Commonly known as:  TYLENOL Take 1,300 mg by mouth daily.   amLODipine 2.5 MG tablet Commonly known as:  NORVASC Take 1 tablet (2.5 mg total) by mouth 2 (two) times daily.   azithromycin 250 MG tablet Commonly known as:  ZITHROMAX Take 2 tabs PO x 1 dose, then 1 tab PO QD x 4 days   docusate sodium 100 MG capsule Commonly known as:  COLACE Take 1 capsule (100 mg total) by mouth 2 (two) times daily.   levothyroxine 75 MCG tablet Commonly known as:  SYNTHROID, LEVOTHROID Take 1 tablet (75 mcg total) by mouth daily. Please dispense brand only thyroid medication/Synthroid.   LORazepam 0.5 MG tablet Commonly known as:  ATIVAN Take 1/2 to 1 tablet by mouth twice a day as needed   metoprolol tartrate 25 MG tablet Commonly known as:  LOPRESSOR Take  1 tablet (25 mg total) by mouth 2 (two) times daily.   NON FORMULARY CBD OIL - 3 DROPS TOPICAL   oxyCODONE 5 MG immediate release tablet Commonly known as:  Oxy IR/ROXICODONE Take 0.5 tablets (2.5 mg total) by mouth every 6 (six) hours as needed for severe pain.   pantoprazole 40 MG tablet Commonly known as:  PROTONIX TAKE ONE TABLET BY MOUTH DAILY BEFORE BREAKFAST.   selegiline 6 MG/24HR Commonly known as:  EMSAM Place 1 patch (6 mg total) onto the skin daily.   VITAMIN B-12 PO Take by mouth daily.       Procedures/Studies: Dg Ribs Unilateral W/chest Right  Result Date: 05/07/2017 CLINICAL DATA:  Fall of at the right chest wall injury EXAM: RIGHT RIBS AND CHEST - 3+ VIEW COMPARISON:  05/03/2017 chest radiograph. FINDINGS: Stable cardiomediastinal silhouette with normal heart size. No pneumothorax. No pleural effusion. Mild platelike scarring versus atelectasis at the lung bases. No pulmonary edema. No acute consolidative airspace disease. The area of symptomatic concern as indicated by the patient in the lower right chest wall was denoted with a metallic skin BB by the technologist. Nondisplaced acute lateral right tenth rib fracture. No additional fracture. Marked lumbar spondylosis. IMPRESSION: 1. Acute lateral right tenth rib fracture, nondisplaced. No pneumothorax. 2. Mild bibasilar scarring versus atelectasis. Electronically Signed   By: Delbert Phenix M.D.   On: 05/07/2017 07:31   Ct Abdomen Pelvis W Contrast  Result Date: 05/03/2017 CLINICAL DATA:  Nausea and vomiting with epigastric burning sensation. Duration: 1 day. EXAM: CT ABDOMEN AND PELVIS WITH CONTRAST TECHNIQUE: Multidetector CT imaging of the abdomen and pelvis was performed using the standard protocol following bolus administration of intravenous contrast. CONTRAST:  35mL ISOVUE-300 IOPAMIDOL (ISOVUE-300) INJECTION 61% COMPARISON:  03/17/2015 CT scan FINDINGS: Lower chest: Bandlike densities compatible with scarring or  atelectasis in both lower lobes and in the lingula. Distal esophageal wall thickening with fluid-filled distal esophagus. Small type 1 hiatal hernia, distal esophagus and hernia with widely patent connection to the somewhat distended stomach. Circumflex coronary artery atherosclerotic calcification. Hepatobiliary: Stable 6 mm hypodense lesion in the dome of the right hepatic lobe on image 9/2. Stable similar lesion in the lateral segment left hepatic lobe, image 16/2. These lesions are too small to characterize but statistically likely to be benign. Small Phrygian cap along the gallbladder. Common bile  duct 6 mm in diameter, within normal limits. Pancreas: Unremarkable Spleen: Unremarkable Adrenals/Urinary Tract: Adrenal glands normal. Bilateral renal peripelvic cysts. 2 mm left kidney lower pole nonobstructive renal calculus. Stomach/Bowel: Notably distended stomach with air-fluid level. The duodenum never crosses the midline, favoring small bowel malrotation. Dilated duodenum and loops of jejunum up to 4.4 cm with internal air-fluid levels, terminating in a somewhat angulated loop in the vicinity of image 60/2 distal to which the small bowel has a normal/decompressed caliber. Anastomotic staple line in the proximal sigmoid colon. Mild sigmoid colon diverticulosis. Vascular/Lymphatic: Aortoiliac atherosclerotic vascular disease. No pathologic adenopathy identified. Reproductive: Uterus absent.  Adnexa unremarkable. Other: No supplemental non-categorized findings. Musculoskeletal: Levoconvex lumbar scoliosis with rotary component. IMPRESSION: 1. Small bowel obstruction with a angulated loop in the upper pelvis just to the right of midline representing the transition between dilated and nondilated bowel, probably due to an adhesion. Distended small bowel loops with air-fluid levels; distended stomach with an air-fluid level. 2. Small hiatal hernia. 3. Fluid-filled thick-walled distal esophagus favoring esophagitis.  4. Other imaging findings of potential clinical significance: Aortic Atherosclerosis (ICD10-I70.0). Coronary atherosclerosis. 2 mm left kidney lower pole nonobstructive renal calculus. Levoconvex lumbar scoliosis with rotary component. Electronically Signed   By: Gaylyn Rong M.D.   On: 05/03/2017 15:39   Dg Chest Portable 1 View  Result Date: 05/03/2017 CLINICAL DATA:  Nasogastric tube placement. EXAM: PORTABLE CHEST 1 VIEW COMPARISON:  Radiographs of April 07, 2017. FINDINGS: The heart size and mediastinal contours are within normal limits. No pneumothorax or pleural effusion is noted. Right lung is clear. Mild left basilar subsegmental atelectasis is noted. Nasogastric tube tips is seen in expected position of proximal stomach. The visualized skeletal structures are unremarkable. IMPRESSION: Nasogastric tube tip seen in expected position of proximal stomach. Mild left basilar subsegmental atelectasis. Electronically Signed   By: Lupita Raider, M.D.   On: 05/03/2017 17:50   Dg Abd 2 Views  Result Date: 05/07/2017 CLINICAL DATA:  SMALL BOWEL OBSTRUCTION, PATIENT STATES " SHE DID HAVE A NG TUBE BUT THE TUBE WAS REMOVED YESTERDAY "PATIENT STATES " SHE HAS NOT BEEN ABLE TO HAVE A BOWEL MOVEMENT YET BUT DID PASS GAS EARLIER TODAY "HISTORY OF IBS, HYSTERECTOMY EXAM: ABDOMEN - 2 VIEW COMPARISON:  CT, 05/03/2017 FINDINGS: There is no bowel dilation to suggest obstruction.  No free air. Multiple surgical vascular clips are noted throughout the abdomen. There are bowel anastomosis staples in the left lower quadrant. There is mild opacity at the lung bases consistent with atelectasis. Visualized lungs are otherwise clear. IMPRESSION: 1. No bowel dilation is seen to suggest a residual small bowel obstruction. 2. No free air. Electronically Signed   By: Amie Portland M.D.   On: 05/07/2017 14:39      Subjective: The patient has had multiple bowel movements and is feeling a lot better.  She declined SNF  placement.  She says that she would rather go home with home health services.  She is concerned about her husband at home that has dementia.  Discharge Exam: Vitals:   05/08/17 2149 05/09/17 0500  BP:  112/66  Pulse:  90  Resp:  16  Temp:  98.5 F (36.9 C)  SpO2: 93% 94%   Vitals:   05/08/17 2046 05/08/17 2149 05/09/17 0500 05/09/17 0616  BP: 130/64  112/66   Pulse: 94  90   Resp: 16  16   Temp: 98.2 F (36.8 C)  98.5 F (36.9 C)   TempSrc: Oral  Oral   SpO2: 98% 93% 94%   Weight:    53.9 kg (118 lb 13.3 oz)  Height:       General exam: Alert, awake, oriented x 3 Respiratory system: Clear to auscultation. Shallow breathing due to rib pain.  Cardiovascular system: normal s1,s2 sounds.  No murmurs, rubs, gallops. Gastrointestinal system: Abdomen is nondistended, soft and nontender. No organomegaly or masses felt. Normal bowel sounds are sluggish. Central nervous system: Alert and oriented. No focal neurological deficits. Extremities: No C/C/E, +pedal pulses Skin: Bruising over right sided ribs, tender over ribs Psychiatry: Judgement and insight appear normal. Mood & affect appropriate.    The results of significant diagnostics from this hospitalization (including imaging, microbiology, ancillary and laboratory) are listed below for reference.     Microbiology: Recent Results (from the past 240 hour(s))  Culture, Urine     Status: Abnormal   Collection Time: 05/03/17  5:24 PM  Result Value Ref Range Status   Specimen Description   Final    URINE, CLEAN CATCH Performed at [facility], [address] [place], [place] [address]    Special Requests   Final    NONE Performed at [facility], [address], [place], [place] [address]    Culture MULTIPLE SPECIES PRESENT, SUGGEST RECOLLECTION (A)  Final   Report Status 05/05/2017 FINAL  Final     Labs: BNP (last 3 results) No results for input(s): BNP in the last 8760 hours. Basic Metabolic Panel: Recent Labs  Lab  05/03/17 1332 05/04/17 0645 05/05/17 0525 05/08/17 0544  NA 133* 139 138 137  K 3.8 3.4* 3.7 3.9  CL 93* 104 106 103  CO2 25 26 22 23   GLUCOSE 166* 120* 92 95  BUN 29* 14 10 7   CREATININE 1.51* 0.63 0.61 0.67  CALCIUM 9.3 8.2* 8.1* 8.7*  MG  --  2.1 1.7 1.7  PHOS  --  3.7 1.9* 2.9   Liver Function Tests: Recent Labs  Lab 05/03/17 1332 05/04/17 0645  AST 24 19  ALT 20 15  ALKPHOS 84 63  BILITOT 0.7 0.5  PROT 8.4* 6.5  ALBUMIN 4.4 3.4*   Recent Labs  Lab 05/03/17 1332  LIPASE 22   No results for input(s): AMMONIA in the last 168 hours. CBC: Recent Labs  Lab 05/03/17 1332 05/04/17 0645 05/05/17 0525  WBC 17.9* 11.0* 7.8  NEUTROABS 15.1*  --   --   HGB 15.0 13.2 11.8*  HCT 46.1* 41.9 38.0  MCV 97.1 99.3 100.3*  PLT 204 184 170   Cardiac Enzymes: Recent Labs  Lab 05/03/17 1332 05/03/17 1810 05/03/17 2331 05/04/17 0645  TROPONINI <0.03 <0.03 <0.03 <0.03   BNP: Invalid input(s): POCBNP CBG: No results for input(s): GLUCAP in the last 168 hours. D-Dimer No results for input(s): DDIMER in the last 72 hours. Hgb A1c No results for input(s): HGBA1C in the last 72 hours. Lipid Profile No results for input(s): CHOL, HDL, LDLCALC, TRIG, CHOLHDL, LDLDIRECT in the last 72 hours. Thyroid function studies No results for input(s): TSH, T4TOTAL, T3FREE, THYROIDAB in the last 72 hours.  Invalid input(s): FREET3 Anemia work up No results for input(s): VITAMINB12, FOLATE, FERRITIN, TIBC, IRON, RETICCTPCT in the last 72 hours. Urinalysis    Component Value Date/Time   COLORURINE YELLOW 05/03/2017 1720   APPEARANCEUR CLOUDY (A) 05/03/2017 1720   APPEARANCEUR Clear 11/28/2016 1538   LABSPEC 1.029 05/03/2017 1720   PHURINE 6.0 05/03/2017 1720   GLUCOSEU NEGATIVE 05/03/2017 1720   HGBUR  NEGATIVE 05/03/2017 1720   BILIRUBINUR NEGATIVE 05/03/2017 1720   BILIRUBINUR Negative 11/28/2016 1538   KETONESUR NEGATIVE 05/03/2017 1720   PROTEINUR NEGATIVE 05/03/2017 1720    NITRITE NEGATIVE 05/03/2017 1720   LEUKOCYTESUR LARGE (A) 05/03/2017 1720   LEUKOCYTESUR 1+ (A) 11/28/2016 1538   Sepsis Labs Invalid input(s): PROCALCITONIN,  WBC,  LACTICIDVEN Microbiology Recent Results (from the past 240 hour(s))  Culture, Urine     Status: Abnormal   Collection Time: 05/03/17  5:24 PM  Result Value Ref Range Status   Specimen Description   Final    URINE, CLEAN CATCH Performed at [facility], [address] [place], [place] [address]    Special Requests   Final    NONE Performed at [facility], [address], [place], [place] [address]    Culture MULTIPLE SPECIES PRESENT, SUGGEST RECOLLECTION (A)  Final   Report Status 05/05/2017 FINAL  Final    Time coordinating discharge: 34 minutes  SIGNED:  Standley Dakins, MD  [facility] 05/09/2017, 10:23 AM Pager [phone]  If 7PM-7AM, please contact night-coverage [email or web address] Password [email or web address]

## 2017-05-09 NOTE — Care Management Important Message (Signed)
Important Message  Patient Details  Name: Erica [name] MRN: [id number] Date of Birth: 1932/09/12   Medicare Important Message Given:  Yes    Estefany Goebel, Chrystine Oiler, RN 05/09/2017, 11:36 AM

## 2017-05-09 NOTE — Progress Notes (Signed)
[facility] Progress Note     Subjective: Had multiple BMs. Pain in the right rib continues. PT recommended rehab. Patient discussed with me for a long time. Says her husband has dementia and she has assistance at home with 2 workers and her family. She wants to try home PT and see if that will work instead of going to rehab. Denies bloating or nausea/vomiting. Had some pain yesterday that was transient in the abdomen.   Objective: Vital signs in last 24 hours: Temp:  [97.5 F (36.4 C)-98.5 F (36.9 C)] 98.5 F (36.9 C) (02/26 0500) Pulse Rate:  [52-94] 90 (02/26 0500) Resp:  [16-19] 16 (02/26 0500) BP: (110-130)/(64-91) 112/66 (02/26 0500) SpO2:  [93 %-99 %] 94 % (02/26 0500) Weight:  [118 lb 13.3 oz (53.9 kg)] 118 lb 13.3 oz (53.9 kg) (02/26 0616) Last BM Date: 05/08/17  Intake/Output from previous day: 02/25 0701 - 02/26 0700 In: 1820 [P.O.:1820] Out: 2750 [Urine:2750] Intake/Output this shift: No intake/output data recorded.  General appearance: alert, cooperative and no distress Resp: normal work breathing GI: soft, nontender, somewhat distended   Lab Results:  No results for input(s): WBC, HGB, HCT, PLT in the last 72 hours. BMET Recent Labs    05/08/17 0544  NA 137  K 3.9  CL 103  CO2 23  GLUCOSE 95  BUN 7  CREATININE 0.67  CALCIUM 8.7*   PT/INR No results for input(s): LABPROT, INR in the last 72 hours.  Studies/Results: Dg Abd 2 Views  Result Date: 05/07/2017 CLINICAL DATA:  SMALL BOWEL OBSTRUCTION, PATIENT STATES " SHE DID HAVE A NG TUBE BUT THE TUBE WAS REMOVED YESTERDAY "PATIENT STATES " SHE HAS NOT BEEN ABLE TO HAVE A BOWEL MOVEMENT YET BUT DID PASS GAS EARLIER TODAY "HISTORY OF IBS, HYSTERECTOMY EXAM: ABDOMEN - 2 VIEW COMPARISON:  CT, 05/03/2017 FINDINGS: There is no bowel dilation to suggest obstruction.  No free air. Multiple surgical vascular clips are noted throughout the abdomen. There are bowel anastomosis staples in the left  lower quadrant. There is mild opacity at the lung bases consistent with atelectasis. Visualized lungs are otherwise clear. IMPRESSION: 1. No bowel dilation is seen to suggest a residual small bowel obstruction. 2. No free air. Electronically Signed   By: Amie Portland M.D.   On: 05/07/2017 14:39    Anti-infectives: Anti-infectives (From admission, onward)   Start     Dose/Rate Route Frequency Ordered Stop   05/04/17 1800  ciprofloxacin (CIPRO) IVPB 400 mg  Status:  Discontinued     400 mg 200 mL/hr over 60 Minutes Intravenous Every 12 hours 05/04/17 1513 05/05/17 1739   05/03/17 1930  ciprofloxacin (CIPRO) IVPB 400 mg  Status:  Discontinued     400 mg 200 mL/hr over 60 Minutes Intravenous Every 24 hours 05/03/17 1856 05/04/17 1513      Assessment/Plan: Erica [name] is a 84 yo with resolved SBO who also fell while in the hospital and has a right rib fracture.  -Diet as tolerated -Can follow up with her PCP -Patient wants home PT and to see if this will work, and wants to know if she can go to rehab/ snf if that plan failed. -Dr. Laural Benes updated and will discuss with case managers, and plans to get social worker at home to assess and help in such a situation    LOS: 6 days    Erica [name] 05/09/2017

## 2017-05-09 NOTE — Care Management Note (Signed)
Case Management Note  Patient Details  Name: Erica [name] MRN: [id number] Date of Birth: 1932/09/26  Subjective/Objective:   Adm with SBO, now resolved. From home with husband, who has dementia and 2 caregivers during the day. Family has been staying with patient at night. Patient has been recommended for SNF but prefers to go home with Home health provided by [facility]. She would like hospital bed. Declines need of other DME. Olegario Messier of [facility] notified and will obtain orders from Epic.              Action/Plan: Patient has discharge orders. She was unaware that she was discharging today and is worried about not having anyone at home currently as caregiver is with her husband at an appt in [place]. She plans to call family for help with transport home. Offered to order [facility] for patient, she declines.    Expected Discharge Date:  05/09/17               Expected Discharge Plan:  Home w Home Health Services  In-House Referral:  Clinical Social Work  Discharge planning Services  CM Consult  Post Acute Care Choice:  Home Health, Durable Medical Equipment Choice offered to:  Patient  DME Arranged:  Hospital bed DME Agency:  [facility]  HH Arranged:  RN, PT, OT, Social Work [employer] Agency:  [facility]  Status of Service:  Completed, signed off  If discussed at [employer], dates discussed:    Additional Comments:  Edmund Holcomb, Chrystine Oiler, RN 05/09/2017, 11:25 AM

## 2017-05-09 NOTE — Discharge Instructions (Signed)
Please eat a soft diet for the next week.  Please take stool softeners twice daily as recommended to keep your stools soft. Seek medical care or return if symptoms recur, worsen or new problems develop.    Follow with Primary MD  Aliene Beams, MD  and other consultant's as instructed your Hospitalist MD  Please get a complete blood count and chemistry panel checked by your Primary MD at your next visit, and again as instructed by your Primary MD.  Get Medicines reviewed and adjusted: Please take all your medications with you for your next visit with your Primary MD  Laboratory/radiological data: Please request your Primary MD to go over all hospital tests and procedure/radiological results at the follow up, please ask your Primary MD to get all Hospital records sent to his/her office.  In some cases, they will be blood work, cultures and biopsy results pending at the time of your discharge. Please request that your primary care M.D. follows up on these results.  Also Note the following: If you experience worsening of your admission symptoms, develop shortness of breath, life threatening emergency, suicidal or homicidal thoughts you must seek medical attention immediately by calling 911 or calling your MD immediately  if symptoms less severe.  You must read complete instructions/literature along with all the possible adverse reactions/side effects for all the Medicines you take and that have been prescribed to you. Take any new Medicines after you have completely understood and accpet all the possible adverse reactions/side effects.   Do not drive when taking Pain medications or sleeping medications (Benzodaizepines)  Do not take more than prescribed Pain, Sleep and Anxiety Medications. It is not advisable to combine anxiety,sleep and pain medications without talking with your primary care practitioner  Special Instructions: If you have smoked or chewed Tobacco  in the last 2 yrs please  stop smoking, stop any regular Alcohol  and or any Recreational drug use.  Wear Seat belts while driving.  Please note: You were cared for by a hospitalist during your hospital stay. Once you are discharged, your primary care physician will handle any further medical issues. Please note that NO REFILLS for any discharge medications will be authorized once you are discharged, as it is imperative that you return to your primary care physician (or establish a relationship with a primary care physician if you do not have one) for your post hospital discharge needs so that they can reassess your need for medications and monitor your lab values.     Small Bowel Obstruction A small bowel obstruction means that something is blocking the small bowel. The small bowel is also called the small intestine. It is the long tube that connects the stomach to the colon. An obstruction will stop food and fluids from passing through the small bowel. Treatment depends on what is causing the problem and how bad the problem is. Follow these instructions at home:  Get a lot of rest.  Follow your diet as told by your doctor. You may need to: ? Only drink clear liquids until you start to get better. ? Avoid solid foods as told by your doctor.  Take over-the-counter and prescription medicines only as told by your doctor.  Keep all follow-up visits as told by your doctor. This is important. Contact a doctor if:  You have a fever.  You have chills. Get help right away if:  You have pain or cramps that get worse.  You throw up (vomit) blood.  You have  a feeling of being sick to your stomach (nausea) that does not go away.  You cannot stop throwing up.  You cannot drink fluids.  You feel confused.  You feel dry or thirsty (dehydrated).  Your belly gets more bloated.  You feel weak or you pass out (faint). This information is not intended to replace advice given to you by your health care provider.  Make sure you discuss any questions you have with your health care provider. Document Released: 04/07/2004 Document Revised: 10/26/2015 Document Reviewed: 04/24/2014 Elsevier Interactive Patient Education  [employer].

## 2017-05-09 NOTE — Care Management (Signed)
Family wanting to talk about disposition. CM called and talked to Daughter in law. She reports she feels patient needs SNF. CM discussed with patient this morning, patient  wants to go home with home health. Patient is alert and oriented and able to make her own decisions. Patient reported to CM that family had been staying with her husband since she had been at hospital and could stay with her as well. She also reports that sometimes her husband's caregivers stays with them at night.  Relayed this to Daughter in law.  Home health has been arranged. Patient will have HH SW in patient needs to transfer to SNF from home. Hospital bed has been ordered and [facility] has been in contact with family and ready to deliver to home.  Daughter in law would like for patient to DC to SNF. Patient has been discharged since this morning and is not agreeable to SNF, therefore she will DC home with home health.

## 2017-05-10 DIAGNOSIS — Y92230 Patient room in hospital as the place of occurrence of the external cause: Secondary | ICD-10-CM | POA: Diagnosis not present

## 2017-05-10 DIAGNOSIS — S2231XD Fracture of one rib, right side, subsequent encounter for fracture with routine healing: Secondary | ICD-10-CM | POA: Diagnosis not present

## 2017-05-10 DIAGNOSIS — K449 Diaphragmatic hernia without obstruction or gangrene: Secondary | ICD-10-CM | POA: Diagnosis not present

## 2017-05-10 DIAGNOSIS — F419 Anxiety disorder, unspecified: Secondary | ICD-10-CM | POA: Diagnosis not present

## 2017-05-10 DIAGNOSIS — F319 Bipolar disorder, unspecified: Secondary | ICD-10-CM | POA: Diagnosis not present

## 2017-05-10 DIAGNOSIS — E039 Hypothyroidism, unspecified: Secondary | ICD-10-CM | POA: Diagnosis not present

## 2017-05-10 DIAGNOSIS — Z87891 Personal history of nicotine dependence: Secondary | ICD-10-CM | POA: Diagnosis not present

## 2017-05-10 DIAGNOSIS — K589 Irritable bowel syndrome without diarrhea: Secondary | ICD-10-CM | POA: Diagnosis not present

## 2017-05-10 DIAGNOSIS — Z5181 Encounter for therapeutic drug level monitoring: Secondary | ICD-10-CM | POA: Diagnosis not present

## 2017-05-10 DIAGNOSIS — I10 Essential (primary) hypertension: Secondary | ICD-10-CM | POA: Diagnosis not present

## 2017-05-10 DIAGNOSIS — Z8719 Personal history of other diseases of the digestive system: Secondary | ICD-10-CM | POA: Diagnosis not present

## 2017-05-10 DIAGNOSIS — M069 Rheumatoid arthritis, unspecified: Secondary | ICD-10-CM | POA: Diagnosis not present

## 2017-05-10 DIAGNOSIS — W010XXD Fall on same level from slipping, tripping and stumbling without subsequent striking against object, subsequent encounter: Secondary | ICD-10-CM | POA: Diagnosis not present

## 2017-05-11 ENCOUNTER — Telehealth: Payer: Self-pay | Admitting: Family Medicine

## 2017-05-11 NOTE — Telephone Encounter (Signed)
Transition Care Management Follow-up Telephone Call   Date discharged? 05/09/17               How have you been since you were released from the hospital? She states that with the cracked rib, she has had to have someone with her. Was put on abx for sore throat and cough.    Do you understand why you were in the hospital? Yes   Do you understand the discharge instructions? Yes   Where were you discharged to? Home   Items Reviewed:  Medications reviewed: BP med changed, but she went back on what Dr. Tracie Harrier gave  Allergies reviewed: Yes  Dietary changes reviewed: Yes  Referrals reviewed: Yes   Functional Questionnaire:   Activities of Daily Living (ADLs):  Is needing some one there to help due to fractured rib, otherwise fine.     Any transportation issues/concerns?: No.   Any patient concerns? Has not had a bowel movement since she has been home. Has been taking colace, but has not been eating much. It passing gas- a lot of it.    Confirmed importance and date/time of follow-up visits scheduled    Yes. But patient needs to change appointment. 8 am is too early.  Confirmed with patient if condition begins to worsen call PCP or go to the ER.  Patient was given the office number and encouraged to call back with question or concerns.  : Yes

## 2017-05-11 NOTE — Telephone Encounter (Signed)
Please offer her March 7 at 11:20, 40 minute appointment

## 2017-05-11 NOTE — Telephone Encounter (Signed)
Talked to Mrs Bessinger she will come in March 7 @ 11:20

## 2017-05-11 NOTE — Telephone Encounter (Signed)
Georgiann Hahn, Occupational Therapist, [facility], left message on nurse line regarding patient. She went out for OT evaluation of patient and states that she is doing well, she is able to complete most ADLs independently. She does have assistance available at home as needed. She was just doing a one time evaluation.   This is an [place]  Callback# [phone]

## 2017-05-11 NOTE — Telephone Encounter (Signed)
Yes,  but please give her a 40-minute appointment.

## 2017-05-16 ENCOUNTER — Ambulatory Visit: Payer: Self-pay | Admitting: Family Medicine

## 2017-05-16 DIAGNOSIS — E871 Hypo-osmolality and hyponatremia: Secondary | ICD-10-CM | POA: Diagnosis not present

## 2017-05-16 DIAGNOSIS — N179 Acute kidney failure, unspecified: Secondary | ICD-10-CM | POA: Diagnosis not present

## 2017-05-18 ENCOUNTER — Ambulatory Visit (INDEPENDENT_AMBULATORY_CARE_PROVIDER_SITE_OTHER): Payer: Medicare Other | Admitting: Family Medicine

## 2017-05-18 ENCOUNTER — Other Ambulatory Visit: Payer: Self-pay

## 2017-05-18 ENCOUNTER — Telehealth: Payer: Self-pay | Admitting: Family Medicine

## 2017-05-18 ENCOUNTER — Encounter: Payer: Self-pay | Admitting: Family Medicine

## 2017-05-18 VITALS — BP 138/88 | HR 78 | Temp 98.4°F | Resp 16 | Ht 63.0 in | Wt 115.2 lb

## 2017-05-18 DIAGNOSIS — K56609 Unspecified intestinal obstruction, unspecified as to partial versus complete obstruction: Secondary | ICD-10-CM

## 2017-05-18 DIAGNOSIS — E039 Hypothyroidism, unspecified: Secondary | ICD-10-CM

## 2017-05-18 DIAGNOSIS — W19XXXD Unspecified fall, subsequent encounter: Secondary | ICD-10-CM | POA: Diagnosis not present

## 2017-05-18 DIAGNOSIS — I10 Essential (primary) hypertension: Secondary | ICD-10-CM

## 2017-05-18 NOTE — Patient Instructions (Signed)
Follow up in 1 months  Gets labs prior to your office visit, 1-2 days before   Call if you need anything.  Continue all of your medication.

## 2017-05-18 NOTE — Telephone Encounter (Signed)
Kennyth Arnold, Physical Therapist with [facility], left message on nurse line regarding patient. She states she went to do a physical therapy evaluation for patient on Tuesday and is wanting a verbal order to see patient twice a week for three weeks.   Callback# [phone]

## 2017-05-18 NOTE — Progress Notes (Signed)
Patient ID: ANDRA HESLIN, female    DOB: 1932-12-14, 84 y.o.   MRN: [id number]  Chief Complaint  Patient presents with  . Follow-up    Allergies Penicillins; Hydrocodone; and Morphine and related  Subjective:   CHARNESE FEDERICI is a 84 y.o. female who presents to [facility] today.  HPI Mrs. Tipps presents today for transition of care visit/ follow up.  She was admitted to the hospital on May 03, 2017 and discharged on May 09, 2017.  She was admitted from our office secondary to abdominal pain, nausea, and vomiting with associated dehydration.  She was subsequently found to have a small bowel obstruction.  Her hospital course was complicated by bronchitis which she was treated with Zithromax.  In addition while she was in the hospital she did sustain a fall because she tripped on over the IV pole.  She did fracture her rib with this fall.  She was discharged home to complete Cipro and Zithromax, with home health services, and to resume her usual home medications.  She presents today with her daughter-in-law Cleotilde Spadaccini.  She is using a walker for assistance.  She has lost 6 pounds since she was last in our office.  She reports that she is doing much better and gaining her strength back.  She is eating meals multiple times a day without difficulty.  She denies any abdominal pain.  Her bowel movements are regular.  She reports that she is taking stool softner. Eating food, eating well. Use colace each days if needed.   She has been using her walker to get around the house.  This is her first outing since having been in the hospital.  She does have in-home care that helps with her needs.  She was evaluated by physical therapy and occupational therapy in the home and not felt to meet needs for services.  She reports that she feels well.  Energy level is improved.  She denies any chest pain, shortness of breath, or swelling in her extremities.  She denies any pain with  swallowing.  She has had no subsequent episodes of vomiting.  She is back on her blood pressure medication.  She has continued her thyroid medication, and has not had any problems.  She is still using the Emsam patch for her mood disorder.  She reports that her mood is good.  She feels upbeat.  She does not feel depressed or down at this time.    Past Medical History:  Diagnosis Date  . Anxiety   . Bipolar disorder (HCC)   . Hyperlipidemia   . Hypothyroidism   . Irritable bowel syndrome   . RA (rheumatoid arthritis) (HCC)     Past Surgical History:  Procedure Laterality Date  . ABDOMINAL HYSTERECTOMY    . Bone Spurs  08/07/13   Right Shoulder  . COLONOSCOPY    . COLONOSCOPY N/A 05/08/2012   Procedure: COLONOSCOPY;  Surgeon: Malissa Hippo, MD;  Location: [facility];  Service: Endoscopy;  Laterality: N/A;  730  . COLOSTOMY    . ESOPHAGOGASTRODUODENOSCOPY N/A 11/04/2013   Procedure: ESOPHAGOGASTRODUODENOSCOPY (EGD);  Surgeon: Malissa Hippo, MD;  Location: [facility];  Service: Endoscopy;  Laterality: N/A;  730  . EYE SURGERY  cataract x 2  . MALONEY DILATION N/A 11/04/2013   Procedure: Elease Hashimoto DILATION;  Surgeon: Malissa Hippo, MD;  Location: [facility];  Service: Endoscopy;  Laterality: N/A;  . UPPER GASTROINTESTINAL ENDOSCOPY  Family History  Problem Relation Age of Onset  . Colon cancer Mother   . Bipolar disorder Mother   . Anxiety disorder Mother   . Atrial fibrillation Son   . Depression Maternal Grandmother      Social History   Socioeconomic History  . Marital status: Married    Spouse name: None  . Number of children: None  . Years of education: None  . Highest education level: None  Social Needs  . Financial resource strain: None  . Food insecurity - worry: None  . Food insecurity - inability: None  . Transportation needs - medical: None  . Transportation needs - non-medical: None  Occupational History  . None  Tobacco Use  . Smoking  status: Former Smoker    Packs/day: 1.50    Years: 27.00    Pack years: 40.50    Types: Cigarettes    Last attempt to quit: 04/18/1981    Years since quitting: 36.1  . Smokeless tobacco: Former Neurosurgeon    Quit date: 11/27/1980  Substance and Sexual Activity  . Alcohol use: No    Alcohol/week: 0.0 oz  . Drug use: No  . Sexual activity: No    Partners: Male  Other Topics Concern  . None  Social History Narrative  . None   Current Outpatient Medications on File Prior to Visit  Medication Sig Dispense Refill  . acetaminophen (TYLENOL) 650 MG CR tablet Take 1,300 mg by mouth daily.    Marland Kitchen amLODipine (NORVASC) 2.5 MG tablet Take 1 tablet (2.5 mg total) by mouth 2 (two) times daily. 60 tablet 6  . Cyanocobalamin (VITAMIN B-12 PO) Take by mouth daily.    Marland Kitchen docusate sodium (COLACE) 100 MG capsule Take 1 capsule (100 mg total) by mouth 2 (two) times daily. 10 capsule 0  . Ibuprofen 200 MG CAPS Take 1 capsule (200 mg total) by mouth 3 (three) times daily as needed (pain).  0  . levothyroxine (SYNTHROID, LEVOTHROID) 75 MCG tablet Take 1 tablet (75 mcg total) by mouth daily. Please dispense brand only thyroid medication/Synthroid. 30 tablet 2  . LORazepam (ATIVAN) 0.5 MG tablet Take 1/2 to 1 tablet by mouth twice a day as needed 60 tablet 1  . metoprolol tartrate (LOPRESSOR) 25 MG tablet Take 1 tablet (25 mg total) by mouth 2 (two) times daily. 60 tablet 0  . NON FORMULARY CBD OIL - 3 DROPS TOPICAL    . pantoprazole (PROTONIX) 40 MG tablet TAKE ONE TABLET BY MOUTH DAILY BEFORE BREAKFAST. 30 tablet 5  . selegiline (EMSAM) 6 MG/24HR Place 1 patch (6 mg total) onto the skin daily. 30 patch 12  . azithromycin (ZITHROMAX) 250 MG tablet Take 2 tabs PO x 1 dose, then 1 tab PO QD x 4 days (Patient not taking: Reported on 05/18/2017) 6 tablet 0   No current facility-administered medications on file prior to visit.     Review of Systems  Constitutional: Negative for activity change, appetite change, fever and  unexpected weight change.  HENT: Negative for ear discharge, postnasal drip, sore throat, trouble swallowing and voice change.        Has had a little bit of a runny nose and intermittent congestion since coming home.  Denies any sinus pain or facial pain.  Eyes: Negative for visual disturbance.  Respiratory: Negative for cough, chest tightness and shortness of breath.   Cardiovascular: Negative for chest pain, palpitations and leg swelling.  Gastrointestinal: Negative for abdominal distention, abdominal pain, blood in  stool, constipation, diarrhea, nausea, rectal pain and vomiting.  Genitourinary: Negative for dysuria, frequency and urgency.  Musculoskeletal: Negative for myalgias and neck stiffness.       She reports that the pain with the rib fracture has improved.  She is continuing to use the bedside spirometer multiple times a day.  She is not requiring any medications for the pain.  Skin: Negative for rash.  Neurological: Negative for dizziness, tremors, syncope, weakness, light-headedness and headaches.  Hematological: Negative for adenopathy. Does not bruise/bleed easily.  Psychiatric/Behavioral: Negative for agitation, behavioral problems, dysphoric mood and sleep disturbance. The patient is not nervous/anxious.      Objective:   BP 138/88 (BP Location: Left Arm, Patient Position: Sitting, Cuff Size: Normal)   Pulse 78   Temp 98.4 F (36.9 C) (Temporal)   Resp 16   Ht 5\' 3"  (1.6 m)   Wt 115 lb 4 oz (52.3 kg)   SpO2 98%   BMI 20.42 kg/m   Physical Exam  Constitutional: She is oriented to person, place, and time. She appears well-developed and well-nourished. No distress.  HENT:  Head: Normocephalic and atraumatic.  Well looking elderly female.  Appearance younger than her stated age.  Sitting in exam room.  Pleasant.  Answers questions appropriately.  Eyes: Pupils are equal, round, and reactive to light.  Neck: Normal range of motion. Neck supple. No thyromegaly present.    Cardiovascular: Normal rate, regular rhythm and normal heart sounds.  Pulmonary/Chest: Effort normal and breath sounds normal. No respiratory distress.  Abdominal: Soft. Bowel sounds are normal. She exhibits no distension. There is no tenderness. There is no rebound and no guarding.  Musculoskeletal: Normal range of motion. She exhibits no edema.  Neurological: She is alert and oriented to person, place, and time. No cranial nerve deficit.  Skin: Skin is warm and dry. Capillary refill takes less than 2 seconds.  Psychiatric: She has a normal mood and affect. Her behavior is normal. Judgment and thought content normal.  Nursing note and vitals reviewed.    Assessment and Plan  . 1. Small bowel obstruction (HCC)  Patient doing well since discharge from hospital.  Continuing to use stool softener, have good in take of fluid, and use fiber supplementation as directed.  At this time there is no necessary intervention needed.  We will continue to monitor.  She was told to call with any questions, concerns, or worrisome symptoms. 2. Hypothyroidism, unspecified type Continue lower dose of levothyroxine.  She is on 75 mcg of branded Synthroid a day.  We will plan to recheck her levels in 1 month.  3. Essential hypertension Blood pressures well controlled on her medications.  Continue dosing as directed.  4. Fall, subsequent encounter Patient is status post fall in the hospital with rib fracture.  Recovering well.  Has been evaluated by physical therapy and occupational therapy. Continue spirometer as recommended to prevent atelectasis secondary to possible splinting with breathing secondary to rib fracture. Continued use of assistive ambulatory devices recommended as needed until patient is back to her baseline. Fall prevention discussed.  Call with any questions, concerns, or worrisome symptoms.  Keep scheduled follow-up.  Hospital records were reviewed.  She will return to the lab 1-2 days  prior to her office visit to check her labs so that we can discuss at follow-up.  Lab orders placed today. Return in about 4 weeks (around 06/15/2017). Aliene Beams, MD 05/19/2017

## 2017-05-19 NOTE — Telephone Encounter (Signed)
Stacy informed-verbal order given.

## 2017-05-19 NOTE — Telephone Encounter (Signed)
Please give verbal order that the services would be greatly appreciated.Janine Limbo. Tracie Harrier, MD

## 2017-05-26 ENCOUNTER — Telehealth: Payer: Self-pay | Admitting: Family Medicine

## 2017-05-26 ENCOUNTER — Ambulatory Visit: Payer: Self-pay | Admitting: Family Medicine

## 2017-05-26 DIAGNOSIS — S2231XD Fracture of one rib, right side, subsequent encounter for fracture with routine healing: Secondary | ICD-10-CM | POA: Diagnosis not present

## 2017-05-26 MED ORDER — POLYETHYLENE GLYCOL 3350 17 GM/SCOOP PO POWD: 17.0000 g | Freq: Two times a day (BID) | ORAL | 1 refills | Status: DC | PRN

## 2017-05-26 NOTE — Telephone Encounter (Signed)
Please call patient and advised that I would recommend her start MiraLAX 17 g p.o. daily as directed.  I would like for her to start this once a day.  If she is developed him being abdominal pain, more distention, nausea, or any vomiting she would need to be seen at the emergency department.  She is at risk for a subsequent obstruction due to her history and recent hospitalization.  Please let us know if she is having any worrisome symptoms at all.

## 2017-05-26 NOTE — Telephone Encounter (Signed)
Patient informed of message below, verbalized understanding.  

## 2017-05-26 NOTE — Telephone Encounter (Signed)
Patient states she is on her 2nd day with no bowel movements. She is taking 3 Colace a day. Denies any nausea/vomiting, no abdominal pain. She states she is drinking 3-4 bottles of water a day. She is eating a healthy breakfast, sandwich at lunch and a full dinner. She states she feels distended, stomach is 'certainly not as soft as it has been.' But it is not as bad as it was last time.  Is passing some gas. NO gas pains.

## 2017-05-30 ENCOUNTER — Ambulatory Visit (INDEPENDENT_AMBULATORY_CARE_PROVIDER_SITE_OTHER): Payer: Medicare Other | Admitting: Internal Medicine

## 2017-05-30 ENCOUNTER — Encounter (INDEPENDENT_AMBULATORY_CARE_PROVIDER_SITE_OTHER): Payer: Self-pay | Admitting: Internal Medicine

## 2017-05-30 VITALS — BP 112/70 | HR 106 | Temp 97.9°F | Resp 18 | Ht 62.0 in | Wt 116.8 lb

## 2017-05-30 DIAGNOSIS — K219 Gastro-esophageal reflux disease without esophagitis: Secondary | ICD-10-CM | POA: Diagnosis not present

## 2017-05-30 DIAGNOSIS — K649 Unspecified hemorrhoids: Secondary | ICD-10-CM

## 2017-05-30 DIAGNOSIS — Z8719 Personal history of other diseases of the digestive system: Secondary | ICD-10-CM

## 2017-05-30 MED ORDER — HYDROCORTISONE 2.5 % RE CREA
1.0000 | TOPICAL_CREAM | Freq: Two times a day (BID) | RECTAL | 1 refills | Status: DC
Start: 2017-05-30 — End: 2017-07-25

## 2017-05-30 MED ORDER — BISACODYL 10 MG RE SUPP: 10.0000 mg | RECTAL | 2 refills | Status: DC | PRN

## 2017-05-30 NOTE — Patient Instructions (Signed)
Use Dulcolax suppository on as-needed basis when he has a sense of incomplete evacuation or every other day. Can titrate polyethylene glycol dose up to 34 g/day. Stool diary as to frequency and consistency of stools and Dulcolax suppository use until office visit in 8 weeks.

## 2017-05-30 NOTE — Progress Notes (Signed)
Presenting complaint;  Recent hospitalization for small bowel obstruction.  Database and subjective:  Erica [name] is 84 year old Caucasian female who has chronic GERD and history of IBS with diarrhea.  She also has history of diverticulitis with obstruction for which she underwent sigmoid colon resection with colostomy in November 97.  Colostomy was subsequently taken down few months later.  She has been undergoing high-risk screening colonoscopies every 5 years.  Last exam was in February 2014 after she had an episode of rectal bleeding and left-sided abdominal pain.  She was diagnosed with ischemic colitis.  Stool studies were negative.  Colonic anastomosis was wide open with few diverticula.  She was hospitalized in December 2016 for small bowel obstruction and responded to medical therapy.  She was hospitalized On 05/03/2017 for SBO and responded to medical therapy.  She was discharged on 05/09/2017.  Patient recalls that she was having constipation prior to her hospitalization .  She had been on low-dose Imodium for diarrhea but she has not taken this medicine since her hospitalization last month.  She is not having difficulty with her bowel movements.  She was begun on polyethylene glycol by Dr. Tracie Harrier.  She is having soft stools however she does not have sense of good evacuation.  She is having 3-4 stools per week.  She is trying to gradually increase fiber in her diet.  She denies melena or rectal bleeding.  She is also not having nausea or vomiting.  She is not taking pantoprazole every other day and she feels is controlling her heartburn well.  She did have heartburn and regurgitation just prior to hospitalization for SBO.   She is also having some discomfort due to her hemorrhoids and would like to be prescribed some cream.    Current Medications: Outpatient Encounter Medications as of 05/30/2017  Medication Sig  . acetaminophen (TYLENOL) 650 MG CR tablet Take 1,300 mg by mouth daily.  Marland Kitchen  amLODipine (NORVASC) 2.5 MG tablet Take 1 tablet (2.5 mg total) by mouth 2 (two) times daily.  . Cyanocobalamin (VITAMIN B-12 PO) Take by mouth daily.  Marland Kitchen docusate sodium (COLACE) 100 MG capsule Take 1 capsule (100 mg total) by mouth 2 (two) times daily.  Marland Kitchen levothyroxine (SYNTHROID, LEVOTHROID) 75 MCG tablet Take 1 tablet (75 mcg total) by mouth daily. Please dispense brand only thyroid medication/Synthroid.  Marland Kitchen LORazepam (ATIVAN) 0.5 MG tablet Take 1/2 to 1 tablet by mouth twice a day as needed (Patient taking differently: at bedtime as needed. Take 1/2 to 1 tablet by mouth twice a day as needed)  . NON FORMULARY as needed. CBD OIL - 3 DROPS TOPICAL   . pantoprazole (PROTONIX) 40 MG tablet TAKE ONE TABLET BY MOUTH DAILY BEFORE BREAKFAST. (Patient taking differently: 40 mg. TAKE ONE TABLET BY MOUTH DAILY BEFORE BREAKFAST.)  . polyethylene glycol powder (GLYCOLAX/MIRALAX) powder Take 17 g by mouth 2 (two) times daily as needed.  . selegiline (EMSAM) 6 MG/24HR Place 1 patch (6 mg total) onto the skin daily.  . [DISCONTINUED] amLODipine (NORVASC) 2.5 MG tablet Take 1 tablet (2.5 mg total) by mouth daily.  . [DISCONTINUED] azithromycin (ZITHROMAX) 250 MG tablet Take 2 tabs PO x 1 dose, then 1 tab PO QD x 4 days (Patient not taking: Reported on 05/18/2017)  . [DISCONTINUED] folic acid (FOLVITE) 1 MG tablet Take 1 mg by mouth at bedtime.   . [DISCONTINUED] gabapentin (NEURONTIN) 100 MG capsule Take 100 mg by mouth 3 (three) times daily.   . [DISCONTINUED] Ibuprofen 200 MG  CAPS Take 1 capsule (200 mg total) by mouth 3 (three) times daily as needed (pain). (Patient not taking: Reported on 05/30/2017)  . [DISCONTINUED] levothyroxine (SYNTHROID, LEVOTHROID) 88 MCG tablet Take 88 mcg by mouth daily before breakfast.   . [DISCONTINUED] metoprolol tartrate (LOPRESSOR) 25 MG tablet Take 1 tablet (25 mg total) by mouth 2 (two) times daily. (Patient not taking: Reported on 05/30/2017)   No facility-administered encounter  medications on file as of 05/30/2017.     Objective: Blood pressure 112/70, pulse (!) 106, temperature 97.9 F (36.6 C), temperature source Oral, resp. rate 18, height 5\' 2"  (1.575 m), weight 116 lb 12.8 oz (53 kg). Patient is alert and in no acute distress. Conjunctiva is pink. Sclera is nonicteric Oropharyngeal mucosa is normal. No neck masses or thyromegaly noted. Cardiac exam with regular rhythm normal S1 and S2. No murmur or gallop noted. Lungs are clear to auscultation. Abdomen is symmetrical.  She has midline scar along with scarring left lower quadrant of her abdomen.  Bowel sounds are normal.  On palpation abdomen is soft.  She has fullness in left lower quadrant this area is nontender.  No organomegaly noted.  Rectal examination reveals small sentinel skin tags and she has moderate amount of soft stool in the vault and it is guaiac negative. No LE edema or clubbing noted.  She has bilateral Dupuytren contractures right greater than left. She also has ulnar deviation and expansion to MCP joints in both hands.  Labs/studies Results:  Abdominopelvic CT images from 05/03/2017 reviewed with patient. Distal esophageal wall thickening with fluid-filled esophagus small sliding hiatal hernia distended stomach containing fluid.  Dilated loops of small bowel with a transition zone in normal caliber small bowel distal to it.  The colon was also not dilated.  Metallic clips noted in left lower quadrant site of previous colonic surgery.  Assessment:  #1.  Recent bout of small bowel obstruction responding to medical therapy.  She had similar episode in December 2016 and responded to medical therapy.  She appears to have adhesive disease.  I wonder if small bowel obstruction was triggered by constipation.  She used to have diarrhea with IBS and now she is having constipation.  #2.  GERD.  She has chronic GERD, complicated by esophageal stricture which was dilated in August 2015 and she has not had  dysphagia anymore.  Recent CT abnormality would appear to be secondary to small bowel obstruction and not primary esophageal process.  GERD symptoms are well controlled with PPI every other day and it will be continued at this dose.  #3. History of hemorrhoids.  She is having some discomfort secondary to constipation.   Plan:  Patient can increase polyethylene glycol dose to 17 g p.o. twice daily or take 8.5 g in a.m. and 17 g in p.m. She should gradually increase fiber intake as tolerated. Patient advised to use Dulcolax suppository every other day or when she has poor evacuation.   Patient will keep stool diary until office visit in 8 weeks.   ProctoCream HC 2.5% to be applied to anal canal twice daily for 1-2 weeks and thereafter on as-needed basis. Office visit in 8 weeks.

## 2017-06-09 ENCOUNTER — Other Ambulatory Visit: Payer: Self-pay | Admitting: Family Medicine

## 2017-06-21 ENCOUNTER — Ambulatory Visit: Payer: Medicare Other | Admitting: Family Medicine

## 2017-06-21 ENCOUNTER — Other Ambulatory Visit: Payer: Self-pay

## 2017-06-21 ENCOUNTER — Encounter: Payer: Self-pay | Admitting: Family Medicine

## 2017-06-21 ENCOUNTER — Telehealth: Payer: Self-pay

## 2017-06-21 VITALS — BP 138/60 | HR 78 | Temp 98.1°F | Resp 16 | Ht 62.0 in | Wt 117.5 lb

## 2017-06-21 DIAGNOSIS — I10 Essential (primary) hypertension: Secondary | ICD-10-CM | POA: Diagnosis not present

## 2017-06-21 DIAGNOSIS — K56609 Unspecified intestinal obstruction, unspecified as to partial versus complete obstruction: Secondary | ICD-10-CM

## 2017-06-21 DIAGNOSIS — K5900 Constipation, unspecified: Secondary | ICD-10-CM | POA: Diagnosis not present

## 2017-06-21 DIAGNOSIS — E039 Hypothyroidism, unspecified: Secondary | ICD-10-CM | POA: Diagnosis not present

## 2017-06-21 NOTE — Telephone Encounter (Signed)
Lanette with [facility] in [facility] calling to see if the PT home health order has been signed by Dr. Tracie Harrier. Start date was 05/15/17. If she needs to, she can refax the order. Her phone number is [phone] ext [phone]

## 2017-06-21 NOTE — Progress Notes (Signed)
Patient ID: Erica [name], female    DOB: 1932-09-21, 84 y.o.   MRN: [id number]  Chief Complaint  Patient presents with  . Follow-up    Allergies Penicillins; Hydrocodone; and Morphine and related  Subjective:   Erica [name] is a 84 y.o. female who presents to [facility] today.  HPI Ms. Brougher presents for follow-up today.  She is accompanied today by her daughter-in-law.  She did not get her labs before her visit today.  She has been seen by Dr. Karilyn Cota since she was last here, she reports she really does not know what he recommended at that visit.  Her daughter-in-law did not accompany her to the office visit.  She does not remember his recommendations.  She has not been using the MiraLAX on a daily basis.  She reports that she read the bottle and it stated should not be used more than 7 days in a row.  She has been using a laxative every other day if she does not go to the bathroom.  She reports she is still struggling with constipation.  Reports she is drinking a good bit of water on a daily basis but does not quantify her intake.  She is eating a normal appetite.  She denies any abdominal pain.  Reports that she occasionally uses Colace as a stool softener.  Patient reports that she had felt a bit constipated so she used a laxative.  She reports that now because the laxative she has been having looser stools and been going more frequently.  She reports that she has had multiple bowel movements today.  She reports that when she goes to the bathroom so frequently that it makes her feel tired and weak.  She denies diarrhea but reports that the stools are looser.  She denies any melena or bright red blood per rectum.  She is urinating normal.  She denies any fevers.  She reports that her belly is not sore.  She has not had any nausea or vomiting.  She has an upcoming appointment with Dr. Karilyn Cota.   She reports that her blood pressure is been running well.  She is taking her  medications as directed.  She has recently been seen by her psychiatrist and there were no changes in her medication.  She reports her mood is stable.  She does have lots of stress at home and dealing with her husband who is suffering from Alzheimer's.  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  She does have a caretaker for her husband.   Past Medical History:  Diagnosis Date  . Anxiety   . Bipolar disorder (HCC)   . Hyperlipidemia   . Hypothyroidism   . Irritable bowel syndrome   . RA (rheumatoid arthritis) (HCC)     Past Surgical History:  Procedure Laterality Date  . ABDOMINAL HYSTERECTOMY    . Bone Spurs  08/07/13   Right Shoulder  . COLONOSCOPY    . COLONOSCOPY N/A 05/08/2012   Procedure: COLONOSCOPY;  Surgeon: Malissa Hippo, MD;  Location: [facility];  Service: Endoscopy;  Laterality: N/A;  730  . COLOSTOMY    . ESOPHAGOGASTRODUODENOSCOPY N/A 11/04/2013   Procedure: ESOPHAGOGASTRODUODENOSCOPY (EGD);  Surgeon: Malissa Hippo, MD;  Location: [facility];  Service: Endoscopy;  Laterality: N/A;  730  . EYE SURGERY  cataract x 2  . MALONEY DILATION N/A 11/04/2013   Procedure: MALONEY DILATION;  Surgeon: Malissa Hippo,  MD;  Location: [facility];  Service: Endoscopy;  Laterality: N/A;  . UPPER GASTROINTESTINAL ENDOSCOPY      Family History  Problem Relation Age of Onset  . Colon cancer Mother   . Bipolar disorder Mother   . Anxiety disorder Mother   . Atrial fibrillation Son   . Depression Maternal Grandmother      Social History   Socioeconomic History  . Marital status: Married    Spouse name: Not on file  . Number of children: Not on file  . Years of education: Not on file  . Highest education level: Not on file  Occupational History  . Not on file  Social Needs  . Financial resource strain: Not on file  . Food insecurity:    Worry: Not on file    Inability: Not on file  . Transportation needs:    Medical: Not on file      Non-medical: Not on file  Tobacco Use  . Smoking status: Former Smoker    Packs/day: 1.50    Years: 27.00    Pack years: 40.50    Types: Cigarettes    Last attempt to quit: 04/18/1981    Years since quitting: 36.2  . Smokeless tobacco: Former Neurosurgeon    Quit date: 11/27/1980  Substance and Sexual Activity  . Alcohol use: No    Alcohol/week: 0.0 oz  . Drug use: No  . Sexual activity: Never    Partners: Male  Lifestyle  . Physical activity:    Days per week: Not on file    Minutes per session: Not on file  . Stress: Not on file  Relationships  . Social connections:    Talks on phone: Not on file    Gets together: Not on file    Attends religious service: Not on file    Active member of club or organization: Not on file    Attends meetings of clubs or organizations: Not on file    Relationship status: Not on file  Other Topics Concern  . Not on file  Social History Narrative  . Not on file    Review of Systems  Constitutional: Positive for fatigue. Negative for activity change, appetite change and fever.  Eyes: Negative for visual disturbance.  Respiratory: Negative for cough, chest tightness and shortness of breath.   Cardiovascular: Negative for chest pain, palpitations and leg swelling.  Gastrointestinal: Positive for constipation. Negative for abdominal pain, nausea and vomiting.  Genitourinary: Negative for dysuria, frequency and urgency.  Neurological: Negative for dizziness, syncope and light-headedness.  Hematological: Negative for adenopathy.     Objective:   BP 138/60 (BP Location: Left Arm, Patient Position: Sitting, Cuff Size: Normal)   Pulse 78   Temp 98.1 F (36.7 C) (Temporal)   Resp 16   Ht 5\' 2"  (1.575 m)   Wt 117 lb 8 oz (53.3 kg)   SpO2 98%   BMI 21.49 kg/m   Physical Exam  Constitutional: She is oriented to person, place, and time. She appears well-developed and well-nourished. No distress.  HENT:  Head: Normocephalic and atraumatic.   Mouth/Throat: No oropharyngeal exudate.  Eyes: Pupils are equal, round, and reactive to light. No scleral icterus.  Neck: Normal range of motion. Neck supple. No JVD present. No thyromegaly present.  Cardiovascular: Normal rate, regular rhythm and normal heart sounds.  Pulmonary/Chest: Effort normal and breath sounds normal. No respiratory distress.  Abdominal: Soft. Bowel sounds are normal. She exhibits no distension. There is no  tenderness. There is no rebound and no guarding.  Lymphadenopathy:    She has no cervical adenopathy.  Neurological: She is alert and oriented to person, place, and time. No cranial nerve deficit.  Skin: Skin is warm and dry.  Psychiatric: She has a normal mood and affect. Her behavior is normal. Judgment and thought content normal.  Nursing note and vitals reviewed.    Assessment and Plan  1. Essential hypertension Blood pressures well controlled.  Continue current medications as directed.  2. Hypothyroidism, unspecified type We will check TSH today.  Labs already ordered.  Adjust thyroid as needed.  Dose had been decreased and patient had tolerated change well.  Her heart rate had decreased, she had previously been tachycardic with dose.  She is on brand only Synthroid.  3. Constipation in female Patient with history of recent hospitalization for small bowel obstruction, able to be medically managed.  Thought to be secondary to adhesions and constipation.  Was doing well but there is been some confusion over her medications.  She is currently not on the MiraLAX and has been using a stimulant laxative on an as-needed basis.  Note from gastroenterology reviewed with patient and her daughter-in-law today.  She will restart the MiraLAX at 17 g p.o. daily.  She was counseled that she can continue to take this medication on a daily basis.  If her bowel movements are not improved after 1-2 weeks, she will increase to 17 g p.o. every morning and half dose in the evening.   She was told that she can continue to use the stool softener.  As per Dr. Inge Rise recommendation if she does not have a bowel movement every 3 days she can use an enema.  I have asked her to refrain using the pill stimulant laxatives due to the symptoms that occur when she uses this.  She was counseled if she develops any abdominal pain, does not have a bowel movement in 3 days, or has any worrisome signs and symptoms to please call our office or contact her GI doctor.  She voiced understanding. 4. Small bowel obstruction (HCC) Thought to be due secondary to adhesions/exacerbated by constipation.  Bowel management necessary to prevent future hospitalizations.    Continue regularly scheduled visits with psychiatry.  Continue medications as directed.  Call with any questions or concerns.  Check labs today. Return in about 3 months (around 09/20/2017) for follow  up. Aliene Beams, MD 06/22/2017

## 2017-06-21 NOTE — Patient Instructions (Signed)
Start the Miralax 17 grams a day for 1 week and see if gets better. If still feeling constipated then increase the Miralax to twice a day.  Make sure the colace is just ducosate sodium. -Stool softner.

## 2017-06-21 NOTE — Telephone Encounter (Signed)
Left message to refax 

## 2017-06-22 ENCOUNTER — Encounter: Payer: Self-pay | Admitting: Family Medicine

## 2017-06-22 DIAGNOSIS — K5900 Constipation, unspecified: Secondary | ICD-10-CM | POA: Insufficient documentation

## 2017-06-22 LAB — CBC WITH DIFFERENTIAL/PLATELET
BASOS ABS: 32 {cells}/uL (ref 0–200)
Basophils Relative: 0.6 %
EOS PCT: 0.6 %
Eosinophils Absolute: 32 cells/uL (ref 15–500)
HCT: 39.9 % (ref 35.0–45.0)
HEMOGLOBIN: 13.7 g/dL (ref 11.7–15.5)
Lymphs Abs: 2353 cells/uL (ref 850–3900)
MCH: 32.4 pg (ref 27.0–33.0)
MCHC: 34.3 g/dL (ref 32.0–36.0)
MCV: 94.3 fL (ref 80.0–100.0)
MPV: 10.9 fL (ref 7.5–12.5)
Monocytes Relative: 14.4 %
NEUTROS ABS: 2120 {cells}/uL (ref 1500–7800)
NEUTROS PCT: 40 %
Platelets: 216 10*3/uL (ref 140–400)
RBC: 4.23 10*6/uL (ref 3.80–5.10)
RDW: 12.1 % (ref 11.0–15.0)
Total Lymphocyte: 44.4 %
WBC mixed population: 763 cells/uL (ref 200–950)
WBC: 5.3 10*3/uL (ref 3.8–10.8)

## 2017-06-22 LAB — BASIC METABOLIC PANEL
BUN: 17 mg/dL (ref 7–25)
CALCIUM: 9.3 mg/dL (ref 8.6–10.4)
CO2: 27 mmol/L (ref 20–32)
Chloride: 103 mmol/L (ref 98–110)
Creat: 0.69 mg/dL (ref 0.60–0.88)
Glucose, Bld: 90 mg/dL (ref 65–139)
POTASSIUM: 4 mmol/L (ref 3.5–5.3)
Sodium: 138 mmol/L (ref 135–146)

## 2017-06-22 LAB — TSH: TSH: 3.72 m[IU]/L (ref 0.40–4.50)

## 2017-06-28 ENCOUNTER — Telehealth: Payer: Self-pay | Admitting: Family Medicine

## 2017-06-28 NOTE — Telephone Encounter (Signed)
Verner Mould [facility], left message on nurse line regarding patient. She states that the patient had a fracture in February 2019 and because of that, she now has an open gap in care. In order to close that gap, she either needs osteoporosis treatment, or a bone mineral density test performed every 2 years.   Callback# [phone]

## 2017-06-29 NOTE — Telephone Encounter (Signed)
Please call patient and ask if she would like to have a bone density testing done due to the fact that she had a fracture while she was in the hospital. If she does, we can go ahead and order, otherwise, we can discuss this at her next follow up. Erica [name]. Tracie Harrier, MD

## 2017-07-03 DIAGNOSIS — M5442 Lumbago with sciatica, left side: Secondary | ICD-10-CM | POA: Diagnosis not present

## 2017-07-03 DIAGNOSIS — M9903 Segmental and somatic dysfunction of lumbar region: Secondary | ICD-10-CM | POA: Diagnosis not present

## 2017-07-03 DIAGNOSIS — M9905 Segmental and somatic dysfunction of pelvic region: Secondary | ICD-10-CM | POA: Diagnosis not present

## 2017-07-03 DIAGNOSIS — M9902 Segmental and somatic dysfunction of thoracic region: Secondary | ICD-10-CM | POA: Diagnosis not present

## 2017-07-03 NOTE — Telephone Encounter (Signed)
Called patient regarding message below. No answer, left generic message for patient to return call.   

## 2017-07-04 NOTE — Telephone Encounter (Signed)
Patient states she will wait until she comes in for follow up

## 2017-07-06 DIAGNOSIS — M9905 Segmental and somatic dysfunction of pelvic region: Secondary | ICD-10-CM | POA: Diagnosis not present

## 2017-07-06 DIAGNOSIS — M9903 Segmental and somatic dysfunction of lumbar region: Secondary | ICD-10-CM | POA: Diagnosis not present

## 2017-07-06 DIAGNOSIS — M9902 Segmental and somatic dysfunction of thoracic region: Secondary | ICD-10-CM | POA: Diagnosis not present

## 2017-07-06 DIAGNOSIS — M5442 Lumbago with sciatica, left side: Secondary | ICD-10-CM | POA: Diagnosis not present

## 2017-07-10 DIAGNOSIS — M5442 Lumbago with sciatica, left side: Secondary | ICD-10-CM | POA: Diagnosis not present

## 2017-07-10 DIAGNOSIS — M9903 Segmental and somatic dysfunction of lumbar region: Secondary | ICD-10-CM | POA: Diagnosis not present

## 2017-07-10 DIAGNOSIS — M9905 Segmental and somatic dysfunction of pelvic region: Secondary | ICD-10-CM | POA: Diagnosis not present

## 2017-07-10 DIAGNOSIS — M9902 Segmental and somatic dysfunction of thoracic region: Secondary | ICD-10-CM | POA: Diagnosis not present

## 2017-07-11 ENCOUNTER — Ambulatory Visit (INDEPENDENT_AMBULATORY_CARE_PROVIDER_SITE_OTHER): Payer: Self-pay | Admitting: Internal Medicine

## 2017-07-14 DIAGNOSIS — M5442 Lumbago with sciatica, left side: Secondary | ICD-10-CM | POA: Diagnosis not present

## 2017-07-14 DIAGNOSIS — M9905 Segmental and somatic dysfunction of pelvic region: Secondary | ICD-10-CM | POA: Diagnosis not present

## 2017-07-14 DIAGNOSIS — M9902 Segmental and somatic dysfunction of thoracic region: Secondary | ICD-10-CM | POA: Diagnosis not present

## 2017-07-14 DIAGNOSIS — M9903 Segmental and somatic dysfunction of lumbar region: Secondary | ICD-10-CM | POA: Diagnosis not present

## 2017-07-18 DIAGNOSIS — M9903 Segmental and somatic dysfunction of lumbar region: Secondary | ICD-10-CM | POA: Diagnosis not present

## 2017-07-18 DIAGNOSIS — M5442 Lumbago with sciatica, left side: Secondary | ICD-10-CM | POA: Diagnosis not present

## 2017-07-18 DIAGNOSIS — M9905 Segmental and somatic dysfunction of pelvic region: Secondary | ICD-10-CM | POA: Diagnosis not present

## 2017-07-18 DIAGNOSIS — M9902 Segmental and somatic dysfunction of thoracic region: Secondary | ICD-10-CM | POA: Diagnosis not present

## 2017-07-25 ENCOUNTER — Ambulatory Visit (INDEPENDENT_AMBULATORY_CARE_PROVIDER_SITE_OTHER): Payer: Medicare Other | Admitting: Internal Medicine

## 2017-07-25 ENCOUNTER — Ambulatory Visit (INDEPENDENT_AMBULATORY_CARE_PROVIDER_SITE_OTHER): Payer: Self-pay | Admitting: Internal Medicine

## 2017-07-25 ENCOUNTER — Encounter (INDEPENDENT_AMBULATORY_CARE_PROVIDER_SITE_OTHER): Payer: Self-pay | Admitting: Internal Medicine

## 2017-07-25 VITALS — BP 132/76 | HR 98 | Temp 97.6°F | Resp 18 | Ht 62.0 in | Wt 118.5 lb

## 2017-07-25 DIAGNOSIS — Z8719 Personal history of other diseases of the digestive system: Secondary | ICD-10-CM

## 2017-07-25 DIAGNOSIS — K589 Irritable bowel syndrome without diarrhea: Secondary | ICD-10-CM | POA: Diagnosis not present

## 2017-07-25 NOTE — Patient Instructions (Signed)
Call if MiraLAX stops working

## 2017-07-25 NOTE — Progress Notes (Signed)
Presenting complaint;  Follow-up for IBS-C and history of small bowel obstruction.  Database and subjective:  Patient is 84 year old Caucasian female who has history of IBS with chronic diarrhea as well as history of GERD.  She was hospitalized in February this year for small bowel obstruction and responded to medical therapy. She was last seen on 05/30/2017.  Since her recent hospitalization she has been constipated.  She was advised to continue MiraLAX. She is advised to call office if abdominal pain recurs. She says she is feeling much better.  She has not had any abdominal pain.  She has formed stool daily as long as she takes 17 g of MiraLAX.  She is not having any side effects. She has been using pantoprazole every other day and she says it is working.  She is not having any problem with hemorrhoids anymore.  On her last prescription she was given prescription for Mycolog-II cream. She remains concerned about her husband who was seen last week.    Current Medications: Outpatient Encounter Medications as of 07/25/2017  Medication Sig  . acetaminophen (TYLENOL) 650 MG CR tablet Take 1,300 mg by mouth daily.  Marland Kitchen amLODipine (NORVASC) 2.5 MG tablet Take 1 tablet (2.5 mg total) by mouth 2 (two) times daily.  . bisacodyl (DULCOLAX) 10 MG suppository Place 1 suppository (10 mg total) rectally as needed for moderate constipation.  . Cyanocobalamin (VITAMIN B-12 PO) Take by mouth daily.  . NON FORMULARY as needed. CBD OIL - 3 DROPS TOPICAL   . pantoprazole (PROTONIX) 40 MG tablet TAKE ONE TABLET BY MOUTH DAILY BEFORE BREAKFAST. (Patient taking differently: 40 mg. TAKE ONE TABLET BY MOUTH DAILY BEFORE BREAKFAST.)  . polyethylene glycol powder (GLYCOLAX/MIRALAX) powder Take 17 g by mouth 2 (two) times daily as needed.  . selegiline (EMSAM) 6 MG/24HR Place 1 patch (6 mg total) onto the skin daily.  Marland Kitchen SYNTHROID 75 MCG tablet TAKE 1 TABLET BY MOUTH DAILY.  . [DISCONTINUED] docusate sodium (COLACE) 100  MG capsule Take 1 capsule (100 mg total) by mouth 2 (two) times daily. (Patient not taking: Reported on 07/25/2017)  . [DISCONTINUED] hydrocortisone (ANUSOL-HC) 2.5 % rectal cream Place 1 application rectally 2 (two) times daily. (Patient not taking: Reported on 07/25/2017)  . [DISCONTINUED] LORazepam (ATIVAN) 0.5 MG tablet TAKE 1/2 TO 1 TABLET BY MOUTH TWICE DAILY. (Patient not taking: Reported on 07/25/2017)   No facility-administered encounter medications on file as of 07/25/2017.      Objective: Blood pressure 132/76, pulse 98, temperature 97.6 F (36.4 C), temperature source Oral, resp. rate 18, height 5\' 2"  (1.575 m), weight 118 lb 8 oz (53.8 kg).  Conjunctiva is pink. Sclera is nonicteric Oropharyngeal mucosa is normal. No neck masses or thyromegaly noted. Cardiac exam with regular rhythm normal S1 and S2. No murmur or gallop noted. Lungs are clear to auscultation. Abdomen is symmetrical with lower midline and left lower quadrant scar.  Abdomen is soft and nontender without organomegaly or masses. No LE edema or clubbing noted. She has changes of rheumatoid arthritis in both hands more prominent the right hand with swollen metacarpophalangeal joints and ulnar deviation.   Assessment:  #1.  Irritable bowel syndrome.  She had diarrhea for several years and now she is constipated but luckily doing well with dietary measures and MiraLAX or polyethylene glycol.  #2.  History of small bowel obstruction.  She is almost 3 months since her last episode.  Small bowel obstruction was felt to be secondary to adhesions.  She  needs to make sure she does not have diarrhea and/or constipation as either could predispose her to another episode.  #3.  GERD.  She will continue to take pantoprazole every other day.  Will not change prescription as she may have to go back to daily schedule. .  Plan:  Continue MiraLAX or polyethylene glycol at current dose of 17 g p.o. Daily. Dietary fiber as  tolerated. Pantoprazole 40 mg daily or every other day. Office visit in 6 months.

## 2017-08-08 ENCOUNTER — Other Ambulatory Visit: Payer: Self-pay | Admitting: Family Medicine

## 2017-08-15 DIAGNOSIS — M5136 Other intervertebral disc degeneration, lumbar region: Secondary | ICD-10-CM | POA: Diagnosis not present

## 2017-08-15 DIAGNOSIS — M5416 Radiculopathy, lumbar region: Secondary | ICD-10-CM | POA: Diagnosis not present

## 2017-08-15 DIAGNOSIS — Z981 Arthrodesis status: Secondary | ICD-10-CM | POA: Diagnosis not present

## 2017-08-17 ENCOUNTER — Encounter: Payer: Self-pay | Admitting: Family Medicine

## 2017-08-18 ENCOUNTER — Encounter: Payer: Self-pay | Admitting: Family Medicine

## 2017-09-04 DIAGNOSIS — Z6821 Body mass index (BMI) 21.0-21.9, adult: Secondary | ICD-10-CM | POA: Diagnosis not present

## 2017-09-04 DIAGNOSIS — M65341 Trigger finger, right ring finger: Secondary | ICD-10-CM | POA: Diagnosis not present

## 2017-09-04 DIAGNOSIS — Z79899 Other long term (current) drug therapy: Secondary | ICD-10-CM | POA: Diagnosis not present

## 2017-09-04 DIAGNOSIS — M0589 Other rheumatoid arthritis with rheumatoid factor of multiple sites: Secondary | ICD-10-CM | POA: Diagnosis not present

## 2017-09-06 DIAGNOSIS — M545 Low back pain: Secondary | ICD-10-CM | POA: Diagnosis not present

## 2017-09-06 DIAGNOSIS — M5116 Intervertebral disc disorders with radiculopathy, lumbar region: Secondary | ICD-10-CM | POA: Diagnosis not present

## 2017-09-08 ENCOUNTER — Other Ambulatory Visit: Payer: Self-pay | Admitting: Family Medicine

## 2017-09-12 DIAGNOSIS — M5116 Intervertebral disc disorders with radiculopathy, lumbar region: Secondary | ICD-10-CM | POA: Diagnosis not present

## 2017-09-12 DIAGNOSIS — M545 Low back pain: Secondary | ICD-10-CM | POA: Diagnosis not present

## 2017-09-15 DIAGNOSIS — M5116 Intervertebral disc disorders with radiculopathy, lumbar region: Secondary | ICD-10-CM | POA: Diagnosis not present

## 2017-09-15 DIAGNOSIS — M545 Low back pain: Secondary | ICD-10-CM | POA: Diagnosis not present

## 2017-09-20 ENCOUNTER — Encounter: Payer: Self-pay | Admitting: Internal Medicine

## 2017-09-20 ENCOUNTER — Encounter: Payer: Self-pay | Admitting: Family Medicine

## 2017-09-20 ENCOUNTER — Other Ambulatory Visit: Payer: Self-pay

## 2017-09-20 ENCOUNTER — Ambulatory Visit: Payer: Medicare Other | Admitting: Family Medicine

## 2017-09-20 VITALS — BP 130/78 | HR 83 | Temp 98.3°F | Resp 14 | Ht 62.5 in | Wt 121.0 lb

## 2017-09-20 DIAGNOSIS — Z23 Encounter for immunization: Secondary | ICD-10-CM | POA: Diagnosis not present

## 2017-09-20 DIAGNOSIS — E039 Hypothyroidism, unspecified: Secondary | ICD-10-CM

## 2017-09-20 DIAGNOSIS — Z1231 Encounter for screening mammogram for malignant neoplasm of breast: Secondary | ICD-10-CM

## 2017-09-20 DIAGNOSIS — K5900 Constipation, unspecified: Secondary | ICD-10-CM

## 2017-09-20 DIAGNOSIS — I10 Essential (primary) hypertension: Secondary | ICD-10-CM

## 2017-09-20 DIAGNOSIS — Z1239 Encounter for other screening for malignant neoplasm of breast: Secondary | ICD-10-CM

## 2017-09-20 MED ORDER — POLYETHYLENE GLYCOL 3350 17 GM/SCOOP PO POWD: ORAL | 1 refills | Status: DC

## 2017-09-20 MED ORDER — SYNTHROID 75 MCG PO TABS: 75.0000 ug | ORAL_TABLET | Freq: Every day | ORAL | 1 refills | Status: DC

## 2017-09-20 MED ORDER — AMLODIPINE BESYLATE 2.5 MG PO TABS
2.5000 mg | ORAL_TABLET | Freq: Two times a day (BID) | ORAL | 3 refills | Status: DC
Start: 2017-09-20 — End: 2018-07-31

## 2017-09-20 NOTE — Progress Notes (Signed)
Patient ID: Erica [name], female    DOB: 06/30/1932, 84 y.o.   MRN: [id number]  Chief Complaint  Patient presents with  . Hypertension    follow up  . small bowel obstruction    Allergies Penicillins; Hydrocodone; and Morphine and related  Subjective:   Erica [name] is a 84 y.o. female who presents to [facility] today.  HPI Here for follow up. Reports that has been being seen by Sports medicine, Dr. Albin Felling, for pinched nerve in back which is causing toothache pain in lower extremities. Has been doing the PT at [facility] in [place]. The pain is getting better. Has been doing the rehab twice a week at the facility and daily at home.  Reports is feeling much better.  Is taking her blood pressure medication each day.  Blood pressures running well.  Denies any chest pain, shortness of breath, palpitations, or swelling in her extremities.  Is taking her thyroid medication as directed.  Would like to get refills on these.  Is also being followed by psychiatry.  Still using the Emsam patches.  Denies any problems with her mood.  Reports that her bowel movements have been regular.  He is using the MiraLAX at night for constipation.  Has not had any bouts of abdominal pain or constipation.  Urinating well.  Denies any fever, chills, nausea, vomiting, or diarrhea.   Past Medical History:  Diagnosis Date  . Anxiety   . Bipolar disorder (HCC)   . Hyperlipidemia   . Hypothyroidism   . Irritable bowel syndrome   . RA (rheumatoid arthritis) (HCC)     Past Surgical History:  Procedure Laterality Date  . ABDOMINAL HYSTERECTOMY    . Bone Spurs  08/07/13   Right Shoulder  . COLONOSCOPY    . COLONOSCOPY N/A 05/08/2012   Procedure: COLONOSCOPY;  Surgeon: Malissa Hippo, MD;  [facility];  Service: Endoscopy;  Laterality: N/A;  730  . COLOSTOMY    . ESOPHAGOGASTRODUODENOSCOPY N/A 11/04/2013   Procedure: ESOPHAGOGASTRODUODENOSCOPY (EGD);  Surgeon: Malissa Hippo, MD;   Location[facility];  Service: Endoscopy;  Laterality: N/A;  730  . EYE SURGERY  cataract x 2  . MALONEY DILATION N/A 11/04/2013   Procedure: Elease Hashimoto DILATION;  Surgeon: Malissa Hippo, MD;  Location: [facility];  Service: Endoscopy;  Laterality: N/A;  . UPPER GASTROINTESTINAL ENDOSCOPY      Family History  Problem Relation Age of Onset  . Colon cancer Mother   . Bipolar disorder Mother   . Anxiety disorder Mother   . Atrial fibrillation Son   . Depression Maternal Grandmother      Social History   Socioeconomic History  . Marital status: Married    Spouse name: Not on file  . Number of children: Not on file  . Years of education: Not on file  . Highest education level: Not on file  Occupational History  . Not on file  Social Needs  . Financial resource strain: Not on file  . Food insecurity:    Worry: Not on file    Inability: Not on file  . Transportation needs:    Medical: Not on file    Non-medical: Not on file  Tobacco Use  . Smoking status: Former Smoker    Packs/day: 1.50    Years: 27.00    Pack years: 40.50    Types: Cigarettes    Last attempt to quit: 04/18/1981  Years since quitting: 36.4  . Smokeless tobacco: Former Neurosurgeon    Quit date: 11/27/1980  Substance and Sexual Activity  . Alcohol use: No    Alcohol/week: 0.0 oz  . Drug use: No  . Sexual activity: Never    Partners: Male  Lifestyle  . Physical activity:    Days per week: Not on file    Minutes per session: Not on file  . Stress: Not on file  Relationships  . Social connections:    Talks on phone: Not on file    Gets together: Not on file    Attends religious service: Not on file    Active member of club or organization: Not on file    Attends meetings of clubs or organizations: Not on file    Relationship status: Not on file  Other Topics Concern  . Not on file  Social History Narrative  . Not on file   Current Outpatient Medications on File Prior to Visit  Medication Sig  Dispense Refill  . acetaminophen (TYLENOL) 650 MG CR tablet Take 1,300 mg by mouth daily.    . Cyanocobalamin (VITAMIN B-12 PO) Take by mouth daily.    Marland Kitchen ibuprofen (ADVIL,MOTRIN) 200 MG tablet Take 200 mg by mouth every 6 (six) hours as needed (pain).    . NON FORMULARY as needed. CBD OIL - 3 DROPS TOPICAL     . pantoprazole (PROTONIX) 40 MG tablet TAKE ONE TABLET BY MOUTH DAILY BEFORE BREAKFAST. (Patient taking differently: 40 mg. TAKE ONE TABLET BY MOUTH DAILY BEFORE BREAKFAST.) 30 tablet 5  . selegiline (EMSAM) 6 MG/24HR Place 1 patch (6 mg total) onto the skin daily. 30 patch 12   No current facility-administered medications on file prior to visit.     Review of Systems   Objective:   BP 130/78 (BP Location: Left Arm, Patient Position: Sitting, Cuff Size: Normal)   Pulse 83   Temp 98.3 F (36.8 C) (Temporal)   Resp 14   Ht 5' 2.5" (1.588 m)   Wt 121 lb (54.9 kg)   SpO2 96%   BMI 21.78 kg/m   Physical Exam  Constitutional: She appears well-developed and well-nourished.  HENT:  Head: Normocephalic and atraumatic.  Mouth/Throat: Oropharynx is clear and moist.  Neck: Normal range of motion. Neck supple.  Cardiovascular: Normal rate, regular rhythm and normal heart sounds.  Pulmonary/Chest: Effort normal and breath sounds normal.  Abdominal: Soft. Bowel sounds are normal.  Skin: Skin is warm and dry. Capillary refill takes less than 2 seconds.  Psychiatric: She has a normal mood and affect. Her behavior is normal. Judgment and thought content normal.  Vitals reviewed.    Assessment and Plan  1. Essential hypertension Stable.  Continue current medications.  2. Screening for breast cancer Patient requests referral for mammogram.  Referral placed. - MM 3D SCREEN BREAST BILATERAL; Future  3. Constipation in female Tinea MiraLAX as directed.  Call with any problems.  Keep scheduled follow-up with Dr.Rehman.  - polyethylene glycol powder (GLYCOLAX/MIRALAX) powder; 17 gram  po qd as directed.  Dispense: 1530 g; Refill: 1  4. Acquired hypothyroidism Continue thyroid medication.  Due for recheck in 2020. - SYNTHROID 75 MCG tablet; Take 1 tablet (75 mcg total) by mouth daily.  Dispense: 90 tablet; Refill: 1  5. HTN (hypertension), benign Stable.  - amLODipine (NORVASC) 2.5 MG tablet; Take 1 tablet (2.5 mg total) by mouth 2 (two) times daily.  Dispense: 90 tablet; Refill: 3  Follow up with  new PCP office in 3 months OSIN.  Return in about 3 months (around 12/21/2017). Aliene Beams, MD 09/20/2017

## 2017-09-22 DIAGNOSIS — M5116 Intervertebral disc disorders with radiculopathy, lumbar region: Secondary | ICD-10-CM | POA: Diagnosis not present

## 2017-09-22 DIAGNOSIS — M545 Low back pain: Secondary | ICD-10-CM | POA: Diagnosis not present

## 2017-10-03 DIAGNOSIS — M5116 Intervertebral disc disorders with radiculopathy, lumbar region: Secondary | ICD-10-CM | POA: Diagnosis not present

## 2017-10-03 DIAGNOSIS — M545 Low back pain: Secondary | ICD-10-CM | POA: Diagnosis not present

## 2017-10-06 DIAGNOSIS — M545 Low back pain: Secondary | ICD-10-CM | POA: Diagnosis not present

## 2017-10-06 DIAGNOSIS — M5116 Intervertebral disc disorders with radiculopathy, lumbar region: Secondary | ICD-10-CM | POA: Diagnosis not present

## 2017-11-29 ENCOUNTER — Other Ambulatory Visit (INDEPENDENT_AMBULATORY_CARE_PROVIDER_SITE_OTHER): Payer: Self-pay | Admitting: Internal Medicine

## 2017-12-22 DIAGNOSIS — Z23 Encounter for immunization: Secondary | ICD-10-CM | POA: Diagnosis not present

## 2018-01-04 ENCOUNTER — Other Ambulatory Visit: Payer: Self-pay | Admitting: Family Medicine

## 2018-01-04 DIAGNOSIS — E039 Hypothyroidism, unspecified: Secondary | ICD-10-CM

## 2018-01-25 ENCOUNTER — Ambulatory Visit (INDEPENDENT_AMBULATORY_CARE_PROVIDER_SITE_OTHER): Payer: Self-pay | Admitting: Internal Medicine

## 2018-01-30 ENCOUNTER — Ambulatory Visit (INDEPENDENT_AMBULATORY_CARE_PROVIDER_SITE_OTHER): Payer: Medicare Other | Admitting: Internal Medicine

## 2018-01-30 ENCOUNTER — Encounter (INDEPENDENT_AMBULATORY_CARE_PROVIDER_SITE_OTHER): Payer: Self-pay | Admitting: Internal Medicine

## 2018-01-30 VITALS — BP 104/68 | HR 110 | Temp 97.6°F | Resp 18 | Ht 62.0 in | Wt 117.8 lb

## 2018-01-30 DIAGNOSIS — K219 Gastro-esophageal reflux disease without esophagitis: Secondary | ICD-10-CM

## 2018-01-30 DIAGNOSIS — K589 Irritable bowel syndrome without diarrhea: Secondary | ICD-10-CM

## 2018-01-30 MED ORDER — PANTOPRAZOLE SODIUM 40 MG PO TBEC: 40.0000 mg | DELAYED_RELEASE_TABLET | ORAL | 3 refills | Status: DC

## 2018-01-30 NOTE — Patient Instructions (Signed)
You can take Pepcid OTC/famotidine 20 mg on as-needed basis or on days when you do not take pantoprazole.

## 2018-01-30 NOTE — Progress Notes (Signed)
Presenting complaint;  Follow-up for GERD and IBS.  Database and subjective:  Patient is 84 year old Caucasian female who has history of GERD complicated by esophageal stricture which was dilated in August 2015 as well as history of IBS with chronic diarrhea who was hospitalized in February this year for small bowel obstruction possibly due to adhesive disease and responded to conservative therapy.  Since then she has had more problem with constipation and has been on polyethylene glycol. She was last seen on 07/25/2017.  She was advised to try PPI every other day.  She states she is doing well.  She did not experience breakthrough heartburn or regurgitation when she change her pantoprazole to every other day.  She has heartburn with certain foods which is not very often.  She denies abdominal pain nausea or vomiting.  She generally has bowel movements daily.  Sometimes she had a 2 or 3 usually in the morning.  She takes polyethylene glycol just about every day.  She denies melena or rectal bleeding.  Her appetite is good and her weight has been stable. She is worried about her husband who is not doing well.  He is now under hospice care.   Current Medications: Outpatient Encounter Medications as of 01/30/2018  Medication Sig  . acetaminophen (TYLENOL) 650 MG CR tablet Take 1,300 mg by mouth daily.  Marland Kitchen amLODipine (NORVASC) 2.5 MG tablet Take 1 tablet (2.5 mg total) by mouth 2 (two) times daily.  . Cyanocobalamin (VITAMIN B-12 PO) Take by mouth daily.  . NON FORMULARY as needed. CBD OIL - 3 DROPS TOPICAL   . pantoprazole (PROTONIX) 40 MG tablet TAKE ONE TABLET BY MOUTH DAILY BEFORE BREAKFAST.  Marland Kitchen polyethylene glycol powder (GLYCOLAX/MIRALAX) powder 17 gram po qd as directed.  . selegiline (EMSAM) 6 MG/24HR Place 1 patch (6 mg total) onto the skin daily.  Marland Kitchen SYNTHROID 75 MCG tablet Take 1 tablet (75 mcg total) by mouth daily.  . [DISCONTINUED] ibuprofen (ADVIL,MOTRIN) 200 MG tablet Take 200 mg by  mouth every 6 (six) hours as needed (pain).   No facility-administered encounter medications on file as of 01/30/2018.      Objective: Blood pressure 104/68, pulse (!) 110, temperature 97.6 F (36.4 C), temperature source Oral, resp. rate 18, height 5\' 2"  (1.575 m), weight 117 lb 12.8 oz (53.4 kg). Patient is alert and in no acute distress. Conjunctiva is pink. Sclera is nonicteric Oropharyngeal mucosa is normal. No neck masses or thyromegaly noted. Cardiac exam with regular rhythm normal S1 and S2. No murmur or gallop noted. Lungs are clear to auscultation. Abdomen is symmetrical with lower midline scar as well as scar in left lower quadrant where she had colostomy.  Bowel sounds are normal.  On palpation abdomen is soft and nontender with organomegaly or masses. She has marked ulnar deviation and expanded metacarpal phalangeal joints in her right hand with very limited grip.     Assessment:  #1.  Chronic GERD.  She is doing well with antireflux measures and every other day PPI.  She underwent esophageal dilation in August 2015 and has not had any issues with dysphagia.  #2.  History of irritable bowel syndrome.  She had lifelong problems with diarrhea lately she has had issues with constipation and is on polyethylene glycol with satisfactory results.   Plan:  New prescription given for pantoprazole 40 mg every other day.  90-day supply with 3 refills. Continue polyethylene glycol daily but can titrate dose. Office visit in 6 months.

## 2018-01-31 DIAGNOSIS — E039 Hypothyroidism, unspecified: Secondary | ICD-10-CM | POA: Diagnosis not present

## 2018-02-07 DIAGNOSIS — Z6823 Body mass index (BMI) 23.0-23.9, adult: Secondary | ICD-10-CM | POA: Diagnosis not present

## 2018-02-07 DIAGNOSIS — E039 Hypothyroidism, unspecified: Secondary | ICD-10-CM | POA: Diagnosis not present

## 2018-02-07 DIAGNOSIS — I10 Essential (primary) hypertension: Secondary | ICD-10-CM | POA: Diagnosis not present

## 2018-02-22 DIAGNOSIS — K589 Irritable bowel syndrome without diarrhea: Secondary | ICD-10-CM | POA: Diagnosis not present

## 2018-02-22 DIAGNOSIS — M5416 Radiculopathy, lumbar region: Secondary | ICD-10-CM | POA: Diagnosis not present

## 2018-03-05 DIAGNOSIS — Z79899 Other long term (current) drug therapy: Secondary | ICD-10-CM | POA: Diagnosis not present

## 2018-03-05 DIAGNOSIS — M65341 Trigger finger, right ring finger: Secondary | ICD-10-CM | POA: Diagnosis not present

## 2018-03-05 DIAGNOSIS — Z6821 Body mass index (BMI) 21.0-21.9, adult: Secondary | ICD-10-CM | POA: Diagnosis not present

## 2018-03-05 DIAGNOSIS — M0589 Other rheumatoid arthritis with rheumatoid factor of multiple sites: Secondary | ICD-10-CM | POA: Diagnosis not present

## 2018-05-01 DIAGNOSIS — L738 Other specified follicular disorders: Secondary | ICD-10-CM | POA: Diagnosis not present

## 2018-05-01 DIAGNOSIS — D692 Other nonthrombocytopenic purpura: Secondary | ICD-10-CM | POA: Diagnosis not present

## 2018-05-01 DIAGNOSIS — L570 Actinic keratosis: Secondary | ICD-10-CM | POA: Diagnosis not present

## 2018-05-02 ENCOUNTER — Encounter (HOSPITAL_COMMUNITY): Payer: Self-pay | Admitting: Emergency Medicine

## 2018-05-02 ENCOUNTER — Emergency Department (HOSPITAL_COMMUNITY): Payer: Medicare Other

## 2018-05-02 ENCOUNTER — Other Ambulatory Visit: Payer: Self-pay

## 2018-05-02 ENCOUNTER — Emergency Department (HOSPITAL_COMMUNITY)
Admission: EM | Admit: 2018-05-02 | Discharge: 2018-05-02 | Disposition: A | Discharge status: Home / Self Care | Payer: Medicare Other | Attending: Emergency Medicine | Admitting: Emergency Medicine

## 2018-05-02 DIAGNOSIS — Y929 Unspecified place or not applicable: Secondary | ICD-10-CM | POA: Insufficient documentation

## 2018-05-02 DIAGNOSIS — M79641 Pain in right hand: Secondary | ICD-10-CM | POA: Diagnosis not present

## 2018-05-02 DIAGNOSIS — Y999 Unspecified external cause status: Secondary | ICD-10-CM | POA: Insufficient documentation

## 2018-05-02 DIAGNOSIS — Z87891 Personal history of nicotine dependence: Secondary | ICD-10-CM | POA: Insufficient documentation

## 2018-05-02 DIAGNOSIS — W010XXA Fall on same level from slipping, tripping and stumbling without subsequent striking against object, initial encounter: Secondary | ICD-10-CM | POA: Diagnosis not present

## 2018-05-02 DIAGNOSIS — Y9389 Activity, other specified: Secondary | ICD-10-CM | POA: Diagnosis not present

## 2018-05-02 DIAGNOSIS — Z79899 Other long term (current) drug therapy: Secondary | ICD-10-CM | POA: Diagnosis not present

## 2018-05-02 DIAGNOSIS — S52501A Unspecified fracture of the lower end of right radius, initial encounter for closed fracture: Secondary | ICD-10-CM | POA: Diagnosis not present

## 2018-05-02 DIAGNOSIS — E039 Hypothyroidism, unspecified: Secondary | ICD-10-CM | POA: Insufficient documentation

## 2018-05-02 DIAGNOSIS — I10 Essential (primary) hypertension: Secondary | ICD-10-CM | POA: Diagnosis not present

## 2018-05-02 NOTE — ED Provider Notes (Signed)
[facility] EMERGENCY DEPARTMENT Provider Note   CSN: [id number] Arrival date & time: 05/02/18  1128    History   Chief Complaint Chief Complaint  Patient presents with  . Hand Pain    HPI Erica [name] is a 85 y.o. female.     HPI   85 year old female with right hand pain.  She was backing up, lost her balance and fell.  She has had persistent right hand pain and swelling since.  She has a history of rheumatoid arthritis.  She reports chronic deformity and limited use of her right hand at baseline.  She denies any other acute complaints.  She is not anticoagulated.  She did not hit her head.  Past Medical History:  Diagnosis Date  . Anxiety   . Bipolar disorder (HCC)   . Hyperlipidemia   . Hypothyroidism   . Irritable bowel syndrome   . RA (rheumatoid arthritis) [facility])     Patient Active Problem List   Diagnosis Date Noted  . Constipation in female 06/22/2017  . Fall 05/07/2017  . Right rib fracture 05/07/2017  . AKI (acute kidney injury) (HCC) 05/04/2017  . Hyponatremia 05/03/2017  . Bipolar disorder with severe depression (HCC) 11/23/2016  . Hypertension 11/23/2016  . Rheumatoid arthritis involving both hands with positive rheumatoid factor (HCC) 06/01/2016  . Partial small bowel obstruction (HCC) 03/12/2015  . Depression 03/12/2015  . Hypotension 03/12/2015  . Lactic acidosis 03/12/2015  . Dehydration 03/12/2015  . Small bowel obstruction (HCC) 03/12/2015  . Hypothyroidism 02/21/2014  . 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 Major depressive disorder, recurrent episode with anxious distress (HCC) 02/13/2014  . Tachycardia 08/24/2013  . S/P arthroscopy of shoulder 08/14/2013  . Muscle weakness (generalized) 06/14/2013  . Rotator cuff syndrome of right shoulder 12/05/2012  . Right shoulder pain 12/05/2012  . Rectal bleeding 06/06/2011  . Epigastric pain 04/19/2011  . GERD (gastroesophageal reflux disease) 04/19/2011  . IBS (irritable bowel syndrome)  04/19/2011  . METATARSALGIA 05/12/2009  . BUNION 05/12/2009    Past Surgical History:  Procedure Laterality Date  . ABDOMINAL HYSTERECTOMY    . Bone Spurs  08/07/13   Right Shoulder  . COLONOSCOPY    . COLONOSCOPY N/A 05/08/2012   Procedure: COLONOSCOPY;  Surgeon: Malissa Hippo, MD;  [facility];  Service: Endoscopy;  Laterality: N/A;  730  . COLOSTOMY    . ESOPHAGOGASTRODUODENOSCOPY N/A 11/04/2013   Procedure: ESOPHAGOGASTRODUODENOSCOPY (EGD);  Surgeon: Malissa Hippo, MD;  [facility]: [facility];  Service: Endoscopy;  Laterality: N/A;  730  . EYE SURGERY  cataract x 2  . MALONEY DILATION N/A 11/04/2013   Procedure: Elease Hashimoto DILATION;  Surgeon: Malissa Hippo, MD;  [facility]: [facility];  Service: Endoscopy;  Laterality: N/A;  . UPPER GASTROINTESTINAL ENDOSCOPY       OB History   No obstetric history on file.      Home Medications    Prior to Admission medications   Medication Sig Start Date End Date Taking? Authorizing Provider  acetaminophen (TYLENOL) 650 MG CR tablet Take 1,300 mg by mouth daily.    [provider]  amLODipine (NORVASC) 2.5 MG tablet Take 1 tablet (2.5 mg total) by mouth 2 (two) times daily. 09/20/17   Aliene Beams, MD  Cyanocobalamin (VITAMIN B-12 PO) Take by mouth daily.    [provider]  NON FORMULARY as needed. CBD OIL - 3 DROPS TOPICAL     [provider]  pantoprazole (PROTONIX) 40 MG tablet  Take 1 tablet (40 mg total) by mouth every other day. 01/30/18   Rehman, Joline Maxcy, MD  polyethylene glycol powder (GLYCOLAX/MIRALAX) powder 17 gram po qd as directed. 09/20/17   Aliene Beams, MD  selegiline (EMSAM) 6 MG/24HR Place 1 patch (6 mg total) onto the skin daily. 12/07/16   Aliene Beams, MD  SYNTHROID 75 MCG tablet Take 1 tablet (75 mcg total) by mouth daily. 09/20/17   Aliene Beams, MD    Family History Family History  Problem Relation Age of Onset  . Colon cancer Mother   . Bipolar disorder Mother     . Anxiety disorder Mother   . Atrial fibrillation Son   . Depression Maternal Grandmother     Social History Social History   Tobacco Use  . Smoking status: Former Smoker    Packs/day: 1.50    Years: 27.00    Pack years: 40.50    Types: Cigarettes    Last attempt to quit: 04/18/1981    Years since quitting: 37.0  . Smokeless tobacco: Former Neurosurgeon    Quit date: 11/27/1980  Substance Use Topics  . Alcohol use: No    Alcohol/week: 0.0 standard drinks  . Drug use: No     Allergies   Penicillins; Hydrocodone; and Morphine and related   Review of Systems Review of Systems  All systems reviewed and negative, other than as noted in HPI.  Physical Exam Updated Vital Signs BP (!) 172/77 (BP Location: Left Arm)   Pulse (!) 111   Temp 98 F (36.7 C) (Oral)   Resp 18   SpO2 100%   Physical Exam Vitals signs and nursing note reviewed.  Constitutional:      General: She is not in acute distress.    Appearance: She is well-developed.  HENT:     Head: Normocephalic and atraumatic.  Eyes:     General:        Right eye: No discharge.        Left eye: No discharge.     Conjunctiva/sclera: Conjunctivae normal.  Neck:     Musculoskeletal: Neck supple.  Cardiovascular:     Rate and Rhythm: Normal rate and regular rhythm.     Heart sounds: Normal heart sounds. No murmur. No friction rub. No gallop.   Pulmonary:     Effort: Pulmonary effort is normal. No respiratory distress.     Breath sounds: Normal breath sounds.  Abdominal:     General: There is no distension.     Palpations: Abdomen is soft.     Tenderness: There is no abdominal tenderness.  Musculoskeletal:     Comments: Chronic appearing deformity to right hand.  Some mild swelling and tenderness to distal radius and along the first metatarsal.  No snuffbox tenderness.  Closed injury.  Neurovascular intact distally.  Skin:    General: Skin is warm and dry.  Neurological:     Mental Status: She is alert.   Psychiatric:        Behavior: Behavior normal.        Thought Content: Thought content normal.      ED Treatments / Results  Labs (all labs ordered are listed, but only abnormal results are displayed) Labs Reviewed - No data to display  EKG None  Radiology Dg Hand Complete Right  Result Date: 05/02/2018 CLINICAL DATA:  85 year old female with hand pain EXAM: RIGHT HAND - COMPLETE 3+ VIEW COMPARISON:  No prior for comparison FINDINGS: Osteopenia. Chronic ulnar subluxation of the second-fifth  metacarpophalangeal joints is suspected, though with no comparison. Frontal view demonstrates foreshortening of the metacarpals. No acute displaced fracture identified. Degenerative changes of the interphalangeal joints. No radiopaque foreign body or focal soft tissue swelling. The lateral view demonstrates questionable cortical irregularity at the distal radius. IMPRESSION: No acute displaced fracture of the hand, however, osteopenia and the positioning limit evaluation. Irregularity of the dorsal cortex of the distal radius on the lateral view. This may be degenerative or potentially nondisplaced fracture. Recommend correlation with point tenderness at the dorsal wrist. Subluxation of the MCPs involving 2-5. Degenerative changes of the interphalangeal joints. Electronically Signed   By: Gilmer MorJaime  Wagner D.O.   On: 05/02/2018 12:37    Procedures Procedures (including critical care time)  Medications Ordered in ED Medications - No data to display   Initial Impression / Assessment and Plan / ED Course  I have reviewed the triage vital signs and the nursing notes.  Pertinent labs & imaging results that were available during my care of the patient were reviewed by me and considered in my medical decision making (see chart for details).        85yF with hand/wrist pain. Chronic underlying pain/restricted ROM 2/2 RA. Questionable nondisplaced distal radius fx. She is tender there although not  exquisitely so. Will place in removable velcro splint. Ortho FU.   Final Clinical Impressions(s) / ED Diagnoses   Final diagnoses:  Closed fracture of distal end of right radius, unspecified fracture morphology, initial encounter    ED Discharge Orders    None       Raeford RazorKohut, Lota Leamer, MD 05/03/18 2134

## 2018-05-02 NOTE — ED Triage Notes (Signed)
RT hand pain after loosing balance and falling

## 2018-05-02 NOTE — Discharge Instructions (Addendum)
You have a questionable fracture in your radius. Wear the splint as needed for comfort. I'm fine with you continuing to use your hand/wrist as you can tolerate. Follow-up with orthopedic surgery although this will not require surgery.

## 2018-05-08 DIAGNOSIS — M84333A Stress fracture, right radius, initial encounter for fracture: Secondary | ICD-10-CM | POA: Diagnosis not present

## 2018-05-08 DIAGNOSIS — M5416 Radiculopathy, lumbar region: Secondary | ICD-10-CM | POA: Diagnosis not present

## 2018-05-15 ENCOUNTER — Other Ambulatory Visit (HOSPITAL_COMMUNITY): Payer: Self-pay | Admitting: Internal Medicine

## 2018-05-15 DIAGNOSIS — Z1239 Encounter for other screening for malignant neoplasm of breast: Secondary | ICD-10-CM

## 2018-05-21 ENCOUNTER — Ambulatory Visit (HOSPITAL_COMMUNITY)
Admission: RE | Admit: 2018-05-21 | Discharge: 2018-05-21 | Disposition: A | Discharge status: Home / Self Care | Payer: Medicare Other | Source: Ambulatory Visit | Attending: Family Medicine | Admitting: Family Medicine

## 2018-05-21 DIAGNOSIS — Z1231 Encounter for screening mammogram for malignant neoplasm of breast: Secondary | ICD-10-CM | POA: Insufficient documentation

## 2018-05-21 DIAGNOSIS — Z1239 Encounter for other screening for malignant neoplasm of breast: Secondary | ICD-10-CM

## 2018-06-01 DIAGNOSIS — I10 Essential (primary) hypertension: Secondary | ICD-10-CM | POA: Diagnosis not present

## 2018-06-01 DIAGNOSIS — Z79899 Other long term (current) drug therapy: Secondary | ICD-10-CM | POA: Diagnosis not present

## 2018-06-01 DIAGNOSIS — E039 Hypothyroidism, unspecified: Secondary | ICD-10-CM | POA: Diagnosis not present

## 2018-06-01 DIAGNOSIS — F319 Bipolar disorder, unspecified: Secondary | ICD-10-CM | POA: Diagnosis not present

## 2018-06-08 DIAGNOSIS — M5416 Radiculopathy, lumbar region: Secondary | ICD-10-CM | POA: Diagnosis not present

## 2018-06-08 DIAGNOSIS — G5601 Carpal tunnel syndrome, right upper limb: Secondary | ICD-10-CM | POA: Diagnosis not present

## 2018-06-08 DIAGNOSIS — E785 Hyperlipidemia, unspecified: Secondary | ICD-10-CM | POA: Diagnosis not present

## 2018-06-13 DIAGNOSIS — L570 Actinic keratosis: Secondary | ICD-10-CM | POA: Diagnosis not present

## 2018-06-13 DIAGNOSIS — D485 Neoplasm of uncertain behavior of skin: Secondary | ICD-10-CM | POA: Diagnosis not present

## 2018-06-13 DIAGNOSIS — C44629 Squamous cell carcinoma of skin of left upper limb, including shoulder: Secondary | ICD-10-CM | POA: Diagnosis not present

## 2018-07-23 DIAGNOSIS — R52 Pain, unspecified: Secondary | ICD-10-CM | POA: Diagnosis not present

## 2018-07-23 DIAGNOSIS — R200 Anesthesia of skin: Secondary | ICD-10-CM | POA: Diagnosis not present

## 2018-07-31 ENCOUNTER — Ambulatory Visit (INDEPENDENT_AMBULATORY_CARE_PROVIDER_SITE_OTHER): Payer: Medicare Other | Admitting: Internal Medicine

## 2018-07-31 ENCOUNTER — Encounter (INDEPENDENT_AMBULATORY_CARE_PROVIDER_SITE_OTHER): Payer: Self-pay | Admitting: Internal Medicine

## 2018-07-31 ENCOUNTER — Other Ambulatory Visit: Payer: Self-pay

## 2018-07-31 DIAGNOSIS — K219 Gastro-esophageal reflux disease without esophagitis: Secondary | ICD-10-CM | POA: Diagnosis not present

## 2018-07-31 DIAGNOSIS — K589 Irritable bowel syndrome without diarrhea: Secondary | ICD-10-CM

## 2018-07-31 MED ORDER — IBUPROFEN 200 MG PO TABS: 200.0000 mg | ORAL_TABLET | Freq: Three times a day (TID) | ORAL | 0 refills | Status: DC | PRN

## 2018-07-31 NOTE — Progress Notes (Signed)
Presenting complaint;  Follow-up for IBS and GERD.  Subjective:  Patient is 85 year old Caucasian female who is here for scheduled visit accompanied by her daughter-in-law Angelique Blonder.  She was last seen in November 2019.  She states she is doing well in spite of going through extreme stress.  She lost her husband. She is also having pain in her right forearm and hand.  She is also having pain in her left leg.  She has a history of rheumatoid arthritis and not taking any medications.  She wants to make sure that she is not taking too much Tylenol and wants to know if she could take ibuprofen daily. She rarely has heartburn.  She feels she is doing well with pantoprazole every other day.  She also denies dysphagia or throat symptoms. Her bowels are moving just about every day.  She has a history of diarrhea with urgency and accidents.  However she is doing well.  She is having to titrate polyethylene glycol dose. Her weight has been stable since her last visit.  Current Medications: Outpatient Encounter Medications as of 07/31/2018  Medication Sig  . acetaminophen (TYLENOL) 650 MG CR tablet Take 1,300 mg by mouth daily.  . Cyanocobalamin (VITAMIN B-12 PO) Take by mouth daily.  Marland Kitchen ibuprofen (ADVIL) 200 MG tablet Take 1 tablet (200 mg total) by mouth 3 (three) times daily as needed.  . NON FORMULARY as needed. CBD OIL - 3 DROPS TOPICAL   . pantoprazole (PROTONIX) 40 MG tablet Take 1 tablet (40 mg total) by mouth every other day.  . polyethylene glycol powder (GLYCOLAX/MIRALAX) powder 17 gram po qd as directed.  . selegiline (EMSAM) 6 MG/24HR Place 1 patch (6 mg total) onto the skin daily.  Marland Kitchen SYNTHROID 75 MCG tablet Take 1 tablet (75 mcg total) by mouth daily.  Marland Kitchen VITAMIN D PO Take by mouth.  . [DISCONTINUED] ibuprofen (ADVIL) 200 MG tablet Take 200 mg by mouth as needed.  . [DISCONTINUED] amLODipine (NORVASC) 2.5 MG tablet Take 1 tablet (2.5 mg total) by mouth 2 (two) times daily. (Patient not taking:  Reported on 07/31/2018)   No facility-administered encounter medications on file as of 07/31/2018.      Objective: Weight 118.1 pounds she is 62 inches tall. Pulse 123/min.  Blood pressure 150/87.  Respirations 18 and temp is 98.6. She is alert and in no acute distress. Conjunctiva is pink. Sclera is nonicteric Oropharyngeal mucosa is normal. No neck masses or thyromegaly noted. Cardiac exam with regular rhythm normal S1 and S2. No murmur or gallop noted. Lungs are clear to auscultation. Abdomen is symmetrical with lower midline and colostomy scars.  On palpation abdomen is soft and nontender. No LE edema or clubbing noted. She has deformity to her right hand with ulnar deviation related to rheumatoid arthritis.  Assessment:  #1.  GERD.  She is doing well with antireflux measures and PPI every other day.  She has a history of esophageal stricture and has undergone esophageal dilation in the past but she is not having any issues with dysphagia.  #2.  History of IBS.  She used to have diarrhea but then she began to have constipation and she is doing very well with polyethylene glycol which she takes every day but titrates the dose.  #3.  NSAID use.  Patient informed that she can take ibuprofen no more than 200 mg up to 3 times a day.  She should taken with food or snack.  In addition she can use acetaminophen no  more than 2.6 g/day.  Please note on most days she only takes 1300 mg.   Plan:  Advil OTC 20 mg p.o. 3 times daily as needed. Office visit in 6 months.

## 2018-07-31 NOTE — Patient Instructions (Signed)
Please call if pantoprazole stops working but if you have swallowing difficulty

## 2018-08-10 DIAGNOSIS — M5416 Radiculopathy, lumbar region: Secondary | ICD-10-CM | POA: Diagnosis not present

## 2018-08-10 DIAGNOSIS — M4722 Other spondylosis with radiculopathy, cervical region: Secondary | ICD-10-CM | POA: Diagnosis not present

## 2018-08-15 DIAGNOSIS — L438 Other lichen planus: Secondary | ICD-10-CM | POA: Diagnosis not present

## 2018-08-15 DIAGNOSIS — Z85828 Personal history of other malignant neoplasm of skin: Secondary | ICD-10-CM | POA: Diagnosis not present

## 2018-08-15 DIAGNOSIS — L570 Actinic keratosis: Secondary | ICD-10-CM | POA: Diagnosis not present

## 2018-08-20 ENCOUNTER — Other Ambulatory Visit: Payer: Self-pay

## 2018-08-20 ENCOUNTER — Encounter (HOSPITAL_COMMUNITY): Payer: Self-pay

## 2018-08-20 ENCOUNTER — Ambulatory Visit (HOSPITAL_COMMUNITY): Payer: Medicare Other | Attending: Orthopaedic Surgery

## 2018-08-20 DIAGNOSIS — R293 Abnormal posture: Secondary | ICD-10-CM

## 2018-08-20 DIAGNOSIS — G8929 Other chronic pain: Secondary | ICD-10-CM | POA: Diagnosis not present

## 2018-08-20 DIAGNOSIS — M6281 Muscle weakness (generalized): Secondary | ICD-10-CM

## 2018-08-20 DIAGNOSIS — M5442 Lumbago with sciatica, left side: Secondary | ICD-10-CM | POA: Insufficient documentation

## 2018-08-20 NOTE — Therapy (Signed)
[place], [place], [address] Phone: [phone]   Fax:  [phone]  Physical Therapy Evaluation  Patient Details  Name: Erica [name] MRN: [id number] Date of Birth: 03/06/1933 Referring Provider (PT): Sherron Ales, MD   Encounter Date: 08/20/2018  PT End of Session - 08/20/18 1026    Visit Number  1    Number of Visits  12    Date for PT Re-Evaluation  10/01/18    Authorization Type  BCBS Medicare ($40 co-pay, no auth, no limit)    Authorization Time Period  08/20/2018-7/20-2020    Authorization - Visit Number  1    Authorization - Number of Visits  10    PT Start Time  0911    PT Stop Time  1010    PT Time Calculation (min)  59 min    Equipment Utilized During Treatment  Gait belt    Activity Tolerance  Patient tolerated treatment well    Behavior During Therapy  [facility] for tasks assessed/performed       Past Medical History:  Diagnosis Date  . Anxiety   . Bipolar disorder ([place])   . Hyperlipidemia   . Hypothyroidism   . Irritable bowel syndrome   . RA (rheumatoid arthritis) ([place])     Past Surgical History:  Procedure Laterality Date  . ABDOMINAL HYSTERECTOMY    . Bone Spurs  08/07/13   Right Shoulder  . COLONOSCOPY    . COLONOSCOPY N/A 05/08/2012   Procedure: COLONOSCOPY;  Surgeon: Rogene Houston, MD;  [facility];  Service: Endoscopy;  Laterality: N/A;  730  . COLOSTOMY    . ESOPHAGOGASTRODUODENOSCOPY N/A 11/04/2013   Procedure: ESOPHAGOGASTRODUODENOSCOPY (EGD);  Surgeon: Rogene Houston, MD;  [facility];  Service: Endoscopy;  Laterality: N/A;  730  . EYE SURGERY  cataract x 2  . MALONEY DILATION N/A 11/04/2013   Procedure: Venia Minks DILATION;  Surgeon: Rogene Houston, MD;  [facility];  Service: Endoscopy;  Laterality: N/A;  . UPPER GASTROINTESTINAL ENDOSCOPY      There were no vitals filed for this visit.   Subjective Assessment - 08/20/18 1034     Subjective  Ms. Siedlecki reports a long history of interactions with multiple healthcare providers to address her back and Lt leg pain. She has a history of rheumatoid arthritis and sees Dr. Amil Amen to manage this and reports her medication Emsam helps with her RA pain. She states her Lt LE pain began ~ 1.5 years ago and Rt hand pain began ~ 4 months ago after a fall in February. Initially she was thought to have fractured her right wrist but after follow up with Dr. Fredna Dow was found that she did not fracture anything. He informed her he believes the wrist/hand pain is related to her neck. She was also referred to Dr. Octavio Manns for her Lt leg pain and he informed her that he believes the leg pain may be from her back as well. (upon chart review it appears Dr. Octavio Manns suspects it is related to her back or her Lt ankle arthritis). Ms. Bigos states she can manage with her Rt hand/wrist pain but she feels like she cannot continue with her Lt ankle pain. She describes her pain pattern as circumferential at the Lt lower leg from the ankle to about midway up her calf. Pain occurs in standing and with walking primarily. She reports some relief when sitting with feet support  or when laying down. She is following up with Dr. Fredna Dow for EMG/NCV testing for Rt UE. She is unsure why she has not received an MRI yet and feels this would help her to know what is going on. Aside from visits with Dr. Fredna Dow and Octavio Manns she has seen her PCP, or MD for RA, and a chiropractor and no one has told her what is causing her leg pain. She wants to know what is going on and how to fix it. She does state prior experience with PT is hit or miss, she has stopped before when it worsened her pain and had some improvement with mechanical traction in the past.     Pertinent History  Rheumatoid arthritis with Dupuytren's disease    Limitations  Sitting;Walking;Lifting;Standing;House hold activities    How long can you sit comfortably?  a few minutes  (need to have feet and back supported)    How long can you stand comfortably?  ~ 3 minutes    How long can you walk comfortably?  ~ 3 minutes    Patient Stated Goals  to be able to get out of home again and go grocery shopping    Currently in Pain?  Yes   pt seated   Pain Score  3     Pain Location  Leg    Pain Orientation  Left;Lower;Anterior;Posterior    Pain Descriptors / Indicators  Aching   "toothache"   Pain Type  Chronic pain    Pain Onset  More than a month ago    Pain Frequency  Constant    Aggravating Factors   walking, standing    Pain Relieving Factors  sitting to rest 3-4 minutes, laying down (on Lt side better than Rt)    Effect of Pain on Daily Activities  moderate to severe (pt feels like she cannot leave home)         [facility] PT Assessment - 08/20/18 0001      Assessment   Medical Diagnosis  Low Back Pain with Lt LE Symptoms    Referring Provider (PT)  Octavio Manns, Elwyn Lade, MD    Onset Date/Surgical Date  --   ~1.5 years ago   Prior Therapy  yes in past with mixed success      Precautions   Precautions  None      Restrictions   Weight Bearing Restrictions  No      Balance Screen   Has the patient fallen in the past 6 months  Yes    How many times?  unsure at least 4 times    Has the patient had a decrease in activity level because of a fear of falling?   Yes    Is the patient reluctant to leave their home because of a fear of falling?   Yes      [place]  Private residence    Living Arrangements  Alone   pt husbands passed away 02/15/18   Available Help at Discharge  Family   adult children   [place] - single point    Additional Comments  pt uses SPC to staedy herself while mobilizing; she is currently unable to go out to the food store and with household cleaning; the only time she leaves her home is for doctors appointments      Prior Function   Level of [place]  Retired  Cognition   Overall Cognitive Status  Within Functional Limits for tasks assessed      ROM / Strength   AROM / PROM / Strength  AROM;Strength      AROM   AROM Assessment Site  Lumbar    Lumbar Flexion  60    Lumbar Extension  10   relief sometimes   Lumbar - Right Side Bend  25   pain increase in Lt LE   Lumbar - Left Side Bend  20      Strength   Strength Assessment Site  Hip;Knee    Right Hip Flexion  3+/5    Right Hip Extension  3/5    Right Hip ABduction  3+/5    Left Hip Flexion  2+/5    Left Hip Extension  3/5    Left Hip ABduction  3+/5    Right/Left Knee  Right;Left    Right Knee Flexion  3+/5    Left Knee Flexion  3+/5      Special Tests    Special Tests  Lumbar    Lumbar Tests  Straight Leg Raise        Objective measurements completed on examination: See above findings.      [place] Adult PT Treatment/Exercise - 08/20/18 0001      Exercises   Exercises  Lumbar      Lumbar Exercises: Stretches   Standing Extension  10 reps;10 seconds;Limitations    Standing Extension Limitations  at counter        PT Education - 08/20/18 1020    Education Details  Educated patient on likely presentation of symptoms and how it can relate to lower back. Educated on inflammatory factors being an influence on her symptoms and the purpose of exercises to "pump" fuid in joint space and redcue inflammotry factors in lumbar spine. Provided handout for initial HEP. Educated on lumbar roll for postural support in car ride. Educated on interventions available here as pt reports success inpast with traction machine for low back.    Person(s) Educated  Patient    Methods  Explanation;Handout;Demonstration    Comprehension  Verbalized understanding;Returned demonstration       PT Short Term Goals - 08/20/18 1045      PT SHORT TERM GOAL #1   Title  Patient will be independent with HEP, updated PRN, to improve posture and reduce distal Lt LE symptoms.    Time  2    Period  Weeks     Status  New    Target Date  09/03/18      PT SHORT TERM GOAL #2   Title  Patient will perform all AROM for lumbar spine with no increase in distal symptoms of Lt LE to indicate improved mobility tolerance.    Time  3    Period  Weeks    Status  New    Target Date  09/10/18      PT SHORT TERM GOAL #3   Title  Patient will report being able to stand for ~10 minutes before requiring s seated rest break to alleviate Lt leg pain to increase ability to perform household activities.     Time  3    Period  Weeks    Status  New        PT Long Term Goals - 08/20/18 1047      PT LONG TERM GOAL #1   Title  Patient will report being able to stand/alk for 20 minutes at  a time to be able to complete tasks around home such as prepare a meal, laundry, clean without an increase in Lt Leg pain.     Time  6    Period  Weeks    Status  New    Target Date  10/01/18      PT LONG TERM GOAL #2   Title  Patient will make significant improvement in LE strength to increase functional strength for improved activity tolerance.     Time  6    Period  Weeks    Status  New      PT LONG TERM GOAL #3   Title  Patient will report being able to go to the grocery store and ambulate with push cart for 20-30 minutes with no increase in Lt LE pain.    Time  6    Period  Weeks    Status  New      PT LONG TERM GOAL #4   Title  Patient will verbalize benefit fo aerobic exercise on pain management in inflammation reduction to manage independently with exercises.    Time  6    Period  Weeks    Status  New        Plan - 08/20/18 1028    Clinical Impression Statement  Ms. Wolter presents to physical therapy for evaluation of Lt lower leg pain that is possibly related to lumbar spine pathology. Her pain pattern is not typical of a lumbar radiculopathy and is located circumferentially around her Lt lower leg from her ankle to approximately mid calf level. She describes her pain as a dull ache and denies typical  neurologic symptoms. Her pain/symptoms were provocked with lumbar flexion and Rt side bend, and relieved somewhat with lumbar extension. Sensations is intact to light touch and she denies typical neurologic pain descriptors. She has reported some stress incontinence with difficulty holding bladder, but denies bowel changes and has no trouble emptying bladder. Ms. Lubben has a history of rheumatoid arthritis and she may be experiencing pain radiating from her Lt ankle. Further ankle testing is necessary to determine this influence. She will benefit from skilled PT interventions to address chronic pain and current impairments.    Personal Factors and Comorbidities  Age;Other   bipolar disorder with severe depression   Examination-Activity Limitations  Lift;Squat;Stand;Transfers;Continence;Locomotion Level    Examination-Participation Restrictions  Cleaning;Laundry;Community Activity;Driving    Stability/Clinical Decision Making  Evolving/Moderate complexity    Clinical Decision Making  Moderate    Rehab Potential  Fair    PT Frequency  2x / week    PT Duration  6 weeks    PT Treatment/Interventions  ADLs/Self Care Home Management;Aquatic Therapy;Cryotherapy;Electrical Stimulation;Iontophoresis 59m/ml Dexamethasone;Moist Heat;Traction;Functional mobility training;Therapeutic activities;Therapeutic exercise;Balance training;Neuromuscular re-education;Manual techniques;Patient/family education;Passive range of motion    PT Next Visit Plan  Review eval and goals. Complete ankle strength testing and ankle ROM testing (considering ankle as source of pain). Initiate mechanical traction for lumbar spine and provide hip strengthening exercises. Follow up on car trip if pt went and on HEP.    PT Home Exercise Plan  Eval: standing extension;     Consulted and Agree with Plan of Care  Patient       Patient will benefit from skilled therapeutic intervention in order to improve the following deficits and  impairments:  Abnormal gait, Pain, Decreased mobility, Postural dysfunction, Decreased activity tolerance, Difficulty walking, Decreased strength, Impaired tone, Impaired flexibility, Decreased balance  Visit Diagnosis: Chronic left-sided low back  pain with left-sided sciatica  Abnormal posture  Muscle weakness (generalized)     Problem List Patient Active Problem List   Diagnosis Date Noted  . Constipation in female 06/22/2017  . Fall 05/07/2017  . Right rib fracture 05/07/2017  . AKI (acute kidney injury) ([place]) 05/04/2017  . Hyponatremia 05/03/2017  . Bipolar disorder with severe depression ([place]) 11/23/2016  . Hypertension 11/23/2016  . Rheumatoid arthritis involving both hands with positive rheumatoid factor ([place]) 06/01/2016  . Partial small bowel obstruction ([place]) 03/12/2015  . Depression 03/12/2015  . Hypotension 03/12/2015  . Lactic acidosis 03/12/2015  . Dehydration 03/12/2015  . Small bowel obstruction ([place]) 03/12/2015  . Hypothyroidism 02/21/2014  . 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 Major depressive disorder, recurrent episode with anxious distress ([place]) 02/13/2014  . Tachycardia 08/24/2013  . S/P arthroscopy of shoulder 08/14/2013  . Muscle weakness (generalized) 06/14/2013  . Rotator cuff syndrome of right shoulder 12/05/2012  . Right shoulder pain 12/05/2012  . Rectal bleeding 06/06/2011  . Epigastric pain 04/19/2011  . GERD (gastroesophageal reflux disease) 04/19/2011  . IBS (irritable bowel syndrome) 04/19/2011  . METATARSALGIA 05/12/2009  . BUNION 05/12/2009    Kipp Brood, PT, DPT, [facility] Physical Therapist with [facility]  08/20/2018 3:08 PM    [place] [address] [place], [place], [address] Phone: [phone]   Fax:  [phone]  Name: Erica [name] MRN: [id number] Date of Birth: January 04, 1933

## 2018-08-20 NOTE — Patient Instructions (Signed)
Standing Lumbar Extension with Counter reps: 10 sets: 1 hold: 10 seconds daily: 1  weekly: 7      Exercise image step 1   Exercise image step 2 perform throughout the day ~ every hour or when standing  Setup  Begin in a standing upright position in front of a counter, with your hands resting on your hips. Movement  Slowly arch your trunk backwards and hold. Tip  Make sure to use the counter for balance as needed and do not bend your knees during the exercise.

## 2018-08-22 ENCOUNTER — Ambulatory Visit (HOSPITAL_COMMUNITY): Payer: Medicare Other | Admitting: Physical Therapy

## 2018-08-22 ENCOUNTER — Telehealth (HOSPITAL_COMMUNITY): Payer: Self-pay | Admitting: Physical Therapy

## 2018-08-22 NOTE — Telephone Encounter (Signed)
No-show #1, however upon speaking to patient she stated she did not know she had the appointment this date. She said it was not on her sheet. Therapist stated that it was okay, and that she was glad patient was alright. Reminded patient of next scheduled appointment.   Clarene Critchley PT, DPT 11:23 AM, 08/22/18 [phone]

## 2018-08-29 ENCOUNTER — Ambulatory Visit (HOSPITAL_COMMUNITY): Payer: Medicare Other

## 2018-08-29 ENCOUNTER — Other Ambulatory Visit: Payer: Self-pay

## 2018-08-29 ENCOUNTER — Encounter (HOSPITAL_COMMUNITY): Payer: Self-pay

## 2018-08-29 DIAGNOSIS — R293 Abnormal posture: Secondary | ICD-10-CM | POA: Diagnosis not present

## 2018-08-29 DIAGNOSIS — G8929 Other chronic pain: Secondary | ICD-10-CM | POA: Diagnosis not present

## 2018-08-29 DIAGNOSIS — M5442 Lumbago with sciatica, left side: Secondary | ICD-10-CM

## 2018-08-29 DIAGNOSIS — M6281 Muscle weakness (generalized): Secondary | ICD-10-CM

## 2018-08-29 NOTE — Therapy (Signed)
[place], [place], [address] Phone: [phone]   Fax:  [phone]  Physical Therapy Treatment  Patient Details  Name: Erica [name] MRN: [id number] Date of Birth: 01/01/1933 Referring Provider (PT): Sherron Ales, MD   Encounter Date: 08/29/2018  PT End of Session - 08/29/18 1516    Visit Number  2    Number of Visits  12    Date for PT Re-Evaluation  10/01/18    Authorization Type  BCBS Medicare ($40 co-pay, no auth, no limit)    Authorization Time Period  08/20/2018-7/20-2020    Authorization - Visit Number  2    Authorization - Number of Visits  10    PT Start Time  4917    PT Stop Time  1606    PT Time Calculation (min)  52 min    Equipment Utilized During Treatment  Gait belt    Activity Tolerance  Patient tolerated treatment well    Behavior During Therapy  WFL for tasks assessed/performed       Past Medical History:  Diagnosis Date  . Anxiety   . Bipolar disorder ([place])   . Hyperlipidemia   . Hypothyroidism   . Irritable bowel syndrome   . RA (rheumatoid arthritis) ([place])     Past Surgical History:  Procedure Laterality Date  . ABDOMINAL HYSTERECTOMY    . Bone Spurs  08/07/13   Right Shoulder  . COLONOSCOPY    . COLONOSCOPY N/A 05/08/2012   Procedure: COLONOSCOPY;  Surgeon: Rogene Houston, MD;  [facility];  Service: Endoscopy;  Laterality: N/A;  730  . COLOSTOMY    . ESOPHAGOGASTRODUODENOSCOPY N/A 11/04/2013   Procedure: ESOPHAGOGASTRODUODENOSCOPY (EGD);  Surgeon: Rogene Houston, MD;  [facility];  Service: Endoscopy;  Laterality: N/A;  730  . EYE SURGERY  cataract x 2  . MALONEY DILATION N/A 11/04/2013   Procedure: Venia Minks DILATION;  Surgeon: Rogene Houston, MD;  [facility];  Service: Endoscopy;  Laterality: N/A;  . UPPER GASTROINTESTINAL ENDOSCOPY      There were no vitals filed for this visit.  Subjective Assessment - 08/29/18 1517     Subjective  Pt reports that she is hurting a little more today from the rain. Reports her L calf/ankle about 8.5-9/10 today. States she feels like it is a pinched nerve. She states that she rode for 9 hours in the car and it went so-so.    Pertinent History  Rheumatoid arthritis with Dupuytren's disease    Limitations  Sitting;Walking;Lifting;Standing;House hold activities    How long can you sit comfortably?  a few minutes (need to have feet and back supported)    How long can you stand comfortably?  ~ 3 minutes    How long can you walk comfortably?  ~ 3 minutes    Patient Stated Goals  to be able to get out of home again and go grocery shopping    Currently in Pain?  Yes    Pain Score  9     Pain Location  Leg    Pain Orientation  Left;Lower;Anterior    Pain Descriptors / Indicators  Aching    Pain Type  Chronic pain    Pain Onset  More than a month ago    Pain Frequency  Constant    Aggravating Factors   walking, standing    Pain Relieving Factors  sitting to rest 3-4 mins, laying down (  on L side better than R)    Effect of Pain on Daily Activities  moderate to severe (pt feels like she cannot leave home)         [facility] PT Assessment - 08/29/18 0001      Assessment   Medical Diagnosis  Low Back Pain with Lt LE Symptoms    Referring Provider (PT)  Octavio Manns, Elwyn Lade, MD    Onset Date/Surgical Date  --   ~1.5 years ago   Prior Therapy  yes in past with mixed success      Observation/Other Assessments   Observations  mild-mod longitudinal arch collapse L>R (transverse arch also collapsed L>R) in WB -- increased R calcaneal valgus/inversion on L with increased hallux valgus on R with minimal calcaneal valgus/inversion      AROM   AROM Assessment Site  Ankle    Right/Left Ankle  Left;Right    Right Ankle Dorsiflexion  10    Right Ankle Plantar Flexion  77    Right Ankle Inversion  60    Right Ankle Eversion  35    Left Ankle Dorsiflexion  5    Left Ankle Plantar Flexion  75     Left Ankle Inversion  35    Left Ankle Eversion  35      Strength   Strength Assessment Site  Ankle    Right Ankle Dorsiflexion  5/5    Right Ankle Inversion  4+/5    Right Ankle Eversion  4+/5    Left Ankle Dorsiflexion  5/5    Left Ankle Inversion  4+/5    Left Ankle Eversion  4+/5      Palpation   Palpation comment  L anterior tib tighter and tender to palpation but did not recreate her same pain      Special Tests    Special Tests  --    Lumbar Tests  --             PT Education - 08/29/18 1515    Education Details  Reviewed goals, HEP, exercise technique    Person(s) Educated  Patient    Methods  Explanation    Comprehension  Verbalized understanding       PT Short Term Goals - 08/20/18 1045      PT SHORT TERM GOAL #1   Title  Patient will be independent with HEP, updated PRN, to improve posture and reduce distal Lt LE symptoms.    Time  2    Period  Weeks    Status  New    Target Date  09/03/18      PT SHORT TERM GOAL #2   Title  Patient will perform all AROM for lumbar spine with no increase in distal symptoms of Lt LE to indicate improved mobility tolerance.    Time  3    Period  Weeks    Status  New    Target Date  09/10/18      PT SHORT TERM GOAL #3   Title  Patient will report being able to stand for ~10 minutes before requiring s seated rest break to alleviate Lt leg pain to increase ability to perform household activities.     Time  3    Period  Weeks    Status  New        PT Long Term Goals - 08/20/18 1047      PT LONG TERM GOAL #1   Title  Patient will report being able  to stand/alk for 20 minutes at a time to be able to complete tasks around home such as prepare a meal, laundry, clean without an increase in Lt Leg pain.     Time  6    Period  Weeks    Status  New    Target Date  10/01/18      PT LONG TERM GOAL #2   Title  Patient will make significant improvement in LE strength to increase functional strength for improved activity  tolerance.     Time  6    Period  Weeks    Status  New      PT LONG TERM GOAL #3   Title  Patient will report being able to go to the grocery store and ambulate with push cart for 20-30 minutes with no increase in Lt LE pain.    Time  6    Period  Weeks    Status  New      PT LONG TERM GOAL #4   Title  Patient will verbalize benefit fo aerobic exercise on pain management in inflammation reduction to manage independently with exercises.    Time  6    Period  Weeks    Status  New            Plan - 08/29/18 1516    Clinical Impression Statement  Began session by reviewing goals with no f/u questions afterwards. Most of session focused on L ankle and assessing for any contributing factors to her L anterior leg pain. Pt tender with palpation and noted restrictions/trigger points in L anterior tib, but it did not recreate her same pain. Tinel's sign negative at common fibular nerve at head of fibular and at posterior tibial/sural nerve behind lateral malleolus. Palpation to glutes and lumbar paraspinals tender at point of palpation, but did not recreate same pain in LLE. Assessed for femoral nerve tightness but it was negative/WFL. Overall, still unclear etiology of pt's anterior L leg pain. She was noted to have mild cyanosis of bil toes; inquired about arterial supply and she denied any significant h/o arterial insufficiency. Ended session with manual STM to reduce restrictions in lower back in order to reduce restrictions and facilitate improved neural mobility. Pt continues to express interest in traction so recommend initiating this next visit.    PT Treatment/Interventions  ADLs/Self Care Home Management;Aquatic Therapy;Cryotherapy;Electrical Stimulation;Iontophoresis 45m/ml Dexamethasone;Moist Heat;Traction;Functional mobility training;Therapeutic activities;Therapeutic exercise;Balance training;Neuromuscular re-education;Manual techniques;Patient/family education;Passive range of motion     PT Next Visit Plan  Initiate mechanical traction for lumbar spine and provide hip strengthening exercises.    PT Home Exercise Plan  Eval: standing extension;     Consulted and Agree with Plan of Care  Patient       Patient will benefit from skilled therapeutic intervention in order to improve the following deficits and impairments:  Abnormal gait, Pain, Decreased mobility, Postural dysfunction, Decreased activity tolerance, Difficulty walking, Decreased strength, Impaired tone, Impaired flexibility, Decreased balance  Visit Diagnosis: 1. Chronic left-sided low back pain with left-sided sciatica   2. Abnormal posture   3. Muscle weakness (generalized)        Problem List Patient Active Problem List   Diagnosis Date Noted  . Constipation in female 06/22/2017  . Fall 05/07/2017  . Right rib fracture 05/07/2017  . AKI (acute kidney injury) ([place] 05/04/2017  . Hyponatremia 05/03/2017  . Bipolar disorder with severe depression ([place] 11/23/2016  . Hypertension 11/23/2016  . Rheumatoid arthritis involving both hands  with positive rheumatoid factor ([place]) 06/01/2016  . Partial small bowel obstruction ([place]) 03/12/2015  . Depression 03/12/2015  . Hypotension 03/12/2015  . Lactic acidosis 03/12/2015  . Dehydration 03/12/2015  . Small bowel obstruction ([place]) 03/12/2015  . Hypothyroidism 02/21/2014  . 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 Major depressive disorder, recurrent episode with anxious distress ([place]) 02/13/2014  . Tachycardia 08/24/2013  . S/P arthroscopy of shoulder 08/14/2013  . Muscle weakness (generalized) 06/14/2013  . Rotator cuff syndrome of right shoulder 12/05/2012  . Right shoulder pain 12/05/2012  . Rectal bleeding 06/06/2011  . Epigastric pain 04/19/2011  . GERD (gastroesophageal reflux disease) 04/19/2011  . IBS (irritable bowel syndrome) 04/19/2011  . METATARSALGIA 05/12/2009  . BUNION 05/12/2009       Geraldine Solar PT, DPT   [facility] [address] [place], [place], [address] Phone: [phone]   Fax:  [phone]  Name: Erica [name] MRN: [id number] Date of Birth: [birth year]

## 2018-08-31 ENCOUNTER — Ambulatory Visit (HOSPITAL_COMMUNITY): Payer: Medicare Other

## 2018-08-31 ENCOUNTER — Encounter (HOSPITAL_COMMUNITY): Payer: Self-pay

## 2018-08-31 ENCOUNTER — Other Ambulatory Visit: Payer: Self-pay

## 2018-08-31 DIAGNOSIS — M5442 Lumbago with sciatica, left side: Secondary | ICD-10-CM | POA: Diagnosis not present

## 2018-08-31 DIAGNOSIS — G8929 Other chronic pain: Secondary | ICD-10-CM | POA: Diagnosis not present

## 2018-08-31 DIAGNOSIS — R293 Abnormal posture: Secondary | ICD-10-CM

## 2018-08-31 DIAGNOSIS — M6281 Muscle weakness (generalized): Secondary | ICD-10-CM | POA: Diagnosis not present

## 2018-08-31 NOTE — Therapy (Signed)
[facility], [place], [address] Phone: [phone]   Fax:  [phone]  Physical Therapy Treatment  Patient Details  Name: Erica [name] MRN: [id number] Date of Birth: [birth year] Referring Provider (PT): Sherron Ales, MD   Encounter Date: 08/31/2018  PT End of Session - 08/31/18 1640    Visit Number  3    Number of Visits  12    Date for PT Re-Evaluation  10/01/18    Authorization Type  BC[employer] Medicare ($40 co-pay, no auth, no limit)    Authorization Time Period  08/20/2018-7/20-2020    Authorization - Visit Number  3    Authorization - Number of Visits  10    PT Start Time  4825    PT Stop Time  1704    PT Time Calculation (min)  43 min    Equipment Utilized During Treatment  Gait belt    Activity Tolerance  Patient tolerated treatment well    Behavior During Therapy  WFL for tasks assessed/performed       Past Medical History:  Diagnosis Date  . Anxiety   . Bipolar disorder ([place])   . Hyperlipidemia   . Hypothyroidism   . Irritable bowel syndrome   . RA (rheumatoid arthritis) ([place])     Past Surgical History:  Procedure Laterality Date  . ABDOMINAL HYSTERECTOMY    . Bone Spurs  08/07/13   Right Shoulder  . COLONOSCOPY    . COLONOSCOPY N/A 05/08/2012   Procedure: COLONOSCOPY;  Surgeon: Rogene Houston, MD;  [facility];  Service: Endoscopy;  Laterality: N/A;  730  . COLOSTOMY    . ESOPHAGOGASTRODUODENOSCOPY N/A 11/04/2013   Procedure: ESOPHAGOGASTRODUODENOSCOPY (EGD);  Surgeon: Rogene Houston, MD;  [facility];  Service: Endoscopy;  Laterality: N/A;  730  . EYE SURGERY  cataract x 2  . MALONEY DILATION N/A 11/04/2013   Procedure: Venia Minks DILATION;  Surgeon: Rogene Houston, MD;  [facility];  Service: Endoscopy;  Laterality: N/A;  . UPPER GASTROINTESTINAL ENDOSCOPY      There were no vitals filed for this visit.  Subjective Assessment - 08/31/18 1631     Subjective  Patietn reports she stopped every 2 hours when she took the trip up [place] with her son and daughter-in-law. She did her exercises each time and they helped some. She is have a lot of pain in her Lt leg today and states it is still in between her knee and ankle.    Pertinent History  Rheumatoid arthritis with Dupuytren's disease    Limitations  Sitting;Walking;Lifting;Standing;House hold activities    How long can you sit comfortably?  a few minutes (need to have feet and back supported)    How long can you stand comfortably?  ~ 3 minutes    How long can you walk comfortably?  ~ 3 minutes    Patient Stated Goals  to be able to get out of home again and go grocery shopping    Currently in Pain?  Yes    Pain Score  7     Pain Location  Leg    Pain Orientation  Left;Lower;Anterior    Pain Descriptors / Indicators  Aching    Pain Type  Chronic pain    Pain Onset  More than a month ago    Pain Frequency  Constant    Aggravating Factors   walking, standing  [facility] Adult PT Treatment/Exercise - 08/31/18 0001      Exercises   Exercises  Lumbar      Lumbar Exercises: Stretches   Prone on Elbows Stretch  10 seconds    Prone on Elbows Stretch Limitations  10 reps      Lumbar Exercises: Supine   Bridge  10 reps;3 seconds    Bridge Limitations  2 sets      Lumbar Exercises: Sidelying   Clam  Both;15 reps;2 seconds      Modalities   Modalities  Traction      Traction   Type of Traction  Lumbar    Min (lbs)  0    Max (lbs)  50    Hold Time  sustained 10 minutes (1 minute step up before and after sustained period - 6 steps)    Rest Time  0    Time  12 minutes total      Manual Therapy   Manual Therapy  Joint mobilization    Manual therapy comments  seperate from other interventions    Joint Mobilization  3x 30 seconds AP glide, Grade III to Lt talocrural joint        PT Education - 08/31/18 1721    Education Details  educated on updated HEP, educated on traction  and when to stop treatement.    Person(s) Educated  Patient    Methods  Explanation;Handout    Comprehension  Verbalized understanding;Returned demonstration       PT Short Term Goals - 08/20/18 1045      PT SHORT TERM GOAL #1   Title  Patient will be independent with HEP, updated PRN, to improve posture and reduce distal Lt LE symptoms.    Time  2    Period  Weeks    Status  New    Target Date  09/03/18      PT SHORT TERM GOAL #2   Title  Patient will perform all AROM for lumbar spine with no increase in distal symptoms of Lt LE to indicate improved mobility tolerance.    Time  3    Period  Weeks    Status  New    Target Date  09/10/18      PT SHORT TERM GOAL #3   Title  Patient will report being able to stand for ~10 minutes before requiring s seated rest break to alleviate Lt leg pain to increase ability to perform household activities.     Time  3    Period  Weeks    Status  New        PT Long Term Goals - 08/20/18 1047      PT LONG TERM GOAL #1   Title  Patient will report being able to stand/alk for 20 minutes at a time to be able to complete tasks around home such as prepare a meal, laundry, clean without an increase in Lt Leg pain.     Time  6    Period  Weeks    Status  New    Target Date  10/01/18      PT LONG TERM GOAL #2   Title  Patient will make significant improvement in LE strength to increase functional strength for improved activity tolerance.     Time  6    Period  Weeks    Status  New      PT LONG TERM GOAL #3   Title  Patient will report being able  to go to the grocery store and ambulate with push cart for 20-30 minutes with no increase in Lt LE pain.    Time  6    Period  Weeks    Status  New      PT LONG TERM GOAL #4   Title  Patient will verbalize benefit fo aerobic exercise on pain management in inflammation reduction to manage independently with exercises.    Time  6    Period  Weeks    Status  New        Plan - 08/31/18 1640     Clinical Impression Statement  Patient arrived reporting 7/10 pain in her leg at start of session. Discussed findings from ankle evaluation as well and plan to further treat and assess ankle mobility and possible relation to leg pain. This session focused on lumbar spine interventions and initiated lumbar traction as patient has had positive response to this in past. She reported good stretch at max traction of 60lbs and denied any increase in pain in low back. Exercises focused on hip strengthening and stretching lumbar spine and pt demonstrated good form with each exercise. HEP was updated and patient educated that next session we will perform ankle interventions as well to treat possible contributing factors. She is in agreement with this plan. Patient will continue to benefit form skilled PT interventions to relieve pain and improve QOL.    Personal Factors and Comorbidities  Age;Other    Examination-Activity Limitations  Lift;Squat;Stand;Transfers;Continence;Locomotion Level    Examination-Participation Restrictions  Cleaning;Laundry;Community Activity;Driving    PT Treatment/Interventions  ADLs/Self Care Home Management;Aquatic Therapy;Cryotherapy;Electrical Stimulation;Iontophoresis 65m/ml Dexamethasone;Moist Heat;Traction;Functional mobility training;Therapeutic activities;Therapeutic exercise;Balance training;Neuromuscular re-education;Manual techniques;Patient/family education;Passive range of motion    PT Next Visit Plan  Follow up on pt's response to mechanical traction for lumbar spine and continue if postive. Continue hip strengthening exercises. Initate ankle mobilization to Lt ankle and assess response.    PT Home Exercise Plan  Eval: standing extension; 08/31/18- bridge, clamshell    Consulted and Agree with Plan of Care  Patient       Patient will benefit from skilled therapeutic intervention in order to improve the following deficits and impairments:  Abnormal gait, Pain, Decreased  mobility, Postural dysfunction, Decreased activity tolerance, Difficulty walking, Decreased strength, Impaired tone, Impaired flexibility, Decreased balance  Visit Diagnosis: 1. Chronic left-sided low back pain with left-sided sciatica   2. Abnormal posture   3. Muscle weakness (generalized)        Problem List Patient Active Problem List   Diagnosis Date Noted  . Constipation in female 06/22/2017  . Fall 05/07/2017  . Right rib fracture 05/07/2017  . AKI (acute kidney injury) ([facility] 05/04/2017  . Hyponatremia 05/03/2017  . Bipolar disorder with severe depression ([facility] 11/23/2016  . Hypertension 11/23/2016  . Rheumatoid arthritis involving both hands with positive rheumatoid factor ([facility] 06/01/2016  . Partial small bowel obstruction ([facility] 03/12/2015  . Depression 03/12/2015  . Hypotension 03/12/2015  . Lactic acidosis 03/12/2015  . Dehydration 03/12/2015  . Small bowel obstruction ([place] 03/12/2015  . Hypothyroidism 02/21/2014  . 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 Major depressive disorder, recurrent episode with anxious distress ([facility] 02/13/2014  . Tachycardia 08/24/2013  . S/P arthroscopy of shoulder 08/14/2013  . Muscle weakness (generalized) 06/14/2013  . Rotator cuff syndrome of right shoulder 12/05/2012  . Right shoulder pain 12/05/2012  . Rectal bleeding 06/06/2011  . Epigastric pain 04/19/2011  . GERD (gastroesophageal reflux disease) 04/19/2011  . IBS (irritable bowel  syndrome) 04/19/2011  . METATARSALGIA 05/12/2009  . BUNION 05/12/2009    Kipp Brood, PT, DPT, [facility] Physical Therapist with [facility]  08/31/2018 5:21 PM   [facility] [address] [place], [place], [address] Phone: [phone]   Fax:  [phone]  Name: JORDEN MAHL MRN: [id number] Date of Birth: 1933-02-04

## 2018-09-04 ENCOUNTER — Other Ambulatory Visit: Payer: Self-pay

## 2018-09-04 ENCOUNTER — Encounter (HOSPITAL_COMMUNITY): Payer: Self-pay | Admitting: Physical Therapy

## 2018-09-04 ENCOUNTER — Other Ambulatory Visit (HOSPITAL_COMMUNITY): Payer: Self-pay | Admitting: Internal Medicine

## 2018-09-04 ENCOUNTER — Ambulatory Visit (HOSPITAL_COMMUNITY): Payer: Medicare Other | Admitting: Physical Therapy

## 2018-09-04 DIAGNOSIS — M5442 Lumbago with sciatica, left side: Secondary | ICD-10-CM

## 2018-09-04 DIAGNOSIS — Z6821 Body mass index (BMI) 21.0-21.9, adult: Secondary | ICD-10-CM | POA: Diagnosis not present

## 2018-09-04 DIAGNOSIS — M0589 Other rheumatoid arthritis with rheumatoid factor of multiple sites: Secondary | ICD-10-CM | POA: Diagnosis not present

## 2018-09-04 DIAGNOSIS — R293 Abnormal posture: Secondary | ICD-10-CM | POA: Diagnosis not present

## 2018-09-04 DIAGNOSIS — M6281 Muscle weakness (generalized): Secondary | ICD-10-CM | POA: Diagnosis not present

## 2018-09-04 DIAGNOSIS — Z79899 Other long term (current) drug therapy: Secondary | ICD-10-CM | POA: Diagnosis not present

## 2018-09-04 DIAGNOSIS — G8929 Other chronic pain: Secondary | ICD-10-CM

## 2018-09-04 DIAGNOSIS — M65341 Trigger finger, right ring finger: Secondary | ICD-10-CM | POA: Diagnosis not present

## 2018-09-04 DIAGNOSIS — Z780 Asymptomatic menopausal state: Secondary | ICD-10-CM

## 2018-09-04 NOTE — Therapy (Signed)
[place], [place], [address] Phone: [phone]   Fax:  [phone]  Physical Therapy Treatment  Patient Details  Name: Erica [name] MRN: [id number] Date of Birth: Nov 21, 1932 Referring Provider (PT): Sherron Ales, MD   Encounter Date: 09/04/2018  PT End of Session - 09/04/18 1347    Visit Number  4    Number of Visits  12    Date for PT Re-Evaluation  10/01/18    Authorization Type  [employer] Medicare ($40 co-pay, no auth, no limit)    Authorization Time Period  08/20/2018-7/20-2020    Authorization - Visit Number  4    Authorization - Number of Visits  10    PT Start Time  0071    PT Stop Time  1411    PT Time Calculation (min)  38 min    Equipment Utilized During Treatment  Gait belt    Activity Tolerance  Patient tolerated treatment well    Behavior During Therapy  WFL for tasks assessed/performed       Past Medical History:  Diagnosis Date  . Anxiety   . Bipolar disorder ([place])   . Hyperlipidemia   . Hypothyroidism   . Irritable bowel syndrome   . RA (rheumatoid arthritis) ([place])     Past Surgical History:  Procedure Laterality Date  . ABDOMINAL HYSTERECTOMY    . Bone Spurs  08/07/13   Right Shoulder  . COLONOSCOPY    . COLONOSCOPY N/A 05/08/2012   Procedure: COLONOSCOPY;  Surgeon: Rogene Houston, MD;  [facility];  Service: Endoscopy;  Laterality: N/A;  730  . COLOSTOMY    . ESOPHAGOGASTRODUODENOSCOPY N/A 11/04/2013   Procedure: ESOPHAGOGASTRODUODENOSCOPY (EGD);  Surgeon: Rogene Houston, MD;  [facility];  Service: Endoscopy;  Laterality: N/A;  730  . EYE SURGERY  cataract x 2  . MALONEY DILATION N/A 11/04/2013   Procedure: Venia Minks DILATION;  Surgeon: Rogene Houston, MD;  [facility];  Service: Endoscopy;  Laterality: N/A;  . UPPER GASTROINTESTINAL ENDOSCOPY      There were no vitals filed for this visit.  Subjective Assessment - 09/04/18 1338    Subjective  Patient reported that the pain she is having today is severe and she rates it a 9/10.    Pertinent History  Rheumatoid arthritis with Dupuytren's disease    Limitations  Sitting;Walking;Lifting;Standing;House hold activities    How long can you sit comfortably?  a few minutes (need to have feet and back supported)    How long can you stand comfortably?  ~ 3 minutes    How long can you walk comfortably?  ~ 3 minutes    Patient Stated Goals  to be able to get out of home again and go grocery shopping    Currently in Pain?  Yes    Pain Score  9     Pain Location  Leg    Pain Orientation  Left    Pain Descriptors / Indicators  Aching    Pain Type  Chronic pain    Pain Onset  More than a month ago                       [facility] Adult PT Treatment/Exercise - 09/04/18 0001      Exercises   Exercises  Lumbar      Lumbar Exercises: Supine   Other Supine Lumbar Exercises  Decompression exercises: Relax  on back x 2 minutes; Head press x 10, shoulder press, leg press, leg lengthener x 10 5'' holds each      Lumbar Exercises: Sidelying   Clam  Both;15 reps;2 seconds      Modalities   Modalities  Traction      Traction   Type of Traction  Lumbar    Min (lbs)  0    Max (lbs)  50    Hold Time  sustained 10 minutes (1 minute step up before and after sustained period - 6 steps)    Rest Time  0    Time  12 minutes total               PT Short Term Goals - 08/20/18 1045      PT SHORT TERM GOAL #1   Title  Patient will be independent with HEP, updated PRN, to improve posture and reduce distal Lt LE symptoms.    Time  2    Period  Weeks    Status  New    Target Date  09/03/18      PT SHORT TERM GOAL #2   Title  Patient will perform all AROM for lumbar spine with no increase in distal symptoms of Lt LE to indicate improved mobility tolerance.    Time  3    Period  Weeks    Status  New    Target Date  09/10/18      PT SHORT TERM GOAL #3   Title   Patient will report being able to stand for ~10 minutes before requiring s seated rest break to alleviate Lt leg pain to increase ability to perform household activities.     Time  3    Period  Weeks    Status  New        PT Long Term Goals - 08/20/18 1047      PT LONG TERM GOAL #1   Title  Patient will report being able to stand/alk for 20 minutes at a time to be able to complete tasks around home such as prepare a meal, laundry, clean without an increase in Lt Leg pain.     Time  6    Period  Weeks    Status  New    Target Date  10/01/18      PT LONG TERM GOAL #2   Title  Patient will make significant improvement in LE strength to increase functional strength for improved activity tolerance.     Time  6    Period  Weeks    Status  New      PT LONG TERM GOAL #3   Title  Patient will report being able to go to the grocery store and ambulate with push cart for 20-30 minutes with no increase in Lt LE pain.    Time  6    Period  Weeks    Status  New      PT LONG TERM GOAL #4   Title  Patient will verbalize benefit fo aerobic exercise on pain management in inflammation reduction to manage independently with exercises.    Time  6    Period  Weeks    Status  New            Plan - 09/04/18 1429    Clinical Impression Statement  Patient with 9/10 pain this session. Modified exercises based on patient's tolerance. This session, initiated decompression exercises level 1. Patient tolerated these well.  Ended session with traction as patient reported that this did provide some relief following last session. Plan to progress patient with hip strengthening as able once patient's pain level has improved. Educated patient that as a modification, she can perform a gluteal set rather than a full bridge to decrease pain level.    Personal Factors and Comorbidities  Age;Other    Examination-Activity Limitations  Lift;Squat;Stand;Transfers;Continence;Locomotion Level     Examination-Participation Restrictions  Cleaning;Laundry;Community Activity;Driving    PT Treatment/Interventions  ADLs/Self Care Home Management;Aquatic Therapy;Cryotherapy;Electrical Stimulation;Iontophoresis 51m/ml Dexamethasone;Moist Heat;Traction;Functional mobility training;Therapeutic activities;Therapeutic exercise;Balance training;Neuromuscular re-education;Manual techniques;Patient/family education;Passive range of motion    PT Next Visit Plan  Follow up on pt's response to mechanical traction for lumbar spine and continue if postive. Continue hip strengthening exercises as tolerated. Continue decompression exercises if tolerated well.    PT Home Exercise Plan  Eval: standing extension; 08/31/18- bridge, clamshell    Consulted and Agree with Plan of Care  Patient       Patient will benefit from skilled therapeutic intervention in order to improve the following deficits and impairments:  Abnormal gait, Pain, Decreased mobility, Postural dysfunction, Decreased activity tolerance, Difficulty walking, Decreased strength, Impaired tone, Impaired flexibility, Decreased balance  Visit Diagnosis: 1. Chronic left-sided low back pain with left-sided sciatica   2. Abnormal posture   3. Muscle weakness (generalized)        Problem List Patient Active Problem List   Diagnosis Date Noted  . Constipation in female 06/22/2017  . Fall 05/07/2017  . Right rib fracture 05/07/2017  . AKI (acute kidney injury) ([facility] 05/04/2017  . Hyponatremia 05/03/2017  . Bipolar disorder with severe depression ([facility] 11/23/2016  . Hypertension 11/23/2016  . Rheumatoid arthritis involving both hands with positive rheumatoid factor ([facility] [place] 06/01/2016  . Partial small bowel obstruction ([facility] 03/12/2015  . Depression 03/12/2015  . Hypotension 03/12/2015  . Lactic acidosis 03/12/2015  . Dehydration 03/12/2015  . Small bowel obstruction ([facility] 03/12/2015  . Hypothyroidism 02/21/2014  . 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 Major depressive disorder, recurrent episode with anxious distress ([facility] 02/13/2014  . Tachycardia 08/24/2013  . S/P arthroscopy of shoulder 08/14/2013  . Muscle weakness (generalized) 06/14/2013  . Rotator cuff syndrome of right shoulder 12/05/2012  . Right shoulder pain 12/05/2012  . Rectal bleeding 06/06/2011  . Epigastric pain 04/19/2011  . GERD (gastroesophageal reflux disease) 04/19/2011  . IBS (irritable bowel syndrome) 04/19/2011  . METATARSALGIA 05/12/2009  . BUNION 05/12/2009   MClarene CritchleyPT, DPT 2:32 PM, 09/04/18 [address] StreetSBallenger Creek [place] [address]Phone: [phone]  Fax:  [phone] Name: LASHA GRUMBINEMRN: [id number] of Birth: [birth year]

## 2018-09-05 ENCOUNTER — Ambulatory Visit (HOSPITAL_COMMUNITY): Payer: Medicare Other

## 2018-09-05 ENCOUNTER — Encounter (HOSPITAL_COMMUNITY): Payer: Self-pay

## 2018-09-05 DIAGNOSIS — M5442 Lumbago with sciatica, left side: Secondary | ICD-10-CM | POA: Diagnosis not present

## 2018-09-05 DIAGNOSIS — R293 Abnormal posture: Secondary | ICD-10-CM

## 2018-09-05 DIAGNOSIS — G8929 Other chronic pain: Secondary | ICD-10-CM

## 2018-09-05 DIAGNOSIS — M6281 Muscle weakness (generalized): Secondary | ICD-10-CM | POA: Diagnosis not present

## 2018-09-05 NOTE — Patient Instructions (Signed)
Ask Dr. Ronnie Derby about you ankle being a possible source of left lower leg pain versus your back. Patient's AROM is more limited on Left ankle compared to Right ankle.   Rt Ankle: Dorsiflexion: 10 Plantarflexion: 50 Eversion: 38 Inversion: 50  Lt Ankle: Dorsiflexion: 0 Plantarflexion: 50 Eversion: 32 Inversion: 50  Lumbar ROM on 08/20/18 at evaluation   AROM   AROM Assessment Site  Lumbar    Lumbar Flexion  60    Lumbar Extension  10   relief sometimes   Lumbar - Right Side Bend  25   pain increase in Lt LE   Lumbar - Left Side Bend  20

## 2018-09-05 NOTE — Therapy (Signed)
[place], [place], [address] Phone: [phone]   Fax:  [phone]  Physical Therapy Treatment  Patient Details  Name: Erica [name] MRN: [id number] Date of Birth: March 23, 1932 Referring Provider (PT): Sherron Ales, MD   Encounter Date: 09/05/2018  PT End of Session - 09/05/18 1719    Visit Number  5    Number of Visits  12    Date for PT Re-Evaluation  10/01/18    Authorization Type  BCBS Medicare ($40 co-pay, no auth, no limit)    Authorization Time Period  08/20/2018-7/20-2020    Authorization - Visit Number  5    Authorization - Number of Visits  10    PT Start Time  2229    PT Stop Time  1402    PT Time Calculation (min)  41 min    Equipment Utilized During Treatment  Gait belt    Activity Tolerance  Patient tolerated treatment well    Behavior During Therapy  [facility] for tasks assessed/performed       Past Medical History:  Diagnosis Date  . Anxiety   . Bipolar disorder ([place])   . Hyperlipidemia   . Hypothyroidism   . Irritable bowel syndrome   . RA (rheumatoid arthritis) ([place])     Past Surgical History:  Procedure Laterality Date  . ABDOMINAL HYSTERECTOMY    . Bone Spurs  08/07/13   Right Shoulder  . COLONOSCOPY    . COLONOSCOPY N/A 05/08/2012   Procedure: COLONOSCOPY;  Surgeon: Rogene Houston, MD;  [facility];  Service: Endoscopy;  Laterality: N/A;  730  . COLOSTOMY    . ESOPHAGOGASTRODUODENOSCOPY N/A 11/04/2013   Procedure: ESOPHAGOGASTRODUODENOSCOPY (EGD);  Surgeon: Rogene Houston, MD;  Location[facility];  Service: Endoscopy;  Laterality: N/A;  730  . EYE SURGERY  cataract x 2  . MALONEY DILATION N/A 11/04/2013   Procedure: Venia Minks DILATION;  Surgeon: Rogene Houston, MD;  [facility];  Service: Endoscopy;  Laterality: N/A;  . UPPER GASTROINTESTINAL ENDOSCOPY      There were no vitals filed for this visit.  Subjective Assessment - 09/05/18 1323    Subjective  Patient reports she did get some relief from the traction but it was hard to tell if it is from her being off her feet or from the treatment. She reports yesterday she also saw her endocrinologist about her RA and they said that her leg pain is not likely related to her RA as it would be bilateral in symptoms typically.    Pertinent History  Rheumatoid arthritis with Dupuytren's disease    Limitations  Sitting;Walking;Lifting;Standing;House hold activities    How long can you sit comfortably?  a few minutes (need to have feet and back supported)    How long can you stand comfortably?  ~ 3 minutes    How long can you walk comfortably?  ~ 3 minutes    Patient Stated Goals  to be able to get out of home again and go grocery shopping    Currently in Pain?  Yes    Pain Score  4     Pain Location  Leg    Pain Orientation  Left;Lower;Anterior    Pain Descriptors / Indicators  Aching    Pain Type  Chronic pain    Pain Onset  More than a month ago    Pain Frequency  Constant    Aggravating Factors  standing and walking    Pain Relieving Factors  sitting or laying down        [facility] PT Assessment - 09/05/18 0001      AROM   Right Ankle Dorsiflexion  9    Right Ankle Plantar Flexion  60    Right Ankle Inversion  50    Right Ankle Eversion  38    Left Ankle Dorsiflexion  0    Left Ankle Plantar Flexion  60    Left Ankle Inversion  50    Left Ankle Eversion  32        [facility] Adult PT Treatment/Exercise - 09/05/18 0001      Exercises   Exercises  Lumbar      Lumbar Exercises: Supine   Ab Set  10 reps;Limitations    AB Set Limitations  post tilt with TrA for 10 sec holds      Knee/Hip Exercises: Seated   Other Seated Knee/Hip Exercises  BAPS for Lt Ankle: level 1, 10x dorsiflexion/plantarflexion; 10x clockwise/coutnerclockwise   pt with difficulty with counter clockwise   Other Seated Knee/Hip Exercises  Rockerboard for Lt Ankle: 15x inversion/eversion for AROM      Manual  Therapy   Manual Therapy  Joint mobilization    Manual therapy comments  seperate from other interventions    Joint Mobilization  6x 30-45 seconds AP glide, Grade III to Lt talocrural joint; 3x 30-45 seconds latearl/medial glide Grade III to subtalar joint on Lt ankle        PT Education - 09/05/18 1717    Education Details  Educated on purpose of interventions and provided handout on AROM details for lumbar spine and ankles for patient to share with Dr. Ronnie Derby.    Person(s) Educated  Patient    Methods  Explanation;Handout    Comprehension  Verbalized understanding       PT Short Term Goals - 08/20/18 1045      PT SHORT TERM GOAL #1   Title  Patient will be independent with HEP, updated PRN, to improve posture and reduce distal Lt LE symptoms.    Time  2    Period  Weeks    Status  New    Target Date  09/03/18      PT SHORT TERM GOAL #2   Title  Patient will perform all AROM for lumbar spine with no increase in distal symptoms of Lt LE to indicate improved mobility tolerance.    Time  3    Period  Weeks    Status  New    Target Date  09/10/18      PT SHORT TERM GOAL #3   Title  Patient will report being able to stand for ~10 minutes before requiring s seated rest break to alleviate Lt leg pain to increase ability to perform household activities.     Time  3    Period  Weeks    Status  New        PT Long Term Goals - 08/20/18 1047      PT LONG TERM GOAL #1   Title  Patient will report being able to stand/alk for 20 minutes at a time to be able to complete tasks around home such as prepare a meal, laundry, clean without an increase in Lt Leg pain.     Time  6    Period  Weeks    Status  New    Target Date  10/01/18  PT LONG TERM GOAL #2   Title  Patient will make significant improvement in LE strength to increase functional strength for improved activity tolerance.     Time  6    Period  Weeks    Status  New      PT LONG TERM GOAL #3   Title  Patient will  report being able to go to the grocery store and ambulate with push cart for 20-30 minutes with no increase in Lt LE pain.    Time  6    Period  Weeks    Status  New      PT LONG TERM GOAL #4   Title  Patient will verbalize benefit fo aerobic exercise on pain management in inflammation reduction to manage independently with exercises.    Time  6    Period  Weeks    Status  New            Plan - 09/05/18 1728    Clinical Impression Statement  Patient arrived with 4/10 pain today that resolved with sitting. Session focused on ankle interventions to assess patient's reaction to this treatment. She reported AP glide to Lt TCJ felt good and she did have noted ROM limitation with Lt dorsiflexion and eversion. She was able to ambulate ~ 140 feet before her Lt lower leg became sore and reached a 6-7/10 (prior to ambulation her pain was around 1/10. She had difficulty with ankle proprioceptive training and specifically struggled with ankle inversion. Rockerboard was initiated to trainin motion. Patient was provided additional lumbar exercises this date for abdominal activaiton as she has reported reduced pain with this. She will continue to benefit from skilled PT interventions to address ogoing impairments.    Personal Factors and Comorbidities  Age;Other    Examination-Activity Limitations  Lift;Squat;Stand;Transfers;Continence;Locomotion Level    Examination-Participation Restrictions  Cleaning;Laundry;Community Activity;Driving    PT Treatment/Interventions  ADLs/Self Care Home Management;Aquatic Therapy;Cryotherapy;Electrical Stimulation;Iontophoresis 51m/ml Dexamethasone;Moist Heat;Traction;Functional mobility training;Therapeutic activities;Therapeutic exercise;Balance training;Neuromuscular re-education;Manual techniques;Patient/family education;Passive range of motion    PT Next Visit Plan  Follow up on pt's visit to Dr. LRonnie Derby Assess patients response to ankle mobilization and conitnue it or  mechanical traction for lumbar spine and continue if postive. Continue hip strengthening exercises as tolerated. Continue decompression exercises if tolerated well.    PT Home Exercise Plan  Eval: standing extension; 08/31/18- bridge, clamshell    Consulted and Agree with Plan of Care  Patient       Patient will benefit from skilled therapeutic intervention in order to improve the following deficits and impairments:  Abnormal gait, Pain, Decreased mobility, Postural dysfunction, Decreased activity tolerance, Difficulty walking, Decreased strength, Impaired tone, Impaired flexibility, Decreased balance  Visit Diagnosis: 1. Chronic left-sided low back pain with left-sided sciatica   2. Abnormal posture   3. Muscle weakness (generalized)        Problem List Patient Active Problem List   Diagnosis Date Noted  . Constipation in female 06/22/2017  . Fall 05/07/2017  . Right rib fracture 05/07/2017  . AKI (acute kidney injury) ([place] 05/04/2017  . Hyponatremia 05/03/2017  . Bipolar disorder with severe depression ([place] 11/23/2016  . Hypertension 11/23/2016  . Rheumatoid arthritis involving both hands with positive rheumatoid factor ([place] 06/01/2016  . Partial small bowel obstruction ([place] 03/12/2015  . Depression 03/12/2015  . Hypotension 03/12/2015  . Lactic acidosis 03/12/2015  . Dehydration 03/12/2015  . Small bowel obstruction ([place] 03/12/2015  . Hypothyroidism 02/21/2014  . 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  Major depressive disorder, recurrent episode with anxious distress ([place]) 02/13/2014  . Tachycardia 08/24/2013  . S/P arthroscopy of shoulder 08/14/2013  . Muscle weakness (generalized) 06/14/2013  . Rotator cuff syndrome of right shoulder 12/05/2012  . Right shoulder pain 12/05/2012  . Rectal bleeding 06/06/2011  . Epigastric pain 04/19/2011  . GERD (gastroesophageal reflux disease) 04/19/2011  . IBS (irritable bowel syndrome) 04/19/2011  . METATARSALGIA 05/12/2009  .  BUNION 05/12/2009    Kipp Brood, PT, DPT, [facility] Physical Therapist with [facility]  09/05/2018 5:33 PM    [place] [address] [place], [place], [address] Phone: [phone]   Fax:  [phone]  Name: Erica [name] MRN: [id number] Date of Birth: 11/09/1932

## 2018-09-06 DIAGNOSIS — M79605 Pain in left leg: Secondary | ICD-10-CM | POA: Diagnosis not present

## 2018-09-06 DIAGNOSIS — M5416 Radiculopathy, lumbar region: Secondary | ICD-10-CM | POA: Diagnosis not present

## 2018-09-10 ENCOUNTER — Ambulatory Visit (HOSPITAL_COMMUNITY): Payer: Medicare Other

## 2018-09-10 ENCOUNTER — Other Ambulatory Visit: Payer: Self-pay

## 2018-09-10 ENCOUNTER — Encounter (HOSPITAL_COMMUNITY): Payer: Self-pay

## 2018-09-10 DIAGNOSIS — M6281 Muscle weakness (generalized): Secondary | ICD-10-CM | POA: Diagnosis not present

## 2018-09-10 DIAGNOSIS — G8929 Other chronic pain: Secondary | ICD-10-CM | POA: Diagnosis not present

## 2018-09-10 DIAGNOSIS — M5442 Lumbago with sciatica, left side: Secondary | ICD-10-CM | POA: Diagnosis not present

## 2018-09-10 DIAGNOSIS — R293 Abnormal posture: Secondary | ICD-10-CM | POA: Diagnosis not present

## 2018-09-10 NOTE — Therapy (Signed)
[place], [place], [address] Phone: [phone]   Fax:  [phone]  Physical Therapy Treatment  Patient Details  Name: Erica [name] MRN: [id number] Date of Birth: 12/05/1932 Referring Provider (PT): Sherron Ales, MD   Encounter Date: 09/10/2018  PT End of Session - 09/10/18 1637    Visit Number  6    Number of Visits  12    Date for PT Re-Evaluation  10/01/18    Authorization Type  BCBS Medicare ($40 co-pay, no auth, no limit)    Authorization Time Period  08/20/2018-7/20-2020    Authorization - Visit Number  6    Authorization - Number of Visits  10    PT Start Time  1620    PT Stop Time  1704    PT Time Calculation (min)  44 min    Equipment Utilized During Treatment  Gait belt    Activity Tolerance  Patient tolerated treatment well    Behavior During Therapy  [facility] for tasks assessed/performed       Past Medical History:  Diagnosis Date  . Anxiety   . Bipolar disorder ([place])   . Hyperlipidemia   . Hypothyroidism   . Irritable bowel syndrome   . RA (rheumatoid arthritis) ([place])     Past Surgical History:  Procedure Laterality Date  . ABDOMINAL HYSTERECTOMY    . Bone Spurs  08/07/13   Right Shoulder  . COLONOSCOPY    . COLONOSCOPY N/A 05/08/2012   Procedure: COLONOSCOPY;  Surgeon: Rogene Houston, MD;  [facility];  Service: Endoscopy;  Laterality: N/A;  730  . COLOSTOMY    . ESOPHAGOGASTRODUODENOSCOPY N/A 11/04/2013   Procedure: ESOPHAGOGASTRODUODENOSCOPY (EGD);  Surgeon: Rogene Houston, MD;  [facility];  Service: Endoscopy;  Laterality: N/A;  730  . EYE SURGERY  cataract x 2  . MALONEY DILATION N/A 11/04/2013   Procedure: Venia Minks DILATION;  Surgeon: Rogene Houston, MD;  [facility];  Service: Endoscopy;  Laterality: N/A;  . UPPER GASTROINTESTINAL ENDOSCOPY      There were no vitals filed for this visit.  Subjective Assessment - 09/10/18 1646     Subjective  Patient reports she went to Dr. Ronnie Derby and got an injection in teh Lt knee which helped for about 3 days. She reports on Wednesday after leaving therapy she had trouble walking due to increased Lt loewr leg pain and feels it is from the ankle mobilization. She still feels to get proper treatment she needs an MRI to determine the source of her pain.    Pertinent History  Rheumatoid arthritis with Dupuytren's disease    Limitations  Sitting;Walking;Lifting;Standing;House hold activities    How long can you sit comfortably?  a few minutes (need to have feet and back supported)    How long can you stand comfortably?  ~ 3 minutes    How long can you walk comfortably?  ~ 3 minutes    Patient Stated Goals  to be able to get out of home again and go grocery shopping    Currently in Pain?  Yes    Pain Score  4     Pain Location  Leg    Pain Orientation  Left;Anterior;Lower    Pain Descriptors / Indicators  Aching    Pain Type  Chronic pain    Pain Onset  More than a month ago    Pain Frequency  Constant  [facility] Adult PT Treatment/Exercise - 09/10/18 0001      Lumbar Exercises: Stretches   Single Knee to Chest Stretch  Right;Left;5 reps;10 seconds    Prone on Elbows Stretch  10 seconds    Prone on Elbows Stretch Limitations  10 reps    Press Ups  10 reps;10 seconds    Other Lumbar Stretch Exercise  Childs Pose: 5 reps, 10 second holds      Lumbar Exercises: Supine   Ab Set  10 reps;Limitations    AB Set Limitations  post tilt with TrA for 10 sec holds    Bridge  10 reps;3 seconds    Bridge Limitations  2 sets      Lumbar Exercises: Prone   Straight Leg Raise  10 reps;3 seconds    Straight Leg Raises Limitations  bil LE        PT Education - 09/10/18 1643    Education Details  Educated on plan to to continue with therpay and target lower back exercises as patietn gets some relief from this.    Person(s) Educated  Patient    Methods  Explanation    Comprehension   Verbalized understanding;Returned demonstration       PT Short Term Goals - 08/20/18 1045      PT SHORT TERM GOAL #1   Title  Patient will be independent with HEP, updated PRN, to improve posture and reduce distal Lt LE symptoms.    Time  2    Period  Weeks    Status  New    Target Date  09/03/18      PT SHORT TERM GOAL #2   Title  Patient will perform all AROM for lumbar spine with no increase in distal symptoms of Lt LE to indicate improved mobility tolerance.    Time  3    Period  Weeks    Status  New    Target Date  09/10/18      PT SHORT TERM GOAL #3   Title  Patient will report being able to stand for ~10 minutes before requiring s seated rest break to alleviate Lt leg pain to increase ability to perform household activities.     Time  3    Period  Weeks    Status  New        PT Long Term Goals - 08/20/18 1047      PT LONG TERM GOAL #1   Title  Patient will report being able to stand/alk for 20 minutes at a time to be able to complete tasks around home such as prepare a meal, laundry, clean without an increase in Lt Leg pain.     Time  6    Period  Weeks    Status  New    Target Date  10/01/18      PT LONG TERM GOAL #2   Title  Patient will make significant improvement in LE strength to increase functional strength for improved activity tolerance.     Time  6    Period  Weeks    Status  New      PT LONG TERM GOAL #3   Title  Patient will report being able to go to the grocery store and ambulate with push cart for 20-30 minutes with no increase in Lt LE pain.    Time  6    Period  Weeks    Status  New      PT LONG TERM GOAL #  4   Title  Patient will verbalize benefit fo aerobic exercise on pain management in inflammation reduction to manage independently with exercises.    Time  6    Period  Weeks    Status  New         Plan - 09/10/18 1635    Clinical Impression Statement  Start of session discussed patient's recent visit to Dr. Ronnie Derby in which she  received an injection in her Lt knee. She is still unsure of source of Lt lower leg pain as it felt better for ~ 3 days but then started to bother her again. She still feels that she needs an MRI to direct her care and is waiting for her MD to order this, however she want's to give therapy a shot since she is doing it already. Patient did have poor response to ankle exercises and mobilization and this session returned to focus on hip strengthening and lumbar stretching. She continues to report relief with glut max activation and HEP was updated with prone SLR. She will continue to benefit from skilled PT interventions to address ogoing impairments.    Personal Factors and Comorbidities  Age;Other    Examination-Activity Limitations  Lift;Squat;Stand;Transfers;Continence;Locomotion Level    Examination-Participation Restrictions  Cleaning;Laundry;Community Activity;Driving    Clinical Decision Making  Moderate    Rehab Potential  Fair    PT Frequency  2x / week    PT Duration  6 weeks    PT Treatment/Interventions  ADLs/Self Care Home Management;Aquatic Therapy;Cryotherapy;Electrical Stimulation;Iontophoresis 90m/ml Dexamethasone;Moist Heat;Traction;Functional mobility training;Therapeutic activities;Therapeutic exercise;Balance training;Neuromuscular re-education;Manual techniques;Patient/family education;Passive range of motion    PT Next Visit Plan  Continue mechanical traction for lumbar spine and continue if postive. Continue hip strengthening exercises as tolerated. Continue decompression exercises if tolerated well.    PT Home Exercise Plan  Eval: standing extension; 08/31/18- bridge, clamshell; 09/10/18 - childs pose, prone slr, supine march with TrA    Consulted and Agree with Plan of Care  Patient       Patient will benefit from skilled therapeutic intervention in order to improve the following deficits and impairments:  Abnormal gait, Pain, Decreased mobility, Postural dysfunction, Decreased  activity tolerance, Difficulty walking, Decreased strength, Impaired tone, Impaired flexibility, Decreased balance  Visit Diagnosis: 1. Chronic left-sided low back pain with left-sided sciatica   2. Abnormal posture   3. Muscle weakness (generalized)        Problem List Patient Active Problem List   Diagnosis Date Noted  . Constipation in female 06/22/2017  . Fall 05/07/2017  . Right rib fracture 05/07/2017  . AKI (acute kidney injury) ([place] 05/04/2017  . Hyponatremia 05/03/2017  . Bipolar disorder with severe depression ([place] 11/23/2016  . Hypertension 11/23/2016  . Rheumatoid arthritis involving both hands with positive rheumatoid factor ([place] 06/01/2016  . Partial small bowel obstruction ([place] 03/12/2015  . Depression 03/12/2015  . Hypotension 03/12/2015  . Lactic acidosis 03/12/2015  . Dehydration 03/12/2015  . Small bowel obstruction ([place] 03/12/2015  . Hypothyroidism 02/21/2014  . 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 Major depressive disorder, recurrent episode with anxious distress ([place] 02/13/2014  . Tachycardia 08/24/2013  . S/P arthroscopy of shoulder 08/14/2013  . Muscle weakness (generalized) 06/14/2013  . Rotator cuff syndrome of right shoulder 12/05/2012  . Right shoulder pain 12/05/2012  . Rectal bleeding 06/06/2011  . Epigastric pain 04/19/2011  . GERD (gastroesophageal reflux disease) 04/19/2011  . IBS (irritable bowel syndrome) 04/19/2011  . METATARSALGIA 05/12/2009  . BUNION 05/12/2009  Kipp Brood, PT, DPT, [facility] Physical Therapist with [facility]  09/10/2018 5:04 PM    [place] [address] [place], [place], [address] Phone: [phone]   Fax:  [phone]  Name: AUNDRIA BITTERMAN MRN: [id number] Date of Birth: [birth year]

## 2018-09-10 NOTE — Patient Instructions (Signed)
Supine March with Posterior Pelvic Tilt reps: 10 sets: 3 daily: 1 weekly: 7   Exercise image step 1   Exercise image step 2   Exercise image step 3  Setup  Begin lying on your back with your knees bent and feet resting on the floor. Movement  Engage your abdominals and slowly raise one of your legs off the floor, keeping your knee bent. Hold briefly, then lower it back to the starting position and repeat with your other leg. Tip  Make sure to keep your core engaged and do not let your low back arch during the exercise. Prone Hip Extension reps: 10 sets: 1 hold: 5 seconds daily: 1  weekly: 7      Exercise image step 1   Exercise image step 2  Setup  Begin by lying on your stomach with both legs stretched straight behind you.  Movement  Slowly lift one leg upward as far as you can without arching your low back, then lower it back to the starting position.  Tip  Make sure to keep your knee straight and trunk steady during the exercise. Disclaimer: This program provides exercises related to your condition that you can perform at home. As there is a risk of injury with any activity, use caution when performing exercises. If you experience any pain or discomfort, discontinue the exercises and contact your health care provider.  Login URL: [email or web address] . Access Code: [email or web address] . Date printed: 09/10/2018 Page 1  Child's Pose Stretch reps: 10 sets: 1 hold: 10 seconds daily: 1  weekly: 7      Exercise image step 1   Exercise image step 2  Setup  Begin on all fours. Movement  Sit your hips back while reaching your arms overhead and lowering your chest to the ground. Hold this position. Tip  Make sure to relax into the pose and try to sit your bottom back to your heels as much as possible.

## 2018-09-13 ENCOUNTER — Other Ambulatory Visit: Payer: Self-pay

## 2018-09-13 ENCOUNTER — Ambulatory Visit (HOSPITAL_COMMUNITY)
Admission: RE | Admit: 2018-09-13 | Discharge: 2018-09-13 | Disposition: A | Discharge status: Home / Self Care | Payer: Medicare Other | Source: Ambulatory Visit | Attending: Internal Medicine | Admitting: Internal Medicine

## 2018-09-13 ENCOUNTER — Ambulatory Visit (HOSPITAL_COMMUNITY): Payer: Medicare Other

## 2018-09-13 ENCOUNTER — Telehealth (HOSPITAL_COMMUNITY): Payer: Self-pay | Admitting: Internal Medicine

## 2018-09-13 DIAGNOSIS — M8589 Other specified disorders of bone density and structure, multiple sites: Secondary | ICD-10-CM | POA: Diagnosis not present

## 2018-09-13 DIAGNOSIS — Z780 Asymptomatic menopausal state: Secondary | ICD-10-CM | POA: Insufficient documentation

## 2018-09-13 NOTE — Telephone Encounter (Signed)
09/13/18  pt left a message to cx - said that she has another appt at the hospital

## 2018-09-17 ENCOUNTER — Ambulatory Visit (HOSPITAL_COMMUNITY): Payer: Medicare Other

## 2018-09-17 ENCOUNTER — Telehealth (HOSPITAL_COMMUNITY): Payer: Self-pay | Admitting: Internal Medicine

## 2018-09-17 NOTE — Telephone Encounter (Signed)
09/17/18  I spoke with patient to cx today, 7/6 and she said she come in Friday because she has an appt with Ortho. Dr.  I did offer a 54 but she said she couldn't come then... I added to wait lis

## 2018-09-18 ENCOUNTER — Telehealth (HOSPITAL_COMMUNITY): Payer: Self-pay | Admitting: Internal Medicine

## 2018-09-18 NOTE — Telephone Encounter (Signed)
09/18/18  I called to offer patient 2:15 with Myriam Jacobson and she asked who she would have and I told her.  She said that she really needed to stay with Apolonio Schneiders but thanked me for calling.

## 2018-09-19 DIAGNOSIS — G5601 Carpal tunnel syndrome, right upper limb: Secondary | ICD-10-CM | POA: Diagnosis not present

## 2018-09-19 DIAGNOSIS — G5621 Lesion of ulnar nerve, right upper limb: Secondary | ICD-10-CM | POA: Diagnosis not present

## 2018-09-19 DIAGNOSIS — R200 Anesthesia of skin: Secondary | ICD-10-CM | POA: Diagnosis not present

## 2018-09-19 DIAGNOSIS — G5612 Other lesions of median nerve, left upper limb: Secondary | ICD-10-CM | POA: Diagnosis not present

## 2018-09-21 ENCOUNTER — Encounter (HOSPITAL_COMMUNITY): Payer: Medicare Other

## 2018-09-21 DIAGNOSIS — M4722 Other spondylosis with radiculopathy, cervical region: Secondary | ICD-10-CM | POA: Diagnosis not present

## 2018-09-21 DIAGNOSIS — M5416 Radiculopathy, lumbar region: Secondary | ICD-10-CM | POA: Diagnosis not present

## 2018-09-21 DIAGNOSIS — G8929 Other chronic pain: Secondary | ICD-10-CM | POA: Diagnosis not present

## 2018-09-21 DIAGNOSIS — M25562 Pain in left knee: Secondary | ICD-10-CM | POA: Diagnosis not present

## 2018-09-24 ENCOUNTER — Telehealth (HOSPITAL_COMMUNITY): Payer: Self-pay | Admitting: Internal Medicine

## 2018-09-24 ENCOUNTER — Ambulatory Visit (HOSPITAL_COMMUNITY): Payer: Medicare Other

## 2018-09-24 DIAGNOSIS — M5117 Intervertebral disc disorders with radiculopathy, lumbosacral region: Secondary | ICD-10-CM | POA: Diagnosis not present

## 2018-09-24 DIAGNOSIS — M48061 Spinal stenosis, lumbar region without neurogenic claudication: Secondary | ICD-10-CM | POA: Diagnosis not present

## 2018-09-24 DIAGNOSIS — M5416 Radiculopathy, lumbar region: Secondary | ICD-10-CM | POA: Diagnosis not present

## 2018-09-24 DIAGNOSIS — M79605 Pain in left leg: Secondary | ICD-10-CM | POA: Diagnosis not present

## 2018-09-24 DIAGNOSIS — M2578 Osteophyte, vertebrae: Secondary | ICD-10-CM | POA: Diagnosis not present

## 2018-09-24 NOTE — Telephone Encounter (Signed)
09/24/18  has another appt and thought this appt was cancelled last week

## 2018-09-26 ENCOUNTER — Ambulatory Visit (HOSPITAL_COMMUNITY): Payer: Medicare Other

## 2018-09-26 ENCOUNTER — Telehealth (HOSPITAL_COMMUNITY): Payer: Self-pay | Admitting: Internal Medicine

## 2018-09-26 NOTE — Telephone Encounter (Signed)
09/26/18  pt left a message to say that she went to the dr yesterday and he advised her to cx all remaining PT appts until they can figure out what is wrong with her.  she said that she may be back but for now cancel everything

## 2018-09-27 DIAGNOSIS — M1712 Unilateral primary osteoarthritis, left knee: Secondary | ICD-10-CM | POA: Diagnosis not present

## 2018-10-11 DIAGNOSIS — M79605 Pain in left leg: Secondary | ICD-10-CM | POA: Diagnosis not present

## 2018-10-11 DIAGNOSIS — M810 Age-related osteoporosis without current pathological fracture: Secondary | ICD-10-CM | POA: Diagnosis not present

## 2018-10-18 ENCOUNTER — Encounter (HOSPITAL_COMMUNITY)
Admission: RE | Admit: 2018-10-18 | Discharge: 2018-10-18 | Disposition: A | Discharge status: Home / Self Care | Payer: Medicare Other | Source: Ambulatory Visit | Attending: Internal Medicine | Admitting: Internal Medicine

## 2018-10-18 ENCOUNTER — Other Ambulatory Visit: Payer: Self-pay

## 2018-10-18 ENCOUNTER — Encounter (HOSPITAL_COMMUNITY): Payer: Self-pay

## 2018-10-18 DIAGNOSIS — M810 Age-related osteoporosis without current pathological fracture: Secondary | ICD-10-CM | POA: Insufficient documentation

## 2018-10-18 MED ORDER — DENOSUMAB 60 MG/ML ~~LOC~~ SOSY
60.0000 mg | PREFILLED_SYRINGE | Freq: Once | SUBCUTANEOUS | Status: AC
  Administered 2018-10-18: 60 mg via SUBCUTANEOUS

## 2018-10-18 NOTE — Discharge Instructions (Signed)
Denosumab injection °What is this medicine? °DENOSUMAB (den oh sue mab) slows bone breakdown. Prolia is used to treat osteoporosis in women after menopause and in men, and in people who are taking corticosteroids for 6 months or more. Xgeva is used to treat a high calcium level due to cancer and to prevent bone fractures and other bone problems caused by multiple myeloma or cancer bone metastases. Xgeva is also used to treat giant cell tumor of the bone. °This medicine may be used for other purposes; ask your health care provider or pharmacist if you have questions. °COMMON BRAND NAME(S): Prolia, XGEVA °What should I tell my health care provider before I take this medicine? °They need to know if you have any of these conditions: °· dental disease °· having surgery or tooth extraction °· infection °· kidney disease °· low levels of calcium or Vitamin D in the blood °· malnutrition °· on hemodialysis °· skin conditions or sensitivity °· thyroid or parathyroid disease °· an unusual reaction to denosumab, other medicines, foods, dyes, or preservatives °· pregnant or trying to get pregnant °· breast-feeding °How should I use this medicine? °This medicine is for injection under the skin. It is given by a health care professional in a hospital or clinic setting. °A special MedGuide will be given to you before each treatment. Be sure to read this information carefully each time. °For Prolia, talk to your pediatrician regarding the use of this medicine in children. Special care may be needed. For Xgeva, talk to your pediatrician regarding the use of this medicine in children. While this drug may be prescribed for children as young as 13 years for selected conditions, precautions do apply. °Overdosage: If you think you have taken too much of this medicine contact a poison control center or emergency room at once. °NOTE: This medicine is only for you. Do not share this medicine with others. °What if I miss a dose? °It is  important not to miss your dose. Call your doctor or health care professional if you are unable to keep an appointment. °What may interact with this medicine? °Do not take this medicine with any of the following medications: °· other medicines containing denosumab °This medicine may also interact with the following medications: °· medicines that lower your chance of fighting infection °· steroid medicines like prednisone or cortisone °This list may not describe all possible interactions. Give your health care provider a list of all the medicines, herbs, non-prescription drugs, or dietary supplements you use. Also tell them if you smoke, drink alcohol, or use illegal drugs. Some items may interact with your medicine. °What should I watch for while using this medicine? °Visit your doctor or health care professional for regular checks on your progress. Your doctor or health care professional may order blood tests and other tests to see how you are doing. °Call your doctor or health care professional for advice if you get a fever, chills or sore throat, or other symptoms of a cold or flu. Do not treat yourself. This drug may decrease your body's ability to fight infection. Try to avoid being around people who are sick. °You should make sure you get enough calcium and vitamin D while you are taking this medicine, unless your doctor tells you not to. Discuss the foods you eat and the vitamins you take with your health care professional. °See your dentist regularly. Brush and floss your teeth as directed. Before you have any dental work done, tell your dentist you are   receiving this medicine. Do not become pregnant while taking this medicine or for 5 months after stopping it. Talk with your doctor or health care professional about your birth control options while taking this medicine. Women should inform their doctor if they wish to become pregnant or think they might be pregnant. There is a potential for serious side  effects to an unborn child. Talk to your health care professional or pharmacist for more information. What side effects may I notice from receiving this medicine? Side effects that you should report to your doctor or health care professional as soon as possible:  allergic reactions like skin rash, itching or hives, swelling of the face, lips, or tongue  bone pain  breathing problems  dizziness  jaw pain, especially after dental work  redness, blistering, peeling of the skin  signs and symptoms of infection like fever or chills; cough; sore throat; pain or trouble passing urine  signs of low calcium like fast heartbeat, muscle cramps or muscle pain; pain, tingling, numbness in the hands or feet; seizures  unusual bleeding or bruising  unusually weak or tired Side effects that usually do not require medical attention (report to your doctor or health care professional if they continue or are bothersome):  constipation  diarrhea  headache  joint pain  loss of appetite  muscle pain  runny nose  tiredness  upset stomach This list may not describe all possible side effects. Call your doctor for medical advice about side effects. You may report side effects to FDA at 1-800-FDA-1088. Where should I keep my medicine? This medicine is only given in a clinic, doctor's office, or other health care setting and will not be stored at home. NOTE: This sheet is a summary. It may not cover all possible information. If you have questions about this medicine, talk to your doctor, pharmacist, or health care provider.  2020 Elsevier/Gold Standard (2017-07-07 16:10:44)

## 2018-10-24 DIAGNOSIS — M5416 Radiculopathy, lumbar region: Secondary | ICD-10-CM | POA: Diagnosis not present

## 2018-11-06 ENCOUNTER — Telehealth: Payer: Self-pay | Admitting: Adult Health

## 2018-11-06 NOTE — Telephone Encounter (Signed)
Pt states that she is having a vaginal itch and is wanting to speak with a nurse regarding.

## 2018-11-06 NOTE — Telephone Encounter (Signed)
Call Ms Erica [name] am having [place] make up a cream for her with zinc oxide nystatin and hydrocortisone for her pad vulvitis  She can pick it up around 6 pm tonight

## 2018-11-06 NOTE — Telephone Encounter (Signed)
Pt reports that she is having itching in her vaginal area. No discharge at all. She does wear pads for urinary leaking. Dr. Willey Blade gave her miconazole cream but she is not having itching inside so it didn't help. She wanted to know if there was anything else she can do?

## 2018-11-28 DIAGNOSIS — M4722 Other spondylosis with radiculopathy, cervical region: Secondary | ICD-10-CM | POA: Diagnosis not present

## 2018-11-28 DIAGNOSIS — M5416 Radiculopathy, lumbar region: Secondary | ICD-10-CM | POA: Diagnosis not present

## 2018-11-28 DIAGNOSIS — M4716 Other spondylosis with myelopathy, lumbar region: Secondary | ICD-10-CM | POA: Diagnosis not present

## 2018-12-05 DIAGNOSIS — M0589 Other rheumatoid arthritis with rheumatoid factor of multiple sites: Secondary | ICD-10-CM | POA: Diagnosis not present

## 2018-12-05 DIAGNOSIS — Z79899 Other long term (current) drug therapy: Secondary | ICD-10-CM | POA: Diagnosis not present

## 2018-12-05 DIAGNOSIS — M720 Palmar fascial fibromatosis [Dupuytren]: Secondary | ICD-10-CM | POA: Diagnosis not present

## 2018-12-05 DIAGNOSIS — M65341 Trigger finger, right ring finger: Secondary | ICD-10-CM | POA: Diagnosis not present

## 2018-12-31 ENCOUNTER — Ambulatory Visit (INDEPENDENT_AMBULATORY_CARE_PROVIDER_SITE_OTHER): Payer: Medicare Other | Admitting: Adult Health

## 2018-12-31 ENCOUNTER — Other Ambulatory Visit: Payer: Self-pay

## 2018-12-31 ENCOUNTER — Encounter: Payer: Self-pay | Admitting: Adult Health

## 2018-12-31 VITALS — BP 128/72 | HR 108 | Ht 62.0 in | Wt 123.0 lb

## 2018-12-31 DIAGNOSIS — L02224 Furuncle of groin: Secondary | ICD-10-CM | POA: Diagnosis not present

## 2018-12-31 MED ORDER — CEPHALEXIN 500 MG PO CAPS: 500.0000 mg | ORAL_CAPSULE | Freq: Three times a day (TID) | ORAL | 0 refills | Status: DC

## 2018-12-31 MED ORDER — CEPHALEXIN 500 MG PO CAPS: 500.0000 mg | ORAL_CAPSULE | Freq: Four times a day (QID) | ORAL | 0 refills | Status: DC

## 2018-12-31 NOTE — Patient Instructions (Signed)

## 2018-12-31 NOTE — Progress Notes (Addendum)
  Subjective:     Patient ID: Erica [name], female   DOB: 1932-07-17, 85 y.o.   MRN: [id number]  HPI Erica [name] is a 85 year old white female, widowed, sp hysterectomy in worked in for ?boil in right groin, has noticed lump today but has seen spot of blood on panties. PCP is Dr Willey Blade.  Review of Systems +lump in groin  Reviewed past medical,surgical, social and family history. Reviewed medications and allergies.     Objective:   Physical Exam BP 128/72 (BP Location: Left Arm, Patient Position: Sitting, Cuff Size: Normal)   Pulse (!) 108   Ht 5\' 2"  (1.575 m)   Wt 123 lb (55.8 kg)   BMI 22.50 kg/m Skin warm and dry, has marble sized area in area groin,red, and draining serous fluid, culture obtained. Erica [name] her daughter in law is with her.      Assessment:     Boil     Plan:     Wound culture  Sent Will rx keflex, has taken before, has PCN allergy and other antibiotics bother her stomach Use warm compresses Get poise panties, to keep pad from rubbing Follow up in 2 weeks Review handout on boils

## 2019-01-02 LAB — WOUND CULTURE

## 2019-01-04 ENCOUNTER — Ambulatory Visit: Payer: Medicare Other | Admitting: Obstetrics and Gynecology

## 2019-01-11 DIAGNOSIS — Z23 Encounter for immunization: Secondary | ICD-10-CM | POA: Diagnosis not present

## 2019-01-14 ENCOUNTER — Ambulatory Visit: Payer: Medicare Other | Admitting: Adult Health

## 2019-01-18 ENCOUNTER — Other Ambulatory Visit: Payer: Self-pay | Admitting: *Deleted

## 2019-01-18 DIAGNOSIS — Z20822 Contact with and (suspected) exposure to covid-19: Secondary | ICD-10-CM

## 2019-01-19 LAB — NOVEL CORONAVIRUS, NAA: SARS-CoV-2, NAA: NOT DETECTED

## 2019-01-21 ENCOUNTER — Telehealth: Payer: Self-pay | Admitting: Internal Medicine

## 2019-01-21 NOTE — Telephone Encounter (Signed)
Patient called in and received her covid test result  °

## 2019-01-23 DIAGNOSIS — M4722 Other spondylosis with radiculopathy, cervical region: Secondary | ICD-10-CM | POA: Diagnosis not present

## 2019-01-23 DIAGNOSIS — M5416 Radiculopathy, lumbar region: Secondary | ICD-10-CM | POA: Diagnosis not present

## 2019-01-28 ENCOUNTER — Ambulatory Visit (INDEPENDENT_AMBULATORY_CARE_PROVIDER_SITE_OTHER): Payer: Medicare Other | Admitting: Internal Medicine

## 2019-01-28 ENCOUNTER — Other Ambulatory Visit: Payer: Self-pay

## 2019-01-28 ENCOUNTER — Encounter (INDEPENDENT_AMBULATORY_CARE_PROVIDER_SITE_OTHER): Payer: Self-pay | Admitting: Internal Medicine

## 2019-01-28 VITALS — BP 168/93 | HR 122 | Temp 97.0°F | Ht 62.0 in | Wt 122.0 lb

## 2019-01-28 DIAGNOSIS — R140 Abdominal distension (gaseous): Secondary | ICD-10-CM

## 2019-01-28 DIAGNOSIS — K219 Gastro-esophageal reflux disease without esophagitis: Secondary | ICD-10-CM

## 2019-01-28 MED ORDER — PANTOPRAZOLE SODIUM 40 MG PO TBEC: 40.0000 mg | DELAYED_RELEASE_TABLET | Freq: Every day | ORAL | 1 refills | Status: DC

## 2019-01-28 NOTE — Patient Instructions (Signed)
Phazyme 185 mg 3 times a day or after every meal for 1 week and thereafter on as-needed basis. Take pantoprazole 40 mg by mouth 30 minutes before breakfast daily. Patient to call with progress report in 2 weeks.

## 2019-01-28 NOTE — Progress Notes (Signed)
Virtual Visit via Telephone Note  Patient had office visit scheduled for today.  Patient's caregiver tested positive for Covid.  Patient's Covid test was negative but she is still under quarantine and to have another test in 2 days. Therefore patient decided to have virtual visit and I agreed. I connected with Erica [name] on 01/28/19 at 12:00 PM EST by telephone and verified that I am speaking with the correct person using two identifiers.  Location: Patient: home Provider: ofice   I discussed the limitations, risks, security and privacy concerns of performing an evaluation and management service by telephone and the availability of in person appointments. I also discussed with the patient that there may be a patient responsible charge related to this service. The patient expressed understanding and agreed to proceed.   History of Present Illness:  Patient is 85 year old Caucasian female who has chronic GERD history of IBS whose last visit was on 07/31/2018 at which time she was doing well.  Patient now complains of bloating after each meal.  She does not have nausea or diarrhea.  She is on pantoprazole every other day and wonders if dose reduction has made her bloating worse.  She does not have heartburn often.  She also denies vomiting.  She says she is doing much better as far as bowels are concerned.  She generally has 1 bowel movement daily.  Occasionally may skip a day.  She used to have diarrhea with urgency.  She is watching her diet closely.  She takes ibuprofen no more than once or twice a week.  She has not lost any weight recently.  No history of recent antibiotic use.   Meds reviewed with the patient and noted to be accurate.  Observations/Objective:  Patient reported her weight to be 122 pounds.  Assessment and Plan:  #1 chronic GERD.  She had been on pantoprazole 40 mg daily but dose was reduced on monitor for prior visits.  She is not having heartburn often but she is  having bloating which may or may not be related to GERD.  She certainly could have aerophagia resulting in bloating.  Will change PPI to daily and see how she does.  #2.  Bloating.  Differential diagnoses include aerophagia or small intestinal bacterial overgrowth.  If he does not respond to symptomatic therapy would consider course of antibiotic therapy.  #3.  IBS.  She is doing well with as needed polyethylene glycol.  Follow Up Instructions:  Take pantoprazole 40 mg by mouth 30 minutes before breakfast daily.  New prescription sent to patient's pharmacy. Phazyme 185 mg by mouth after each meal for 1 to 2 weeks and thereafter on as-needed basis if it helps. Patient will call with progress report in 2 weeks. Office visit in 6 months. I discussed the assessment and treatment plan with the patient. The patient was provided an opportunity to ask questions and all were answered. The patient agreed with the plan and demonstrated an understanding of the instructions.   The patient was advised to call back or seek an in-person evaluation if the symptoms worsen or if the condition fails to improve as anticipated.  I provided 8 minutes of non-face-to-face time during this encounter.   Hildred Laser, MD

## 2019-01-30 ENCOUNTER — Other Ambulatory Visit: Payer: Self-pay

## 2019-01-30 DIAGNOSIS — Z20822 Contact with and (suspected) exposure to covid-19: Secondary | ICD-10-CM

## 2019-02-01 LAB — NOVEL CORONAVIRUS, NAA: SARS-CoV-2, NAA: NOT DETECTED

## 2019-02-05 ENCOUNTER — Ambulatory Visit (INDEPENDENT_AMBULATORY_CARE_PROVIDER_SITE_OTHER): Payer: Medicare Other | Admitting: Internal Medicine

## 2019-02-15 DIAGNOSIS — M5416 Radiculopathy, lumbar region: Secondary | ICD-10-CM | POA: Diagnosis not present

## 2019-02-21 DIAGNOSIS — N3000 Acute cystitis without hematuria: Secondary | ICD-10-CM | POA: Diagnosis not present

## 2019-02-22 ENCOUNTER — Ambulatory Visit: Payer: Medicare Other | Admitting: Adult Health

## 2019-03-06 DIAGNOSIS — M65341 Trigger finger, right ring finger: Secondary | ICD-10-CM | POA: Diagnosis not present

## 2019-03-06 DIAGNOSIS — Z79899 Other long term (current) drug therapy: Secondary | ICD-10-CM | POA: Diagnosis not present

## 2019-03-06 DIAGNOSIS — M0589 Other rheumatoid arthritis with rheumatoid factor of multiple sites: Secondary | ICD-10-CM | POA: Diagnosis not present

## 2019-03-06 DIAGNOSIS — M720 Palmar fascial fibromatosis [Dupuytren]: Secondary | ICD-10-CM | POA: Diagnosis not present

## 2019-03-19 DIAGNOSIS — Z23 Encounter for immunization: Secondary | ICD-10-CM | POA: Diagnosis not present

## 2019-04-04 DIAGNOSIS — M5416 Radiculopathy, lumbar region: Secondary | ICD-10-CM | POA: Diagnosis not present

## 2019-04-17 ENCOUNTER — Ambulatory Visit: Payer: Medicare Other | Attending: Internal Medicine

## 2019-04-17 ENCOUNTER — Other Ambulatory Visit: Payer: Self-pay

## 2019-04-17 DIAGNOSIS — Z20822 Contact with and (suspected) exposure to covid-19: Secondary | ICD-10-CM

## 2019-04-18 ENCOUNTER — Telehealth: Payer: Self-pay | Admitting: *Deleted

## 2019-04-18 LAB — NOVEL CORONAVIRUS, NAA: SARS-CoV-2, NAA: NOT DETECTED

## 2019-04-18 NOTE — Telephone Encounter (Signed)
Pt calling for covid results, pending, not resulted as of yet. Pt verbalizes understanding. 

## 2019-04-22 ENCOUNTER — Encounter (HOSPITAL_COMMUNITY)
Admission: RE | Admit: 2019-04-22 | Discharge: 2019-04-22 | Disposition: A | Discharge status: Home / Self Care | Payer: Medicare Other | Source: Ambulatory Visit | Attending: Internal Medicine | Admitting: Internal Medicine

## 2019-06-03 DIAGNOSIS — E785 Hyperlipidemia, unspecified: Secondary | ICD-10-CM | POA: Diagnosis not present

## 2019-06-03 DIAGNOSIS — Z79899 Other long term (current) drug therapy: Secondary | ICD-10-CM | POA: Diagnosis not present

## 2019-06-03 DIAGNOSIS — F319 Bipolar disorder, unspecified: Secondary | ICD-10-CM | POA: Diagnosis not present

## 2019-06-10 DIAGNOSIS — E785 Hyperlipidemia, unspecified: Secondary | ICD-10-CM | POA: Diagnosis not present

## 2019-06-10 DIAGNOSIS — F310 Bipolar disorder, current episode hypomanic: Secondary | ICD-10-CM | POA: Diagnosis not present

## 2019-06-10 DIAGNOSIS — I10 Essential (primary) hypertension: Secondary | ICD-10-CM | POA: Diagnosis not present

## 2019-06-18 DIAGNOSIS — I10 Essential (primary) hypertension: Secondary | ICD-10-CM | POA: Diagnosis not present

## 2019-06-18 DIAGNOSIS — E039 Hypothyroidism, unspecified: Secondary | ICD-10-CM | POA: Diagnosis not present

## 2019-06-18 DIAGNOSIS — K5904 Chronic idiopathic constipation: Secondary | ICD-10-CM | POA: Diagnosis not present

## 2019-06-18 DIAGNOSIS — F3341 Major depressive disorder, recurrent, in partial remission: Secondary | ICD-10-CM | POA: Diagnosis not present

## 2019-06-25 DIAGNOSIS — D225 Melanocytic nevi of trunk: Secondary | ICD-10-CM | POA: Diagnosis not present

## 2019-06-25 DIAGNOSIS — L814 Other melanin hyperpigmentation: Secondary | ICD-10-CM | POA: Diagnosis not present

## 2019-06-25 DIAGNOSIS — L821 Other seborrheic keratosis: Secondary | ICD-10-CM | POA: Diagnosis not present

## 2019-06-25 DIAGNOSIS — Z85828 Personal history of other malignant neoplasm of skin: Secondary | ICD-10-CM | POA: Diagnosis not present

## 2019-06-26 ENCOUNTER — Ambulatory Visit (HOSPITAL_COMMUNITY): Payer: Medicare Other | Admitting: Physical Therapy

## 2019-07-02 ENCOUNTER — Encounter (HOSPITAL_COMMUNITY): Payer: Self-pay

## 2019-07-02 ENCOUNTER — Ambulatory Visit (HOSPITAL_COMMUNITY): Payer: Medicare Other

## 2019-07-02 NOTE — Therapy (Signed)
[facility] [address] [place], [place], [address] Phone: [phone]   Fax:  [phone]  Patient Details  Name: Erica [name] MRN: [id number] Date of Birth: [birth year] Referring Provider:  No ref. provider found  Encounter Date: 07/02/2019  Pt arrived and called front desk due to having high BP at home and requesting therapist check pt's BP prior to evaluation. Upon arrival to the car, pt sitting in passenger seat and reports she just hung up with PCP nurse and was educated on how to adjust BP medication moving forward. Pt reports feeling shaky and having "a little but not much" headache. Pt reports her BP at home was 169/22mmHg and she is on a depression patch (MAOI) which has a known side effect of elevating BP. Pt reports taking something to calm her nerves but still feels shaky all over. Pt's vitals were taken in sitting: BP 148/73mmHg, SpO2 98% on RA, and HR 132bpm. Pt reports wanting to reschedule evaluation due to elevated BP and HR. Therapist educated pt to call us back when she wants to reschedule and to call her PCP back if her symptoms worsen or persist and she verbalized understanding.    Domenick Bookbinder PT, DPT 07/02/19, 2:37 PM [phone]  [facility] [address] [place], [place], [address] Phone: [phone]   Fax:  [phone]

## 2019-07-05 DIAGNOSIS — I10 Essential (primary) hypertension: Secondary | ICD-10-CM | POA: Diagnosis not present

## 2019-07-05 DIAGNOSIS — R3989 Other symptoms and signs involving the genitourinary system: Secondary | ICD-10-CM | POA: Diagnosis not present

## 2019-07-05 DIAGNOSIS — N3000 Acute cystitis without hematuria: Secondary | ICD-10-CM | POA: Diagnosis not present

## 2019-07-08 ENCOUNTER — Ambulatory Visit (HOSPITAL_COMMUNITY): Payer: Medicare Other | Attending: Family Medicine | Admitting: Physical Therapy

## 2019-07-08 ENCOUNTER — Encounter (HOSPITAL_COMMUNITY): Payer: Self-pay

## 2019-07-09 ENCOUNTER — Telehealth (HOSPITAL_COMMUNITY): Payer: Self-pay | Admitting: Physical Therapy

## 2019-07-09 NOTE — Telephone Encounter (Signed)
Called pt to r/s she has a UTI and BP is up -patient forgot this apptment and requested to close referral. She will get another referral if needed later this year.

## 2019-07-19 DIAGNOSIS — E039 Hypothyroidism, unspecified: Secondary | ICD-10-CM | POA: Diagnosis not present

## 2019-07-19 DIAGNOSIS — Z87440 Personal history of urinary (tract) infections: Secondary | ICD-10-CM | POA: Diagnosis not present

## 2019-07-19 DIAGNOSIS — I10 Essential (primary) hypertension: Secondary | ICD-10-CM | POA: Diagnosis not present

## 2019-07-19 DIAGNOSIS — R2681 Unsteadiness on feet: Secondary | ICD-10-CM | POA: Diagnosis not present

## 2019-07-30 ENCOUNTER — Encounter (INDEPENDENT_AMBULATORY_CARE_PROVIDER_SITE_OTHER): Payer: Self-pay | Admitting: Internal Medicine

## 2019-07-30 ENCOUNTER — Other Ambulatory Visit: Payer: Self-pay

## 2019-07-30 ENCOUNTER — Ambulatory Visit (INDEPENDENT_AMBULATORY_CARE_PROVIDER_SITE_OTHER): Payer: Medicare Other | Admitting: Internal Medicine

## 2019-07-30 VITALS — BP 127/75 | HR 106 | Temp 97.4°F | Ht 62.0 in | Wt 119.1 lb

## 2019-07-30 DIAGNOSIS — K219 Gastro-esophageal reflux disease without esophagitis: Secondary | ICD-10-CM | POA: Diagnosis not present

## 2019-07-30 DIAGNOSIS — K589 Irritable bowel syndrome without diarrhea: Secondary | ICD-10-CM

## 2019-07-30 NOTE — Progress Notes (Signed)
Presenting complaint;  Follow-up for GERD and IBS.  Database and subjective:  Patient is 86 year old Caucasian female who has history of GERD, history of diverticular stricture status post sigmoid colon resection for colonic diverticular disease over 20 years ago as well as history of IBS who is here for scheduled visit.  She was last seen 6 months ago. She says she is doing well as far as GERD is concerned and she taking PPI every other day.  Every now and then she has to take a daily for few days.  She complains of bloating.  She is using Gas-X which helps some.  She has not experienced nausea or vomiting.  She reports occasional dysphagia to solids.  She has not had an episode of food impaction.  She states is not bad enough to do any intervention.  Her bowels move daily.  She used to have diarrhea with urgency but not anymore.  She says she was treated with antibiotics for 4 weeks for UTI.  She initially was treated with Bactrim for 2 weeks followed by Keflex for 2 weeks.  She says she did not get any diarrhea.  She has an appointment to see Dr. Mannie Stabile next week and she says she will have her urine checked again.  She also complains of urinary incontinence.  She is not having to take MiraLAX anymore. She reports having difficulty controlling her blood pressure.  She feels is related to MR or AI patch.  She says losartan dose had to be increased. She also complains of imbalance and planning to have physical therapy. Patient's family history is positive for colon carcinoma in her mother.  Her last colonoscopy was in February 2014.  She says she has not interested in any more colonoscopies unless she has symptoms. She says her appetite is good and she eats plenty of fruits every day.  She has lost 3 pounds since her last visit.  She says she has received both doses of Covid vaccine manufactured by [employer] and did not have any serious side effects.  Current Medications: Outpatient Encounter  Medications as of 07/30/2019  Medication Sig  . acetaminophen (TYLENOL) 650 MG CR tablet Take 1,300 mg by mouth daily.  Marland Kitchen ALPRAZolam (XANAX PO) Take by mouth. Patient states that she takes 1/3 tablet as needed for anxiety, per patient not often.  . Cyanocobalamin (VITAMIN B-12 PO) Take by mouth daily.  Marland Kitchen losartan (COZAAR) 50 MG tablet Take 50 mg by mouth daily.  . Multiple Vitamins-Minerals (CENTRUM SILVER 50+WOMEN) TABS Take 1 tablet by mouth.  . pantoprazole (PROTONIX) 40 MG tablet Take 1 tablet (40 mg total) by mouth daily before breakfast.  . polyethylene glycol powder (GLYCOLAX/MIRALAX) powder 17 gram po qd as directed.  . selegiline (EMSAM) 6 MG/24HR Place 1 patch (6 mg total) onto the skin daily. (Patient taking differently: Place 9 mg onto the skin daily. )  . SYNTHROID 75 MCG tablet Take 1 tablet (75 mcg total) by mouth daily.  Marland Kitchen VITAMIN D PO Take by mouth.  . [DISCONTINUED] denosumab (PROLIA) 60 MG/ML SOSY injection Inject 60 mg into the skin every 6 (six) months.  . [DISCONTINUED] ibuprofen (ADVIL) 200 MG tablet Take 1 tablet (200 mg total) by mouth 3 (three) times daily as needed. (Patient not taking: Reported on 07/30/2019)  . [DISCONTINUED] valsartan (DIOVAN) 80 MG tablet Take 40 mg by mouth at bedtime.    No facility-administered encounter medications on file as of 07/30/2019.     Objective: Blood pressure 127/75,  pulse (!) 106, temperature (!) 97.4 F (36.3 C), temperature source Temporal, height 5\' 2"  (1.575 m), weight 119 lb 1.6 oz (54 kg). Patient is alert and in no acute distress. She is wearing a facial mask. Conjunctiva is pink. Sclera is nonicteric Oropharyngeal mucosa is normal. No neck masses or thyromegaly noted. Cardiac exam with regular rhythm normal S1 and S2. No murmur or gallop noted. Lungs are clear to auscultation. Abdomen is symmetrical.  She has lower abdominal scar to the right of midline.  Bowel sounds are hyperactive.  On palpation abdomen is soft and  nontender with organomegaly or masses. She has deformity to both hands with swollen metacarpophalangeal joints ulnar deviation and Dupuytren's contraction.   Assessment:  #1.  Chronic GERD.  She has a history of erosive reflux esophagitis.  Last EGD was in August 2015.  She also underwent esophageal dilation with symptomatic relief of her dysphagia.  I suspect she may have underlying esophageal motility disorder.  If dysphagia becomes more frequent would consider further evaluation with EGD. She will continue to use PPI every other day except when she is traveling or if her eating schedule changes in which case she can take it daily for few days or week or so.  #2.  Irritable bowel syndrome.  She had diarrhea and urgency for several years.  And then she flipped to constipation.  Lately she has been having regular bowel movements.  She is having flatulence for which she is taking simethicone.  She does not have any alarm symptoms.  Since flatulence improved with antibiotics I wonder if she has small intestinal bacterial overgrowth.  Will monitor the symptom for now.  #3.  Patient is high risk for CRC because of family history of of colon carcinoma in her mother.  Last colonoscopy was about 7 years ago.  No more colonoscopies given her age.   Plan:  Continue pantoprazole 40 mg daily or every other day. Use polyethylene glycol on as-needed basis. Patient would like to return for office visit in 6 months.

## 2019-07-30 NOTE — Patient Instructions (Signed)
Can use Gas-X or Phazyme on as-needed basis for bloating/flatulence. Please call office if flatulence becomes intractable.

## 2019-08-01 DIAGNOSIS — N390 Urinary tract infection, site not specified: Secondary | ICD-10-CM | POA: Diagnosis not present

## 2019-08-07 ENCOUNTER — Ambulatory Visit (HOSPITAL_COMMUNITY): Payer: Medicare Other | Attending: Family Medicine | Admitting: Physical Therapy

## 2019-08-07 ENCOUNTER — Encounter (HOSPITAL_COMMUNITY): Payer: Self-pay | Admitting: Physical Therapy

## 2019-08-07 ENCOUNTER — Other Ambulatory Visit: Payer: Self-pay

## 2019-08-07 DIAGNOSIS — M6281 Muscle weakness (generalized): Secondary | ICD-10-CM

## 2019-08-07 DIAGNOSIS — R2681 Unsteadiness on feet: Secondary | ICD-10-CM

## 2019-08-07 NOTE — Therapy (Signed)
[facility] [address] [place], [place], [address] Phone: [phone]   Fax:  [phone]  Physical Therapy Evaluation  Patient Details  Name: Erica [name] MRN: [id number] Date of Birth: 07/03/1932 Referring Provider (PT): Aliene Beams MD    Encounter Date: 08/07/2019  PT End of Session - 08/07/19 1706    Visit Number  1    Number of Visits  8    Date for PT Re-Evaluation  09/06/19    Authorization Type  BCBS Medicare (No auth, no V.L.)    Authorization - Visit Number  1    Authorization - Number of Visits  8    Progress Note Due on Visit  8    PT Start Time  1115    PT Stop Time  1200    PT Time Calculation (min)  45 min    Activity Tolerance  Patient tolerated treatment well    Behavior During Therapy  [facility] for tasks assessed/performed       Past Medical History:  Diagnosis Date  . Anxiety   . Bipolar disorder (HCC)   . Hyperlipidemia   . Hypothyroidism   . Irritable bowel syndrome   . RA (rheumatoid arthritis) (HCC)     Past Surgical History:  Procedure Laterality Date  . ABDOMINAL HYSTERECTOMY    . Bone Spurs  08/07/13   Right Shoulder  . COLONOSCOPY    . COLONOSCOPY N/A 05/08/2012   Procedure: COLONOSCOPY;  Surgeon: Malissa Hippo, MD;  [facility];  Service: Endoscopy;  Laterality: N/A;  730  . COLOSTOMY    . ESOPHAGOGASTRODUODENOSCOPY N/A 11/04/2013   Procedure: ESOPHAGOGASTRODUODENOSCOPY (EGD);  Surgeon: Malissa Hippo, MD;  [facility];  Service: Endoscopy;  Laterality: N/A;  730  . EYE SURGERY  cataract x 2  . MALONEY DILATION N/A 11/04/2013   Procedure: Elease Hashimoto DILATION;  Surgeon: Malissa Hippo, MD;  [facility];  Service: Endoscopy;  Laterality: N/A;  . UPPER GASTROINTESTINAL ENDOSCOPY      There were no vitals filed for this visit.   Subjective Assessment - 08/07/19 1124    Subjective  Patient presents to physical therapy with complaint of balance issue. She  says this has been bothering her for about a year now. She says she has been using a cane for stability.  Patient describes history of depression and taking mediation for this, in addition to recently starting blood pressure medication. She states she thinks these things have had an effect on her brain which she says may have impacted her balance, but she is not sure. She says she spoke with her PCP about this, and was recommended to try physical therapy for balance exercise. She also notes history of low back issues, but says she does not think this is contributing to her issues.    Limitations  Walking;Standing;House hold activities    How long can you stand comfortably?  3-5 minutes with cane    How long can you walk comfortably?  5 minutes with cane    Patient Stated Goals  Improve my balance    Currently in Pain?  No/denies         [facility] PT Assessment - 08/07/19 0001      Assessment   Medical Diagnosis  Gait instability     Referring Provider (PT)  Aliene Beams MD     Onset Date/Surgical Date  --   about 1 year ago  Prior Therapy  Yes for low back, not for balance       Precautions   Precautions  Fall      Restrictions   Weight Bearing Restrictions  No      Balance Screen   Has the patient fallen in the past 6 months  No    Has the patient had a decrease in activity level because of a fear of falling?   Yes    Is the patient reluctant to leave their home because of a fear of falling?   No      Home Social worker  Private residence    Living Arrangements  Alone    Available Help at Discharge  Personal care attendant      Prior Function   Level of Independence  Independent with basic ADLs   has assistant help with daily tasks (cooking, cleaning etc)   Vocation  Retired      Associate Professor   Overall Cognitive Status  Within Functional Limits for tasks assessed      Strength   Right Hip Flexion  4/5    Right Hip Extension  4/5    Right Hip ABduction   4/5    Left Hip Flexion  4/5    Left Hip Extension  4+/5    Left Hip ABduction  4/5    Right Knee Flexion  4+/5    Right Knee Extension  5/5    Left Knee Flexion  4+/5    Left Knee Extension  5/5    Right Ankle Dorsiflexion  5/5    Left Ankle Dorsiflexion  5/5      Transfers   Five time sit to stand comments   11 sec with minimal use of UEs       Ambulation/Gait   Ambulation/Gait  Yes    Ambulation/Gait Assistance  6: Modified independent (Device/Increase time)    Assistive device  Straight cane    Gait Pattern  Decreased step length - right;Decreased step length - left;Decreased stride length;Wide base of support    Ambulation Surface  Level;Indoor      Standardized Balance Assessment   Standardized Balance Assessment  Berg Balance Test      Berg Balance Test   Sit to Stand  Able to stand without using hands and stabilize independently    Standing Unsupported  Able to stand safely 2 minutes    Sitting with Back Unsupported but Feet Supported on Floor or Stool  Able to sit safely and securely 2 minutes    Stand to Sit  Sits safely with minimal use of hands    Transfers  Able to transfer safely, minor use of hands    Standing Unsupported with Eyes Closed  Able to stand 10 seconds with supervision    Standing Unsupported with Feet Together  Able to place feet together independently and stand for 1 minute with supervision    From Standing, Reach Forward with Outstretched Arm  Can reach forward >12 cm safely (5")    From Standing Position, Pick up Object from Floor  Able to pick up shoe safely and easily    From Standing Position, Turn to Look Behind Over each Shoulder  Looks behind from both sides and weight shifts well    Turn 360 Degrees  Able to turn 360 degrees safely in 4 seconds or less    Standing Unsupported, Alternately Place Feet on Step/Stool  Able to stand independently and safely and complete  8 steps in 20 seconds    Standing Unsupported, One Foot in Front  Able to take  small step independently and hold 30 seconds    Standing on One Leg  Able to lift leg independently and hold equal to or more than 3 seconds    Total Score  49                  Objective measurements completed on examination: See above findings.              PT Education - 08/07/19 1126    Education Details  On evaluation findings, and POC    Person(s) Educated  Patient    Methods  Explanation    Comprehension  Verbalized understanding       PT Short Term Goals - 08/07/19 1712      PT SHORT TERM GOAL #1   Title  Patient will be independent with initial HEP and self-management strategies to improve functional outcomes    Time  2    Period  Weeks    Status  New    Target Date  08/23/19        PT Long Term Goals - 08/07/19 1713      PT LONG TERM GOAL #1   Title  Patient will have equal to or > 4+/5 MMT throughout BLE to improve ability to perform functional mobility, stair ambulation and ADLs.    Time  4    Period  Weeks    Status  New    Target Date  09/06/19      PT LONG TERM GOAL #2   Title  Patient will report at least 75% overall improvement in subjective complaint to indicate improvement in ability to perform ADLs.    Time  4    Period  Weeks    Status  New    Target Date  09/06/19      PT LONG TERM GOAL #3   Title  Patient will improve BERG score to 52/56 to indicate improved balance, functional mobility and reduced risk for falls.    Time  4    Period  Weeks    Status  New    Target Date  09/06/19             Plan - 08/07/19 1708    Clinical Impression Statement  Patient is a 86 y.o. female who presents to physical therapy with complaint of decreased balance and general weakness. Patient demonstrates decreased strength, balance deficits and gait abnormalities which are negatively impacting patient ability to perform ADLs and functional mobility tasks. Patient will benefit from skilled physical therapy services to address these  deficits to improve level of function with ADLs, functional mobility tasks, and reduce risk for falls.    Personal Factors and Comorbidities  Age    Examination-Activity Limitations  Lift;Squat;Stand;Transfers;Locomotion Level;Dressing;Stairs    Examination-Participation Restrictions  Cleaning;Laundry;Community Activity;Yard Work    Stability/Clinical Decision Making  Stable/Uncomplicated    Optometrist  Low    Rehab Potential  Good    PT Frequency  2x / week    PT Duration  4 weeks    PT Treatment/Interventions  ADLs/Self Care Home Management;Aquatic Therapy;Cryotherapy;Electrical Stimulation;Iontophoresis 4mg /ml Dexamethasone;Moist Heat;Traction;Functional mobility training;Therapeutic activities;Therapeutic exercise;Balance training;Neuromuscular re-education;Manual techniques;Patient/family education;Passive range of motion;Biofeedback;Contrast Bath;Fluidtherapy;Parrafin;Ultrasound;Vestibular;Visual/perceptual remediation/compensation;Gait training;Stair training;Dry needling;Energy conservation;Spinal Manipulations;Joint Manipulations;Manual lymph drainage;Splinting;Vasopneumatic Device;Compression bandaging;Orthotic Fit/Training;Taping;DME Instruction    PT Next Visit Plan  Review goals and issue HEP. HEP to consist of  sit/ stand, heel raise, tandem standing at counter. Progress LE strength and balance with focus on functional activity as able.    PT Home Exercise Plan  Issue at next visit    Consulted and Agree with Plan of Care  Patient       Patient will benefit from skilled therapeutic intervention in order to improve the following deficits and impairments:  Abnormal gait, Decreased mobility, Decreased activity tolerance, Difficulty walking, Decreased strength, Decreased balance, Decreased endurance  Visit Diagnosis: Unsteadiness on feet  Muscle weakness (generalized)     Problem List Patient Active Problem List   Diagnosis Date Noted  . Bloating 01/28/2019  .  Boil of groin 12/31/2018  . Constipation in female 06/22/2017  . Fall 05/07/2017  . Right rib fracture 05/07/2017  . AKI (acute kidney injury) (HCC) 05/04/2017  . Hyponatremia 05/03/2017  . Bipolar disorder with severe depression (HCC) 11/23/2016  . Hypertension 11/23/2016  . Rheumatoid arthritis involving both hands with positive rheumatoid factor (HCC) 06/01/2016  . Partial small bowel obstruction (HCC) 03/12/2015  . Depression 03/12/2015  . Hypotension 03/12/2015  . Lactic acidosis 03/12/2015  . Dehydration 03/12/2015  . Small bowel obstruction (HCC) 03/12/2015  . Hypothyroidism 02/21/2014  . 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 Major depressive disorder, recurrent episode with anxious distress (HCC) 02/13/2014  . Tachycardia 08/24/2013  . S/P arthroscopy of shoulder 08/14/2013  . Muscle weakness (generalized) 06/14/2013  . Rotator cuff syndrome of right shoulder 12/05/2012  . Right shoulder pain 12/05/2012  . Rectal bleeding 06/06/2011  . Epigastric pain 04/19/2011  . GERD (gastroesophageal reflux disease) 04/19/2011  . IBS (irritable bowel syndrome) 04/19/2011  . METATARSALGIA 05/12/2009  . BUNION 05/12/2009   5:18 PM, 08/07/19 Georges Lynch PT DPT  Physical Therapist with [place]  [facility]  [phone]   [facility] [address] [place], [place], [address] Phone: [phone]   Fax:  [phone]  Name: Erica [name] MRN: [id number] Date of Birth: 02/19/1933

## 2019-08-19 DIAGNOSIS — E039 Hypothyroidism, unspecified: Secondary | ICD-10-CM | POA: Diagnosis not present

## 2019-08-19 DIAGNOSIS — I10 Essential (primary) hypertension: Secondary | ICD-10-CM | POA: Diagnosis not present

## 2019-08-19 DIAGNOSIS — R1900 Intra-abdominal and pelvic swelling, mass and lump, unspecified site: Secondary | ICD-10-CM | POA: Diagnosis not present

## 2019-08-19 DIAGNOSIS — Z87440 Personal history of urinary (tract) infections: Secondary | ICD-10-CM | POA: Diagnosis not present

## 2019-08-20 ENCOUNTER — Other Ambulatory Visit: Payer: Self-pay

## 2019-08-20 ENCOUNTER — Ambulatory Visit (HOSPITAL_COMMUNITY): Payer: Medicare Other | Attending: Family Medicine

## 2019-08-20 ENCOUNTER — Encounter (HOSPITAL_COMMUNITY): Payer: Self-pay

## 2019-08-20 DIAGNOSIS — R293 Abnormal posture: Secondary | ICD-10-CM | POA: Insufficient documentation

## 2019-08-20 DIAGNOSIS — M5442 Lumbago with sciatica, left side: Secondary | ICD-10-CM | POA: Diagnosis not present

## 2019-08-20 DIAGNOSIS — R2681 Unsteadiness on feet: Secondary | ICD-10-CM | POA: Insufficient documentation

## 2019-08-20 DIAGNOSIS — G8929 Other chronic pain: Secondary | ICD-10-CM | POA: Insufficient documentation

## 2019-08-20 DIAGNOSIS — M6281 Muscle weakness (generalized): Secondary | ICD-10-CM | POA: Diagnosis not present

## 2019-08-20 NOTE — Patient Instructions (Signed)
Functional Quadriceps: Sit to Stand    Sit on edge of chair, feet flat on floor. Stand upright, extending knees fully. Repeat 10 times per set. Do 2 sets per day.  http://orth.exer.us/735   Copyright  VHI. All rights reserved.   Heel Raise (Calf Strength / Balance)    Stand with support. Breathe in. Rise up on tiptoes, breathing out through pursed lips. Hold position to count of 3 seconds.  Return slowly, breathing in. Repeat 10 times per session. Do 2 sessions per day.  Tandem Stance    Stand by counter for safety.   Right foot in front of left, heel touching toe both feet "straight ahead". Stand on Foot Triangle of Support with both feet.  Balance in this position 30 seconds.  Do with left foot in front of right.  Copyright  VHI. All rights reserved.

## 2019-08-20 NOTE — Therapy (Signed)
[facility] [address] [place], [place], [address] Phone: [phone]   Fax:  [phone]  Physical Therapy Treatment  Patient Details  Name: Erica [name] MRN: [id number] Date of Birth: [birth year] Referring Provider (PT): Aliene Beams MD    Encounter Date: 08/20/2019  PT End of Session - 08/20/19 1414    Visit Number  2    Number of Visits  8    Date for PT Re-Evaluation  09/06/19    Authorization Type  BCBS Medicare (No auth, no V.L.)    Authorization - Visit Number  2    Authorization - Number of Visits  8    Progress Note Due on Visit  8    PT Start Time  1410    PT Stop Time  1448    PT Time Calculation (min)  38 min    Activity Tolerance  Patient tolerated treatment well    Behavior During Therapy  [facility] for tasks assessed/performed       Past Medical History:  Diagnosis Date  . Anxiety   . Bipolar disorder (HCC)   . Hyperlipidemia   . Hypothyroidism   . Irritable bowel syndrome   . RA (rheumatoid arthritis) (HCC)     Past Surgical History:  Procedure Laterality Date  . ABDOMINAL HYSTERECTOMY    . Bone Spurs  08/07/13   Right Shoulder  . COLONOSCOPY    . COLONOSCOPY N/A 05/08/2012   Procedure: COLONOSCOPY;  Surgeon: Malissa Hippo, MD;  [facility];  Service: Endoscopy;  Laterality: N/A;  730  . COLOSTOMY    . ESOPHAGOGASTRODUODENOSCOPY N/A 11/04/2013   Procedure: ESOPHAGOGASTRODUODENOSCOPY (EGD);  Surgeon: Malissa Hippo, MD;  [facility];  Service: Endoscopy;  Laterality: N/A;  730  . EYE SURGERY  cataract x 2  . MALONEY DILATION N/A 11/04/2013   Procedure: Elease Hashimoto DILATION;  Surgeon: Malissa Hippo, MD;  [facility];  Service: Endoscopy;  Laterality: N/A;  . UPPER GASTROINTESTINAL ENDOSCOPY      There were no vitals filed for this visit.  Subjective Assessment - 08/20/19 1411    Subjective  Pt arrived a few minutes late due to dog out of house.  No reports recent  falls, stated she walks with [facility] regularly.    Currently in Pain?  No/denies                        [facility] Adult PT Treatment/Exercise - 08/20/19 0001      Exercises   Exercises  Knee/Hip      Knee/Hip Exercises: Standing   Heel Raises  10 reps    Heel Raises Limitations  established HEP    Hip Abduction  Both;10 reps;Knee straight    Other Standing Knee Exercises  tandem stance 2x 30" on foam 2 sets with intermittent HHA    Other Standing Knee Exercises  sidestep 2RT      Knee/Hip Exercises: Seated   Sit to Sand  10 reps;without UE support   eccentric control            PT Education - 08/20/19 1424    Education Details  Reviewed goals, educated importance of HEP compliace for maximal benefits, established HEP this session.    Person(s) Educated  Patient    Methods  Explanation;Demonstration;Handout    Comprehension  Verbalized understanding;Returned demonstration       PT Short Term Goals - 08/07/19 1712  PT SHORT TERM GOAL #1   Title  Patient will be independent with initial HEP and self-management strategies to improve functional outcomes    Time  2    Period  Weeks    Status  New    Target Date  08/23/19        PT Long Term Goals - 08/07/19 1713      PT LONG TERM GOAL #1   Title  Patient will have equal to or > 4+/5 MMT throughout BLE to improve ability to perform functional mobility, stair ambulation and ADLs.    Time  4    Period  Weeks    Status  New    Target Date  09/06/19      PT LONG TERM GOAL #2   Title  Patient will report at least 75% overall improvement in subjective complaint to indicate improvement in ability to perform ADLs.    Time  4    Period  Weeks    Status  New    Target Date  09/06/19      PT LONG TERM GOAL #3   Title  Patient will improve BERG score to 52/56 to indicate improved balance, functional mobility and reduced risk for falls.    Time  4    Period  Weeks    Status  New    Target Date  09/06/19             Plan - 08/20/19 1433    Clinical Impression Statement  Reviewed goals, educated importance of HEP compliance for maximal benefits that was established this session.  Session focus with LE strengthening and balance training, main cueing for posture awareness and min A with dynamic surfaces for safety.  Pt does have history of LBP, would benefit with core strengthening to assist with balance and pain control.    Personal Factors and Comorbidities  Age    Examination-Activity Limitations  Lift;Squat;Stand;Transfers;Locomotion Level;Dressing;Stairs    Examination-Participation Restrictions  Cleaning;Laundry;Community Activity;Yard Work    Stability/Clinical Decision Making  Stable/Uncomplicated    Designer, jewellery  Low    Rehab Potential  Good    PT Frequency  2x / week    PT Duration  4 weeks    PT Treatment/Interventions  ADLs/Self Care Home Management;Aquatic Therapy;Cryotherapy;Electrical Stimulation;Iontophoresis 4mg /ml Dexamethasone;Moist Heat;Traction;Functional mobility training;Therapeutic activities;Therapeutic exercise;Balance training;Neuromuscular re-education;Manual techniques;Patient/family education;Passive range of motion;Biofeedback;Contrast Bath;Fluidtherapy;Parrafin;Ultrasound;Vestibular;Visual/perceptual remediation/compensation;Gait training;Stair training;Dry needling;Energy conservation;Spinal Manipulations;Joint Manipulations;Manual lymph drainage;Splinting;Vasopneumatic Device;Compression bandaging;Orthotic Fit/Training;Taping;DME Instruction    PT Next Visit Plan  Review compliance with HEP. Progress LE strength and balance with focus on functional activity as able.    PT Home Exercise Plan  08/20/19: STS, tandem stance and heel raises.       Patient will benefit from skilled therapeutic intervention in order to improve the following deficits and impairments:  Abnormal gait, Decreased mobility, Decreased activity tolerance, Difficulty walking, Decreased  strength, Decreased balance, Decreased endurance  Visit Diagnosis: Unsteadiness on feet  Muscle weakness (generalized)     Problem List Patient Active Problem List   Diagnosis Date Noted  . Bloating 01/28/2019  . Boil of groin 12/31/2018  . Constipation in female 06/22/2017  . Fall 05/07/2017  . Right rib fracture 05/07/2017  . AKI (acute kidney injury) ([place]) 05/04/2017  . Hyponatremia 05/03/2017  . Bipolar disorder with severe depression ([place]) 11/23/2016  . Hypertension 11/23/2016  . Rheumatoid arthritis involving both hands with positive rheumatoid factor ([place]) 06/01/2016  . Partial small bowel obstruction ([place]) 03/12/2015  .  Depression 03/12/2015  . Hypotension 03/12/2015  . Lactic acidosis 03/12/2015  . Dehydration 03/12/2015  . Small bowel obstruction (HCC) 03/12/2015  . Hypothyroidism 02/21/2014  . 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 Major depressive disorder, recurrent episode with anxious distress (HCC) 02/13/2014  . Tachycardia 08/24/2013  . S/P arthroscopy of shoulder 08/14/2013  . Muscle weakness (generalized) 06/14/2013  . Rotator cuff syndrome of right shoulder 12/05/2012  . Right shoulder pain 12/05/2012  . Rectal bleeding 06/06/2011  . Epigastric pain 04/19/2011  . GERD (gastroesophageal reflux disease) 04/19/2011  . IBS (irritable bowel syndrome) 04/19/2011  . METATARSALGIA 05/12/2009  . BUNION 05/12/2009   Becky Sax, LPTA/CLT; CBIS [phone]  Juel Burrow 08/20/2019, 3:00 PM  [facility] [address] [place], [place], [address] Phone: [phone]   Fax:  [phone]  Name: Erica [name] MRN: [id number] Date of Birth: May 20, 1932

## 2019-08-22 ENCOUNTER — Encounter (HOSPITAL_COMMUNITY): Payer: Medicare Other | Admitting: Physical Therapy

## 2019-08-22 ENCOUNTER — Telehealth (HOSPITAL_COMMUNITY): Payer: Self-pay | Admitting: Physical Therapy

## 2019-08-22 NOTE — Telephone Encounter (Signed)
Patient (pt cancelled appts for 6/10 and 6/15. She had something come up today and on Tuesday she will be out of town)

## 2019-08-23 ENCOUNTER — Telehealth (HOSPITAL_COMMUNITY): Payer: Self-pay | Admitting: *Deleted

## 2019-08-23 NOTE — Telephone Encounter (Signed)
Called to schedule a follow up Prolia injection.  Patient states that she is no longer interested in receiving injections.

## 2019-08-27 ENCOUNTER — Encounter (HOSPITAL_COMMUNITY): Payer: Medicare Other | Admitting: Physical Therapy

## 2019-08-29 ENCOUNTER — Other Ambulatory Visit: Payer: Self-pay

## 2019-08-29 ENCOUNTER — Ambulatory Visit (HOSPITAL_COMMUNITY): Payer: Medicare Other | Admitting: Physical Therapy

## 2019-08-29 DIAGNOSIS — R293 Abnormal posture: Secondary | ICD-10-CM | POA: Diagnosis not present

## 2019-08-29 DIAGNOSIS — M6281 Muscle weakness (generalized): Secondary | ICD-10-CM | POA: Diagnosis not present

## 2019-08-29 DIAGNOSIS — R2681 Unsteadiness on feet: Secondary | ICD-10-CM | POA: Diagnosis not present

## 2019-08-29 DIAGNOSIS — G8929 Other chronic pain: Secondary | ICD-10-CM

## 2019-08-29 DIAGNOSIS — M5442 Lumbago with sciatica, left side: Secondary | ICD-10-CM | POA: Diagnosis not present

## 2019-08-29 NOTE — Therapy (Signed)
[facility] [address] [place], [place], [address] Phone: [phone]   Fax:  [phone]  Physical Therapy Treatment  Patient Details  Name: Erica [name] MRN: [id number] Date of Birth: 11/22/1932 Referring Provider (PT): Aliene Beams MD    Encounter Date: 08/29/2019   PT End of Session - 08/29/19 1546    Visit Number 3    Number of Visits 8    Date for PT Re-Evaluation 09/06/19    Authorization Type BCBS Medicare (No auth, no V.L.)    Authorization - Visit Number 3    Authorization - Number of Visits 8    Progress Note Due on Visit 8    PT Start Time 1408    PT Stop Time 1446    PT Time Calculation (min) 38 min    Activity Tolerance Patient tolerated treatment well    Behavior During Therapy [facility] for tasks assessed/performed           Past Medical History:  Diagnosis Date  . Anxiety   . Bipolar disorder (HCC)   . Hyperlipidemia   . Hypothyroidism   . Irritable bowel syndrome   . RA (rheumatoid arthritis) (HCC)     Past Surgical History:  Procedure Laterality Date  . ABDOMINAL HYSTERECTOMY    . Bone Spurs  08/07/13   Right Shoulder  . COLONOSCOPY    . COLONOSCOPY N/A 05/08/2012   Procedure: COLONOSCOPY;  Surgeon: Malissa Hippo, MD;  [facility];  Service: Endoscopy;  Laterality: N/A;  730  . COLOSTOMY    . ESOPHAGOGASTRODUODENOSCOPY N/A 11/04/2013   Procedure: ESOPHAGOGASTRODUODENOSCOPY (EGD);  Surgeon: Malissa Hippo, MD;  [facility];  Service: Endoscopy;  Laterality: N/A;  730  . EYE SURGERY  cataract x 2  . MALONEY DILATION N/A 11/04/2013   Procedure: Elease Hashimoto DILATION;  Surgeon: Malissa Hippo, MD;  [facility];  Service: Endoscopy;  Laterality: N/A;  . UPPER GASTROINTESTINAL ENDOSCOPY      There were no vitals filed for this visit.   Subjective Assessment - 08/29/19 1416    Subjective Pt states she just discovered a friend has cancer as she was coming to therapy so  she is a little upset.  States she is not doing as well on her exercises as she should.  STates her whole body hurts at 7/10.    Currently in Pain? Yes    Pain Score 7     Pain Location Generalized                             [facility] Adult PT Treatment/Exercise - 08/29/19 0001      Knee/Hip Exercises: Standing   Heel Raises 15 reps    Hip Abduction Both;10 reps;Knee straight    Hip Extension Both;10 reps;Knee straight      Knee/Hip Exercises: Seated   Sit to Sand 10 reps;without UE support      Knee/Hip Exercises: Supine   Other Supine Knee/Hip Exercises decompression 1-5 5 reps each                     PT Short Term Goals - 08/07/19 1712      PT SHORT TERM GOAL #1   Title Patient will be independent with initial HEP and self-management strategies to improve functional outcomes    Time 2    Period Weeks    Status New  Target Date 08/23/19             PT Long Term Goals - 08/07/19 1713      PT LONG TERM GOAL #1   Title Patient will have equal to or > 4+/5 MMT throughout BLE to improve ability to perform functional mobility, stair ambulation and ADLs.    Time 4    Period Weeks    Status New    Target Date 09/06/19      PT LONG TERM GOAL #2   Title Patient will report at least 75% overall improvement in subjective complaint to indicate improvement in ability to perform ADLs.    Time 4    Period Weeks    Status New    Target Date 09/06/19      PT LONG TERM GOAL #3   Title Patient will improve BERG score to 52/56 to indicate improved balance, functional mobility and reduced risk for falls.    Time 4    Period Weeks    Status New    Target Date 09/06/19                 Plan - 08/29/19 1547    Clinical Impression Statement Pt returned today after missing last week; admits non compliance with therex.  Continued focus on improving mobility, increasing strength and reducing pain.  Added standing hip strengthening and decompression  exercises.  Pt required cues for form and completing therex slow and controlled.  No pain verbalized while doing or at completion of therex.  Updated HEP to include decompression exercises as feel these may help with her lumbar pain.    Personal Factors and Comorbidities Age    Examination-Activity Limitations Lift;Squat;Stand;Transfers;Locomotion Level;Dressing;Stairs    Examination-Participation Restrictions Cleaning;Laundry;Community Activity;Yard Work    Stability/Clinical Decision Making Stable/Uncomplicated    Rehab Potential Good    PT Frequency 2x / week    PT Duration 4 weeks    PT Treatment/Interventions ADLs/Self Care Home Management;Aquatic Therapy;Cryotherapy;Electrical Stimulation;Iontophoresis 4mg /ml Dexamethasone;Moist Heat;Traction;Functional mobility training;Therapeutic activities;Therapeutic exercise;Balance training;Neuromuscular re-education;Manual techniques;Patient/family education;Passive range of motion;Biofeedback;Contrast Bath;Fluidtherapy;Parrafin;Ultrasound;Vestibular;Visual/perceptual remediation/compensation;Gait training;Stair training;Dry needling;Energy conservation;Spinal Manipulations;Joint Manipulations;Manual lymph drainage;Splinting;Vasopneumatic Device;Compression bandaging;Orthotic Fit/Training;Taping;DME Instruction    PT Next Visit Plan Review compliance with HEP. Progress LE strength and balance with focus on functional activity as able.    PT Home Exercise Plan 08/20/19: STS, tandem stance and heel raises.           Patient will benefit from skilled therapeutic intervention in order to improve the following deficits and impairments:  Abnormal gait, Decreased mobility, Decreased activity tolerance, Difficulty walking, Decreased strength, Decreased balance, Decreased endurance  Visit Diagnosis: Unsteadiness on feet  Muscle weakness (generalized)  Chronic left-sided low back pain with left-sided sciatica  Abnormal posture     Problem  List Patient Active Problem List   Diagnosis Date Noted  . Bloating 01/28/2019  . Boil of groin 12/31/2018  . Constipation in female 06/22/2017  . Fall 05/07/2017  . Right rib fracture 05/07/2017  . AKI (acute kidney injury) (HCC) 05/04/2017  . Hyponatremia 05/03/2017  . Bipolar disorder with severe depression (HCC) 11/23/2016  . Hypertension 11/23/2016  . Rheumatoid arthritis involving both hands with positive rheumatoid factor (HCC) 06/01/2016  . Partial small bowel obstruction (HCC) 03/12/2015  . Depression 03/12/2015  . Hypotension 03/12/2015  . Lactic acidosis 03/12/2015  . Dehydration 03/12/2015  . Small bowel obstruction (HCC) 03/12/2015  . Hypothyroidism 02/21/2014  . 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 Major depressive disorder, recurrent episode with  anxious distress ([place]) 02/13/2014  . Tachycardia 08/24/2013  . S/P arthroscopy of shoulder 08/14/2013  . Muscle weakness (generalized) 06/14/2013  . Rotator cuff syndrome of right shoulder 12/05/2012  . Right shoulder pain 12/05/2012  . Rectal bleeding 06/06/2011  . Epigastric pain 04/19/2011  . GERD (gastroesophageal reflux disease) 04/19/2011  . IBS (irritable bowel syndrome) 04/19/2011  . METATARSALGIA 05/12/2009  . BUNION 05/12/2009   Teena Irani, PTA/CLT [phone]  Teena Irani 08/29/2019, 3:48 PM  [facility] [address] [place], [place], [address] Phone: [phone]   Fax:  [phone]  Name: Erica [name] MRN: [id number] Date of Birth: 1932-09-29

## 2019-09-02 ENCOUNTER — Encounter (HOSPITAL_COMMUNITY): Payer: Self-pay | Admitting: Physical Therapy

## 2019-09-02 ENCOUNTER — Other Ambulatory Visit: Payer: Self-pay

## 2019-09-02 ENCOUNTER — Ambulatory Visit (HOSPITAL_COMMUNITY): Payer: Medicare Other | Admitting: Physical Therapy

## 2019-09-02 DIAGNOSIS — G8929 Other chronic pain: Secondary | ICD-10-CM

## 2019-09-02 DIAGNOSIS — R293 Abnormal posture: Secondary | ICD-10-CM | POA: Diagnosis not present

## 2019-09-02 DIAGNOSIS — R2681 Unsteadiness on feet: Secondary | ICD-10-CM

## 2019-09-02 DIAGNOSIS — M6281 Muscle weakness (generalized): Secondary | ICD-10-CM | POA: Diagnosis not present

## 2019-09-02 DIAGNOSIS — M5442 Lumbago with sciatica, left side: Secondary | ICD-10-CM | POA: Diagnosis not present

## 2019-09-02 NOTE — Therapy (Signed)
[facility] [place], [place], [address] Phone: [phone]   Fax:  [phone]  Physical Therapy Treatment  Patient Details  Name: Erica [name] MRN: [id number] Date of Birth: 1933/01/05 Referring Provider (PT): [name] MD    Encounter Date: 09/02/2019   PT End of Session - 09/02/19 1124    Visit Number 4    Number of Visits 8    Date for PT Re-Evaluation 09/06/19    Authorization Type BCBS Medicare (No auth, no V.L.)    Authorization - Visit Number 4    Authorization - Number of Visits 8    Progress Note Due on Visit 8    PT Start Time 1120    PT Stop Time 1200    PT Time Calculation (min) 40 min    Activity Tolerance Patient tolerated treatment well    Behavior During Therapy [facility] for tasks assessed/performed           Past Medical History:  Diagnosis Date  . Anxiety   . Bipolar disorder ([place])   . Hyperlipidemia   . Hypothyroidism   . Irritable bowel syndrome   . RA (rheumatoid arthritis) ([place])     Past Surgical History:  Procedure Laterality Date  . ABDOMINAL HYSTERECTOMY    . Bone Spurs  08/07/13   Right Shoulder  . COLONOSCOPY    . COLONOSCOPY N/A 05/08/2012   Procedure: COLONOSCOPY;  Surgeon: Rogene Houston, MD;  [facility];  Service: Endoscopy;  Laterality: N/A;  730  . COLOSTOMY    . ESOPHAGOGASTRODUODENOSCOPY N/A 11/04/2013   Procedure: ESOPHAGOGASTRODUODENOSCOPY (EGD);  Surgeon: Rogene Houston, MD;  [facility];  Service: Endoscopy;  Laterality: N/A;  730  . EYE SURGERY  cataract x 2  . MALONEY DILATION N/A 11/04/2013   Procedure: Venia Minks DILATION;  Surgeon: Rogene Houston, MD;  [facility];  Service: Endoscopy;  Laterality: N/A;  . UPPER GASTROINTESTINAL ENDOSCOPY      There were no vitals filed for this visit.   Subjective Assessment - 09/02/19 1121    Subjective Patient says she has not been working on HEP too much due to being busy with her  daughter in town. Says she feels being more active with caring for her dog has been helpful. Says she is not really having pain today, took an advil today.    Currently in Pain? No/denies                             [facility] Adult PT Treatment/Exercise - 09/02/19 0001      Knee/Hip Exercises: Standing   Heel Raises 15 reps    Hip Abduction Both;10 reps;Knee straight    Hip Extension Both;10 reps;Knee straight    Lateral Step Up Both;1 set;10 reps;Hand Hold: 1;Step Height: 4"    Forward Step Up Both;1 set;10 reps;Hand Hold: 1;Step Height: 4"    Gait Training 200 feet with SPC     Other Standing Knee Exercises sidestepping 15' 2RT no AD       Knee/Hip Exercises: Seated   Sit to Sand 10 reps;without UE support               Balance Exercises - 09/02/19 0001      Balance Exercises: Standing   Standing Eyes Opened Narrow base of support (BOS);Foam/compliant surface;2 reps;30 secs    Standing Eyes Closed Narrow base of support (  BOS);Foam/compliant surface;2 reps;30 secs    Tandem Stance 2 reps;30 secs               PT Short Term Goals - 08/07/19 1712      PT SHORT TERM GOAL #1   Title Patient will be independent with initial HEP and self-management strategies to improve functional outcomes    Time 2    Period Weeks    Status New    Target Date 08/23/19             PT Long Term Goals - 08/07/19 1713      PT LONG TERM GOAL #1   Title Patient will have equal to or > 4+/5 MMT throughout BLE to improve ability to perform functional mobility, stair ambulation and ADLs.    Time 4    Period Weeks    Status New    Target Date 09/06/19      PT LONG TERM GOAL #2   Title Patient will report at least 75% overall improvement in subjective complaint to indicate improvement in ability to perform ADLs.    Time 4    Period Weeks    Status New    Target Date 09/06/19      PT LONG TERM GOAL #3   Title Patient will improve BERG score to 52/56 to indicate  improved balance, functional mobility and reduced risk for falls.    Time 4    Period Weeks    Status New    Target Date 09/06/19                 Plan - 09/02/19 1311    Clinical Impression Statement Patient tolerated session well today overall. Patient shows improvement in static balance, and was able to progress to compliant surface. Patient with complaint of back and leg pain with prolonged standing today, and required several breaks for rest. Added forward and lateral step ups for strengthening progression. Patient will continue to benefit from skilled therapy services to progress core strength and balance to improve functional mobility and reduce risk for falls.    Personal Factors and Comorbidities Age    Examination-Activity Limitations Lift;Squat;Stand;Transfers;Locomotion Level;Dressing;Stairs    Examination-Participation Restrictions Cleaning;Laundry;Community Activity;Yard Work    Stability/Clinical Decision Making Stable/Uncomplicated    Rehab Potential Good    PT Frequency 2x / week    PT Duration 4 weeks    PT Treatment/Interventions ADLs/Self Care Home Management;Aquatic Therapy;Cryotherapy;Electrical Stimulation;Iontophoresis 4mg /ml Dexamethasone;Moist Heat;Traction;Functional mobility training;Therapeutic activities;Therapeutic exercise;Balance training;Neuromuscular re-education;Manual techniques;Patient/family education;Passive range of motion;Biofeedback;Contrast Bath;Fluidtherapy;Parrafin;Ultrasound;Vestibular;Visual/perceptual remediation/compensation;Gait training;Stair training;Dry needling;Energy conservation;Spinal Manipulations;Joint Manipulations;Manual lymph drainage;Splinting;Vasopneumatic Device;Compression bandaging;Orthotic Fit/Training;Taping;DME Instruction    PT Next Visit Plan Reassess next visit. Adjust POC as indicated    PT Home Exercise Plan 08/20/19: STS, tandem stance and heel raises.    Consulted and Agree with Plan of Care Patient            Patient will benefit from skilled therapeutic intervention in order to improve the following deficits and impairments:  Abnormal gait, Decreased mobility, Decreased activity tolerance, Difficulty walking, Decreased strength, Decreased balance, Decreased endurance  Visit Diagnosis: Unsteadiness on feet  Muscle weakness (generalized)  Chronic left-sided low back pain with left-sided sciatica  Abnormal posture     Problem List Patient Active Problem List   Diagnosis Date Noted  . Bloating 01/28/2019  . Boil of groin 12/31/2018  . Constipation in female 06/22/2017  . Fall 05/07/2017  . Right rib fracture 05/07/2017  . AKI (acute kidney injury) (  HCC) 05/04/2017  . Hyponatremia 05/03/2017  . Bipolar disorder with severe depression (HCC) 11/23/2016  . Hypertension 11/23/2016  . Rheumatoid arthritis involving both hands with positive rheumatoid factor (HCC) 06/01/2016  . Partial small bowel obstruction (HCC) 03/12/2015  . Depression 03/12/2015  . Hypotension 03/12/2015  . Lactic acidosis 03/12/2015  . Dehydration 03/12/2015  . Small bowel obstruction (HCC) 03/12/2015  . Hypothyroidism 02/21/2014  . 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 Major depressive disorder, recurrent episode with anxious distress (HCC) 02/13/2014  . Tachycardia 08/24/2013  . S/P arthroscopy of shoulder 08/14/2013  . Muscle weakness (generalized) 06/14/2013  . Rotator cuff syndrome of right shoulder 12/05/2012  . Right shoulder pain 12/05/2012  . Rectal bleeding 06/06/2011  . Epigastric pain 04/19/2011  . GERD (gastroesophageal reflux disease) 04/19/2011  . IBS (irritable bowel syndrome) 04/19/2011  . METATARSALGIA 05/12/2009  . BUNION 05/12/2009   1:25 PM, 09/02/19 Georges Lynch PT DPT  Physical Therapist with [place]  [facility]  [phone]   [facility] [address] [place], [place], [address] Phone: [phone]   Fax:   [phone]  Name: Erica [name] MRN: [id number] Date of Birth: 1932/06/15

## 2019-09-03 ENCOUNTER — Encounter (HOSPITAL_COMMUNITY): Payer: Medicare Other | Admitting: Physical Therapy

## 2019-09-03 DIAGNOSIS — Z79899 Other long term (current) drug therapy: Secondary | ICD-10-CM | POA: Diagnosis not present

## 2019-09-03 DIAGNOSIS — M720 Palmar fascial fibromatosis [Dupuytren]: Secondary | ICD-10-CM | POA: Diagnosis not present

## 2019-09-03 DIAGNOSIS — M0589 Other rheumatoid arthritis with rheumatoid factor of multiple sites: Secondary | ICD-10-CM | POA: Diagnosis not present

## 2019-09-03 DIAGNOSIS — M65341 Trigger finger, right ring finger: Secondary | ICD-10-CM | POA: Diagnosis not present

## 2019-09-04 ENCOUNTER — Ambulatory Visit (HOSPITAL_COMMUNITY): Payer: Medicare Other | Admitting: Physical Therapy

## 2019-09-05 ENCOUNTER — Encounter (HOSPITAL_COMMUNITY): Payer: Medicare Other | Admitting: Physical Therapy

## 2019-09-09 ENCOUNTER — Ambulatory Visit (HOSPITAL_COMMUNITY): Payer: Medicare Other | Admitting: Physical Therapy

## 2019-09-09 ENCOUNTER — Other Ambulatory Visit: Payer: Self-pay

## 2019-09-09 DIAGNOSIS — M6281 Muscle weakness (generalized): Secondary | ICD-10-CM

## 2019-09-09 DIAGNOSIS — R2681 Unsteadiness on feet: Secondary | ICD-10-CM | POA: Diagnosis not present

## 2019-09-09 DIAGNOSIS — G8929 Other chronic pain: Secondary | ICD-10-CM | POA: Diagnosis not present

## 2019-09-09 DIAGNOSIS — M5442 Lumbago with sciatica, left side: Secondary | ICD-10-CM | POA: Diagnosis not present

## 2019-09-09 DIAGNOSIS — R293 Abnormal posture: Secondary | ICD-10-CM | POA: Diagnosis not present

## 2019-09-09 NOTE — Therapy (Addendum)
[facility] [address] [place], [place], [address] Phone: [phone]   Fax:  [phone]  Physical Therapy Treatment/ Discharge Summary   Patient Details  Name: Erica [name] MRN: [id number] Date of Birth: 09/04/1932 Referring Provider (PT): [name] MD    Encounter Date: 09/09/2019   PHYSICAL THERAPY DISCHARGE SUMMARY  Visits from Start of Care: 5  Current functional level related to goals / functional outcomes: See below    Remaining deficits: See below    Education / Equipment: See assessment  Plan: Patient agrees to discharge.  Patient goals were met. Patient is being discharged due to meeting the stated rehab goals.  ?????        PT End of Session - 09/09/19 1407    Visit Number 5    Number of Visits 8    Date for PT Re-Evaluation 09/06/19    Authorization Type BCBS Medicare (No auth, no V.L.)    Authorization - Visit Number 5    Authorization - Number of Visits 8    Progress Note Due on Visit 8    PT Start Time 8144    PT Stop Time 1400    PT Time Calculation (min) 34 min    Activity Tolerance Patient tolerated treatment well    Behavior During Therapy WFL for tasks assessed/performed           Past Medical History:  Diagnosis Date  . Anxiety   . Bipolar disorder ([place])   . Hyperlipidemia   . Hypothyroidism   . Irritable bowel syndrome   . RA (rheumatoid arthritis) ([place])     Past Surgical History:  Procedure Laterality Date  . ABDOMINAL HYSTERECTOMY    . Bone Spurs  08/07/13   Right Shoulder  . COLONOSCOPY    . COLONOSCOPY N/A 05/08/2012   Procedure: COLONOSCOPY;  Surgeon: Rogene Houston, MD;  [facility];  Service: Endoscopy;  Laterality: N/A;  730  . COLOSTOMY    . ESOPHAGOGASTRODUODENOSCOPY N/A 11/04/2013   Procedure: ESOPHAGOGASTRODUODENOSCOPY (EGD);  Surgeon: Rogene Houston, MD;  Location: [facility];  Service: Endoscopy;  Laterality: N/A;  730  . EYE SURGERY  cataract  x 2  . MALONEY DILATION N/A 11/04/2013   Procedure: Venia Minks DILATION;  Surgeon: Rogene Houston, MD;  [facility];  Service: Endoscopy;  Laterality: N/A;  . UPPER GASTROINTESTINAL ENDOSCOPY      There were no vitals filed for this visit.   Subjective Assessment - 09/09/19 1332    Subjective constant pain from her rheumatoid arthritis and LBP that comes and goes.  States she is doine a few of her balance actvities at home but not all the ones issued.    Currently in Pain? No/denies              [facility] PT Assessment - 09/09/19 1334      Assessment   Medical Diagnosis Gait instability     Referring Provider (PT) [name] MD     Onset Date/Surgical Date --   about 1 year ago    Prior Therapy Yes for low back, not for balance       Precautions   Precautions Fall      Restrictions   Weight Bearing Restrictions No      Home Environment   Living Environment --    Living Arrangements --    Available Help at Discharge --      Prior Function  Level of Independence Independent with basic ADLs   has assistant help with daily tasks (cooking, cleaning etc)   Vocation Retired      Charity fundraiser Status Within Functional Limits for tasks assessed      Strength   Right Hip Flexion 5/5   was 4/5   Right Hip Extension 4+/5   was 4/5   Right Hip ABduction 5/5   was 4/5   Left Hip Flexion 5/5   was 4/5   Left Hip Extension 4+/5   was 4+/5   Left Hip ABduction 5/5   was 4/5   Right Knee Flexion 5/5   wa 4+/5   Right Knee Extension 5/5   was 5/5   Left Knee Flexion 5/5   was 4+/5   Left Knee Extension 5/5   was 5/5   Right Ankle Dorsiflexion 5/5   was 5/5   Left Ankle Dorsiflexion 5/5   was 5/5     Transfers   Five time sit to stand comments  11 sec with minimal use of UEs       Ambulation/Gait   Ambulation/Gait Yes    Ambulation/Gait Assistance 6: Modified independent (Device/Increase time)    Assistive device Straight cane    Gait Pattern --        Standardized Balance Assessment   Standardized Balance Assessment Berg Balance Test      Berg Balance Test   Sit to Stand Able to stand without using hands and stabilize independently    Standing Unsupported Able to stand safely 2 minutes    Sitting with Back Unsupported but Feet Supported on Floor or Stool Able to sit safely and securely 2 minutes    Stand to Sit Sits safely with minimal use of hands    Transfers Able to transfer safely, minor use of hands    Standing Unsupported with Eyes Closed Able to stand 10 seconds safely    Standing Unsupported with Feet Together Able to place feet together independently and stand 1 minute safely    From Standing, Reach Forward with Outstretched Arm Can reach confidently >25 cm (10")    From Standing Position, Pick up Object from Floor Able to pick up shoe safely and easily    From Standing Position, Turn to Look Behind Over each Shoulder Looks behind from both sides and weight shifts well    Turn 360 Degrees Able to turn 360 degrees safely in 4 seconds or less    Standing Unsupported, Alternately Place Feet on Step/Stool Able to stand independently and safely and complete 8 steps in 20 seconds    Standing Unsupported, One Foot in Front Able to place foot tandem independently and hold 30 seconds    Standing on One Leg Able to lift leg independently and hold > 10 seconds    Total Score 56    Berg comment: was 49/56                                 PT Education - 09/09/19 1402    Education Details review of HEP, educated to continue these and set a time each day (as if coming to therapy) to commit to doing them.    Person(s) Educated Patient    Methods Explanation    Comprehension Verbalized understanding            PT Short Term Goals - 09/09/19 1346  PT SHORT TERM GOAL #1   Title Patient will be independent with initial HEP and self-management strategies to improve functional outcomes    Time 2    Period  Weeks    Status Achieved    Target Date 08/23/19             PT Long Term Goals - 09/09/19 1347      PT LONG TERM GOAL #1   Title Patient will have equal to or > 4+/5 MMT throughout BLE to improve ability to perform functional mobility, stair ambulation and ADLs.    Time 4    Period Weeks    Status Achieved      PT LONG TERM GOAL #2   Title Patient will report at least 75% overall improvement in subjective complaint to indicate improvement in ability to perform ADLs.    Time 4    Period Weeks    Status Achieved      PT LONG TERM GOAL #3   Title Patient will improve BERG score to 52/56 to indicate improved balance, functional mobility and reduced risk for falls.    Time 4    Period Weeks    Status Achieved                 Plan - 09/09/19 1408    Clinical Impression Statement Pt running late for appt today.  Due for re-evaluation with findings consistent with readiness to be discharged.  Pt has met all STG/LTG's and feels she can continue her therex independently at home.   Balance has improved to baseline and feels her meds sometimes have more to do with her instability than her actual weakness.  Pt continues to use her SPC as she prefers to have it rather than attempt ambulating without it.  Pt without any additional questions or concerns.    Personal Factors and Comorbidities Age    Examination-Activity Limitations Lift;Squat;Stand;Transfers;Locomotion Level;Dressing;Stairs    Examination-Participation Restrictions Cleaning;Laundry;Community Activity;Yard Work    Stability/Clinical Decision Making Stable/Uncomplicated    Rehab Potential Good    PT Frequency 2x / week    PT Duration 4 weeks    PT Treatment/Interventions ADLs/Self Care Home Management;Aquatic Therapy;Cryotherapy;Electrical Stimulation;Iontophoresis 19m/ml Dexamethasone;Moist Heat;Traction;Functional mobility training;Therapeutic activities;Therapeutic exercise;Balance training;Neuromuscular  re-education;Manual techniques;Patient/family education;Passive range of motion;Biofeedback;Contrast Bath;Fluidtherapy;Parrafin;Ultrasound;Vestibular;Visual/perceptual remediation/compensation;Gait training;Stair training;Dry needling;Energy conservation;Spinal Manipulations;Joint Manipulations;Manual lymph drainage;Splinting;Vasopneumatic Device;Compression bandaging;Orthotic Fit/Training;Taping;DME Instruction    PT Next Visit Plan discharge to HEP.    PT Home Exercise Plan 08/20/19: STS, tandem stance and heel raises.    Consulted and Agree with Plan of Care Patient           Patient will benefit from skilled therapeutic intervention in order to improve the following deficits and impairments:  Abnormal gait, Decreased mobility, Decreased activity tolerance, Difficulty walking, Decreased strength, Decreased balance, Decreased endurance  Visit Diagnosis: Unsteadiness on feet  Muscle weakness (generalized)     Problem List Patient Active Problem List   Diagnosis Date Noted  . Bloating 01/28/2019  . Boil of groin 12/31/2018  . Constipation in female 06/22/2017  . Fall 05/07/2017  . Right rib fracture 05/07/2017  . AKI (acute kidney injury) ([place] 05/04/2017  . Hyponatremia 05/03/2017  . Bipolar disorder with severe depression ([place] 11/23/2016  . Hypertension 11/23/2016  . Rheumatoid arthritis involving both hands with positive rheumatoid factor ([place] 06/01/2016  . Partial small bowel obstruction ([place] 03/12/2015  . Depression 03/12/2015  . Hypotension 03/12/2015  . Lactic acidosis 03/12/2015  . Dehydration 03/12/2015  .  Small bowel obstruction ([place]) 03/12/2015  . Hypothyroidism 02/21/2014  . 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 Major depressive disorder, recurrent episode with anxious distress ([place]) 02/13/2014  . Tachycardia 08/24/2013  . S/P arthroscopy of shoulder 08/14/2013  . Muscle weakness (generalized) 06/14/2013  . Rotator cuff syndrome of right shoulder 12/05/2012  .  Right shoulder pain 12/05/2012  . Rectal bleeding 06/06/2011  . Epigastric pain 04/19/2011  . GERD (gastroesophageal reflux disease) 04/19/2011  . IBS (irritable bowel syndrome) 04/19/2011  . METATARSALGIA 05/12/2009  . BUNION 05/12/2009   Teena Irani, PTA/CLT [phone]  Roseanne Reno B 09/09/2019, 2:10 PM  4:18 PM, 09/10/19 Josue Hector PT DPT  Physical Therapist with [facility]  [phone] [place] [address] [place], [place], [address] Phone: [phone]   Fax:  [phone]  Name: SHANECIA HOGANSON MRN: [id number] Date of Birth: 1932/08/23

## 2019-09-11 ENCOUNTER — Encounter (HOSPITAL_COMMUNITY): Payer: Medicare Other | Admitting: Physical Therapy

## 2019-09-11 IMAGING — DX DG CHEST 2V
2 series · 2 of 2 positions shown · non-contrast
Comparison: June 30, 2015

CLINICAL DATA: Shortness of breath for 2 weeks.

EXAM:
CHEST  2 VIEW

[chest pa]
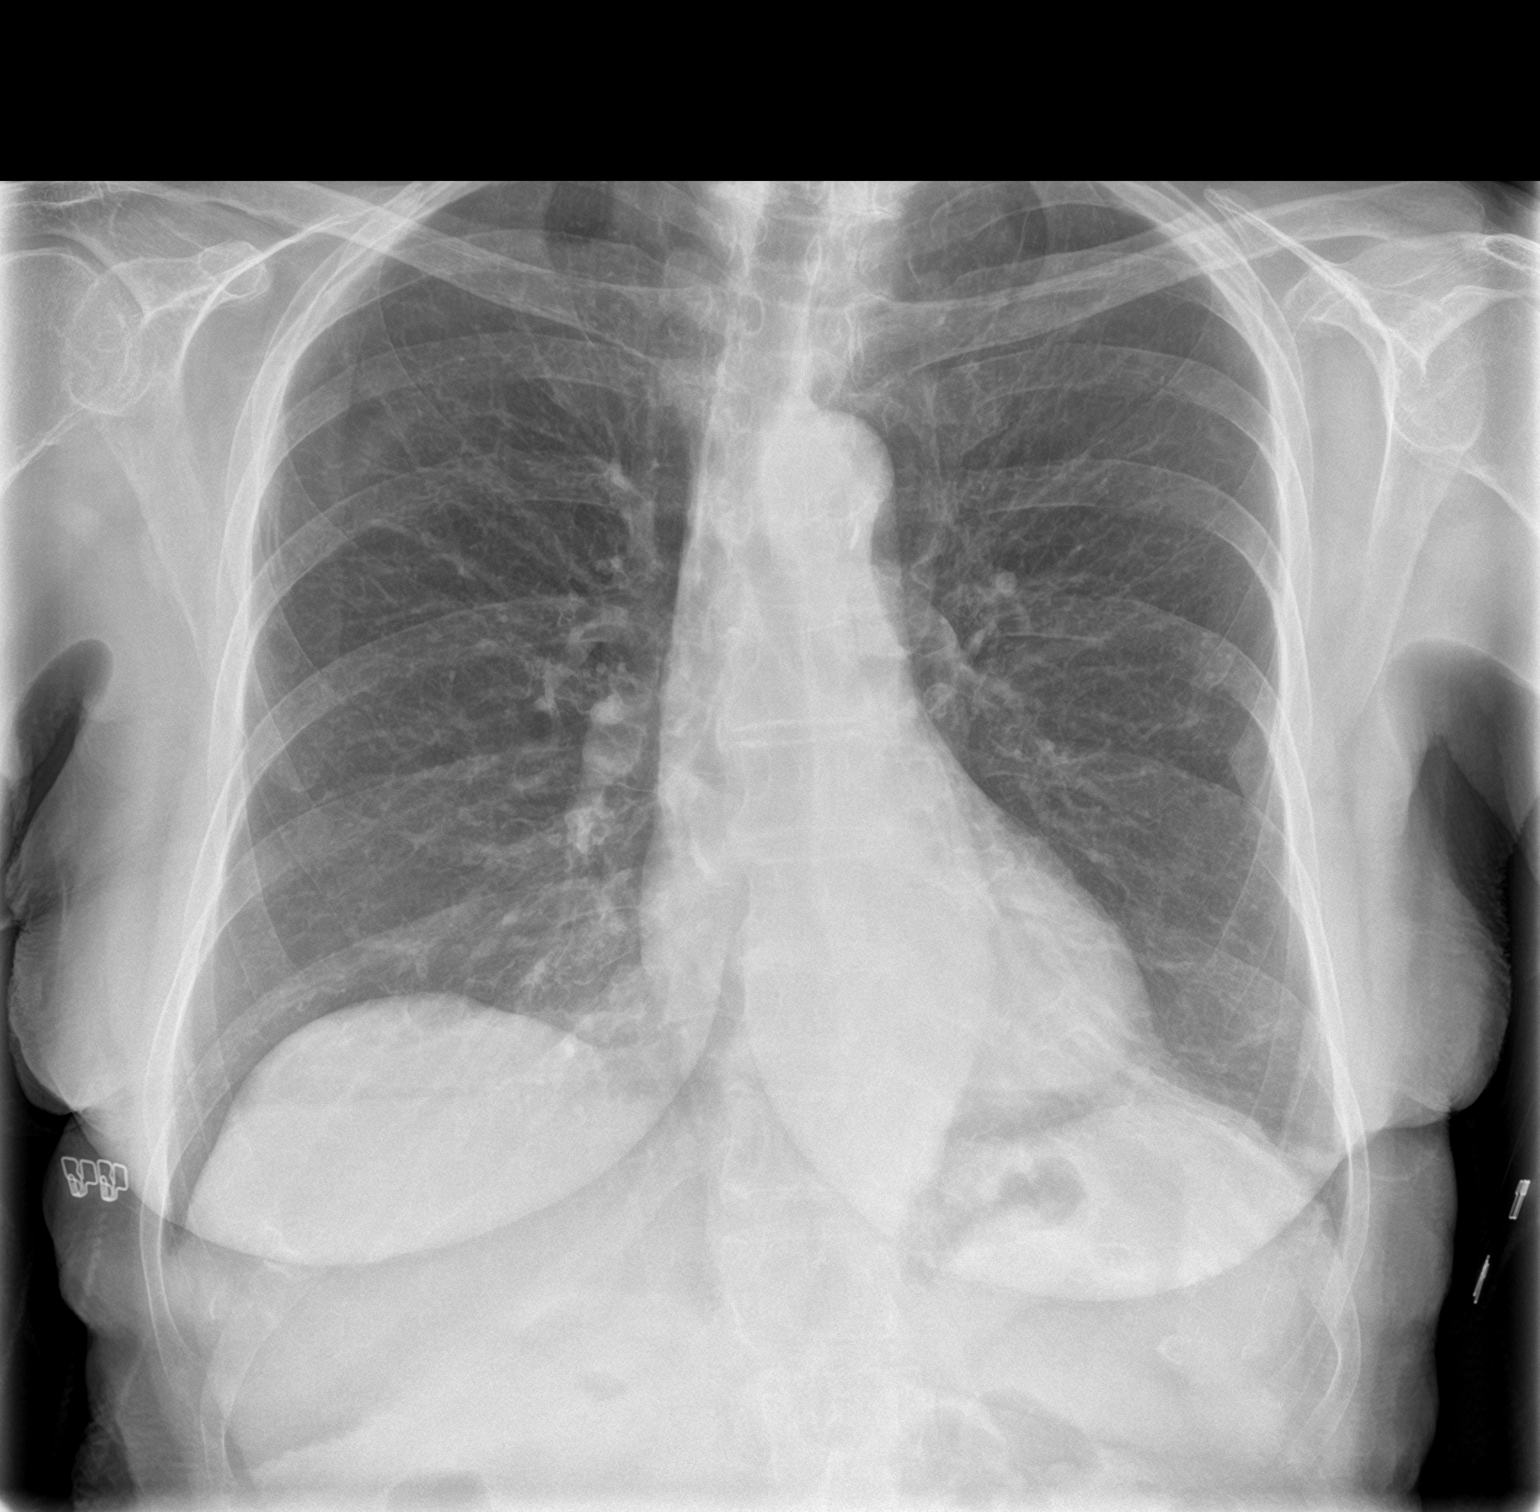

[chest lat]
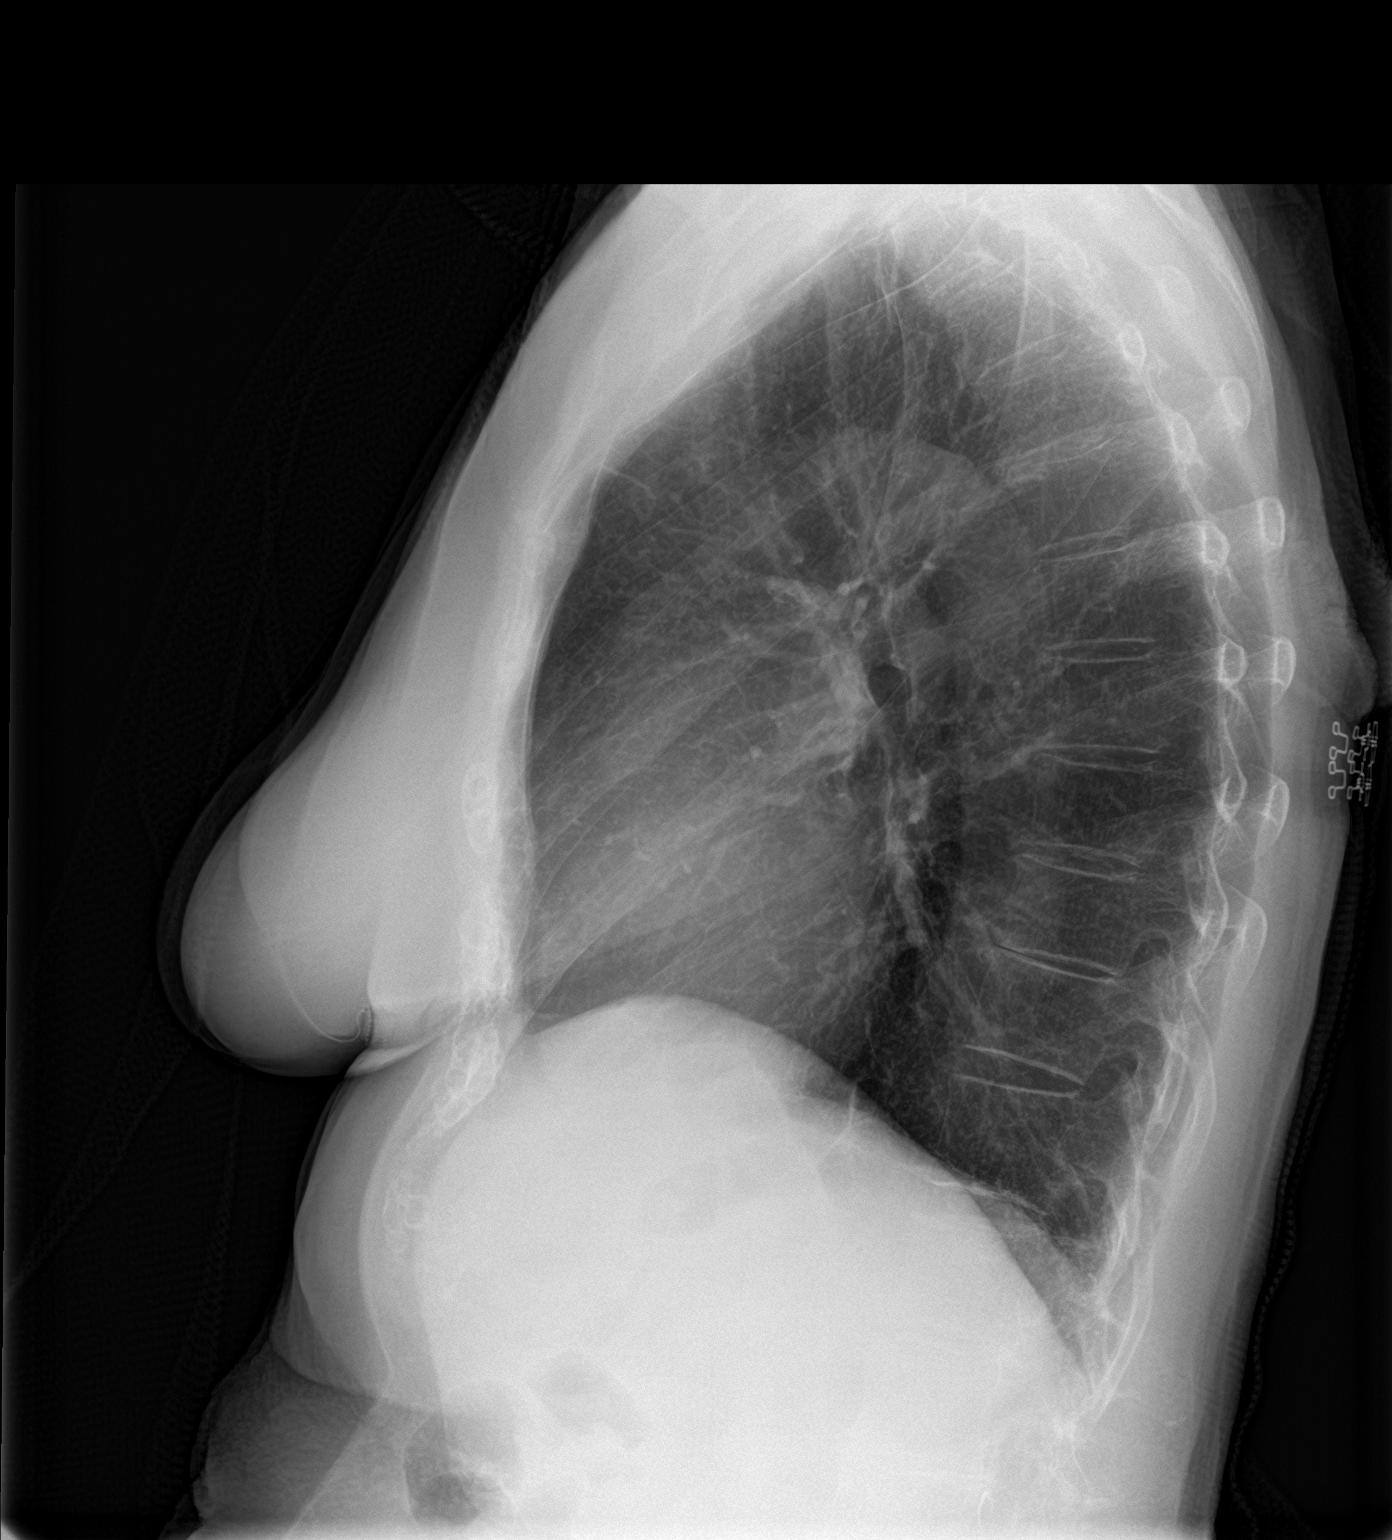

[2 of 2 positions shown; findings below may reference images not displayed]

FINDINGS: The heart size and mediastinal contours are within normal limits.
Both lungs are clear. The visualized skeletal structures are
unremarkable.
IMPRESSION: No active cardiopulmonary disease.

## 2019-09-18 ENCOUNTER — Telehealth (INDEPENDENT_AMBULATORY_CARE_PROVIDER_SITE_OTHER): Payer: Self-pay | Admitting: Internal Medicine

## 2019-09-18 DIAGNOSIS — E039 Hypothyroidism, unspecified: Secondary | ICD-10-CM | POA: Diagnosis not present

## 2019-09-18 DIAGNOSIS — K5904 Chronic idiopathic constipation: Secondary | ICD-10-CM | POA: Diagnosis not present

## 2019-09-18 DIAGNOSIS — R3989 Other symptoms and signs involving the genitourinary system: Secondary | ICD-10-CM | POA: Diagnosis not present

## 2019-09-18 DIAGNOSIS — I10 Essential (primary) hypertension: Secondary | ICD-10-CM | POA: Diagnosis not present

## 2019-09-18 DIAGNOSIS — F3341 Major depressive disorder, recurrent, in partial remission: Secondary | ICD-10-CM | POA: Diagnosis not present

## 2019-09-18 NOTE — Telephone Encounter (Signed)
Dr. Aliene Beams called me both Ms. Erica [name] ongoing symptoms.  She has been treated for urinary tract infection 3 different times.  She still remains with lower abdominal pressure and pain and has intractable bloating every time she eats.  She has a history of diverticular stricture status post surgery 7 years ago. Patient is high risk for colorectal cancer because of family history.  Last colonoscopy was in February 2014.  Because of her age we decided not to proceed with further exams. Patient symptoms are worrisome and warrants further investigation. Proceed with abdominal pelvic CT with contrast. She will need serum creatinine unless this was checked by Dr. Tracie Harrier.  We will call their office first.

## 2019-09-19 ENCOUNTER — Other Ambulatory Visit (INDEPENDENT_AMBULATORY_CARE_PROVIDER_SITE_OTHER): Payer: Self-pay | Admitting: *Deleted

## 2019-09-19 ENCOUNTER — Encounter (INDEPENDENT_AMBULATORY_CARE_PROVIDER_SITE_OTHER): Payer: Self-pay

## 2019-09-19 DIAGNOSIS — R1030 Lower abdominal pain, unspecified: Secondary | ICD-10-CM

## 2019-09-23 ENCOUNTER — Telehealth: Payer: Self-pay | Admitting: Physical Medicine and Rehabilitation

## 2019-09-23 NOTE — Telephone Encounter (Signed)
Proficient referral for low back pain. Ok for new patient OV?

## 2019-09-23 NOTE — Telephone Encounter (Signed)
OV, has had esi L4 TF by Dr. Anise Salvo at [facility], they talked about doing interlam, MRI 2020 in chart

## 2019-09-24 NOTE — Telephone Encounter (Signed)
Scheduled for 7/27 at 1030.

## 2019-09-26 ENCOUNTER — Other Ambulatory Visit: Payer: Self-pay

## 2019-09-26 ENCOUNTER — Ambulatory Visit (HOSPITAL_COMMUNITY): Payer: Medicare Other

## 2019-09-26 ENCOUNTER — Ambulatory Visit (HOSPITAL_COMMUNITY)
Admission: RE | Admit: 2019-09-26 | Discharge: 2019-09-26 | Disposition: A | Discharge status: Home / Self Care | Payer: Medicare Other | Source: Ambulatory Visit | Attending: Internal Medicine | Admitting: Internal Medicine

## 2019-09-26 DIAGNOSIS — R1030 Lower abdominal pain, unspecified: Secondary | ICD-10-CM | POA: Diagnosis not present

## 2019-09-26 DIAGNOSIS — K449 Diaphragmatic hernia without obstruction or gangrene: Secondary | ICD-10-CM | POA: Diagnosis not present

## 2019-09-26 DIAGNOSIS — I700 Atherosclerosis of aorta: Secondary | ICD-10-CM | POA: Diagnosis not present

## 2019-09-26 DIAGNOSIS — M4186 Other forms of scoliosis, lumbar region: Secondary | ICD-10-CM | POA: Diagnosis not present

## 2019-09-26 DIAGNOSIS — M47816 Spondylosis without myelopathy or radiculopathy, lumbar region: Secondary | ICD-10-CM | POA: Diagnosis not present

## 2019-09-26 LAB — POCT I-STAT CREATININE: Creatinine, Ser: 0.8 mg/dL (ref 0.44–1.00)

## 2019-09-26 MED ORDER — IOHEXOL 300 MG/ML  SOLN
100.0000 mL | Freq: Once | INTRAMUSCULAR | Status: AC | PRN
  Administered 2019-09-26: 100 mL via INTRAVENOUS

## 2019-10-08 ENCOUNTER — Ambulatory Visit (INDEPENDENT_AMBULATORY_CARE_PROVIDER_SITE_OTHER): Payer: Medicare Other | Admitting: Physical Medicine and Rehabilitation

## 2019-10-08 ENCOUNTER — Telehealth: Payer: Self-pay | Admitting: Physical Medicine and Rehabilitation

## 2019-10-08 ENCOUNTER — Other Ambulatory Visit: Payer: Self-pay

## 2019-10-08 ENCOUNTER — Encounter: Payer: Self-pay | Admitting: Physical Medicine and Rehabilitation

## 2019-10-08 VITALS — BP 146/89 | HR 105

## 2019-10-08 DIAGNOSIS — M48062 Spinal stenosis, lumbar region with neurogenic claudication: Secondary | ICD-10-CM

## 2019-10-08 DIAGNOSIS — M5441 Lumbago with sciatica, right side: Secondary | ICD-10-CM

## 2019-10-08 DIAGNOSIS — F339 Major depressive disorder, recurrent, unspecified: Secondary | ICD-10-CM

## 2019-10-08 DIAGNOSIS — G8929 Other chronic pain: Secondary | ICD-10-CM

## 2019-10-08 DIAGNOSIS — M5416 Radiculopathy, lumbar region: Secondary | ICD-10-CM

## 2019-10-08 DIAGNOSIS — I011 Acute rheumatic endocarditis: Secondary | ICD-10-CM

## 2019-10-08 DIAGNOSIS — R531 Weakness: Secondary | ICD-10-CM

## 2019-10-08 DIAGNOSIS — M47816 Spondylosis without myelopathy or radiculopathy, lumbar region: Secondary | ICD-10-CM

## 2019-10-08 DIAGNOSIS — M5442 Lumbago with sciatica, left side: Secondary | ICD-10-CM

## 2019-10-08 NOTE — Telephone Encounter (Signed)
Did you mean me or Shena?

## 2019-10-08 NOTE — Progress Notes (Signed)
Walking and standing cause feeling of weakness in both legs. Comes and goes. Seems to be affected by the weather sometimes. History of RA. Numeric Pain Rating Scale and Functional Assessment Average Pain 7 Pain Right Now 4 My pain is intermittent, aching and feeling of weakness Pain is worse with: walking and standing Pain improves with: rest   In the last MONTH (on 0-10 scale) has pain interfered with the following?  1. General activity like being  able to carry out your everyday physical activities such as walking, climbing stairs, carrying groceries, or moving a chair?  Rating(8)  2. Relation with others like being able to carry out your usual social activities and roles such as  activities at home, at work and in your community. Rating(8)  3. Enjoyment of life such that you have  been bothered by emotional problems such as feeling anxious, depressed or irritable?  Rating(10)

## 2019-10-08 NOTE — Telephone Encounter (Signed)
Is auth needed for [id number]? Scheduled for 8/30.

## 2019-10-09 ENCOUNTER — Encounter: Payer: Self-pay | Admitting: Physical Medicine and Rehabilitation

## 2019-10-09 NOTE — Progress Notes (Signed)
Erica [name] - 87 y.o. female MRN [id number]  Date of birth: Jan 31, 1933  Office Visit Note: Visit Date: 10/08/2019 PCP: Aliene Beams, MD Referred by: Aliene Beams, MD  Subjective: No chief complaint on file.  HPI: Erica [name] is a 86 y.o. female who comes in today At the request of Dr. Aliene Beams for evaluation management of chronic worsening severe low back pain and bilateral leg pain with weakness.  She has a history of lumbar stenosis and prior injection therapy with somewhat conflicting results.  She is present today with her daughter-in-law who provides some of the history.  I did review notes from Dr. Shellia Cleverly which numbered many pages as well as prior history of injection treatment by Dr. Dannielle Huh at [facility] as part of the [facility] system.  In the fall of last year she had a transforaminal epidural steroid injection at L4 and L5 on the left they gave her really quite a few months of relief but then had a repeat injection in December of the same exact injection by Dr. Anise Salvo that offered little help.  His notes suggest the next attempt would have been an interlaminar injection at L5-S1.  Speaking with the patient today she reports a long history of back pain as far back as 2014 and then another episode in 2016.  She was initially seen by Dr. Trey Sailors who was the surgeon of her husband.  Unfortunately her history is very clouded by history of severe depression with multiple treatments for many years including electroconvulsive therapy.  She is currently on an MAOI.  She really spends most of the clinical interview talking about depression.  Her husband passed away last year and she is done fairly well with that.  She reports her biggest problem is not so much the back pain itself but it is the inability to walk any distance without having to sit down do to pain in the buttocks and legs and a feeling of weakness.  Clear symptoms of a  claudication symptom.  She currently takes Tylenol for pain relief and just rest and does not do much.  Her case is also complicated by rheumatoid arthritis.  She has had all manner of care for the rheumatoid arthritis and really has not done well with that either.  She does not do well with pain medications in general.  She is not had focal weakness or foot drop.  No recent trauma no prior lumbar surgery.  She has a wedding in March of next year that she really wants to attend in [place] at this point is trying to see if there is any way that she can get to a point where she can do that more manageable he.  She has been in extensive physical therapy at [facility] and those notes are reviewed as well.  She has had updated MRI and that is also reviewed with the patient today and reviewed below.  This shows moderate to severe stenosis at L3-4 and more severe but moderate to severe at L4-5 multifactorial lumbar spondylosis and some degree of listhesis.  Review of Systems  Musculoskeletal: Positive for back pain and joint pain.  Neurological: Positive for weakness.  Psychiatric/Behavioral: Positive for depression.  All other systems reviewed and are negative.  Otherwise per HPI.  Assessment & Plan: Visit Diagnoses:  1. Spinal stenosis of lumbar region with neurogenic claudication   2. Lumbar radiculopathy   3. Spondylosis without myelopathy or  radiculopathy, lumbar region   4. Chronic bilateral low back pain with bilateral sciatica   5. Weakness   6. Major depressive disorder, recurrent episode with anxious distress (HCC)   7. Rheumatoid aortitis     Plan: Findings:  Fairly consistent picture of lumbar stenosis with claudication symptoms worsening over the last several years with at least some degree of benefit from transforaminal epidural steroid injection particularly on the left at L5 and S1.  Long history of progressive stenosis and progressive symptoms.  Case is very complicated due to  underlying severe depression and anxiety.  Rheumatoid arthritis also a factor but probably not so much for her lumbar spine and we discussed this at length.  Dr. Anise Salvo who she saw in [facility] suggested interlaminar epidural after the second injection did not seem to help.  This was delayed do to having coronavirus vaccine and they wanted to hold off.  Evidently getting more of the history, her son is friends with Dr. Georgena Spurling who had initially referred them to a pain management person in the [facility] system since that so he works with.  She wanted to come to someone closer when traveling from her place of residence out to [facility].  She does wish to pursue any treatment that may help.  My recommendation would be interlaminar approach at L5-S1 just to see how much relief she gets with that and we could prepare accordingly leading up to the March wedding that she wants to attend.  She wants to delay the injection at least a little bit while she is dealing with her depression.  They have increased her medication recently and she wants to see how that goes.  She will continue with home exercises and current medication treatment.  We will schedule her for epidural injection left L5-S1 interlaminar injection sometime in August.    Meds & Orders: No orders of the defined types were placed in this encounter.  No orders of the defined types were placed in this encounter.   Follow-up: No follow-ups on file.   Procedures: No procedures performed  No notes on file   Clinical History: MRI LUMBAR SPINE WITHOUT CONTRAST   TECHNIQUE:  Multiplanar, multisequence MR imaging of the lumbar spine was  performed. No intravenous contrast was administered.   COMPARISON: None.   FINDINGS:  Segmentation: Standard.   Alignment: 2 mm retrolisthesis of L1 on L2 and L2 on L3.  levoscoliosis of the lumbar spine.   Vertebrae: No fracture, evidence of discitis, or bone lesion.   Conus medullaris and  cauda equina: Conus extends to the T11-12  level. Conus and cauda equina appear normal.   Paraspinal and other soft tissues: No acute paraspinal abnormality.   Disc levels:   Disc spaces: Degenerative disease with disc height loss at T12-L1,  L1-2, L2-3 and L3-4.   T11-12: Small central disc protrusion. No foraminal or central canal  stenosis.   T12-L1: Broad-based disc bulge. Moderate right and mild left facet  arthropathy. Right lateral recess stenosis. Mild-moderate spinal  stenosis. Moderate left foraminal stenosis. Moderate right foraminal  stenosis.   L1-L2: Broad-based disc bulge with a central focal disc osteophyte  complex. Moderate bilateral facet arthropathy with ligamentum flavum  infolding. Right lateral recess stenosis. Mild-moderate spinal  stenosis. Moderate right foraminal stenosis. No left foraminal  stenosis.   L2-L3: Broad-based disc osteophyte complex. Moderate bilateral facet  arthropathy. Mild right foraminal stenosis. No left foraminal  stenosis. No central canal stenosis.   L3-L4:  Broad-based disc bulge. Severe bilateral facet arthropathy.  Moderate spinal stenosis. Mild bilateral foraminal stenosis.   L4-L5: Broad-based disc bulge. Severe bilateral facet arthropathy  with ligamentum flavum infolding and subchondral marrow edema.  Moderate-severe spinal stenosis. Severe left foraminal stenosis.  Mild right foraminal stenosis.   L5-S1: Mild broad-based disc bulge. Moderate bilateral facet  arthropathy. Mild left foraminal stenosis. No central canal  stenosis.   IMPRESSION:  1. Diffuse lumbar spine spondylosisas described above.    Electronically Signed  By: Elige Ko  On: 09/24/2018 11:46   She reports that she quit smoking about 38 years ago. Her smoking use included cigarettes. She has a 40.50 pack-year smoking history. She quit smokeless tobacco use about 38 years ago. No results for input(s): HGBA1C, LABURIC in the last 8760  hours.  Objective:  VS:  HT:    WT:   BMI:     BP:(!) 146/89  HR:105bpm  TEMP: ( )  RESP:  Physical Exam Vitals and nursing note reviewed.  Constitutional:      General: She is not in acute distress.    Appearance: Normal appearance. She is well-developed and normal weight.  HENT:     Head: Normocephalic and atraumatic.     Nose: Nose normal.     Mouth/Throat:     Mouth: Mucous membranes are moist.     Pharynx: Oropharynx is clear.  Eyes:     Conjunctiva/sclera: Conjunctivae normal.     Pupils: Pupils are equal, round, and reactive to light.  Cardiovascular:     Rate and Rhythm: Regular rhythm.  Pulmonary:     Effort: Pulmonary effort is normal. No respiratory distress.  Abdominal:     General: There is no distension.     Palpations: Abdomen is soft.     Tenderness: There is no guarding.  Musculoskeletal:     Cervical back: Normal range of motion and neck supple.     Right lower leg: No edema.     Left lower leg: No edema.     Comments: Patient ambulates with a cane with forward flexed lumbar spine.  She is somewhat slow to rise from seated position does have pain with extension and facet loading of the lumbar spine.  No pain with hip rotation good distal strength without clonus.  Skin:    General: Skin is warm and dry.     Findings: No erythema or rash.  Neurological:     General: No focal deficit present.     Mental Status: She is alert and oriented to person, place, and time.     Sensory: No sensory deficit.     Motor: No weakness or abnormal muscle tone.     Coordination: Coordination normal.     Gait: Gait abnormal.  Psychiatric:        Mood and Affect: Mood normal.        Behavior: Behavior normal.        Thought Content: Thought content normal.     Ortho Exam  Imaging: No results found.  Past Medical/Family/Surgical/Social History: Medications & Allergies reviewed per EMR, new medications updated. Patient Active Problem List   Diagnosis Date Noted   . Bloating 01/28/2019  . Boil of groin 12/31/2018  . Constipation in female 06/22/2017  . Fall 05/07/2017  . Right rib fracture 05/07/2017  . AKI (acute kidney injury) (HCC) 05/04/2017  . Hyponatremia 05/03/2017  . Bipolar disorder with severe depression (HCC) 11/23/2016  . Hypertension 11/23/2016  . Rheumatoid arthritis involving both  hands with positive rheumatoid factor (HCC) 06/01/2016  . Partial small bowel obstruction (HCC) 03/12/2015  . Depression 03/12/2015  . Hypotension 03/12/2015  . Lactic acidosis 03/12/2015  . Dehydration 03/12/2015  . Small bowel obstruction (HCC) 03/12/2015  . Hypothyroidism 02/21/2014  . 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 Major depressive disorder, recurrent episode with anxious distress (HCC) 02/13/2014  . Tachycardia 08/24/2013  . S/P arthroscopy of shoulder 08/14/2013  . Muscle weakness (generalized) 06/14/2013  . Rotator cuff syndrome of right shoulder 12/05/2012  . Right shoulder pain 12/05/2012  . Rectal bleeding 06/06/2011  . Epigastric pain 04/19/2011  . GERD (gastroesophageal reflux disease) 04/19/2011  . IBS (irritable bowel syndrome) 04/19/2011  . METATARSALGIA 05/12/2009  . BUNION 05/12/2009   Past Medical History:  Diagnosis Date  . Anxiety   . Bipolar disorder (HCC)   . Hyperlipidemia   . Hypothyroidism   . Irritable bowel syndrome   . RA (rheumatoid arthritis) (HCC)    Family History  Problem Relation Age of Onset  . Colon cancer Mother   . Bipolar disorder Mother   . Anxiety disorder Mother   . Atrial fibrillation Son   . Depression Maternal Grandmother    Past Surgical History:  Procedure Laterality Date  . ABDOMINAL HYSTERECTOMY    . Bone Spurs  08/07/13   Right Shoulder  . COLONOSCOPY    . COLONOSCOPY N/A 05/08/2012   Procedure: COLONOSCOPY;  Surgeon: Malissa Hippo, MD;  [facility];  Service: Endoscopy;  Laterality: N/A;  730  . COLOSTOMY    . ESOPHAGOGASTRODUODENOSCOPY N/A 11/04/2013    Procedure: ESOPHAGOGASTRODUODENOSCOPY (EGD);  Surgeon: Malissa Hippo, MD;  [facility];  Service: Endoscopy;  Laterality: N/A;  730  . EYE SURGERY  cataract x 2  . MALONEY DILATION N/A 11/04/2013   Procedure: Elease Hashimoto DILATION;  Surgeon: Malissa Hippo, MD;  [facility]: [facility];  Service: Endoscopy;  Laterality: N/A;  . UPPER GASTROINTESTINAL ENDOSCOPY     Social History   Occupational History  . Not on file  Tobacco Use  . Smoking status: Former Smoker    Packs/day: 1.50    Years: 27.00    Pack years: 40.50    Types: Cigarettes    Quit date: 04/18/1981    Years since quitting: 38.5  . Smokeless tobacco: Former Neurosurgeon    Quit date: 11/27/1980  Vaping Use  . Vaping Use: Never used  Substance and Sexual Activity  . Alcohol use: No    Alcohol/week: 0.0 standard drinks  . Drug use: No  . Sexual activity: Not Currently    Partners: Male    Birth control/protection: Post-menopausal, Surgical    Comment: hyst

## 2019-10-09 NOTE — Telephone Encounter (Signed)
Auth# not needed for [id number] on the [employer] PPA list.

## 2019-10-15 DIAGNOSIS — I10 Essential (primary) hypertension: Secondary | ICD-10-CM | POA: Diagnosis not present

## 2019-10-15 DIAGNOSIS — F333 Major depressive disorder, recurrent, severe with psychotic symptoms: Secondary | ICD-10-CM | POA: Diagnosis not present

## 2019-11-05 ENCOUNTER — Telehealth: Payer: Self-pay | Admitting: Physical Medicine and Rehabilitation

## 2019-11-05 NOTE — Telephone Encounter (Signed)
Patient called.   Requesting a call back to reschedule her appointment with [facility] on Monday 8/30  Call back: [phone]

## 2019-11-05 NOTE — Telephone Encounter (Signed)
Rescheduled

## 2019-11-07 ENCOUNTER — Other Ambulatory Visit: Payer: Self-pay

## 2019-11-07 ENCOUNTER — Encounter (INDEPENDENT_AMBULATORY_CARE_PROVIDER_SITE_OTHER): Payer: Self-pay | Admitting: Gastroenterology

## 2019-11-07 ENCOUNTER — Ambulatory Visit (INDEPENDENT_AMBULATORY_CARE_PROVIDER_SITE_OTHER): Payer: Medicare Other | Admitting: Gastroenterology

## 2019-11-07 VITALS — BP 132/86 | HR 139 | Temp 98.1°F | Ht 60.0 in | Wt 119.0 lb

## 2019-11-07 DIAGNOSIS — R140 Abdominal distension (gaseous): Secondary | ICD-10-CM

## 2019-11-07 DIAGNOSIS — K582 Mixed irritable bowel syndrome: Secondary | ICD-10-CM | POA: Diagnosis not present

## 2019-11-07 MED ORDER — LINACLOTIDE 72 MCG PO CAPS: 72.0000 ug | ORAL_CAPSULE | Freq: Every day | ORAL | 2 refills | Status: DC

## 2019-11-07 NOTE — Patient Instructions (Addendum)
Follow up with psychiatry Start IBGard 1 tablet every 8-12 hours or 2 drops of peppermint oil ever 4-6 hours as needed for bloating Start Linzess 72 mcg qday RTC 6 months

## 2019-11-07 NOTE — Progress Notes (Signed)
Katrinka Blazing, M.D. Gastroenterology & Hepatology [facility] [address] [place], [place] [address]  Primary Care Physician: Aliene Beams, MD [address] [place] [address]  I will communicate my assessment and recommendations to the referring MD via EMR. "Note: Occasional unusual wording and randomly placed punctuation marks may result from the use of speech recognition technology to transcribe this document"  Problems: 1. IBS-M 2. Functional bloating  History of Present Illness: Erica [name] is a 86 y.o. female with past medical history of anxiety, bipolar disorder, hyperlipidemia, hypothyroidism, IBS, and rheumatoid arthritis, who presents for follow up of episodes of bloating.  The patient was last seen on 07/30/2019. At that time, the patient was given a prescription for pantoprazole 40 mg every day for her chronic GERD which was well controlled.  Her IBS was relatively well controlled so no changes were done at that time.  In the interim, the patient presented recurrent bloating for which she underwent a CT scan of her abdomen and pelvis on 09/26/2019 -I personally reviewed the images, abdomen was within normal limits beside presence of hiatal hernia.  The patient reports that she is having persistent bloating despite taking her medications.  She states that she is feeling very depressed since her husband died 2 years ago but it got worse for the last 5 months.  She states that she has been having bloating on a daily basis without nausea or vomiting.  Denies having any abdominal pain due to this.  She states that for the last 2 weeks she was having a bowel movement after every meal which was unusual for her.  However, she changed her diet to gluten-free diet a week ago and she has felt mild improvement in her bloating and better bowel movement since then.  She believes that her symptoms are strongly correlated to her  depression.  She is currently following with a psychiatrist who has been trying different doses of selegiline, however the patient will look for a second opinion as "psychiatrist does not feel comfortable with this medication".  She reports feeling slightly nauseated but has not vomited.  The patient denies having any fever, chills, hematochezia, melena, hematemesis, abdominal pain, diarrhea, jaundice, pruritus or weight loss.  Last EGD: EGD was in August 2015.  She also underwent esophageal dilation with symptomatic relief of her dysphagia Last Colonoscopy: 2014 -presence of colonic anastomosis at 22 cm which was wide open, presence of patchy colitis with scattered ulcers involving the ascending colon.  Diverticulosis.  Patient's family history is positive for colon carcinoma in her mother.  Past Medical History: Past Medical History:  Diagnosis Date  . Anxiety   . Bipolar disorder (HCC)   . Hyperlipidemia   . Hypothyroidism   . Irritable bowel syndrome   . RA (rheumatoid arthritis) (HCC)     Past Surgical History: Past Surgical History:  Procedure Laterality Date  . ABDOMINAL HYSTERECTOMY    . Bone Spurs  08/07/13   Right Shoulder  . COLONOSCOPY    . COLONOSCOPY N/A 05/08/2012   Procedure: COLONOSCOPY;  Surgeon: Malissa Hippo, MD;  [facility];  Service: Endoscopy;  Laterality: N/A;  730  . COLOSTOMY    . ESOPHAGOGASTRODUODENOSCOPY N/A 11/04/2013   Procedure: ESOPHAGOGASTRODUODENOSCOPY (EGD);  Surgeon: Malissa Hippo, MD;  [facility];  Service: Endoscopy;  Laterality: N/A;  730  . EYE SURGERY  cataract x 2  . MALONEY DILATION N/A 11/04/2013  Procedure: MALONEY DILATION;  Surgeon: Malissa Hippo, MD;  [facility];  Service: Endoscopy;  Laterality: N/A;  . UPPER GASTROINTESTINAL ENDOSCOPY      Family History: Family History  Problem Relation Age of Onset  . Colon cancer Mother   . Bipolar disorder Mother   . Anxiety disorder Mother    . Atrial fibrillation Son   . Depression Maternal Grandmother     Social History: Social History   Tobacco Use  Smoking Status Former Smoker  . Packs/day: 1.50  . Years: 27.00  . Pack years: 40.50  . Types: Cigarettes  . Quit date: 04/18/1981  . Years since quitting: 38.5  Smokeless Tobacco Former Neurosurgeon  . Quit date: 11/27/1980   Social History   Substance and Sexual Activity  Alcohol Use Yes  . Alcohol/week: 1.0 standard drink  . Types: 1 Glasses of wine per week   Social History   Substance and Sexual Activity  Drug Use No    Allergies: Allergies  Allergen Reactions  . Penicillins Itching and Rash  . Hydrocodone     MAO-I ; cannot take medications.   . Morphine And Related     Unable to take due to MAO-I    Medications: Current Outpatient Medications  Medication Sig Dispense Refill  . acetaminophen (TYLENOL) 650 MG CR tablet Take 1,300 mg by mouth daily.    Marland Kitchen ALPRAZolam (XANAX PO) Take by mouth. Patient states that she takes 1/3 tablet as needed for anxiety, per patient not often.    . Cyanocobalamin (VITAMIN B-12 PO) Take by mouth daily.    Marland Kitchen losartan (COZAAR) 50 MG tablet Take 75 mg by mouth daily.     . Multiple Vitamins-Minerals (CENTRUM SILVER 50+WOMEN) TABS Take 1 tablet by mouth.    . pantoprazole (PROTONIX) 40 MG tablet Take 1 tablet (40 mg total) by mouth daily before breakfast. 90 tablet 1  . polyethylene glycol powder (GLYCOLAX/MIRALAX) powder 17 gram po qd as directed. 1530 g 1  . selegiline (EMSAM) 6 MG/24HR Place 1 patch (6 mg total) onto the skin daily. (Patient taking differently: Place 12 mg onto the skin daily. ) 30 patch 12  . SYNTHROID 75 MCG tablet Take 1 tablet (75 mcg total) by mouth daily. 90 tablet 1  . VITAMIN D PO Take 5,000 Units by mouth daily.      No current facility-administered medications for this visit.    Review of Systems: GENERAL: negative for malaise, night sweats HEENT: No changes in hearing or vision, no nose bleeds  or other nasal problems. NECK: Negative for lumps, goiter, pain and significant neck swelling RESPIRATORY: Negative for cough, wheezing CARDIOVASCULAR: Negative for chest pain, leg swelling, palpitations, orthopnea GI: SEE HPI MUSCULOSKELETAL: Negative for joint pain or swelling, back pain, and muscle pain. SKIN: Negative for lesions, rash PSYCH: Negative for sleep disturbance, mood disorder and recent psychosocial stressors. HEMATOLOGY Negative for prolonged bleeding, bruising easily, and swollen nodes. ENDOCRINE: Negative for cold or heat intolerance, polyuria, polydipsia and goiter. NEURO: negative for tremor, gait imbalance, syncope and seizures. The remainder of the review of systems is noncontributory.   Physical Exam: BP 132/86 (BP Location: Left Arm, Patient Position: Sitting, Cuff Size: Normal)   Pulse (!) 139   Temp 98.1 F (36.7 C) (Oral)   Ht 5' (1.524 m)   Wt 119 lb (54 kg)   BMI 23.24 kg/m  GENERAL: The patient is AO x3, in no acute distress. HEENT: Head is normocephalic and atraumatic. EOMI  are intact. Mouth is well hydrated and without lesions. NECK: Supple. No masses LUNGS: Clear to auscultation. No presence of rhonchi/wheezing/rales. Adequate chest expansion HEART: RRR, normal s1 and s2. ABDOMEN: nontender but moderately distended (non tense), no guarding, no peritoneal signs. BS +. No masses. EXTREMITIES: Without any cyanosis, clubbing, rash, lesions or edema. NEUROLOGIC: AOx3, no focal motor deficit. SKIN: no jaundice, no rashes  Imaging/Labs: as above  I personally reviewed and interpreted the available labs, imaging and endoscopic files.  Impression and Plan: Erica [name] is a 86 y.o. female with past medical history of anxiety, bipolar disorder, hyperlipidemia, hypothyroidism, IBS, and rheumatoid arthritis, who presents for follow up of episodes of bloating.  Patient has had chronic symptoms of bloating which are consistent with her history of IBS and  functional bloating.  She had recent CT scan that ruled out any intra-abdominal pathology leading to her presentation.  It is very likely that her symptoms are related to brain gut axis exacerbation, as she is clearly depressed and this has been very difficult to control pharmacologically.  At this point, she may benefit from starting an antispasmodic such as IBgard, while also taking Linzess 72 mcg every day as this may improve her bowel movement frequency while benefiting from its nociceptive effect.  Without any doubt, she may benefit the most from a comprehensive psychiatric evaluation which she is actively looking for.  Nevertheless, the possibility of SIBO remains in the differential which will be tested in follow-up appointment if her symptoms are refractory.  -Follow up with psychiatry -Start IBGard 1 tablet every 8-12 hours or 2 drops of peppermint oil ever 4-6 hours as needed for bloating -Start Linzess 72 mcg qday -RTC 6 months  All questions were answered.      Dolores Frame, MD Gastroenterology and Hepatology [facility]

## 2019-11-11 ENCOUNTER — Encounter: Payer: Medicare Other | Admitting: Physical Medicine and Rehabilitation

## 2019-11-21 ENCOUNTER — Ambulatory Visit: Payer: Self-pay

## 2019-12-03 DIAGNOSIS — F3341 Major depressive disorder, recurrent, in partial remission: Secondary | ICD-10-CM | POA: Diagnosis not present

## 2019-12-06 DIAGNOSIS — F3341 Major depressive disorder, recurrent, in partial remission: Secondary | ICD-10-CM | POA: Diagnosis not present

## 2019-12-09 ENCOUNTER — Ambulatory Visit: Payer: Medicare Other | Admitting: Obstetrics and Gynecology

## 2019-12-10 ENCOUNTER — Telehealth: Payer: Self-pay | Admitting: Physical Medicine and Rehabilitation

## 2019-12-10 NOTE — Telephone Encounter (Signed)
Left message #1

## 2019-12-10 NOTE — Telephone Encounter (Signed)
Pt would like to reschedule her appt that's currently set for 12/16/19  [phone]

## 2019-12-11 ENCOUNTER — Telehealth: Payer: Self-pay | Admitting: Physical Medicine and Rehabilitation

## 2019-12-11 NOTE — Telephone Encounter (Signed)
Patient returning call to [facility]. Please call patient at [phone].

## 2019-12-11 NOTE — Telephone Encounter (Signed)
Patient wanted to reschedule for November. I rescheduled her appointment for 11/15.

## 2019-12-13 ENCOUNTER — Ambulatory Visit (INDEPENDENT_AMBULATORY_CARE_PROVIDER_SITE_OTHER): Payer: Medicare Other | Admitting: Obstetrics & Gynecology

## 2019-12-13 ENCOUNTER — Encounter: Payer: Self-pay | Admitting: Obstetrics & Gynecology

## 2019-12-13 ENCOUNTER — Other Ambulatory Visit: Payer: Self-pay

## 2019-12-13 VITALS — BP 173/100 | HR 115 | Ht 60.0 in | Wt 119.5 lb

## 2019-12-13 DIAGNOSIS — R197 Diarrhea, unspecified: Secondary | ICD-10-CM | POA: Diagnosis not present

## 2019-12-13 DIAGNOSIS — N390 Urinary tract infection, site not specified: Secondary | ICD-10-CM | POA: Diagnosis not present

## 2019-12-13 DIAGNOSIS — Z1273 Encounter for screening for malignant neoplasm of ovary: Secondary | ICD-10-CM

## 2019-12-13 NOTE — Progress Notes (Signed)
Chief Complaint  Patient presents with  . feels bloated    UTI's      86 y.o. G3P3 No LMP recorded. Patient has had a hysterectomy. The current method of family planning is status post hysterectomy and post menopausal status.  Outpatient Encounter Medications as of 12/13/2019  Medication Sig Note  . acetaminophen (TYLENOL) 650 MG CR tablet Take 1,300 mg by mouth daily. 01/28/2019: As needed  . ALPRAZolam (XANAX PO) Take by mouth. Patient states that she takes 1/3 tablet as needed for anxiety, per patient not often. 11/07/2019: 1/2 once per week or prn.  . Cyanocobalamin (VITAMIN B-12 PO) Take by mouth daily.   Marland Kitchen losartan (COZAAR) 50 MG tablet Take 75 mg by mouth daily.    . Multiple Vitamins-Minerals (CENTRUM SILVER 50+WOMEN) TABS Take 1 tablet by mouth.   . pantoprazole (PROTONIX) 40 MG tablet Take 1 tablet (40 mg total) by mouth daily before breakfast. 07/30/2019: Patient states that he takes by mouth every other day.  . polyethylene glycol powder (GLYCOLAX/MIRALAX) powder 17 gram po qd as directed. 01/28/2019: Takes as needed.  . selegiline (EMSAM) 6 MG/24HR Place 1 patch (6 mg total) onto the skin daily. (Patient taking differently: Place 12 mg onto the skin daily. ) 01/31/2017: Taking 9 mg  . SYNTHROID 75 MCG tablet Take 1 tablet (75 mcg total) by mouth daily.   Marland Kitchen linaclotide (LINZESS) 72 MCG capsule Take 1 capsule (72 mcg total) by mouth daily. Can try every other day if having too many bowel movements   . [DISCONTINUED] VITAMIN D PO Take 5,000 Units by mouth daily.  07/31/2018: Patient says that she takes , when she thinks about it.   No facility-administered encounter medications on file as of 12/13/2019.    Subjective Patient is in the day to have a discussion regarding her concerns over possible ovarian cancer Specifically she has been bloated of late She has been on multiple antibiotic regimens for urinary tract infections some of which have been documented some of which  have not been with urine culture She continues to have ongoing GI complaints  Of note she had a CT scan 2 days ago which was completely normal I reviewed all of her urinalysis and urine cultures and there is certainly some missing data as it relates to culture and treatment management  Past Medical History:  Diagnosis Date  . Anxiety   . Bipolar disorder (HCC)   . Hyperlipidemia   . Hypothyroidism   . Irritable bowel syndrome   . RA (rheumatoid arthritis) (HCC)     Past Surgical History:  Procedure Laterality Date  . ABDOMINAL HYSTERECTOMY    . Bone Spurs  08/07/13   Right Shoulder  . COLONOSCOPY    . COLONOSCOPY N/A 05/08/2012   Procedure: COLONOSCOPY;  Surgeon: Malissa Hippo, MD;  [facility];  Service: Endoscopy;  Laterality: N/A;  730  . COLOSTOMY    . ESOPHAGOGASTRODUODENOSCOPY N/A 11/04/2013   Procedure: ESOPHAGOGASTRODUODENOSCOPY (EGD);  Surgeon: Malissa Hippo, MD;  Location: [facility];  Service: Endoscopy;  Laterality: N/A;  730  . EYE SURGERY  cataract x 2  . MALONEY DILATION N/A 11/04/2013   Procedure: Elease Hashimoto DILATION;  Surgeon: Malissa Hippo, MD;  Location: [facility];  Service: Endoscopy;  Laterality: N/A;  . UPPER GASTROINTESTINAL ENDOSCOPY      OB History    Gravida  3   Para  3   Term  Preterm      AB      Living  3     SAB      TAB      Ectopic      Multiple      Live Births              Allergies  Allergen Reactions  . Penicillins Itching and Rash  . Hydrocodone     MAO-I ; cannot take medications.   . Morphine And Related     Unable to take due to MAO-I    Social History   Socioeconomic History  . Marital status: Widowed    Spouse name: Not on file  . Number of children: Not on file  . Years of education: Not on file  . Highest education level: Not on file  Occupational History  . Not on file  Tobacco Use  . Smoking status: Former Smoker    Packs/day: 1.50    Years: 27.00    Pack years:  40.50    Types: Cigarettes    Quit date: 04/18/1981    Years since quitting: 38.6  . Smokeless tobacco: Former Neurosurgeon    Quit date: 11/27/1980  Vaping Use  . Vaping Use: Never used  Substance and Sexual Activity  . Alcohol use: Yes    Alcohol/week: 1.0 standard drink    Types: 1 Glasses of wine per week  . Drug use: No  . Sexual activity: Not Currently    Partners: Male    Birth control/protection: Post-menopausal, Surgical    Comment: hyst  Other Topics Concern  . Not on file  Social History Narrative  . Not on file   Social Determinants of Health   Financial Resource Strain:   . Difficulty of Paying Living Expenses: Not on file  Food Insecurity:   . Worried About Programme researcher, broadcasting/film/video in the Last Year: Not on file  . Ran Out of Food in the Last Year: Not on file  Transportation Needs:   . Lack of Transportation (Medical): Not on file  . Lack of Transportation (Non-Medical): Not on file  Physical Activity:   . Days of Exercise per Week: Not on file  . Minutes of Exercise per Session: Not on file  Stress:   . Feeling of Stress : Not on file  Social Connections:   . Frequency of Communication with Friends and Family: Not on file  . Frequency of Social Gatherings with Friends and Family: Not on file  . Attends Religious Services: Not on file  . Active Member of Clubs or Organizations: Not on file  . Attends Banker Meetings: Not on file  . Marital Status: Not on file    Family History  Problem Relation Age of Onset  . Colon cancer Mother   . Bipolar disorder Mother   . Anxiety disorder Mother   . Atrial fibrillation Son   . Depression Maternal Grandmother     Medications:       Current Outpatient Medications:  .  acetaminophen (TYLENOL) 650 MG CR tablet, Take 1,300 mg by mouth daily., Disp: , Rfl:  .  ALPRAZolam (XANAX PO), Take by mouth. Patient states that she takes 1/3 tablet as needed for anxiety, per patient not often., Disp: , Rfl:  .   Cyanocobalamin (VITAMIN B-12 PO), Take by mouth daily., Disp: , Rfl:  .  losartan (COZAAR) 50 MG tablet, Take 75 mg by mouth daily. , Disp: , Rfl:  .  Multiple Vitamins-Minerals (CENTRUM SILVER 50+WOMEN) TABS, Take 1 tablet by mouth., Disp: , Rfl:  .  pantoprazole (PROTONIX) 40 MG tablet, Take 1 tablet (40 mg total) by mouth daily before breakfast., Disp: 90 tablet, Rfl: 1 .  polyethylene glycol powder (GLYCOLAX/MIRALAX) powder, 17 gram po qd as directed., Disp: 1530 g, Rfl: 1 .  selegiline (EMSAM) 6 MG/24HR, Place 1 patch (6 mg total) onto the skin daily. (Patient taking differently: Place 12 mg onto the skin daily. ), Disp: 30 patch, Rfl: 12 .  SYNTHROID 75 MCG tablet, Take 1 tablet (75 mcg total) by mouth daily., Disp: 90 tablet, Rfl: 1 .  linaclotide (LINZESS) 72 MCG capsule, Take 1 capsule (72 mcg total) by mouth daily. Can try every other day if having too many bowel movements, Disp: 30 capsule, Rfl: 2  Objective Blood pressure (!) 173/100, pulse (!) 115, height 5' (1.524 m), weight 119 lb 8 oz (54.2 kg).  Thin elderly female no acute distress 53 kg Abdominal exam is benign somewhat tender in lower quadrants distended with gas no fluid wave no rebound no masses palpable Atrophic external genitalia, severe Severe atrophy of the vaginal mucosa No midline or adnexal masses are palpable on bimanual exam  Pertinent ROS Per HPI  Labs or studies I reviewed her recent CT scan as well as recent laboratory data    Impression Diagnoses this Encounter::   ICD-10-CM   1. Ovarian cancer screening  Z12.73 CA 125  2. Diarrhea, unspecified type  R19.7 Clostridium Difficile by PCR([facility])    Established relevant diagnosis(es):    Plan/Recommendations: No orders of the defined types were placed in this encounter.   Labs or Scans Ordered: Orders Placed This Encounter  Procedures  . Clostridium Difficile by PCR([facility])  . CA 125    Management:: We will check a  CA-125 for full evaluation CT scan was normal no evidence of adnexal enlargement ascites or other intraperitoneal abnormalities With her multiple antibiotic regimens in the last couple of months we will do a C. difficile toxin to ensure she does not have pseudomembranous colitis Urine culture will be performed going forward as patient has symptoms  Consider chronic Macrodantin therapy 50 mg nightly if this continues  Follow up No follow-ups on file.     All questions were answered.

## 2019-12-14 LAB — CA 125: Cancer Antigen (CA) 125: 12.6 U/mL (ref 0.0–38.1)

## 2019-12-15 LAB — URINE CULTURE

## 2019-12-16 ENCOUNTER — Encounter: Payer: Medicare Other | Admitting: Physical Medicine and Rehabilitation

## 2019-12-16 DIAGNOSIS — R197 Diarrhea, unspecified: Secondary | ICD-10-CM | POA: Diagnosis not present

## 2019-12-18 LAB — CLOSTRIDIUM DIFFICILE BY PCR: Toxigenic C. Difficile by PCR: NEGATIVE

## 2019-12-23 ENCOUNTER — Telehealth: Payer: Self-pay | Admitting: Obstetrics & Gynecology

## 2019-12-23 NOTE — Telephone Encounter (Signed)
Ok great I appreciate the follow up

## 2019-12-23 NOTE — Telephone Encounter (Signed)
Spoke with Angelique Blonder giving Dr. Forestine Chute recommendations. Pt started with pain and pressure on Friday and was prescribed Cipro Saturday. She has been taking antibiotic since Saturday. Family just wanted you to be aware. JSY

## 2019-12-23 NOTE — Telephone Encounter (Signed)
Left message @ 4:31 pm with Angelique Blonder. JSY

## 2019-12-23 NOTE — Telephone Encounter (Signed)
Patient's daughter called, wanted to let Dr. Despina Hidden know that over the weekend she contacted her PCP & was started on Cipro due to UTI symptoms No new UA done  Also daughter states that Dr. Despina Hidden mentioned at a recent visit a preventative medicine  Please advise    [facility]

## 2019-12-23 NOTE — Telephone Encounter (Signed)
Her urine culture here was negative,  I would hesitate in taking additional antibiotics without the benefit of a positive culture  I also would not start a preventitive medicine if there is a lack of culture information as well

## 2019-12-25 DIAGNOSIS — K5904 Chronic idiopathic constipation: Secondary | ICD-10-CM | POA: Diagnosis not present

## 2019-12-25 DIAGNOSIS — R300 Dysuria: Secondary | ICD-10-CM | POA: Diagnosis not present

## 2019-12-25 DIAGNOSIS — N390 Urinary tract infection, site not specified: Secondary | ICD-10-CM | POA: Diagnosis not present

## 2019-12-26 ENCOUNTER — Emergency Department (HOSPITAL_COMMUNITY): Payer: Medicare Other

## 2019-12-26 ENCOUNTER — Emergency Department (HOSPITAL_COMMUNITY)
Admission: EM | Admit: 2019-12-26 | Discharge: 2019-12-26 | Disposition: A | Discharge status: Home / Self Care | Payer: Medicare Other | Attending: Emergency Medicine | Admitting: Emergency Medicine

## 2019-12-26 ENCOUNTER — Other Ambulatory Visit: Payer: Self-pay

## 2019-12-26 ENCOUNTER — Encounter (HOSPITAL_COMMUNITY): Payer: Self-pay | Admitting: *Deleted

## 2019-12-26 DIAGNOSIS — I10 Essential (primary) hypertension: Secondary | ICD-10-CM | POA: Insufficient documentation

## 2019-12-26 DIAGNOSIS — R1010 Upper abdominal pain, unspecified: Secondary | ICD-10-CM

## 2019-12-26 DIAGNOSIS — Z79899 Other long term (current) drug therapy: Secondary | ICD-10-CM | POA: Insufficient documentation

## 2019-12-26 DIAGNOSIS — R1011 Right upper quadrant pain: Secondary | ICD-10-CM

## 2019-12-26 DIAGNOSIS — K8020 Calculus of gallbladder without cholecystitis without obstruction: Secondary | ICD-10-CM

## 2019-12-26 DIAGNOSIS — Z87891 Personal history of nicotine dependence: Secondary | ICD-10-CM | POA: Diagnosis not present

## 2019-12-26 DIAGNOSIS — E039 Hypothyroidism, unspecified: Secondary | ICD-10-CM | POA: Insufficient documentation

## 2019-12-26 DIAGNOSIS — R109 Unspecified abdominal pain: Secondary | ICD-10-CM | POA: Diagnosis not present

## 2019-12-26 LAB — COMPREHENSIVE METABOLIC PANEL
ALT: 19 U/L (ref 0–44)
AST: 22 U/L (ref 15–41)
Albumin: 4.3 g/dL (ref 3.5–5.0)
Alkaline Phosphatase: 69 U/L (ref 38–126)
Anion gap: 12 (ref 5–15)
BUN: 13 mg/dL (ref 8–23)
CO2: 21 mmol/L — ABNORMAL LOW (ref 22–32)
Calcium: 8.9 mg/dL (ref 8.9–10.3)
Chloride: 101 mmol/L (ref 98–111)
Creatinine, Ser: 0.74 mg/dL (ref 0.44–1.00)
GFR, Estimated: >60 mL/min (ref >60)
Glucose, Bld: 105 mg/dL — ABNORMAL HIGH (ref 70–99)
Potassium: 3.8 mmol/L (ref 3.5–5.1)
Sodium: 134 mmol/L — ABNORMAL LOW (ref 135–145)
Total Bilirubin: 0.6 mg/dL (ref 0.3–1.2)
Total Protein: 7.8 g/dL (ref 6.5–8.1)

## 2019-12-26 LAB — URINALYSIS, ROUTINE W REFLEX MICROSCOPIC
Bilirubin Urine: NEGATIVE
Glucose, UA: NEGATIVE mg/dL
Hgb urine dipstick: NEGATIVE
Ketones, ur: NEGATIVE mg/dL
Leukocytes,Ua: NEGATIVE
Nitrite: NEGATIVE
Protein, ur: NEGATIVE mg/dL
Specific Gravity, Urine: 1.005 (ref 1.005–1.030)
pH: 6 (ref 5.0–8.0)

## 2019-12-26 LAB — DIFFERENTIAL
Abs Immature Granulocytes: 0.02 10*3/uL (ref 0.00–0.07)
Basophils Absolute: 0 10*3/uL (ref 0.0–0.1)
Basophils Relative: 1 %
Eosinophils Absolute: 0 10*3/uL (ref 0.0–0.5)
Eosinophils Relative: 0 %
Immature Granulocytes: 0 %
Lymphocytes Relative: 37 %
Lymphs Abs: 2.4 10*3/uL (ref 0.7–4.0)
Monocytes Absolute: 0.7 10*3/uL (ref 0.1–1.0)
Monocytes Relative: 11 %
Neutro Abs: 3.2 10*3/uL (ref 1.7–7.7)
Neutrophils Relative %: 51 %

## 2019-12-26 LAB — LIPASE, BLOOD: Lipase: 31 U/L (ref 11–51)

## 2019-12-26 LAB — CBC
HCT: 44.6 % (ref 36.0–46.0)
Hemoglobin: 14.8 g/dL (ref 12.0–15.0)
MCH: 32.2 pg (ref 26.0–34.0)
MCHC: 33.2 g/dL (ref 30.0–36.0)
MCV: 97.2 fL (ref 80.0–100.0)
Platelets: 209 10*3/uL (ref 150–400)
RBC: 4.59 MIL/uL (ref 3.87–5.11)
RDW: 11.8 % (ref 11.5–15.5)
WBC: 6.4 10*3/uL (ref 4.0–10.5)
nRBC: 0 % (ref 0.0–0.2)

## 2019-12-26 MED ORDER — ONDANSETRON HCL 4 MG/2ML IJ SOLN: 4.0000 mg | Freq: Once | INTRAMUSCULAR | Status: AC

## 2019-12-26 MED ORDER — ONDANSETRON HCL 4 MG PO TABS: 4.0000 mg | ORAL_TABLET | Freq: Three times a day (TID) | ORAL | 0 refills | Status: DC | PRN

## 2019-12-26 MED ORDER — ONDANSETRON HCL 4 MG/2ML IJ SOLN
INTRAMUSCULAR | Status: AC
  Administered 2019-12-26: 4 mg via INTRAVENOUS
  Filled 2019-12-26: qty 2

## 2019-12-26 MED ORDER — IOHEXOL 300 MG/ML  SOLN
75.0000 mL | Freq: Once | INTRAMUSCULAR | Status: AC | PRN
  Administered 2019-12-26: 75 mL via INTRAVENOUS

## 2019-12-26 NOTE — ED Triage Notes (Signed)
Pt reports being dx with UTI last Saturday and has been on antibiotics. Pt still wasn't feeling any better and saw her doctor again and they did another urine sample and switched antibiotics from Cipro to Bactrim. Pt c/o pain to right side of abdomen that started this morning. Pt also c/o nausea but feels like it may be from taking the Bactrim. Pt reports she has hx of bowel obstructions but reports she is having normal BMs.

## 2019-12-26 NOTE — ED Notes (Signed)
To CT

## 2019-12-26 NOTE — ED Provider Notes (Signed)
[facility] EMERGENCY DEPARTMENT Provider Note   CSN: [id number] Arrival date & time: 12/26/19  1747     History Chief Complaint  Patient presents with  . Abdominal Pain    Erica [name] is a 86 y.o. female with a history of bipolar disorder, hypothyroidism, hyperlipidemia, IBS and rheumatoid arthritis presenting for evaluation of abdominal pain.  She was diagnosed with a UTI and was placed on a course of Cipro, then switched to Bactrim which she started yesterday without improvement in her symptoms including abdominal distention and dysuria.  However she has developed abdominal pain described as a waxing and waning burning pain in her right upper quadrant which radiates into her epigastric region which is currently tolerable, but gets worse with sitting and standing.  She also describes having a history of small bowel obstruction and states her symptoms are similar.  She endorses abdominal distention as well.  She has had nausea without emesis.  She states the nausea started after she took her first Bactrim tablet so believes it is a side effect of this medication.  She has had no fevers or chills.  Her last bowel movement was yesterday, no BM today but states has been passing flatus without improvement in symptoms.  She has had a poor appetite today.  Additionally she denies chest pain or shortness of breath, cough, flank pain.Marland Kitchen  HPI     Past Medical History:  Diagnosis Date  . Anxiety   . Bipolar disorder (HCC)   . Hyperlipidemia   . Hypothyroidism   . Irritable bowel syndrome   . RA (rheumatoid arthritis) [facility])     Patient Active Problem List   Diagnosis Date Noted  . Bloating 01/28/2019  . Boil of groin 12/31/2018  . Constipation in female 06/22/2017  . Fall 05/07/2017  . Right rib fracture 05/07/2017  . AKI (acute kidney injury) (HCC) 05/04/2017  . Hyponatremia 05/03/2017  . Bipolar disorder with severe depression (HCC) 11/23/2016  . Hypertension 11/23/2016  .  Rheumatoid arthritis involving both hands with positive rheumatoid factor (HCC) 06/01/2016  . Partial small bowel obstruction (HCC) 03/12/2015  . Depression 03/12/2015  . Hypotension 03/12/2015  . Lactic acidosis 03/12/2015  . Dehydration 03/12/2015  . Small bowel obstruction (HCC) 03/12/2015  . Hypothyroidism 02/21/2014  . 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 Major depressive disorder, recurrent episode with anxious distress (HCC) 02/13/2014  . Tachycardia 08/24/2013  . S/P arthroscopy of shoulder 08/14/2013  . Muscle weakness (generalized) 06/14/2013  . Rotator cuff syndrome of right shoulder 12/05/2012  . Right shoulder pain 12/05/2012  . Rectal bleeding 06/06/2011  . Epigastric pain 04/19/2011  . GERD (gastroesophageal reflux disease) 04/19/2011  . IBS (irritable bowel syndrome) 04/19/2011  . METATARSALGIA 05/12/2009  . BUNION 05/12/2009    Past Surgical History:  Procedure Laterality Date  . ABDOMINAL HYSTERECTOMY    . Bone Spurs  08/07/13   Right Shoulder  . COLONOSCOPY    . COLONOSCOPY N/A 05/08/2012   Procedure: COLONOSCOPY;  Surgeon: Malissa Hippo, MD;  [facility];  Service: Endoscopy;  Laterality: N/A;  730  . COLOSTOMY    . ESOPHAGOGASTRODUODENOSCOPY N/A 11/04/2013   Procedure: ESOPHAGOGASTRODUODENOSCOPY (EGD);  Surgeon: Malissa Hippo, MD;  [facility];  Service: Endoscopy;  Laterality: N/A;  730  . EYE SURGERY  cataract x 2  . MALONEY DILATION N/A 11/04/2013   Procedure: Elease Hashimoto DILATION;  Surgeon: Malissa Hippo, MD;  [facility];  Service: Endoscopy;  Laterality: N/A;  .  UPPER GASTROINTESTINAL ENDOSCOPY       OB History    Gravida  3   Para  3   Term      Preterm      AB      Living  3     SAB      TAB      Ectopic      Multiple      Live Births              Family History  Problem Relation Age of Onset  . Colon cancer Mother   . Bipolar disorder Mother   . Anxiety disorder Mother   . Atrial  fibrillation Son   . Depression Maternal Grandmother     Social History   Tobacco Use  . Smoking status: Former Smoker    Packs/day: 1.50    Years: 27.00    Pack years: 40.50    Types: Cigarettes    Quit date: 04/18/1981    Years since quitting: 38.7  . Smokeless tobacco: Former Neurosurgeon    Quit date: 11/27/1980  Vaping Use  . Vaping Use: Never used  Substance Use Topics  . Alcohol use: Yes    Alcohol/week: 1.0 standard drink    Types: 1 Glasses of wine per week  . Drug use: No    Home Medications Prior to Admission medications   Medication Sig Start Date End Date Taking? Authorizing Provider  acetaminophen (TYLENOL) 650 MG CR tablet Take 1,300 mg by mouth daily.    [provider]  ALPRAZolam (XANAX PO) Take by mouth. Patient states that she takes 1/3 tablet as needed for anxiety, per patient not often.    [provider]  Cyanocobalamin (VITAMIN B-12 PO) Take by mouth daily.    [provider]  linaclotide (LINZESS) 72 MCG capsule Take 1 capsule (72 mcg total) by mouth daily. Can try every other day if having too many bowel movements 11/07/19 12/07/19  Marguerita Merles, Reuel Boom, MD  losartan (COZAAR) 50 MG tablet Take 75 mg by mouth daily.  07/02/19   [provider]  Multiple Vitamins-Minerals (CENTRUM SILVER 50+WOMEN) TABS Take 1 tablet by mouth.    [provider]  ondansetron (ZOFRAN) 4 MG tablet Take 1 tablet (4 mg total) by mouth every 8 (eight) hours as needed for nausea or vomiting. 12/26/19   Taraneh Metheney, [name], PA-C  pantoprazole (PROTONIX) 40 MG tablet Take 1 tablet (40 mg total) by mouth daily before breakfast. 01/28/19   Rehman, Joline Maxcy, MD  polyethylene glycol powder (GLYCOLAX/MIRALAX) powder 17 gram po qd as directed. 09/20/17   Aliene Beams, MD  selegiline (EMSAM) 6 MG/24HR Place 1 patch (6 mg total) onto the skin daily. Patient taking differently: Place 12 mg onto the skin daily.  12/07/16   Aliene Beams, MD  SYNTHROID 75 MCG  tablet Take 1 tablet (75 mcg total) by mouth daily. 09/20/17   Aliene Beams, MD    Allergies    Penicillins, Hydrocodone, and Morphine and related  Review of Systems   Review of Systems  Constitutional: Negative for chills and fever.  HENT: Negative.   Eyes: Negative.   Respiratory: Negative for chest tightness and shortness of breath.   Cardiovascular: Negative for chest pain.  Gastrointestinal: Positive for abdominal distention, abdominal pain and nausea. Negative for vomiting.  Genitourinary: Positive for dysuria.  Musculoskeletal: Negative for arthralgias, joint swelling and neck pain.  Skin: Negative.  Negative for rash and wound.  Neurological: Negative for  dizziness, weakness, light-headedness, numbness and headaches.  Psychiatric/Behavioral: Negative.   All other systems reviewed and are negative.   Physical Exam Updated Vital Signs BP (!) 159/82   Pulse (!) 107   Temp 98.3 F (36.8 C) (Oral)   Resp 16   Ht 5' (1.524 m)   Wt 53.1 kg   SpO2 95%   BMI 22.85 kg/m   Physical Exam Vitals and nursing note reviewed.  Constitutional:      Appearance: She is well-developed.  HENT:     Head: Normocephalic and atraumatic.  Eyes:     Conjunctiva/sclera: Conjunctivae normal.  Cardiovascular:     Rate and Rhythm: Normal rate and regular rhythm.     Heart sounds: Normal heart sounds.  Pulmonary:     Effort: Pulmonary effort is normal.     Breath sounds: Normal breath sounds. No wheezing.  Abdominal:     General: Bowel sounds are increased. There is distension.     Palpations: Abdomen is soft.     Tenderness: There is abdominal tenderness in the right upper quadrant and epigastric area. There is no guarding or rebound.     Comments: Increased tympany to percussion, abd soft but distended.  No guarding.    Musculoskeletal:        General: Normal range of motion.     Cervical back: Normal range of motion.  Skin:    General: Skin is warm and dry.  Neurological:      Mental Status: She is alert.     ED Results / Procedures / Treatments   Labs (all labs ordered are listed, but only abnormal results are displayed) Labs Reviewed  COMPREHENSIVE METABOLIC PANEL - Abnormal; Notable for the following components:      Result Value   Sodium 134 (*)    CO2 21 (*)    Glucose, Bld 105 (*)    All other components within normal limits  URINALYSIS, ROUTINE W REFLEX MICROSCOPIC - Abnormal; Notable for the following components:   Color, Urine STRAW (*)    All other components within normal limits  LIPASE, BLOOD  CBC  DIFFERENTIAL    EKG None  Radiology CT ABDOMEN PELVIS W CONTRAST  Result Date: 12/26/2019 CLINICAL DATA:  Right-sided abdominal pain and nausea EXAM: CT ABDOMEN AND PELVIS WITH CONTRAST TECHNIQUE: Multidetector CT imaging of the abdomen and pelvis was performed using the standard protocol following bolus administration of intravenous contrast. CONTRAST:  14mL OMNIPAQUE IOHEXOL 300 MG/ML  SOLN COMPARISON:  09/26/2019 FINDINGS: LOWER CHEST: Normal. HEPATOBILIARY: Normal hepatic contours. No intra- or extrahepatic biliary dilatation. There is cholelithiasis without acute inflammation. PANCREAS: Normal pancreas. No ductal dilatation or peripancreatic fluid collection. SPLEEN: Normal. ADRENALS/URINARY TRACT: The adrenal glands are normal. No hydronephrosis, nephroureterolithiasis or solid renal mass. The urinary bladder is normal for degree of distention STOMACH/BOWEL: Small hiatal hernia. No small bowel dilatation or inflammation. Postsurgical changes of the distal descending colon. Appendix is not seen and may be surgically absent. VASCULAR/LYMPHATIC: There is calcific atherosclerosis of the abdominal aorta. No lymphadenopathy. REPRODUCTIVE: Status post hysterectomy. No adnexal mass. MUSCULOSKELETAL. Levoscoliosis with apex at L2 OTHER: None. IMPRESSION: 1. No acute abnormality of the abdomen or pelvis. 2. Aortic Atherosclerosis (ICD10-I70.0).  Electronically Signed   By: Deatra Robinson M.D.   On: 12/26/2019 20:51    Procedures Procedures (including critical care time)  Medications Ordered in ED Medications  ondansetron (ZOFRAN) injection 4 mg (4 mg Intravenous Given 12/26/19 1936)  iohexol (OMNIPAQUE) 300 MG/ML solution 75 mL (  75 mLs Intravenous Contrast Given 12/26/19 2043)    ED Course  I have reviewed the triage vital signs and the nursing notes.  Pertinent labs & imaging results that were available during my care of the patient were reviewed by me and considered in my medical decision making (see chart for details).    MDM Rules/Calculators/A&P                          Pt with RUQ and epigastric abd pain, nausea without emesis, dysuria.  No acute findings per labs and CT imaging.  Had recently undergone gyn eval to rule out ovarian cancer as source of bloating sx.    Currently sx free at time of dc.  She was scheduled for outpt [place] to further assess gallbladder as source of sx, labs negative tonight but there are gallstones per CT.  Recommended f/u with Dr Karilyn Cota for further eval/management of sx.   Pt seen by Dr. Estell Harpin prior to dc home.  Final Clinical Impression(s) / ED Diagnoses Final diagnoses:  Right upper quadrant abdominal pain  Gallstones    Rx / DC Orders ED Discharge Orders         Ordered    US Abdomen Limited RUQ/Gall Gladder        12/26/19 2111    ondansetron (ZOFRAN) 4 MG tablet  Every 8 hours PRN        12/26/19 2114           Burgess Amor, Cordelia Poche 12/26/19 2115    Bethann Berkshire, MD 12/27/19 1057

## 2019-12-26 NOTE — ED Notes (Signed)
Call to CT to ascertain when pt will go to CT  One on table   Call from EDP regarding pt who is trauma needing to go next  Pt informed of delay

## 2019-12-26 NOTE — Discharge Instructions (Signed)
Your work up tonight is negative for any bloodwork or urinary abnormalities (no urinary tract infection).  You do have gallstones in your gallbladder but this does not mean this is the source of your symptoms.  You will need an ultrasound test which has been ordered for you - call the scheduling phone number in the morning to get an appointment time for this.  Plan to call Dr Karilyn Cota for further advice if your symptoms persist.

## 2019-12-30 ENCOUNTER — Ambulatory Visit (HOSPITAL_COMMUNITY)
Admission: RE | Admit: 2019-12-30 | Discharge: 2019-12-30 | Disposition: A | Discharge status: Home / Self Care | Payer: Medicare Other | Source: Ambulatory Visit | Attending: Emergency Medicine | Admitting: Emergency Medicine

## 2019-12-30 ENCOUNTER — Other Ambulatory Visit: Payer: Self-pay

## 2019-12-30 DIAGNOSIS — R110 Nausea: Secondary | ICD-10-CM | POA: Diagnosis not present

## 2019-12-30 DIAGNOSIS — F3341 Major depressive disorder, recurrent, in partial remission: Secondary | ICD-10-CM | POA: Diagnosis not present

## 2019-12-30 DIAGNOSIS — R1010 Upper abdominal pain, unspecified: Secondary | ICD-10-CM

## 2019-12-30 DIAGNOSIS — R1011 Right upper quadrant pain: Secondary | ICD-10-CM | POA: Diagnosis not present

## 2020-01-06 DIAGNOSIS — F3341 Major depressive disorder, recurrent, in partial remission: Secondary | ICD-10-CM | POA: Diagnosis not present

## 2020-01-07 ENCOUNTER — Other Ambulatory Visit: Payer: Self-pay

## 2020-01-07 ENCOUNTER — Ambulatory Visit (INDEPENDENT_AMBULATORY_CARE_PROVIDER_SITE_OTHER): Payer: Medicare Other | Admitting: Internal Medicine

## 2020-01-07 ENCOUNTER — Encounter (INDEPENDENT_AMBULATORY_CARE_PROVIDER_SITE_OTHER): Payer: Self-pay | Admitting: Internal Medicine

## 2020-01-07 VITALS — BP 140/89 | HR 125 | Temp 98.5°F | Ht 62.0 in | Wt 116.5 lb

## 2020-01-07 DIAGNOSIS — N3000 Acute cystitis without hematuria: Secondary | ICD-10-CM | POA: Diagnosis not present

## 2020-01-07 DIAGNOSIS — K580 Irritable bowel syndrome with diarrhea: Secondary | ICD-10-CM

## 2020-01-07 DIAGNOSIS — R110 Nausea: Secondary | ICD-10-CM | POA: Diagnosis not present

## 2020-01-07 DIAGNOSIS — F339 Major depressive disorder, recurrent, unspecified: Secondary | ICD-10-CM | POA: Diagnosis not present

## 2020-01-07 DIAGNOSIS — F419 Anxiety disorder, unspecified: Secondary | ICD-10-CM | POA: Diagnosis not present

## 2020-01-07 DIAGNOSIS — R140 Abdominal distension (gaseous): Secondary | ICD-10-CM | POA: Diagnosis not present

## 2020-01-07 MED ORDER — SIMETHICONE 180 MG PO CAPS: 180.0000 mg | ORAL_CAPSULE | Freq: Three times a day (TID) | ORAL | 0 refills | Status: DC

## 2020-01-07 NOTE — Progress Notes (Signed)
Presenting complaint;  Bloating nausea and diarrhea.  Database and subjective:  Patient is 86 year old Caucasian female who has chronic GERD maintained on PPI as well as history of IBS with diarrhea who had done well for several months. She also has been complaining of bloating for the last several months. She had abdominal pelvic CT in July this year and was unremarkable.  Patient states she was on SAM patch which is MAO inhibitor which was stopped 3 weeks ago. She is now under care of Dr. Casimiro Needle for her depression anxiety. She was begun on Fetzima and she took first dose today. She has been treated with antibiotic for urinary tract infection. She saw Dr. Dennis Bast are on 12/13/2019 and UTI was ruled out. Her CA-125 was normal and she was advised to take probiotic. She began to experience lower abdominal pressure and dysuria on 12/21/2019. She was begun on Cipro by Dr. Renato Battles. 4 days later she was still symptomatic and she was switched to Bactrim on 12/25/2019. She developed nausea and RUQ abdominal pain and seer in ER at [facility]. CBC, CMET and serum lipase were normal. No abnormality noted on CT to account for symptoms. UA was normal. She returned for [place] on 12/30/2019 and study was negative for gallstones. She remains with nausea with vomiting. Ondansetron and alprazolam are  Helping. She also complains of post-prandial diarrhea. She denies rectal bleeding or melena.She took Imodium OTC and did not have BM for days.She tried eliminating gluten from her diet but it did not help. She has not had fever, chills or night sweats. Her apetite is not good .She has lost 4 lbs since 07/30/2019 visit.  Current Medications: Outpatient Encounter Medications as of 01/07/2020  Medication Sig  . acetaminophen (TYLENOL) 650 MG CR tablet Take 1,300 mg by mouth daily.  Marland Kitchen ALPRAZolam (XANAX PO) Take 1 tablet by mouth daily.   . Cyanocobalamin (VITAMIN B-12 PO) Take by mouth daily.  . Levomilnacipran HCl ER (FETZIMA) 20 MG  CP24 Take 20 mg by mouth daily.  Marland Kitchen losartan (COZAAR) 50 MG tablet Take 75 mg by mouth daily.   . Multiple Vitamins-Minerals (CENTRUM SILVER 50+WOMEN) TABS Take 1 tablet by mouth.  . ondansetron (ZOFRAN) 4 MG tablet Take 1 tablet (4 mg total) by mouth every 8 (eight) hours as needed for nausea or vomiting.  . pantoprazole (PROTONIX) 40 MG tablet Take 1 tablet (40 mg total) by mouth daily before breakfast.  . polyethylene glycol powder (GLYCOLAX/MIRALAX) powder 17 gram po qd as directed.  Marland Kitchen SYNTHROID 75 MCG tablet Take 1 tablet (75 mcg total) by mouth daily.  Marland Kitchen linaclotide (LINZESS) 72 MCG capsule Take 1 capsule (72 mcg total) by mouth daily. Can try every other day if having too many bowel movements  . [DISCONTINUED] selegiline (EMSAM) 6 MG/24HR Place 1 patch (6 mg total) onto the skin daily. (Patient not taking: Reported on 01/07/2020)   No facility-administered encounter medications on file as of 01/07/2020.     Objective: Blood pressure 140/89, pulse (!) 125, temperature 98.5 F (36.9 C), temperature source Oral, height '5\' 2"'  (1.575 m), weight 116 lb 8 oz (52.8 kg).  Patient is alert and in no acute distress. She is wearing a mask. Conjunctiva is pink. Sclera is nonicteric Oropharyngeal mucosa is normal. No neck masses or thyromegaly noted. Cardiac exam with regular rhythm normal S1 and S2. No murmur or gallop noted. Lungs are clear to auscultation. Abdomen symmetrical with midline scar.  Bowel sounds are normal.  On palpation is soft  and nontender with no organomegaly or masses.  Percussion is normal. No LE edema or clubbing noted. She has ulnar deviation   Labs/studies Results:   CBC Latest Ref Rng & Units 12/26/2019 06/21/2017 05/05/2017  WBC 4.0 - 10.5 K/uL 6.4 5.3 7.8  Hemoglobin 12.0 - 15.0 g/dL 14.8 13.7 11.8(L)  Hematocrit 36 - 46 % 44.6 39.9 38.0  Platelets 150 - 400 K/uL 209 216 170    CMP Latest Ref Rng & Units 12/26/2019 09/26/2019 06/21/2017  Glucose 70 - 99 mg/dL  105(H) - 90  BUN 8 - 23 mg/dL 13 - 17  Creatinine 0.44 - 1.00 mg/dL 0.74 0.80 0.69  Sodium 135 - 145 mmol/L 134(L) - 138  Potassium 3.5 - 5.1 mmol/L 3.8 - 4.0  Chloride 98 - 111 mmol/L 101 - 103  CO2 22 - 32 mmol/L 21(L) - 27  Calcium 8.9 - 10.3 mg/dL 8.9 - 9.3  Total Protein 6.5 - 8.1 g/dL 7.8 - -  Total Bilirubin 0.3 - 1.2 mg/dL 0.6 - -  Alkaline Phos 38 - 126 U/L 69 - -  AST 15 - 41 U/L 22 - -  ALT 0 - 44 U/L 19 - -    Hepatic Function Latest Ref Rng & Units 12/26/2019 05/04/2017 05/03/2017  Total Protein 6.5 - 8.1 g/dL 7.8 6.5 8.4(H)  Albumin 3.5 - 5.0 g/dL 4.3 3.4(L) 4.4  AST 15 - 41 U/L '22 19 24  ' ALT 0 - 44 U/L '19 15 20  ' Alk Phosphatase 38 - 126 U/L 69 63 84  Total Bilirubin 0.3 - 1.2 mg/dL 0.6 0.5 0.7    No results found for: CRP    Assessment:  #1.  Bloating.  Patient's abdominal examination is benign.  Bloating is part and parcel of IBS or she may have excess flatus.  Change in abdominal girth may also be due to loss of her height.  She does not have alarm symptoms.  Will try Phazyme and see if it helps.  #2.  Nausea.  I suspect her nausea is secondary to poorly controlled anxiety and depression.  She is getting some relief with alprazolam and ondansetron.  Recent CT and ultrasound were unremarkable.  #3.  Right upper quadrant abdominal pain.  She only had one episode for which she was evaluated in the emergency room on 12/26/2019 and her LFTs, CBC and serum lipase were normal.  This pain may be due to IBS although location is not typical.  If this pain recurs will proceed with HIDA scan with CCK.  #4.  Diarrhea.  She is having postprandial bowel movements.  She has a history of IBS but lately has done very well.  She has been on few courses of antibiotics for urinary tract infection but I doubt that she has C. difficile colitis based on her symptoms.  Furthermore C. difficile testing was negative over 2 weeks ago.  She took single dose of loperamide and did not have a bowel  movement for 4 days.  I believe diarrhea would improve with control of her anxiety and depression.  She took first dose of Fetzima today and hopefully this medication will help.  #5.  Chronic GERD.  She is back on pantoprazole 40 mg daily.  She previously had been on every other day schedule.  She will stay on it daily until all of her acute symptoms have resolved.    Plan:  Phazyme 180 mg by mouth after each meal or 3 times a day for 1 week  and thereafter on as-needed basis if it works.  If it does not she can simply stop this medication. Imodium/loperamide OTC 1 mg p.o. daily as needed. Patient will call if she has another episode of right upper quadrant pain in which case we will proceed with HIDA scan with CCK and if it is negative we will do an EGD. Office visit in 2 months.

## 2020-01-07 NOTE — Patient Instructions (Signed)
Can stop Phazyme for 1 week if it does not help with bloating. Take Imodium/loperamide OTC 1 mg daily on as-needed basis Notify if you have another episode of epigastric or right upper quadrant abdominal pain.

## 2020-01-14 DIAGNOSIS — F3341 Major depressive disorder, recurrent, in partial remission: Secondary | ICD-10-CM | POA: Diagnosis not present

## 2020-01-16 DIAGNOSIS — F3341 Major depressive disorder, recurrent, in partial remission: Secondary | ICD-10-CM | POA: Diagnosis not present

## 2020-01-20 ENCOUNTER — Telehealth: Payer: Self-pay

## 2020-01-20 NOTE — Telephone Encounter (Signed)
Patient called in wanting to cancel appt on 11/15. Says she will call back to re sch

## 2020-01-20 NOTE — Telephone Encounter (Signed)
Appointment cancelled

## 2020-01-27 ENCOUNTER — Encounter: Payer: Medicare Other | Admitting: Physical Medicine and Rehabilitation

## 2020-01-27 DIAGNOSIS — Z23 Encounter for immunization: Secondary | ICD-10-CM | POA: Diagnosis not present

## 2020-01-27 DIAGNOSIS — F339 Major depressive disorder, recurrent, unspecified: Secondary | ICD-10-CM | POA: Diagnosis not present

## 2020-01-27 DIAGNOSIS — I10 Essential (primary) hypertension: Secondary | ICD-10-CM | POA: Diagnosis not present

## 2020-01-28 DIAGNOSIS — F3341 Major depressive disorder, recurrent, in partial remission: Secondary | ICD-10-CM | POA: Diagnosis not present

## 2020-02-04 ENCOUNTER — Ambulatory Visit (INDEPENDENT_AMBULATORY_CARE_PROVIDER_SITE_OTHER): Payer: Medicare Other | Admitting: Internal Medicine

## 2020-02-11 DIAGNOSIS — F322 Major depressive disorder, single episode, severe without psychotic features: Secondary | ICD-10-CM | POA: Diagnosis not present

## 2020-02-12 DIAGNOSIS — E559 Vitamin D deficiency, unspecified: Secondary | ICD-10-CM | POA: Diagnosis not present

## 2020-02-12 DIAGNOSIS — F329 Major depressive disorder, single episode, unspecified: Secondary | ICD-10-CM | POA: Diagnosis not present

## 2020-02-12 DIAGNOSIS — H04129 Dry eye syndrome of unspecified lacrimal gland: Secondary | ICD-10-CM | POA: Diagnosis not present

## 2020-02-12 DIAGNOSIS — Z20822 Contact with and (suspected) exposure to covid-19: Secondary | ICD-10-CM | POA: Diagnosis not present

## 2020-02-12 DIAGNOSIS — H04123 Dry eye syndrome of bilateral lacrimal glands: Secondary | ICD-10-CM | POA: Diagnosis not present

## 2020-02-12 DIAGNOSIS — R45851 Suicidal ideations: Secondary | ICD-10-CM | POA: Diagnosis not present

## 2020-02-12 DIAGNOSIS — Z880 Allergy status to penicillin: Secondary | ICD-10-CM | POA: Diagnosis not present

## 2020-02-12 DIAGNOSIS — Z885 Allergy status to narcotic agent status: Secondary | ICD-10-CM | POA: Diagnosis not present

## 2020-02-12 DIAGNOSIS — F411 Generalized anxiety disorder: Secondary | ICD-10-CM | POA: Diagnosis not present

## 2020-02-12 DIAGNOSIS — F419 Anxiety disorder, unspecified: Secondary | ICD-10-CM | POA: Diagnosis not present

## 2020-02-12 DIAGNOSIS — F332 Major depressive disorder, recurrent severe without psychotic features: Secondary | ICD-10-CM | POA: Diagnosis not present

## 2020-02-12 DIAGNOSIS — Z79899 Other long term (current) drug therapy: Secondary | ICD-10-CM | POA: Diagnosis not present

## 2020-02-12 DIAGNOSIS — M1990 Unspecified osteoarthritis, unspecified site: Secondary | ICD-10-CM | POA: Diagnosis not present

## 2020-02-12 DIAGNOSIS — Z87891 Personal history of nicotine dependence: Secondary | ICD-10-CM | POA: Diagnosis not present

## 2020-02-12 DIAGNOSIS — I10 Essential (primary) hypertension: Secondary | ICD-10-CM | POA: Diagnosis not present

## 2020-02-12 DIAGNOSIS — R000 Tachycardia, unspecified: Secondary | ICD-10-CM | POA: Diagnosis not present

## 2020-02-12 DIAGNOSIS — K219 Gastro-esophageal reflux disease without esophagitis: Secondary | ICD-10-CM | POA: Diagnosis not present

## 2020-02-12 DIAGNOSIS — E039 Hypothyroidism, unspecified: Secondary | ICD-10-CM | POA: Diagnosis not present

## 2020-02-13 DIAGNOSIS — F419 Anxiety disorder, unspecified: Secondary | ICD-10-CM | POA: Diagnosis not present

## 2020-02-13 DIAGNOSIS — F332 Major depressive disorder, recurrent severe without psychotic features: Secondary | ICD-10-CM | POA: Diagnosis not present

## 2020-03-03 DIAGNOSIS — F3341 Major depressive disorder, recurrent, in partial remission: Secondary | ICD-10-CM | POA: Diagnosis not present

## 2020-03-11 ENCOUNTER — Ambulatory Visit (INDEPENDENT_AMBULATORY_CARE_PROVIDER_SITE_OTHER): Payer: Medicare Other | Admitting: Internal Medicine

## 2020-03-11 ENCOUNTER — Telehealth: Payer: Self-pay | Admitting: Physical Medicine and Rehabilitation

## 2020-03-11 DIAGNOSIS — Z9289 Personal history of other medical treatment: Secondary | ICD-10-CM | POA: Diagnosis not present

## 2020-03-11 DIAGNOSIS — F3341 Major depressive disorder, recurrent, in partial remission: Secondary | ICD-10-CM | POA: Diagnosis not present

## 2020-03-11 DIAGNOSIS — E039 Hypothyroidism, unspecified: Secondary | ICD-10-CM | POA: Diagnosis not present

## 2020-03-11 DIAGNOSIS — I10 Essential (primary) hypertension: Secondary | ICD-10-CM | POA: Diagnosis not present

## 2020-03-11 NOTE — Telephone Encounter (Signed)
Left message #1 to reschedule L4-5 vs L5-S1 IL.

## 2020-03-11 NOTE — Telephone Encounter (Signed)
Patient called requesting to set app to see Dr. Alvester [name] about her back. Please call patient about this matter at [phone].

## 2020-03-12 ENCOUNTER — Telehealth: Payer: Self-pay | Admitting: Physical Medicine and Rehabilitation

## 2020-03-12 NOTE — Telephone Encounter (Signed)
Patient called. She missed a call from [place] to schedule with Dr. Alvester Morin. Her call back number is [phone]

## 2020-03-12 NOTE — Telephone Encounter (Signed)
See previous message

## 2020-03-12 NOTE — Telephone Encounter (Signed)
Patient wanted an appointment to discuss injection first. Scheduled for OV on 1/25.

## 2020-03-20 DIAGNOSIS — F3341 Major depressive disorder, recurrent, in partial remission: Secondary | ICD-10-CM | POA: Diagnosis not present

## 2020-03-23 ENCOUNTER — Other Ambulatory Visit (HOSPITAL_COMMUNITY): Payer: Self-pay | Admitting: Family Medicine

## 2020-03-23 DIAGNOSIS — Z1231 Encounter for screening mammogram for malignant neoplasm of breast: Secondary | ICD-10-CM

## 2020-03-27 DIAGNOSIS — N390 Urinary tract infection, site not specified: Secondary | ICD-10-CM | POA: Diagnosis not present

## 2020-03-27 DIAGNOSIS — F3341 Major depressive disorder, recurrent, in partial remission: Secondary | ICD-10-CM | POA: Diagnosis not present

## 2020-03-27 DIAGNOSIS — R32 Unspecified urinary incontinence: Secondary | ICD-10-CM | POA: Diagnosis not present

## 2020-04-01 ENCOUNTER — Telehealth: Payer: Self-pay | Admitting: Physical Medicine and Rehabilitation

## 2020-04-01 ENCOUNTER — Telehealth (INDEPENDENT_AMBULATORY_CARE_PROVIDER_SITE_OTHER): Payer: Self-pay | Admitting: Internal Medicine

## 2020-04-01 ENCOUNTER — Other Ambulatory Visit (INDEPENDENT_AMBULATORY_CARE_PROVIDER_SITE_OTHER): Payer: Self-pay

## 2020-04-01 DIAGNOSIS — R110 Nausea: Secondary | ICD-10-CM

## 2020-04-01 DIAGNOSIS — K219 Gastro-esophageal reflux disease without esophagitis: Secondary | ICD-10-CM

## 2020-04-01 DIAGNOSIS — K580 Irritable bowel syndrome with diarrhea: Secondary | ICD-10-CM

## 2020-04-01 DIAGNOSIS — R140 Abdominal distension (gaseous): Secondary | ICD-10-CM

## 2020-04-01 MED ORDER — PANTOPRAZOLE SODIUM 40 MG PO TBEC: 40.0000 mg | DELAYED_RELEASE_TABLET | Freq: Every day | ORAL | 3 refills | Status: DC

## 2020-04-01 NOTE — Telephone Encounter (Signed)
Patient left voice mail message stating she has been trying to get her prescription for pantoprazole since last Thursday - states she is completely out - please advise - ph# [phone]

## 2020-04-01 NOTE — Telephone Encounter (Signed)
Patient aware of all.

## 2020-04-01 NOTE — Telephone Encounter (Signed)
Pt called stating she needs to reschedule her appt currently set for 04/07/20.  [phone]

## 2020-04-01 NOTE — Telephone Encounter (Signed)
Rescheduled

## 2020-04-01 NOTE — Telephone Encounter (Signed)
Medication sent in for the patient to [facility]. Tried calling the patient number busy.

## 2020-04-06 ENCOUNTER — Encounter: Payer: Self-pay | Admitting: Obstetrics & Gynecology

## 2020-04-06 ENCOUNTER — Other Ambulatory Visit: Payer: Self-pay

## 2020-04-06 ENCOUNTER — Other Ambulatory Visit: Payer: Self-pay | Admitting: *Deleted

## 2020-04-06 ENCOUNTER — Ambulatory Visit (INDEPENDENT_AMBULATORY_CARE_PROVIDER_SITE_OTHER): Payer: Medicare Other | Admitting: Obstetrics & Gynecology

## 2020-04-06 VITALS — BP 140/79 | HR 103 | Ht 60.0 in | Wt 119.0 lb

## 2020-04-06 DIAGNOSIS — N390 Urinary tract infection, site not specified: Secondary | ICD-10-CM

## 2020-04-06 DIAGNOSIS — B373 Candidiasis of vulva and vagina: Secondary | ICD-10-CM | POA: Diagnosis not present

## 2020-04-06 DIAGNOSIS — Z1389 Encounter for screening for other disorder: Secondary | ICD-10-CM

## 2020-04-06 DIAGNOSIS — B3731 Acute candidiasis of vulva and vagina: Secondary | ICD-10-CM

## 2020-04-06 MED ORDER — FLUCONAZOLE 50 MG PO TABS: 50.0000 mg | ORAL_TABLET | Freq: Every day | ORAL | 0 refills | Status: DC

## 2020-04-06 MED ORDER — TERCONAZOLE 0.4 % VA CREA: 1.0000 | TOPICAL_CREAM | Freq: Every day | VAGINAL | 0 refills | Status: DC

## 2020-04-06 NOTE — Progress Notes (Signed)
Chief Complaint  Patient presents with  . Vaginal Pain    Pain & burning (vaginal vs urinary)      87 y.o. G3P3 No LMP recorded. Patient has had a hysterectomy. The current method of family planning is hysterectomy.  Outpatient Encounter Medications as of 04/06/2020  Medication Sig Note  . acetaminophen (TYLENOL) 650 MG CR tablet Take 1,300 mg by mouth daily. 01/28/2019: As needed  . ALPRAZolam (XANAX PO) Take 1 tablet by mouth daily.  01/07/2020: One daily.  . Cholecalciferol 25 MCG (1000 UT) tablet Take by mouth.   . ciprofloxacin (CIPRO) 250 MG tablet 1 tablet   . Cyanocobalamin (VITAMIN B-12 PO) Take by mouth daily.   . fluconazole (DIFLUCAN) 50 MG tablet Take 1 tablet (50 mg total) by mouth daily.   . Levomilnacipran HCl ER (FETZIMA) 20 MG CP24 Take 20 mg by mouth daily.   Marland Kitchen LORazepam (ATIVAN) 0.5 MG tablet 1 tablet   . losartan (COZAAR) 50 MG tablet Take 75 mg by mouth daily.    . metoprolol succinate (TOPROL-XL) 25 MG 24 hr tablet Take 25 mg by mouth daily.   . Multiple Vitamins-Minerals (CENTRUM SILVER 50+WOMEN) TABS Take 1 tablet by mouth.   . ondansetron (ZOFRAN) 4 MG tablet Take 1 tablet (4 mg total) by mouth every 8 (eight) hours as needed for nausea or vomiting.   . pantoprazole (PROTONIX) 40 MG tablet Take 1 tablet (40 mg total) by mouth daily before breakfast.   . phenelzine (NARDIL) 15 MG tablet 1 tablet   . polyethylene glycol powder (GLYCOLAX/MIRALAX) powder 17 gram po qd as directed. 01/28/2019: Takes as needed.  . polyvinyl alcohol (LIQUIFILM TEARS) 1.4 % ophthalmic solution Place one drop into both eyes 4 (four) times a day as needed for up to 30 days.   . Probiotic, Lactobacillus, CAPS Take 1 capsule by mouth daily.   . Simethicone (PHAZYME ULTRA STRENGTH) 180 MG CAPS Take 1 capsule (180 mg total) by mouth 3 (three) times daily after meals.   Marland Kitchen SYNTHROID 75 MCG tablet Take 1 tablet (75 mcg total) by mouth daily.   Marland Kitchen terconazole (TERAZOL 7) 0.4 % vaginal  cream Place 1 applicator vaginally at bedtime.   . [DISCONTINUED] linaclotide (LINZESS) 72 MCG capsule Take 1 capsule (72 mcg total) by mouth daily. Can try every other day if having too many bowel movements    No facility-administered encounter medications on file as of 04/06/2020.    Subjective Pt with vv burning s/p several courses of antibiotics the last few months No discharge On cipro  Past Medical History:  Diagnosis Date  . Anxiety   . Bipolar disorder (HCC)   . Hyperlipidemia   . Hypothyroidism   . Irritable bowel syndrome   . RA (rheumatoid arthritis) (HCC)     Past Surgical History:  Procedure Laterality Date  . ABDOMINAL HYSTERECTOMY    . Bone Spurs  08/07/13   Right Shoulder  . COLONOSCOPY    . COLONOSCOPY N/A 05/08/2012   Procedure: COLONOSCOPY;  Surgeon: Malissa Hippo, MD;  Location: [facility];  Service: Endoscopy;  Laterality: N/A;  730  . COLOSTOMY    . ESOPHAGOGASTRODUODENOSCOPY N/A 11/04/2013   Procedure: ESOPHAGOGASTRODUODENOSCOPY (EGD);  Surgeon: Malissa Hippo, MD;  Location: [facility];  Service: Endoscopy;  Laterality: N/A;  730  . EYE SURGERY  cataract x 2  . MALONEY DILATION N/A 11/04/2013   Procedure: Elease Hashimoto DILATION;  Surgeon: Malissa Hippo, MD;  Location: [facility];  Service: Endoscopy;  Laterality: N/A;  . UPPER GASTROINTESTINAL ENDOSCOPY      OB History    Gravida  3   Para  3   Term      Preterm      AB      Living  3     SAB      IAB      Ectopic      Multiple      Live Births              Allergies  Allergen Reactions  . Penicillins Itching and Rash  . Hydrocodone     MAO-I ; cannot take medications.   . Morphine And Related     Unable to take due to MAO-I  . Sulfamethoxazole-Trimethoprim Nausea Only    Social History   Socioeconomic History  . Marital status: Widowed    Spouse name: Not on file  . Number of children: Not on file  . Years of education: Not on file  . Highest education  level: Not on file  Occupational History  . Not on file  Tobacco Use  . Smoking status: Former Smoker    Packs/day: 1.50    Years: 27.00    Pack years: 40.50    Types: Cigarettes    Quit date: 04/18/1981    Years since quitting: 38.9  . Smokeless tobacco: Former Neurosurgeon    Quit date: 11/27/1980  Vaping Use  . Vaping Use: Never used  Substance and Sexual Activity  . Alcohol use: Yes    Alcohol/week: 1.0 standard drink    Types: 1 Glasses of wine per week  . Drug use: No  . Sexual activity: Not Currently    Partners: Male    Birth control/protection: Post-menopausal, Surgical    Comment: hyst  Other Topics Concern  . Not on file  Social History Narrative  . Not on file   Social Determinants of Health   Financial Resource Strain: Not on file  Food Insecurity: Not on file  Transportation Needs: Not on file  Physical Activity: Not on file  Stress: Not on file  Social Connections: Not on file    Family History  Problem Relation Age of Onset  . Colon cancer Mother   . Bipolar disorder Mother   . Anxiety disorder Mother   . Atrial fibrillation Son   . Depression Maternal Grandmother     Medications:       Current Outpatient Medications:  .  acetaminophen (TYLENOL) 650 MG CR tablet, Take 1,300 mg by mouth daily., Disp: , Rfl:  .  ALPRAZolam (XANAX PO), Take 1 tablet by mouth daily. , Disp: , Rfl:  .  Cholecalciferol 25 MCG (1000 UT) tablet, Take by mouth., Disp: , Rfl:  .  ciprofloxacin (CIPRO) 250 MG tablet, 1 tablet, Disp: , Rfl:  .  Cyanocobalamin (VITAMIN B-12 PO), Take by mouth daily., Disp: , Rfl:  .  fluconazole (DIFLUCAN) 50 MG tablet, Take 1 tablet (50 mg total) by mouth daily., Disp: 14 tablet, Rfl: 0 .  Levomilnacipran HCl ER (FETZIMA) 20 MG CP24, Take 20 mg by mouth daily., Disp: , Rfl:  .  LORazepam (ATIVAN) 0.5 MG tablet, 1 tablet, Disp: , Rfl:  .  losartan (COZAAR) 50 MG tablet, Take 75 mg by mouth daily. , Disp: , Rfl:  .  metoprolol succinate (TOPROL-XL)  25 MG 24 hr tablet, Take 25 mg by mouth daily., Disp: , Rfl:  .  Multiple Vitamins-Minerals (CENTRUM SILVER 50+WOMEN) TABS, Take 1 tablet by mouth., Disp: , Rfl:  .  ondansetron (ZOFRAN) 4 MG tablet, Take 1 tablet (4 mg total) by mouth every 8 (eight) hours as needed for nausea or vomiting., Disp: 20 tablet, Rfl: 0 .  pantoprazole (PROTONIX) 40 MG tablet, Take 1 tablet (40 mg total) by mouth daily before breakfast., Disp: 90 tablet, Rfl: 3 .  phenelzine (NARDIL) 15 MG tablet, 1 tablet, Disp: , Rfl:  .  polyethylene glycol powder (GLYCOLAX/MIRALAX) powder, 17 gram po qd as directed., Disp: 1530 g, Rfl: 1 .  polyvinyl alcohol (LIQUIFILM TEARS) 1.4 % ophthalmic solution, Place one drop into both eyes 4 (four) times a day as needed for up to 30 days., Disp: , Rfl:  .  Probiotic, Lactobacillus, CAPS, Take 1 capsule by mouth daily., Disp: , Rfl:  .  Simethicone (PHAZYME ULTRA STRENGTH) 180 MG CAPS, Take 1 capsule (180 mg total) by mouth 3 (three) times daily after meals., Disp: , Rfl: 0 .  SYNTHROID 75 MCG tablet, Take 1 tablet (75 mcg total) by mouth daily., Disp: 90 tablet, Rfl: 1 .  terconazole (TERAZOL 7) 0.4 % vaginal cream, Place 1 applicator vaginally at bedtime., Disp: 45 g, Rfl: 0  Objective Blood pressure 140/79, pulse (!) 103, height 5' (1.524 m), weight 119 lb (54 kg).  General WDWN female NAD Vulva:  normal appearing vulva with no masses, tenderness or lesions Vagina:  normal mucosa, no discharge Atrophic erythema consistent with VV yeast GV painting done Cath urine culture sent    Pertinent ROS  No nausea, vomiting or diarrhea Nor fever chills or other constitutional symptoms   Labs or studies pending    Impression Diagnoses this Encounter::   ICD-10-CM   1. Candidal vulvovaginitis  B37.3   2. Screening for genitourinary condition  Z13.89 Urine Culture  3. Recurrent UTI  N39.0     Established relevant diagnosis(es):   Plan/Recommendations: Meds ordered this  encounter  Medications  . fluconazole (DIFLUCAN) 50 MG tablet    Sig: Take 1 tablet (50 mg total) by mouth daily.    Dispense:  14 tablet    Refill:  0  . terconazole (TERAZOL 7) 0.4 % vaginal cream    Sig: Place 1 applicator vaginally at bedtime.    Dispense:  45 g    Refill:  0    Labs or Scans Ordered: Orders Placed This Encounter  Procedures  . Urine Culture    Management:: GV used today Cath urine sent for culture  Follow up Return in about 3 weeks (around 04/27/2020) for Follow up, with Dr Despina Hidden.       All questions were answered.

## 2020-04-07 ENCOUNTER — Ambulatory Visit: Payer: Medicare Other | Admitting: Physical Medicine and Rehabilitation

## 2020-04-07 ENCOUNTER — Ambulatory Visit (INDEPENDENT_AMBULATORY_CARE_PROVIDER_SITE_OTHER): Payer: Medicare Other | Admitting: Internal Medicine

## 2020-04-07 DIAGNOSIS — F3341 Major depressive disorder, recurrent, in partial remission: Secondary | ICD-10-CM | POA: Diagnosis not present

## 2020-04-08 ENCOUNTER — Telehealth: Payer: Self-pay

## 2020-04-08 ENCOUNTER — Encounter (INDEPENDENT_AMBULATORY_CARE_PROVIDER_SITE_OTHER): Payer: Self-pay | Admitting: Internal Medicine

## 2020-04-08 ENCOUNTER — Telehealth (INDEPENDENT_AMBULATORY_CARE_PROVIDER_SITE_OTHER): Payer: Medicare Other | Admitting: Internal Medicine

## 2020-04-08 ENCOUNTER — Ambulatory Visit (HOSPITAL_COMMUNITY): Payer: Medicare Other

## 2020-04-08 ENCOUNTER — Other Ambulatory Visit: Payer: Self-pay

## 2020-04-08 VITALS — Ht 60.0 in | Wt 119.0 lb

## 2020-04-08 DIAGNOSIS — K582 Mixed irritable bowel syndrome: Secondary | ICD-10-CM | POA: Diagnosis not present

## 2020-04-08 DIAGNOSIS — K219 Gastro-esophageal reflux disease without esophagitis: Secondary | ICD-10-CM

## 2020-04-08 NOTE — Telephone Encounter (Signed)
Patient called stating she was in Monday for a UTI. Dr.Eure prescribed her Terconazole vaginal cream. She is unable to use it and has stopped it because it makes her nauseous and causes diarrhea. She wants him to be aware.

## 2020-04-08 NOTE — Telephone Encounter (Signed)
After forwarding pt's msg to Dr. Despina Hidden, he replied with a msg to have the pt take OTC monistat cream. Called pt to give her the info and she stated that the terconazole cream was not the problem and that it was working well. The oral fluconazole was causing the nausea and diarrhea. Pt stated that this happened before when she took it. Instructed pt to try to take the med after eating at a time when she was not taking any other meds. She was eating lunch and stated that she would give it a try. Pt will update the office on the outcome.

## 2020-04-08 NOTE — Telephone Encounter (Signed)
OK that makes way more sense  I f she can't take it there is no equivalent  Just use the terazol and that with the gentian violet hopefully will be successful

## 2020-04-08 NOTE — Telephone Encounter (Signed)
OK tell her to try OTC monistat cream then and see if that does not cause reaction

## 2020-04-08 NOTE — Progress Notes (Signed)
Virtual Visit via Telephone Note  I connected with Erica [name] on 04/08/20 at  4:25 PM EST by telephone and verified that I am speaking with the correct person using two identifiers.  Location: Patient: home Provider: office   I discussed the limitations, risks, security and privacy concerns of performing an evaluation and management service by telephone and the availability of in person appointments. I also discussed with the patient that there may be a patient responsible charge related to this service. The patient expressed understanding and agreed to proceed.   History of Present Illness:  Patient is [name] who is here for scheduled visit.  She was last seen on 01/07/2020. She has history of erosive reflux esophagitis esophageal web as well as history of IBS.  She is high risk for colon carcinoma because of family history and her last colonoscopy was in February 2014 and she opted not to have any more exams.  She says she is doing well as well as other GERD symptoms are concerned.  She feels pantoprazole works very well.  She rarely has heartburn.  She also denies dysphagia.  Her appetite is has been fair.  She has not lost any weight since her last visit.  She is still struggling with urinary tract infection.  She was seen by Dr. Despina Hidden 2 days ago.  She felt to have Candida vulvovaginitis and begun on topical as well as fluconazole.  She also had cath urine specimen for cultures.  She states she took Cipro for 10 days last dose was yesterday.  She was noticing nausea with fluconazole because she was taking on empty stomach.  She continues to complain of bloating.  She feels bloating is due to the fact that she has lost 4 inches of height over the years.  She is using simethicone on as-needed basis but not sure if it helps. She complains of postprandial diarrhea.  She had 2 or 3 bowel movements yesterday.  She denies fever melena or rectal bleeding.  She is not taking  polyethylene glycol for now.  She says she is not taking probiotic which was recommended to her by Dr. Despina Hidden.   Current Outpatient Medications:  .  acetaminophen (TYLENOL) 650 MG CR tablet, Take 1,300 mg by mouth daily., Disp: , Rfl:  .  Cholecalciferol 25 MCG (1000 UT) tablet, Take by mouth daily., Disp: , Rfl:  .  Cyanocobalamin (VITAMIN B-12 PO), Take by mouth daily., Disp: , Rfl:  .  fluconazole (DIFLUCAN) 50 MG tablet, Take 1 tablet (50 mg total) by mouth daily., Disp: 14 tablet, Rfl: 0 .  LORazepam (ATIVAN) 0.5 MG tablet, 1 tablet, Disp: , Rfl:  .  losartan (COZAAR) 50 MG tablet, Take 75 mg by mouth daily. , Disp: , Rfl:  .  metoprolol succinate (TOPROL-XL) 25 MG 24 hr tablet, Take 25 mg by mouth daily., Disp: , Rfl:  .  Multiple Vitamins-Minerals (CENTRUM SILVER 50+WOMEN) TABS, Take 1 tablet by mouth., Disp: , Rfl:  .  pantoprazole (PROTONIX) 40 MG tablet, Take 1 tablet (40 mg total) by mouth daily before breakfast., Disp: 90 tablet, Rfl: 3 .  phenelzine (NARDIL) 15 MG tablet, Take 15 mg by mouth 2 (two) times daily., Disp: , Rfl:  .  polyethylene glycol powder (GLYCOLAX/MIRALAX) powder, 17 gram po qd as directed., Disp: 1530 g, Rfl: 1 .  polyvinyl alcohol (LIQUIFILM TEARS) 1.4 % ophthalmic solution, Place one drop into both eyes 4 (four) times a day as needed for up  to 30 days., Disp: , Rfl:  .  Probiotic, Lactobacillus, CAPS, Take 1 capsule by mouth daily., Disp: , Rfl:  .  Simethicone (PHAZYME ULTRA STRENGTH) 180 MG CAPS, Take 1 capsule (180 mg total) by mouth 3 (three) times daily after meals., Disp: , Rfl: 0 .  SYNTHROID 75 MCG tablet, Take 1 tablet (75 mcg total) by mouth daily., Disp: 90 tablet, Rfl: 1 .  terconazole (TERAZOL 7) 0.4 % vaginal cream, Place 1 applicator vaginally at bedtime., Disp: 45 g, Rfl: 0 .  ondansetron (ZOFRAN) 4 MG tablet, Take 1 tablet (4 mg total) by mouth every 8 (eight) hours as needed for nausea or vomiting. (Patient not taking: Reported on 04/08/2020),  Disp: 20 tablet, Rfl: 0   Medication list reviewed with patient and updated.    Observations/Objective:  Her weight was 119 pounds 2 days ago by Dr. Forestine Chute office. She weighed 816 pounds on her last office visit of 01/07/2020.   Assessment and Plan:  #1.  Chronic GERD.  Heartburn is well controlled with therapy.  Would consider dropping PPI dose when all other acute issues have resolved.  #2.  Irritable bowel syndrome.  Patient has history of diarrhea for years and then she flipped to constipation.  Now she is having diarrhea which is predominantly postprandial with urgency most likely due to antibiotics.  She is not febrile.  Cipro was stopped yesterday.  Patient advised to take probiotic which was recommended by Dr. Despina Hidden. Patient advised to call office of diarrhea persists and or she has fever in which case will do stool testing to rule out C. difficile.  Follow Up Instructions:  Patient advised to call office if she develops fever or diarrhea persists. Office visit in 3 months.  I discussed the assessment and treatment plan with the patient. The patient was provided an opportunity to ask questions and all were answered. The patient agreed with the plan and demonstrated an understanding of the instructions.   The patient was advised to call back or seek an in-person evaluation if the symptoms worsen or if the condition fails to improve as anticipated.  I provided 16 minutes of non-face-to-face time during this encounter.   Lionel December, MD

## 2020-04-09 DIAGNOSIS — F3341 Major depressive disorder, recurrent, in partial remission: Secondary | ICD-10-CM | POA: Diagnosis not present

## 2020-04-09 LAB — URINE CULTURE

## 2020-04-10 ENCOUNTER — Telehealth: Payer: Self-pay

## 2020-04-10 ENCOUNTER — Other Ambulatory Visit: Payer: Self-pay | Admitting: Obstetrics & Gynecology

## 2020-04-10 MED ORDER — FOSFOMYCIN TROMETHAMINE 3 G PO PACK: PACK | ORAL | 1 refills | Status: DC

## 2020-04-10 NOTE — Telephone Encounter (Signed)
Fill it, she will pay it, there is no alternative at all trust me

## 2020-04-10 NOTE — Telephone Encounter (Signed)
Beth at [facility] called stating the new prescription that Dr.Eure called in for the patient is not covered by insurance and is a couple hundred dollars. Please advise

## 2020-04-11 DIAGNOSIS — S82891A Other fracture of right lower leg, initial encounter for closed fracture: Secondary | ICD-10-CM | POA: Diagnosis not present

## 2020-04-11 DIAGNOSIS — S8261XA Displaced fracture of lateral malleolus of right fibula, initial encounter for closed fracture: Secondary | ICD-10-CM | POA: Diagnosis not present

## 2020-04-11 DIAGNOSIS — X58XXXA Exposure to other specified factors, initial encounter: Secondary | ICD-10-CM | POA: Diagnosis not present

## 2020-04-11 DIAGNOSIS — S99911A Unspecified injury of right ankle, initial encounter: Secondary | ICD-10-CM | POA: Diagnosis not present

## 2020-04-14 DIAGNOSIS — S82891A Other fracture of right lower leg, initial encounter for closed fracture: Secondary | ICD-10-CM | POA: Diagnosis not present

## 2020-04-14 DIAGNOSIS — S82839A Other fracture of upper and lower end of unspecified fibula, initial encounter for closed fracture: Secondary | ICD-10-CM | POA: Diagnosis not present

## 2020-04-21 ENCOUNTER — Ambulatory Visit: Payer: Medicare Other | Admitting: Physical Medicine and Rehabilitation

## 2020-04-23 ENCOUNTER — Telehealth: Payer: Self-pay | Admitting: Obstetrics & Gynecology

## 2020-04-23 ENCOUNTER — Other Ambulatory Visit (INDEPENDENT_AMBULATORY_CARE_PROVIDER_SITE_OTHER): Payer: Medicare Other

## 2020-04-23 DIAGNOSIS — R300 Dysuria: Secondary | ICD-10-CM | POA: Diagnosis not present

## 2020-04-23 LAB — POCT URINALYSIS DIPSTICK
Glucose, UA: NEGATIVE
Ketones, UA: NEGATIVE
Nitrite, UA: NEGATIVE
Protein, UA: NEGATIVE

## 2020-04-23 NOTE — Telephone Encounter (Signed)
Cultured

## 2020-04-23 NOTE — Telephone Encounter (Signed)
Patient wants Dr. Despina Hidden to know that she has another UTI. Patient wanted Dr. Despina Hidden to know before 04/27/2020 appt..Per patient. Clinical staff will follow up with Patient.

## 2020-04-23 NOTE — Telephone Encounter (Signed)
Returned pt's call to clarify if she had been seen by someone or if she was basing it on symptoms alone. She stated that she had not been seen, but was having the same symptoms as before with the frequency, urgency, burning, and odor. Pt asked if her daughter-in-law could get a specimen cup so she could give [place] a sample. Spoke with Marchelle Folks RN who stated that we could do that. Hopefully the results would be back by her appt time on 2/14. Pt confirmed understanding and wishes to start taking Azo immediately for symptom relief. Instructed pt to wait until after she gave a urine sample before starting Azo.

## 2020-04-23 NOTE — Telephone Encounter (Signed)
Randie Heinz will evaluate when the culture returns

## 2020-04-23 NOTE — Progress Notes (Signed)
Patient with UTI symptoms. Will send for culture per Dr Despina Hidden.

## 2020-04-27 ENCOUNTER — Telehealth: Payer: Self-pay | Admitting: *Deleted

## 2020-04-27 ENCOUNTER — Ambulatory Visit: Payer: Medicare Other | Admitting: Obstetrics & Gynecology

## 2020-04-27 DIAGNOSIS — F3341 Major depressive disorder, recurrent, in partial remission: Secondary | ICD-10-CM | POA: Diagnosis not present

## 2020-04-27 NOTE — Telephone Encounter (Signed)
I will most definitely contact her when it returns

## 2020-04-27 NOTE — Telephone Encounter (Signed)
Spoke with patient to inform her that urine culture was not resulted yet. She opted to cancel her appointment for today. She wanted to let Dr. Despina Hidden know that she is having symptoms and feeling uncomfortable. She is aware that Dr. Despina Hidden is waiting for results prior to any treatment. Patient asked that we contact her when results are back or for Dr. Despina Hidden to let her know when she should come back in.

## 2020-04-29 LAB — URINE CULTURE

## 2020-04-30 ENCOUNTER — Other Ambulatory Visit: Payer: Self-pay

## 2020-04-30 ENCOUNTER — Encounter: Payer: Self-pay | Admitting: Obstetrics & Gynecology

## 2020-04-30 ENCOUNTER — Ambulatory Visit (INDEPENDENT_AMBULATORY_CARE_PROVIDER_SITE_OTHER): Payer: Medicare Other | Admitting: Obstetrics & Gynecology

## 2020-04-30 VITALS — BP 123/73 | HR 88

## 2020-04-30 DIAGNOSIS — R300 Dysuria: Secondary | ICD-10-CM

## 2020-04-30 DIAGNOSIS — N390 Urinary tract infection, site not specified: Secondary | ICD-10-CM

## 2020-04-30 LAB — POCT URINALYSIS DIPSTICK OB
Glucose, UA: NEGATIVE
Ketones, UA: NEGATIVE
Leukocytes, UA: NEGATIVE
Nitrite, UA: POSITIVE

## 2020-04-30 MED ORDER — FOSFOMYCIN TROMETHAMINE 3 G PO PACK: 3.0000 g | PACK | Freq: Once | ORAL | 1 refills | Status: AC

## 2020-05-01 ENCOUNTER — Emergency Department (HOSPITAL_COMMUNITY): Payer: Medicare Other

## 2020-05-01 ENCOUNTER — Inpatient Hospital Stay (HOSPITAL_COMMUNITY)
Admission: EM | Admit: 2020-05-01 | Discharge: 2020-05-06 | DRG: 872 | Disposition: A | Discharge status: Home Health | Payer: Medicare Other | Attending: Family Medicine | Admitting: Family Medicine

## 2020-05-01 ENCOUNTER — Telehealth: Payer: Self-pay | Admitting: *Deleted

## 2020-05-01 ENCOUNTER — Encounter: Payer: Self-pay | Admitting: Obstetrics & Gynecology

## 2020-05-01 ENCOUNTER — Other Ambulatory Visit: Payer: Self-pay

## 2020-05-01 ENCOUNTER — Encounter (HOSPITAL_COMMUNITY): Payer: Self-pay | Admitting: Emergency Medicine

## 2020-05-01 DIAGNOSIS — E871 Hypo-osmolality and hyponatremia: Secondary | ICD-10-CM | POA: Diagnosis present

## 2020-05-01 DIAGNOSIS — D696 Thrombocytopenia, unspecified: Secondary | ICD-10-CM | POA: Diagnosis present

## 2020-05-01 DIAGNOSIS — Z515 Encounter for palliative care: Secondary | ICD-10-CM | POA: Diagnosis not present

## 2020-05-01 DIAGNOSIS — R54 Age-related physical debility: Secondary | ICD-10-CM | POA: Diagnosis not present

## 2020-05-01 DIAGNOSIS — R509 Fever, unspecified: Secondary | ICD-10-CM

## 2020-05-01 DIAGNOSIS — N136 Pyonephrosis: Secondary | ICD-10-CM | POA: Diagnosis present

## 2020-05-01 DIAGNOSIS — N179 Acute kidney failure, unspecified: Secondary | ICD-10-CM

## 2020-05-01 DIAGNOSIS — F419 Anxiety disorder, unspecified: Secondary | ICD-10-CM | POA: Diagnosis present

## 2020-05-01 DIAGNOSIS — E785 Hyperlipidemia, unspecified: Secondary | ICD-10-CM | POA: Diagnosis present

## 2020-05-01 DIAGNOSIS — Z87440 Personal history of urinary (tract) infections: Secondary | ICD-10-CM

## 2020-05-01 DIAGNOSIS — E860 Dehydration: Secondary | ICD-10-CM | POA: Diagnosis not present

## 2020-05-01 DIAGNOSIS — R7881 Bacteremia: Secondary | ICD-10-CM | POA: Diagnosis not present

## 2020-05-01 DIAGNOSIS — K219 Gastro-esophageal reflux disease without esophagitis: Secondary | ICD-10-CM | POA: Diagnosis not present

## 2020-05-01 DIAGNOSIS — A419 Sepsis, unspecified organism: Secondary | ICD-10-CM | POA: Diagnosis not present

## 2020-05-01 DIAGNOSIS — N200 Calculus of kidney: Secondary | ICD-10-CM | POA: Diagnosis not present

## 2020-05-01 DIAGNOSIS — I959 Hypotension, unspecified: Secondary | ICD-10-CM | POA: Diagnosis not present

## 2020-05-01 DIAGNOSIS — B9620 Unspecified Escherichia coli [E. coli] as the cause of diseases classified elsewhere: Secondary | ICD-10-CM | POA: Diagnosis present

## 2020-05-01 DIAGNOSIS — Z87891 Personal history of nicotine dependence: Secondary | ICD-10-CM

## 2020-05-01 DIAGNOSIS — Z885 Allergy status to narcotic agent status: Secondary | ICD-10-CM

## 2020-05-01 DIAGNOSIS — D6489 Other specified anemias: Secondary | ICD-10-CM | POA: Diagnosis present

## 2020-05-01 DIAGNOSIS — R1084 Generalized abdominal pain: Secondary | ICD-10-CM | POA: Diagnosis not present

## 2020-05-01 DIAGNOSIS — E876 Hypokalemia: Secondary | ICD-10-CM | POA: Diagnosis not present

## 2020-05-01 DIAGNOSIS — Z7189 Other specified counseling: Secondary | ICD-10-CM | POA: Diagnosis not present

## 2020-05-01 DIAGNOSIS — E441 Mild protein-calorie malnutrition: Secondary | ICD-10-CM | POA: Diagnosis not present

## 2020-05-01 DIAGNOSIS — N1330 Unspecified hydronephrosis: Secondary | ICD-10-CM | POA: Diagnosis not present

## 2020-05-01 DIAGNOSIS — Z881 Allergy status to other antibiotic agents status: Secondary | ICD-10-CM

## 2020-05-01 DIAGNOSIS — Z800 Family history of malignant neoplasm of digestive organs: Secondary | ICD-10-CM

## 2020-05-01 DIAGNOSIS — Z79890 Hormone replacement therapy (postmenopausal): Secondary | ICD-10-CM

## 2020-05-01 DIAGNOSIS — Z1624 Resistance to multiple antibiotics: Secondary | ICD-10-CM | POA: Diagnosis not present

## 2020-05-01 DIAGNOSIS — K449 Diaphragmatic hernia without obstruction or gangrene: Secondary | ICD-10-CM | POA: Diagnosis not present

## 2020-05-01 DIAGNOSIS — A4151 Sepsis due to Escherichia coli [E. coli]: Principal | ICD-10-CM | POA: Diagnosis present

## 2020-05-01 DIAGNOSIS — I10 Essential (primary) hypertension: Secondary | ICD-10-CM | POA: Diagnosis present

## 2020-05-01 DIAGNOSIS — Z66 Do not resuscitate: Secondary | ICD-10-CM | POA: Diagnosis not present

## 2020-05-01 DIAGNOSIS — Y92009 Unspecified place in unspecified non-institutional (private) residence as the place of occurrence of the external cause: Secondary | ICD-10-CM

## 2020-05-01 DIAGNOSIS — Z20822 Contact with and (suspected) exposure to covid-19: Secondary | ICD-10-CM | POA: Diagnosis not present

## 2020-05-01 DIAGNOSIS — Z79899 Other long term (current) drug therapy: Secondary | ICD-10-CM

## 2020-05-01 DIAGNOSIS — M069 Rheumatoid arthritis, unspecified: Secondary | ICD-10-CM | POA: Diagnosis present

## 2020-05-01 DIAGNOSIS — F319 Bipolar disorder, unspecified: Secondary | ICD-10-CM | POA: Diagnosis present

## 2020-05-01 DIAGNOSIS — W19XXXA Unspecified fall, initial encounter: Secondary | ICD-10-CM

## 2020-05-01 DIAGNOSIS — N3000 Acute cystitis without hematuria: Secondary | ICD-10-CM

## 2020-05-01 DIAGNOSIS — F39 Unspecified mood [affective] disorder: Secondary | ICD-10-CM | POA: Diagnosis not present

## 2020-05-01 DIAGNOSIS — I700 Atherosclerosis of aorta: Secondary | ICD-10-CM | POA: Diagnosis present

## 2020-05-01 DIAGNOSIS — E039 Hypothyroidism, unspecified: Secondary | ICD-10-CM | POA: Diagnosis present

## 2020-05-01 DIAGNOSIS — M25561 Pain in right knee: Secondary | ICD-10-CM | POA: Diagnosis not present

## 2020-05-01 DIAGNOSIS — Z6823 Body mass index (BMI) 23.0-23.9, adult: Secondary | ICD-10-CM

## 2020-05-01 DIAGNOSIS — Z880 Allergy status to penicillin: Secondary | ICD-10-CM

## 2020-05-01 DIAGNOSIS — D539 Nutritional anemia, unspecified: Secondary | ICD-10-CM | POA: Diagnosis not present

## 2020-05-01 DIAGNOSIS — N390 Urinary tract infection, site not specified: Secondary | ICD-10-CM | POA: Diagnosis not present

## 2020-05-01 DIAGNOSIS — Z9181 History of falling: Secondary | ICD-10-CM

## 2020-05-01 LAB — CBC WITH DIFFERENTIAL/PLATELET
Abs Immature Granulocytes: 0.5 10*3/uL — ABNORMAL HIGH (ref 0.00–0.07)
Basophils Absolute: 0.1 10*3/uL (ref 0.0–0.1)
Basophils Relative: 0 %
Eosinophils Absolute: 0.2 10*3/uL (ref 0.0–0.5)
Eosinophils Relative: 1 %
HCT: 34.4 % — ABNORMAL LOW (ref 36.0–46.0)
Hemoglobin: 11.2 g/dL — ABNORMAL LOW (ref 12.0–15.0)
Immature Granulocytes: 3 %
Lymphocytes Relative: 6 %
Lymphs Abs: 1.2 10*3/uL (ref 0.7–4.0)
MCH: 32.8 pg (ref 26.0–34.0)
MCHC: 32.6 g/dL (ref 30.0–36.0)
MCV: 100.9 fL — ABNORMAL HIGH (ref 80.0–100.0)
Monocytes Absolute: 2.1 10*3/uL — ABNORMAL HIGH (ref 0.1–1.0)
Monocytes Relative: 11 %
Neutro Abs: 16.2 10*3/uL — ABNORMAL HIGH (ref 1.7–7.7)
Neutrophils Relative %: 79 %
Platelets: 160 10*3/uL (ref 150–400)
RBC: 3.41 MIL/uL — ABNORMAL LOW (ref 3.87–5.11)
RDW: 12.5 % (ref 11.5–15.5)
WBC: 20.2 10*3/uL — ABNORMAL HIGH (ref 4.0–10.5)
nRBC: 0 % (ref 0.0–0.2)

## 2020-05-01 LAB — LACTIC ACID, PLASMA: Lactic Acid, Venous: 1.8 mmol/L (ref 0.5–1.9)

## 2020-05-01 LAB — COMPREHENSIVE METABOLIC PANEL
ALT: 16 U/L (ref 0–44)
AST: 21 U/L (ref 15–41)
Albumin: 3 g/dL — ABNORMAL LOW (ref 3.5–5.0)
Alkaline Phosphatase: 77 U/L (ref 38–126)
Anion gap: 10 (ref 5–15)
BUN: 35 mg/dL — ABNORMAL HIGH (ref 8–23)
CO2: 21 mmol/L — ABNORMAL LOW (ref 22–32)
Calcium: 8.3 mg/dL — ABNORMAL LOW (ref 8.9–10.3)
Chloride: 99 mmol/L (ref 98–111)
Creatinine, Ser: 2.23 mg/dL — ABNORMAL HIGH (ref 0.44–1.00)
GFR, Estimated: 21 mL/min — ABNORMAL LOW (ref >60)
Glucose, Bld: 128 mg/dL — ABNORMAL HIGH (ref 70–99)
Potassium: 3.2 mmol/L — ABNORMAL LOW (ref 3.5–5.1)
Sodium: 130 mmol/L — ABNORMAL LOW (ref 135–145)
Total Bilirubin: 0.8 mg/dL (ref 0.3–1.2)
Total Protein: 6.5 g/dL (ref 6.5–8.1)

## 2020-05-01 LAB — LIPASE, BLOOD: Lipase: 19 U/L (ref 11–51)

## 2020-05-01 MED ORDER — SODIUM CHLORIDE 0.9 % IV SOLN: INTRAVENOUS | Status: DC

## 2020-05-01 MED ORDER — SODIUM CHLORIDE 0.9 % IV BOLUS
500.0000 mL | Freq: Once | INTRAVENOUS | Status: AC
  Administered 2020-05-01: 500 mL via INTRAVENOUS

## 2020-05-01 MED ORDER — IOHEXOL 9 MG/ML PO SOLN
ORAL | Status: AC
  Filled 2020-05-01: qty 1000

## 2020-05-01 MED ORDER — IOHEXOL 300 MG/ML  SOLN: 100.0000 mL | Freq: Once | INTRAMUSCULAR | Status: DC | PRN

## 2020-05-01 NOTE — ED Notes (Signed)
ED Provider at bedside. 

## 2020-05-01 NOTE — ED Provider Notes (Addendum)
[facility] EMERGENCY DEPARTMENT Provider Note   CSN: [id number] Arrival date & time: 05/01/20  1819     History Chief Complaint  Patient presents with  . Abdominal Pain    Erica [name] is a 87 y.o. female.  Patient brought in now by family member.  Patient's daughter-in-law is with her.  Patient has been having fever for a few days.  And some abdominal discomfort.  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  So patient saw Dr. Delfin Edis yesterday Thursday and they did cath urine in the office and pot apparently positive nitrite.  And patient was started on Fusfomycin.  This is been recommended by Dr. Algis Liming.  Patient is known to have resistant E. coli.  We restarted her on this antibiotic that she has been on in the past.  However patient did have fevers today.  Highest was 101.  Temp upon arrival here was 98.2.  But patient did have Tylenol at home.  They contacted her primary care doctor and they was concerned about possible Covid infection so she was seen earlier today and had a negative PCR test for Covid and influenza.  But her doctor was concerned that maybe she has some signs of sepsis.  Patient has a history of rheumatoid arthritis.  Patient's heart rate here was originally around 121.  But now in the room at about 99.  Blood pressure is 101/56 oxygen saturation is 90% 6% on room air respirations 17.  Other than the heart rate not meeting sepsis criteria.  Patient has a history of anxiety bipolar disorder hyperlipidemia hypothyroidism and rheumatoid arthritis and irritable bowel syndrome.  On top of the frequent urinary tract infections.        Past Medical History:  Diagnosis Date  . Anxiety   . Bipolar disorder (HCC)   . Hyperlipidemia   . Hypothyroidism   . Irritable bowel syndrome   . RA (rheumatoid arthritis) [facility])     Patient Active Problem List   Diagnosis Date Noted  . Nausea without vomiting 01/07/2020  .  Bloating 01/28/2019  . Boil of groin 12/31/2018  . Constipation in female 06/22/2017  . Fall 05/07/2017  . Right rib fracture 05/07/2017  . AKI (acute kidney injury) (HCC) 05/04/2017  . Hyponatremia 05/03/2017  . Bipolar disorder with severe depression (HCC) 11/23/2016  . Hypertension 11/23/2016  . Rheumatoid arthritis involving both hands with positive rheumatoid factor (HCC) 06/01/2016  . Partial small bowel obstruction (HCC) 03/12/2015  . Depression 03/12/2015  . Hypotension 03/12/2015  . Lactic acidosis 03/12/2015  . Dehydration 03/12/2015  . Small bowel obstruction (HCC) 03/12/2015  . Hypothyroidism 02/21/2014  . 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 Major depressive disorder, recurrent episode with anxious distress (HCC) 02/13/2014  . Tachycardia 08/24/2013  . S/P arthroscopy of shoulder 08/14/2013  . Muscle weakness (generalized) 06/14/2013  . Rotator cuff syndrome of right shoulder 12/05/2012  . Right shoulder pain 12/05/2012  . Rectal bleeding 06/06/2011  . Epigastric pain 04/19/2011  . GERD (gastroesophageal reflux disease) 04/19/2011  . IBS (irritable bowel syndrome) 04/19/2011  . METATARSALGIA 05/12/2009  . BUNION 05/12/2009    Past Surgical History:  Procedure Laterality Date  . ABDOMINAL HYSTERECTOMY    . Bone Spurs  08/07/13   Right Shoulder  . COLONOSCOPY    . COLONOSCOPY N/A 05/08/2012   Procedure: COLONOSCOPY;  Surgeon: Malissa Hippo, MD;  Location: [facility] ENDO [facility];  Service: Endoscopy;  Laterality: N/A;  730  .  COLOSTOMY    . ESOPHAGOGASTRODUODENOSCOPY N/A 11/04/2013   Procedure: ESOPHAGOGASTRODUODENOSCOPY (EGD);  Surgeon: Malissa Hippo, MD;  Location: [facility];  Service: Endoscopy;  Laterality: N/A;  730  . EYE SURGERY  cataract x 2  . MALONEY DILATION N/A 11/04/2013   Procedure: Elease Hashimoto DILATION;  Surgeon: Malissa Hippo, MD;  Location: [facility];  Service: Endoscopy;  Laterality: N/A;  . UPPER GASTROINTESTINAL ENDOSCOPY       OB History     Gravida  3   Para  3   Term      Preterm      AB      Living  3     SAB      IAB      Ectopic      Multiple      Live Births              Family History  Problem Relation Age of Onset  . Colon cancer Mother   . Bipolar disorder Mother   . Anxiety disorder Mother   . Atrial fibrillation Son   . Depression Maternal Grandmother     Social History   Tobacco Use  . Smoking status: Former Smoker    Packs/day: 1.50    Years: 27.00    Pack years: 40.50    Types: Cigarettes    Quit date: 04/18/1981    Years since quitting: 39.0  . Smokeless tobacco: Former Neurosurgeon    Quit date: 11/27/1980  Vaping Use  . Vaping Use: Never used  Substance Use Topics  . Alcohol use: Yes    Alcohol/week: 1.0 standard drink    Types: 1 Glasses of wine per week  . Drug use: No    Home Medications Prior to Admission medications   Medication Sig Start Date End Date Taking? Authorizing Provider  acetaminophen (TYLENOL) 650 MG CR tablet Take 1,300 mg by mouth daily.    [provider]  Cholecalciferol 25 MCG (1000 UT) tablet Take by mouth daily. 02/27/20   [provider]  Cyanocobalamin (VITAMIN B-12 PO) Take by mouth daily.    [provider]  fluconazole (DIFLUCAN) 50 MG tablet Take 1 tablet (50 mg total) by mouth daily. 04/06/20   Lazaro Arms, MD  fosfomycin (MONUROL) 3 g PACK 1 tablet today, repeat 1 tablet in 3 days 04/10/20   Lazaro Arms, MD  LORazepam (ATIVAN) 0.5 MG tablet 1 tablet    [provider]  losartan (COZAAR) 50 MG tablet Take 75 mg by mouth daily.  07/02/19   [provider]  metoprolol succinate (TOPROL-XL) 25 MG 24 hr tablet Take 25 mg by mouth daily. 03/26/20   [provider]  Multiple Vitamins-Minerals (CENTRUM SILVER 50+WOMEN) TABS Take 1 tablet by mouth.    [provider]  ondansetron (ZOFRAN) 4 MG tablet Take 1 tablet (4 mg total) by mouth every 8 (eight) hours as needed for nausea or vomiting.  12/26/19   Idol, [name], PA-C  pantoprazole (PROTONIX) 40 MG tablet Take 1 tablet (40 mg total) by mouth daily before breakfast. 04/01/20   Rehman, Joline Maxcy, MD  phenelzine (NARDIL) 15 MG tablet Take 15 mg by mouth 2 (two) times daily. 02/26/20   [provider]  polyethylene glycol powder (GLYCOLAX/MIRALAX) powder 17 gram po qd as directed. 09/20/17   Aliene Beams, MD  polyvinyl alcohol (LIQUIFILM TEARS) 1.4 % ophthalmic solution Place one drop into both eyes 4 (four) times a day as  needed for up to 30 days. 02/26/20   [provider]  Probiotic, Lactobacillus, CAPS Take 1 capsule by mouth daily.    [provider]  Simethicone (PHAZYME ULTRA STRENGTH) 180 MG CAPS Take 1 capsule (180 mg total) by mouth 3 (three) times daily after meals. 01/07/20   Rehman, Joline Maxcy, MD  SYNTHROID 75 MCG tablet Take 1 tablet (75 mcg total) by mouth daily. 09/20/17   Aliene Beams, MD  terconazole (TERAZOL 7) 0.4 % vaginal cream Place 1 applicator vaginally at bedtime. 04/06/20   Lazaro Arms, MD    Allergies    Penicillins, Bactrim [sulfamethoxazole-trimethoprim], Hydrocodone, and Morphine and related  Review of Systems   Review of Systems  Constitutional: Positive for fatigue and fever. Negative for chills.  HENT: Negative for rhinorrhea and sore throat.   Eyes: Negative for visual disturbance.  Respiratory: Negative for cough and shortness of breath.   Cardiovascular: Negative for chest pain and leg swelling.  Gastrointestinal: Positive for abdominal pain. Negative for diarrhea, nausea and vomiting.  Genitourinary: Negative for dysuria.  Musculoskeletal: Negative for back pain and neck pain.  Skin: Negative for rash.  Neurological: Positive for weakness. Negative for dizziness, light-headedness and headaches.  Hematological: Does not bruise/bleed easily.  Psychiatric/Behavioral: Negative for confusion.    Physical Exam Updated Vital Signs BP (!) 95/57   Pulse 98   Temp  98.2 F (36.8 C) (Oral)   Resp 17   Ht 1.524 m (5')   Wt 54 kg   SpO2 94%   BMI 23.24 kg/m   Physical Exam Vitals and nursing note reviewed.  Constitutional:      General: She is not in acute distress.    Appearance: Normal appearance. She is well-developed and well-nourished.  HENT:     Head: Normocephalic and atraumatic.  Eyes:     Extraocular Movements: Extraocular movements intact.     Conjunctiva/sclera: Conjunctivae normal.     Pupils: Pupils are equal, round, and reactive to light.  Cardiovascular:     Rate and Rhythm: Normal rate and regular rhythm.     Heart sounds: No murmur heard.   Pulmonary:     Effort: Pulmonary effort is normal. No respiratory distress.     Breath sounds: Normal breath sounds.  Abdominal:     General: There is distension.     Palpations: Abdomen is soft.  Musculoskeletal:        General: No swelling or edema. Normal range of motion.     Cervical back: Normal range of motion and neck supple.  Skin:    General: Skin is warm and dry.  Neurological:     General: No focal deficit present.     Mental Status: She is alert and oriented to person, place, and time.     Cranial Nerves: No cranial nerve deficit.     Sensory: No sensory deficit.     Motor: No weakness.  Psychiatric:        Mood and Affect: Mood and affect normal.     ED Results / Procedures / Treatments   Labs (all labs ordered are listed, but only abnormal results are displayed) Labs Reviewed  CBC WITH DIFFERENTIAL/PLATELET - Abnormal; Notable for the following components:      Result Value   WBC 20.2 (*)    RBC 3.41 (*)    Hemoglobin 11.2 (*)    HCT 34.4 (*)    MCV 100.9 (*)    Neutro Abs 16.2 (*)  Monocytes Absolute 2.1 (*)    Abs Immature Granulocytes 0.50 (*)    All other components within normal limits  CULTURE, BLOOD (ROUTINE X 2)  CULTURE, BLOOD (ROUTINE X 2)  LACTIC ACID, PLASMA  COMPREHENSIVE METABOLIC PANEL  LIPASE, BLOOD     EKG None  Radiology DG Chest Port 1 View  Result Date: 05/01/2020 CLINICAL DATA:  Fever EXAM: PORTABLE CHEST 1 VIEW COMPARISON:  05/07/2017 FINDINGS: The heart size and mediastinal contours are within normal limits. Both lungs are clear. The visualized skeletal structures are unremarkable. IMPRESSION: No active disease. Electronically Signed   By: Deatra Robinson M.D.   On: 05/01/2020 23:23    Procedures Procedures   Medications Ordered in ED Medications  0.9 %  sodium chloride infusion ( Intravenous New Bag/Given 05/01/20 2305)  iohexol (OMNIPAQUE) 300 MG/ML solution 100 mL (has no administration in time range)  sodium chloride 0.9 % bolus 500 mL (500 mLs Intravenous New Bag/Given 05/01/20 2306)    ED Course  I have reviewed the triage vital signs and the nursing notes.  Pertinent labs & imaging results that were available during my care of the patient were reviewed by me and considered in my medical decision making (see chart for details).    MDM Rules/Calculators/A&P                          Based on work-up so far.  Vital signs reassuring not meeting sepsis criteria.  We will go ahead and do a general screen.  With labs lactic acid and since she has abdominal distention and subjective tenderness I will going get CT scan of abdomen.  As well as chest x-ray.  Blood cultures.  We will not repeat urinalysis here since patient just had first dose of antibiotic today.  And was abnormal yesterday.  Rest x-ray without any acute findings.  CBC is significant for leukocytosis with a white count of 20,000.  Something may be ongoing.  Patient is known to have a urinary tract infection most likely.  But normally does not get fevers with that.  As stated in the HPI patient's Covid testing flu testing negative.  Patient's BUN and creatinine is elevated consistent with acute kidney injury.  This probably warrant admission.  CT scan still needs to be completed.   Final Clinical  Impression(s) / ED Diagnoses Final diagnoses:  Generalized abdominal pain  Fever, unspecified fever cause  Acute cystitis without hematuria    Rx / DC Orders ED Discharge Orders    None       Vanetta Mulders, MD 05/01/20 1308    Vanetta Mulders, MD 05/02/20 0001    Vanetta Mulders, MD 05/02/20 [phone]

## 2020-05-01 NOTE — Telephone Encounter (Signed)
Erica [name] called to let us know that Kerin had a low grade temp this morning of 100.5.  Wanted to make Dr Despina Hidden aware.  Rapid COVID test negative @ home.  She is taking antibiotics that were prescribed.  Informed culture was still pending.

## 2020-05-01 NOTE — Telephone Encounter (Signed)
Noted,  hope she gets to feeling better  No change for now

## 2020-05-01 NOTE — ED Notes (Signed)
One set of blood cultures collected, lab to get second set.

## 2020-05-01 NOTE — Progress Notes (Signed)
Chief Complaint  Patient presents with  . Urinary Tract Infection      87 y.o. G3P3 No LMP recorded. Patient has had a hysterectomy. The current method of family planning is post menopausal status.  Outpatient Encounter Medications as of 04/30/2020  Medication Sig Note  . acetaminophen (TYLENOL) 650 MG CR tablet Take 1,300 mg by mouth daily. 01/28/2019: As needed  . Cholecalciferol 25 MCG (1000 UT) tablet Take by mouth daily.   . Cyanocobalamin (VITAMIN B-12 PO) Take by mouth daily.   . fluconazole (DIFLUCAN) 50 MG tablet Take 1 tablet (50 mg total) by mouth daily. 04/08/2020: 1/2 tablet po once a day.  . fosfomycin (MONUROL) 3 g PACK 1 tablet today, repeat 1 tablet in 3 days   . [EXPIRED] fosfomycin (MONUROL) 3 g PACK Take 3 g by mouth once for 1 dose. Repeat in 3 days   . LORazepam (ATIVAN) 0.5 MG tablet 1 tablet 04/08/2020: 1/2-1 prn per patient   . losartan (COZAAR) 50 MG tablet Take 75 mg by mouth daily.    . metoprolol succinate (TOPROL-XL) 25 MG 24 hr tablet Take 25 mg by mouth daily.   . Multiple Vitamins-Minerals (CENTRUM SILVER 50+WOMEN) TABS Take 1 tablet by mouth.   . ondansetron (ZOFRAN) 4 MG tablet Take 1 tablet (4 mg total) by mouth every 8 (eight) hours as needed for nausea or vomiting.   . pantoprazole (PROTONIX) 40 MG tablet Take 1 tablet (40 mg total) by mouth daily before breakfast.   . phenelzine (NARDIL) 15 MG tablet Take 15 mg by mouth 2 (two) times daily.   . polyethylene glycol powder (GLYCOLAX/MIRALAX) powder 17 gram po qd as directed. 01/28/2019: Takes as needed.  . polyvinyl alcohol (LIQUIFILM TEARS) 1.4 % ophthalmic solution Place one drop into both eyes 4 (four) times a day as needed for up to 30 days.   . Probiotic, Lactobacillus, CAPS Take 1 capsule by mouth daily.   . Simethicone (PHAZYME ULTRA STRENGTH) 180 MG CAPS Take 1 capsule (180 mg total) by mouth 3 (three) times daily after meals. 04/08/2020: As needed per patient.  Marland Kitchen SYNTHROID 75 MCG tablet  Take 1 tablet (75 mcg total) by mouth daily.   Marland Kitchen terconazole (TERAZOL 7) 0.4 % vaginal cream Place 1 applicator vaginally at bedtime.    No facility-administered encounter medications on file as of 04/30/2020.    Subjective Pt with recurrent UTIs in recent months, some cultures done sometimes not  Last one was multi drug resistant E coli treated with fosfomycin based Dr Zenaida Niece Dam's recommendation  I&O cath done today +nitirites Past Medical History:  Diagnosis Date  . Anxiety   . Bipolar disorder (HCC)   . Hyperlipidemia   . Hypothyroidism   . Irritable bowel syndrome   . RA (rheumatoid arthritis) (HCC)     Past Surgical History:  Procedure Laterality Date  . ABDOMINAL HYSTERECTOMY    . Bone Spurs  08/07/13   Right Shoulder  . COLONOSCOPY    . COLONOSCOPY N/A 05/08/2012   Procedure: COLONOSCOPY;  Surgeon: Malissa Hippo, MD;  Location: [facility];  Service: Endoscopy;  Laterality: N/A;  730  . COLOSTOMY    . ESOPHAGOGASTRODUODENOSCOPY N/A 11/04/2013   Procedure: ESOPHAGOGASTRODUODENOSCOPY (EGD);  Surgeon: Malissa Hippo, MD;  Location: [facility];  Service: Endoscopy;  Laterality: N/A;  730  . EYE SURGERY  cataract x 2  . MALONEY DILATION N/A 11/04/2013   Procedure: MALONEY DILATION;  Surgeon: Joline Maxcy  Karilyn Cota, MD;  Location: [facility];  Service: Endoscopy;  Laterality: N/A;  . UPPER GASTROINTESTINAL ENDOSCOPY      OB History    Gravida  3   Para  3   Term      Preterm      AB      Living  3     SAB      IAB      Ectopic      Multiple      Live Births              Allergies  Allergen Reactions  . Penicillins Itching and Rash  . Bactrim [Sulfamethoxazole-Trimethoprim] Nausea Only    And diarrhea.  . Hydrocodone     MAO-I ; cannot take medications.   . Morphine And Related     Unable to take due to MAO-I    Social History   Socioeconomic History  . Marital status: Widowed    Spouse name: Not on file  . Number of children: Not on  file  . Years of education: Not on file  . Highest education level: Not on file  Occupational History  . Not on file  Tobacco Use  . Smoking status: Former Smoker    Packs/day: 1.50    Years: 27.00    Pack years: 40.50    Types: Cigarettes    Quit date: 04/18/1981    Years since quitting: 39.0  . Smokeless tobacco: Former Neurosurgeon    Quit date: 11/27/1980  Vaping Use  . Vaping Use: Never used  Substance and Sexual Activity  . Alcohol use: Yes    Alcohol/week: 1.0 standard drink    Types: 1 Glasses of wine per week  . Drug use: No  . Sexual activity: Not Currently    Partners: Male    Birth control/protection: Post-menopausal, Surgical    Comment: hyst  Other Topics Concern  . Not on file  Social History Narrative  . Not on file   Social Determinants of Health   Financial Resource Strain: Not on file  Food Insecurity: Not on file  Transportation Needs: Not on file  Physical Activity: Not on file  Stress: Not on file  Social Connections: Not on file    Family History  Problem Relation Age of Onset  . Colon cancer Mother   . Bipolar disorder Mother   . Anxiety disorder Mother   . Atrial fibrillation Son   . Depression Maternal Grandmother     Medications:       Current Outpatient Medications:  .  acetaminophen (TYLENOL) 650 MG CR tablet, Take 1,300 mg by mouth daily., Disp: , Rfl:  .  Cholecalciferol 25 MCG (1000 UT) tablet, Take by mouth daily., Disp: , Rfl:  .  Cyanocobalamin (VITAMIN B-12 PO), Take by mouth daily., Disp: , Rfl:  .  fluconazole (DIFLUCAN) 50 MG tablet, Take 1 tablet (50 mg total) by mouth daily., Disp: 14 tablet, Rfl: 0 .  fosfomycin (MONUROL) 3 g PACK, 1 tablet today, repeat 1 tablet in 3 days, Disp: 3 g, Rfl: 1 .  LORazepam (ATIVAN) 0.5 MG tablet, 1 tablet, Disp: , Rfl:  .  losartan (COZAAR) 50 MG tablet, Take 75 mg by mouth daily. , Disp: , Rfl:  .  metoprolol succinate (TOPROL-XL) 25 MG 24 hr tablet, Take 25 mg by mouth daily., Disp: , Rfl:  .   Multiple Vitamins-Minerals (CENTRUM SILVER 50+WOMEN) TABS, Take 1 tablet by mouth., Disp: , Rfl:  .  ondansetron (ZOFRAN) 4 MG tablet, Take 1 tablet (4 mg total) by mouth every 8 (eight) hours as needed for nausea or vomiting., Disp: 20 tablet, Rfl: 0 .  pantoprazole (PROTONIX) 40 MG tablet, Take 1 tablet (40 mg total) by mouth daily before breakfast., Disp: 90 tablet, Rfl: 3 .  phenelzine (NARDIL) 15 MG tablet, Take 15 mg by mouth 2 (two) times daily., Disp: , Rfl:  .  polyethylene glycol powder (GLYCOLAX/MIRALAX) powder, 17 gram po qd as directed., Disp: 1530 g, Rfl: 1 .  polyvinyl alcohol (LIQUIFILM TEARS) 1.4 % ophthalmic solution, Place one drop into both eyes 4 (four) times a day as needed for up to 30 days., Disp: , Rfl:  .  Probiotic, Lactobacillus, CAPS, Take 1 capsule by mouth daily., Disp: , Rfl:  .  Simethicone (PHAZYME ULTRA STRENGTH) 180 MG CAPS, Take 1 capsule (180 mg total) by mouth 3 (three) times daily after meals., Disp: , Rfl: 0 .  SYNTHROID 75 MCG tablet, Take 1 tablet (75 mcg total) by mouth daily., Disp: 90 tablet, Rfl: 1 .  terconazole (TERAZOL 7) 0.4 % vaginal cream, Place 1 applicator vaginally at bedtime., Disp: 45 g, Rfl: 0  Objective Blood pressure 123/73, pulse 88.  Atrophic changes jno lesions I&O done  Pertinent ROS No burning with urination, frequency or urgency No nausea, vomiting or diarrhea Nor fever chills or other constitutional symptoms   Labs or studies Culture pending +nitirites    Impression Diagnoses this Encounter::   ICD-10-CM   1. Recurrent UTI  N39.0   2. Dysuria  R30.0 Urine Culture    POC Urinalysis Dipstick OB    Established relevant diagnosis(es):   Plan/Recommendations: Meds ordered this encounter  Medications  . fosfomycin (MONUROL) 3 g PACK    Sig: Take 3 g by mouth once for 1 dose. Repeat in 3 days    Dispense:  3 g    Refill:  1    Another multi drug resistant UTI    Labs or Scans Ordered: Orders Placed This  Encounter  Procedures  . Urine Culture  . POC Urinalysis Dipstick OB    Management:: Discussed with Dr Earlene Plater in Dr Clinton Gallant absence, re treat with fosfomycin await culture report  Follow up No follow-ups on file.       All questions were answered.

## 2020-05-01 NOTE — ED Triage Notes (Signed)
Pt's daughter in law states pt saw Dr Despina Hidden for UTI s/s that started last friday. Was recently treated for a UTI two weeks ago. States having a fever today. Tylenol given at 1730

## 2020-05-02 ENCOUNTER — Encounter (HOSPITAL_COMMUNITY): Payer: Self-pay | Admitting: Internal Medicine

## 2020-05-02 ENCOUNTER — Emergency Department (HOSPITAL_COMMUNITY): Payer: Medicare Other

## 2020-05-02 DIAGNOSIS — E785 Hyperlipidemia, unspecified: Secondary | ICD-10-CM | POA: Diagnosis present

## 2020-05-02 DIAGNOSIS — A419 Sepsis, unspecified organism: Secondary | ICD-10-CM | POA: Diagnosis present

## 2020-05-02 DIAGNOSIS — E860 Dehydration: Secondary | ICD-10-CM

## 2020-05-02 DIAGNOSIS — N179 Acute kidney failure, unspecified: Secondary | ICD-10-CM | POA: Diagnosis present

## 2020-05-02 DIAGNOSIS — E441 Mild protein-calorie malnutrition: Secondary | ICD-10-CM | POA: Diagnosis present

## 2020-05-02 DIAGNOSIS — R7881 Bacteremia: Secondary | ICD-10-CM | POA: Diagnosis not present

## 2020-05-02 DIAGNOSIS — E876 Hypokalemia: Secondary | ICD-10-CM | POA: Diagnosis present

## 2020-05-02 DIAGNOSIS — I10 Essential (primary) hypertension: Secondary | ICD-10-CM | POA: Diagnosis present

## 2020-05-02 DIAGNOSIS — Z881 Allergy status to other antibiotic agents status: Secondary | ICD-10-CM | POA: Diagnosis not present

## 2020-05-02 DIAGNOSIS — F419 Anxiety disorder, unspecified: Secondary | ICD-10-CM | POA: Diagnosis present

## 2020-05-02 DIAGNOSIS — N136 Pyonephrosis: Secondary | ICD-10-CM | POA: Diagnosis present

## 2020-05-02 DIAGNOSIS — Z87440 Personal history of urinary (tract) infections: Secondary | ICD-10-CM | POA: Diagnosis not present

## 2020-05-02 DIAGNOSIS — M25561 Pain in right knee: Secondary | ICD-10-CM | POA: Diagnosis present

## 2020-05-02 DIAGNOSIS — A4151 Sepsis due to Escherichia coli [E. coli]: Secondary | ICD-10-CM | POA: Diagnosis present

## 2020-05-02 DIAGNOSIS — R54 Age-related physical debility: Secondary | ICD-10-CM | POA: Diagnosis not present

## 2020-05-02 DIAGNOSIS — Z20822 Contact with and (suspected) exposure to covid-19: Secondary | ICD-10-CM | POA: Diagnosis present

## 2020-05-02 DIAGNOSIS — Z66 Do not resuscitate: Secondary | ICD-10-CM | POA: Diagnosis not present

## 2020-05-02 DIAGNOSIS — D6489 Other specified anemias: Secondary | ICD-10-CM | POA: Diagnosis present

## 2020-05-02 DIAGNOSIS — N390 Urinary tract infection, site not specified: Secondary | ICD-10-CM

## 2020-05-02 DIAGNOSIS — M069 Rheumatoid arthritis, unspecified: Secondary | ICD-10-CM | POA: Diagnosis present

## 2020-05-02 DIAGNOSIS — N3000 Acute cystitis without hematuria: Secondary | ICD-10-CM

## 2020-05-02 DIAGNOSIS — E871 Hypo-osmolality and hyponatremia: Secondary | ICD-10-CM | POA: Diagnosis present

## 2020-05-02 DIAGNOSIS — D539 Nutritional anemia, unspecified: Secondary | ICD-10-CM | POA: Diagnosis present

## 2020-05-02 DIAGNOSIS — E039 Hypothyroidism, unspecified: Secondary | ICD-10-CM | POA: Diagnosis present

## 2020-05-02 DIAGNOSIS — Z1624 Resistance to multiple antibiotics: Secondary | ICD-10-CM | POA: Diagnosis present

## 2020-05-02 DIAGNOSIS — D696 Thrombocytopenia, unspecified: Secondary | ICD-10-CM | POA: Diagnosis present

## 2020-05-02 DIAGNOSIS — K219 Gastro-esophageal reflux disease without esophagitis: Secondary | ICD-10-CM | POA: Diagnosis present

## 2020-05-02 DIAGNOSIS — F319 Bipolar disorder, unspecified: Secondary | ICD-10-CM | POA: Diagnosis present

## 2020-05-02 DIAGNOSIS — I959 Hypotension, unspecified: Secondary | ICD-10-CM | POA: Diagnosis present

## 2020-05-02 LAB — BLOOD CULTURE ID PANEL (REFLEXED) - BCID2

## 2020-05-02 LAB — URINALYSIS, ROUTINE W REFLEX MICROSCOPIC
Bacteria, UA: NONE SEEN
Bilirubin Urine: NEGATIVE
Glucose, UA: NEGATIVE mg/dL
Ketones, ur: NEGATIVE mg/dL
Nitrite: NEGATIVE
Protein, ur: NEGATIVE mg/dL
Specific Gravity, Urine: 1.003 — ABNORMAL LOW (ref 1.005–1.030)
pH: 6 (ref 5.0–8.0)

## 2020-05-02 LAB — CBC WITH DIFFERENTIAL/PLATELET
Abs Immature Granulocytes: 0.29 10*3/uL — ABNORMAL HIGH (ref 0.00–0.07)
Basophils Absolute: 0 10*3/uL (ref 0.0–0.1)
Basophils Relative: 0 %
Eosinophils Absolute: 0 10*3/uL (ref 0.0–0.5)
Eosinophils Relative: 0 %
HCT: 31.9 % — ABNORMAL LOW (ref 36.0–46.0)
Hemoglobin: 10.3 g/dL — ABNORMAL LOW (ref 12.0–15.0)
Immature Granulocytes: 2 %
Lymphocytes Relative: 4 %
Lymphs Abs: 0.7 10*3/uL (ref 0.7–4.0)
MCH: 32.7 pg (ref 26.0–34.0)
MCHC: 32.3 g/dL (ref 30.0–36.0)
MCV: 101.3 fL — ABNORMAL HIGH (ref 80.0–100.0)
Monocytes Absolute: 1.9 10*3/uL — ABNORMAL HIGH (ref 0.1–1.0)
Monocytes Relative: 11 %
Neutro Abs: 13.9 10*3/uL — ABNORMAL HIGH (ref 1.7–7.7)
Neutrophils Relative %: 83 %
Platelets: 146 10*3/uL — ABNORMAL LOW (ref 150–400)
RBC: 3.15 MIL/uL — ABNORMAL LOW (ref 3.87–5.11)
RDW: 12.6 % (ref 11.5–15.5)
WBC: 16.8 10*3/uL — ABNORMAL HIGH (ref 4.0–10.5)
nRBC: 0 % (ref 0.0–0.2)

## 2020-05-02 LAB — BASIC METABOLIC PANEL
Anion gap: 10 (ref 5–15)
BUN: 32 mg/dL — ABNORMAL HIGH (ref 8–23)
CO2: 21 mmol/L — ABNORMAL LOW (ref 22–32)
Calcium: 8.4 mg/dL — ABNORMAL LOW (ref 8.9–10.3)
Chloride: 101 mmol/L (ref 98–111)
Creatinine, Ser: 1.66 mg/dL — ABNORMAL HIGH (ref 0.44–1.00)
GFR, Estimated: 30 mL/min — ABNORMAL LOW (ref >60)
Glucose, Bld: 109 mg/dL — ABNORMAL HIGH (ref 70–99)
Potassium: 3.5 mmol/L (ref 3.5–5.1)
Sodium: 132 mmol/L — ABNORMAL LOW (ref 135–145)

## 2020-05-02 LAB — SARS CORONAVIRUS 2 (TAT 6-24 HRS): SARS Coronavirus 2: NEGATIVE

## 2020-05-02 LAB — MAGNESIUM: Magnesium: 1.7 mg/dL (ref 1.7–2.4)

## 2020-05-02 MED ORDER — MAGNESIUM SULFATE IN D5W 1-5 GM/100ML-% IV SOLN
1.0000 g | Freq: Once | INTRAVENOUS | Status: AC
  Administered 2020-05-02: 1 g via INTRAVENOUS
  Filled 2020-05-02 (×2): qty 100

## 2020-05-02 MED ORDER — SODIUM CHLORIDE 0.9 % IV SOLN
500.0000 mg | Freq: Two times a day (BID) | INTRAVENOUS | Status: DC
  Administered 2020-05-03: 500 mg via INTRAVENOUS
  Filled 2020-05-02: qty 0.5
  Filled 2020-05-02: qty 500
  Filled 2020-05-02 (×2): qty 0.5

## 2020-05-02 MED ORDER — POTASSIUM CHLORIDE 10 MEQ/100ML IV SOLN
10.0000 meq | INTRAVENOUS | Status: AC
  Administered 2020-05-02 (×3): 10 meq via INTRAVENOUS
  Filled 2020-05-02 (×3): qty 100

## 2020-05-02 MED ORDER — FLUCONAZOLE 100 MG PO TABS
50.0000 mg | ORAL_TABLET | Freq: Every day | ORAL | Status: DC
  Administered 2020-05-02 – 2020-05-06 (×5): 50 mg via ORAL
  Filled 2020-05-02 (×2): qty 0.5
  Filled 2020-05-02 (×6): qty 1
  Filled 2020-05-02: qty 0.5
  Filled 2020-05-02: qty 1
  Filled 2020-05-02: qty 0.5

## 2020-05-02 MED ORDER — SIMETHICONE 80 MG PO CHEW: 80.0000 mg | CHEWABLE_TABLET | Freq: Four times a day (QID) | ORAL | Status: DC | PRN

## 2020-05-02 MED ORDER — LORAZEPAM 0.5 MG PO TABS
0.5000 mg | ORAL_TABLET | Freq: Two times a day (BID) | ORAL | Status: DC | PRN
  Administered 2020-05-02 – 2020-05-05 (×4): 0.5 mg via ORAL
  Filled 2020-05-02 (×4): qty 1

## 2020-05-02 MED ORDER — POLYVINYL ALCOHOL 1.4 % OP SOLN: 1.0000 [drp] | OPHTHALMIC | Status: DC | PRN

## 2020-05-02 MED ORDER — PHENELZINE SULFATE 15 MG PO TABS
15.0000 mg | ORAL_TABLET | Freq: Two times a day (BID) | ORAL | Status: DC
  Administered 2020-05-02 – 2020-05-06 (×8): 15 mg via ORAL
  Filled 2020-05-02 (×15): qty 1

## 2020-05-02 MED ORDER — ENOXAPARIN SODIUM 30 MG/0.3ML ~~LOC~~ SOLN
30.0000 mg | SUBCUTANEOUS | Status: DC
  Administered 2020-05-02 – 2020-05-06 (×5): 30 mg via SUBCUTANEOUS
  Filled 2020-05-02 (×5): qty 0.3

## 2020-05-02 MED ORDER — CHLORHEXIDINE GLUCONATE CLOTH 2 % EX PADS
6.0000 | MEDICATED_PAD | Freq: Every day | CUTANEOUS | Status: DC
  Administered 2020-05-03 – 2020-05-05 (×3): 6 via TOPICAL

## 2020-05-02 MED ORDER — ACETAMINOPHEN 325 MG PO TABS
650.0000 mg | ORAL_TABLET | Freq: Once | ORAL | Status: AC
  Administered 2020-05-02: 650 mg via ORAL
  Filled 2020-05-02: qty 2

## 2020-05-02 MED ORDER — SODIUM CHLORIDE 0.9 % IV SOLN
1.0000 g | Freq: Once | INTRAVENOUS | Status: AC
  Administered 2020-05-02: 1 g via INTRAVENOUS
  Filled 2020-05-02: qty 1

## 2020-05-02 MED ORDER — ONDANSETRON HCL 4 MG PO TABS: 4.0000 mg | ORAL_TABLET | Freq: Four times a day (QID) | ORAL | Status: DC | PRN

## 2020-05-02 MED ORDER — ARIPIPRAZOLE 5 MG PO TABS
5.0000 mg | ORAL_TABLET | Freq: Every day | ORAL | Status: DC
  Administered 2020-05-03 – 2020-05-06 (×4): 5 mg via ORAL
  Filled 2020-05-02 (×5): qty 1

## 2020-05-02 MED ORDER — ACETAMINOPHEN 650 MG RE SUPP: 650.0000 mg | Freq: Four times a day (QID) | RECTAL | Status: DC | PRN

## 2020-05-02 MED ORDER — ONDANSETRON HCL 4 MG/2ML IJ SOLN: 4.0000 mg | Freq: Four times a day (QID) | INTRAMUSCULAR | Status: DC | PRN

## 2020-05-02 MED ORDER — METOPROLOL SUCCINATE ER 25 MG PO TB24
25.0000 mg | ORAL_TABLET | Freq: Every day | ORAL | Status: DC
  Administered 2020-05-02 – 2020-05-06 (×5): 25 mg via ORAL
  Filled 2020-05-02 (×6): qty 1

## 2020-05-02 MED ORDER — LEVOTHYROXINE SODIUM 75 MCG PO TABS
75.0000 ug | ORAL_TABLET | Freq: Every day | ORAL | Status: DC
  Administered 2020-05-02 – 2020-05-06 (×5): 75 ug via ORAL
  Filled 2020-05-02 (×5): qty 1

## 2020-05-02 MED ORDER — LACTATED RINGERS IV BOLUS (SEPSIS)
1000.0000 mL | Freq: Once | INTRAVENOUS | Status: AC
  Administered 2020-05-02: 1000 mL via INTRAVENOUS

## 2020-05-02 MED ORDER — PANTOPRAZOLE SODIUM 40 MG PO TBEC
40.0000 mg | DELAYED_RELEASE_TABLET | Freq: Every day | ORAL | Status: DC
  Administered 2020-05-02 – 2020-05-06 (×5): 40 mg via ORAL
  Filled 2020-05-02 (×7): qty 1

## 2020-05-02 MED ORDER — ACETAMINOPHEN 325 MG PO TABS
650.0000 mg | ORAL_TABLET | Freq: Four times a day (QID) | ORAL | Status: DC | PRN
  Administered 2020-05-02 – 2020-05-06 (×7): 650 mg via ORAL
  Filled 2020-05-02 (×8): qty 2

## 2020-05-02 NOTE — Progress Notes (Signed)
Critical Lab: blood cultures positive for Enterobacterales represent a large order of gram negative bacteria/ reported to on call provider via amion at this time

## 2020-05-02 NOTE — Progress Notes (Signed)
History and Physical    Erica [name] FBX:[id number] DOB: May 14, 1932 DOA: 05/01/2020  PCP: [name], MD  Patient coming from: Home.  I have personally briefly reviewed patient's old medical records in [place]  Chief Complaint: Fever.  HPI: Erica [name] is a 87 y.o. female with medical history significant of anxiety, bipolar disorder, hyperlipidemia, hypothyroidism, IBS, rheumatoid arthritis, hypothyroidism, GERD/hiatal hernia, history of frequent UTI who is coming to the emergency department due to fever for the past 4 to 5 days associated with chills, night sweats, fatigue, frontal headache, malaise, decreased appetite and overall oral intake.  She denies flank pain or dysuria, but has suprapubic discomfort and urinary frequency.  She saw GYN yesterday (Dr. Tania Ade) who prescribed 2 doses of fosfomycin for her after talking to infectious diseases (Dr. Juleen China).  However, the patient's symptoms have persisted despite taking the fosfomycin.  She denies rhinorrhea, sore throat, productive cough, wheezing or hemoptysis.  No chest pain, palpitations, PND, orthopnea or or recent pitting edema of the lower extremities.  The patient gets occasionally lightheaded.  Denies emesis, diarrhea, constipation, melena hematochezia.  No polyuria, polydipsia, polyphagia or blurred vision.  ED Course: Initial vital signs were temperature 98.2 F, pulse 121, respirations 16, BP 105/55 mmHg O2 sat 94% on room air.  The patient received a 500 mL normal saline bolus in the emergency department and 650 mg of acetaminophen.  Labwork: CBC showed a white count 20.2 with 79% neutrophils, hemoglobin 11.2 g/dL with an MCV of 100.9 fL and platelets 160.  Lactic acid was 1.8 mmol/L.  Lipase was normal.  Sodium 130, potassium 3.2, chloride 99 and CO2 21 mmol/L.  Calcium was 8.3, glucose 128, BUN 35 and creatinine 2.23 mmol/L.  Her calcium level normalizes after correction to albumin.  Hepatic function was  normal, except for an albumin level of 3.0 g/dL.  Imaging: Portable chest radiograph did not show any active disease.  CT abdomen/pelvis without contrast showed mild bilateral hydronephrosis and proximal hydroureter ureter, without evidence of obstructing renal calculi.  There is a 3 mm nonobstructing renal stone within the mid left kidney.  There is bilateral perinephric inflammatory fat stranding which may represent sequela associated with acute pyonephritis.  There is a large, stable hiatal hernia.  Aortic atherosclerosis.  Please see images and full radiology report for further detail.  Review of Systems: As per HPI otherwise all other systems reviewed and are negative.  Past Medical History:  Diagnosis Date  . Anxiety   . Bipolar disorder ([place])   . Hyperlipidemia   . Hypothyroidism   . Irritable bowel syndrome   . RA (rheumatoid arthritis) ([place])    Past Surgical History:  Procedure Laterality Date  . ABDOMINAL HYSTERECTOMY    . Bone Spurs  08/07/13   Right Shoulder  . COLONOSCOPY    . COLONOSCOPY N/A 05/08/2012   Procedure: COLONOSCOPY;  Surgeon: Rogene Houston, MD;  Location: [facility];  Service: Endoscopy;  Laterality: N/A;  730  . COLOSTOMY    . ESOPHAGOGASTRODUODENOSCOPY N/A 11/04/2013   Procedure: ESOPHAGOGASTRODUODENOSCOPY (EGD);  Surgeon: Rogene Houston, MD;  Location: [facility];  Service: Endoscopy;  Laterality: N/A;  730  . EYE SURGERY  cataract x 2  . MALONEY DILATION N/A 11/04/2013   Procedure: Venia Minks DILATION;  Surgeon: Rogene Houston, MD;  Location: [facility];  Service: Endoscopy;  Laterality: N/A;  . UPPER GASTROINTESTINAL ENDOSCOPY     Social History  reports that she quit  smoking about 39 years ago. Her smoking use included cigarettes. She has a 40.50 pack-year smoking history. She quit smokeless tobacco use about 39 years ago. She reports current alcohol use of about 1.0 standard drink of alcohol per week. She reports that she does not use  drugs.  Allergies  Allergen Reactions  . Penicillins Itching and Rash  . Bactrim [Sulfamethoxazole-Trimethoprim] Nausea Only    And diarrhea.  . Hydrocodone     MAO-I ; cannot take medications.   . Morphine And Related     Unable to take due to MAO-I   Family History  Problem Relation Age of Onset  . Colon cancer Mother   . Bipolar disorder Mother   . Anxiety disorder Mother   . Atrial fibrillation Son   . Depression Maternal Grandmother    Prior to Admission medications   Medication Sig Start Date End Date Taking? Authorizing Provider  acetaminophen (TYLENOL) 650 MG CR tablet Take 1,300 mg by mouth daily.    [provider]  Cholecalciferol 25 MCG (1000 UT) tablet Take by mouth daily. 02/27/20   [provider]  Cyanocobalamin (VITAMIN B-12 PO) Take by mouth daily.    [provider]  fluconazole (DIFLUCAN) 50 MG tablet Take 1 tablet (50 mg total) by mouth daily. 04/06/20   Florian Buff, MD  fosfomycin (MONUROL) 3 g PACK 1 tablet today, repeat 1 tablet in 3 days 04/10/20   Florian Buff, MD  LORazepam (ATIVAN) 0.5 MG tablet 1 tablet    [provider]  losartan (COZAAR) 50 MG tablet Take 75 mg by mouth daily.  07/02/19   [provider]  metoprolol succinate (TOPROL-XL) 25 MG 24 hr tablet Take 25 mg by mouth daily. 03/26/20   [provider]  Multiple Vitamins-Minerals (CENTRUM SILVER 50+WOMEN) TABS Take 1 tablet by mouth.    [provider]  ondansetron (ZOFRAN) 4 MG tablet Take 1 tablet (4 mg total) by mouth every 8 (eight) hours as needed for nausea or vomiting. 12/26/19   Idol, Almyra Free, PA-C  pantoprazole (PROTONIX) 40 MG tablet Take 1 tablet (40 mg total) by mouth daily before breakfast. 04/01/20   Rehman, Mechele Dawley, MD  phenelzine (NARDIL) 15 MG tablet Take 15 mg by mouth 2 (two) times daily. 02/26/20   [provider]  polyethylene glycol powder (GLYCOLAX/MIRALAX) powder 17 gram po qd as directed. 09/20/17    [name], MD  polyvinyl alcohol (LIQUIFILM TEARS) 1.4 % ophthalmic solution Place one drop into both eyes 4 (four) times a day as needed for up to 30 days. 02/26/20   [provider]  Probiotic, Lactobacillus, CAPS Take 1 capsule by mouth daily.    [provider]  Simethicone (PHAZYME ULTRA STRENGTH) 180 MG CAPS Take 1 capsule (180 mg total) by mouth 3 (three) times daily after meals. 01/07/20   Rehman, Mechele Dawley, MD  SYNTHROID 75 MCG tablet Take 1 tablet (75 mcg total) by mouth daily. 09/20/17   [name], MD  terconazole (TERAZOL 7) 0.4 % vaginal cream Place 1 applicator vaginally at bedtime. 04/06/20   Florian Buff, MD    Physical Exam: Vitals:   05/01/20 2200 05/01/20 2230 05/01/20 2350 05/02/20 0000  BP: (!) 95/57 (!) 95/57 (!) 104/55 119/68  Pulse: (!) 102 98 (!) 102 (!) 108  Resp: _0 Temp:      TempSrc:      SpO2: 94% 94% 95% 94%  Weight:  Height:        Constitutional: Looks chronically ill. Eyes: PERRL, lids and conjunctivae normal ENMT: Mucous membranes and lips are dry. Posterior pharynx clear of any exudate or lesions. Neck: normal, supple, no masses, no thyromegaly Respiratory: Decreased breath sounds in bases, otherwise clear to auscultation bilaterally, no wheezing, no crackles. Normal respiratory effort. No accessory muscle use.  Cardiovascular: Tachycardic at 108 bpm with a regular rhythm, no murmurs / rubs / gallops. No extremity edema. 2+ pedal pulses. No carotid bruits.  Abdomen: No distention. Bowel sounds positive.  Positive suprapubic tenderness, no masses palpated. No hepatosplenomegaly.  Musculoskeletal: no clubbing / cyanosis.  Moderate generalized weakness.  Positive swan-neck and boutonniere deformities.  Decreased MCP joints ROM, no contractures. Normal muscle tone.  Skin: Some iris of ecchymosis on upper extremities. Neurologic: CN 2-12 grossly intact. Sensation intact, DTR normal. Strength 5/5 in all 4.   Psychiatric: Normal judgment and insight. Alert and oriented x 3. Normal mood.   Labs on Admission: I have personally reviewed following labs and imaging studies  CBC: Recent Labs  Lab 05/01/20 2258  WBC 20.2*  NEUTROABS 16.2*  HGB 11.2*  HCT 34.4*  MCV 100.9*  PLT 242    Basic Metabolic Panel: Recent Labs  Lab 05/01/20 2258  NA 130*  K 3.2*  CL 99  CO2 21*  GLUCOSE 128*  BUN 35*  CREATININE 2.23*  CALCIUM 8.3*    GFR: Estimated Creatinine Clearance: 13.2 mL/min (A) (by C-G formula based on SCr of 2.23 mg/dL (H)).  Liver Function Tests: Recent Labs  Lab 05/01/20 2258  AST 21  ALT 16  ALKPHOS 77  BILITOT 0.8  PROT 6.5  ALBUMIN 3.0*    Radiological Exams on Admission: CT Abdomen Pelvis Wo Contrast  Result Date: 05/02/2020 CLINICAL DATA:  Fever and recent urinary tract infection. EXAM: CT ABDOMEN AND PELVIS WITHOUT CONTRAST TECHNIQUE: Multidetector CT imaging of the abdomen and pelvis was performed following the standard protocol without IV contrast. COMPARISON:  December 26, 2019 FINDINGS: Lower chest: Mild linear scarring and/or atelectasis is seen within the bilateral lung bases. Hepatobiliary: No focal liver abnormality is seen. No gallstones, gallbladder wall thickening, or biliary dilatation. Pancreas: Unremarkable. No pancreatic ductal dilatation or surrounding inflammatory changes. Spleen: Normal in size without focal abnormality. Adrenals/Urinary Tract: Adrenal glands are unremarkable. Kidneys are normal in size, without obstructing renal calculi or focal lesions. Mild bilateral hydronephrosis and proximal hydroureter are seen. A 3 mm nonobstructing renal stone is seen within the posterior aspect of the mid left kidney. Mild, bilateral perinephric inflammatory fat stranding is seen. This represents a new finding when compared to the prior study. Bladder is unremarkable. Stomach/Bowel: There is a large, stable hiatal hernia. The appendix is not visualized.  Surgically anastomosed bowel is seen within the expected region of the proximal sigmoid colon. No evidence of bowel wall thickening, distention, or inflammatory changes. Vascular/Lymphatic: Aortic atherosclerosis. No enlarged abdominal or pelvic lymph nodes. Reproductive: Status post hysterectomy. No adnexal masses. Other: No abdominal wall hernia or abnormality. No abdominopelvic ascites. Musculoskeletal: There is marked severity levoscoliosis of the lumbar spine with multilevel degenerative changes. IMPRESSION: 1. Mild bilateral hydronephrosis and proximal hydroureter, without evidence of obstructing renal calculi. 2. 3 mm nonobstructing renal stone within the mid left kidney. 3. Bilateral perinephric inflammatory fat stranding which may represent sequelae associated with acute pyelonephritis. 4. Large, stable hiatal hernia. 5. Aortic atherosclerosis. Aortic Atherosclerosis (ICD10-I70.0). Electronically Signed   By: Virgina Norfolk M.D.   On: 05/02/2020 02:04  DG Chest Port 1 View  Result Date: 05/01/2020 CLINICAL DATA:  Fever EXAM: PORTABLE CHEST 1 VIEW COMPARISON:  05/07/2017 FINDINGS: The heart size and mediastinal contours are within normal limits. Both lungs are clear. The visualized skeletal structures are unremarkable. IMPRESSION: No active disease. Electronically Signed   By: Ulyses Jarred M.D.   On: 05/01/2020 23:23    EKG: Independently reviewed.   Assessment/Plan Principal Problem:   Sepsis secondary to UTI POA ([place]) Recent culture growing ESBL E. coli. Sepsis criteria met: Fever Heart rate more than 90 bpm Leukocytosis more than 20,000. Source of infection urinary tract. Admit to telemetry/inpatient. Continue gentle IV hydration. Meropenem per pharmacy. Follow blood culture and sensitivity. Follow-up urine culture and sensitivity.  Active Problems:  Dehydration  AKI (acute kidney injury) ([place])  B/L hydronephrosis. No obvious obstruction. Continue IV fluids. Check  urinalysis. Avoid nephrotoxic medications. Avoid hypotensive episodes. Hold losartan. Follow-up renal function electrolytes.    Hypotension Secondary to volume depletion. Hold losartan. Continue IV fluids. Monitor blood pressure.    Hypertension Hold antihypertensives. Monitor blood pressure.    Hypothyroidism Continue levothyroxine 75 mcg p.o. daily.    Hyponatremia GI losses? Hypotonic fluid oral hydration? Continue IVF. Follow-up sodium level.    Hypokalemia Replacing Follow-up potassium level.    Macrocytic anemia Monitor H&H. Check L93 folic acid level.    Protein-calorie malnutrition, mild ([facility]) No obvious muscle wasting. BMI is 23.24 kg/m. Has moderate generalized weakness. Nutritional services consulted.    GERD (gastroesophageal reflux disease) Continue pantoprazole 40 mg p.o. daily before breakfast.    Hyperlipidemia/aortic atherosclerosis. Not on medical therapy at this time. May benefit from statin therapy. She will follow-up with her PCP.   DVT prophylaxis: Lovenox SQ. Code Status:   Full code. Family Communication:   Disposition Plan:   Patient is from:  Home.  Anticipated DC to:  Home.  Anticipated DC date:  05/05/2020  Anticipated DC barriers: Clinical status.  Consults called: Admission status:  Inpatient/telemetry.   Severity of Illness: Very high due to her advanced age and sepsis picture presentation.  The patient also has dehydration, with AKI, hydronephrosis, multiple electrolyte abnormalities and malnutrition.  The patient will at least require 48 to 72 hours of inpatient treatment and monitoring.  Reubin Milan MD [facility]  How to contact the [facility] Attending or Consulting provider [place] or covering provider during after hours [place], for this patient?   1. Check the care team in [facility] and look for a) attending/consulting [facility] provider listed and b) the [facility] team listed 2. Log into [email or web address] and use [facility]'s universal password to access. If you do not have the password, please contact the hospital operator. 3. Locate the [facility] provider you are looking for under [facility] and page to a number that you can be directly reached. 4. If you still have difficulty reaching the provider, please page the [facility] (Director on Call) for the Hospitalists listed on amion for assistance.  05/02/2020, 2:45 AM   This document was prepared using Dragon voice recognition software and may contain some unintended transcription errors.

## 2020-05-02 NOTE — Progress Notes (Addendum)
Pharmacy Antibiotic Note  Erica [name] is a 87 y.o. female admitted on 05/01/2020 likely with pyelonephritis. Pharmacy has been consulted for meropenem dosing. Pt with AKI - Scr 2.23 (baseline 0.74). Pt with ESBL E. Coli UTI ~3 wks ago. Pt received fosfomycin x 2 doses at this time.  Plan: Give meropenem 500mg  IV q12h F/u cultures Monitor renal function   Height: 5' (152.4 cm) Weight: 54 kg (119 lb) IBW/kg (Calculated) : 45.5  Temp (24hrs), Avg:98.2 F (36.8 C), Min:98.2 F (36.8 C), Max:98.2 F (36.8 C)  Recent Labs  Lab 05/01/20 2258  WBC 20.2*  CREATININE 2.23*  LATICACIDVEN 1.8    Estimated Creatinine Clearance: 13.2 mL/min (A) (by C-G formula based on SCr of 2.23 mg/dL (H)).    Allergies  Allergen Reactions   Penicillins Itching and Rash   Bactrim [Sulfamethoxazole-Trimethoprim] Nausea Only    And diarrhea.   Hydrocodone     MAO-I ; cannot take medications.    Morphine And Related     Unable to take due to MAO-I    Antimicrobials this admission: 2/19 meropenem >> 2/19 fluconazole >>   Microbiology results: 2/19 BCx: pending   Thank you for allowing pharmacy to be a part of this patient's care.  3/19, PharmD Student 05/02/2020 2:41 AM  I discussed / reviewed the pharmacy note by Ms. 05/04/2020 and I agree with the student's findings and plans as documented.  Willette Alma, PharmD, BCPS Please see amion for complete clinical pharmacist phone list 05/02/2020 3:05 AM

## 2020-05-02 NOTE — ED Notes (Signed)
Pt cleaned and bed linen changed. New purewick placed

## 2020-05-02 NOTE — ED Notes (Signed)
Pt to CT

## 2020-05-02 NOTE — Progress Notes (Signed)
[facility] night shift.  The staff reported positive blood cultures with Enterobacteriaceae bacteria, specifically E. coli. She has a history of ESBL E. coli UTI episodes with extensive resistance to antibiotics, but still sensitive to carbapenem class drugs. She is currently receiving meropenem per pharmacy dosing. We will follow the sensitivity results and continue current therapy at this time.  Sanda Klein, MD.

## 2020-05-02 NOTE — Progress Notes (Signed)
ASSUMPTION OF CARE NOTE   05/02/2020 1:32 PM  Erica [name] was seen and examined.  The H&P by the admitting provider, orders, imaging was reviewed.  Please see new orders.  Will continue to follow.    Vitals:   05/02/20 0330 05/02/20 0506  BP: (!) 107/51 123/72  Pulse:  (!) 108  Resp: 17 18  Temp:  98.3 F (36.8 C)  SpO2:  95%    Results for orders placed or performed during the hospital encounter of 05/01/20  Culture, blood (Routine X 2) w Reflex to ID Panel   Specimen: Left Antecubital; Blood  Result Value Ref Range   Specimen Description LEFT ANTECUBITAL    Special Requests      BOTTLES DRAWN AEROBIC AND ANAEROBIC Blood Culture adequate volume   Culture      NO GROWTH < 12 HOURS Performed at [facility], [address], [place], [place] [address]    Report Status PENDING   Culture, blood (Routine X 2) w Reflex to ID Panel   Specimen: BLOOD RIGHT ARM  Result Value Ref Range   Specimen Description BLOOD RIGHT ARM    Special Requests      BOTTLES DRAWN AEROBIC ONLY Blood Culture adequate volume   Culture      NO GROWTH < 12 HOURS Performed at [facility], [address] [place], [place] [address]    Report Status PENDING   Lactic acid, plasma  Result Value Ref Range   Lactic Acid, Venous 1.8 0.5 - 1.9 mmol/L  CBC with Differential/Platelet  Result Value Ref Range   WBC 20.2 (H) 4.0 - 10.5 K/uL   RBC 3.41 (L) 3.87 - 5.11 MIL/uL   Hemoglobin 11.2 (L) 12.0 - 15.0 g/dL   HCT 66.0 (L) 63.0 - 16.0 %   MCV 100.9 (H) 80.0 - 100.0 fL   MCH 32.8 26.0 - 34.0 pg   MCHC 32.6 30.0 - 36.0 g/dL   RDW 10.9 32.3 - 55.7 %   Platelets 160 150 - 400 K/uL   nRBC 0.0 0.0 - 0.2 %   Neutrophils Relative % 79 %   Neutro Abs 16.2 (H) 1.7 - 7.7 K/uL   Lymphocytes Relative 6 %   Lymphs Abs 1.2 0.7 - 4.0 K/uL   Monocytes Relative 11 %   Monocytes Absolute 2.1 (H) 0.1 - 1.0 K/uL   Eosinophils Relative 1 %   Eosinophils Absolute 0.2 0.0 - 0.5 K/uL   Basophils Relative 0 %    Basophils Absolute 0.1 0.0 - 0.1 K/uL   Immature Granulocytes 3 %   Abs Immature Granulocytes 0.50 (H) 0.00 - 0.07 K/uL  Comprehensive metabolic panel  Result Value Ref Range   Sodium 130 (L) 135 - 145 mmol/L   Potassium 3.2 (L) 3.5 - 5.1 mmol/L   Chloride 99 98 - 111 mmol/L   CO2 21 (L) 22 - 32 mmol/L   Glucose, Bld 128 (H) 70 - 99 mg/dL   BUN 35 (H) 8 - 23 mg/dL   Creatinine, Ser 3.22 (H) 0.44 - 1.00 mg/dL   Calcium 8.3 (L) 8.9 - 10.3 mg/dL   Total Protein 6.5 6.5 - 8.1 g/dL   Albumin 3.0 (L) 3.5 - 5.0 g/dL   AST 21 15 - 41 U/L   ALT 16 0 - 44 U/L   Alkaline Phosphatase 77 38 - 126 U/L   Total Bilirubin 0.8 0.3 - 1.2 mg/dL   GFR, Estimated 21 (L) >60 mL/min   Anion gap 10  5 - 15  Lipase, blood  Result Value Ref Range   Lipase 19 11 - 51 U/L  CBC with Differential  Result Value Ref Range   WBC 16.8 (H) 4.0 - 10.5 K/uL   RBC 3.15 (L) 3.87 - 5.11 MIL/uL   Hemoglobin 10.3 (L) 12.0 - 15.0 g/dL   HCT 61.6 (L) 07.3 - 71.0 %   MCV 101.3 (H) 80.0 - 100.0 fL   MCH 32.7 26.0 - 34.0 pg   MCHC 32.3 30.0 - 36.0 g/dL   RDW 62.6 94.8 - 54.6 %   Platelets 146 (L) 150 - 400 K/uL   nRBC 0.0 0.0 - 0.2 %   Neutrophils Relative % 83 %   Neutro Abs 13.9 (H) 1.7 - 7.7 K/uL   Lymphocytes Relative 4 %   Lymphs Abs 0.7 0.7 - 4.0 K/uL   Monocytes Relative 11 %   Monocytes Absolute 1.9 (H) 0.1 - 1.0 K/uL   Eosinophils Relative 0 %   Eosinophils Absolute 0.0 0.0 - 0.5 K/uL   Basophils Relative 0 %   Basophils Absolute 0.0 0.0 - 0.1 K/uL   Immature Granulocytes 2 %   Abs Immature Granulocytes 0.29 (H) 0.00 - 0.07 K/uL  Magnesium  Result Value Ref Range   Magnesium 1.7 1.7 - 2.4 mg/dL  Basic metabolic panel  Result Value Ref Range   Sodium 132 (L) 135 - 145 mmol/L   Potassium 3.5 3.5 - 5.1 mmol/L   Chloride 101 98 - 111 mmol/L   CO2 21 (L) 22 - 32 mmol/L   Glucose, Bld 109 (H) 70 - 99 mg/dL   BUN 32 (H) 8 - 23 mg/dL   Creatinine, Ser 2.70 (H) 0.44 - 1.00 mg/dL   Calcium 8.4 (L) 8.9 -  10.3 mg/dL   GFR, Estimated 30 (L) >60 mL/min   Anion gap 10 5 - 15  Urinalysis, Routine w reflex microscopic Urine, Clean Catch  Result Value Ref Range   Color, Urine STRAW (A) YELLOW   APPearance CLEAR CLEAR   Specific Gravity, Urine 1.003 (L) 1.005 - 1.030   pH 6.0 5.0 - 8.0   Glucose, UA NEGATIVE NEGATIVE mg/dL   Hgb urine dipstick MODERATE (A) NEGATIVE   Bilirubin Urine NEGATIVE NEGATIVE   Ketones, ur NEGATIVE NEGATIVE mg/dL   Protein, ur NEGATIVE NEGATIVE mg/dL   Nitrite NEGATIVE NEGATIVE   Leukocytes,Ua SMALL (A) NEGATIVE   RBC / HPF 0-5 0 - 5 RBC/hpf   WBC, UA 0-5 0 - 5 WBC/hpf   Bacteria, UA NONE SEEN NONE Alfredia Ferguson, MD [facility]   05/01/2020  9:35 PM How to contact the [facility] Attending or Consulting provider 7A - 7P or covering provider during after hours 7P -7A, for this patient?  1. Check the care team in [facility] and look for a) attending/consulting TR[facility] provider listed and b) the [facility] team listed 2. Log into [email or web address] and use [place]'s universal password to access. If you do not have the password, please contact the hospital operator. 3. Locate the [facility] provider you are looking for under [facility] and page to a number that you can be directly reached. 4. If you still have difficulty reaching the provider, please page the [facility] (Director on Call) for the Hospitalists listed on amion for assistance.

## 2020-05-02 NOTE — Progress Notes (Signed)
Critical Lab value: Positive blood cultures Dr. Laural Benes notified 05/03/19 at 1710 See new orders

## 2020-05-02 NOTE — ED Provider Notes (Signed)
I assumed care in signout to follow-up on imaging.  Patient found to have acute kidney injury.  CT imaging reveals likely pyelonephritis.  No obstructing stones. Discussed with Dr. Robb Matar for admission to the hospitalist service. Will defer to admitting physician and pharmacy for appropriate antibiotics as she has already failed outpatient therapy with fosfomycin and has multiple antibiotic allergy   Zadie Rhine, MD 05/02/20 0225

## 2020-05-03 DIAGNOSIS — R7881 Bacteremia: Secondary | ICD-10-CM

## 2020-05-03 DIAGNOSIS — E871 Hypo-osmolality and hyponatremia: Secondary | ICD-10-CM

## 2020-05-03 DIAGNOSIS — K219 Gastro-esophageal reflux disease without esophagitis: Secondary | ICD-10-CM

## 2020-05-03 DIAGNOSIS — B9620 Unspecified Escherichia coli [E. coli] as the cause of diseases classified elsewhere: Secondary | ICD-10-CM

## 2020-05-03 DIAGNOSIS — E876 Hypokalemia: Secondary | ICD-10-CM

## 2020-05-03 LAB — CBC WITH DIFFERENTIAL/PLATELET
Basophils Absolute: 0 10*3/uL (ref 0.0–0.1)
Basophils Relative: 0 %
Eosinophils Absolute: 0 10*3/uL (ref 0.0–0.5)
Eosinophils Relative: 0 %
HCT: 30.8 % — ABNORMAL LOW (ref 36.0–46.0)
Hemoglobin: 9.6 g/dL — ABNORMAL LOW (ref 12.0–15.0)
Lymphocytes Relative: 7 %
Lymphs Abs: 0.6 10*3/uL — ABNORMAL LOW (ref 0.7–4.0)
MCH: 31.4 pg (ref 26.0–34.0)
MCHC: 31.2 g/dL (ref 30.0–36.0)
MCV: 100.7 fL — ABNORMAL HIGH (ref 80.0–100.0)
Metamyelocytes Relative: 2 %
Monocytes Absolute: 1 10*3/uL (ref 0.1–1.0)
Monocytes Relative: 12 %
Myelocytes: 2 %
Neutro Abs: 6.7 10*3/uL (ref 1.7–7.7)
Neutrophils Relative %: 77 %
Platelets: 146 10*3/uL — ABNORMAL LOW (ref 150–400)
RBC: 3.06 MIL/uL — ABNORMAL LOW (ref 3.87–5.11)
RDW: 13.1 % (ref 11.5–15.5)
WBC: 8.7 10*3/uL (ref 4.0–10.5)
nRBC: 0 % (ref 0.0–0.2)

## 2020-05-03 LAB — URINE CULTURE: Culture: NO GROWTH

## 2020-05-03 LAB — BASIC METABOLIC PANEL
Anion gap: 6 (ref 5–15)
BUN: 17 mg/dL (ref 8–23)
CO2: 22 mmol/L (ref 22–32)
Calcium: 8.5 mg/dL — ABNORMAL LOW (ref 8.9–10.3)
Chloride: 108 mmol/L (ref 98–111)
Creatinine, Ser: 0.93 mg/dL (ref 0.44–1.00)
GFR, Estimated: >60 mL/min (ref >60)
Glucose, Bld: 105 mg/dL — ABNORMAL HIGH (ref 70–99)
Potassium: 3.8 mmol/L (ref 3.5–5.1)
Sodium: 136 mmol/L (ref 135–145)

## 2020-05-03 MED ORDER — SODIUM CHLORIDE 0.9 % IV SOLN
2.0000 g | INTRAVENOUS | Status: DC
  Administered 2020-05-03: 2 g via INTRAVENOUS
  Filled 2020-05-03: qty 20

## 2020-05-03 NOTE — Progress Notes (Addendum)
  Critical labs: Blood culture additional reading  BOTTLES DRAWN AEROBIC ONLY Blood Culture adequate volume    Culture Setup Time GRAM NEGATIVE RODS   Above reading sent to provider via amion @this  time(Dr ()

## 2020-05-03 NOTE — Progress Notes (Signed)
   05/03/20 0933  Assess: MEWS Score  BP 94/61  Pulse Rate (!) 113  Resp 18  Level of Consciousness Alert  SpO2 97 %  O2 Device Nasal Cannula  O2 Flow Rate (L/min) 3 L/min  Assess: MEWS Score  MEWS Temp 0  MEWS Systolic 1  MEWS Pulse 2  MEWS RR 0  MEWS LOC 0  MEWS Score 3  MEWS Score Color Yellow  Assess: if the MEWS score is Yellow or Red  Were vital signs taken at a resting state? Yes  Focused Assessment No change from prior assessment  Early Detection of Sepsis Score *See Row Information* Low  MEWS guidelines implemented *See Row Information* Yes  Treat  MEWS Interventions Administered scheduled meds/treatments;Other (Comment) (MD aware)  Pain Scale 0-10  Pain Score 0  Take Vital Signs  Increase Vital Sign Frequency  Yellow: Q 2hr X 2 then Q 4hr X 2, if remains yellow, continue Q 4hrs  Escalate  MEWS: Escalate Yellow: discuss with charge nurse/RN and consider discussing with provider and RRT  Notify: Charge Nurse/RN  Name of Charge Nurse/RN Notified Chales Abrahams  Date Charge Nurse/RN Notified 05/03/20  Time Charge Nurse/RN Notified [id number]  Notify: Provider  Provider Name/Title Dr. Laural Benes  Date Provider Notified 05/03/20  Time Provider Notified [phone]  Notification Type Page  Notification Reason Other (Comment) (Vital Signs)  Provider response No new orders (at this time)

## 2020-05-03 NOTE — Progress Notes (Signed)
PROGRESS NOTE   Erica [name]  XLK:[id number] DOB: 11-13-1932 DOA: 05/01/2020 PCP: Aliene Beams, MD   Chief Complaint  Patient presents with  . Abdominal Pain   Level of care: Telemetry  Brief Admission History:  87 y.o. female with medical history significant of anxiety, bipolar disorder, hyperlipidemia, hypothyroidism, IBS, rheumatoid arthritis, hypothyroidism, GERD/hiatal hernia, history of frequent UTI who is coming to the emergency department due to fever for the past 4 to 5 days associated with chills, night sweats, fatigue, frontal headache, malaise, decreased appetite and overall oral intake.  She saw GYN yesterday (Dr. Duane Lope) who prescribed 2 doses of fosfomycin for her after talking to infectious diseases (Dr. Earlene Plater).  However, the patient's symptoms have persisted despite taking the fosfomycin.   Assessment & Plan:   Principal Problem:   Sepsis secondary to UTI [facility]) Active Problems:   E coli bacteremia   GERD (gastroesophageal reflux disease)   Hypotension   Dehydration   Hypothyroidism   Hypertension   Hyponatremia   AKI (acute kidney injury) (HCC)   Hyperlipidemia   Hypokalemia   Macrocytic anemia   Protein-calorie malnutrition, mild (HCC)   Acute cystitis without hematuria   1. Sepsis secondary to E coli UTI - Pt responded well to IV meropenem and now de-escalating to  Ceftriaxone as there was no ESBL resistance found on culture.  2. E coli bacteremia - continue ceftriaxone 2 gm IV.  Awaiting final culture and sensitivities.  3. AKI - resolved with IV fluid.  4. Acute cystitis due to E coli - treating as above.  5. Leukocytosis - resolved with treatment.  6. Hypotension - stable.  Soft BPs noted.  Continue IV hydration.  7. GERD - protonix ordered for GI protection.  8. Dehydration - IV fluid given.  9. Hypokalemia - repleted.  Following.  10. Thrombocytopenia - stable, no bleeding.  Follow CBC.   DVT prophylaxis: enoxaparin Code Status: FULL   Family Communication:son at bedside  Disposition: home / TBD Status is: Inpatient  Remains inpatient appropriate because:IV treatments appropriate due to intensity of illness or inability to take PO and Inpatient level of care appropriate due to severity of illness   Dispo: The patient is from: Home              Anticipated d/c is to: Home              Anticipated d/c date is: 2 days              Patient currently is not medically stable to d/c.   Difficult to place patient No   Consultants:   Palliative   Procedures:   N/a   Antimicrobials:  Meropenem 2/19-2/20 Ceftriaxone 2/20>>   Subjective: Pt reports feeling nauseated but no emesis and ate some breakfast.   Objective: Vitals:   05/03/20 0627 05/03/20 0830 05/03/20 0933 05/03/20 1155  BP: 123/62 (!) 149/92 94/61 92/60   Pulse: (!) 101 (!) 116 (!) 113 85  Resp: Temp: 98.2 F (36.8 C)     TempSrc:      SpO2: 97%  97% 96%  Weight:      Height:        Intake/Output Summary (Last 24 hours) at 05/03/2020 1229 Last data filed at 05/02/2020 2100 Gross per 24 hour  Intake 120 ml  Output 700 ml  Net -580 ml   Filed Weights   05/01/20 1830  Weight: 54 kg    Examination:  General  exam: frail elderly female, awake,alert, oriented, Appears calm and comfortable  Respiratory system: Clear to auscultation. Respiratory effort normal. Cardiovascular system: normal S1 & S2 heard. No JVD, murmurs, rubs, gallops or clicks. No pedal edema. Gastrointestinal system: Abdomen is nondistended, soft and nontender. No organomegaly or masses felt. Normal bowel sounds heard. Central nervous system: Alert and oriented. No focal neurological deficits. Extremities: Symmetric 5 x 5 power. Skin: No rashes, lesions or ulcers Psychiatry: Judgement and insight appear normal. Mood & affect appropriate.   Data Reviewed: I have personally reviewed following labs and imaging studies  CBC: Recent Labs  Lab 05/01/20 2258  05/02/20 0548 05/03/20 0612  WBC 20.2* 16.8* 8.7  NEUTROABS 16.2* 13.9* 6.7  HGB 11.2* 10.3* 9.6*  HCT 34.4* 31.9* 30.8*  MCV 100.9* 101.3* 100.7*  PLT 160 146* 146*    Basic Metabolic Panel: Recent Labs  Lab 05/01/20 2258 05/02/20 0548 05/03/20 0612  NA 130* 132* 136  K 3.2* 3.5 3.8  CL 99 101 108  CO2 21* 21* 22  GLUCOSE 128* 109* 105*  BUN 35* 32* 17  CREATININE 2.23* 1.66* 0.93  CALCIUM 8.3* 8.4* 8.5*  MG 1.7  --   --     GFR: Estimated Creatinine Clearance: 31.8 mL/min (by C-G formula based on SCr of 0.93 mg/dL).  Liver Function Tests: Recent Labs  Lab 05/01/20 2258  AST 21  ALT 16  ALKPHOS 77  BILITOT 0.8  PROT 6.5  ALBUMIN 3.0*    CBG: No results for input(s): GLUCAP in the last 168 hours.  Recent Results (from the past 240 hour(s))  Urine Culture     Status: None   Collection Time: 04/24/20  2:25 PM   Specimen: Urine   UR  Result Value Ref Range Status   Urine Culture, Routine Final report  Final   Organism ID, Bacteria Comment  Final    Comment: Greater than 2 organisms recovered, none predominant. Please submit another sample if clinically indicated. Greater than 100,000 colony forming units per mL   Culture, blood (Routine X 2) w Reflex to ID Panel     Status: Abnormal (Preliminary result)   Collection Time: 05/01/20 10:58 PM   Specimen: Left Antecubital; Blood  Result Value Ref Range Status   Specimen Description   Final    LEFT ANTECUBITAL Performed at [facility], [address], [place], [place] [address]    Special Requests   Final    BOTTLES DRAWN AEROBIC AND ANAEROBIC Blood Culture adequate volume Performed at [facility], [address], [place], [place] [address]    Culture  Setup Time   Final    GRAM NEGATIVE RODS AEROBIC BOTTLE ONLY Gram Stain Report Called to,Read Back By and Verified With: BRANDY MCMILLIAN AT 1705 BY HFLYNT 05/02/20 CRITICAL RESULT CALLED TO, READ BACK BY AND VERIFIED WITH: M,CULLEN RN @2043  05/02/20  EB    Culture (A)  Final    ESCHERICHIA COLI SUSCEPTIBILITIES TO FOLLOW Performed at [facility], [address] [place], [place]    Report Status PENDING  Incomplete  Blood Culture ID Panel (Reflexed)     Status: Abnormal   Collection Time: 05/01/20 10:58 PM  Result Value Ref Range Status   Enterococcus faecalis NOT DETECTED NOT DETECTED Final   Enterococcus Faecium NOT DETECTED NOT DETECTED Final   Listeria monocytogenes NOT DETECTED NOT DETECTED Final   Staphylococcus species NOT DETECTED NOT DETECTED Final   Staphylococcus aureus (BCID) NOT DETECTED NOT DETECTED Final  Staphylococcus epidermidis NOT DETECTED NOT DETECTED Final   Staphylococcus lugdunensis NOT DETECTED NOT DETECTED Final   Streptococcus species NOT DETECTED NOT DETECTED Final   Streptococcus agalactiae NOT DETECTED NOT DETECTED Final   Streptococcus pneumoniae NOT DETECTED NOT DETECTED Final   Streptococcus pyogenes NOT DETECTED NOT DETECTED Final   A.calcoaceticus-baumannii NOT DETECTED NOT DETECTED Final   Bacteroides fragilis NOT DETECTED NOT DETECTED Final   Enterobacterales DETECTED (A) NOT DETECTED Final    Comment: Enterobacterales represent a large order of gram negative bacteria, not a single organism. CRITICAL RESULT CALLED TO, READ BACK BY AND VERIFIED WITH: M,CULLEN RN  05/02/20 EB    Enterobacter cloacae complex NOT DETECTED NOT DETECTED Final   Escherichia coli DETECTED (A) NOT DETECTED Final    Comment: CRITICAL RESULT CALLED TO, READ BACK BY AND VERIFIED WITH: M,CULLEN RN  05/02/20 EB    Klebsiella aerogenes NOT DETECTED NOT DETECTED Final   Klebsiella oxytoca NOT DETECTED NOT DETECTED Final   Klebsiella pneumoniae NOT DETECTED NOT DETECTED Final   Proteus species NOT DETECTED NOT DETECTED Final   Salmonella species NOT DETECTED NOT DETECTED Final   Serratia marcescens NOT DETECTED NOT DETECTED Final   Haemophilus influenzae NOT DETECTED NOT DETECTED Final    Neisseria meningitidis NOT DETECTED NOT DETECTED Final   Pseudomonas aeruginosa NOT DETECTED NOT DETECTED Final   Stenotrophomonas maltophilia NOT DETECTED NOT DETECTED Final   Candida albicans NOT DETECTED NOT DETECTED Final   Candida auris NOT DETECTED NOT DETECTED Final   Candida glabrata NOT DETECTED NOT DETECTED Final   Candida krusei NOT DETECTED NOT DETECTED Final   Candida parapsilosis NOT DETECTED NOT DETECTED Final   Candida tropicalis NOT DETECTED NOT DETECTED Final   Cryptococcus neoformans/gattii NOT DETECTED NOT DETECTED Final   CTX-M ESBL NOT DETECTED NOT DETECTED Final   Carbapenem resistance IMP NOT DETECTED NOT DETECTED Final   Carbapenem resistance KPC NOT DETECTED NOT DETECTED Final   Carbapenem resistance NDM NOT DETECTED NOT DETECTED Final   Carbapenem resist OXA 48 LIKE NOT DETECTED NOT DETECTED Final   Carbapenem resistance VIM NOT DETECTED NOT DETECTED Final    Comment: Performed at [facility], [address] [place], [place] [address]  Culture, blood (Routine X 2) w Reflex to ID Panel     Status: None (Preliminary result)   Collection Time: 05/01/20 11:23 PM   Specimen: BLOOD RIGHT ARM  Result Value Ref Range Status   Specimen Description   Final    BLOOD RIGHT ARM Performed at [facility], [address], [place], [place] [address]    Special Requests   Final    BOTTLES DRAWN AEROBIC ONLY Blood Culture adequate volume Performed at [facility], [address], [place], [place] [address]    Culture  Setup Time   Final    GRAM NEGATIVE RODS Gram Stain Report Called to,Read Back By and Verified With: BONDURANT,R @ 0410 ON 05/03/20 BY JUW AEROBIC BOTTLE ONLY GS DONE @ [facility]    Culture   Final    NO GROWTH < 12 HOURS Performed at [facility], [address] [place], [place] [address]    Report Status PENDING  Incomplete  SARS CORONAVIRUS 2 (TAT 6-24 HRS) Nasopharyngeal Nasopharyngeal Swab     Status: None   Collection Time: 05/02/20  3:58 AM    Specimen: Nasopharyngeal Swab  Result Value Ref Range Status   SARS Coronavirus 2 NEGATIVE NEGATIVE Final    Comment: (NOTE) SARS-CoV-2 target nucleic acids  are NOT DETECTED.  The SARS-CoV-2 RNA is generally detectable in upper and lower respiratory specimens during the acute phase of infection. Negative results do not preclude SARS-CoV-2 infection, do not rule out co-infections with other pathogens, and should not be used as the sole basis for treatment or other patient management decisions. Negative results must be combined with clinical observations, patient history, and epidemiological information. The expected result is Negative.  Fact Sheet for Patients: HairSlick.no  Fact Sheet for Healthcare Providers: quierodirigir.com  This test is not yet approved or cleared by the [place] FDA and  has been authorized for detection and/or diagnosis of SARS-CoV-2 by FDA under an Emergency Use Authorization (EUA). This EUA will remain  in effect (meaning this test can be used) for the duration of the COVID-19 declaration under Se ction 564(b)(1) of the Act, 21 U.S.C. section 360bbb-3(b)(1), unless the authorization is terminated or revoked sooner.  Performed at [facility], [address], [place], [place] [address]      Radiology Studies: CT Abdomen Pelvis Wo Contrast  Result Date: 05/02/2020 CLINICAL DATA:  Fever and recent urinary tract infection. EXAM: CT ABDOMEN AND PELVIS WITHOUT CONTRAST TECHNIQUE: Multidetector CT imaging of the abdomen and pelvis was performed following the standard protocol without IV contrast. COMPARISON:  December 26, 2019 FINDINGS: Lower chest: Mild linear scarring and/or atelectasis is seen within the bilateral lung bases. Hepatobiliary: No focal liver abnormality is seen. No gallstones, gallbladder wall thickening, or biliary dilatation. Pancreas: Unremarkable. No pancreatic ductal dilatation  or surrounding inflammatory changes. Spleen: Normal in size without focal abnormality. Adrenals/Urinary Tract: Adrenal glands are unremarkable. Kidneys are normal in size, without obstructing renal calculi or focal lesions. Mild bilateral hydronephrosis and proximal hydroureter are seen. A 3 mm nonobstructing renal stone is seen within the posterior aspect of the mid left kidney. Mild, bilateral perinephric inflammatory fat stranding is seen. This represents a new finding when compared to the prior study. Bladder is unremarkable. Stomach/Bowel: There is a large, stable hiatal hernia. The appendix is not visualized. Surgically anastomosed bowel is seen within the expected region of the proximal sigmoid colon. No evidence of bowel wall thickening, distention, or inflammatory changes. Vascular/Lymphatic: Aortic atherosclerosis. No enlarged abdominal or pelvic lymph nodes. Reproductive: Status post hysterectomy. No adnexal masses. Other: No abdominal wall hernia or abnormality. No abdominopelvic ascites. Musculoskeletal: There is marked severity levoscoliosis of the lumbar spine with multilevel degenerative changes. IMPRESSION: 1. Mild bilateral hydronephrosis and proximal hydroureter, without evidence of obstructing renal calculi. 2. 3 mm nonobstructing renal stone within the mid left kidney. 3. Bilateral perinephric inflammatory fat stranding which may represent sequelae associated with acute pyelonephritis. 4. Large, stable hiatal hernia. 5. Aortic atherosclerosis. Aortic Atherosclerosis (ICD10-I70.0). Electronically Signed   By: Aram Candela M.D.   On: 05/02/2020 02:04   DG Chest Port 1 View  Result Date: 05/01/2020 CLINICAL DATA:  Fever EXAM: PORTABLE CHEST 1 VIEW COMPARISON:  05/07/2017 FINDINGS: The heart size and mediastinal contours are within normal limits. Both lungs are clear. The visualized skeletal structures are unremarkable. IMPRESSION: No active disease. Electronically Signed   By: Deatra Robinson M.D.   On: 05/01/2020 23:23    Scheduled Meds: . ARIPiprazole  5 mg Oral Daily  . Chlorhexidine Gluconate Cloth  6 each Topical Daily  . enoxaparin (LOVENOX) injection  30 mg Subcutaneous Q24H  . fluconazole  50 mg Oral Daily  . levothyroxine  75 mcg Oral Daily  . metoprolol succinate  25 mg Oral Daily  .  pantoprazole  40 mg Oral QAC breakfast  . phenelzine  15 mg Oral BID   Continuous Infusions: . sodium chloride 65 mL/hr at 05/03/20 0949  . cefTRIAXone (ROCEPHIN)  IV 2 g (05/03/20 1159)     LOS: 1 day   Time spent: 40 mins  Dallis Darden Laural Benes, MD How to contact the [facility] Attending or Consulting provider 7A - 7P or covering provider during after hours 7P -7A, for this patient?  1. Check the care team in [facility] and look for a) attending/consulting [facility] provider listed and b) the [facility] team listed 2. Log into [email or web address] and use [place]'s universal password to access. If you do not have the password, please contact the hospital operator. 3. Locate the [facility] provider you are looking for under [facility] and page to a number that you can be directly reached. 4. If you still have difficulty reaching the provider, please page the [facility] (Director on Call) for the Hospitalists listed on amion for assistance.  05/03/2020, 12:29 PM

## 2020-05-04 DIAGNOSIS — Z515 Encounter for palliative care: Secondary | ICD-10-CM

## 2020-05-04 DIAGNOSIS — Z7189 Other specified counseling: Secondary | ICD-10-CM

## 2020-05-04 LAB — GLUCOSE, CAPILLARY: Glucose-Capillary: 110 mg/dL — ABNORMAL HIGH (ref 70–99)

## 2020-05-04 LAB — CULTURE, BLOOD (ROUTINE X 2): Special Requests: ADEQUATE

## 2020-05-04 LAB — URINE CULTURE

## 2020-05-04 MED ORDER — LEVOFLOXACIN IN D5W 500 MG/100ML IV SOLN
500.0000 mg | INTRAVENOUS | Status: DC
  Administered 2020-05-04 – 2020-05-06 (×3): 500 mg via INTRAVENOUS
  Filled 2020-05-04 (×3): qty 100

## 2020-05-04 NOTE — Consult Note (Signed)
Consultation Note Date: 05/04/2020   Patient Name: Erica [name]  DOB: 07-23-1932  MRN: [id number]  Age / Sex: 87 y.o., female  PCP: Aliene Beams, MD Referring Physician: Cleora Fleet, MD  Reason for Consultation: Establishing goals of care  HPI/Patient Profile: 87 y.o. female  with past medical history of anxiety, bipolar disorder, HLD, hypothyroid, IBS, rheumatoid arthritis, GERD, frequent recurrent multi-drug resistant UTI's admitted on 05/01/2020 with sepsis E. Coli UTI/bacteremia refractory to outpatient treatment, blood cultures positive for E. Coli, acute kidney injury r/t dehydration, deconditioning. Palliative medicine consulted for goals of care.     Clinical Assessment and Goals of Care:  I have reviewed medical records including EPIC notes, labs and imaging, and examined the patient. Patient was sleeping heavily. She did not arouse enough to be able to participate in GOC discussion.  Per chart review she lives at home where she has 24/7 caretakers.  Called patient's son. He is working to find an additional caretaker to spend the night with patient before she is discharged, which he believes is set for tomorrow.  Introduced Palliative care.  Plan made to meet tomorrow for further discussion and advanced care planning.    Primary Decision Maker OTHER- patient's son- Rica Ribar    SUMMARY OF RECOMMENDATIONS - Plan to meet with son tomorrow morning at 10am -If patient discharges before then recommend outpatient Palliative referral    Code Status/Advance Care Planning:  Full code  Prognosis:    Unable to determine  Discharge Planning: Home with Home Health  Primary Diagnoses: Present on Admission: . Sepsis secondary to UTI (HCC) . AKI (acute kidney injury) (HCC) . Dehydration . Hypotension . Hyponatremia . GERD (gastroesophageal reflux disease) . Hypothyroidism .  Hypertension . Hyperlipidemia . Hypokalemia . Macrocytic anemia . Protein-calorie malnutrition, mild (HCC) . E coli bacteremia   I have reviewed the medical record, interviewed the patient and family, and examined the patient. The following aspects are pertinent.  Past Medical History:  Diagnosis Date  . Anxiety   . Bipolar disorder (HCC)   . Hyperlipidemia   . Hypothyroidism   . Irritable bowel syndrome   . RA (rheumatoid arthritis) (HCC)    Social History   Socioeconomic History  . Marital status: Widowed    Spouse name: Not on file  . Number of children: Not on file  . Years of education: Not on file  . Highest education level: Not on file  Occupational History  . Not on file  Tobacco Use  . Smoking status: Former Smoker    Packs/day: 1.50    Years: 27.00    Pack years: 40.50    Types: Cigarettes    Quit date: 04/18/1981    Years since quitting: 39.0  . Smokeless tobacco: Former Neurosurgeon    Quit date: 11/27/1980  Vaping Use  . Vaping Use: Never used  Substance and Sexual Activity  . Alcohol use: Yes    Alcohol/week: 1.0 standard drink    Types: 1 Glasses of wine per week  .  Drug use: No  . Sexual activity: Not Currently    Partners: Male    Birth control/protection: Post-menopausal, Surgical    Comment: hyst  Other Topics Concern  . Not on file  Social History Narrative  . Not on file   Social Determinants of Health   Financial Resource Strain: Not on file  Food Insecurity: Not on file  Transportation Needs: Not on file  Physical Activity: Not on file  Stress: Not on file  Social Connections: Not on file   Scheduled Meds: . ARIPiprazole  5 mg Oral Daily  . Chlorhexidine Gluconate Cloth  6 each Topical Daily  . enoxaparin (LOVENOX) injection  30 mg Subcutaneous Q24H  . fluconazole  50 mg Oral Daily  . levothyroxine  75 mcg Oral Daily  . metoprolol succinate  25 mg Oral Daily  . pantoprazole  40 mg Oral QAC breakfast  . phenelzine  15 mg Oral BID    Continuous Infusions: . sodium chloride 65 mL/hr at 05/04/20 0619  . levofloxacin (LEVAQUIN) IV 500 mg (05/04/20 1100)   PRN Meds:.acetaminophen **OR** acetaminophen, iohexol, LORazepam, ondansetron **OR** ondansetron (ZOFRAN) IV, polyvinyl alcohol, simethicone Medications Prior to Admission:  Prior to Admission medications   Medication Sig Start Date End Date Taking? Authorizing Provider  acetaminophen (TYLENOL) 650 MG CR tablet Take 1,300 mg by mouth daily.   Yes [provider]  ARIPiprazole (ABILIFY) 5 MG tablet Take 5 mg by mouth daily.   Yes [provider]  fluconazole (DIFLUCAN) 50 MG tablet Take 1 tablet (50 mg total) by mouth daily. 04/06/20  Yes Lazaro Arms, MD  fosfomycin (MONUROL) 3 g PACK 1 tablet today, repeat 1 tablet in 3 days 04/10/20  Yes Lazaro Arms, MD  LORazepam (ATIVAN) 0.5 MG tablet Take 0.5 mg by mouth at bedtime.   Yes [provider]  losartan (COZAAR) 50 MG tablet Take 75 mg by mouth See admin instructions. TAKE 75 IN MORNING AND 25 EVERY EVENING 07/02/19  Yes [provider]  metoprolol succinate (TOPROL-XL) 25 MG 24 hr tablet Take 25 mg by mouth daily. 03/26/20  Yes [provider]  Multiple Vitamins-Minerals (CENTRUM SILVER 50+WOMEN) TABS Take 1 tablet by mouth.   Yes [provider]  ondansetron (ZOFRAN) 4 MG tablet Take 1 tablet (4 mg total) by mouth every 8 (eight) hours as needed for nausea or vomiting. 12/26/19  Yes Idol, [name], PA-C  pantoprazole (PROTONIX) 40 MG tablet Take 1 tablet (40 mg total) by mouth daily before breakfast. 04/01/20  Yes Rehman, Joline Maxcy, MD  phenelzine (NARDIL) 15 MG tablet Take 15 mg by mouth 2 (two) times daily. 02/26/20  Yes [provider]  polyvinyl alcohol (LIQUIFILM TEARS) 1.4 % ophthalmic solution Place one drop into both eyes 4 (four) times a day as needed for up to 30 days. 02/26/20  Yes [provider]  Probiotic, Lactobacillus, CAPS Take 1 capsule  by mouth daily.   Yes [provider]  SYNTHROID 75 MCG tablet Take 1 tablet (75 mcg total) by mouth daily. 09/20/17  Yes Hagler, Fleet Contras, MD  Cyanocobalamin (VITAMIN B-12 PO) Take by mouth daily. Patient not taking: No sig reported    [provider]  polyethylene glycol powder (GLYCOLAX/MIRALAX) powder 17 gram po qd as directed. Patient not taking: No sig reported 09/20/17   Aliene Beams, MD  Simethicone (PHAZYME ULTRA STRENGTH) 180 MG CAPS Take 1 capsule (180 mg total) by mouth 3 (three) times daily after meals. Patient not taking:  No sig reported 01/07/20   Malissa Hippo, MD  terconazole (TERAZOL 7) 0.4 % vaginal cream Place 1 applicator vaginally at bedtime. Patient not taking: No sig reported 04/06/20   Lazaro Arms, MD   Allergies  Allergen Reactions  . Penicillins Itching and Rash  . Bactrim [Sulfamethoxazole-Trimethoprim] Nausea Only    And diarrhea.  . Hydrocodone     MAO-I ; cannot take medications.   . Morphine And Related     Unable to take due to MAO-I   Review of Systems  Unable to perform ROS: Acuity of condition    Physical Exam Vitals and nursing note reviewed.  Constitutional:      Appearance: Normal appearance.  Skin:    General: Skin is warm and dry.  Neurological:     Comments: sleepy     Vital Signs: BP 115/60 (BP Location: Left Arm)   Pulse 84   Temp 98.4 F (36.9 C) (Oral)   Resp 17   Ht 5' (1.524 m)   Wt 54 kg   SpO2 96%   BMI 23.24 kg/m  Pain Scale: 0-10 POSS *See Group Information*: 1-Acceptable,Awake and alert Pain Score: 0-No pain   SpO2: SpO2: 96 % O2 Device:SpO2: 96 % O2 Flow Rate: .O2 Flow Rate (L/min): 3 L/min  IO: Intake/output summary:   Intake/Output Summary (Last 24 hours) at 05/04/2020 1625 Last data filed at 05/04/2020 1529 Gross per 24 hour  Intake 2261.92 ml  Output 300 ml  Net 1961.92 ml    LBM: Last BM Date: 05/03/20 Baseline Weight: Weight: 54 kg Most recent weight: Weight: 54 kg      Palliative Assessment/Data: PPS: unable to determine     Thank you for this consult. Palliative medicine will continue to follow and assist as needed.   Time In: 1542 Time Out: 1640 Time Total: 58 minutes Greater than 50%  of this time was spent counseling and coordinating care related to the above assessment and plan.  Signed by: Ocie Bob, AGNP-C Palliative Medicine    Please contact Palliative Medicine Team phone at [phone] for questions and concerns.  For individual provider: See Loretha Stapler

## 2020-05-04 NOTE — Progress Notes (Signed)
PROGRESS NOTE   Erica [name]  ZOX:[id number] DOB: 1932-06-23 DOA: 05/01/2020 PCP: Aliene Beams, MD   Chief Complaint  Patient presents with  . Abdominal Pain   Level of care: Telemetry  Brief Admission History:  87 y.o. female with medical history significant of anxiety, bipolar disorder, hyperlipidemia, hypothyroidism, IBS, rheumatoid arthritis, hypothyroidism, GERD/hiatal hernia, history of frequent UTI who is coming to the emergency department due to fever for the past 4 to 5 days associated with chills, night sweats, fatigue, frontal headache, malaise, decreased appetite and overall oral intake.  She saw GYN yesterday (Dr. Duane Lope) who prescribed 2 doses of fosfomycin for her after talking to infectious diseases (Dr. Earlene Plater).  However, the patient's symptoms have persisted despite taking the fosfomycin.   Assessment & Plan:   Principal Problem:   Sepsis secondary to UTI [facility]) Active Problems:   E coli bacteremia   GERD (gastroesophageal reflux disease)   Hypotension   Dehydration   Hypothyroidism   Hypertension   Hyponatremia   AKI (acute kidney injury) (HCC)   Hyperlipidemia   Hypokalemia   Macrocytic anemia   Protein-calorie malnutrition, mild (HCC)   Acute cystitis without hematuria   1. Sepsis secondary to E coli UTI - Pt responded well to IV meropenem and now de-escalating to levofloxacin based on culture data as there was no ESBL resistance found on culture.  2. E coli bacteremia - resistance to ceftriaxone, changed antibiotic to levofloxacin. Complete full 10 day course.  3. AKI - resolved with IV fluid.  4. Acute cystitis due to E coli - treating as above.  5. Leukocytosis - resolved with treatment.  6. Hypotension - stable.  Soft BPs noted.  Continue IV hydration.  7. GERD - protonix ordered for GI protection.  8. Dehydration - IV fluid given.  9. Hypokalemia - repleted.  Following.  10. Thrombocytopenia - stable, no bleeding.  Follow CBC.   11. Generalized weakness - Obtain PT eval.    DVT prophylaxis: enoxaparin Code Status: FULL  Family Communication:son at bedside, telephone call to son updated 2/21 Disposition: home she has 24/7 supervision and a caretaker at home per son  Status is: Inpatient  Remains inpatient appropriate because:IV treatments appropriate due to intensity of illness or inability to take PO and Inpatient level of care appropriate due to severity of illness  Dispo: The patient is from: Home              Anticipated d/c is to: Home              Anticipated d/c date is: 1 day              Patient currently is not medically stable to d/c.   Difficult to place patient No   Consultants:   Palliative   Procedures:   N/a   Antimicrobials:  Meropenem 2/19-2/20 Ceftriaxone 2/20>>2/21 Levofloxacin 2/21>>  Subjective: Pt reports that she was wheezing this morning.   Objective: Vitals:   05/03/20 1155 05/03/20 1601 05/03/20 2100 05/04/20 0615  BP: 92/60 109/68 114/73 119/69  Pulse: 85 83 94 (!) 102  Resp: Temp:  98.2 F (36.8 C) 98.2 F (36.8 C) 99.6 F (37.6 C)  TempSrc:  Oral Oral Oral  SpO2: 96% 100% 99% 98%  Weight:      Height:        Intake/Output Summary (Last 24 hours) at 05/04/2020 1051 Last data filed at 05/03/2020 2032 Gross per 24 hour  Intake  4065.85 ml  Output --  Net 4065.85 ml   Filed Weights   05/01/20 1830  Weight: 54 kg   Examination:  General exam: frail elderly female, awake,alert, oriented, Appears calm and comfortable  Respiratory system: Clear to auscultation. Respiratory effort normal. Cardiovascular system: normal S1 & S2 heard. No JVD, murmurs, rubs, gallops or clicks. No pedal edema. Gastrointestinal system: Abdomen is nondistended, soft and nontender. No organomegaly or masses felt. Normal bowel sounds heard. Central nervous system: Alert and oriented. No focal neurological deficits. Extremities: Symmetric 5 x 5 power. Skin: No rashes,  lesions or ulcers.  Psychiatry: Judgement and insight appear normal. Mood & affect appropriate.   Data Reviewed: I have personally reviewed following labs and imaging studies  CBC: Recent Labs  Lab 05/01/20 2258 05/02/20 0548 05/03/20 0612  WBC 20.2* 16.8* 8.7  NEUTROABS 16.2* 13.9* 6.7  HGB 11.2* 10.3* 9.6*  HCT 34.4* 31.9* 30.8*  MCV 100.9* 101.3* 100.7*  PLT 160 146* 146*    Basic Metabolic Panel: Recent Labs  Lab 05/01/20 2258 05/02/20 0548 05/03/20 0612  NA 130* 132* 136  K 3.2* 3.5 3.8  CL 99 101 108  CO2 21* 21* 22  GLUCOSE 128* 109* 105*  BUN 35* 32* 17  CREATININE 2.23* 1.66* 0.93  CALCIUM 8.3* 8.4* 8.5*  MG 1.7  --   --     GFR: Estimated Creatinine Clearance: 31.8 mL/min (by C-G formula based on SCr of 0.93 mg/dL).  Liver Function Tests: Recent Labs  Lab 05/01/20 2258  AST 21  ALT 16  ALKPHOS 77  BILITOT 0.8  PROT 6.5  ALBUMIN 3.0*    CBG: No results for input(s): GLUCAP in the last 168 hours.  Recent Results (from the past 240 hour(s))  Urine Culture     Status: None   Collection Time: 04/24/20  2:25 PM   Specimen: Urine   UR  Result Value Ref Range Status   Urine Culture, Routine Final report  Final   Organism ID, Bacteria Comment  Final    Comment: Greater than 2 organisms recovered, none predominant. Please submit another sample if clinically indicated. Greater than 100,000 colony forming units per mL   Urine Culture     Status: Abnormal   Collection Time: 04/30/20  1:37 PM   Specimen: Urine   UR  Result Value Ref Range Status   Urine Culture, Routine Final report (A)  Final   Organism ID, Bacteria Escherichia coli (A)  Final    Comment: Multi-Drug Resistant Organism Greater than 100,000 colony forming units per mL Susceptibility profile is consistent with a probable ESBL.    Antimicrobial Susceptibility Comment  Final    Comment:       ** S = Susceptible; I = Intermediate; R = Resistant **                    P = Positive;  N = Negative             MICS are expressed in micrograms per mL    Antibiotic                 RSLT#1    RSLT#2    RSLT#3    RSLT#4 Amoxicillin/Clavulanic Acid    R Ampicillin                     R Cefazolin  R Cefepime                       S Ceftriaxone                    R Cefuroxime                     R Ciprofloxacin                  S Gentamicin                     S Imipenem                       S Levofloxacin                   S Meropenem                      S Nitrofurantoin                 S Piperacillin/Tazobactam        S Tetracycline                   R Tobramycin                     S Trimethoprim/Sulfa             S   Culture, blood (Routine X 2) w Reflex to ID Panel     Status: Abnormal   Collection Time: 05/01/20 10:58 PM   Specimen: Left Antecubital; Blood  Result Value Ref Range Status   Specimen Description   Final    LEFT ANTECUBITAL Performed at [facility], [address], [place], [place] [address]    Special Requests   Final    BOTTLES DRAWN AEROBIC AND ANAEROBIC Blood Culture adequate volume Performed at [facility], [address], [place], [place] [address]    Culture  Setup Time   Final    GRAM NEGATIVE RODS AEROBIC BOTTLE ONLY Gram Stain Report Called to,Read Back By and Verified With: BRANDY MCMILLIAN AT 1705 BY HFLYNT 05/02/20 CRITICAL RESULT CALLED TO, READ BACK BY AND VERIFIED WITH: M,CULLEN RN @2043  05/02/20 EB Performed at [facility], [address] [place], [place]    Culture ESCHERICHIA COLI (A)  Final   Report Status 05/04/2020 FINAL  Final   Organism ID, Bacteria ESCHERICHIA COLI  Final      Susceptibility   Escherichia coli - MIC*    AMPICILLIN >=32 RESISTANT Resistant     CEFAZOLIN >=64 RESISTANT Resistant     CEFEPIME 0.5 SENSITIVE Sensitive     CEFTAZIDIME 16 INTERMEDIATE Intermediate     CEFTRIAXONE >=64 RESISTANT Resistant     CIPROFLOXACIN <=0.25 SENSITIVE Sensitive     GENTAMICIN  <=1 SENSITIVE Sensitive     IMIPENEM <=0.25 SENSITIVE Sensitive     TRIMETH/SULFA <=20 SENSITIVE Sensitive     AMPICILLIN/SULBACTAM >=32 RESISTANT Resistant     PIP/TAZO 8 SENSITIVE Sensitive     * ESCHERICHIA COLI  Blood Culture ID Panel (Reflexed)     Status: Abnormal   Collection Time: 05/01/20 10:58 PM  Result Value Ref Range Status   Enterococcus faecalis NOT DETECTED NOT DETECTED Final   Enterococcus Faecium NOT DETECTED NOT DETECTED Final   Listeria monocytogenes NOT DETECTED NOT DETECTED Final  Staphylococcus species NOT DETECTED NOT DETECTED Final   Staphylococcus aureus (BCID) NOT DETECTED NOT DETECTED Final   Staphylococcus epidermidis NOT DETECTED NOT DETECTED Final   Staphylococcus lugdunensis NOT DETECTED NOT DETECTED Final   Streptococcus species NOT DETECTED NOT DETECTED Final   Streptococcus agalactiae NOT DETECTED NOT DETECTED Final   Streptococcus pneumoniae NOT DETECTED NOT DETECTED Final   Streptococcus pyogenes NOT DETECTED NOT DETECTED Final   A.calcoaceticus-baumannii NOT DETECTED NOT DETECTED Final   Bacteroides fragilis NOT DETECTED NOT DETECTED Final   Enterobacterales DETECTED (A) NOT DETECTED Final    Comment: Enterobacterales represent a large order of gram negative bacteria, not a single organism. CRITICAL RESULT CALLED TO, READ BACK BY AND VERIFIED WITH: M,CULLEN RN  05/02/20 EB    Enterobacter cloacae complex NOT DETECTED NOT DETECTED Final   Escherichia coli DETECTED (A) NOT DETECTED Final    Comment: CRITICAL RESULT CALLED TO, READ BACK BY AND VERIFIED WITH: M,CULLEN RN  05/02/20 EB    Klebsiella aerogenes NOT DETECTED NOT DETECTED Final   Klebsiella oxytoca NOT DETECTED NOT DETECTED Final   Klebsiella pneumoniae NOT DETECTED NOT DETECTED Final   Proteus species NOT DETECTED NOT DETECTED Final   Salmonella species NOT DETECTED NOT DETECTED Final   Serratia marcescens NOT DETECTED NOT DETECTED Final   Haemophilus influenzae NOT DETECTED  NOT DETECTED Final   Neisseria meningitidis NOT DETECTED NOT DETECTED Final   Pseudomonas aeruginosa NOT DETECTED NOT DETECTED Final   Stenotrophomonas maltophilia NOT DETECTED NOT DETECTED Final   Candida albicans NOT DETECTED NOT DETECTED Final   Candida auris NOT DETECTED NOT DETECTED Final   Candida glabrata NOT DETECTED NOT DETECTED Final   Candida krusei NOT DETECTED NOT DETECTED Final   Candida parapsilosis NOT DETECTED NOT DETECTED Final   Candida tropicalis NOT DETECTED NOT DETECTED Final   Cryptococcus neoformans/gattii NOT DETECTED NOT DETECTED Final   CTX-M ESBL NOT DETECTED NOT DETECTED Final   Carbapenem resistance IMP NOT DETECTED NOT DETECTED Final   Carbapenem resistance KPC NOT DETECTED NOT DETECTED Final   Carbapenem resistance NDM NOT DETECTED NOT DETECTED Final   Carbapenem resist OXA 48 LIKE NOT DETECTED NOT DETECTED Final   Carbapenem resistance VIM NOT DETECTED NOT DETECTED Final    Comment: Performed at [facility], [address] [place], [place] [address]  Culture, blood (Routine X 2) w Reflex to ID Panel     Status: None (Preliminary result)   Collection Time: 05/01/20 11:23 PM   Specimen: BLOOD RIGHT ARM  Result Value Ref Range Status   Specimen Description   Final    BLOOD RIGHT ARM Performed at [facility], [address], [place], [place] [address]    Special Requests   Final    BOTTLES DRAWN AEROBIC ONLY Blood Culture adequate volume Performed at [facility], [address] [place], [place] [address]    Culture  Setup Time   Final    GRAM NEGATIVE RODS Gram Stain Report Called to,Read Back By and Verified With: BONDURANT,R @ 0410 ON 05/03/20 BY JUW AEROBIC BOTTLE ONLY GS DONE @ [facility]    Culture   Final    GRAM NEGATIVE RODS IDENTIFICATION TO FOLLOW Performed at [facility], [address], [place], [place] [address]    Report Status PENDING  Incomplete  SARS CORONAVIRUS 2 (TAT 6-24 HRS) Nasopharyngeal Nasopharyngeal Swab      Status: None   Collection Time: 05/02/20  3:58 AM   Specimen: Nasopharyngeal Swab  Result Value Ref Range Status   SARS Coronavirus 2 NEGATIVE NEGATIVE Final    Comment: (NOTE) SARS-CoV-2 target nucleic acids are NOT DETECTED.  The SARS-CoV-2 RNA is generally detectable in upper and lower respiratory specimens during the acute phase of infection. Negative results do not preclude SARS-CoV-2 infection, do not rule out co-infections with other pathogens, and should not be used as the sole basis for treatment or other patient management decisions. Negative results must be combined with clinical observations, patient history, and epidemiological information. The expected result is Negative.  Fact Sheet for Patients: HairSlick.no  Fact Sheet for Healthcare Providers: quierodirigir.com  This test is not yet approved or cleared by the [place] FDA and  has been authorized for detection and/or diagnosis of SARS-CoV-2 by FDA under an Emergency Use Authorization (EUA). This EUA will remain  in effect (meaning this test can be used) for the duration of the COVID-19 declaration under Se ction 564(b)(1) of the Act, 21 U.S.C. section 360bbb-3(b)(1), unless the authorization is terminated or revoked sooner.  Performed at [facility], [address], [place], [place] [address]   Culture, Urine     Status: None   Collection Time: 05/02/20  8:42 AM   Specimen: Urine, Clean Catch  Result Value Ref Range Status   Specimen Description   Final    URINE, CLEAN CATCH Performed at [facility], [address], [place], [place] [address]    Special Requests   Final    NONE Performed at [facility], [address], [place], [place] [address]    Culture   Final    NO GROWTH Performed at [facility], [address], [place], [place] [address]    Report Status 05/03/2020 FINAL  Final     Radiology Studies: No results  found.  Scheduled Meds: . ARIPiprazole  5 mg Oral Daily  . Chlorhexidine Gluconate Cloth  6 each Topical Daily  . enoxaparin (LOVENOX) injection  30 mg Subcutaneous Q24H  . fluconazole  50 mg Oral Daily  . levothyroxine  75 mcg Oral Daily  . metoprolol succinate  25 mg Oral Daily  . pantoprazole  40 mg Oral QAC breakfast  . phenelzine  15 mg Oral BID   Continuous Infusions: . sodium chloride 65 mL/hr at 05/04/20 0619  . levofloxacin (LEVAQUIN) IV       LOS: 2 days   Time spent: 35 mins  [employer] Laural Benes, MD How to contact the [facility] Attending or Consulting provider 7A - 7P or covering provider during after hours 7P -7A, for this patient?  1. Check the care team in [facility] and look for a) attending/consulting TR[facility] provider listed and b) the [facility] team listed 2. Log into [email or web address] and use [place]'s universal password to access. If you do not have the password, please contact the hospital operator. 3. Locate the [facility] provider you are looking for under [facility] and page to a number that you can be directly reached. 4. If you still have difficulty reaching the provider, please page the [facility] (Director on Call) for the Hospitalists listed on amion for assistance.  05/04/2020, 10:51 AM

## 2020-05-05 ENCOUNTER — Inpatient Hospital Stay (HOSPITAL_COMMUNITY): Payer: Medicare Other

## 2020-05-05 DIAGNOSIS — R54 Age-related physical debility: Secondary | ICD-10-CM

## 2020-05-05 DIAGNOSIS — M069 Rheumatoid arthritis, unspecified: Secondary | ICD-10-CM

## 2020-05-05 LAB — CULTURE, BLOOD (ROUTINE X 2): Special Requests: ADEQUATE

## 2020-05-05 MED ORDER — SALINE SPRAY 0.65 % NA SOLN
1.0000 | NASAL | Status: DC | PRN
  Administered 2020-05-06: 1 via NASAL
  Filled 2020-05-05: qty 44

## 2020-05-05 NOTE — Progress Notes (Addendum)
Daily Progress Note   Patient Name: Erica [name]       Date: 05/05/2020 DOB: 11/16/1932  Age: 87 y.o. MRN#: [id number] Attending Physician: Murlean Iba, MD Primary Care Physician: [name], MD Admit Date: 05/01/2020  Reason for Consultation/Follow-up: Establishing goals of care  Subjective: Met with the patient, her son and daughter-in-law at the bedside.  Patient is awake and alert and oriented. Patient graduated college, and worked as a Radio producer.  She was married, her spouse died 2 years ago.   She currently lives at home in [place] she has a caretaker who stays with her from 7 AM until 8 PM 7 days a week, however, family is also seeking an overnight caretaker and looking to reduce the number of hours required by their current caretaker. Erica [name] considers herself to be very creative, she finds joy in decoupage and painting and planting flowers-these things are difficult sometimes due to her debility from her rheumatoid arthritis and when she is not feeling motivated due to feeling ill or due to her depression. The last 2 years with isolation from Covid and have been difficult.  Although she does have outlets for social interactions, she has many friends who will come to visit her and she does go out occasionally and play cards with her friends.  However, when she is not feeling well being social it is not important to her.  Feeling well includes not just her physical status with her recurrent UTIs and rheumatoid arthritis-it also includes her mental status with her struggles with depression. She is under the care of a psychiatrist and her Nardil has been very helpful for her.  She was started on Zyprexa over the Christmas holiday and was not able to tolerate the side  effects. She does have future plans to transition to a retirement community.  She is in agreement and has appropriate resources to go to [facility].   Her son would like her to go to the new facility being built in [place] as this is closer to their home. We discussed how living in this type of community may help with her social isolation and her want for "things to do " when she is alone and able.  She requires assistance with bathing, she is able to dress herself and feed herself.  Her caretaker prepares  her meals for her and assists with housework. We discussed her current state of health and future trajectories.   We discussed the fact that sepsis can recur from ongoing UTIs and is this is something that people may eventually die from. Goals of care include for Erica [name] to be able to continue to do her own personal needs. She would not want to continue to live in a state where she was bedridden and required someone else to do her personal needs.  This is a point where she would where she would likely choose a different path of treatment for herself and allow nature to take its course.  She has an HCPOA document and a living will.  Her son Erica [name] is her designated [facility] POA.  She also endorses DO NOT RESUSCITATE CODE STATUS.  Review of Systems  Constitutional: Positive for malaise/fatigue. Negative for weight loss.  Respiratory: Negative.   Cardiovascular: Negative.   Musculoskeletal: Positive for joint pain.  Psychiatric/Behavioral: Positive for depression. Negative for suicidal ideas.    Length of Stay: 3  Current Medications: Scheduled Meds:  . ARIPiprazole  5 mg Oral Daily  . Chlorhexidine Gluconate Cloth  6 each Topical Daily  . enoxaparin (LOVENOX) injection  30 mg Subcutaneous Q24H  . fluconazole  50 mg Oral Daily  . levothyroxine  75 mcg Oral Daily  . metoprolol succinate  25 mg Oral Daily  . pantoprazole  40 mg Oral QAC breakfast  . phenelzine  15 mg Oral BID     Continuous Infusions: . sodium chloride 65 mL/hr at 05/04/20 2249  . levofloxacin (LEVAQUIN) IV 500 mg (05/04/20 1100)    PRN Meds: acetaminophen **OR** acetaminophen, iohexol, LORazepam, ondansetron **OR** ondansetron (ZOFRAN) IV, polyvinyl alcohol, simethicone  Physical Exam Vitals and nursing note reviewed.  Constitutional:      General: She is not in acute distress.    Appearance: Normal appearance. She is not toxic-appearing.  Cardiovascular:     Rate and Rhythm: Normal rate.  Pulmonary:     Effort: Pulmonary effort is normal.  Musculoskeletal:        General: Swelling and deformity present.     Comments: Bilateral swollen hand joints- contracted Swelling in knee per patient report  Skin:    General: Skin is warm and dry.  Neurological:     Mental Status: She is alert and oriented to person, place, and time. Mental status is at baseline.  Psychiatric:        Mood and Affect: Mood normal.        Behavior: Behavior normal.        Thought Content: Thought content normal.        Judgment: Judgment normal.             Vital Signs: BP (!) 131/58   Pulse (!) 102   Temp 98 F (36.7 C) (Oral)   Resp 18   Ht 5' (1.524 m)   Wt 54 kg   SpO2 97%   BMI 23.24 kg/m  SpO2: SpO2: 97 % O2 Device: O2 Device: Nasal Cannula O2 Flow Rate: O2 Flow Rate (L/min): 3 L/min  Intake/output summary:   Intake/Output Summary (Last 24 hours) at 05/05/2020 1113 Last data filed at 05/05/2020 1025 Gross per 24 hour  Intake 2281.92 ml  Output 2950 ml  Net -668.08 ml   LBM: Last BM Date: 05/02/20 Baseline Weight: Weight: 54 kg Most recent weight: Weight: 54 kg       Palliative Assessment/Data: PPS: 50%  Patient Active Problem List   Diagnosis Date Noted  . E coli bacteremia 05/03/2020  . Sepsis secondary to UTI ([place]) 05/02/2020  . Hyperlipidemia   . Hypokalemia   . Macrocytic anemia   . Protein-calorie malnutrition, mild ([place])   . Acute cystitis without hematuria   .  Nausea without vomiting 01/07/2020  . Bloating 01/28/2019  . Boil of groin 12/31/2018  . Constipation in female 06/22/2017  . Fall 05/07/2017  . Right rib fracture 05/07/2017  . AKI (acute kidney injury) ([place]) 05/04/2017  . Hyponatremia 05/03/2017  . Bipolar disorder with severe depression ([place]) 11/23/2016  . Hypertension 11/23/2016  . Rheumatoid arthritis involving both hands with positive rheumatoid factor ([place]) 06/01/2016  . Partial small bowel obstruction ([place]) 03/12/2015  . Depression 03/12/2015  . Hypotension 03/12/2015  . Lactic acidosis 03/12/2015  . Dehydration 03/12/2015  . Small bowel obstruction ([place]) 03/12/2015  . Hypothyroidism 02/21/2014  . 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 Major depressive disorder, recurrent episode with anxious distress ([place]) 02/13/2014  . Tachycardia 08/24/2013  . S/P arthroscopy of shoulder 08/14/2013  . Muscle weakness (generalized) 06/14/2013  . Rotator cuff syndrome of right shoulder 12/05/2012  . Right shoulder pain 12/05/2012  . Rectal bleeding 06/06/2011  . Epigastric pain 04/19/2011  . GERD (gastroesophageal reflux disease) 04/19/2011  . IBS (irritable bowel syndrome) 04/19/2011  . METATARSALGIA 05/12/2009  . BUNION 05/12/2009    Palliative Care Assessment & Plan   Patient Profile:  87 y.o. female  with past medical history of major depression, HLD, hypothyroid, IBS, rheumatoid arthritis, GERD, frequent recurrent multi-drug resistant UTI's admitted on 05/01/2020 with sepsis E. Coli UTI/bacteremia refractory to outpatient treatment, blood cultures positive for E. Coli, acute kidney injury r/t dehydration, deconditioning. Palliative medicine consulted for goals of car and advanced care planning.     Assessment/Recommendations/Plan   DNR  Recommend home PT/OT for deconditioning, arthritis  Recommend sooner than later placement in assisted living for physical, social, and psychological needs  TOC referral for outpatient  Palliative  Knee pain and swelling- have sent message to primary team with patient's request for evaluation  Family seeking overnight caretaker prior to patient's discharge- have contacted patient's primary team with request to delay discharge until care is arranged, TOC is assisting family as well  Goals of Care and Additional Recommendations:  Limitations on Scope of Treatment: Full Scope Treatment  Code Status:  DNR  Prognosis:   Unable to determine  Discharge Planning:  Home with [place] was discussed with patient and care team  Thank you for allowing the Palliative Medicine Team to assist in the care of this patient.   Total time: 72 minutes- approx 30 min time devoted specifically to advanced healthcare planning  Greater than 50%  of this time was spent counseling and coordinating care related to the above assessment and plan.  Mariana Kaufman, AGNP-C Palliative Medicine   Please contact Palliative Medicine Team phone at [phone] for questions and concerns.

## 2020-05-05 NOTE — Evaluation (Signed)
Physical Therapy Evaluation Patient Details Name: Erica [name] MRN: [id number] DOB: 07-10-1932 Today's Date: 05/05/2020   History of Present Illness  Erica [name] is a 87 y.o. female with medical history significant of anxiety, bipolar disorder, hypothyroidism, IBS, RA, GERD/hiatal hernia, history of frequent UTI with current c/o fever for the past 4 to 5 days associated with chills, night sweats, fatigue, frontal headache, malaise, decreased appetite and overall oral intake.  Clinical Impression  Pt admitted with above diagnosis. PTA, pt with aide present 9am-6pm 7 days per week, using SPC in house and community with occasional WC use in community. Pt admits 1 fall at end of January, broke R ankle and is now WBAT with walking boot. Pt currently requiring min G with transfers and ambulation, limited by fatigue with mobility. Pt on RA with SpO2 95%- RN notified. Pt currently with functional limitations due to the deficits listed below (see PT Problem List). Pt will benefit from skilled PT to increase their independence and safety with mobility to allow discharge to the venue listed below.       Follow Up Recommendations Home health PT;Supervision/Assistance - 24 hour    Equipment Recommendations  None recommended by PT    Recommendations for Other Services       Precautions / Restrictions Precautions Precautions: Fall Required Braces or Orthoses: Other Brace Other Brace: R boot Restrictions Weight Bearing Restrictions: Yes RLE Weight Bearing: Weight bearing as tolerated Other Position/Activity Restrictions: with boot      Mobility  Bed Mobility Overal bed mobility: Needs Assistance Bed Mobility: Supine to Sit  Supine to sit: Supervision  General bed mobility comments: raises to sitting EOB with slightly increasead time, light use of bedrail    Transfers Overall transfer level: Needs assistance Equipment used: 1 person hand held assist Transfers: Sit to/from Stand Sit  to Stand: Min guard  General transfer comment: min G to steady with powering up to standing  Ambulation/Gait Ambulation/Gait assistance: Min guard Gait Distance (Feet): 4 Feet Assistive device: 1 person hand held assist Gait Pattern/deviations: Step-to pattern;Decreased stride length     General Gait Details: short, step to pattern with HHA to steady, able to take steps over to chair and limited due to fatigue  Stairs            Wheelchair Mobility    Modified Rankin (Stroke Patients Only)       Balance Overall balance assessment: Needs assistance;History of Falls Sitting-balance support: Feet supported;No upper extremity supported Sitting balance-Leahy Scale: Good Sitting balance - Comments: seated EOB   Standing balance support: During functional activity;Single extremity supported Standing balance-Leahy Scale: Poor Standing balance comment: reliant on UE support        Pertinent Vitals/Pain Pain Assessment: No/denies pain    Home Living Family/patient expects to be discharged to:: Private residence Living Arrangements: Alone Available Help at Discharge: Personal care attendant;Available PRN/intermittently (Pt reports aide 9am-6pm 7 days/week) Type of Home: House Home Access: Stairs to enter Entrance Stairs-Rails: Right Entrance Stairs-Number of Steps: 2 Home Layout: Two level;Able to live on main level with bedroom/bathroom Home Equipment: Walker - 2 wheels;Grab bars - tub/shower;Shower seat;Cane - single point;Wheelchair - [employer] - 4 wheels      Prior Function Level of Independence: Needs assistance   Gait / Transfers Assistance Needed: Pt reports mod I with SPC in house and community, occasional WC use with family pushing pt if longer distance  ADL's / Homemaking Assistance Needed: Pt reports aide provides cooking and cleaning,  SUPV for bathing and dressing  Comments: Pt reports aide provides transportation and children provide transportation      Hand Dominance   Dominant Hand: Right    Extremity/Trunk Assessment   Upper Extremity Assessment Upper Extremity Assessment: Overall WFL for tasks assessed    Lower Extremity Assessment Lower Extremity Assessment: Overall WFL for tasks assessed;RLE deficits/detail RLE Deficits / Details: R boot due to recent ankle fracture, hip and knee AROM WNL and strength 3+/5    Cervical / Trunk Assessment Cervical / Trunk Assessment: Kyphotic  Communication   Communication: No difficulties  Cognition Arousal/Alertness: Awake/alert Behavior During Therapy: WFL for tasks assessed/performed Overall Cognitive Status: Within Functional Limits for tasks assessed     General Comments General comments (skin integrity, edema, etc.): Pt on RA with SpO2 95%- RN notified    Exercises     Assessment/Plan    PT Assessment Patient needs continued PT services  PT Problem List Decreased strength;Decreased range of motion;Decreased activity tolerance;Decreased balance;Decreased knowledge of use of DME;Decreased safety awareness       PT Treatment Interventions DME instruction;Gait training;Functional mobility training;Therapeutic activities;Therapeutic exercise;Balance training;Neuromuscular re-education;Stair training;Patient/family education    PT Goals (Current goals can be found in the Care Plan section)  Acute Rehab PT Goals Patient Stated Goal: "go home tomorrow" PT Goal Formulation: With patient Time For Goal Achievement: 05/19/20 Potential to Achieve Goals: Good    Frequency Min 3X/week   Barriers to discharge        Co-evaluation               AM-PAC PT "6 Clicks" Mobility  Outcome Measure Help needed turning from your back to your side while in a flat bed without using bedrails?: None Help needed moving from lying on your back to sitting on the side of a flat bed without using bedrails?: None Help needed moving to and from a bed to a chair (including a wheelchair)?:  A Little Help needed standing up from a chair using your arms (e.g., wheelchair or bedside chair)?: A Little Help needed to walk in hospital room?: A Little Help needed climbing 3-5 steps with a railing? : A Little 6 Click Score: 20    End of Session Equipment Utilized During Treatment: Gait belt Activity Tolerance: Patient tolerated treatment well Patient left: in chair;with call bell/phone within reach Nurse Communication: Mobility status PT Visit Diagnosis: Unsteadiness on feet (R26.81);Other abnormalities of gait and mobility (R26.89)    Time: 3086-5784 PT Time Calculation (min) (ACUTE ONLY): 35 min   Charges:   PT Evaluation $PT Eval Low Complexity: 1 Low PT Treatments $Therapeutic Activity: 8-22 mins         Tori Broussard PT, DPT 05/05/20, 12:05 PM

## 2020-05-05 NOTE — Plan of Care (Signed)
  Problem: Acute Rehab PT Goals(only PT should resolve) Goal: Patient Will Transfer Sit To/From Stand Outcome: Progressing Flowsheets (Taken 05/05/2020 1210) Patient will transfer sit to/from stand: with supervision Goal: Pt Will Transfer Bed To Chair/Chair To Bed Outcome: Progressing Flowsheets (Taken 05/05/2020 1210) Pt will Transfer Bed to Chair/Chair to Bed: with supervision Goal: Pt Will Ambulate Outcome: Progressing Flowsheets (Taken 05/05/2020 1210) Pt will Ambulate:  100 feet  with min guard assist  with least restrictive assistive device   Tori Kayde Atkerson PT, DPT 05/05/20, 12:10 PM

## 2020-05-05 NOTE — Progress Notes (Signed)
PROGRESS NOTE   Erica [name]  ZOX:[id number] DOB: 1932/12/29 DOA: 05/01/2020 PCP: Aliene Beams, MD   Chief Complaint  Patient presents with   Abdominal Pain   Level of care: Telemetry  Brief Admission History:  87 y.o. female with medical history significant of anxiety, bipolar disorder, hyperlipidemia, hypothyroidism, IBS, rheumatoid arthritis, hypothyroidism, GERD/hiatal hernia, history of frequent UTI who is coming to the emergency department due to fever for the past 4 to 5 days associated with chills, night sweats, fatigue, frontal headache, malaise, decreased appetite and overall oral intake.  She saw GYN yesterday (Dr. Duane Lope) who prescribed 2 doses of fosfomycin for her after talking to infectious diseases (Dr. Earlene Plater).  However, the patient's symptoms have persisted despite taking the fosfomycin.   Assessment & Plan:   Principal Problem:   Sepsis secondary to UTI [facility]) Active Problems:   E coli bacteremia   GERD (gastroesophageal reflux disease)   Hypotension   Dehydration   Hypothyroidism   Hypertension   Hyponatremia   AKI (acute kidney injury) (HCC)   Hyperlipidemia   Hypokalemia   Macrocytic anemia   Protein-calorie malnutrition, mild (HCC)   Acute cystitis without hematuria   1. Sepsis secondary to E coli UTI - Pt responded well to IV meropenem and now de-escalated to levofloxacin based on culture data as there was no ESBL resistance found on culture.  2. E coli bacteremia - resistance to ceftriaxone, changed antibiotic to levofloxacin. Complete full 10 day course.  3. AKI - resolved with IV fluid.  4. Acute cystitis due to E coli - treating as above. 5. Right knee pain - from fall at home.  Check xrays.  PT/OT eval requested.   6. Leukocytosis - resolved with treatment.  7. Hypotension - stable.  Soft BPs noted.  Improved with hydration.  Continue IV hydration.  8. GERD - protonix ordered for GI protection.  9. Dehydration - IV fluid given.   10. Hypokalemia - repleted.  Following.  11. Thrombocytopenia - stable, no bleeding.  Follow CBC.  12. Generalized weakness - Obtain PT eval.    DVT prophylaxis: enoxaparin Code Status: FULL  Family Communication:son at bedside, telephone call to son updated 2/21 Disposition: does not have 24/7 supervision which would be needed for a safe discharge. Family arranging night supervision.  She has daytime caretaker in place.  Hopefully can discharge home 2/23 when arrangements completed.   Status is: Inpatient  Remains inpatient appropriate because:IV treatments appropriate due to intensity of illness or inability to take PO and Inpatient level of care appropriate due to severity of illness  Unsafe discharge at this time: Family working to arrange 24/7 supervision. Hopefully will be arranged by tomorrow per Child psychotherapist.    Dispo: The patient is from: Home              Anticipated d/c is to: Home              Anticipated d/c date is: 1 day              Patient currently is not medically stable to d/c.   Difficult to place patient No   Consultants:   Palliative care  Procedures:   N/a   Antimicrobials:  Meropenem 2/19-2/20 Ceftriaxone 2/20>>2/21 Levofloxacin 2/21>>  Subjective: Pt c/o right knee pain from fall at home.    Objective: Vitals:   05/04/20 1521 05/04/20 2009 05/05/20 0605 05/05/20 1104  BP: 115/60 124/64 114/74 (!) 131/58  Pulse: 84 93 89 (!)  102  Resp: Temp: 98.4 F (36.9 C) 98.3 F (36.8 C) 98 F (36.7 C)   TempSrc: Oral Oral Oral   SpO2: 96% 95% 97%   Weight:      Height:        Intake/Output Summary (Last 24 hours) at 05/05/2020 1210 Last data filed at 05/05/2020 1025 Gross per 24 hour  Intake 2281.92 ml  Output 2950 ml  Net -668.08 ml   Filed Weights   05/01/20 1830  Weight: 54 kg   Examination:  General exam: frail elderly female, awake,alert, oriented, Appears calm and comfortable  Respiratory system: bibasilar wheeze heard.  Respiratory effort normal. Cardiovascular system: normal S1 & S2 heard. No JVD, murmurs, rubs, gallops or clicks. No pedal edema. Gastrointestinal system: Abdomen is nondistended, soft and nontender. No organomegaly or masses felt. Normal bowel sounds heard. Central nervous system: Alert and oriented. No focal neurological deficits. Extremities: diffuse arthritic changes and deformities, right knee mild edema and painful to touch, Symmetric 5 x 5 power. Skin: No rashes, lesions or ulcers.  Psychiatry: Judgement and insight appear normal. Mood & affect appropriate.   Data Reviewed: I have personally reviewed following labs and imaging studies  CBC: Recent Labs  Lab 05/01/20 2258 05/02/20 0548 05/03/20 0612  WBC 20.2* 16.8* 8.7  NEUTROABS 16.2* 13.9* 6.7  HGB 11.2* 10.3* 9.6*  HCT 34.4* 31.9* 30.8*  MCV 100.9* 101.3* 100.7*  PLT 160 146* 146*    Basic Metabolic Panel: Recent Labs  Lab 05/01/20 2258 05/02/20 0548 05/03/20 0612  NA 130* 132* 136  K 3.2* 3.5 3.8  CL 99 101 108  CO2 21* 21* 22  GLUCOSE 128* 109* 105*  BUN 35* 32* 17  CREATININE 2.23* 1.66* 0.93  CALCIUM 8.3* 8.4* 8.5*  MG 1.7  --   --     GFR: Estimated Creatinine Clearance: 31.8 mL/min (by C-G formula based on SCr of 0.93 mg/dL).  Liver Function Tests: Recent Labs  Lab 05/01/20 2258  AST 21  ALT 16  ALKPHOS 77  BILITOT 0.8  PROT 6.5  ALBUMIN 3.0*    CBG: Recent Labs  Lab 05/04/20 2114  GLUCAP 110*    Recent Results (from the past 240 hour(s))  Urine Culture     Status: Abnormal   Collection Time: 04/30/20  1:37 PM   Specimen: Urine   UR  Result Value Ref Range Status   Urine Culture, Routine Final report (A)  Final   Organism ID, Bacteria Escherichia coli (A)  Final    Comment: Multi-Drug Resistant Organism Greater than 100,000 colony forming units per mL Susceptibility profile is consistent with a probable ESBL.    Antimicrobial Susceptibility Comment  Final    Comment:       **  S = Susceptible; I = Intermediate; R = Resistant **                    P = Positive; N = Negative             MICS are expressed in micrograms per mL    Antibiotic                 RSLT#1    RSLT#2    RSLT#3    RSLT#4 Amoxicillin/Clavulanic Acid    R Ampicillin                     R Cefazolin  R Cefepime                       S Ceftriaxone                    R Cefuroxime                     R Ciprofloxacin                  S Gentamicin                     S Imipenem                       S Levofloxacin                   S Meropenem                      S Nitrofurantoin                 S Piperacillin/Tazobactam        S Tetracycline                   R Tobramycin                     S Trimethoprim/Sulfa             S   Culture, blood (Routine X 2) w Reflex to ID Panel     Status: Abnormal   Collection Time: 05/01/20 10:58 PM   Specimen: Left Antecubital; Blood  Result Value Ref Range Status   Specimen Description   Final    LEFT ANTECUBITAL Performed at [facility], [address] [place], [place] [address]    Special Requests   Final    BOTTLES DRAWN AEROBIC AND ANAEROBIC Blood Culture adequate volume Performed at [facility], [address] [place], [place] [address]    Culture  Setup Time   Final    GRAM NEGATIVE RODS AEROBIC BOTTLE ONLY Gram Stain Report Called to,Read Back By and Verified With: BRANDY MCMILLIAN AT 1705 BY HFLYNT 05/02/20 CRITICAL RESULT CALLED TO, READ BACK BY AND VERIFIED WITH: M,CULLEN RN @2043  05/02/20 EB Performed at [facility], [address], [place], [place]    Culture ESCHERICHIA COLI (A)  Final   Report Status 05/04/2020 FINAL  Final   Organism ID, Bacteria ESCHERICHIA COLI  Final      Susceptibility   Escherichia coli - MIC*    AMPICILLIN >=32 RESISTANT Resistant     CEFAZOLIN >=64 RESISTANT Resistant     CEFEPIME 0.5 SENSITIVE Sensitive     CEFTAZIDIME 16 INTERMEDIATE Intermediate     CEFTRIAXONE  >=64 RESISTANT Resistant     CIPROFLOXACIN <=0.25 SENSITIVE Sensitive     GENTAMICIN <=1 SENSITIVE Sensitive     IMIPENEM <=0.25 SENSITIVE Sensitive     TRIMETH/SULFA <=20 SENSITIVE Sensitive     AMPICILLIN/SULBACTAM >=32 RESISTANT Resistant     PIP/TAZO 8 SENSITIVE Sensitive     * ESCHERICHIA COLI  Blood Culture ID Panel (Reflexed)     Status: Abnormal   Collection Time: 05/01/20 10:58 PM  Result Value Ref Range Status   Enterococcus faecalis NOT DETECTED NOT DETECTED Final   Enterococcus Faecium NOT DETECTED NOT DETECTED Final   Listeria monocytogenes NOT DETECTED NOT DETECTED Final  Staphylococcus species NOT DETECTED NOT DETECTED Final   Staphylococcus aureus (BCID) NOT DETECTED NOT DETECTED Final   Staphylococcus epidermidis NOT DETECTED NOT DETECTED Final   Staphylococcus lugdunensis NOT DETECTED NOT DETECTED Final   Streptococcus species NOT DETECTED NOT DETECTED Final   Streptococcus agalactiae NOT DETECTED NOT DETECTED Final   Streptococcus pneumoniae NOT DETECTED NOT DETECTED Final   Streptococcus pyogenes NOT DETECTED NOT DETECTED Final   A.calcoaceticus-baumannii NOT DETECTED NOT DETECTED Final   Bacteroides fragilis NOT DETECTED NOT DETECTED Final   Enterobacterales DETECTED (A) NOT DETECTED Final    Comment: Enterobacterales represent a large order of gram negative bacteria, not a single organism. CRITICAL RESULT CALLED TO, READ BACK BY AND VERIFIED WITH: M,CULLEN RN @2043  05/02/20 EB    Enterobacter cloacae complex NOT DETECTED NOT DETECTED Final   Escherichia coli DETECTED (A) NOT DETECTED Final    Comment: CRITICAL RESULT CALLED TO, READ BACK BY AND VERIFIED WITH: M,CULLEN RN @2043  05/02/20 EB    Klebsiella aerogenes NOT DETECTED NOT DETECTED Final   Klebsiella oxytoca NOT DETECTED NOT DETECTED Final   Klebsiella pneumoniae NOT DETECTED NOT DETECTED Final   Proteus species NOT DETECTED NOT DETECTED Final   Salmonella species NOT DETECTED NOT DETECTED Final    Serratia marcescens NOT DETECTED NOT DETECTED Final   Haemophilus influenzae NOT DETECTED NOT DETECTED Final   Neisseria meningitidis NOT DETECTED NOT DETECTED Final   Pseudomonas aeruginosa NOT DETECTED NOT DETECTED Final   Stenotrophomonas maltophilia NOT DETECTED NOT DETECTED Final   Candida albicans NOT DETECTED NOT DETECTED Final   Candida auris NOT DETECTED NOT DETECTED Final   Candida glabrata NOT DETECTED NOT DETECTED Final   Candida krusei NOT DETECTED NOT DETECTED Final   Candida parapsilosis NOT DETECTED NOT DETECTED Final   Candida tropicalis NOT DETECTED NOT DETECTED Final   Cryptococcus neoformans/gattii NOT DETECTED NOT DETECTED Final   CTX-M ESBL NOT DETECTED NOT DETECTED Final   Carbapenem resistance IMP NOT DETECTED NOT DETECTED Final   Carbapenem resistance KPC NOT DETECTED NOT DETECTED Final   Carbapenem resistance NDM NOT DETECTED NOT DETECTED Final   Carbapenem resist OXA 48 LIKE NOT DETECTED NOT DETECTED Final   Carbapenem resistance VIM NOT DETECTED NOT DETECTED Final    Comment: Performed at [facility], [address] [place], [address] [place]  Culture, blood (Routine X 2) w Reflex to ID Panel     Status: Abnormal   Collection Time: 05/01/20 11:23 PM   Specimen: BLOOD RIGHT ARM  Result Value Ref Range Status   Specimen Description   Final    BLOOD RIGHT ARM Performed at [facility], [address], [place], [address] [place]    Special Requests   Final    BOTTLES DRAWN AEROBIC ONLY Blood Culture adequate volume Performed at [facility], [address], [place], [address] [place]    Culture  Setup Time   Final    GRAM NEGATIVE RODS Gram Stain Report Called to,Read Back By and Verified With: BONDURANT,R @ 0410 ON 05/03/20 BY JUW AEROBIC BOTTLE ONLY GS DONE @ [facility]    Culture (A)  Final    ESCHERICHIA COLI SUSCEPTIBILITIES PERFORMED ON PREVIOUS CULTURE WITHIN THE LAST 5 DAYS. Performed at [facility], [address], [place], [address]  [place]    Report Status 05/05/2020 FINAL  Final  SARS CORONAVIRUS 2 (TAT 6-24 HRS) Nasopharyngeal Nasopharyngeal Swab     Status: None   Collection Time: 05/02/20  3:58 AM  Specimen: Nasopharyngeal Swab  Result Value Ref Range Status   SARS Coronavirus 2 NEGATIVE NEGATIVE Final    Comment: (NOTE) SARS-CoV-2 target nucleic acids are NOT DETECTED.  The SARS-CoV-2 RNA is generally detectable in upper and lower respiratory specimens during the acute phase of infection. Negative results do not preclude SARS-CoV-2 infection, do not rule out co-infections with other pathogens, and should not be used as the sole basis for treatment or other patient management decisions. Negative results must be combined with clinical observations, patient history, and epidemiological information. The expected result is Negative.  Fact Sheet for Patients: HairSlick.no  Fact Sheet for Healthcare Providers: quierodirigir.com  This test is not yet approved or cleared by the [place] FDA and  has been authorized for detection and/or diagnosis of SARS-CoV-2 by FDA under an Emergency Use Authorization (EUA). This EUA will remain  in effect (meaning this test can be used) for the duration of the COVID-19 declaration under Se ction 564(b)(1) of the Act, 21 U.S.C. section 360bbb-3(b)(1), unless the authorization is terminated or revoked sooner.  Performed at [facility], [address] [place], [place] [address]   Culture, Urine     Status: None   Collection Time: 05/02/20  8:42 AM   Specimen: Urine, Clean Catch  Result Value Ref Range Status   Specimen Description   Final    URINE, CLEAN CATCH Performed at [facility], [address] [place], [place] [address]    Special Requests   Final    NONE Performed at [facility], [address] [place], [place] [address]    Culture   Final    NO GROWTH Performed at [facility],  [address], [place], [place] [address]    Report Status 05/03/2020 FINAL  Final     Radiology Studies: No results found.  Scheduled Meds:  ARIPiprazole  5 mg Oral Daily   Chlorhexidine Gluconate Cloth  6 each Topical Daily   enoxaparin (LOVENOX) injection  30 mg Subcutaneous Q24H   fluconazole  50 mg Oral Daily   levothyroxine  75 mcg Oral Daily   metoprolol succinate  25 mg Oral Daily   pantoprazole  40 mg Oral QAC breakfast   phenelzine  15 mg Oral BID   Continuous Infusions:  sodium chloride 65 mL/hr at 05/04/20 2249   levofloxacin (LEVAQUIN) IV 500 mg (05/04/20 1100)     LOS: 3 days   Time spent: 35 mins  Milayna Rotenberg Laural Benes, MD How to contact the [facility] Attending or Consulting provider 7A - 7P or covering provider during after hours 7P -7A, for this patient?  1. Check the care team in [facility] and look for a) attending/consulting TRH provider listed and b) the [facility] team listed 2. Log into [email or web address] and use [facility] universal password to access. If you do not have the password, please contact the hospital operator. 3. Locate the [facility] provider you are looking for under [facility] and page to a number that you can be directly reached. 4. If you still have difficulty reaching the provider, please page the [facility] (Director on Call) for the Hospitalists listed on amion for assistance.  05/05/2020, 12:10 PM

## 2020-05-05 NOTE — TOC Progression Note (Signed)
Transition of Care [facility]) - Progression Note    Patient Details  Name: Erica [name] MRN: [id number] Date of Birth: 1933-01-12  Transition of Care [facility]) CM/SW Contact  Karn [place], [place] Phone Number: 05/05/2020, 11:23 AM  Clinical Narrative:  [facility] received consult for outpatient palliative care. LCSW discussed with son who requests referral to [facility] and Palliative Care. Referral made on hub.       Barriers to Discharge: Continued Medical Work up  Expected Discharge Plan and Services                                                 Social Determinants of Health (SDOH) Interventions    Readmission Risk Interventions No flowsheet data found.

## 2020-05-06 ENCOUNTER — Telehealth: Payer: Self-pay | Admitting: Obstetrics & Gynecology

## 2020-05-06 LAB — CBC
HCT: 28.4 % — ABNORMAL LOW (ref 36.0–46.0)
Hemoglobin: 9.1 g/dL — ABNORMAL LOW (ref 12.0–15.0)
MCH: 32.2 pg (ref 26.0–34.0)
MCHC: 32 g/dL (ref 30.0–36.0)
MCV: 100.4 fL — ABNORMAL HIGH (ref 80.0–100.0)
Platelets: 173 10*3/uL (ref 150–400)
RBC: 2.83 MIL/uL — ABNORMAL LOW (ref 3.87–5.11)
RDW: 14 % (ref 11.5–15.5)
WBC: 8.7 10*3/uL (ref 4.0–10.5)
nRBC: 0 % (ref 0.0–0.2)

## 2020-05-06 MED ORDER — ADULT MULTIVITAMIN W/MINERALS CH: 1.0000 | ORAL_TABLET | Freq: Every day | ORAL | Status: DC

## 2020-05-06 MED ORDER — LEVOFLOXACIN 500 MG PO TABS: 500.0000 mg | ORAL_TABLET | Freq: Every day | ORAL | 0 refills | Status: AC

## 2020-05-06 MED ORDER — ENSURE ENLIVE PO LIQD: 237.0000 mL | Freq: Two times a day (BID) | ORAL | Status: DC

## 2020-05-06 NOTE — Discharge Instructions (Signed)
1)Avoid ibuprofen/Advil/Aleve/Motrin/Goody Powders/Naproxen/BC powders/Meloxicam/Diclofenac/Indomethacin and other Nonsteroidal anti-inflammatory medications as these will make you more likely to bleed and can cause stomach ulcers, can also cause Kidney problems.   2) please take Levaquin antibiotic as prescribed for E. coli urinary tract infection  3)Repeat  CBC and BMP blood test with primary care physician within the next 7 days advised

## 2020-05-06 NOTE — Telephone Encounter (Signed)
Pt recently in hospital for really bad UTI Hospital mentioned that we place pt on preventative antibiotics Please call Angelique Blonder, she's requesting an appointment for patient with Dr. Despina Hidden ASAP  [phone] Angelique Blonder  Came home from the hospital today with [facility]

## 2020-05-06 NOTE — Progress Notes (Signed)
SATURATION QUALIFICATIONS: (This note is used to comply with regulatory documentation for home oxygen)  Patient Saturations on Room Air at Rest = 97%  Patient Saturations on Room Air while Ambulating = 94%  

## 2020-05-06 NOTE — Telephone Encounter (Signed)
LMOVM returning call. Advise Dr Despina Hidden was not in the office the rest of thi s week but we could get her in on Monday for f/u.  Advised if she needed to be seen sooner, to call PCP.

## 2020-05-06 NOTE — Plan of Care (Signed)
  Problem: Acute Rehab OT Goals (only OT should resolve) Goal: Pt. Will Perform Grooming Flowsheets (Taken 05/06/2020 0955) Pt Will Perform Grooming:  with modified independence  sitting Goal: Pt. Will Perform Upper Body Dressing Flowsheets (Taken 05/06/2020 0955) Pt Will Perform Upper Body Dressing:  with modified independence  sitting Goal: Pt. Will Perform Lower Body Dressing Flowsheets (Taken 05/06/2020 0955) Pt Will Perform Lower Body Dressing:  with set-up  sitting/lateral leans  sit to/from stand  with adaptive equipment Goal: Pt. Will Transfer To Toilet Flowsheets (Taken 05/06/2020 [phone]) Pt Will Transfer to Toilet:  with modified independence  stand pivot transfer Goal: Pt/Caregiver Will Perform Home Exercise Program Flowsheets (Taken 05/06/2020 [phone]) Pt/caregiver will Perform Home Exercise Program:  Increased strength  Both right and left upper extremity  Independently

## 2020-05-06 NOTE — TOC Transition Note (Signed)
Transition of Care [facility]) - CM/SW Discharge Note   Patient Details  Name: Erica [name] MRN: [id number] Date of Birth: May 07, 1932  Transition of Care [facility]) CM/SW Contact:  Elliot Gault, LCSW Phone Number: 05/06/2020, 1:13 PM   Clinical Narrative:     Pt stable for dc home today per MD. Family has arranged for 24/7 care for pt. Spoke with pt's son and DIL to discuss referral for [facility] PT. They are in agreement. Discussed in-network CMS [facility] Provider options and referred to [facility] at family request. Per Bonita Quin, they will start with pt this week.  There are no other TOC needs identified for dc.  Final next level of care: Home w Home Health Services Barriers to Discharge: Barriers Resolved   Patient Goals and CMS Choice Patient states their goals for this hospitalization and ongoing recovery are:: go home CMS Medicare.gov Compare Post Acute Care list provided to:: Patient Represenative (must comment) Choice offered to / list presented to : Adult Children  Discharge Placement                       Discharge Plan and Services                          HH Arranged: PT HH Agency: [facility] Date HH Agency Contacted: 05/06/20   Representative spoke with at [facility] Agency: Bonita Quin  Social Determinants of Health (SDOH) Interventions     Readmission Risk Interventions No flowsheet data found.

## 2020-05-06 NOTE — Progress Notes (Signed)
Initial Nutrition Assessment  DOCUMENTATION CODES:   Not applicable  INTERVENTION:   -MVI with minerals daily -Ensure Enlive po BID, each supplement provides 350 kcal and 20 grams of protein  NUTRITION DIAGNOSIS:   Inadequate oral intake related to decreased appetite as evidenced by meal completion < 50%,per patient/family report.  GOAL:   Patient will meet greater than or equal to 90% of their needs  MONITOR:   PO intake,Supplement acceptance,Labs,Weight trends,Skin,I & O's  REASON FOR ASSESSMENT:   Malnutrition Screening Tool    ASSESSMENT:   Erica [name] is a 87 y.o. female with medical history significant of anxiety, bipolar disorder, hyperlipidemia, hypothyroidism, IBS, rheumatoid arthritis, hypothyroidism, GERD/hiatal hernia, history of frequent UTI who is coming to the emergency department due to fever for the past 4 to 5 days associated with chills, night sweats, fatigue, frontal headache, malaise, decreased appetite and overall oral intake.  She denies flank pain or dysuria, but has suprapubic discomfort and urinary frequency.  She saw GYN yesterday (Dr. Duane Lope) who prescribed 2 doses of fosfomycin for her after talking to infectious diseases (Dr. Earlene Plater).  However, the patient's symptoms have persisted despite taking the fosfomycin.  She denies rhinorrhea, sore throat, productive cough, wheezing or hemoptysis.  No chest pain, palpitations, PND, orthopnea or or recent pitting edema of the lower extremities.  The patient gets occasionally lightheaded.  Denies emesis, diarrhea, constipation, melena hematochezia.  No polyuria, polydipsia, polyphagia or blurred vision.  Pt admitted with sepsis secondary to UTI.   Reviewed I/O's: -1.7 L x 24 hours and +2.3 L since admission  UOP: 2.3 L x 24 hours  Spoke with pt at bedside, who was very sleepy and did not respond to most of this RD's questions. She endorses a poor appetite during hospitalization, but did not offer  further details despite probing.   Per meal completion records, meal completions 25-75%.   Reviewed wt hx; pt wt has been stable over the past 4 months.  Per MD notes, pt for possible discharge today with home health and palliative care services.   Medication reviewed and include diflucan.   Labs reviewed: CBGS: 110.  NUTRITION - FOCUSED PHYSICAL EXAM:  Flowsheet Row Most Recent Value  Orbital Region Mild depletion  Upper Arm Region No depletion  Thoracic and Lumbar Region No depletion  Buccal Region No depletion  Temple Region Mild depletion  Clavicle Bone Region No depletion  Clavicle and Acromion Bone Region No depletion  Scapular Bone Region No depletion  Dorsal Hand Mild depletion  Patellar Region Mild depletion  Anterior Thigh Region Mild depletion  Posterior Calf Region Mild depletion  Edema (RD Assessment) None  Hair Reviewed  Eyes Reviewed  Mouth Reviewed  Skin Reviewed  Nails Reviewed       Diet Order:   Diet Order            Diet Heart Room service appropriate? Yes; Fluid consistency: Thin  Diet effective now                 EDUCATION NEEDS:   Not appropriate for education at this time  Skin:  Skin Assessment: Reviewed RN Assessment  Last BM:  05/05/20  Height:   Ht Readings from Last 1 Encounters:  05/01/20 5' (1.524 m)    Weight:   Wt Readings from Last 1 Encounters:  05/01/20 54 kg    Ideal Body Weight:  45.5 kg  BMI:  Body mass index is 23.24 kg/m.  Estimated Nutritional Needs:  Kcal:  1450-1650  Protein:  65-80 grams  Fluid:  > 1.4 L    Levada Schilling, RD, LDN, CDCES Registered Dietitian II Certified Diabetes Care and Education Specialist Please refer to [facility] for RD and/or RD on-call/weekend/after hours pager

## 2020-05-06 NOTE — Evaluation (Signed)
Occupational Therapy Evaluation Patient Details Name: Erica [name] MRN: [id number] DOB: November 18, 1932 Today's Date: 05/06/2020    History of Present Illness Erica [name] is a 87 y.o. female with medical history significant of anxiety, bipolar disorder, hypothyroidism, IBS, RA, GERD/hiatal hernia, history of frequent UTI with current c/o fever for the past 4 to 5 days associated with chills, night sweats, fatigue, frontal headache, malaise, decreased appetite and overall oral intake.   Clinical Impression   Pt agreeable to OT evaluation. Pt side lying in bed with 3 LPM of 02 while reporting having difficulty breathing. Able to complete supine to sit with min assist. Moderate assist to don R LE walking boot at EOB. Minimal assist to complete stand pivot transfer to chair from EOB using SPC. Pt able to complete grooming tasks with set up assist. Pt will benefit from continued acute OT services to improve functional strength and endurance prior to d/c to the below location.     Follow Up Recommendations  Home health OT;Supervision/Assistance - 24 hour    Equipment Recommendations  None recommended by OT           Precautions / Restrictions Precautions Precautions: Fall Restrictions Weight Bearing Restrictions: Yes RLE Weight Bearing: Weight bearing as tolerated      Mobility Bed Mobility Overal bed mobility: Needs Assistance Bed Mobility: Supine to Sit     Supine to sit: Min assist     General bed mobility comments: Minimal assist via grasping hand of this therapist to go from supine to sit.    Transfers Overall transfer level: Needs assistance Equipment used: 1 person hand held assist;Straight cane Transfers: Sit to/from [employer] Sit to Stand: Min assist Stand pivot transfers: Min assist       General transfer comment: Minimal assist to steady w/ noted L side lean.    Balance Overall balance assessment: Needs assistance;History of  Falls Sitting-balance support: Feet supported Sitting balance-Leahy Scale: Good Sitting balance - Comments: seated EOB   Standing balance support: During functional activity;Single extremity supported Standing balance-Leahy Scale: Poor Standing balance comment: L side lean/ loss of balance                           ADL either performed or assessed with clinical judgement   ADL Overall ADL's : Needs assistance/impaired     Grooming: Oral care;Sitting;Set up;Wash/dry face Grooming Details (indicate cue type and reason): s/u assist seated in chair to brush teeth and wash face.             Lower Body Dressing: Moderate assistance;Sitting/lateral leans Lower Body Dressing Details (indicate cue type and reason): Moderate assist to don R walking boot at EOB. Toilet Transfer: Minimal assistance;Stand-pivot [facility]) Toilet Transfer Details (indicate cue type and reason): Simulated via stand pivot transfer from EOB to chair using SPC with minimal assist.                 Vision Baseline Vision/History: No visual deficits                  Pertinent Vitals/Pain Pain Assessment: No/denies pain     Hand Dominance Right   Extremity/Trunk Assessment Upper Extremity Assessment Upper Extremity Assessment: Generalized weakness (prior condition of RA)   Lower Extremity Assessment Lower Extremity Assessment: Defer to PT evaluation   Cervical / Trunk Assessment Cervical / Trunk Assessment: Kyphotic   Communication Communication Communication: No difficulties   Cognition Arousal/Alertness: Awake/alert Behavior  During Therapy: WFL for tasks assessed/performed Overall Cognitive Status: Within Functional Limits for tasks assessed                                     General Comments  3 LPM of nasal cannula               Home Living Family/patient expects to be discharged to:: Private residence Living Arrangements: Alone Available Help at  Discharge: Personal care attendant;Available PRN/intermittently (care attendent present from 9am to 6pm.) Type of Home: House Home Access: Stairs to enter [employer] of Steps: 2 Entrance Stairs-Rails: Right Home Layout: Two level;Able to live on main level with bedroom/bathroom     Bathroom Shower/Tub: Walk-in shower (2 to 3 inch lip to step over)   Bathroom Toilet: Handicapped height     Home Equipment: Walker - 2 wheels;Grab bars - tub/shower;Shower seat;Cane - single point;Wheelchair - [employer] - 4 wheels          Prior Functioning/Environment Level of Independence: Needs assistance  Gait / Transfers Assistance Needed: Pt reports mod I with SPC. Pt typically stays in the house. ADL's / Homemaking Assistance Needed: Pt reports aide provides cooking and cleaning, SUPV for bathing and dressing            OT Problem List: Decreased strength;Decreased activity tolerance;Impaired balance (sitting and/or standing)      OT Treatment/Interventions: Self-care/ADL training;Therapeutic exercise;Energy conservation;Therapeutic activities;Patient/family education;Balance training    OT Goals(Current goals can be found in the care plan section) Acute Rehab OT Goals Patient Stated Goal: Go home. OT Goal Formulation: With patient Time For Goal Achievement: 05/20/20 Potential to Achieve Goals: Good  OT Frequency: Min 2X/week       End of Session Equipment Utilized During Treatment:  [facility])  Activity Tolerance: Patient limited by fatigue (Pt limited by reports of shortness of breath and dizziness.) Patient left: in chair;with call bell/phone within reach  OT Visit Diagnosis: Unsteadiness on feet (R26.81);Repeated falls (R29.6);Muscle weakness (generalized) (M62.81);History of falling (Z91.81);Dizziness and giddiness (R42)                Time: 9390-3009 OT Time Calculation (min): 32 min Charges:  OT General Charges $OT Visit: 1 Visit  [facility] OT,  MOT   Danie Chandler 05/06/2020, 9:51 AM

## 2020-05-06 NOTE — Progress Notes (Signed)
MD Courage in to evaluate pt this am, removed O2. SaO2 on room air 15 minutes after O2 removed is 96%. Pt denies any c/o SOB, resp even and non-labored. Pt currently sitting in bed eating ice cream.

## 2020-05-06 NOTE — Discharge Summary (Signed)
Erica [name], is a 87 y.o. female  DOB 1932-03-17  MRN [id number].  Admission date:  05/01/2020  Admitting Physician  Bobette Mo, MD  Discharge Date:  05/06/2020   Primary MD  Aliene Beams, MD  Recommendations for primary care physician for things to follow:   1)Avoid ibuprofen/Advil/Aleve/Motrin/Goody Powders/Naproxen/BC powders/Meloxicam/Diclofenac/Indomethacin and other Nonsteroidal anti-inflammatory medications as these will make you more likely to bleed and can cause stomach ulcers, can also cause Kidney problems.   2)Please take Levaquin antibiotic as prescribed for E. coli urinary tract infection  3)Repeat  CBC and BMP blood test with primary care physician within the next 7 days advised  Admission Diagnosis  Generalized abdominal pain [R10.84] Acute cystitis without hematuria [N30.00] AKI (acute kidney injury) (HCC) [N17.9] Sepsis secondary to UTI (HCC) [A41.9, N39.0] Fever, unspecified fever cause [R50.9]   Discharge Diagnosis  Generalized abdominal pain [R10.84] Acute cystitis without hematuria [N30.00] AKI (acute kidney injury) (HCC) [N17.9] Sepsis secondary to UTI (HCC) [A41.9, N39.0] Fever, unspecified fever cause [R50.9]    Principal Problem:   Sepsis secondary to UTI (HCC) Active Problems:   GERD (gastroesophageal reflux disease)   Hypotension   Dehydration   Hypothyroidism   Hypertension   Hyponatremia   AKI (acute kidney injury) (HCC)   Hyperlipidemia   Hypokalemia   Macrocytic anemia   Protein-calorie malnutrition, mild (HCC)   Acute cystitis without hematuria   E coli bacteremia      Past Medical History:  Diagnosis Date  . Anxiety   . Bipolar disorder (HCC)   . Hyperlipidemia   . Hypothyroidism   . Irritable bowel syndrome   . RA (rheumatoid arthritis) (HCC)     Past Surgical History:  Procedure Laterality Date  . ABDOMINAL HYSTERECTOMY    .  Bone Spurs  08/07/13   Right Shoulder  . COLONOSCOPY    . COLONOSCOPY N/A 05/08/2012   Procedure: COLONOSCOPY;  Surgeon: Malissa Hippo, MD;  [facility];  Service: Endoscopy;  Laterality: N/A;  730  . COLOSTOMY    . ESOPHAGOGASTRODUODENOSCOPY N/A 11/04/2013   Procedure: ESOPHAGOGASTRODUODENOSCOPY (EGD);  Surgeon: Malissa Hippo, MD;  [facility];  Service: Endoscopy;  Laterality: N/A;  730  . EYE SURGERY  cataract x 2  . MALONEY DILATION N/A 11/04/2013   Procedure: Elease Hashimoto DILATION;  Surgeon: Malissa Hippo, MD;  [facility];  Service: Endoscopy;  Laterality: N/A;  . UPPER GASTROINTESTINAL ENDOSCOPY       HPI  from the history and physical done on the day of admission:    Chief Complaint: Fever.  HPI: Erica [name] is a 87 y.o. female with medical history significant of anxiety, bipolar disorder, hyperlipidemia, hypothyroidism, IBS, rheumatoid arthritis, hypothyroidism, GERD/hiatal hernia, history of frequent UTI who is coming to the emergency department due to fever for the past 4 to 5 days associated with chills, night sweats, fatigue, frontal headache, malaise, decreased appetite and overall oral intake.  She denies flank pain or dysuria, but has suprapubic  discomfort and urinary frequency.  She saw GYN yesterday (Dr. Duane Lope) who prescribed 2 doses of fosfomycin for her after talking to infectious diseases (Dr. Earlene Plater).  However, the patient's symptoms have persisted despite taking the fosfomycin.  She denies rhinorrhea, sore throat, productive cough, wheezing or hemoptysis.  No chest pain, palpitations, PND, orthopnea or or recent pitting edema of the lower extremities.  The patient gets occasionally lightheaded.  Denies emesis, diarrhea, constipation, melena hematochezia.  No polyuria, polydipsia, polyphagia or blurred vision.  ED Course: Initial vital signs were temperature 98.2 F, pulse 121, respirations 16, BP 105/55 mmHg O2 sat 94% on room  air.  The patient received a 500 mL normal saline bolus in the emergency department and 650 mg of acetaminophen.  Labwork: CBC showed a white count 20.2 with 79% neutrophils, hemoglobin 11.2 g/dL with an MCV of 588.3 fL and platelets 160.  Lactic acid was 1.8 mmol/L.  Lipase was normal.  Sodium 130, potassium 3.2, chloride 99 and CO2 21 mmol/L.  Calcium was 8.3, glucose 128, BUN 35 and creatinine 2.23 mmol/L.  Her calcium level normalizes after correction to albumin.  Hepatic function was normal, except for an albumin level of 3.0 g/dL.  Imaging: Portable chest radiograph did not show any active disease.  CT abdomen/pelvis without contrast showed mild bilateral hydronephrosis and proximal hydroureter ureter, without evidence of obstructing renal calculi.  There is a 3 mm nonobstructing renal stone within the mid left kidney.  There is bilateral perinephric inflammatory fat stranding which may represent sequela associated with acute pyonephritis.  There is a large, stable hiatal hernia.  Aortic atherosclerosis.  Please see images and full radiology report for further detail.  Review of Systems: As per HPI otherwise all other systems reviewed and are negative.     Hospital Course:    Brief Admission History:  87 y.o.femalewith medical history significant ofanxiety, bipolar disorder, hyperlipidemia, hypothyroidism, IBS, rheumatoid arthritis, hypothyroidism, GERD/hiatal hernia, history offrequentUTIwho is coming to the emergency department due to fever for the past 4 to 5 days associated with chills, night sweats, fatigue, frontal headache, malaise, decreased appetite and overall oral intake.  She saw GYN yesterday (Dr. Duane Lope) who prescribed 2 doses of fosfomycin for her after talking to infectious diseases (Dr. Earlene Plater). However, the patient's symptoms have persisted despite taking the fosfomycin.   Assessment & Plan:   Principal Problem:   Sepsis secondary to UTI [facility]) Active  Problems:   E coli bacteremia   GERD (gastroesophageal reflux disease)   Hypotension   Dehydration   Hypothyroidism   Hypertension   Hyponatremia   AKI (acute kidney injury) (HCC)   Hyperlipidemia   Hypokalemia   Macrocytic anemia   Protein-calorie malnutrition, mild (HCC)   Acute cystitis without hematuria   1)Sepsis secondary to MDR E coli UTI - Pt responded well to IV meropenem and now de-escalated to levofloxacin based on culture data as there was no ESBL resistance found on culture--- dc home on oral Levaquin -Sepsis pathophysiology has resolved  2)E coli bacteremia secondary to UTI- resistance to ceftriaxone, changed antibiotic to levofloxacin. -Please see #1 above -Leukocytosis has resolved  3)AKI - resolved with IV fluid, creatinine is back to 0.9 from 2.23 this admission  4)Right knee pain/generalized weakness and ambulatory dysfunction-- fall at home.  xrays are neg --- PT/OT eval appreciated  -Home health physical therapy requested  5)Acute Anemia--- suspect due to aggressive hydration/hemodilution, follow-up with PCP for repeat CBC  6)Hypothyroidism--- stable, continue levothyroxine  7)Acute Thrombocytopenia--resolved  with resolution of sepsis pathophysiology   Code Status: FULL  Family Communication:son telephone call to son updated 2/23 Disposition: -Home with home health and family support for 24/7 care   Dispo: The patient is from: Home  Anticipated d/c is to: Home with [facility]  Consultants:   Palliative care  Procedures:   N/a   Antimicrobials:  Meropenem 2/19-2/20 Ceftriaxone 2/20>>2/21 Levofloxacin 2/21>>  Discharge Condition: Stable  Follow UP   Follow-up Information    [place] Follow up.   Why: For outpatient palliative follow up.  Contact information: [address] [place] [address] [phone]              Diet and Activity recommendation:  As advised  Discharge Instructions     Discharge Instructions    Call MD for:  difficulty breathing, headache or visual disturbances   Complete by: As directed    Call MD for:  persistant dizziness or light-headedness   Complete by: As directed    Call MD for:  persistant nausea and vomiting   Complete by: As directed    Call MD for:  severe uncontrolled pain   Complete by: As directed    Call MD for:  temperature >100.4   Complete by: As directed    Diet - low sodium heart healthy   Complete by: As directed    Discharge instructions   Complete by: As directed    1)Avoid ibuprofen/Advil/Aleve/Motrin/Goody Powders/Naproxen/BC powders/Meloxicam/Diclofenac/Indomethacin and other Nonsteroidal anti-inflammatory medications as these will make you more likely to bleed and can cause stomach ulcers, can also cause Kidney problems.   2) please take Levaquin antibiotic as prescribed for E. coli urinary tract infection  3)Repeat  CBC and BMP blood test with primary care physician within the next 7 days advised   Increase activity slowly   Complete by: As directed        Discharge Medications     Allergies as of 05/06/2020      Reactions   Penicillins Itching, Rash   Bactrim [sulfamethoxazole-trimethoprim] Nausea Only   And diarrhea.   Hydrocodone    MAO-I ; cannot take medications.    Morphine And Related    Unable to take due to MAO-I      Medication List    STOP taking these medications   fluconazole 50 MG tablet Commonly known as: DIFLUCAN   fosfomycin 3 g Pack Commonly known as: MONUROL   Simethicone 180 MG Caps Commonly known as: Phazyme Ultra Strength   terconazole 0.4 % vaginal cream Commonly known as: TERAZOL 7     TAKE these medications   acetaminophen 650 MG CR tablet Commonly known as: TYLENOL Take 1,300 mg by mouth daily.   ARIPiprazole 5 MG tablet Commonly known as: ABILIFY Take 5 mg by mouth daily.   Centrum Silver 50+Women Tabs Take 1 tablet by mouth.   levofloxacin 500 MG  tablet Commonly known as: Levaquin Take 1 tablet (500 mg total) by mouth daily for 5 days.   LORazepam 0.5 MG tablet Commonly known as: ATIVAN Take 0.5 mg by mouth at bedtime.   losartan 50 MG tablet Commonly known as: COZAAR Take 75 mg by mouth See admin instructions. TAKE 75 IN MORNING AND 25 EVERY EVENING   metoprolol succinate 25 MG 24 hr tablet Commonly known as: TOPROL-XL Take 25 mg by mouth daily.   ondansetron 4 MG tablet Commonly known as: ZOFRAN Take 1 tablet (4 mg total) by mouth every 8 (eight) hours as  needed for nausea or vomiting.   pantoprazole 40 MG tablet Commonly known as: PROTONIX Take 1 tablet (40 mg total) by mouth daily before breakfast.   phenelzine 15 MG tablet Commonly known as: NARDIL Take 15 mg by mouth 2 (two) times daily.   polyethylene glycol powder 17 GM/SCOOP powder Commonly known as: GLYCOLAX/MIRALAX 17 gram po qd as directed.   polyvinyl alcohol 1.4 % ophthalmic solution Commonly known as: LIQUIFILM TEARS Place one drop into both eyes 4 (four) times a day as needed for up to 30 days.   Probiotic (Lactobacillus) Caps Take 1 capsule by mouth daily.   Synthroid 75 MCG tablet Generic drug: levothyroxine Take 1 tablet (75 mcg total) by mouth daily.   VITAMIN B-12 PO Take by mouth daily.      Major procedures and Radiology Reports - PLEASE review detailed and final reports for all details, in brief -   CT Abdomen Pelvis Wo Contrast  Result Date: 05/02/2020 CLINICAL DATA:  Fever and recent urinary tract infection. EXAM: CT ABDOMEN AND PELVIS WITHOUT CONTRAST TECHNIQUE: Multidetector CT imaging of the abdomen and pelvis was performed following the standard protocol without IV contrast. COMPARISON:  December 26, 2019 FINDINGS: Lower chest: Mild linear scarring and/or atelectasis is seen within the bilateral lung bases. Hepatobiliary: No focal liver abnormality is seen. No gallstones, gallbladder wall thickening, or biliary dilatation.  Pancreas: Unremarkable. No pancreatic ductal dilatation or surrounding inflammatory changes. Spleen: Normal in size without focal abnormality. Adrenals/Urinary Tract: Adrenal glands are unremarkable. Kidneys are normal in size, without obstructing renal calculi or focal lesions. Mild bilateral hydronephrosis and proximal hydroureter are seen. A 3 mm nonobstructing renal stone is seen within the posterior aspect of the mid left kidney. Mild, bilateral perinephric inflammatory fat stranding is seen. This represents a new finding when compared to the prior study. Bladder is unremarkable. Stomach/Bowel: There is a large, stable hiatal hernia. The appendix is not visualized. Surgically anastomosed bowel is seen within the expected region of the proximal sigmoid colon. No evidence of bowel wall thickening, distention, or inflammatory changes. Vascular/Lymphatic: Aortic atherosclerosis. No enlarged abdominal or pelvic lymph nodes. Reproductive: Status post hysterectomy. No adnexal masses. Other: No abdominal wall hernia or abnormality. No abdominopelvic ascites. Musculoskeletal: There is marked severity levoscoliosis of the lumbar spine with multilevel degenerative changes. IMPRESSION: 1. Mild bilateral hydronephrosis and proximal hydroureter, without evidence of obstructing renal calculi. 2. 3 mm nonobstructing renal stone within the mid left kidney. 3. Bilateral perinephric inflammatory fat stranding which may represent sequelae associated with acute pyelonephritis. 4. Large, stable hiatal hernia. 5. Aortic atherosclerosis. Aortic Atherosclerosis (ICD10-I70.0). Electronically Signed   By: Aram Candela M.D.   On: 05/02/2020 02:04   DG Chest Port 1 View  Result Date: 05/01/2020 CLINICAL DATA:  Fever EXAM: PORTABLE CHEST 1 VIEW COMPARISON:  05/07/2017 FINDINGS: The heart size and mediastinal contours are within normal limits. Both lungs are clear. The visualized skeletal structures are unremarkable. IMPRESSION: No  active disease. Electronically Signed   By: Deatra Robinson M.D.   On: 05/01/2020 23:23   DG Knee Complete 4 Views Right  Result Date: 05/05/2020 CLINICAL DATA:  Right knee pain after fall. EXAM: RIGHT KNEE - COMPLETE 4+ VIEW COMPARISON:  None. FINDINGS: No acute fracture or dislocation. No joint effusion. Very early medial compartment joint space narrowing with tiny marginal osteophytes. Osteopenia. Soft tissues are unremarkable. IMPRESSION: 1. No acute osseous abnormality. Electronically Signed   By: Obie Dredge M.D.   On: 05/05/2020 14:16  Micro Results   Recent Results (from the past 240 hour(s))  Urine Culture     Status: Abnormal   Collection Time: 04/30/20  1:37 PM   Specimen: Urine   UR  Result Value Ref Range Status   Urine Culture, Routine Final report (A)  Final   Organism ID, Bacteria Escherichia coli (A)  Final    Comment: Multi-Drug Resistant Organism Greater than 100,000 colony forming units per mL Susceptibility profile is consistent with a probable ESBL.    Antimicrobial Susceptibility Comment  Final    Comment:       ** S = Susceptible; I = Intermediate; R = Resistant **                    P = Positive; N = Negative             MICS are expressed in micrograms per mL    Antibiotic                 RSLT#1    RSLT#2    RSLT#3    RSLT#4 Amoxicillin/Clavulanic Acid    R Ampicillin                     R Cefazolin                      R Cefepime                       S Ceftriaxone                    R Cefuroxime                     R Ciprofloxacin                  S Gentamicin                     S Imipenem                       S Levofloxacin                   S Meropenem                      S Nitrofurantoin                 S Piperacillin/Tazobactam        S Tetracycline                   R Tobramycin                     S Trimethoprim/Sulfa             S   Culture, blood (Routine X 2) w Reflex to ID Panel     Status: Abnormal   Collection Time: 05/01/20  10:58 PM   Specimen: Left Antecubital; Blood  Result Value Ref Range Status   Specimen Description   Final    LEFT ANTECUBITAL Performed at [facility], [address] [place], [place] [address]    Special Requests   Final    BOTTLES DRAWN AEROBIC AND ANAEROBIC Blood Culture adequate volume Performed at [facility], [address], [place], [place] [address]    Culture  Setup Time  Final    GRAM NEGATIVE RODS AEROBIC BOTTLE ONLY Gram Stain Report Called to,Read Back By and Verified With: BRANDY MCMILLIAN AT 1705 BY HFLYNT 05/02/20 CRITICAL RESULT CALLED TO, READ BACK BY AND VERIFIED WITH: M,CULLEN RN @2043  05/02/20 EB Performed at [facility], [address], [place], [place]    Culture ESCHERICHIA COLI (A)  Final   Report Status 05/04/2020 FINAL  Final   Organism ID, Bacteria ESCHERICHIA COLI  Final      Susceptibility   Escherichia coli - MIC*    AMPICILLIN >=32 RESISTANT Resistant     CEFAZOLIN >=64 RESISTANT Resistant     CEFEPIME 0.5 SENSITIVE Sensitive     CEFTAZIDIME 16 INTERMEDIATE Intermediate     CEFTRIAXONE >=64 RESISTANT Resistant     CIPROFLOXACIN <=0.25 SENSITIVE Sensitive     GENTAMICIN <=1 SENSITIVE Sensitive     IMIPENEM <=0.25 SENSITIVE Sensitive     TRIMETH/SULFA <=20 SENSITIVE Sensitive     AMPICILLIN/SULBACTAM >=32 RESISTANT Resistant     PIP/TAZO 8 SENSITIVE Sensitive     * ESCHERICHIA COLI  Blood Culture ID Panel (Reflexed)     Status: Abnormal   Collection Time: 05/01/20 10:58 PM  Result Value Ref Range Status   Enterococcus faecalis NOT DETECTED NOT DETECTED Final   Enterococcus Faecium NOT DETECTED NOT DETECTED Final   Listeria monocytogenes NOT DETECTED NOT DETECTED Final   Staphylococcus species NOT DETECTED NOT DETECTED Final   Staphylococcus aureus (BCID) NOT DETECTED NOT DETECTED Final   Staphylococcus epidermidis NOT DETECTED NOT DETECTED Final   Staphylococcus lugdunensis NOT DETECTED NOT DETECTED Final   Streptococcus  species NOT DETECTED NOT DETECTED Final   Streptococcus agalactiae NOT DETECTED NOT DETECTED Final   Streptococcus pneumoniae NOT DETECTED NOT DETECTED Final   Streptococcus pyogenes NOT DETECTED NOT DETECTED Final   A.calcoaceticus-baumannii NOT DETECTED NOT DETECTED Final   Bacteroides fragilis NOT DETECTED NOT DETECTED Final   Enterobacterales DETECTED (A) NOT DETECTED Final    Comment: Enterobacterales represent a large order of gram negative bacteria, not a single organism. CRITICAL RESULT CALLED TO, READ BACK BY AND VERIFIED WITH: M,CULLEN RN @2043  05/02/20 EB    Enterobacter cloacae complex NOT DETECTED NOT DETECTED Final   Escherichia coli DETECTED (A) NOT DETECTED Final    Comment: CRITICAL RESULT CALLED TO, READ BACK BY AND VERIFIED WITH: M,CULLEN RN @2043  05/02/20 EB    Klebsiella aerogenes NOT DETECTED NOT DETECTED Final   Klebsiella oxytoca NOT DETECTED NOT DETECTED Final   Klebsiella pneumoniae NOT DETECTED NOT DETECTED Final   Proteus species NOT DETECTED NOT DETECTED Final   Salmonella species NOT DETECTED NOT DETECTED Final   Serratia marcescens NOT DETECTED NOT DETECTED Final   Haemophilus influenzae NOT DETECTED NOT DETECTED Final   Neisseria meningitidis NOT DETECTED NOT DETECTED Final   Pseudomonas aeruginosa NOT DETECTED NOT DETECTED Final   Stenotrophomonas maltophilia NOT DETECTED NOT DETECTED Final   Candida albicans NOT DETECTED NOT DETECTED Final   Candida auris NOT DETECTED NOT DETECTED Final   Candida glabrata NOT DETECTED NOT DETECTED Final   Candida krusei NOT DETECTED NOT DETECTED Final   Candida parapsilosis NOT DETECTED NOT DETECTED Final   Candida tropicalis NOT DETECTED NOT DETECTED Final   Cryptococcus neoformans/gattii NOT DETECTED NOT DETECTED Final   CTX-M ESBL NOT DETECTED NOT DETECTED Final   Carbapenem resistance IMP NOT DETECTED NOT DETECTED Final   Carbapenem resistance KPC NOT DETECTED NOT DETECTED Final   Carbapenem resistance NDM NOT  DETECTED  NOT DETECTED Final   Carbapenem resist OXA 48 LIKE NOT DETECTED NOT DETECTED Final   Carbapenem resistance VIM NOT DETECTED NOT DETECTED Final    Comment: Performed at [facility], [address] [place], [place] [address]  Culture, blood (Routine X 2) w Reflex to ID Panel     Status: Abnormal   Collection Time: 05/01/20 11:23 PM   Specimen: BLOOD RIGHT ARM  Result Value Ref Range Status   Specimen Description   Final    BLOOD RIGHT ARM Performed at [facility], [address], [place], [place] [address]    Special Requests   Final    BOTTLES DRAWN AEROBIC ONLY Blood Culture adequate volume Performed at [facility], [address], [place], [place] [address]    Culture  Setup Time   Final    GRAM NEGATIVE RODS Gram Stain Report Called to,Read Back By and Verified With: BONDURANT,R @ 0410 ON 05/03/20 BY JUW AEROBIC BOTTLE ONLY GS DONE @ [facility]    Culture (A)  Final    ESCHERICHIA COLI SUSCEPTIBILITIES PERFORMED ON PREVIOUS CULTURE WITHIN THE LAST 5 DAYS. Performed at [facility], [address] [place], [place] [address]    Report Status 05/05/2020 FINAL  Final  SARS CORONAVIRUS 2 (TAT 6-24 HRS) Nasopharyngeal Nasopharyngeal Swab     Status: None   Collection Time: 05/02/20  3:58 AM   Specimen: Nasopharyngeal Swab  Result Value Ref Range Status   SARS Coronavirus 2 NEGATIVE NEGATIVE Final    Comment: (NOTE) SARS-CoV-2 target nucleic acids are NOT DETECTED.  The SARS-CoV-2 RNA is generally detectable in upper and lower respiratory specimens during the acute phase of infection. Negative results do not preclude SARS-CoV-2 infection, do not rule out co-infections with other pathogens, and should not be used as the sole basis for treatment or other patient management decisions. Negative results must be combined with clinical observations, patient history, and epidemiological information. The expected result is Negative.  Fact Sheet for  Patients: HairSlick.no  Fact Sheet for Healthcare Providers: quierodirigir.com  This test is not yet approved or cleared by the [place] FDA and  has been authorized for detection and/or diagnosis of SARS-CoV-2 by FDA under an Emergency Use Authorization (EUA). This EUA will remain  in effect (meaning this test can be used) for the duration of the COVID-19 declaration under Se ction 564(b)(1) of the Act, 21 U.S.C. section 360bbb-3(b)(1), unless the authorization is terminated or revoked sooner.  Performed at [facility], [address], [place], [place] [address]   Culture, Urine     Status: None   Collection Time: 05/02/20  8:42 AM   Specimen: Urine, Clean Catch  Result Value Ref Range Status   Specimen Description   Final    URINE, CLEAN CATCH Performed at [facility], [address] [place], [place] [address]    Special Requests   Final    NONE Performed at [facility], [address], [place], [place] [address]    Culture   Final    NO GROWTH Performed at [facility], [address], [place], [place] [address]    Report Status 05/03/2020 FINAL  Final    Today   Subjective    Nakeyia Menden today has no new complaints -Ambulated in hallway with O2 sats of 94 to 96% on room air -No fever  Or chills   No Nausea, Vomiting or Diarrhea  Patient has been seen and examined prior to discharge   Objective   Blood pressure 139/77, pulse (!) 102, temperature 97.6 F (36.4 C), temperature source Oral, resp. rate 18, height 5' (1.524 m), weight 54 kg, SpO2 94 %.   Intake/Output Summary (Last 24 hours) at 05/06/2020 1156 Last data filed at 05/06/2020 0900 Gross per 24 hour  Intake 420 ml  Output 1950 ml  Net -1530 ml   Exam Gen:- Awake Alert, no acute distress  HEENT:- [place].AT, No sclera icterus Neck-Supple Neck,No JVD,.  Lungs-improved air movement, no wheezing CV- S1, S2 normal,  regular Abd-  +ve B.Sounds, Abd Soft, No tenderness,   Extremity/Skin:- No  edema,   good pulses Psych-affect is appropriate, oriented x3 Neuro-generalized weakness , no new focal deficits, no tremors    Data Review   CBC w Diff:  Lab Results  Component Value Date   WBC 8.7 05/06/2020   HGB 9.1 (L) 05/06/2020   HCT 28.4 (L) 05/06/2020   HCT 45 11/28/2016   PLT 173 05/06/2020   PLT 227 11/28/2016   LYMPHOPCT 7 05/03/2020   MONOPCT 12 05/03/2020   EOSPCT 0 05/03/2020   BASOPCT 0 05/03/2020   CMP:  Lab Results  Component Value Date   NA 136 05/03/2020   NA 141 11/28/2016   K 3.8 05/03/2020   K 4.0 11/28/2016   CL 108 05/03/2020   CL 101 11/28/2016   CO2 22 05/03/2020   CO2 22 11/28/2016   BUN 17 05/03/2020   BUN 11 11/28/2016   CREATININE 0.93 05/03/2020   CREATININE 0.69 06/21/2017   PROT 6.5 05/01/2020   ALBUMIN 3.0 (L) 05/01/2020   BILITOT 0.8 05/01/2020   ALKPHOS 77 05/01/2020   AST 21 05/01/2020   ALT 16 05/01/2020   Total Discharge time is about 33 minutes  Shon Hale M.D on 05/06/2020 at 11:56 AM  Go to [email or web address] -  for contact info  [facility] - Office  [phone]

## 2020-05-07 ENCOUNTER — Ambulatory Visit (INDEPENDENT_AMBULATORY_CARE_PROVIDER_SITE_OTHER): Payer: Medicare Other | Admitting: Gastroenterology

## 2020-05-08 DIAGNOSIS — E876 Hypokalemia: Secondary | ICD-10-CM | POA: Diagnosis not present

## 2020-05-08 DIAGNOSIS — Z1612 Extended spectrum beta lactamase (ESBL) resistance: Secondary | ICD-10-CM | POA: Diagnosis not present

## 2020-05-08 DIAGNOSIS — M25561 Pain in right knee: Secondary | ICD-10-CM | POA: Diagnosis not present

## 2020-05-08 DIAGNOSIS — I10 Essential (primary) hypertension: Secondary | ICD-10-CM | POA: Diagnosis not present

## 2020-05-08 DIAGNOSIS — E871 Hypo-osmolality and hyponatremia: Secondary | ICD-10-CM | POA: Diagnosis not present

## 2020-05-08 DIAGNOSIS — E441 Mild protein-calorie malnutrition: Secondary | ICD-10-CM | POA: Diagnosis not present

## 2020-05-08 DIAGNOSIS — N136 Pyonephrosis: Secondary | ICD-10-CM | POA: Diagnosis not present

## 2020-05-08 DIAGNOSIS — I959 Hypotension, unspecified: Secondary | ICD-10-CM | POA: Diagnosis not present

## 2020-05-08 DIAGNOSIS — B9629 Other Escherichia coli [E. coli] as the cause of diseases classified elsewhere: Secondary | ICD-10-CM | POA: Diagnosis not present

## 2020-05-08 DIAGNOSIS — D539 Nutritional anemia, unspecified: Secondary | ICD-10-CM | POA: Diagnosis not present

## 2020-05-11 DIAGNOSIS — E860 Dehydration: Secondary | ICD-10-CM | POA: Diagnosis not present

## 2020-05-11 DIAGNOSIS — N390 Urinary tract infection, site not specified: Secondary | ICD-10-CM | POA: Diagnosis not present

## 2020-05-11 DIAGNOSIS — Z9289 Personal history of other medical treatment: Secondary | ICD-10-CM | POA: Diagnosis not present

## 2020-05-11 DIAGNOSIS — I10 Essential (primary) hypertension: Secondary | ICD-10-CM | POA: Diagnosis not present

## 2020-05-12 DIAGNOSIS — S82831A Other fracture of upper and lower end of right fibula, initial encounter for closed fracture: Secondary | ICD-10-CM | POA: Diagnosis not present

## 2020-05-12 DIAGNOSIS — S82839A Other fracture of upper and lower end of unspecified fibula, initial encounter for closed fracture: Secondary | ICD-10-CM | POA: Diagnosis not present

## 2020-05-18 DIAGNOSIS — F3341 Major depressive disorder, recurrent, in partial remission: Secondary | ICD-10-CM | POA: Diagnosis not present

## 2020-05-19 DIAGNOSIS — M0589 Other rheumatoid arthritis with rheumatoid factor of multiple sites: Secondary | ICD-10-CM | POA: Diagnosis not present

## 2020-05-19 DIAGNOSIS — M65341 Trigger finger, right ring finger: Secondary | ICD-10-CM | POA: Diagnosis not present

## 2020-05-19 DIAGNOSIS — Z79899 Other long term (current) drug therapy: Secondary | ICD-10-CM | POA: Diagnosis not present

## 2020-05-19 DIAGNOSIS — M720 Palmar fascial fibromatosis [Dupuytren]: Secondary | ICD-10-CM | POA: Diagnosis not present

## 2020-05-25 ENCOUNTER — Ambulatory Visit (INDEPENDENT_AMBULATORY_CARE_PROVIDER_SITE_OTHER): Payer: Medicare Other | Admitting: Urology

## 2020-05-25 ENCOUNTER — Encounter: Payer: Self-pay | Admitting: Urology

## 2020-05-25 ENCOUNTER — Other Ambulatory Visit: Payer: Self-pay

## 2020-05-25 VITALS — BP 153/81 | HR 109 | Temp 97.7°F | Ht 60.0 in | Wt 119.0 lb

## 2020-05-25 DIAGNOSIS — N200 Calculus of kidney: Secondary | ICD-10-CM | POA: Insufficient documentation

## 2020-05-25 DIAGNOSIS — N390 Urinary tract infection, site not specified: Secondary | ICD-10-CM | POA: Diagnosis not present

## 2020-05-25 LAB — MICROSCOPIC EXAMINATION
Epithelial Cells (non renal): >10 /hpf — AB (ref 0–10)
RBC, Urine: NONE SEEN /hpf (ref 0–2)
Renal Epithel, UA: NONE SEEN /hpf

## 2020-05-25 LAB — URINALYSIS, ROUTINE W REFLEX MICROSCOPIC
Bilirubin, UA: NEGATIVE
Glucose, UA: NEGATIVE
Ketones, UA: NEGATIVE
Nitrite, UA: NEGATIVE
Protein,UA: NEGATIVE
RBC, UA: NEGATIVE
Specific Gravity, UA: <1.005 — ABNORMAL LOW (ref 1.005–1.030)
Urobilinogen, Ur: 0.2 mg/dL (ref 0.2–1.0)
pH, UA: 5.5 (ref 5.0–7.5)

## 2020-05-25 LAB — BLADDER SCAN AMB NON-IMAGING: Scan Result: 20

## 2020-05-25 MED ORDER — NITROFURANTOIN MACROCRYSTAL 50 MG PO CAPS: 50.0000 mg | ORAL_CAPSULE | Freq: Every day | ORAL | 3 refills | Status: DC

## 2020-05-25 NOTE — Patient Instructions (Signed)
Urinary Tract Infection, Adult  A urinary tract infection (UTI) is an infection of any part of the urinary tract. The urinary tract includes the kidneys, ureters, bladder, and urethra. These organs make, store, and get rid of urine in the body. An upper UTI affects the ureters and kidneys. A lower UTI affects the bladder and urethra. What are the causes? Most urinary tract infections are caused by bacteria in your genital area around your urethra, where urine leaves your body. These bacteria grow and cause inflammation of your urinary tract. What increases the risk? You are more likely to develop this condition if:  You have a urinary catheter that stays in place.  You are not able to control when you urinate or have a bowel movement (incontinence).  You are female and you: ? Use a spermicide or diaphragm for birth control. ? Have low estrogen levels. ? Are pregnant.  You have certain genes that increase your risk.  You are sexually active.  You take antibiotic medicines.  You have a condition that causes your flow of urine to slow down, such as: ? An enlarged prostate, if you are female. ? Blockage in your urethra. ? A kidney stone. ? A nerve condition that affects your bladder control (neurogenic bladder). ? Not getting enough to drink, or not urinating often.  You have certain medical conditions, such as: ? Diabetes. ? A weak disease-fighting system (immunesystem). ? Sickle cell disease. ? Gout. ? Spinal cord injury. What are the signs or symptoms? Symptoms of this condition include:  Needing to urinate right away (urgency).  Frequent urination. This may include small amounts of urine each time you urinate.  Pain or burning with urination.  Blood in the urine.  Urine that smells bad or unusual.  Trouble urinating.  Cloudy urine.  Vaginal discharge, if you are female.  Pain in the abdomen or the lower back. You may also have:  Vomiting or a decreased  appetite.  Confusion.  Irritability or tiredness.  A fever or chills.  Diarrhea. The first symptom in older adults may be confusion. In some cases, they may not have any symptoms until the infection has worsened. How is this diagnosed? This condition is diagnosed based on your medical history and a physical exam. You may also have other tests, including:  Urine tests.  Blood tests.  Tests for STIs (sexually transmitted infections). If you have had more than one UTI, a cystoscopy or imaging studies may be done to determine the cause of the infections. How is this treated? Treatment for this condition includes:  Antibiotic medicine.  Over-the-counter medicines to treat discomfort.  Drinking enough water to stay hydrated. If you have frequent infections or have other conditions such as a kidney stone, you may need to see a health care provider who specializes in the urinary tract (urologist). In rare cases, urinary tract infections can cause sepsis. Sepsis is a life-threatening condition that occurs when the body responds to an infection. Sepsis is treated in the hospital with IV antibiotics, fluids, and other medicines. Follow these instructions at home: Medicines  Take over-the-counter and prescription medicines only as told by your health care provider.  If you were prescribed an antibiotic medicine, take it as told by your health care provider. Do not stop using the antibiotic even if you start to feel better. General instructions  Make sure you: ? Empty your bladder often and completely. Do not hold urine for long periods of time. ? Empty your bladder after   sex. ? Wipe from front to back after urinating or having a bowel movement if you are female. Use each tissue only one time when you wipe.  Drink enough fluid to keep your urine pale yellow.  Keep all follow-up visits. This is important.   Contact a health care provider if:  Your symptoms do not get better after 1-2  days.  Your symptoms go away and then return. Get help right away if:  You have severe pain in your back or your lower abdomen.  You have a fever or chills.  You have nausea or vomiting. Summary  A urinary tract infection (UTI) is an infection of any part of the urinary tract, which includes the kidneys, ureters, bladder, and urethra.  Most urinary tract infections are caused by bacteria in your genital area.  Treatment for this condition often includes antibiotic medicines.  If you were prescribed an antibiotic medicine, take it as told by your health care provider. Do not stop using the antibiotic even if you start to feel better.  Keep all follow-up visits. This is important. This information is not intended to replace advice given to you by your health care provider. Make sure you discuss any questions you have with your health care provider. Document Revised: 10/11/2019 Document Reviewed: 10/11/2019 Elsevier Patient Education  2021 Elsevier Inc.  

## 2020-05-25 NOTE — Progress Notes (Signed)
Bladder Scan Patient can void: 20 ml Performed By: Bridgette Habermann, lpn   Urological Symptom Review  Patient is experiencing the following symptoms: Hard to postpone urination Get up at night to urinate Leakage of urine Stream starts and stops Trouble starting stream Urinary tract infection Weak stream   Review of Systems  Gastrointestinal (upper)  : Indigestion/heartburn  Gastrointestinal (lower) : Constipation  Constitutional : Fatigue  Skin: Negative for skin symptoms  Eyes: Negative for eye symptoms  Ear/Nose/Throat : Negative for Ear/Nose/Throat symptoms  Hematologic/Lymphatic: Easy bruising  Cardiovascular : Negative for cardiovascular symptoms  Respiratory : Shortness of breath  Endocrine: Negative for endocrine symptoms  Musculoskeletal: Back pain  Neurological: Negative  Psychologic: Depression Anxiety

## 2020-05-25 NOTE — Progress Notes (Signed)
05/25/2020 11:47 AM   Erica [name] 1932/11/03 [id number]  Referring provider: Aliene Beams, MD [phone] [address] [place],  [place] [address]  Recurrent UTI  HPI: Erica [name] is a 87yo here for evaluation of recurrent UTI. She has had 4 documented UTIs since Oct 2021. She was hospitalize in 04/2020 for UTI and bilateral pyelonephritis. She was placed on macrobid 50mg  qhs. She uses 2 pads per day and 1 at night. The pads are damp when she changes them. She uses miralax prn for constipation. No complaints today. She is tolerating the macrobid   CT from 04/2020 also showed an incidental 4mm left renal calculus   PMH: Past Medical History:  Diagnosis Date  . Anxiety   . Bipolar disorder (HCC)   . Hyperlipidemia   . Hypothyroidism   . Irritable bowel syndrome   . RA (rheumatoid arthritis) (HCC)     Surgical History: Past Surgical History:  Procedure Laterality Date  . ABDOMINAL HYSTERECTOMY    . Bone Spurs  08/07/13   Right Shoulder  . COLONOSCOPY    . COLONOSCOPY N/A 05/08/2012   Procedure: COLONOSCOPY;  Surgeon: 05/10/2012, MD;  [facility];  Service: Endoscopy;  Laterality: N/A;  730  . COLOSTOMY    . ESOPHAGOGASTRODUODENOSCOPY N/A 11/04/2013   Procedure: ESOPHAGOGASTRODUODENOSCOPY (EGD);  Surgeon: 11/06/2013, MD;  [facility]: [facility];  Service: Endoscopy;  Laterality: N/A;  730  . EYE SURGERY  cataract x 2  . MALONEY DILATION N/A 11/04/2013   Procedure: 11/06/2013 DILATION;  Surgeon: Elease Hashimoto, MD;  [facility]: [facility];  Service: Endoscopy;  Laterality: N/A;  . UPPER GASTROINTESTINAL ENDOSCOPY      Home Medications:  Allergies as of 05/25/2020      Reactions   Penicillins Itching, Rash   Hydrocodone Other (See Comments)   MAO-I ; cannot take medications.    Morphine And Related    Unable to take due to MAO-I   Morphine Sulfate Other (See Comments)   Sulfamethoxazole-trimethoprim Nausea Only, Other (See Comments)   And diarrhea. And  diarrhea.      Medication List       Accurate as of May 25, 2020 11:47 AM. If you have any questions, ask your nurse or doctor.        STOP taking these medications   Centrum Silver 50+Women Tabs Stopped by: May 27, 2020, MD   Synthroid 75 MCG tablet Generic drug: levothyroxine Stopped by: Wilkie Aye, MD   Synthroid 88 MCG tablet Generic drug: levothyroxine Stopped by: Wilkie Aye, MD   VITAMIN B-12 PO Stopped by: Wilkie Aye, MD     TAKE these medications   acetaminophen 650 MG CR tablet Commonly known as: TYLENOL Take 1,300 mg by mouth daily.   ARIPiprazole 5 MG tablet Commonly known as: ABILIFY Take 5 mg by mouth daily.   L-Methylfolate Forte 15-90.314 MG Caps   LORazepam 0.5 MG tablet Commonly known as: ATIVAN Take 0.5 mg by mouth at bedtime.   losartan 50 MG tablet Commonly known as: COZAAR Take 75 mg by mouth See admin instructions. TAKE 75 IN MORNING AND 25 EVERY EVENING   metoprolol succinate 25 MG 24 hr tablet Commonly known as: TOPROL-XL Take 25 mg by mouth daily.   nitrofurantoin 50 MG capsule Commonly known as: MACRODANTIN Take 50 mg by mouth at bedtime.   ondansetron 4 MG tablet Commonly known as: ZOFRAN Take 1 tablet (4 mg total) by mouth every 8 (eight) hours  as needed for nausea or vomiting.   pantoprazole 40 MG tablet Commonly known as: PROTONIX Take 1 tablet (40 mg total) by mouth daily before breakfast.   phenelzine 15 MG tablet Commonly known as: NARDIL Take 15 mg by mouth 2 (two) times daily.   polyethylene glycol powder 17 GM/SCOOP powder Commonly known as: GLYCOLAX/MIRALAX 17 gram po qd as directed.   polyvinyl alcohol 1.4 % ophthalmic solution Commonly known as: LIQUIFILM TEARS Place one drop into both eyes 4 (four) times a day as needed for up to 30 days.   Probiotic (Lactobacillus) Caps Take 1 capsule by mouth daily.       Allergies:  Allergies  Allergen Reactions  . Penicillins Itching  and Rash  . Hydrocodone Other (See Comments)    MAO-I ; cannot take medications.   . Morphine And Related     Unable to take due to MAO-I  . Morphine Sulfate Other (See Comments)  . Sulfamethoxazole-Trimethoprim Nausea Only and Other (See Comments)    And diarrhea. And diarrhea.     Family History: Family History  Problem Relation Age of Onset  . Colon cancer Mother   . Bipolar disorder Mother   . Anxiety disorder Mother   . Atrial fibrillation Son   . Depression Maternal Grandmother     Social History:  reports that she quit smoking about 39 years ago. Her smoking use included cigarettes. She has a 40.50 pack-year smoking history. She quit smokeless tobacco use about 39 years ago. She reports current alcohol use of about 1.0 standard drink of alcohol per week. She reports that she does not use drugs.  ROS: All other review of systems were reviewed and are negative except what is noted above in HPI  Physical Exam: BP (!) 153/81   Pulse (!) 109   Temp 97.7 F (36.5 C)   Ht 5' (1.524 m)   Wt 119 lb (54 kg)   BMI 23.24 kg/m   Constitutional:  Alert and oriented, No acute distress. HEENT: [place] AT, moist mucus membranes.  Trachea midline, no masses. Cardiovascular: No clubbing, cyanosis, or edema. Respiratory: Normal respiratory effort, no increased work of breathing. GI: Abdomen is soft, nontender, nondistended, no abdominal masses GU: No CVA tenderness.  Lymph: No cervical or inguinal lymphadenopathy. Skin: No rashes, bruises or suspicious lesions. Neurologic: Grossly intact, no focal deficits, moving all 4 extremities. Psychiatric: Normal mood and affect.  Laboratory Data: Lab Results  Component Value Date   WBC 8.7 05/06/2020   HGB 9.1 (L) 05/06/2020   HCT 28.4 (L) 05/06/2020   MCV 100.4 (H) 05/06/2020   PLT 173 05/06/2020    Lab Results  Component Value Date   CREATININE 0.93 05/03/2020    No results found for: PSA  No results found for:  TESTOSTERONE  No results found for: HGBA1C  Urinalysis    Component Value Date/Time   COLORURINE STRAW (A) 05/02/2020 0842   APPEARANCEUR CLEAR 05/02/2020 0842   APPEARANCEUR Clear 11/28/2016 1538   LABSPEC 1.003 (L) 05/02/2020 0842   PHURINE 6.0 05/02/2020 0842   GLUCOSEU NEGATIVE 05/02/2020 0842   HGBUR MODERATE (A) 05/02/2020 0842   BILIRUBINUR NEGATIVE 05/02/2020 0842   BILIRUBINUR Negative 11/28/2016 1538   KETONESUR NEGATIVE 05/02/2020 0842   PROTEINUR NEGATIVE 05/02/2020 0842   NITRITE NEGATIVE 05/02/2020 0842   LEUKOCYTESUR SMALL (A) 05/02/2020 0842    Lab Results  Component Value Date   BACTERIA NONE SEEN 05/02/2020    Pertinent Imaging:  No results found for  this or any previous visit.  No results found for this or any previous visit.  No results found for this or any previous visit.  No results found for this or any previous visit.  No results found for this or any previous visit.  No results found for this or any previous visit.  No results found for this or any previous visit.  Results for orders placed during the hospital encounter of 03/12/15  CT RENAL STONE STUDY  Addendum 03/12/2015 12:29 PM ADDENDUM REPORT: 03/12/2015 12:27 ADDENDUM: The impression should read: Small bowel dilatation consistent with partial small bowel obstruction secondary to adhesions. No definitive mass lesion is noted. Electronically Signed By: Alcide Clever M.D. On: 03/12/2015 12:27  Narrative CLINICAL DATA:  Shortness of breath and abdominal pain  EXAM: CT CHEST, ABDOMEN AND PELVIS WITHOUT CONTRAST  TECHNIQUE: Multidetector CT imaging of the chest, abdomen and pelvis was performed following the standard protocol without IV contrast.  COMPARISON:  05/10/2006  FINDINGS: CT CHEST  Lungs are well aerated bilaterally. No focal confluent infiltrate or sizable effusion is noted. A ground-glass nodule is noted in the left lower lobe best seen on image number 27  of series 6. This is stable from the prior exam. No other focal nodules are seen.  The thoracic aorta show some calcifications without aneurysmal dilatation. No hilar or mediastinal adenopathy is noted. Mild fluid is noted in the distal esophagus which may be related to reflux. Small hiatal hernia is noted. No acute bony abnormality is seen.  CT ABDOMEN AND PELVIS  The liver, gallbladder, spleen, adrenal glands and pancreas are within normal limits. The kidneys are well visualized bilaterally without evidence of renal calculi or urinary tract obstructive changes. Diffuse small bowel dilatation is noted with a focal area of relatively smooth transition in the anterior abdomen best seen on image number 82 of series 2. There postsurgical changes in the anterior abdominal wall on these changes are likely related to adhesions as no definitive mass lesion is seen. More distal small bowel is within normal limits. Mild diverticular change of the colon as well as changes of prior colectomy are seen. No diverticulitis is noted.  The appendix is not well seen although no inflammatory changes are noted. The bladder is well distended. The uterus has been surgically removed. Degenerative changes of lumbar spine are noted.  IMPRESSION: Ground-glass nodule in the left lower lobe stable from the prior exam and likely benign in etiology.  Small bowel dilatation likely related to adhesions. No definitive mass lesion is seen.  No other acute abnormality is noted.  Electronically Signed: By: Alcide Clever M.D. On: 03/12/2015 12:17   Assessment & Plan:    1. Recurrent UTI -continue macrobid 50mg  qhs - Urinalysis, Routine w reflex microscopic - BLADDER SCAN AMB NON-IMAGING  2. Nephrolithiasis RTC 6 months with KUB.    No follow-ups on file.  , MD  [facility]

## 2020-05-26 DIAGNOSIS — M25562 Pain in left knee: Secondary | ICD-10-CM | POA: Diagnosis not present

## 2020-05-26 DIAGNOSIS — M25512 Pain in left shoulder: Secondary | ICD-10-CM | POA: Diagnosis not present

## 2020-05-27 ENCOUNTER — Other Ambulatory Visit: Payer: Self-pay | Admitting: Orthopedic Surgery

## 2020-05-27 ENCOUNTER — Other Ambulatory Visit (HOSPITAL_COMMUNITY): Payer: Self-pay | Admitting: Orthopedic Surgery

## 2020-05-27 DIAGNOSIS — M25562 Pain in left knee: Secondary | ICD-10-CM

## 2020-06-08 ENCOUNTER — Other Ambulatory Visit (HOSPITAL_COMMUNITY): Payer: Self-pay | Admitting: Orthopedic Surgery

## 2020-06-08 DIAGNOSIS — M25561 Pain in right knee: Secondary | ICD-10-CM

## 2020-06-09 ENCOUNTER — Ambulatory Visit (HOSPITAL_COMMUNITY)
Admission: RE | Admit: 2020-06-09 | Discharge: 2020-06-09 | Disposition: A | Discharge status: Home / Self Care | Payer: Medicare Other | Source: Ambulatory Visit | Attending: Orthopedic Surgery | Admitting: Orthopedic Surgery

## 2020-06-09 DIAGNOSIS — M25562 Pain in left knee: Secondary | ICD-10-CM | POA: Diagnosis not present

## 2020-06-09 DIAGNOSIS — M25561 Pain in right knee: Secondary | ICD-10-CM

## 2020-06-16 DIAGNOSIS — S83241A Other tear of medial meniscus, current injury, right knee, initial encounter: Secondary | ICD-10-CM | POA: Diagnosis not present

## 2020-06-16 DIAGNOSIS — M2342 Loose body in knee, left knee: Secondary | ICD-10-CM | POA: Diagnosis not present

## 2020-06-23 DIAGNOSIS — I10 Essential (primary) hypertension: Secondary | ICD-10-CM | POA: Diagnosis not present

## 2020-06-23 DIAGNOSIS — D649 Anemia, unspecified: Secondary | ICD-10-CM | POA: Diagnosis not present

## 2020-06-23 DIAGNOSIS — E039 Hypothyroidism, unspecified: Secondary | ICD-10-CM | POA: Diagnosis not present

## 2020-06-23 DIAGNOSIS — F339 Major depressive disorder, recurrent, unspecified: Secondary | ICD-10-CM | POA: Diagnosis not present

## 2020-07-09 ENCOUNTER — Other Ambulatory Visit: Payer: Self-pay

## 2020-07-09 NOTE — Progress Notes (Signed)
Patient states she is experiencing frequent urination and burning.  She is taking Macrodantin nightly.

## 2020-07-12 LAB — URINE CULTURE

## 2020-07-14 DIAGNOSIS — F3341 Major depressive disorder, recurrent, in partial remission: Secondary | ICD-10-CM | POA: Diagnosis not present

## 2020-07-17 DIAGNOSIS — F3341 Major depressive disorder, recurrent, in partial remission: Secondary | ICD-10-CM | POA: Diagnosis not present

## 2020-07-28 ENCOUNTER — Ambulatory Visit (INDEPENDENT_AMBULATORY_CARE_PROVIDER_SITE_OTHER): Payer: Medicare Other | Admitting: Internal Medicine

## 2020-07-31 DIAGNOSIS — F3341 Major depressive disorder, recurrent, in partial remission: Secondary | ICD-10-CM | POA: Diagnosis not present

## 2020-08-25 DIAGNOSIS — M47816 Spondylosis without myelopathy or radiculopathy, lumbar region: Secondary | ICD-10-CM | POA: Diagnosis not present

## 2020-08-25 DIAGNOSIS — L659 Nonscarring hair loss, unspecified: Secondary | ICD-10-CM | POA: Diagnosis not present

## 2020-08-25 DIAGNOSIS — I10 Essential (primary) hypertension: Secondary | ICD-10-CM | POA: Diagnosis not present

## 2020-08-25 DIAGNOSIS — R197 Diarrhea, unspecified: Secondary | ICD-10-CM | POA: Diagnosis not present

## 2020-08-25 DIAGNOSIS — E039 Hypothyroidism, unspecified: Secondary | ICD-10-CM | POA: Diagnosis not present

## 2020-08-27 DIAGNOSIS — R197 Diarrhea, unspecified: Secondary | ICD-10-CM | POA: Diagnosis not present

## 2020-09-04 DIAGNOSIS — R293 Abnormal posture: Secondary | ICD-10-CM | POA: Diagnosis not present

## 2020-09-04 DIAGNOSIS — M47816 Spondylosis without myelopathy or radiculopathy, lumbar region: Secondary | ICD-10-CM | POA: Diagnosis not present

## 2020-09-04 DIAGNOSIS — M6389 Disorders of muscle in diseases classified elsewhere, multiple sites: Secondary | ICD-10-CM | POA: Diagnosis not present

## 2020-09-04 DIAGNOSIS — K580 Irritable bowel syndrome with diarrhea: Secondary | ICD-10-CM | POA: Diagnosis not present

## 2020-09-07 DIAGNOSIS — F3341 Major depressive disorder, recurrent, in partial remission: Secondary | ICD-10-CM | POA: Diagnosis not present

## 2020-09-18 DIAGNOSIS — M47816 Spondylosis without myelopathy or radiculopathy, lumbar region: Secondary | ICD-10-CM | POA: Diagnosis not present

## 2020-09-18 DIAGNOSIS — R293 Abnormal posture: Secondary | ICD-10-CM | POA: Diagnosis not present

## 2020-09-18 DIAGNOSIS — I10 Essential (primary) hypertension: Secondary | ICD-10-CM | POA: Diagnosis not present

## 2020-09-18 DIAGNOSIS — K580 Irritable bowel syndrome with diarrhea: Secondary | ICD-10-CM | POA: Diagnosis not present

## 2020-09-18 DIAGNOSIS — E039 Hypothyroidism, unspecified: Secondary | ICD-10-CM | POA: Diagnosis not present

## 2020-09-18 DIAGNOSIS — F419 Anxiety disorder, unspecified: Secondary | ICD-10-CM | POA: Diagnosis not present

## 2020-09-18 DIAGNOSIS — F339 Major depressive disorder, recurrent, unspecified: Secondary | ICD-10-CM | POA: Diagnosis not present

## 2020-09-18 DIAGNOSIS — M6389 Disorders of muscle in diseases classified elsewhere, multiple sites: Secondary | ICD-10-CM | POA: Diagnosis not present

## 2020-09-22 ENCOUNTER — Ambulatory Visit (INDEPENDENT_AMBULATORY_CARE_PROVIDER_SITE_OTHER): Payer: Medicare Other | Admitting: Internal Medicine

## 2020-09-22 ENCOUNTER — Other Ambulatory Visit: Payer: Self-pay

## 2020-09-22 ENCOUNTER — Encounter (INDEPENDENT_AMBULATORY_CARE_PROVIDER_SITE_OTHER): Payer: Self-pay | Admitting: Internal Medicine

## 2020-09-22 VITALS — BP 130/81 | HR 125 | Temp 97.5°F | Ht 60.0 in | Wt 117.0 lb

## 2020-09-22 DIAGNOSIS — M6389 Disorders of muscle in diseases classified elsewhere, multiple sites: Secondary | ICD-10-CM | POA: Diagnosis not present

## 2020-09-22 DIAGNOSIS — K580 Irritable bowel syndrome with diarrhea: Secondary | ICD-10-CM

## 2020-09-22 DIAGNOSIS — K219 Gastro-esophageal reflux disease without esophagitis: Secondary | ICD-10-CM | POA: Diagnosis not present

## 2020-09-22 DIAGNOSIS — R293 Abnormal posture: Secondary | ICD-10-CM | POA: Diagnosis not present

## 2020-09-22 DIAGNOSIS — M47816 Spondylosis without myelopathy or radiculopathy, lumbar region: Secondary | ICD-10-CM | POA: Diagnosis not present

## 2020-09-22 DIAGNOSIS — R197 Diarrhea, unspecified: Secondary | ICD-10-CM | POA: Insufficient documentation

## 2020-09-22 NOTE — Progress Notes (Signed)
Presenting complaint;  Diarrhea.  Database and subjective:  Patient is [name] who has a history of GERD, several year history of IBS with diarrhea who lately had been constipated as well as remote history of sigmoid colon resection for benign/diverticular stricture whose last high risk colonoscopy was in February 2014 who presents for scheduled visit accompanied by her daughter-in-law Erica [name]. Her last visit was a virtual visit on 04/08/2020.  She now presents with 6-week history of diarrhea.  She is having 4-7 stools per day.  Yesterday she had 7 stools and today she already has had 2 bowel movements.  She has been experiencing accidents and is using pads.  She has several year history of IBS with diarrhea but over the last few years she had become constipated and was using low-dose polyethylene glycol with satisfactory results.  She was hospitalized for urosepsis back in February 2022 but did not have diarrhea until beginning of last week of May or first week of June 2022.  She remains on low-dose Macrodantin for UTI prophylaxis.  She did take Imodium on September 14, 2020 and did not have a bowel movement for 2-1/2 days and had to use an enema.  She denies abdominal pain but complains of bloating all the time.  She denies melena rectal bleeding fever or nocturnal bowel movements. She was seen by Dr. Mannie Stabile last month and had GI pathogen panel and C. difficile stool testing and these studies were negative. Her daughter-in-law reports that she is now living in [place] and is not very happy and her depression has flared up. She does not have a good appetite.  However she is not having nausea or vomiting.  She feels heartburn is well controlled with pantoprazole.  She denies dysphagia.  She is also not happy about her meals because she says every meal is served with cheese.  Current Medications: Outpatient Encounter Medications as of 09/22/2020  Medication Sig    acetaminophen (TYLENOL) 650 MG CR tablet Take 1,300 mg by mouth daily.   ARIPiprazole (ABILIFY) 5 MG tablet Take 5 mg by mouth daily.   L-Methylfolate-Algae (L-METHYLFOLATE FORTE) 15-90.314 MG CAPS    levothyroxine (SYNTHROID) 88 MCG tablet Take 88 mcg by mouth daily before breakfast.   LORazepam (ATIVAN) 0.5 MG tablet Take 0.5 mg by mouth at bedtime.   losartan (COZAAR) 50 MG tablet Take 75 mg by mouth See admin instructions. TAKE 75 IN MORNING AND 25 EVERY EVENING   metoprolol succinate (TOPROL-XL) 25 MG 24 hr tablet Take 25 mg by mouth daily.   Multiple Vitamins-Minerals (MULTIVITAMIN WITH MINERALS) tablet Take 1 tablet by mouth daily.   nitrofurantoin (MACRODANTIN) 50 MG capsule Take 1 capsule (50 mg total) by mouth at bedtime.   OVER THE COUNTER MEDICATION B12 1,000 mcg daily   OVER THE COUNTER MEDICATION Vit D once per week.   OVER THE COUNTER MEDICATION B complex with C once daily.   pantoprazole (PROTONIX) 40 MG tablet Take 1 tablet (40 mg total) by mouth daily before breakfast.   phenelzine (NARDIL) 15 MG tablet Take 15 mg by mouth 2 (two) times daily.   Probiotic, Lactobacillus, CAPS Take 1 capsule by mouth daily.   polyethylene glycol powder (GLYCOLAX/MIRALAX) powder 17 gram po qd as directed. (Patient not taking: Reported on 09/22/2020)   [DISCONTINUED] ondansetron (ZOFRAN) 4 MG tablet Take 1 tablet (4 mg total) by mouth every 8 (eight) hours as needed for nausea or vomiting.   [DISCONTINUED] polyvinyl alcohol (LIQUIFILM TEARS) 1.4 %  ophthalmic solution Place one drop into both eyes 4 (four) times a day as needed for up to 30 days.   No facility-administered encounter medications on file as of 09/22/2020.   Past Medical History:  Diagnosis Date   Anxiety    Arthritis    Bipolar disorder ([place])    Depression    Hyperlipidemia    Hypothyroidism    Hypothyroidism    Irritable bowel syndrome    RA (rheumatoid arthritis) ([place])    Past Surgical History:  Procedure Laterality Date    ABDOMINAL HYSTERECTOMY     Bone Spurs  08/07/13   Right Shoulder   COLONOSCOPY     COLONOSCOPY N/A 05/08/2012   Procedure: COLONOSCOPY;  Surgeon: Rogene Houston, MD;  [facility];  Service: Endoscopy;  Laterality: N/A;  730   COLOSTOMY     ESOPHAGOGASTRODUODENOSCOPY N/A 11/04/2013   Procedure: ESOPHAGOGASTRODUODENOSCOPY (EGD);  Surgeon: Rogene Houston, MD;  [facility];  Service: Endoscopy;  Laterality: N/A;  730   EYE SURGERY  cataract x 2   MALONEY DILATION N/A 11/04/2013   Procedure: MALONEY DILATION;  Surgeon: Rogene Houston, MD;  [facility];  Service: Endoscopy;  Laterality: N/A;   UPPER GASTROINTESTINAL ENDOSCOPY      Objective: Blood pressure 130/81, pulse (!) 125, temperature (!) 97.5 F (36.4 C), temperature source Oral, height 5' (1.524 m), weight 117 lb (53.1 kg). Patient is alert and in no acute distress. She is wearing a mask. She is using walker to ambulate. Conjunctiva is pink. Sclera is nonicteric Oropharyngeal mucosa is normal. No neck masses or thyromegaly noted. Cardiac exam with regular rhythm normal S1 and S2. No murmur or gallop noted. Lungs are clear to auscultation. Abdomen is full.  Bowel sounds are normal.  Percussion note is normal.  On palpation abdomen is soft and nontender with organomegaly or masses. No LE edema or clubbing noted. She has atrophy to small muscles of both hands with ulnar deviation and she has slight resting tremor to left hand  Labs/studies Results:   CBC Latest Ref Rng & Units 05/06/2020 05/03/2020 05/02/2020  WBC 4.0 - 10.5 K/uL 8.7 8.7 16.8(H)  Hemoglobin 12.0 - 15.0 g/dL 9.1(L) 9.6(L) 10.3(L)  Hematocrit 36.0 - 46.0 % 28.4(L) 30.8(L) 31.9(L)  Platelets 150 - 400 K/uL 173 146(L) 146(L)    CMP Latest Ref Rng & Units 05/03/2020 05/02/2020 05/01/2020  Glucose 70 - 99 mg/dL 105(H) 109(H) 128(H)  BUN 8 - 23 mg/dL 17 32(H) 35(H)  Creatinine 0.44 - 1.00 mg/dL 0.93 1.66(H) 2.23(H)  Sodium 135 - 145  mmol/L 136 132(L) 130(L)  Potassium 3.5 - 5.1 mmol/L 3.8 3.5 3.2(L)  Chloride 98 - 111 mmol/L 108 101 99  CO2 22 - 32 mmol/L 22 21(L) 21(L)  Calcium 8.9 - 10.3 mg/dL 8.5(L) 8.4(L) 8.3(L)  Total Protein 6.5 - 8.1 g/dL - - 6.5  Total Bilirubin 0.3 - 1.2 mg/dL - - 0.8  Alkaline Phos 38 - 126 U/L - - 77  AST 15 - 41 U/L - - 21  ALT 0 - 44 U/L - - 16    Hepatic Function Latest Ref Rng & Units 05/01/2020 12/26/2019 05/04/2017  Total Protein 6.5 - 8.1 g/dL 6.5 7.8 6.5  Albumin 3.5 - 5.0 g/dL 3.0(L) 4.3 3.4(L)  AST 15 - 41 U/L _0 ALT 0 - 44 U/L _1 Alk Phosphatase 38 - 126 U/L 77 69 63  Total Bilirubin 0.3 -  1.2 mg/dL 0.8 0.6 0.5     Abdominal pelvic CT images from February 2019 2022 study reviewed. Evidence of colonic anastomosis in the region of proximal sigmoid colon but no evidence of wall thickening or dilation upstream.  She had large hiatal hernia which was felt to be stable Urologic findings included mild bilateral hydronephrosis and proximal hydroureter without evidence of obstruction.  3 mm nonobstructing stone in left mid kidney, bilateral perinephric inflammatory fat stranding suggestive of acute pyelonephritis.  Assessment:  #1.  Nonbloody diarrhea of 6 weeks duration in patient with long history of IBS with diarrhea who had been in remission for a number of years.  GI pathogen panel and C. difficile stool testing was -3 weeks ago.  It is interesting to note that she went 2-1/2 days without a bowel movement on taking single dose of Imodium and needed fleets enema for relief.  I suspect diarrhea is due to relapse of her IBS given that her depression is not well controlled resulting from moving to a retirement community.  She certainly could have microscopic or collagenous colitis resulting in diarrhea which only would be diagnosed with colonoscopy and biopsy.  Symptoms are not suggestive of colonic stricture with spurious diarrhea.  She is definitely not interested in  endoscopic evaluation at this time.  Therefore it would be reasonable to treat her symptomatically and see how she does.  #2.  Chronic GERD.  Heartburn is well controlled with therapy.  #3.  Patient is high risk for colorectal carcinoma because of family history of CRC.  Her last colonoscopy was unremarkable in February 2014 and she decided not to have any more colonoscopies.  Plan:  Loperamide 1 mg by mouth daily before breakfast. If diarrhea persists increase dose to 2 mg every morning. Use Dulcolax suppository or fleets enema if no bowel movement experience for more than 24 hours. Patient will keep stool diary as to frequency and consistency of stools until next office visit in 1 month.

## 2020-09-22 NOTE — Patient Instructions (Signed)
Take Imodium/loperamide OTC 1 mg every morning before breakfast for 1 week and increase to 2 mg if necessary. Use Dulcolax suppository or Fleet enema if you go more than 24 hours without a bowel movement. Stool diary as to frequency and consistency of stools until office visit in 1 month.

## 2020-09-23 DIAGNOSIS — R293 Abnormal posture: Secondary | ICD-10-CM | POA: Diagnosis not present

## 2020-09-23 DIAGNOSIS — M6389 Disorders of muscle in diseases classified elsewhere, multiple sites: Secondary | ICD-10-CM | POA: Diagnosis not present

## 2020-09-23 DIAGNOSIS — K580 Irritable bowel syndrome with diarrhea: Secondary | ICD-10-CM | POA: Diagnosis not present

## 2020-09-23 DIAGNOSIS — M47816 Spondylosis without myelopathy or radiculopathy, lumbar region: Secondary | ICD-10-CM | POA: Diagnosis not present

## 2020-09-24 DIAGNOSIS — R293 Abnormal posture: Secondary | ICD-10-CM | POA: Diagnosis not present

## 2020-09-24 DIAGNOSIS — M6389 Disorders of muscle in diseases classified elsewhere, multiple sites: Secondary | ICD-10-CM | POA: Diagnosis not present

## 2020-09-24 DIAGNOSIS — K580 Irritable bowel syndrome with diarrhea: Secondary | ICD-10-CM | POA: Diagnosis not present

## 2020-09-24 DIAGNOSIS — M47816 Spondylosis without myelopathy or radiculopathy, lumbar region: Secondary | ICD-10-CM | POA: Diagnosis not present

## 2020-09-28 DIAGNOSIS — K580 Irritable bowel syndrome with diarrhea: Secondary | ICD-10-CM | POA: Diagnosis not present

## 2020-09-28 DIAGNOSIS — M6389 Disorders of muscle in diseases classified elsewhere, multiple sites: Secondary | ICD-10-CM | POA: Diagnosis not present

## 2020-09-28 DIAGNOSIS — M47816 Spondylosis without myelopathy or radiculopathy, lumbar region: Secondary | ICD-10-CM | POA: Diagnosis not present

## 2020-09-28 DIAGNOSIS — R293 Abnormal posture: Secondary | ICD-10-CM | POA: Diagnosis not present

## 2020-09-29 DIAGNOSIS — M6389 Disorders of muscle in diseases classified elsewhere, multiple sites: Secondary | ICD-10-CM | POA: Diagnosis not present

## 2020-09-29 DIAGNOSIS — M47816 Spondylosis without myelopathy or radiculopathy, lumbar region: Secondary | ICD-10-CM | POA: Diagnosis not present

## 2020-09-29 DIAGNOSIS — K580 Irritable bowel syndrome with diarrhea: Secondary | ICD-10-CM | POA: Diagnosis not present

## 2020-09-29 DIAGNOSIS — R293 Abnormal posture: Secondary | ICD-10-CM | POA: Diagnosis not present

## 2020-10-02 DIAGNOSIS — K580 Irritable bowel syndrome with diarrhea: Secondary | ICD-10-CM | POA: Diagnosis not present

## 2020-10-02 DIAGNOSIS — M47816 Spondylosis without myelopathy or radiculopathy, lumbar region: Secondary | ICD-10-CM | POA: Diagnosis not present

## 2020-10-02 DIAGNOSIS — R293 Abnormal posture: Secondary | ICD-10-CM | POA: Diagnosis not present

## 2020-10-02 DIAGNOSIS — M6389 Disorders of muscle in diseases classified elsewhere, multiple sites: Secondary | ICD-10-CM | POA: Diagnosis not present

## 2020-10-06 DIAGNOSIS — F3341 Major depressive disorder, recurrent, in partial remission: Secondary | ICD-10-CM | POA: Diagnosis not present

## 2020-10-06 DIAGNOSIS — R293 Abnormal posture: Secondary | ICD-10-CM | POA: Diagnosis not present

## 2020-10-06 DIAGNOSIS — M47816 Spondylosis without myelopathy or radiculopathy, lumbar region: Secondary | ICD-10-CM | POA: Diagnosis not present

## 2020-10-06 DIAGNOSIS — K580 Irritable bowel syndrome with diarrhea: Secondary | ICD-10-CM | POA: Diagnosis not present

## 2020-10-06 DIAGNOSIS — M6389 Disorders of muscle in diseases classified elsewhere, multiple sites: Secondary | ICD-10-CM | POA: Diagnosis not present

## 2020-10-08 DIAGNOSIS — I10 Essential (primary) hypertension: Secondary | ICD-10-CM | POA: Diagnosis not present

## 2020-10-08 DIAGNOSIS — K580 Irritable bowel syndrome with diarrhea: Secondary | ICD-10-CM | POA: Diagnosis not present

## 2020-10-08 DIAGNOSIS — R293 Abnormal posture: Secondary | ICD-10-CM | POA: Diagnosis not present

## 2020-10-08 DIAGNOSIS — E039 Hypothyroidism, unspecified: Secondary | ICD-10-CM | POA: Diagnosis not present

## 2020-10-08 DIAGNOSIS — F339 Major depressive disorder, recurrent, unspecified: Secondary | ICD-10-CM | POA: Diagnosis not present

## 2020-10-08 DIAGNOSIS — M47816 Spondylosis without myelopathy or radiculopathy, lumbar region: Secondary | ICD-10-CM | POA: Diagnosis not present

## 2020-10-08 DIAGNOSIS — M6389 Disorders of muscle in diseases classified elsewhere, multiple sites: Secondary | ICD-10-CM | POA: Diagnosis not present

## 2020-10-08 DIAGNOSIS — M720 Palmar fascial fibromatosis [Dupuytren]: Secondary | ICD-10-CM | POA: Diagnosis not present

## 2020-10-12 DIAGNOSIS — F3341 Major depressive disorder, recurrent, in partial remission: Secondary | ICD-10-CM | POA: Diagnosis not present

## 2020-10-20 DIAGNOSIS — M6389 Disorders of muscle in diseases classified elsewhere, multiple sites: Secondary | ICD-10-CM | POA: Diagnosis not present

## 2020-10-20 DIAGNOSIS — R293 Abnormal posture: Secondary | ICD-10-CM | POA: Diagnosis not present

## 2020-10-20 DIAGNOSIS — K580 Irritable bowel syndrome with diarrhea: Secondary | ICD-10-CM | POA: Diagnosis not present

## 2020-10-20 DIAGNOSIS — M47816 Spondylosis without myelopathy or radiculopathy, lumbar region: Secondary | ICD-10-CM | POA: Diagnosis not present

## 2020-10-22 ENCOUNTER — Ambulatory Visit (INDEPENDENT_AMBULATORY_CARE_PROVIDER_SITE_OTHER): Payer: Medicare Other | Admitting: Internal Medicine

## 2020-10-22 ENCOUNTER — Ambulatory Visit (INDEPENDENT_AMBULATORY_CARE_PROVIDER_SITE_OTHER): Payer: Medicare Other | Admitting: Gastroenterology

## 2020-10-23 DIAGNOSIS — M6389 Disorders of muscle in diseases classified elsewhere, multiple sites: Secondary | ICD-10-CM | POA: Diagnosis not present

## 2020-10-23 DIAGNOSIS — M47816 Spondylosis without myelopathy or radiculopathy, lumbar region: Secondary | ICD-10-CM | POA: Diagnosis not present

## 2020-10-23 DIAGNOSIS — K580 Irritable bowel syndrome with diarrhea: Secondary | ICD-10-CM | POA: Diagnosis not present

## 2020-10-23 DIAGNOSIS — R293 Abnormal posture: Secondary | ICD-10-CM | POA: Diagnosis not present

## 2020-10-27 DIAGNOSIS — R293 Abnormal posture: Secondary | ICD-10-CM | POA: Diagnosis not present

## 2020-10-27 DIAGNOSIS — M47816 Spondylosis without myelopathy or radiculopathy, lumbar region: Secondary | ICD-10-CM | POA: Diagnosis not present

## 2020-10-27 DIAGNOSIS — M6389 Disorders of muscle in diseases classified elsewhere, multiple sites: Secondary | ICD-10-CM | POA: Diagnosis not present

## 2020-10-27 DIAGNOSIS — K580 Irritable bowel syndrome with diarrhea: Secondary | ICD-10-CM | POA: Diagnosis not present

## 2020-10-29 DIAGNOSIS — M47816 Spondylosis without myelopathy or radiculopathy, lumbar region: Secondary | ICD-10-CM | POA: Diagnosis not present

## 2020-10-29 DIAGNOSIS — K580 Irritable bowel syndrome with diarrhea: Secondary | ICD-10-CM | POA: Diagnosis not present

## 2020-10-29 DIAGNOSIS — R293 Abnormal posture: Secondary | ICD-10-CM | POA: Diagnosis not present

## 2020-10-29 DIAGNOSIS — M6389 Disorders of muscle in diseases classified elsewhere, multiple sites: Secondary | ICD-10-CM | POA: Diagnosis not present

## 2020-11-03 DIAGNOSIS — M6389 Disorders of muscle in diseases classified elsewhere, multiple sites: Secondary | ICD-10-CM | POA: Diagnosis not present

## 2020-11-03 DIAGNOSIS — M47816 Spondylosis without myelopathy or radiculopathy, lumbar region: Secondary | ICD-10-CM | POA: Diagnosis not present

## 2020-11-03 DIAGNOSIS — R293 Abnormal posture: Secondary | ICD-10-CM | POA: Diagnosis not present

## 2020-11-03 DIAGNOSIS — K580 Irritable bowel syndrome with diarrhea: Secondary | ICD-10-CM | POA: Diagnosis not present

## 2020-11-05 DIAGNOSIS — F3341 Major depressive disorder, recurrent, in partial remission: Secondary | ICD-10-CM | POA: Diagnosis not present

## 2020-11-09 DIAGNOSIS — L570 Actinic keratosis: Secondary | ICD-10-CM | POA: Diagnosis not present

## 2020-11-09 DIAGNOSIS — Z85828 Personal history of other malignant neoplasm of skin: Secondary | ICD-10-CM | POA: Diagnosis not present

## 2020-11-09 DIAGNOSIS — D225 Melanocytic nevi of trunk: Secondary | ICD-10-CM | POA: Diagnosis not present

## 2020-11-09 DIAGNOSIS — L821 Other seborrheic keratosis: Secondary | ICD-10-CM | POA: Diagnosis not present

## 2020-11-25 ENCOUNTER — Ambulatory Visit: Payer: Medicare Other | Admitting: Urology

## 2020-11-27 DIAGNOSIS — F3341 Major depressive disorder, recurrent, in partial remission: Secondary | ICD-10-CM | POA: Diagnosis not present

## 2020-12-18 ENCOUNTER — Ambulatory Visit: Payer: Medicare Other | Admitting: Urology

## 2020-12-21 DIAGNOSIS — M13849 Other specified arthritis, unspecified hand: Secondary | ICD-10-CM | POA: Diagnosis not present

## 2020-12-21 DIAGNOSIS — M069 Rheumatoid arthritis, unspecified: Secondary | ICD-10-CM | POA: Diagnosis not present

## 2020-12-21 DIAGNOSIS — M79641 Pain in right hand: Secondary | ICD-10-CM | POA: Diagnosis not present

## 2020-12-21 DIAGNOSIS — M79642 Pain in left hand: Secondary | ICD-10-CM | POA: Diagnosis not present

## 2020-12-26 DIAGNOSIS — N390 Urinary tract infection, site not specified: Secondary | ICD-10-CM | POA: Diagnosis not present

## 2021-01-07 ENCOUNTER — Ambulatory Visit (HOSPITAL_COMMUNITY)
Admission: RE | Admit: 2021-01-07 | Discharge: 2021-01-07 | Disposition: A | Discharge status: Home / Self Care | Payer: Medicare Other | Source: Ambulatory Visit | Attending: Urology | Admitting: Urology

## 2021-01-07 ENCOUNTER — Other Ambulatory Visit: Payer: Self-pay

## 2021-01-07 DIAGNOSIS — Z87442 Personal history of urinary calculi: Secondary | ICD-10-CM | POA: Diagnosis not present

## 2021-01-07 DIAGNOSIS — M160 Bilateral primary osteoarthritis of hip: Secondary | ICD-10-CM | POA: Diagnosis not present

## 2021-01-07 DIAGNOSIS — N200 Calculus of kidney: Secondary | ICD-10-CM | POA: Diagnosis not present

## 2021-01-08 DIAGNOSIS — Z79899 Other long term (current) drug therapy: Secondary | ICD-10-CM | POA: Diagnosis not present

## 2021-01-08 DIAGNOSIS — Z23 Encounter for immunization: Secondary | ICD-10-CM | POA: Diagnosis not present

## 2021-01-08 DIAGNOSIS — E039 Hypothyroidism, unspecified: Secondary | ICD-10-CM | POA: Diagnosis not present

## 2021-01-08 DIAGNOSIS — I10 Essential (primary) hypertension: Secondary | ICD-10-CM | POA: Diagnosis not present

## 2021-01-08 DIAGNOSIS — D539 Nutritional anemia, unspecified: Secondary | ICD-10-CM | POA: Diagnosis not present

## 2021-01-08 DIAGNOSIS — Z87440 Personal history of urinary (tract) infections: Secondary | ICD-10-CM | POA: Diagnosis not present

## 2021-01-11 DIAGNOSIS — F3341 Major depressive disorder, recurrent, in partial remission: Secondary | ICD-10-CM | POA: Diagnosis not present

## 2021-01-13 ENCOUNTER — Encounter: Payer: Self-pay | Admitting: Urology

## 2021-01-13 ENCOUNTER — Other Ambulatory Visit: Payer: Self-pay

## 2021-01-13 ENCOUNTER — Ambulatory Visit: Payer: Medicare Other | Admitting: Urology

## 2021-01-13 VITALS — BP 125/82 | HR 98

## 2021-01-13 DIAGNOSIS — N390 Urinary tract infection, site not specified: Secondary | ICD-10-CM

## 2021-01-13 DIAGNOSIS — N200 Calculus of kidney: Secondary | ICD-10-CM | POA: Diagnosis not present

## 2021-01-13 NOTE — Progress Notes (Signed)
Urological Symptom Review  Patient is experiencing the following symptoms: Frequent urination Hard to postpone urination Leakage of urine   Review of Systems  Gastrointestinal (upper)  : Negative for upper GI symptoms  Gastrointestinal (lower) : Negative for lower GI symptoms  Constitutional : Weight loss  Skin: Negative for skin symptoms  Eyes: Negative for eye symptoms  Ear/Nose/Throat : Negative for Ear/Nose/Throat symptoms  Hematologic/Lymphatic: Negative for Hematologic/Lymphatic symptoms  Cardiovascular : Negative for cardiovascular symptoms  Respiratory : Negative for respiratory symptoms  Endocrine: Negative for endocrine symptoms  Musculoskeletal: Back pain Joint pain  Neurological: Negative for neurological symptoms  Psychologic: Depression Anxiety

## 2021-01-13 NOTE — Progress Notes (Signed)
01/13/2021 3:28 PM   Erica [name] March 30, 1932 [id number]  Referring provider: Aliene Beams, MD [address] [place],  [place] [address]  Followup recurrent UTI and nephrolithiasis   HPI: Erica [name] is a 79LT here for followup for recurrent UTI and Nephrolithiasis. She has had 1 UTI since last visit treated with ceftin. She is on macrobid 50mg  qhs. She denies any flank pain. KUB from 10/27 shows no renal calculi. NO other complaints.    PMH: Past Medical History:  Diagnosis Date   Anxiety    Arthritis    Bipolar disorder (HCC)    Depression    Hyperlipidemia    Hypothyroidism    Hypothyroidism    Irritable bowel syndrome    RA (rheumatoid arthritis) (HCC)     Surgical History: Past Surgical History:  Procedure Laterality Date   ABDOMINAL HYSTERECTOMY     Bone Spurs  08/07/13   Right Shoulder   COLONOSCOPY     COLONOSCOPY N/A 05/08/2012   Procedure: COLONOSCOPY;  Surgeon: 05/10/2012, MD;  [facility]: [facility];  Service: Endoscopy;  Laterality: N/A;  730   COLOSTOMY     ESOPHAGOGASTRODUODENOSCOPY N/A 11/04/2013   Procedure: ESOPHAGOGASTRODUODENOSCOPY (EGD);  Surgeon: 11/06/2013, MD;  Location: [facility];  Service: Endoscopy;  Laterality: N/A;  730   EYE SURGERY  cataract x 2   MALONEY DILATION N/A 11/04/2013   Procedure: MALONEY DILATION;  Surgeon: 11/06/2013, MD;  Location: [facility];  Service: Endoscopy;  Laterality: N/A;   UPPER GASTROINTESTINAL ENDOSCOPY      Home Medications:  Allergies as of 01/13/2021       Reactions   Penicillins Itching, Rash   Hydrocodone Other (See Comments)   MAO-I ; cannot take medications.    Morphine And Related    Unable to take due to MAO-I   Morphine Sulfate Other (See Comments)   Sulfamethoxazole-trimethoprim Nausea Only, Other (See Comments)   And diarrhea. And diarrhea.        Medication List        Accurate as of January 13, 2021  3:28 PM. If you have any questions, ask your nurse or  doctor.          acetaminophen 650 MG CR tablet Commonly known as: TYLENOL Take 1,300 mg by mouth daily.   ARIPiprazole 5 MG tablet Commonly known as: ABILIFY Take 5 mg by mouth daily.   L-Methylfolate Forte 15-90.314 MG Caps Take 1 tablet by mouth daily.   levothyroxine 88 MCG tablet Commonly known as: SYNTHROID Take 88 mcg by mouth daily before breakfast.   LORazepam 0.5 MG tablet Commonly known as: ATIVAN Take 0.25 mg by mouth 2 (two) times daily.   losartan 50 MG tablet Commonly known as: COZAAR Take 75 mg by mouth See admin instructions. TAKE 75 IN MORNING AND 25 EVERY EVENING   metoprolol succinate 25 MG 24 hr tablet Commonly known as: TOPROL-XL Take 25 mg by mouth daily.   multivitamin with minerals tablet Take 1 tablet by mouth daily.   nitrofurantoin 50 MG capsule Commonly known as: MACRODANTIN Take 1 capsule (50 mg total) by mouth at bedtime.   OVER THE COUNTER MEDICATION B12 1,000 mcg daily   OVER THE COUNTER MEDICATION Vit D once per week.   OVER THE COUNTER MEDICATION B complex with C once daily.   pantoprazole 40 MG tablet Commonly known as: PROTONIX Take 1 tablet (40 mg total) by mouth daily before breakfast.  phenelzine 15 MG tablet Commonly known as: NARDIL Take 15 mg by mouth 2 (two) times daily.   Probiotic (Lactobacillus) Caps Take 1 capsule by mouth daily.        Allergies:  Allergies  Allergen Reactions   Penicillins Itching and Rash   Hydrocodone Other (See Comments)    MAO-I ; cannot take medications.    Morphine And Related     Unable to take due to MAO-I   Morphine Sulfate Other (See Comments)   Sulfamethoxazole-Trimethoprim Nausea Only and Other (See Comments)    And diarrhea. And diarrhea.     Family History: Family History  Problem Relation Age of Onset   Colon cancer Mother    Bipolar disorder Mother    Anxiety disorder Mother    Atrial fibrillation Son    Depression Maternal Grandmother     Social  History:  reports that she quit smoking about 39 years ago. Her smoking use included cigarettes. She has a 40.50 pack-year smoking history. She quit smokeless tobacco use about 40 years ago. She reports current alcohol use of about 1.0 standard drink per week. She reports that she does not use drugs.  ROS: All other review of systems were reviewed and are negative except what is noted above in HPI  Physical Exam: BP 125/82   Pulse 98   Constitutional:  Alert and oriented, No acute distress. HEENT: [place] AT, moist mucus membranes.  Trachea midline, no masses. Cardiovascular: No clubbing, cyanosis, or edema. Respiratory: Normal respiratory effort, no increased work of breathing. GI: Abdomen is soft, nontender, nondistended, no abdominal masses GU: No CVA tenderness.  Lymph: No cervical or inguinal lymphadenopathy. Skin: No rashes, bruises or suspicious lesions. Neurologic: Grossly intact, no focal deficits, moving all 4 extremities. Psychiatric: Normal mood and affect.  Laboratory Data: Lab Results  Component Value Date   WBC 8.7 05/06/2020   HGB 9.1 (L) 05/06/2020   HCT 28.4 (L) 05/06/2020   MCV 100.4 (H) 05/06/2020   PLT 173 05/06/2020    Lab Results  Component Value Date   CREATININE 0.93 05/03/2020    No results found for: PSA  No results found for: TESTOSTERONE  No results found for: HGBA1C  Urinalysis    Component Value Date/Time   COLORURINE STRAW (A) 05/02/2020 0842   APPEARANCEUR Clear 05/25/2020 1146   LABSPEC 1.003 (L) 05/02/2020 0842   PHURINE 6.0 05/02/2020 0842   GLUCOSEU Negative 05/25/2020 1146   HGBUR MODERATE (A) 05/02/2020 0842   BILIRUBINUR Negative 05/25/2020 1146   KETONESUR NEGATIVE 05/02/2020 0842   PROTEINUR Negative 05/25/2020 1146   PROTEINUR NEGATIVE 05/02/2020 0842   NITRITE Negative 05/25/2020 1146   NITRITE NEGATIVE 05/02/2020 0842   LEUKOCYTESUR Trace (A) 05/25/2020 1146   LEUKOCYTESUR SMALL (A) 05/02/2020 0842    Lab Results   Component Value Date   LABMICR See below: 05/25/2020   WBCUA 6-10 (A) 05/25/2020   LABEPIT >10 (A) 05/25/2020   BACTERIA Few 05/25/2020    Pertinent Imaging: KUB 10/27: Images reviewed and discussed with the patient  Results for orders placed during the hospital encounter of 01/07/21  Abdomen 1 view (KUB)  Narrative CLINICAL DATA:  History of recurrent UTIs.  Nephrolithiasis.  EXAM: ABDOMEN - 1 VIEW  COMPARISON:  CT 05/02/2020  FINDINGS: Bowel gas pattern is nonobstructive. No free peritoneal air. No calcifications over the kidneys or course of the ureters. Several surgical clips over the lower abdomen and pelvis. Moderate curvature of the lumbar spine convex left. Mild degenerate  change of the hips.  IMPRESSION: Nonobstructive bowel gas pattern.   Electronically Signed By: Elberta Fortis M.D. On: 01/09/2021 08:38  No results found for this or any previous visit.  No results found for this or any previous visit.  No results found for this or any previous visit.  No results found for this or any previous visit.  No results found for this or any previous visit.  No results found for this or any previous visit.  Results for orders placed during the hospital encounter of 03/12/15  CT RENAL STONE STUDY  Addendum 03/12/2015 12:29 PM ADDENDUM REPORT: 03/12/2015 12:27 ADDENDUM: The impression should read: Small bowel dilatation consistent with partial small bowel obstruction secondary to adhesions. No definitive mass lesion is noted. Electronically Signed By: Alcide Clever M.D. On: 03/12/2015 12:27  Narrative CLINICAL DATA:  Shortness of breath and abdominal pain  EXAM: CT CHEST, ABDOMEN AND PELVIS WITHOUT CONTRAST  TECHNIQUE: Multidetector CT imaging of the chest, abdomen and pelvis was performed following the standard protocol without IV contrast.  COMPARISON:  05/10/2006  FINDINGS: CT CHEST  Lungs are well aerated bilaterally. No focal confluent  infiltrate or sizable effusion is noted. A ground-glass nodule is noted in the left lower lobe best seen on image number 27 of series 6. This is stable from the prior exam. No other focal nodules are seen.  The thoracic aorta show some calcifications without aneurysmal dilatation. No hilar or mediastinal adenopathy is noted. Mild fluid is noted in the distal esophagus which may be related to reflux. Small hiatal hernia is noted. No acute bony abnormality is seen.  CT ABDOMEN AND PELVIS  The liver, gallbladder, spleen, adrenal glands and pancreas are within normal limits. The kidneys are well visualized bilaterally without evidence of renal calculi or urinary tract obstructive changes. Diffuse small bowel dilatation is noted with a focal area of relatively smooth transition in the anterior abdomen best seen on image number 82 of series 2. There postsurgical changes in the anterior abdominal wall on these changes are likely related to adhesions as no definitive mass lesion is seen. More distal small bowel is within normal limits. Mild diverticular change of the colon as well as changes of prior colectomy are seen. No diverticulitis is noted.  The appendix is not well seen although no inflammatory changes are noted. The bladder is well distended. The uterus has been surgically removed. Degenerative changes of lumbar spine are noted.  IMPRESSION: Ground-glass nodule in the left lower lobe stable from the prior exam and likely benign in etiology.  Small bowel dilatation likely related to adhesions. No definitive mass lesion is seen.  No other acute abnormality is noted.  Electronically Signed: By: Alcide Clever M.D. On: 03/12/2015 12:17   Assessment & Plan:    1. Nephrolithiasis -RTc 6 months with renal US  2. Recurrent UTI -continue macrobid 50mg  qhs   No follow-ups on file.  , MD  [facility] [place]

## 2021-01-13 NOTE — Patient Instructions (Signed)
Dietary Guidelines to Help Prevent Kidney Stones Kidney stones are deposits of minerals and salts that form inside your kidneys. Your risk of developing kidney stones may be greater depending on your diet, your lifestyle, the medicines you take, and whether you have certain medical conditions. Most people can lower their chances of developing kidney stones by following the instructions below. Your dietitian may give you more specific instructions depending on your overall health and the type of kidney stones you tend to develop. What are tips for following this plan? Reading food labels  Choose foods with "no salt added" or "low-salt" labels. Limit your salt (sodium) intake to less than 1,500 mg a day. Choose foods with calcium for each meal and snack. Try to eat about 300 mg of calcium at each meal. Foods that contain 200-500 mg of calcium a serving include: 8 oz (237 mL) of milk, calcium-fortifiednon-dairy milk, and calcium-fortifiedfruit juice. Calcium-fortified means that calcium has been added to these drinks. 8 oz (237 mL) of kefir, yogurt, and soy yogurt. 4 oz (114 g) of tofu. 1 oz (28 g) of cheese. 1 cup (150 g) of dried figs. 1 cup (91 g) of cooked broccoli. One 3 oz (85 g) can of sardines or mackerel. Most people need 1,000-1,500 mg of calcium a day. Talk to your dietitian about how much calcium is recommended for you. Shopping Buy plenty of fresh fruits and vegetables. Most people do not need to avoid fruits and vegetables, even if these foods contain nutrients that may contribute to kidney stones. When shopping for convenience foods, choose: Whole pieces of fruit. Pre-made salads with dressing on the side. Low-fat fruit and yogurt smoothies. Avoid buying frozen meals or prepared deli foods. These can be high in sodium. Look for foods with live cultures, such as yogurt and kefir. Choose high-fiber grains, such as whole-wheat breads, oat bran, and wheat cereals. Cooking Do not add  salt to food when cooking. Place a salt shaker on the table and allow each person to add his or her own salt to taste. Use vegetable protein, such as beans, textured vegetable protein (TVP), or tofu, instead of meat in pasta, casseroles, and soups. Meal planning Eat less salt, if told by your dietitian. To do this: Avoid eating processed or pre-made food. Avoid eating fast food. Eat less animal protein, including cheese, meat, poultry, or fish, if told by your dietitian. To do this: Limit the number of times you have meat, poultry, fish, or cheese each week. Eat a diet free of meat at least 2 days a week. Eat only one serving each day of meat, poultry, fish, or seafood. When you prepare animal protein, cut pieces into small portion sizes. For most meat and fish, one serving is about the size of the palm of your hand. Eat at least five servings of fresh fruits and vegetables each day. To do this: Keep fruits and vegetables on hand for snacks. Eat one piece of fruit or a handful of berries with breakfast. Have a salad and fruit at lunch. Have two kinds of vegetables at dinner. Limit foods that are high in a substance called oxalate. These include: Spinach (cooked), rhubarb, beets, sweet potatoes, and Swiss chard. Peanuts. Potato chips, french fries, and baked potatoes with skin on. Nuts and nut products. Chocolate. If you regularly take a diuretic medicine, make sure to eat at least 1 or 2 servings of fruits or vegetables that are high in potassium each day. These include: Avocado. Banana. Orange, prune,   carrot, or tomato juice. Baked potato. Cabbage. Beans and split peas. Lifestyle  Drink enough fluid to keep your urine pale yellow. This is the most important thing you can do. Spread your fluid intake throughout the day. If you drink alcohol: Limit how much you use to: 0-1 drink a day for women who are not pregnant. 0-2 drinks a day for men. Be aware of how much alcohol is in your  drink. In the [place] one drink equals one 12 oz bottle of beer (355 mL), one 5 oz glass of wine (148 mL), or one 1 oz glass of hard liquor (44 mL). Lose weight if told by your health care provider. Work with your dietitian to find an eating plan and weight loss strategies that work best for you. General information Talk to your health care provider and dietitian about taking daily supplements. You may be told the following depending on your health and the cause of your kidney stones: Not to take supplements with vitamin C. To take a calcium supplement. To take a daily probiotic supplement. To take other supplements such as magnesium, fish oil, or vitamin B6. Take over-the-counter and prescription medicines only as told by your health care provider. These include supplements. What foods should I limit? Limit your intake of the following foods, or eat them as told by your dietitian. Vegetables Spinach. Rhubarb. Beets. Canned vegetables. Pickles. Olives. Baked potatoes with skin. Grains Wheat bran. Baked goods. Salted crackers. Cereals high in sugar. Meats and other proteins Nuts. Nut butters. Large portions of meat, poultry, or fish. Salted, precooked, or cured meats, such as sausages, meat loaves, and hot dogs. Dairy Cheese. Beverages Regular soft drinks. Regular vegetable juice. Seasonings and condiments Seasoning blends with salt. Salad dressings. Soy sauce. Ketchup. Barbecue sauce. Other foods Canned soups. Canned pasta sauce. Casseroles. Pizza. Lasagna. Frozen meals. Potato chips. French fries. The items listed above may not be a complete list of foods and beverages you should limit. Contact a dietitian for more information. What foods should I avoid? Talk to your dietitian about specific foods you should avoid based on the type of kidney stones you have and your overall health. Fruits Grapefruit. The item listed above may not be a complete list of foods and beverages you should  avoid. Contact a dietitian for more information. Summary Kidney stones are deposits of minerals and salts that form inside your kidneys. You can lower your risk of kidney stones by making changes to your diet. The most important thing you can do is drink enough fluid. Drink enough fluid to keep your urine pale yellow. Talk to your dietitian about how much calcium you should have each day, and eat less salt and animal protein as told by your dietitian. This information is not intended to replace advice given to you by your health care provider. Make sure you discuss any questions you have with your health care provider. Document Revised: 02/21/2019 Document Reviewed: 02/21/2019 Elsevier Patient Education  2022 [employer]  

## 2021-01-18 ENCOUNTER — Telehealth: Payer: Self-pay

## 2021-01-18 NOTE — Telephone Encounter (Signed)
Patient notified of results.

## 2021-01-18 NOTE — Telephone Encounter (Signed)
-----   Message from Malen Gauze, MD sent at 01/16/2021  6:16 AM EDT ----- normal ----- Message ----- From: Ferdinand Lango, RN Sent: 01/11/2021   8:21 AM EDT To: Malen Gauze, MD  Please review

## 2021-03-16 DIAGNOSIS — F3341 Major depressive disorder, recurrent, in partial remission: Secondary | ICD-10-CM | POA: Diagnosis not present

## 2021-03-23 DIAGNOSIS — M0589 Other rheumatoid arthritis with rheumatoid factor of multiple sites: Secondary | ICD-10-CM | POA: Diagnosis not present

## 2021-03-23 DIAGNOSIS — M720 Palmar fascial fibromatosis [Dupuytren]: Secondary | ICD-10-CM | POA: Diagnosis not present

## 2021-03-23 DIAGNOSIS — M65341 Trigger finger, right ring finger: Secondary | ICD-10-CM | POA: Diagnosis not present

## 2021-03-23 DIAGNOSIS — Z79899 Other long term (current) drug therapy: Secondary | ICD-10-CM | POA: Diagnosis not present

## 2021-04-16 DIAGNOSIS — Z79899 Other long term (current) drug therapy: Secondary | ICD-10-CM | POA: Diagnosis not present

## 2021-04-16 DIAGNOSIS — E039 Hypothyroidism, unspecified: Secondary | ICD-10-CM | POA: Diagnosis not present

## 2021-04-16 DIAGNOSIS — F3341 Major depressive disorder, recurrent, in partial remission: Secondary | ICD-10-CM | POA: Diagnosis not present

## 2021-04-16 DIAGNOSIS — K5904 Chronic idiopathic constipation: Secondary | ICD-10-CM | POA: Diagnosis not present

## 2021-04-16 DIAGNOSIS — I10 Essential (primary) hypertension: Secondary | ICD-10-CM | POA: Diagnosis not present

## 2021-05-06 DIAGNOSIS — N3000 Acute cystitis without hematuria: Secondary | ICD-10-CM | POA: Diagnosis not present

## 2021-05-06 DIAGNOSIS — R300 Dysuria: Secondary | ICD-10-CM | POA: Diagnosis not present

## 2021-05-10 DIAGNOSIS — H5213 Myopia, bilateral: Secondary | ICD-10-CM | POA: Diagnosis not present

## 2021-05-19 DIAGNOSIS — M6780 Other specified disorders of synovium and tendon, unspecified site: Secondary | ICD-10-CM | POA: Diagnosis not present

## 2021-05-19 DIAGNOSIS — M79642 Pain in left hand: Secondary | ICD-10-CM | POA: Diagnosis not present

## 2021-05-19 DIAGNOSIS — M13841 Other specified arthritis, right hand: Secondary | ICD-10-CM | POA: Diagnosis not present

## 2021-05-19 DIAGNOSIS — M13842 Other specified arthritis, left hand: Secondary | ICD-10-CM | POA: Diagnosis not present

## 2021-05-20 DIAGNOSIS — R944 Abnormal results of kidney function studies: Secondary | ICD-10-CM | POA: Diagnosis not present

## 2021-06-02 DIAGNOSIS — M79642 Pain in left hand: Secondary | ICD-10-CM | POA: Diagnosis not present

## 2021-06-14 DIAGNOSIS — F333 Major depressive disorder, recurrent, severe with psychotic symptoms: Secondary | ICD-10-CM | POA: Diagnosis not present

## 2021-06-16 DIAGNOSIS — R300 Dysuria: Secondary | ICD-10-CM | POA: Diagnosis not present

## 2021-06-16 DIAGNOSIS — N390 Urinary tract infection, site not specified: Secondary | ICD-10-CM | POA: Diagnosis not present

## 2021-06-16 DIAGNOSIS — R3915 Urgency of urination: Secondary | ICD-10-CM | POA: Diagnosis not present

## 2021-06-16 DIAGNOSIS — R350 Frequency of micturition: Secondary | ICD-10-CM | POA: Diagnosis not present

## 2021-06-16 DIAGNOSIS — R399 Unspecified symptoms and signs involving the genitourinary system: Secondary | ICD-10-CM | POA: Diagnosis not present

## 2021-07-19 ENCOUNTER — Ambulatory Visit: Payer: Medicare Other | Admitting: Urology

## 2021-08-04 ENCOUNTER — Ambulatory Visit: Payer: Medicare Other | Admitting: Urology

## 2021-08-04 DIAGNOSIS — F333 Major depressive disorder, recurrent, severe with psychotic symptoms: Secondary | ICD-10-CM | POA: Diagnosis not present

## 2021-08-25 DIAGNOSIS — F333 Major depressive disorder, recurrent, severe with psychotic symptoms: Secondary | ICD-10-CM | POA: Diagnosis not present

## 2021-09-17 ENCOUNTER — Ambulatory Visit (HOSPITAL_COMMUNITY)
Admission: RE | Admit: 2021-09-17 | Discharge: 2021-09-17 | Disposition: A | Discharge status: Home / Self Care | Payer: Medicare Other | Source: Ambulatory Visit | Attending: Urology | Admitting: Urology

## 2021-09-17 DIAGNOSIS — N200 Calculus of kidney: Secondary | ICD-10-CM | POA: Diagnosis not present

## 2021-09-17 DIAGNOSIS — Z0389 Encounter for observation for other suspected diseases and conditions ruled out: Secondary | ICD-10-CM | POA: Diagnosis not present

## 2021-10-01 ENCOUNTER — Ambulatory Visit (INDEPENDENT_AMBULATORY_CARE_PROVIDER_SITE_OTHER): Payer: Medicare Other | Admitting: Urology

## 2021-10-01 ENCOUNTER — Encounter: Payer: Self-pay | Admitting: Urology

## 2021-10-01 VITALS — BP 137/83 | HR 101

## 2021-10-01 DIAGNOSIS — N200 Calculus of kidney: Secondary | ICD-10-CM | POA: Diagnosis not present

## 2021-10-01 DIAGNOSIS — Z87442 Personal history of urinary calculi: Secondary | ICD-10-CM | POA: Diagnosis not present

## 2021-10-01 DIAGNOSIS — N390 Urinary tract infection, site not specified: Secondary | ICD-10-CM | POA: Diagnosis not present

## 2021-10-01 LAB — URINALYSIS, ROUTINE W REFLEX MICROSCOPIC
Bilirubin, UA: NEGATIVE
Glucose, UA: NEGATIVE
Ketones, UA: NEGATIVE
Nitrite, UA: NEGATIVE
Protein,UA: NEGATIVE
RBC, UA: NEGATIVE
Specific Gravity, UA: 1.01 (ref 1.005–1.030)
Urobilinogen, Ur: 0.2 mg/dL (ref 0.2–1.0)
pH, UA: 5.5 (ref 5.0–7.5)

## 2021-10-01 LAB — MICROSCOPIC EXAMINATION
RBC, Urine: NONE SEEN /hpf (ref 0–2)
Renal Epithel, UA: NONE SEEN /hpf

## 2021-10-01 MED ORDER — NITROFURANTOIN MACROCRYSTAL 50 MG PO CAPS: 50.0000 mg | ORAL_CAPSULE | Freq: Every day | ORAL | 3 refills | Status: AC

## 2021-10-01 MED ORDER — NITROFURANTOIN MACROCRYSTAL 50 MG PO CAPS: 50.0000 mg | ORAL_CAPSULE | Freq: Every day | ORAL | 3 refills | Status: DC

## 2021-10-01 NOTE — Progress Notes (Signed)
10/01/2021 12:28 PM   Erica [name] 1932-06-04 [id number]  Referring provider: Aliene Beams, MD [address] [place],  [place] [address]  Followup recurrent UTI and nephrolithiasis   HPI: Erica [name] is a 29OZ here for followup for recurrent UTI and nephrolithiasis. No stone events since last visit. Renal US 09/17/2021 shows left lower pole calculus which is stable. She has had 1 UTI since last visit. She is on macrodantin  qhs. She has daytime urinary frequency every hour. She is not bothered by the urgency or frequency.    PMH: Past Medical History:  Diagnosis Date   Anxiety    Arthritis    Bipolar disorder (HCC)    Depression    Hyperlipidemia    Hypothyroidism    Hypothyroidism    Irritable bowel syndrome    RA (rheumatoid arthritis) (HCC)     Surgical History: Past Surgical History:  Procedure Laterality Date   ABDOMINAL HYSTERECTOMY     Bone Spurs  08/07/13   Right Shoulder   COLONOSCOPY     COLONOSCOPY N/A 05/08/2012   Procedure: COLONOSCOPY;  Surgeon: Malissa Hippo, MD;  Location: [facility];  Service: Endoscopy;  Laterality: N/A;  730   COLOSTOMY     ESOPHAGOGASTRODUODENOSCOPY N/A 11/04/2013   Procedure: ESOPHAGOGASTRODUODENOSCOPY (EGD);  Surgeon: Malissa Hippo, MD;  Location: [facility];  Service: Endoscopy;  Laterality: N/A;  730   EYE SURGERY  cataract x 2   MALONEY DILATION N/A 11/04/2013   Procedure: MALONEY DILATION;  Surgeon: Malissa Hippo, MD;  Location: [facility];  Service: Endoscopy;  Laterality: N/A;   UPPER GASTROINTESTINAL ENDOSCOPY      Home Medications:  Allergies as of 10/01/2021       Reactions   Penicillins Itching, Rash   Hydrocodone Other (See Comments)   MAO-I ; cannot take medications.    Morphine And Related    Unable to take due to MAO-I   Morphine Sulfate Other (See Comments)   Sulfamethoxazole-trimethoprim Nausea Only, Other (See Comments)   And diarrhea. And diarrhea.        Medication List         Accurate as of October 01, 2021 12:28 PM. If you have any questions, ask your nurse or doctor.          acetaminophen 650 MG CR tablet Commonly known as: TYLENOL Take 1,300 mg by mouth daily.   ARIPiprazole 5 MG tablet Commonly known as: ABILIFY Take 5 mg by mouth daily.   L-Methylfolate Forte 15-90.314 MG Caps Take 1 tablet by mouth daily.   levothyroxine 88 MCG tablet Commonly known as: SYNTHROID Take 88 mcg by mouth daily before breakfast.   LORazepam 0.5 MG tablet Commonly known as: ATIVAN Take 0.25 mg by mouth 2 (two) times daily.   losartan 50 MG tablet Commonly known as: COZAAR Take 75 mg by mouth See admin instructions. TAKE 75 IN MORNING AND 25 EVERY EVENING   losartan 25 MG tablet Commonly known as: COZAAR Take 50 mg by mouth 2 (two) times daily.   metoprolol succinate 25 MG 24 hr tablet Commonly known as: TOPROL-XL Take 25 mg by mouth daily.   multivitamin with minerals tablet Take 1 tablet by mouth daily.   nitrofurantoin 50 MG capsule Commonly known as: MACRODANTIN Take 1 capsule (50 mg total) by mouth at bedtime.   OVER THE COUNTER MEDICATION B12 1,000 mcg daily   OVER THE COUNTER MEDICATION Vit D once per week.  OVER THE COUNTER MEDICATION B complex with C once daily.   pantoprazole 40 MG tablet Commonly known as: PROTONIX Take 1 tablet (40 mg total) by mouth daily before breakfast.   phenelzine 15 MG tablet Commonly known as: NARDIL Take 15 mg by mouth 2 (two) times daily.   Probiotic (Lactobacillus) Caps Take 1 capsule by mouth daily.   Rexulti 1 MG Tabs tablet Generic drug: brexpiprazole Take 1 mg by mouth daily.   venlafaxine XR 37.5 MG 24 hr capsule Commonly known as: EFFEXOR-XR Take 37.5 mg by mouth daily.        Allergies:  Allergies  Allergen Reactions   Penicillins Itching and Rash   Hydrocodone Other (See Comments)    MAO-I ; cannot take medications.    Morphine And Related     Unable to take due to  MAO-I   Morphine Sulfate Other (See Comments)   Sulfamethoxazole-Trimethoprim Nausea Only and Other (See Comments)    And diarrhea. And diarrhea.     Family History: Family History  Problem Relation Age of Onset   Colon cancer Mother    Bipolar disorder Mother    Anxiety disorder Mother    Atrial fibrillation Son    Depression Maternal Grandmother     Social History:  reports that she quit smoking about 40 years ago. Her smoking use included cigarettes. She has a 40.50 pack-year smoking history. She quit smokeless tobacco use about 40 years ago. She reports current alcohol use of about 1.0 standard drink of alcohol per week. She reports that she does not use drugs.  ROS: All other review of systems were reviewed and are negative except what is noted above in HPI  Physical Exam: BP 137/83   Pulse (!) 101   Constitutional:  Alert and oriented, No acute distress. HEENT: [place] AT, moist mucus membranes.  Trachea midline, no masses. Cardiovascular: No clubbing, cyanosis, or edema. Respiratory: Normal respiratory effort, no increased work of breathing. GI: Abdomen is soft, nontender, nondistended, no abdominal masses GU: No CVA tenderness.  Lymph: No cervical or inguinal lymphadenopathy. Skin: No rashes, bruises or suspicious lesions. Neurologic: Grossly intact, no focal deficits, moving all 4 extremities. Psychiatric: Normal mood and affect.  Laboratory Data: Lab Results  Component Value Date   WBC 8.7 05/06/2020   HGB 9.1 (L) 05/06/2020   HCT 28.4 (L) 05/06/2020   MCV 100.4 (H) 05/06/2020   PLT 173 05/06/2020    Lab Results  Component Value Date   CREATININE 0.93 05/03/2020    No results found for: "PSA"  No results found for: "TESTOSTERONE"  No results found for: "HGBA1C"  Urinalysis    Component Value Date/Time   COLORURINE STRAW (A) 05/02/2020 0842   APPEARANCEUR Clear 05/25/2020 1146   LABSPEC 1.003 (L) 05/02/2020 0842   PHURINE 6.0 05/02/2020 0842    GLUCOSEU Negative 05/25/2020 1146   HGBUR MODERATE (A) 05/02/2020 0842   BILIRUBINUR Negative 05/25/2020 1146   KETONESUR NEGATIVE 05/02/2020 0842   PROTEINUR Negative 05/25/2020 1146   PROTEINUR NEGATIVE 05/02/2020 0842   NITRITE Negative 05/25/2020 1146   NITRITE NEGATIVE 05/02/2020 0842   LEUKOCYTESUR Trace (A) 05/25/2020 1146   LEUKOCYTESUR SMALL (A) 05/02/2020 0842    Lab Results  Component Value Date   LABMICR See below: 05/25/2020   WBCUA 6-10 (A) 05/25/2020   LABEPIT >10 (A) 05/25/2020   BACTERIA Few 05/25/2020    Pertinent Imaging: Renal US 09/17/2021: Images reviewed and discussed with the patient Results for orders placed during the  hospital encounter of 01/07/21  Abdomen 1 view (KUB)  Narrative CLINICAL DATA:  History of recurrent UTIs.  Nephrolithiasis.  EXAM: ABDOMEN - 1 VIEW  COMPARISON:  CT 05/02/2020  FINDINGS: Bowel gas pattern is nonobstructive. No free peritoneal air. No calcifications over the kidneys or course of the ureters. Several surgical clips over the lower abdomen and pelvis. Moderate curvature of the lumbar spine convex left. Mild degenerate change of the hips.  IMPRESSION: Nonobstructive bowel gas pattern.   Electronically Signed By: Elberta Fortis M.D. On: 01/09/2021 08:38  No results found for this or any previous visit.  No results found for this or any previous visit.  No results found for this or any previous visit.  Results for orders placed during the hospital encounter of 09/17/21  Ultrasound renal complete  Narrative CLINICAL DATA:  Nephrolithiasis  EXAM: RENAL / URINARY TRACT ULTRASOUND COMPLETE  COMPARISON:  Radiograph 01/07/2021, CT 05/02/2020  FINDINGS: Right Kidney:  Renal measurements: 9.4 by 5.3 x 5.5 cm = volume: 142.5 mL. Echogenicity within normal limits. No mass or hydronephrosis visualized.  Left Kidney:  Renal measurements: 10.1 x 5.1 x 4.7 cm = volume: 124.8 mL. Echogenicity within normal  limits. No mass or hydronephrosis. Possible 8 mm stone midpole left kidney.  Bladder:  Appears normal for degree of bladder distention.  Other:  None.  IMPRESSION: 1. Negative for hydronephrosis. 2. Possible left kidney stone   Electronically Signed By: Jasmine Pang M.D. On: 09/19/2021 00:39  No results found for this or any previous visit.  No results found for this or any previous visit.  Results for orders placed during the hospital encounter of 03/12/15  CT RENAL STONE STUDY  Addendum 03/12/2015 12:29 PM ADDENDUM REPORT: 03/12/2015 12:27 ADDENDUM: The impression should read: Small bowel dilatation consistent with partial small bowel obstruction secondary to adhesions. No definitive mass lesion is noted. Electronically Signed By: Alcide Clever M.D. On: 03/12/2015 12:27  Narrative CLINICAL DATA:  Shortness of breath and abdominal pain  EXAM: CT CHEST, ABDOMEN AND PELVIS WITHOUT CONTRAST  TECHNIQUE: Multidetector CT imaging of the chest, abdomen and pelvis was performed following the standard protocol without IV contrast.  COMPARISON:  05/10/2006  FINDINGS: CT CHEST  Lungs are well aerated bilaterally. No focal confluent infiltrate or sizable effusion is noted. A ground-glass nodule is noted in the left lower lobe best seen on image number 27 of series 6. This is stable from the prior exam. No other focal nodules are seen.  The thoracic aorta show some calcifications without aneurysmal dilatation. No hilar or mediastinal adenopathy is noted. Mild fluid is noted in the distal esophagus which may be related to reflux. Small hiatal hernia is noted. No acute bony abnormality is seen.  CT ABDOMEN AND PELVIS  The liver, gallbladder, spleen, adrenal glands and pancreas are within normal limits. The kidneys are well visualized bilaterally without evidence of renal calculi or urinary tract obstructive changes. Diffuse small bowel dilatation is noted with a  focal area of relatively smooth transition in the anterior abdomen best seen on image number 82 of series 2. There postsurgical changes in the anterior abdominal wall on these changes are likely related to adhesions as no definitive mass lesion is seen. More distal small bowel is within normal limits. Mild diverticular change of the colon as well as changes of prior colectomy are seen. No diverticulitis is noted.  The appendix is not well seen although no inflammatory changes are noted. The bladder is well distended. The  uterus has been surgically removed. Degenerative changes of lumbar spine are noted.  IMPRESSION: Ground-glass nodule in the left lower lobe stable from the prior exam and likely benign in etiology.  Small bowel dilatation likely related to adhesions. No definitive mass lesion is seen.  No other acute abnormality is noted.  Electronically Signed: By: Alcide Clever M.D. On: 03/12/2015 12:17   Assessment & Plan:    1. Nephrolithiasis -RTC 1 year with renal US - Urinalysis, Routine w reflex microscopic  2. Recurrent UTI -Continue macrodantin 50mg  qhs   No follow-ups on file.  , MD  [facility] [place]

## 2021-10-01 NOTE — Patient Instructions (Signed)

## 2021-10-03 LAB — URINE CULTURE

## 2021-10-05 ENCOUNTER — Telehealth: Payer: Self-pay

## 2021-10-05 NOTE — Telephone Encounter (Signed)
Culture result set to my chart. Will follow up with phone call if necessary .

## 2021-10-05 NOTE — Telephone Encounter (Signed)
-----   Message from Malen Gauze, MD sent at 10/05/2021 10:49 AM EDT ----- Continue macrobid ----- Message ----- From: Troy Sine, CMA Sent: 10/04/2021  10:57 AM EDT To: Malen Gauze, MD  Please review patient taking Macrodantin

## 2021-10-13 DIAGNOSIS — Z23 Encounter for immunization: Secondary | ICD-10-CM | POA: Diagnosis not present

## 2021-10-13 DIAGNOSIS — F339 Major depressive disorder, recurrent, unspecified: Secondary | ICD-10-CM | POA: Diagnosis not present

## 2021-10-13 DIAGNOSIS — E039 Hypothyroidism, unspecified: Secondary | ICD-10-CM | POA: Diagnosis not present

## 2021-10-13 DIAGNOSIS — I10 Essential (primary) hypertension: Secondary | ICD-10-CM | POA: Diagnosis not present

## 2021-11-01 DIAGNOSIS — N289 Disorder of kidney and ureter, unspecified: Secondary | ICD-10-CM | POA: Diagnosis not present

## 2021-11-04 DIAGNOSIS — F333 Major depressive disorder, recurrent, severe with psychotic symptoms: Secondary | ICD-10-CM | POA: Diagnosis not present

## 2021-12-14 DIAGNOSIS — F333 Major depressive disorder, recurrent, severe with psychotic symptoms: Secondary | ICD-10-CM | POA: Diagnosis not present

## 2021-12-22 DIAGNOSIS — M79641 Pain in right hand: Secondary | ICD-10-CM | POA: Diagnosis not present

## 2021-12-22 DIAGNOSIS — M6780 Other specified disorders of synovium and tendon, unspecified site: Secondary | ICD-10-CM | POA: Diagnosis not present

## 2021-12-22 DIAGNOSIS — M13849 Other specified arthritis, unspecified hand: Secondary | ICD-10-CM | POA: Diagnosis not present

## 2021-12-22 DIAGNOSIS — M79642 Pain in left hand: Secondary | ICD-10-CM | POA: Diagnosis not present

## 2021-12-28 ENCOUNTER — Other Ambulatory Visit: Payer: Self-pay | Admitting: Orthopedic Surgery

## 2021-12-28 DIAGNOSIS — F419 Anxiety disorder, unspecified: Secondary | ICD-10-CM

## 2021-12-28 MED ORDER — LORAZEPAM 0.5 MG PO TABS: 0.5000 mg | ORAL_TABLET | Freq: Two times a day (BID) | ORAL | 0 refills | Status: DC

## 2021-12-29 DIAGNOSIS — Z85828 Personal history of other malignant neoplasm of skin: Secondary | ICD-10-CM | POA: Diagnosis not present

## 2021-12-29 DIAGNOSIS — L570 Actinic keratosis: Secondary | ICD-10-CM | POA: Diagnosis not present

## 2021-12-29 DIAGNOSIS — L723 Sebaceous cyst: Secondary | ICD-10-CM | POA: Diagnosis not present

## 2021-12-29 DIAGNOSIS — D225 Melanocytic nevi of trunk: Secondary | ICD-10-CM | POA: Diagnosis not present

## 2022-01-22 ENCOUNTER — Encounter (INDEPENDENT_AMBULATORY_CARE_PROVIDER_SITE_OTHER): Payer: Self-pay | Admitting: Gastroenterology

## 2022-01-24 DIAGNOSIS — Z79899 Other long term (current) drug therapy: Secondary | ICD-10-CM | POA: Diagnosis not present

## 2022-01-24 DIAGNOSIS — F333 Major depressive disorder, recurrent, severe with psychotic symptoms: Secondary | ICD-10-CM | POA: Diagnosis not present

## 2022-03-03 DIAGNOSIS — A3790 Whooping cough, unspecified species without pneumonia: Secondary | ICD-10-CM | POA: Diagnosis not present

## 2022-03-29 DIAGNOSIS — F333 Major depressive disorder, recurrent, severe with psychotic symptoms: Secondary | ICD-10-CM | POA: Diagnosis not present

## 2022-05-31 IMAGING — CT CT ABD-PELV W/ CM
2 series · 15 of 37 positions shown, 19 images · IV contrast (Omnipaque or Isovue)
Comparison: 09/26/2019

CLINICAL DATA: Right-sided abdominal pain and nausea

EXAM:
CT ABDOMEN AND PELVIS WITH CONTRAST
TECHNIQUE: Multidetector CT imaging of the abdomen and pelvis was performed
using the standard protocol following bolus administration of
intravenous contrast.
CONTRAST:  75mL OMNIPAQUE IOHEXOL 300 MG/ML  SOLN

[Series 5: coronal st · coronal · 0.74mm/px · 3 of 82 slices shown]
[im 28/82  soft-tissue]
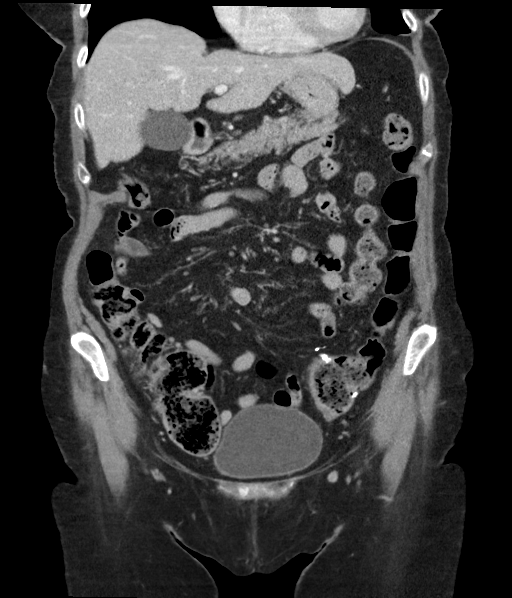
[im 37/82  soft-tissue]
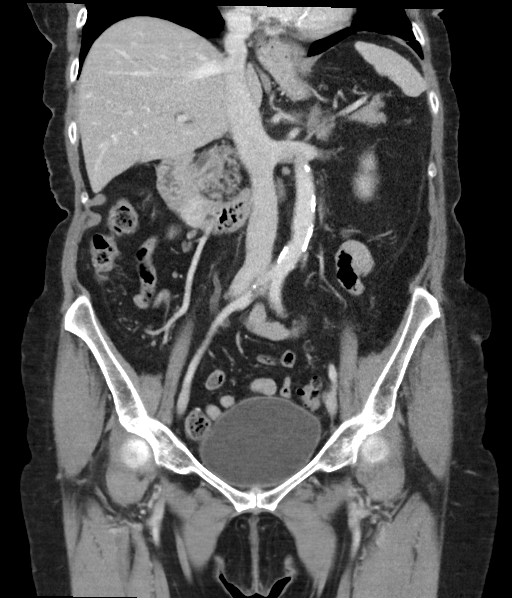
[im 46/82  soft-tissue]
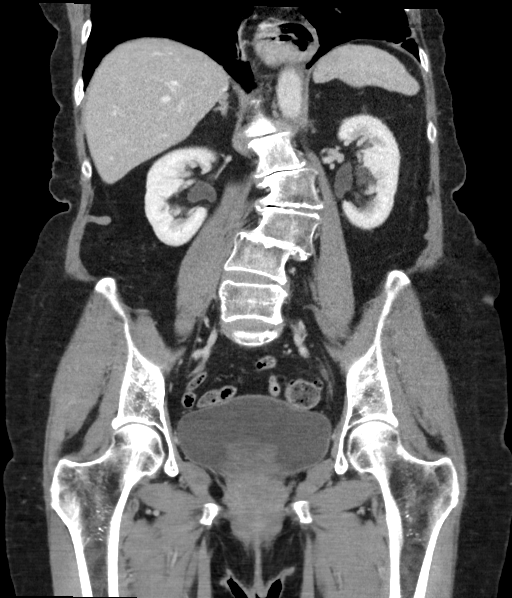

[Series 7: delay · axial · delayed · 0.79mm/px · z∈[-442,-327]mm · 12 of 27 slices shown, 16 images]
[im 3/27  soft-tissue]
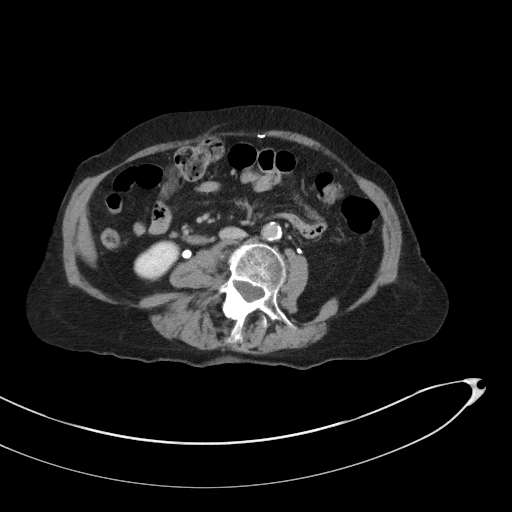
[im 3/27  bone]
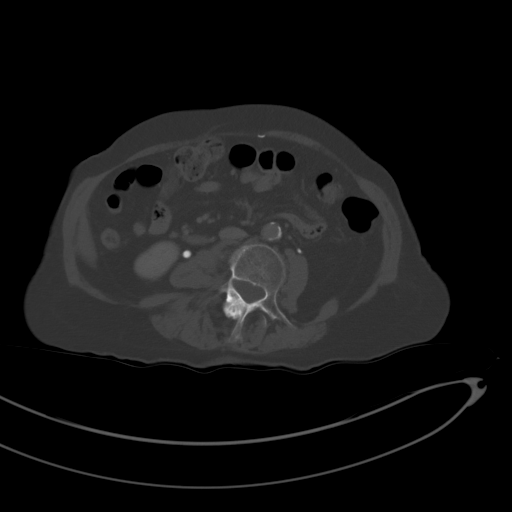
[im 5/27  soft-tissue]
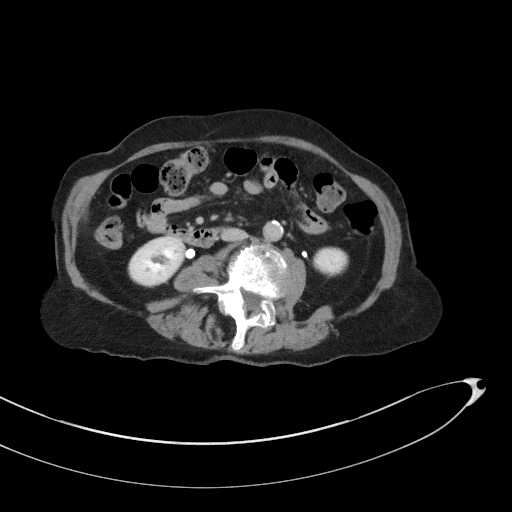
[im 8/27  soft-tissue]
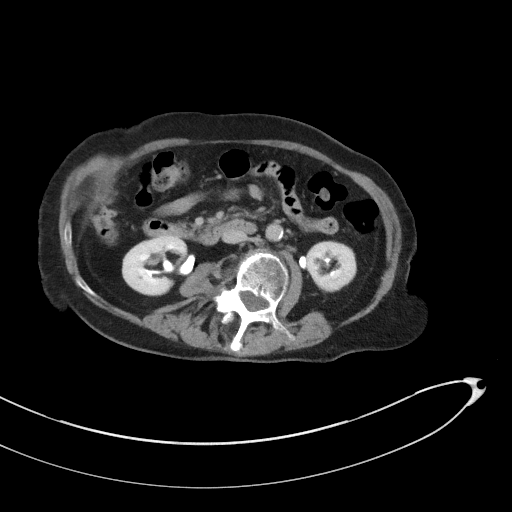
[im 10/27  soft-tissue]
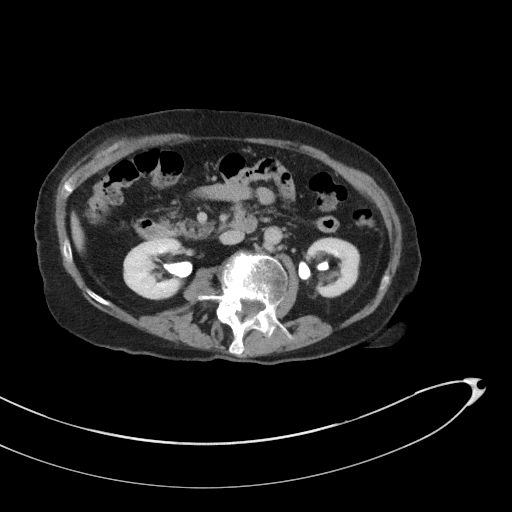
[im 13/27  soft-tissue]
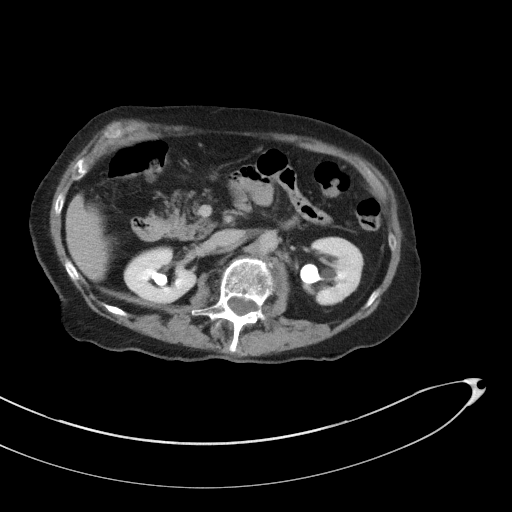
[im 15/27  soft-tissue]
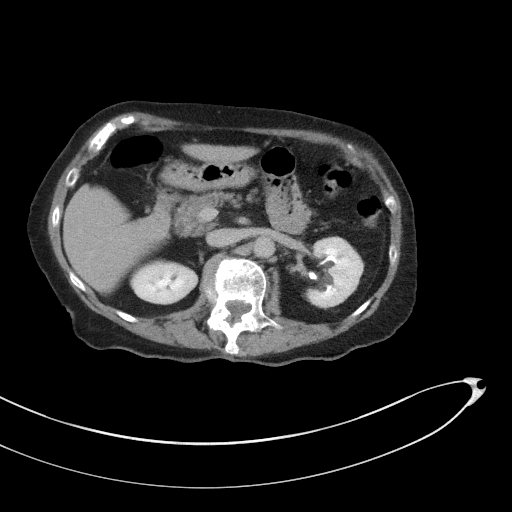
[im 18/27  soft-tissue]
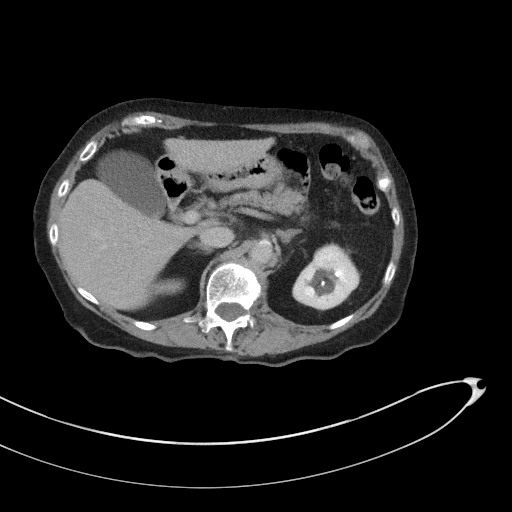
[im 20/27  soft-tissue]
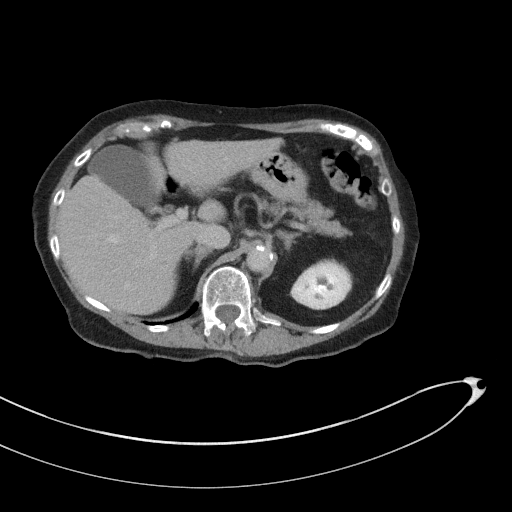
[im 23/27  soft-tissue]
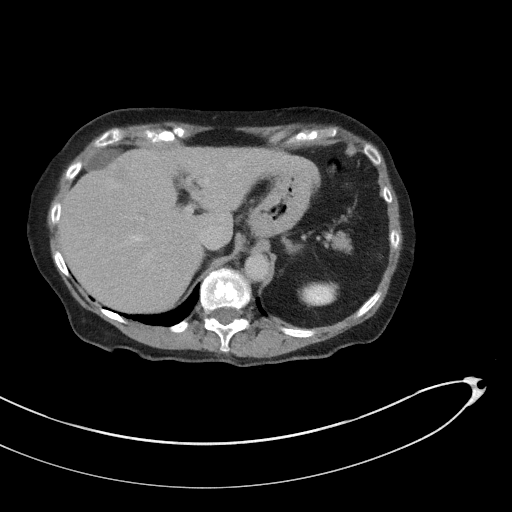
[im 23/27  lung]
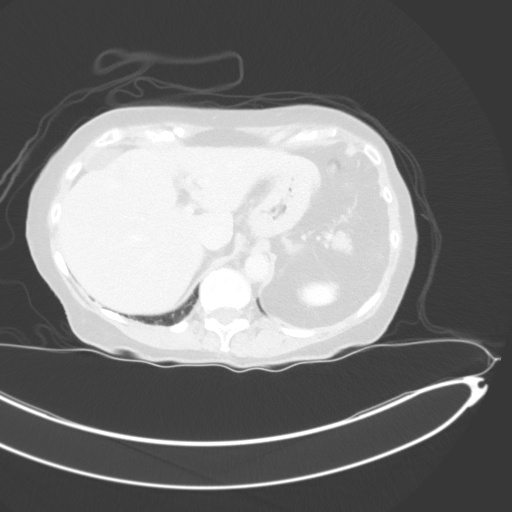
[im 23/27  bone]
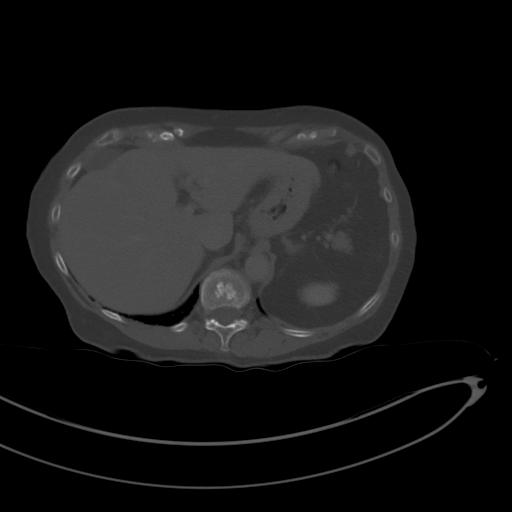
[im 24/27  lung]
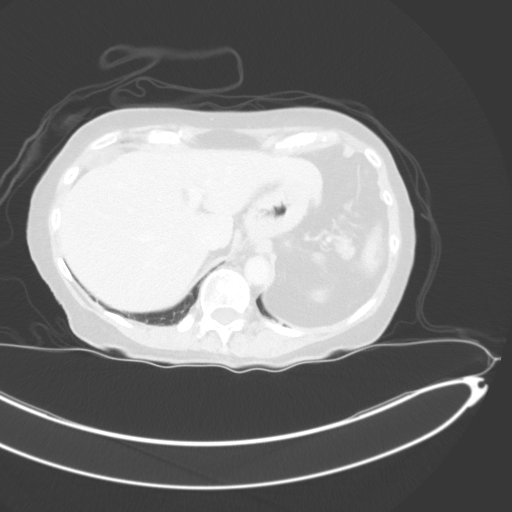
[im 25/27  soft-tissue]
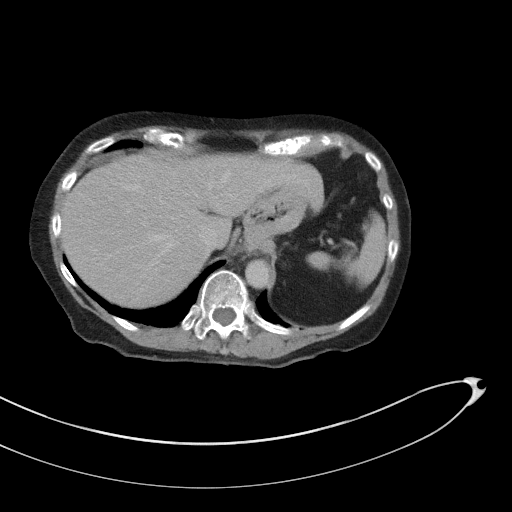
[im 25/27  lung]
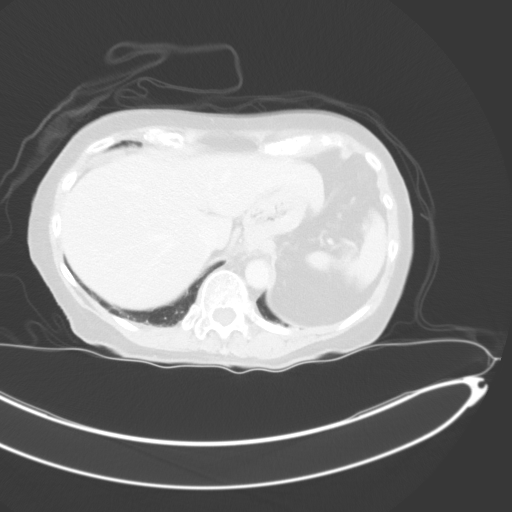
[im 26/27  lung]
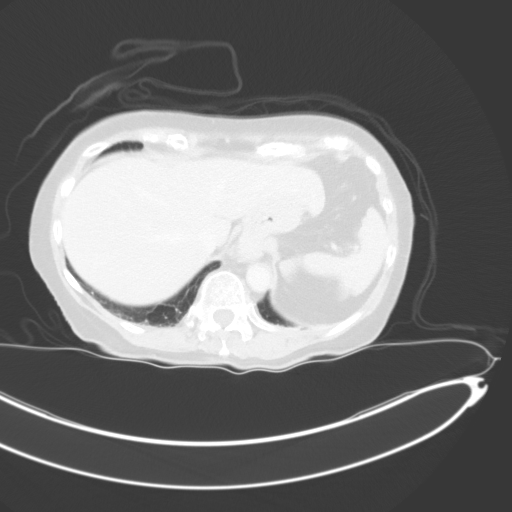

[15 of 37 positions shown; findings below may reference images not displayed]

FINDINGS: LOWER CHEST: Normal.

HEPATOBILIARY: Normal hepatic contours. No intra- or extrahepatic
biliary dilatation. There is cholelithiasis without acute
inflammation.

PANCREAS: Normal pancreas. No ductal dilatation or peripancreatic
fluid collection.

SPLEEN: Normal.

ADRENALS/URINARY TRACT: The adrenal glands are normal. No
hydronephrosis, nephroureterolithiasis or solid renal mass. The
urinary bladder is normal for degree of distention

STOMACH/BOWEL: Small hiatal hernia. No small bowel dilatation or
inflammation. Postsurgical changes of the distal descending colon.
Appendix is not seen and may be surgically absent.

VASCULAR/LYMPHATIC: There is calcific atherosclerosis of the
abdominal aorta. No lymphadenopathy.

REPRODUCTIVE: Status post hysterectomy. No adnexal mass.

MUSCULOSKELETAL. Levoscoliosis with apex at L2

OTHER: None.
IMPRESSION: 1. No acute abnormality of the abdomen or pelvis.
2. Aortic Atherosclerosis ([id number]).

## 2022-06-04 IMAGING — US US ABDOMEN LIMITED
1 series · 14 of 25 positions shown · non-contrast
Comparison: CT 12/26/2019

CLINICAL DATA: Right upper quadrant pain and nausea

EXAM:
ULTRASOUND ABDOMEN LIMITED RIGHT UPPER QUADRANT

[Series 1: us abdomen limited ruq (liver/gb) · 14 of 40 slices shown]
[im 1/40]
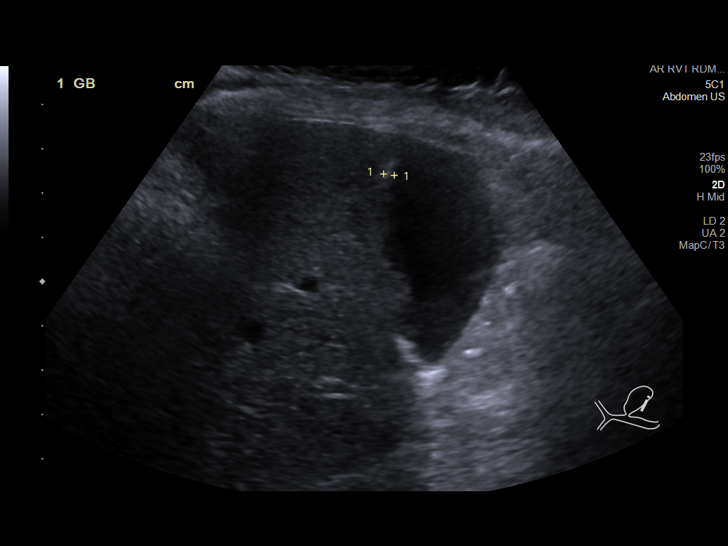
[im 4/40]
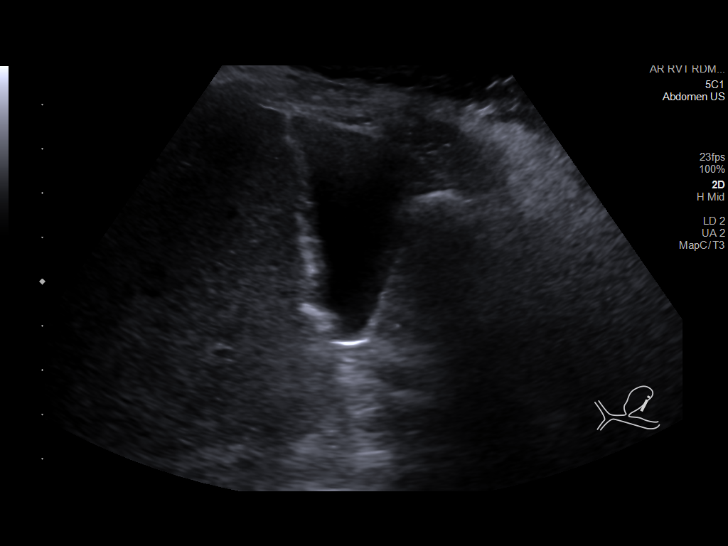
[im 7/40]
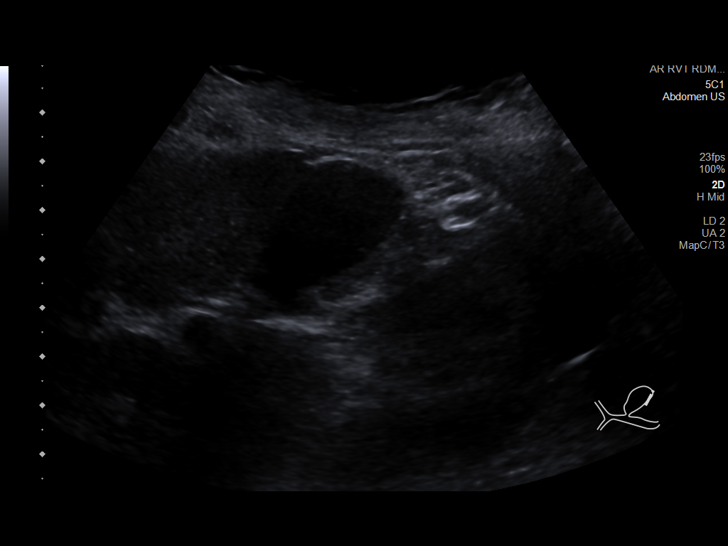
[im 10/40]
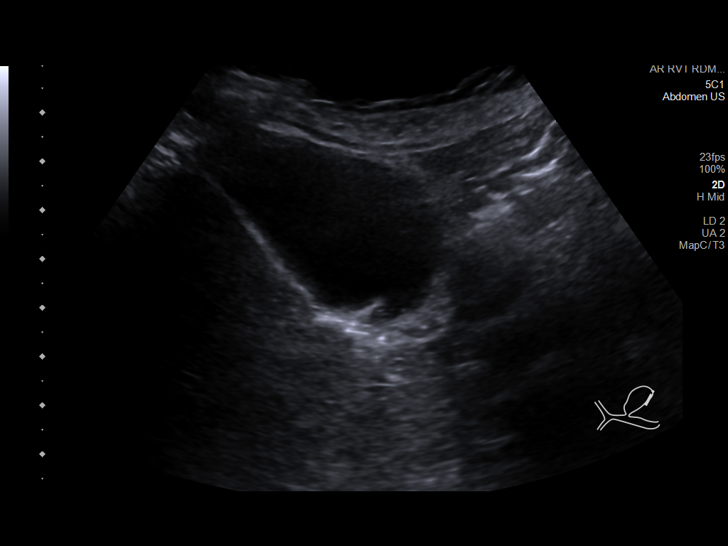
[im 14/40]
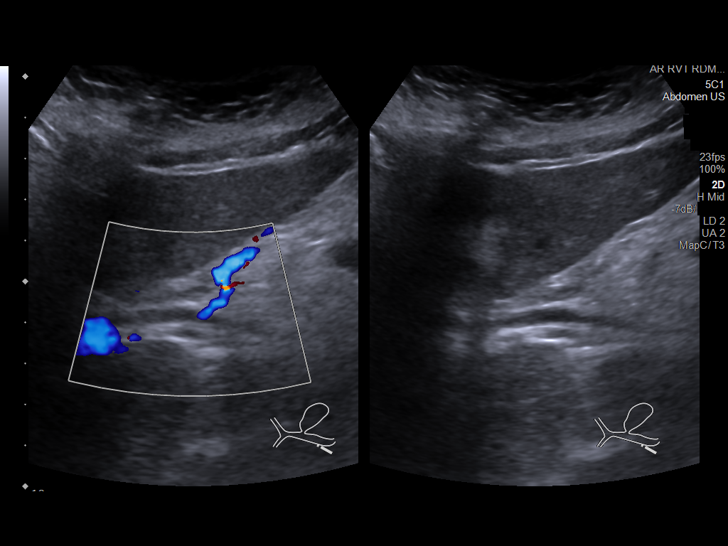
[im 15/40]
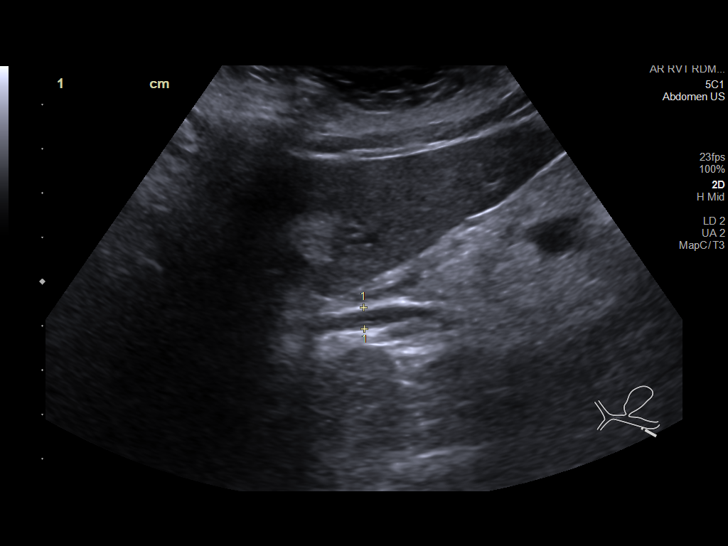
[im 18/40]
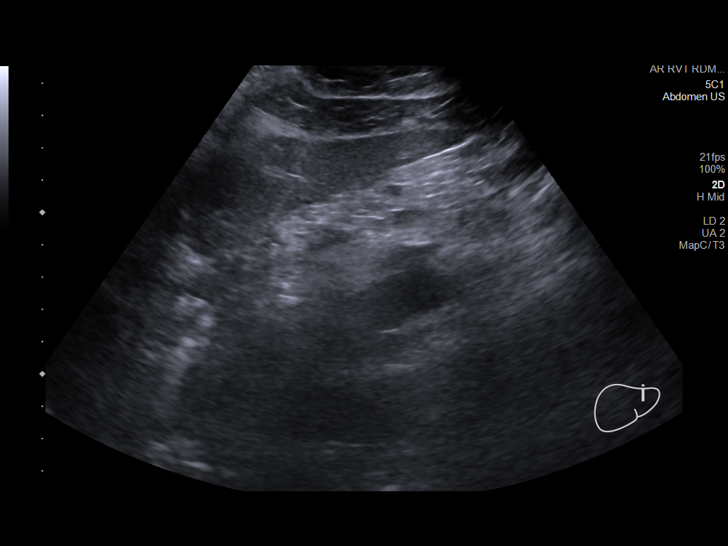
[im 22/40]
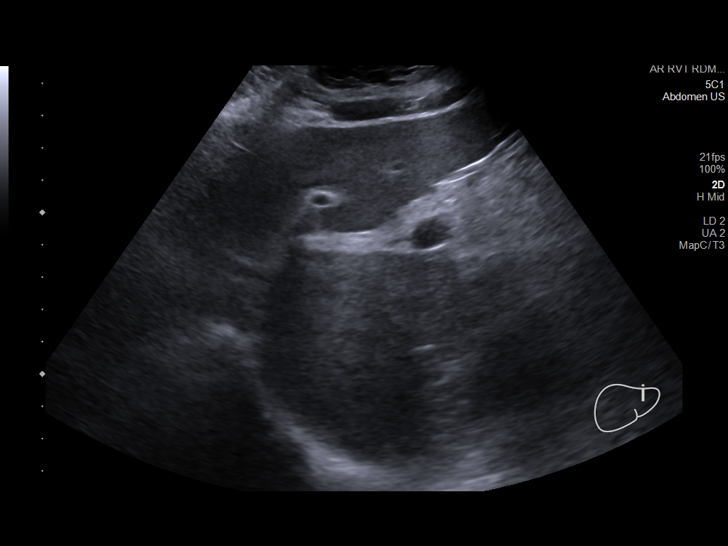
[im 25/40]
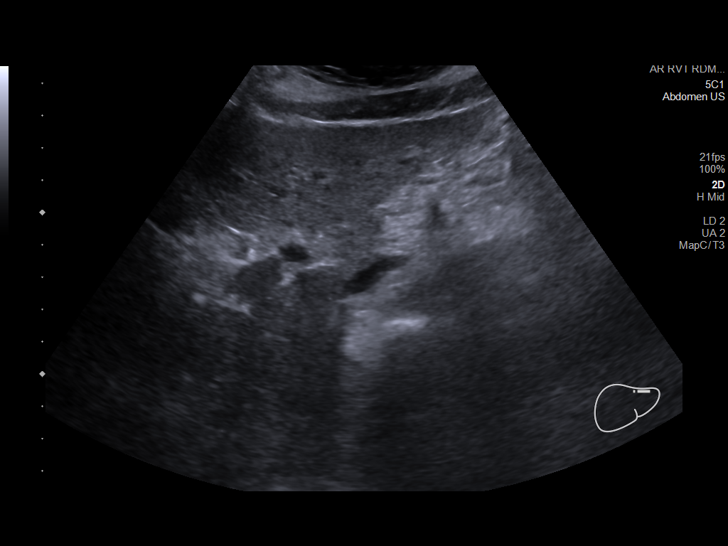
[im 27/40]
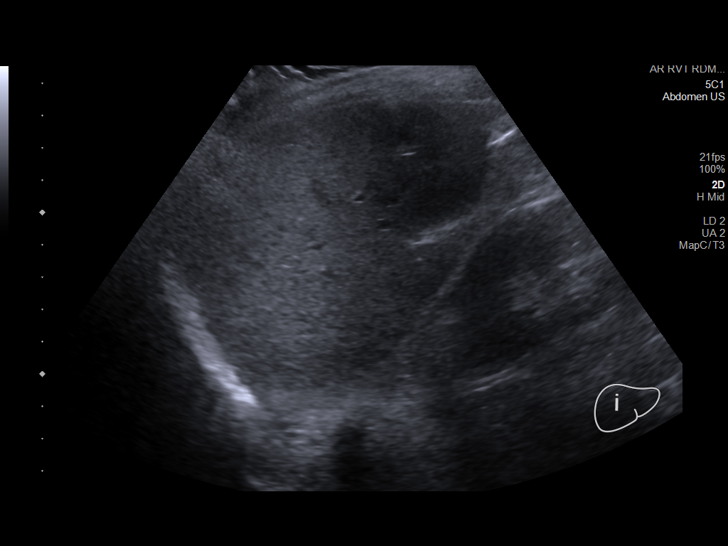
[im 30/40]
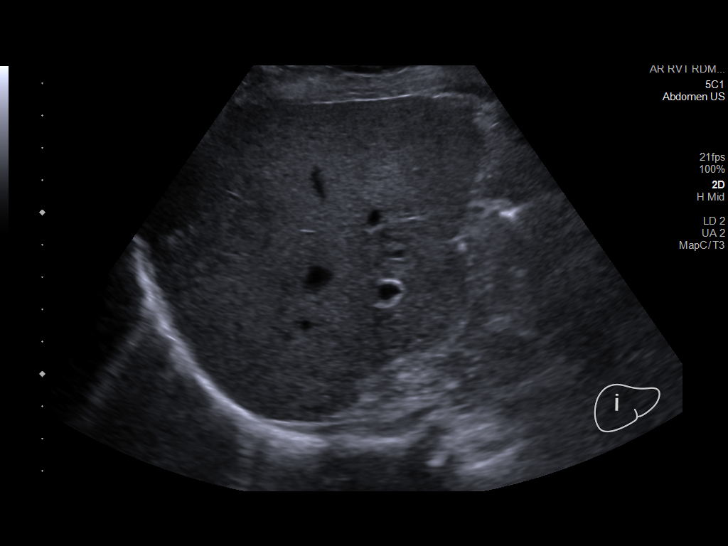
[im 33/40]
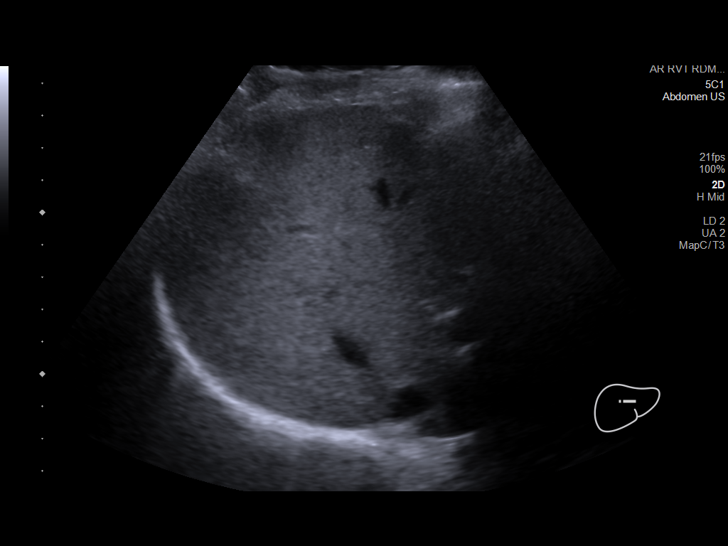
[im 36/40]
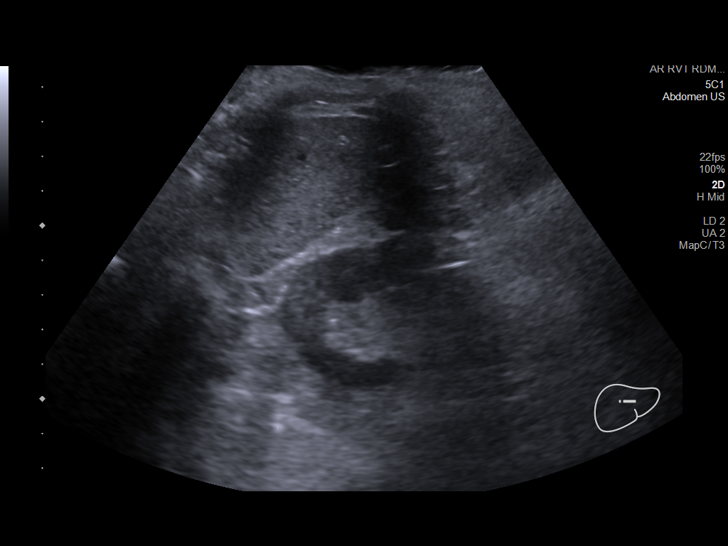
[im 40/40]
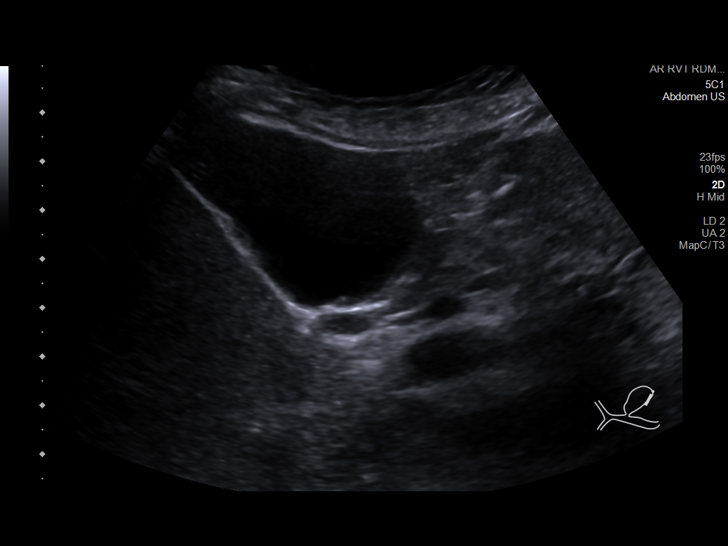

[14 of 25 positions shown; findings below may reference images not displayed]

FINDINGS: Gallbladder:

No gallstones or wall thickening visualized. No sonographic Murphy
sign noted by sonographer.

Common bile duct:

Diameter: 5 mm common normal

Liver:

No focal lesion identified. Within normal limits in parenchymal
echogenicity. Portal vein is patent on color Doppler imaging with
normal direction of blood flow towards the liver.

Other: None.
IMPRESSION: Normal examination.

## 2022-07-13 DIAGNOSIS — F339 Major depressive disorder, recurrent, unspecified: Secondary | ICD-10-CM | POA: Diagnosis not present

## 2022-07-13 DIAGNOSIS — I10 Essential (primary) hypertension: Secondary | ICD-10-CM | POA: Diagnosis not present

## 2022-07-13 DIAGNOSIS — N390 Urinary tract infection, site not specified: Secondary | ICD-10-CM | POA: Diagnosis not present

## 2022-07-13 DIAGNOSIS — N1831 Chronic kidney disease, stage 3a: Secondary | ICD-10-CM | POA: Diagnosis not present

## 2022-07-13 DIAGNOSIS — M0579 Rheumatoid arthritis with rheumatoid factor of multiple sites without organ or systems involvement: Secondary | ICD-10-CM | POA: Diagnosis not present

## 2022-07-13 DIAGNOSIS — M21949 Unspecified acquired deformity of hand, unspecified hand: Secondary | ICD-10-CM | POA: Diagnosis not present

## 2022-07-13 DIAGNOSIS — I700 Atherosclerosis of aorta: Secondary | ICD-10-CM | POA: Diagnosis not present

## 2022-07-13 DIAGNOSIS — E039 Hypothyroidism, unspecified: Secondary | ICD-10-CM | POA: Diagnosis not present

## 2022-07-13 DIAGNOSIS — R2681 Unsteadiness on feet: Secondary | ICD-10-CM | POA: Diagnosis not present

## 2022-07-28 DIAGNOSIS — M47816 Spondylosis without myelopathy or radiculopathy, lumbar region: Secondary | ICD-10-CM | POA: Diagnosis not present

## 2022-07-28 DIAGNOSIS — M6389 Disorders of muscle in diseases classified elsewhere, multiple sites: Secondary | ICD-10-CM | POA: Diagnosis not present

## 2022-07-28 DIAGNOSIS — R278 Other lack of coordination: Secondary | ICD-10-CM | POA: Diagnosis not present

## 2022-08-02 DIAGNOSIS — R278 Other lack of coordination: Secondary | ICD-10-CM | POA: Diagnosis not present

## 2022-08-02 DIAGNOSIS — M6389 Disorders of muscle in diseases classified elsewhere, multiple sites: Secondary | ICD-10-CM | POA: Diagnosis not present

## 2022-08-02 DIAGNOSIS — M47816 Spondylosis without myelopathy or radiculopathy, lumbar region: Secondary | ICD-10-CM | POA: Diagnosis not present

## 2022-08-03 DIAGNOSIS — M47816 Spondylosis without myelopathy or radiculopathy, lumbar region: Secondary | ICD-10-CM | POA: Diagnosis not present

## 2022-08-03 DIAGNOSIS — M6389 Disorders of muscle in diseases classified elsewhere, multiple sites: Secondary | ICD-10-CM | POA: Diagnosis not present

## 2022-08-03 DIAGNOSIS — R278 Other lack of coordination: Secondary | ICD-10-CM | POA: Diagnosis not present

## 2022-08-04 DIAGNOSIS — M6389 Disorders of muscle in diseases classified elsewhere, multiple sites: Secondary | ICD-10-CM | POA: Diagnosis not present

## 2022-08-04 DIAGNOSIS — R278 Other lack of coordination: Secondary | ICD-10-CM | POA: Diagnosis not present

## 2022-08-04 DIAGNOSIS — M47816 Spondylosis without myelopathy or radiculopathy, lumbar region: Secondary | ICD-10-CM | POA: Diagnosis not present

## 2022-08-05 DIAGNOSIS — M6389 Disorders of muscle in diseases classified elsewhere, multiple sites: Secondary | ICD-10-CM | POA: Diagnosis not present

## 2022-08-05 DIAGNOSIS — R278 Other lack of coordination: Secondary | ICD-10-CM | POA: Diagnosis not present

## 2022-08-05 DIAGNOSIS — M47816 Spondylosis without myelopathy or radiculopathy, lumbar region: Secondary | ICD-10-CM | POA: Diagnosis not present

## 2022-08-08 DIAGNOSIS — M6389 Disorders of muscle in diseases classified elsewhere, multiple sites: Secondary | ICD-10-CM | POA: Diagnosis not present

## 2022-08-08 DIAGNOSIS — R278 Other lack of coordination: Secondary | ICD-10-CM | POA: Diagnosis not present

## 2022-08-08 DIAGNOSIS — M47816 Spondylosis without myelopathy or radiculopathy, lumbar region: Secondary | ICD-10-CM | POA: Diagnosis not present

## 2022-08-11 DIAGNOSIS — R278 Other lack of coordination: Secondary | ICD-10-CM | POA: Diagnosis not present

## 2022-08-11 DIAGNOSIS — M47816 Spondylosis without myelopathy or radiculopathy, lumbar region: Secondary | ICD-10-CM | POA: Diagnosis not present

## 2022-08-11 DIAGNOSIS — M6389 Disorders of muscle in diseases classified elsewhere, multiple sites: Secondary | ICD-10-CM | POA: Diagnosis not present

## 2022-08-15 DIAGNOSIS — M6389 Disorders of muscle in diseases classified elsewhere, multiple sites: Secondary | ICD-10-CM | POA: Diagnosis not present

## 2022-08-15 DIAGNOSIS — M47816 Spondylosis without myelopathy or radiculopathy, lumbar region: Secondary | ICD-10-CM | POA: Diagnosis not present

## 2022-08-15 DIAGNOSIS — R278 Other lack of coordination: Secondary | ICD-10-CM | POA: Diagnosis not present

## 2022-08-17 DIAGNOSIS — M6389 Disorders of muscle in diseases classified elsewhere, multiple sites: Secondary | ICD-10-CM | POA: Diagnosis not present

## 2022-08-17 DIAGNOSIS — R278 Other lack of coordination: Secondary | ICD-10-CM | POA: Diagnosis not present

## 2022-08-17 DIAGNOSIS — M47816 Spondylosis without myelopathy or radiculopathy, lumbar region: Secondary | ICD-10-CM | POA: Diagnosis not present

## 2022-08-19 DIAGNOSIS — M47816 Spondylosis without myelopathy or radiculopathy, lumbar region: Secondary | ICD-10-CM | POA: Diagnosis not present

## 2022-08-19 DIAGNOSIS — M6389 Disorders of muscle in diseases classified elsewhere, multiple sites: Secondary | ICD-10-CM | POA: Diagnosis not present

## 2022-08-19 DIAGNOSIS — R278 Other lack of coordination: Secondary | ICD-10-CM | POA: Diagnosis not present

## 2022-08-22 DIAGNOSIS — M47816 Spondylosis without myelopathy or radiculopathy, lumbar region: Secondary | ICD-10-CM | POA: Diagnosis not present

## 2022-08-22 DIAGNOSIS — M6389 Disorders of muscle in diseases classified elsewhere, multiple sites: Secondary | ICD-10-CM | POA: Diagnosis not present

## 2022-08-22 DIAGNOSIS — R278 Other lack of coordination: Secondary | ICD-10-CM | POA: Diagnosis not present

## 2022-08-24 DIAGNOSIS — M47816 Spondylosis without myelopathy or radiculopathy, lumbar region: Secondary | ICD-10-CM | POA: Diagnosis not present

## 2022-08-24 DIAGNOSIS — M6389 Disorders of muscle in diseases classified elsewhere, multiple sites: Secondary | ICD-10-CM | POA: Diagnosis not present

## 2022-08-24 DIAGNOSIS — R2681 Unsteadiness on feet: Secondary | ICD-10-CM | POA: Diagnosis not present

## 2022-08-24 DIAGNOSIS — R278 Other lack of coordination: Secondary | ICD-10-CM | POA: Diagnosis not present

## 2022-08-24 DIAGNOSIS — M6259 Muscle wasting and atrophy, not elsewhere classified, multiple sites: Secondary | ICD-10-CM | POA: Diagnosis not present

## 2022-08-26 DIAGNOSIS — R278 Other lack of coordination: Secondary | ICD-10-CM | POA: Diagnosis not present

## 2022-08-26 DIAGNOSIS — M6389 Disorders of muscle in diseases classified elsewhere, multiple sites: Secondary | ICD-10-CM | POA: Diagnosis not present

## 2022-08-26 DIAGNOSIS — M47816 Spondylosis without myelopathy or radiculopathy, lumbar region: Secondary | ICD-10-CM | POA: Diagnosis not present

## 2022-08-26 DIAGNOSIS — R2681 Unsteadiness on feet: Secondary | ICD-10-CM | POA: Diagnosis not present

## 2022-08-26 DIAGNOSIS — M6259 Muscle wasting and atrophy, not elsewhere classified, multiple sites: Secondary | ICD-10-CM | POA: Diagnosis not present

## 2022-08-29 DIAGNOSIS — M6389 Disorders of muscle in diseases classified elsewhere, multiple sites: Secondary | ICD-10-CM | POA: Diagnosis not present

## 2022-08-29 DIAGNOSIS — M47816 Spondylosis without myelopathy or radiculopathy, lumbar region: Secondary | ICD-10-CM | POA: Diagnosis not present

## 2022-08-29 DIAGNOSIS — F333 Major depressive disorder, recurrent, severe with psychotic symptoms: Secondary | ICD-10-CM | POA: Diagnosis not present

## 2022-08-29 DIAGNOSIS — R278 Other lack of coordination: Secondary | ICD-10-CM | POA: Diagnosis not present

## 2022-08-31 DIAGNOSIS — M47816 Spondylosis without myelopathy or radiculopathy, lumbar region: Secondary | ICD-10-CM | POA: Diagnosis not present

## 2022-08-31 DIAGNOSIS — M6389 Disorders of muscle in diseases classified elsewhere, multiple sites: Secondary | ICD-10-CM | POA: Diagnosis not present

## 2022-08-31 DIAGNOSIS — R278 Other lack of coordination: Secondary | ICD-10-CM | POA: Diagnosis not present

## 2022-09-02 DIAGNOSIS — R278 Other lack of coordination: Secondary | ICD-10-CM | POA: Diagnosis not present

## 2022-09-02 DIAGNOSIS — M6389 Disorders of muscle in diseases classified elsewhere, multiple sites: Secondary | ICD-10-CM | POA: Diagnosis not present

## 2022-09-02 DIAGNOSIS — M47816 Spondylosis without myelopathy or radiculopathy, lumbar region: Secondary | ICD-10-CM | POA: Diagnosis not present

## 2022-09-05 DIAGNOSIS — R278 Other lack of coordination: Secondary | ICD-10-CM | POA: Diagnosis not present

## 2022-09-05 DIAGNOSIS — M6389 Disorders of muscle in diseases classified elsewhere, multiple sites: Secondary | ICD-10-CM | POA: Diagnosis not present

## 2022-09-05 DIAGNOSIS — M47816 Spondylosis without myelopathy or radiculopathy, lumbar region: Secondary | ICD-10-CM | POA: Diagnosis not present

## 2022-09-07 DIAGNOSIS — M6389 Disorders of muscle in diseases classified elsewhere, multiple sites: Secondary | ICD-10-CM | POA: Diagnosis not present

## 2022-09-07 DIAGNOSIS — M47816 Spondylosis without myelopathy or radiculopathy, lumbar region: Secondary | ICD-10-CM | POA: Diagnosis not present

## 2022-09-07 DIAGNOSIS — R278 Other lack of coordination: Secondary | ICD-10-CM | POA: Diagnosis not present

## 2022-09-09 DIAGNOSIS — M6389 Disorders of muscle in diseases classified elsewhere, multiple sites: Secondary | ICD-10-CM | POA: Diagnosis not present

## 2022-09-09 DIAGNOSIS — M47816 Spondylosis without myelopathy or radiculopathy, lumbar region: Secondary | ICD-10-CM | POA: Diagnosis not present

## 2022-09-09 DIAGNOSIS — R278 Other lack of coordination: Secondary | ICD-10-CM | POA: Diagnosis not present

## 2022-09-12 DIAGNOSIS — M47816 Spondylosis without myelopathy or radiculopathy, lumbar region: Secondary | ICD-10-CM | POA: Diagnosis not present

## 2022-09-12 DIAGNOSIS — M6389 Disorders of muscle in diseases classified elsewhere, multiple sites: Secondary | ICD-10-CM | POA: Diagnosis not present

## 2022-09-12 DIAGNOSIS — R278 Other lack of coordination: Secondary | ICD-10-CM | POA: Diagnosis not present

## 2022-09-13 ENCOUNTER — Other Ambulatory Visit: Payer: Self-pay

## 2022-09-13 DIAGNOSIS — N200 Calculus of kidney: Secondary | ICD-10-CM

## 2022-09-14 DIAGNOSIS — M6389 Disorders of muscle in diseases classified elsewhere, multiple sites: Secondary | ICD-10-CM | POA: Diagnosis not present

## 2022-09-14 DIAGNOSIS — M47816 Spondylosis without myelopathy or radiculopathy, lumbar region: Secondary | ICD-10-CM | POA: Diagnosis not present

## 2022-09-14 DIAGNOSIS — R278 Other lack of coordination: Secondary | ICD-10-CM | POA: Diagnosis not present

## 2022-09-16 DIAGNOSIS — M47816 Spondylosis without myelopathy or radiculopathy, lumbar region: Secondary | ICD-10-CM | POA: Diagnosis not present

## 2022-09-16 DIAGNOSIS — R278 Other lack of coordination: Secondary | ICD-10-CM | POA: Diagnosis not present

## 2022-09-16 DIAGNOSIS — M6389 Disorders of muscle in diseases classified elsewhere, multiple sites: Secondary | ICD-10-CM | POA: Diagnosis not present

## 2022-09-19 DIAGNOSIS — M47816 Spondylosis without myelopathy or radiculopathy, lumbar region: Secondary | ICD-10-CM | POA: Diagnosis not present

## 2022-09-19 DIAGNOSIS — M6389 Disorders of muscle in diseases classified elsewhere, multiple sites: Secondary | ICD-10-CM | POA: Diagnosis not present

## 2022-09-19 DIAGNOSIS — R278 Other lack of coordination: Secondary | ICD-10-CM | POA: Diagnosis not present

## 2022-09-21 DIAGNOSIS — M6389 Disorders of muscle in diseases classified elsewhere, multiple sites: Secondary | ICD-10-CM | POA: Diagnosis not present

## 2022-09-21 DIAGNOSIS — M47816 Spondylosis without myelopathy or radiculopathy, lumbar region: Secondary | ICD-10-CM | POA: Diagnosis not present

## 2022-09-21 DIAGNOSIS — R278 Other lack of coordination: Secondary | ICD-10-CM | POA: Diagnosis not present

## 2022-09-23 DIAGNOSIS — M47816 Spondylosis without myelopathy or radiculopathy, lumbar region: Secondary | ICD-10-CM | POA: Diagnosis not present

## 2022-09-23 DIAGNOSIS — M6389 Disorders of muscle in diseases classified elsewhere, multiple sites: Secondary | ICD-10-CM | POA: Diagnosis not present

## 2022-09-23 DIAGNOSIS — R278 Other lack of coordination: Secondary | ICD-10-CM | POA: Diagnosis not present

## 2022-09-26 DIAGNOSIS — M47816 Spondylosis without myelopathy or radiculopathy, lumbar region: Secondary | ICD-10-CM | POA: Diagnosis not present

## 2022-09-26 DIAGNOSIS — M6389 Disorders of muscle in diseases classified elsewhere, multiple sites: Secondary | ICD-10-CM | POA: Diagnosis not present

## 2022-09-26 DIAGNOSIS — R278 Other lack of coordination: Secondary | ICD-10-CM | POA: Diagnosis not present

## 2022-09-28 DIAGNOSIS — M47816 Spondylosis without myelopathy or radiculopathy, lumbar region: Secondary | ICD-10-CM | POA: Diagnosis not present

## 2022-09-28 DIAGNOSIS — R278 Other lack of coordination: Secondary | ICD-10-CM | POA: Diagnosis not present

## 2022-09-28 DIAGNOSIS — M6389 Disorders of muscle in diseases classified elsewhere, multiple sites: Secondary | ICD-10-CM | POA: Diagnosis not present

## 2022-09-29 ENCOUNTER — Other Ambulatory Visit (HOSPITAL_COMMUNITY): Payer: Medicare Other

## 2022-09-29 DIAGNOSIS — R2681 Unsteadiness on feet: Secondary | ICD-10-CM | POA: Diagnosis not present

## 2022-09-29 DIAGNOSIS — M6259 Muscle wasting and atrophy, not elsewhere classified, multiple sites: Secondary | ICD-10-CM | POA: Diagnosis not present

## 2022-09-29 DIAGNOSIS — M47816 Spondylosis without myelopathy or radiculopathy, lumbar region: Secondary | ICD-10-CM | POA: Diagnosis not present

## 2022-09-30 DIAGNOSIS — M47816 Spondylosis without myelopathy or radiculopathy, lumbar region: Secondary | ICD-10-CM | POA: Diagnosis not present

## 2022-09-30 DIAGNOSIS — M6389 Disorders of muscle in diseases classified elsewhere, multiple sites: Secondary | ICD-10-CM | POA: Diagnosis not present

## 2022-09-30 DIAGNOSIS — R278 Other lack of coordination: Secondary | ICD-10-CM | POA: Diagnosis not present

## 2022-10-05 ENCOUNTER — Ambulatory Visit: Payer: Medicare Other | Admitting: Urology

## 2022-11-08 DIAGNOSIS — R350 Frequency of micturition: Secondary | ICD-10-CM | POA: Diagnosis not present

## 2022-11-08 DIAGNOSIS — N390 Urinary tract infection, site not specified: Secondary | ICD-10-CM | POA: Diagnosis not present

## 2022-11-23 ENCOUNTER — Ambulatory Visit: Payer: Medicare Other | Admitting: Urology

## 2022-11-23 ENCOUNTER — Ambulatory Visit (HOSPITAL_COMMUNITY): Payer: Medicare Other

## 2022-12-27 DIAGNOSIS — F333 Major depressive disorder, recurrent, severe with psychotic symptoms: Secondary | ICD-10-CM | POA: Diagnosis not present

## 2023-01-31 DIAGNOSIS — Z0000 Encounter for general adult medical examination without abnormal findings: Secondary | ICD-10-CM | POA: Diagnosis not present

## 2023-01-31 DIAGNOSIS — E039 Hypothyroidism, unspecified: Secondary | ICD-10-CM | POA: Diagnosis not present

## 2023-01-31 DIAGNOSIS — I10 Essential (primary) hypertension: Secondary | ICD-10-CM | POA: Diagnosis not present

## 2023-01-31 DIAGNOSIS — F339 Major depressive disorder, recurrent, unspecified: Secondary | ICD-10-CM | POA: Diagnosis not present

## 2023-01-31 DIAGNOSIS — E559 Vitamin D deficiency, unspecified: Secondary | ICD-10-CM | POA: Diagnosis not present

## 2023-03-28 DIAGNOSIS — F333 Major depressive disorder, recurrent, severe with psychotic symptoms: Secondary | ICD-10-CM | POA: Diagnosis not present

## 2023-05-08 ENCOUNTER — Other Ambulatory Visit: Payer: Self-pay

## 2023-05-08 ENCOUNTER — Encounter (HOSPITAL_BASED_OUTPATIENT_CLINIC_OR_DEPARTMENT_OTHER): Payer: Self-pay

## 2023-05-08 ENCOUNTER — Emergency Department (HOSPITAL_BASED_OUTPATIENT_CLINIC_OR_DEPARTMENT_OTHER): Payer: Medicare Other

## 2023-05-08 ENCOUNTER — Inpatient Hospital Stay (HOSPITAL_BASED_OUTPATIENT_CLINIC_OR_DEPARTMENT_OTHER)
Admission: EM | Admit: 2023-05-08 | Discharge: 2023-05-18 | DRG: 388 | Disposition: A | Discharge status: Skilled Nursing Facility | Payer: Medicare Other | Attending: Internal Medicine | Admitting: Internal Medicine

## 2023-05-08 DIAGNOSIS — J984 Other disorders of lung: Secondary | ICD-10-CM | POA: Diagnosis not present

## 2023-05-08 DIAGNOSIS — R000 Tachycardia, unspecified: Secondary | ICD-10-CM | POA: Diagnosis present

## 2023-05-08 DIAGNOSIS — E8809 Other disorders of plasma-protein metabolism, not elsewhere classified: Secondary | ICD-10-CM | POA: Diagnosis not present

## 2023-05-08 DIAGNOSIS — Z1152 Encounter for screening for COVID-19: Secondary | ICD-10-CM

## 2023-05-08 DIAGNOSIS — D696 Thrombocytopenia, unspecified: Secondary | ICD-10-CM | POA: Diagnosis present

## 2023-05-08 DIAGNOSIS — K580 Irritable bowel syndrome with diarrhea: Secondary | ICD-10-CM

## 2023-05-08 DIAGNOSIS — Z933 Colostomy status: Secondary | ICD-10-CM | POA: Diagnosis not present

## 2023-05-08 DIAGNOSIS — Z818 Family history of other mental and behavioral disorders: Secondary | ICD-10-CM

## 2023-05-08 DIAGNOSIS — Z90710 Acquired absence of both cervix and uterus: Secondary | ICD-10-CM

## 2023-05-08 DIAGNOSIS — E039 Hypothyroidism, unspecified: Secondary | ICD-10-CM | POA: Diagnosis present

## 2023-05-08 DIAGNOSIS — K56600 Partial intestinal obstruction, unspecified as to cause: Principal | ICD-10-CM | POA: Diagnosis present

## 2023-05-08 DIAGNOSIS — I480 Paroxysmal atrial fibrillation: Secondary | ICD-10-CM | POA: Diagnosis present

## 2023-05-08 DIAGNOSIS — I110 Hypertensive heart disease with heart failure: Secondary | ICD-10-CM | POA: Diagnosis present

## 2023-05-08 DIAGNOSIS — Z87891 Personal history of nicotine dependence: Secondary | ICD-10-CM

## 2023-05-08 DIAGNOSIS — L89151 Pressure ulcer of sacral region, stage 1: Secondary | ICD-10-CM | POA: Diagnosis present

## 2023-05-08 DIAGNOSIS — I5031 Acute diastolic (congestive) heart failure: Secondary | ICD-10-CM | POA: Diagnosis not present

## 2023-05-08 DIAGNOSIS — E785 Hyperlipidemia, unspecified: Secondary | ICD-10-CM | POA: Diagnosis present

## 2023-05-08 DIAGNOSIS — I011 Acute rheumatic endocarditis: Secondary | ICD-10-CM | POA: Diagnosis present

## 2023-05-08 DIAGNOSIS — M0530 Rheumatoid heart disease with rheumatoid arthritis of unspecified site: Secondary | ICD-10-CM | POA: Diagnosis not present

## 2023-05-08 DIAGNOSIS — E860 Dehydration: Secondary | ICD-10-CM | POA: Diagnosis not present

## 2023-05-08 DIAGNOSIS — E876 Hypokalemia: Secondary | ICD-10-CM | POA: Diagnosis not present

## 2023-05-08 DIAGNOSIS — R109 Unspecified abdominal pain: Secondary | ICD-10-CM | POA: Diagnosis not present

## 2023-05-08 DIAGNOSIS — M1990 Unspecified osteoarthritis, unspecified site: Secondary | ICD-10-CM | POA: Diagnosis present

## 2023-05-08 DIAGNOSIS — Z7901 Long term (current) use of anticoagulants: Secondary | ICD-10-CM

## 2023-05-08 DIAGNOSIS — K449 Diaphragmatic hernia without obstruction or gangrene: Secondary | ICD-10-CM | POA: Diagnosis present

## 2023-05-08 DIAGNOSIS — R140 Abdominal distension (gaseous): Secondary | ICD-10-CM | POA: Diagnosis not present

## 2023-05-08 DIAGNOSIS — R110 Nausea: Secondary | ICD-10-CM

## 2023-05-08 DIAGNOSIS — R54 Age-related physical debility: Secondary | ICD-10-CM | POA: Diagnosis present

## 2023-05-08 DIAGNOSIS — F314 Bipolar disorder, current episode depressed, severe, without psychotic features: Secondary | ICD-10-CM | POA: Diagnosis not present

## 2023-05-08 DIAGNOSIS — N179 Acute kidney failure, unspecified: Secondary | ICD-10-CM | POA: Diagnosis not present

## 2023-05-08 DIAGNOSIS — J690 Pneumonitis due to inhalation of food and vomit: Secondary | ICD-10-CM | POA: Diagnosis present

## 2023-05-08 DIAGNOSIS — K589 Irritable bowel syndrome without diarrhea: Secondary | ICD-10-CM | POA: Diagnosis present

## 2023-05-08 DIAGNOSIS — Z79890 Hormone replacement therapy (postmenopausal): Secondary | ICD-10-CM

## 2023-05-08 DIAGNOSIS — Z66 Do not resuscitate: Secondary | ICD-10-CM | POA: Diagnosis present

## 2023-05-08 DIAGNOSIS — I4891 Unspecified atrial fibrillation: Secondary | ICD-10-CM

## 2023-05-08 DIAGNOSIS — F32A Depression, unspecified: Secondary | ICD-10-CM | POA: Diagnosis present

## 2023-05-08 DIAGNOSIS — J189 Pneumonia, unspecified organism: Secondary | ICD-10-CM | POA: Diagnosis not present

## 2023-05-08 DIAGNOSIS — I080 Rheumatic disorders of both mitral and aortic valves: Secondary | ICD-10-CM | POA: Diagnosis not present

## 2023-05-08 DIAGNOSIS — E874 Mixed disorder of acid-base balance: Secondary | ICD-10-CM | POA: Diagnosis not present

## 2023-05-08 DIAGNOSIS — R112 Nausea with vomiting, unspecified: Secondary | ICD-10-CM | POA: Diagnosis not present

## 2023-05-08 DIAGNOSIS — F339 Major depressive disorder, recurrent, unspecified: Secondary | ICD-10-CM | POA: Diagnosis not present

## 2023-05-08 DIAGNOSIS — J9601 Acute respiratory failure with hypoxia: Secondary | ICD-10-CM | POA: Diagnosis not present

## 2023-05-08 DIAGNOSIS — K56609 Unspecified intestinal obstruction, unspecified as to partial versus complete obstruction: Secondary | ICD-10-CM | POA: Diagnosis not present

## 2023-05-08 DIAGNOSIS — Z800 Family history of malignant neoplasm of digestive organs: Secondary | ICD-10-CM

## 2023-05-08 DIAGNOSIS — R197 Diarrhea, unspecified: Secondary | ICD-10-CM | POA: Diagnosis not present

## 2023-05-08 DIAGNOSIS — I493 Ventricular premature depolarization: Secondary | ICD-10-CM | POA: Diagnosis present

## 2023-05-08 DIAGNOSIS — H9190 Unspecified hearing loss, unspecified ear: Secondary | ICD-10-CM | POA: Diagnosis present

## 2023-05-08 DIAGNOSIS — L8990 Pressure ulcer of unspecified site, unspecified stage: Secondary | ICD-10-CM | POA: Insufficient documentation

## 2023-05-08 DIAGNOSIS — I878 Other specified disorders of veins: Secondary | ICD-10-CM | POA: Diagnosis present

## 2023-05-08 DIAGNOSIS — J9602 Acute respiratory failure with hypercapnia: Secondary | ICD-10-CM | POA: Diagnosis not present

## 2023-05-08 DIAGNOSIS — E872 Acidosis, unspecified: Secondary | ICD-10-CM | POA: Diagnosis present

## 2023-05-08 DIAGNOSIS — I4892 Unspecified atrial flutter: Secondary | ICD-10-CM | POA: Diagnosis not present

## 2023-05-08 DIAGNOSIS — K9429 Other complications of gastrostomy: Secondary | ICD-10-CM | POA: Diagnosis not present

## 2023-05-08 DIAGNOSIS — Z4682 Encounter for fitting and adjustment of non-vascular catheter: Secondary | ICD-10-CM | POA: Diagnosis not present

## 2023-05-08 DIAGNOSIS — F419 Anxiety disorder, unspecified: Secondary | ICD-10-CM | POA: Diagnosis present

## 2023-05-08 DIAGNOSIS — Z880 Allergy status to penicillin: Secondary | ICD-10-CM

## 2023-05-08 DIAGNOSIS — M7989 Other specified soft tissue disorders: Secondary | ICD-10-CM | POA: Diagnosis not present

## 2023-05-08 DIAGNOSIS — Z79899 Other long term (current) drug therapy: Secondary | ICD-10-CM

## 2023-05-08 DIAGNOSIS — K5669 Other partial intestinal obstruction: Secondary | ICD-10-CM | POA: Diagnosis not present

## 2023-05-08 DIAGNOSIS — R918 Other nonspecific abnormal finding of lung field: Secondary | ICD-10-CM | POA: Diagnosis not present

## 2023-05-08 DIAGNOSIS — R0602 Shortness of breath: Secondary | ICD-10-CM | POA: Diagnosis not present

## 2023-05-08 DIAGNOSIS — K219 Gastro-esophageal reflux disease without esophagitis: Secondary | ICD-10-CM | POA: Diagnosis present

## 2023-05-08 LAB — COMPREHENSIVE METABOLIC PANEL
ALT: 13 U/L (ref 0–44)
AST: 17 U/L (ref 15–41)
Albumin: 4.6 g/dL (ref 3.5–5.0)
Alkaline Phosphatase: 67 U/L (ref 38–126)
Anion gap: 14 (ref 5–15)
BUN: 29 mg/dL — ABNORMAL HIGH (ref 8–23)
CO2: 17 mmol/L — ABNORMAL LOW (ref 22–32)
Calcium: 9.7 mg/dL (ref 8.9–10.3)
Chloride: 102 mmol/L (ref 98–111)
Creatinine, Ser: 1.23 mg/dL — ABNORMAL HIGH (ref 0.44–1.00)
GFR, Estimated: 42 mL/min — ABNORMAL LOW (ref >60)
Glucose, Bld: 170 mg/dL — ABNORMAL HIGH (ref 70–99)
Potassium: 3.6 mmol/L (ref 3.5–5.1)
Sodium: 133 mmol/L — ABNORMAL LOW (ref 135–145)
Total Bilirubin: 0.4 mg/dL (ref 0.0–1.2)
Total Protein: 8.1 g/dL (ref 6.5–8.1)

## 2023-05-08 LAB — LIPASE, BLOOD: Lipase: <10 U/L — ABNORMAL LOW (ref 11–51)

## 2023-05-08 LAB — CBC
HCT: 41.7 % (ref 36.0–46.0)
Hemoglobin: 14.1 g/dL (ref 12.0–15.0)
MCH: 32.5 pg (ref 26.0–34.0)
MCHC: 33.8 g/dL (ref 30.0–36.0)
MCV: 96.1 fL (ref 80.0–100.0)
Platelets: 166 10*3/uL (ref 150–400)
RBC: 4.34 MIL/uL (ref 3.87–5.11)
RDW: 11.9 % (ref 11.5–15.5)
WBC: 9 10*3/uL (ref 4.0–10.5)
nRBC: 0 % (ref 0.0–0.2)

## 2023-05-08 MED ORDER — IOHEXOL 300 MG/ML  SOLN
100.0000 mL | Freq: Once | INTRAMUSCULAR | Status: AC | PRN
  Administered 2023-05-08: 75 mL via INTRAVENOUS

## 2023-05-08 MED ORDER — SODIUM CHLORIDE 0.9 % IV BOLUS
1000.0000 mL | Freq: Once | INTRAVENOUS | Status: AC
  Administered 2023-05-08: 1000 mL via INTRAVENOUS

## 2023-05-08 NOTE — ED Notes (Signed)
 Pt cannot produce urine sample at this time.

## 2023-05-08 NOTE — ED Provider Notes (Incomplete)
 [facility] Emergency Department Provider Note MRN:  [id number]  Arrival date & time: 05/09/23     Chief Complaint   Nausea and Diarrhea   History of Present Illness   Erica [name] is a 89 y.o. year-old female with a history of rheumatoid arthritis, bowel obstruction, bipolar disorder presenting to the ED with chief complaint of nausea and diarrhea.  Nausea vomiting and diarrhea for the past 2 days.  Denies abdominal pain.  Review of Systems  A thorough review of systems was obtained and all systems are negative except as noted in the HPI and PMH.   Patient's Health History    Past Medical History:  Diagnosis Date   Anxiety    Arthritis    Bipolar disorder (HCC)    Depression    Hyperlipidemia    Hypothyroidism    Hypothyroidism    Irritable bowel syndrome    RA (rheumatoid arthritis) (HCC)     Past Surgical History:  Procedure Laterality Date   ABDOMINAL HYSTERECTOMY     Bone Spurs  08/07/13   Right Shoulder   COLONOSCOPY     COLONOSCOPY N/A 05/08/2012   Procedure: COLONOSCOPY;  Surgeon: Malissa Hippo, MD;  Location: [facility];  Service: Endoscopy;  Laterality: N/A;  730   COLOSTOMY     ESOPHAGOGASTRODUODENOSCOPY N/A 11/04/2013   Procedure: ESOPHAGOGASTRODUODENOSCOPY (EGD);  Surgeon: Malissa Hippo, MD;  Location: [facility];  Service: Endoscopy;  Laterality: N/A;  730   EYE SURGERY  cataract x 2   MALONEY DILATION N/A 11/04/2013   Procedure: MALONEY DILATION;  Surgeon: Malissa Hippo, MD;  Location: [facility];  Service: Endoscopy;  Laterality: N/A;   UPPER GASTROINTESTINAL ENDOSCOPY      Family History  Problem Relation Age of Onset   Colon cancer Mother    Bipolar disorder Mother    Anxiety disorder Mother    Atrial fibrillation Son    Depression Maternal Grandmother     Social History   Socioeconomic History   Marital status: Widowed    Spouse name: Not on file   Number of children: 3   Years of education: Not  on file   Highest education level: Not on file  Occupational History   Not on file  Tobacco Use   Smoking status: Former    Current packs/day: 0.00    Average packs/day: 1.5 packs/day for 27.0 years (40.5 ttl pk-yrs)    Types: Cigarettes    Start date: 04/18/1954    Quit date: 04/18/1981    Years since quitting: 42.0   Smokeless tobacco: Former    Quit date: 11/27/1980  Vaping Use   Vaping status: Never Used  Substance and Sexual Activity   Alcohol use: Yes    Alcohol/week: 1.0 standard drink of alcohol    Types: 1 Glasses of wine per week   Drug use: No   Sexual activity: Not Currently    Partners: Male    Birth control/protection: Post-menopausal, Surgical    Comment: hyst  Other Topics Concern   Not on file  Social History Narrative   Not on file   Social Drivers of Health   Financial Resource Strain: Low Risk  (02/13/2020)   Received from [facility], [facility]   Overall Financial Resource Strain (CARDIA)    Difficulty of Paying Living Expenses: Not hard at all  Food Insecurity: Not on file  Transportation Needs: Not on file  Physical Activity: Not on file  Stress:  Stress Concern Present (02/13/2020)   Received from [facility], [facility] - Occupational Stress Questionnaire    Feeling of Stress : To some extent  Social Connections: Unknown (07/27/2021)   Received from [facility], [facility]   Social Network    Social Network: Not on file  Intimate Partner Violence: Unknown (06/18/2021)   Received from [facility], [facility]   HITS    Physically Hurt: Not on file    Insult or Talk Down To: Not on file    Threaten Physical Harm: Not on file    Scream or Curse: Not on file     Physical Exam   Vitals:   05/09/23 0615 05/09/23 0625  BP: (!) 103/56 (!) 158/97  Pulse:    Resp:    Temp:    SpO2: 91% 96%    CONSTITUTIONAL: Chronically ill-appearing, NAD NEURO/PSYCH:  Alert and oriented x 3, no focal  deficits EYES:  eyes equal and reactive ENT/NECK:  no LAD, no JVD CARDIO: Regular rate, well-perfused, normal S1 and S2 PULM:  CTAB no wheezing or rhonchi GI/GU:  non-distended, non-tender MSK/SPINE:  No gross deformities, no edema SKIN:  no rash, atraumatic   *Additional and/or pertinent findings included in MDM below  Diagnostic and Interventional Summary    EKG Interpretation Date/Time:    Ventricular Rate:    PR Interval:    QRS Duration:    QT Interval:    QTC Calculation:   R Axis:      Text Interpretation:         Labs Reviewed  LIPASE, BLOOD - Abnormal; Notable for the following components:      Result Value   Lipase <10 (*)    All other components within normal limits  COMPREHENSIVE METABOLIC PANEL - Abnormal; Notable for the following components:   Sodium 133 (*)    CO2 17 (*)    Glucose, Bld 170 (*)    BUN 29 (*)    Creatinine, Ser 1.23 (*)    GFR, Estimated 42 (*)    All other components within normal limits  BASIC METABOLIC PANEL - Abnormal; Notable for the following components:   CO2 21 (*)    Glucose, Bld 160 (*)    BUN 27 (*)    Creatinine, Ser 1.14 (*)    Calcium 8.6 (*)    GFR, Estimated 46 (*)    All other components within normal limits  CBC  CBC WITH DIFFERENTIAL/PLATELET  URINALYSIS, ROUTINE W REFLEX MICROSCOPIC  BLOOD GAS, VENOUS    DG Abdomen 1 View  Final Result    CT ABDOMEN PELVIS W CONTRAST  Final Result    DG Chest Portable 1 View    (Results Pending)  DG Abdomen 1 View    (Results Pending)    Medications  prochlorperazine (COMPAZINE) injection 5 mg (5 mg Intravenous Given 05/09/23 0158)  lactated ringers infusion ( Intravenous New Bag/Given 05/09/23 0215)  lactated ringers infusion (has no administration in time range)  propofol (DIPRIVAN) 1000 MG/100ML infusion (has no administration in time range)  norepinephrine (LEVOPHED) 4mg  in (0.016 mg/mL) premix infusion (0 mcg/min Intravenous Hold 05/09/23 0632)  propofol  (DIPRIVAN) 1000 MG/100ML infusion (5 mcg/kg/min  53.1 kg Intravenous Restarted 05/09/23 0631)  sodium chloride 0.9 % bolus 1,000 mL (0 mLs Intravenous Stopped 05/08/23 2356)  iohexol (OMNIPAQUE) 300 MG/ML solution 100 mL (75 mLs Intravenous Contrast Given 05/08/23 2344)  Benzocaine (HURRCAINE) 20 % mouth spray (  1 Application Mouth/Throat Given 05/09/23 0215)  lidocaine (XYLOCAINE) 2 % jelly 1 Application (1 Application Other Given 05/09/23 0215)  levofloxacin (LEVAQUIN) IVPB 750 mg (0 mg Intravenous Stopped 05/09/23 0602)  etomidate (AMIDATE) injection 20 mg (20 mg Intravenous Given 05/09/23 0607)  rocuronium (ZEMURON) injection 80 mg (80 mg Intravenous Given 05/09/23 4098)     Procedures  /  Critical Care .Critical Care  Performed by: Sabas Sous, MD Authorized by: Sabas Sous, MD   Critical care provider statement:    Critical care time (minutes):  88   Critical care was necessary to treat or prevent imminent or life-threatening deterioration of the following conditions: SBO.   Critical care was time spent personally by me on the following activities:  Development of treatment plan with patient or surrogate, discussions with consultants, evaluation of patient's response to treatment, examination of patient, ordering and review of laboratory studies, ordering and review of radiographic studies, ordering and performing treatments and interventions, pulse oximetry, re-evaluation of patient's condition and review of old charts Procedure Name: Intubation Date/Time: 05/09/2023 6:27 AM  Performed by: Sabas Sous, MDPre-anesthesia Checklist: Patient identified, Patient being monitored, Emergency Drugs available, Timeout performed and Suction available Oxygen Delivery Method: Non-rebreather mask Preoxygenation: Pre-oxygenation with 100% oxygen Induction Type: Rapid sequence Ventilation: Mask ventilation without difficulty Laryngoscope Size: Glidescope and 3 Grade View: Grade I Tube size:  7.5 mm Number of attempts: 1 Airway Equipment and Method: Rigid stylet Placement Confirmation: ETT inserted through vocal cords under direct vision, CO2 detector and Breath sounds checked- equal and bilateral Secured at: 22 cm      ED Course and Medical Decision Making  Initial Impression and Ddx Patient is tachycardic with soft blood pressure, no fever, abdomen is mildly distended but soft, largely nontender.  Having some diarrhea but still concern for possible bowel obstruction or partial obstruction given her history.  Awaiting CT.  Providing fluids.  Past medical/surgical history that increases complexity of ED encounter:  SBO  Interpretation of Diagnostics I personally reviewed the laboratory evaluation and my interpretation is as follows:  Acidosis, mild creatinine elevation  CT reveals evidence of SBO  Patient Reassessment and Ultimate Disposition/Management     Case discussed with Dr. Sheliah Hatch of general surgery, plan is for hospitalist admission.  Placing NG tube.  6:30 AM update: There was great difficulty passing the NG tube for this patient.  Based on the x-ray and the CT findings I suspect the NG tube was unable to pass through patient's hiatal hernia and so we were unable to decompress the patient's stomach, which was quite large on CT.  On one of the NG tube attempts, I am told by nursing staff the patient had an aspiration event.  She was then requiring 2 to 4 L of nasal cannula oxygen to maintain her saturations.  Providing her with a dose of prophylactic antibiotics given this aspiration event.  I discussed these changes with Dr. Sheliah Hatch of general surgery, with the plan to still admit to medicine and have possibly interventional radiology pass an NG tube first thing in the morning.  Unfortunately patient seemed to continue to decompensate, with her growing abdomen she is taking progressively smaller and more rapid breaths.  Despite 4 to 6 L nasal cannula still with  oxygen saturations in the upper 80s.  Becoming more and more acidotic, suspect a lactic acidosis related to her significant abdominal distention.  Becoming more more tachycardic up to 140.  Patient is technically DNR/DNI however intubation  is definitely a consideration and likely the best course of action for the patient at this time.  Discussed with Dr. Wilkie Aye of [facility] emergency department who accepts patient for transfer to the [facility] emergency department.  I explained the situation to patient's healthcare power of attorney (her son).  They had not really discussed intubation much in terms of goals of care.  Son seem to think it was generally reasonable to intubate briefly to facilitate her recovery at this time.  Overall I was concerned that without intubation patient would not be able to have proper decompression of the stomach and she would continue to deteriorate and I felt this was much more of a mortality risk than the more preemptive intubation that would hopefully be brief.  And so with shared decision making we decided to intubate as described above.  During the paralytic effect of RSI we were able to place an NG tube and now patient is having good rapid decompression of the stomach.  Awaiting transfer to [facility] emergency department, I spoke with Dr. Allena Katz of the intensivist service who will see the patient for admission.  Will update general surgery as well.  Patient management required discussion with the following services or consulting groups:  Hospitalist Service and General/Trauma Surgery  Complexity of Problems Addressed Acute illness or injury that poses threat of life of bodily function  Additional Data Reviewed and Analyzed Further history obtained from: Further history from spouse/family member  Additional Factors Impacting ED Encounter Risk Consideration of hospitalization  Elmer Sow. Pilar Plate, MD [facility] Emergency Medicine [facility]  Health [email or web address]  Final Clinical Impressions(s) / ED Diagnoses     ICD-10-CM   1. SBO (small bowel obstruction) (HCC)  K56.609     2. Acute respiratory failure with hypoxia (HCC)  J96.01       ED Discharge Orders     None        Discharge Instructions Discussed with and Provided to Patient:   Discharge Instructions   None      Sabas Sous, MD 05/09/23 2952    Sabas Sous, MD 05/09/23 [phone]

## 2023-05-08 NOTE — ED Triage Notes (Signed)
 Pt also reports so lower abd bloating but denies pain.

## 2023-05-08 NOTE — ED Triage Notes (Signed)
 Pt presents via EMS c/o N/V/D since yesterday. Pt denies abd pain. Pt presents from [facility] SN[facility]. DNR in packet received upon arrival.   Pt A&O x4.

## 2023-05-09 ENCOUNTER — Encounter (HOSPITAL_COMMUNITY): Payer: Self-pay | Admitting: Pulmonary Disease

## 2023-05-09 ENCOUNTER — Emergency Department (HOSPITAL_BASED_OUTPATIENT_CLINIC_OR_DEPARTMENT_OTHER): Payer: Medicare Other | Admitting: Radiology

## 2023-05-09 ENCOUNTER — Emergency Department (HOSPITAL_BASED_OUTPATIENT_CLINIC_OR_DEPARTMENT_OTHER): Payer: Medicare Other

## 2023-05-09 DIAGNOSIS — E785 Hyperlipidemia, unspecified: Secondary | ICD-10-CM | POA: Diagnosis present

## 2023-05-09 DIAGNOSIS — R112 Nausea with vomiting, unspecified: Secondary | ICD-10-CM | POA: Diagnosis not present

## 2023-05-09 DIAGNOSIS — K5669 Other partial intestinal obstruction: Secondary | ICD-10-CM | POA: Diagnosis not present

## 2023-05-09 DIAGNOSIS — K56609 Unspecified intestinal obstruction, unspecified as to partial versus complete obstruction: Secondary | ICD-10-CM | POA: Diagnosis present

## 2023-05-09 DIAGNOSIS — J690 Pneumonitis due to inhalation of food and vomit: Secondary | ICD-10-CM | POA: Diagnosis present

## 2023-05-09 DIAGNOSIS — R0602 Shortness of breath: Secondary | ICD-10-CM | POA: Diagnosis not present

## 2023-05-09 DIAGNOSIS — K449 Diaphragmatic hernia without obstruction or gangrene: Secondary | ICD-10-CM | POA: Diagnosis not present

## 2023-05-09 DIAGNOSIS — I480 Paroxysmal atrial fibrillation: Secondary | ICD-10-CM | POA: Diagnosis present

## 2023-05-09 DIAGNOSIS — Z1152 Encounter for screening for COVID-19: Secondary | ICD-10-CM | POA: Diagnosis not present

## 2023-05-09 DIAGNOSIS — I011 Acute rheumatic endocarditis: Secondary | ICD-10-CM | POA: Diagnosis not present

## 2023-05-09 DIAGNOSIS — E874 Mixed disorder of acid-base balance: Secondary | ICD-10-CM | POA: Diagnosis present

## 2023-05-09 DIAGNOSIS — R197 Diarrhea, unspecified: Secondary | ICD-10-CM | POA: Diagnosis not present

## 2023-05-09 DIAGNOSIS — R140 Abdominal distension (gaseous): Secondary | ICD-10-CM | POA: Diagnosis not present

## 2023-05-09 DIAGNOSIS — J9602 Acute respiratory failure with hypercapnia: Secondary | ICD-10-CM | POA: Diagnosis not present

## 2023-05-09 DIAGNOSIS — D696 Thrombocytopenia, unspecified: Secondary | ICD-10-CM | POA: Diagnosis present

## 2023-05-09 DIAGNOSIS — E860 Dehydration: Secondary | ICD-10-CM | POA: Diagnosis present

## 2023-05-09 DIAGNOSIS — J984 Other disorders of lung: Secondary | ICD-10-CM | POA: Diagnosis not present

## 2023-05-09 DIAGNOSIS — J9601 Acute respiratory failure with hypoxia: Secondary | ICD-10-CM

## 2023-05-09 DIAGNOSIS — N179 Acute kidney failure, unspecified: Secondary | ICD-10-CM | POA: Diagnosis present

## 2023-05-09 DIAGNOSIS — J189 Pneumonia, unspecified organism: Secondary | ICD-10-CM | POA: Diagnosis not present

## 2023-05-09 DIAGNOSIS — M7989 Other specified soft tissue disorders: Secondary | ICD-10-CM | POA: Diagnosis not present

## 2023-05-09 DIAGNOSIS — E872 Acidosis, unspecified: Secondary | ICD-10-CM | POA: Diagnosis present

## 2023-05-09 DIAGNOSIS — L89151 Pressure ulcer of sacral region, stage 1: Secondary | ICD-10-CM | POA: Diagnosis present

## 2023-05-09 DIAGNOSIS — I4892 Unspecified atrial flutter: Secondary | ICD-10-CM | POA: Diagnosis present

## 2023-05-09 DIAGNOSIS — F314 Bipolar disorder, current episode depressed, severe, without psychotic features: Secondary | ICD-10-CM | POA: Diagnosis present

## 2023-05-09 DIAGNOSIS — I5031 Acute diastolic (congestive) heart failure: Secondary | ICD-10-CM | POA: Diagnosis not present

## 2023-05-09 DIAGNOSIS — E8809 Other disorders of plasma-protein metabolism, not elsewhere classified: Secondary | ICD-10-CM | POA: Diagnosis present

## 2023-05-09 DIAGNOSIS — R918 Other nonspecific abnormal finding of lung field: Secondary | ICD-10-CM | POA: Diagnosis not present

## 2023-05-09 DIAGNOSIS — I110 Hypertensive heart disease with heart failure: Secondary | ICD-10-CM | POA: Diagnosis present

## 2023-05-09 DIAGNOSIS — I4891 Unspecified atrial fibrillation: Secondary | ICD-10-CM | POA: Diagnosis not present

## 2023-05-09 DIAGNOSIS — K9429 Other complications of gastrostomy: Secondary | ICD-10-CM | POA: Diagnosis not present

## 2023-05-09 DIAGNOSIS — I080 Rheumatic disorders of both mitral and aortic valves: Secondary | ICD-10-CM | POA: Diagnosis present

## 2023-05-09 DIAGNOSIS — Z933 Colostomy status: Secondary | ICD-10-CM | POA: Diagnosis not present

## 2023-05-09 DIAGNOSIS — K56600 Partial intestinal obstruction, unspecified as to cause: Secondary | ICD-10-CM | POA: Diagnosis not present

## 2023-05-09 DIAGNOSIS — Z66 Do not resuscitate: Secondary | ICD-10-CM | POA: Diagnosis not present

## 2023-05-09 DIAGNOSIS — Z4682 Encounter for fitting and adjustment of non-vascular catheter: Secondary | ICD-10-CM | POA: Diagnosis not present

## 2023-05-09 DIAGNOSIS — M0530 Rheumatoid heart disease with rheumatoid arthritis of unspecified site: Secondary | ICD-10-CM | POA: Diagnosis present

## 2023-05-09 DIAGNOSIS — E039 Hypothyroidism, unspecified: Secondary | ICD-10-CM | POA: Diagnosis not present

## 2023-05-09 LAB — URINALYSIS, ROUTINE W REFLEX MICROSCOPIC
Bilirubin Urine: NEGATIVE
Glucose, UA: NEGATIVE mg/dL
Hgb urine dipstick: NEGATIVE
Ketones, ur: NEGATIVE mg/dL
Leukocytes,Ua: NEGATIVE
Nitrite: NEGATIVE
Protein, ur: 30 mg/dL — AB
Specific Gravity, Urine: 1.041 — ABNORMAL HIGH (ref 1.005–1.030)
pH: 5 (ref 5.0–8.0)

## 2023-05-09 LAB — CBC WITH DIFFERENTIAL/PLATELET
Abs Immature Granulocytes: 0.04 10*3/uL (ref 0.00–0.07)
Abs Immature Granulocytes: 0.03 10*3/uL (ref 0.00–0.07)
Basophils Absolute: 0 10*3/uL (ref 0.0–0.1)
Basophils Absolute: 0 10*3/uL (ref 0.0–0.1)
Basophils Relative: 0 %
Basophils Relative: 0 %
Eosinophils Absolute: 0.1 10*3/uL (ref 0.0–0.5)
Eosinophils Absolute: 0 10*3/uL (ref 0.0–0.5)
Eosinophils Relative: 1 %
Eosinophils Relative: 0 %
HCT: 39.2 % (ref 36.0–46.0)
HCT: 45.7 % (ref 36.0–46.0)
Hemoglobin: 13 g/dL (ref 12.0–15.0)
Hemoglobin: 14.7 g/dL (ref 12.0–15.0)
Immature Granulocytes: 1 %
Immature Granulocytes: 1 %
Lymphocytes Relative: 18 %
Lymphocytes Relative: 18 %
Lymphs Abs: 1.2 10*3/uL (ref 0.7–4.0)
Lymphs Abs: 0.9 10*3/uL (ref 0.7–4.0)
MCH: 32.3 pg (ref 26.0–34.0)
MCH: 32.1 pg (ref 26.0–34.0)
MCHC: 33.2 g/dL (ref 30.0–36.0)
MCHC: 32.2 g/dL (ref 30.0–36.0)
MCV: 97.5 fL (ref 80.0–100.0)
MCV: 99.8 fL (ref 80.0–100.0)
Monocytes Absolute: 0.9 10*3/uL (ref 0.1–1.0)
Monocytes Absolute: 0.9 10*3/uL (ref 0.1–1.0)
Monocytes Relative: 13 %
Monocytes Relative: 20 %
Neutro Abs: 4.5 10*3/uL (ref 1.7–7.7)
Neutro Abs: 3 10*3/uL (ref 1.7–7.7)
Neutrophils Relative %: 67 %
Neutrophils Relative %: 61 %
Platelets: 155 10*3/uL (ref 150–400)
Platelets: 128 10*3/uL — ABNORMAL LOW (ref 150–400)
RBC: 4.02 MIL/uL (ref 3.87–5.11)
RBC: 4.58 MIL/uL (ref 3.87–5.11)
RDW: 11.9 % (ref 11.5–15.5)
RDW: 11.9 % (ref 11.5–15.5)
WBC: 6.7 10*3/uL (ref 4.0–10.5)
WBC: 4.8 10*3/uL (ref 4.0–10.5)
nRBC: 0 % (ref 0.0–0.2)
nRBC: 0 % (ref 0.0–0.2)

## 2023-05-09 LAB — BASIC METABOLIC PANEL
Anion gap: 11 (ref 5–15)
BUN: 27 mg/dL — ABNORMAL HIGH (ref 8–23)
CO2: 21 mmol/L — ABNORMAL LOW (ref 22–32)
Calcium: 8.6 mg/dL — ABNORMAL LOW (ref 8.9–10.3)
Chloride: 103 mmol/L (ref 98–111)
Creatinine, Ser: 1.14 mg/dL — ABNORMAL HIGH (ref 0.44–1.00)
GFR, Estimated: 46 mL/min — ABNORMAL LOW (ref >60)
Glucose, Bld: 160 mg/dL — ABNORMAL HIGH (ref 70–99)
Potassium: 3.8 mmol/L (ref 3.5–5.1)
Sodium: 135 mmol/L (ref 135–145)

## 2023-05-09 LAB — COMPREHENSIVE METABOLIC PANEL
ALT: 14 U/L (ref 0–44)
AST: 26 U/L (ref 15–41)
Albumin: 3.3 g/dL — ABNORMAL LOW (ref 3.5–5.0)
Alkaline Phosphatase: 50 U/L (ref 38–126)
Anion gap: 10 (ref 5–15)
BUN: 25 mg/dL — ABNORMAL HIGH (ref 8–23)
CO2: 18 mmol/L — ABNORMAL LOW (ref 22–32)
Calcium: 8 mg/dL — ABNORMAL LOW (ref 8.9–10.3)
Chloride: 108 mmol/L (ref 98–111)
Creatinine, Ser: 1.06 mg/dL — ABNORMAL HIGH (ref 0.44–1.00)
GFR, Estimated: 51 mL/min — ABNORMAL LOW (ref >60)
Glucose, Bld: 134 mg/dL — ABNORMAL HIGH (ref 70–99)
Potassium: 3.2 mmol/L — ABNORMAL LOW (ref 3.5–5.1)
Sodium: 136 mmol/L (ref 135–145)
Total Bilirubin: 0.7 mg/dL (ref 0.0–1.2)
Total Protein: 7 g/dL (ref 6.5–8.1)

## 2023-05-09 LAB — I-STAT ARTERIAL BLOOD GAS, ED
Acid-base deficit: 9 mmol/L — ABNORMAL HIGH (ref 0.0–2.0)
Bicarbonate: 19.2 mmol/L — ABNORMAL LOW (ref 20.0–28.0)
Calcium, Ion: 1.27 mmol/L (ref 1.15–1.40)
HCT: 43 % (ref 36.0–46.0)
Hemoglobin: 14.6 g/dL (ref 12.0–15.0)
O2 Saturation: 95 %
Patient temperature: 100.3
Potassium: 3.2 mmol/L — ABNORMAL LOW (ref 3.5–5.1)
Sodium: 137 mmol/L (ref 135–145)
TCO2: 21 mmol/L — ABNORMAL LOW (ref 22–32)
pCO2 arterial: 51.4 mm[Hg] — ABNORMAL HIGH (ref 32–48)
pH, Arterial: 7.185 — CL (ref 7.35–7.45)
pO2, Arterial: 101 mm[Hg] (ref 83–108)

## 2023-05-09 LAB — POCT I-STAT 7, (LYTES, BLD GAS, ICA,H+H)
Acid-base deficit: 9 mmol/L — ABNORMAL HIGH (ref 0.0–2.0)
Bicarbonate: 16.7 mmol/L — ABNORMAL LOW (ref 20.0–28.0)
Calcium, Ion: 1.14 mmol/L — ABNORMAL LOW (ref 1.15–1.40)
HCT: 34 % — ABNORMAL LOW (ref 36.0–46.0)
Hemoglobin: 11.6 g/dL — ABNORMAL LOW (ref 12.0–15.0)
O2 Saturation: 89 %
Patient temperature: 37.4
Potassium: 4.5 mmol/L (ref 3.5–5.1)
Sodium: 134 mmol/L — ABNORMAL LOW (ref 135–145)
TCO2: 18 mmol/L — ABNORMAL LOW (ref 22–32)
pCO2 arterial: 33.4 mm[Hg] (ref 32–48)
pH, Arterial: 7.309 — ABNORMAL LOW (ref 7.35–7.45)
pO2, Arterial: 63 mm[Hg] — ABNORMAL LOW (ref 83–108)

## 2023-05-09 LAB — CBG MONITORING, ED: Glucose-Capillary: 145 mg/dL — ABNORMAL HIGH (ref 70–99)

## 2023-05-09 LAB — GLUCOSE, CAPILLARY
Glucose-Capillary: 100 mg/dL — ABNORMAL HIGH (ref 70–99)
Glucose-Capillary: 104 mg/dL — ABNORMAL HIGH (ref 70–99)
Glucose-Capillary: 114 mg/dL — ABNORMAL HIGH (ref 70–99)
Glucose-Capillary: 136 mg/dL — ABNORMAL HIGH (ref 70–99)
Glucose-Capillary: 94 mg/dL (ref 70–99)

## 2023-05-09 LAB — I-STAT CG4 LACTIC ACID, ED: Lactic Acid, Venous: 3 mmol/L (ref 0.5–1.9)

## 2023-05-09 LAB — LACTIC ACID, PLASMA: Lactic Acid, Venous: 3.6 mmol/L (ref 0.5–1.9)

## 2023-05-09 LAB — MAGNESIUM: Magnesium: 1.7 mg/dL (ref 1.7–2.4)

## 2023-05-09 LAB — MRSA NEXT GEN BY PCR, NASAL: MRSA by PCR Next Gen: NOT DETECTED

## 2023-05-09 LAB — PHOSPHORUS: Phosphorus: 3.9 mg/dL (ref 2.5–4.6)

## 2023-05-09 LAB — PROTIME-INR
INR: 1.2 (ref 0.8–1.2)
Prothrombin Time: 15.4 s — ABNORMAL HIGH (ref 11.4–15.2)

## 2023-05-09 MED ORDER — CHLORHEXIDINE GLUCONATE CLOTH 2 % EX PADS
6.0000 | MEDICATED_PAD | Freq: Every day | CUTANEOUS | Status: DC
  Administered 2023-05-09 – 2023-05-13 (×5): 6 via TOPICAL

## 2023-05-09 MED ORDER — ACETAMINOPHEN 10 MG/ML IV SOLN
1000.0000 mg | Freq: Four times a day (QID) | INTRAVENOUS | Status: AC
  Administered 2023-05-09 – 2023-05-10 (×4): 1000 mg via INTRAVENOUS
  Filled 2023-05-09 (×4): qty 100

## 2023-05-09 MED ORDER — LACTATED RINGERS IV SOLN
INTRAVENOUS | Status: DC
Start: 2023-05-09 — End: 2023-05-09

## 2023-05-09 MED ORDER — PROCHLORPERAZINE EDISYLATE 10 MG/2ML IJ SOLN
5.0000 mg | INTRAMUSCULAR | Status: DC | PRN
  Administered 2023-05-09: 5 mg via INTRAVENOUS
  Filled 2023-05-09: qty 2

## 2023-05-09 MED ORDER — FENTANYL BOLUS VIA INFUSION
25.0000 ug | INTRAVENOUS | Status: DC | PRN
  Administered 2023-05-09: 25 ug via INTRAVENOUS

## 2023-05-09 MED ORDER — AMIODARONE HCL IN DEXTROSE 360-4.14 MG/200ML-% IV SOLN
60.0000 mg/h | INTRAVENOUS | Status: AC
  Administered 2023-05-09: 60 mg/h via INTRAVENOUS
  Filled 2023-05-09: qty 200

## 2023-05-09 MED ORDER — DOCUSATE SODIUM 100 MG PO CAPS: 100.0000 mg | ORAL_CAPSULE | Freq: Two times a day (BID) | ORAL | Status: DC | PRN

## 2023-05-09 MED ORDER — PROPOFOL 1000 MG/100ML IV EMUL
INTRAVENOUS | Status: AC
  Filled 2023-05-09: qty 100

## 2023-05-09 MED ORDER — POLYETHYLENE GLYCOL 3350 17 G PO PACK: 17.0000 g | PACK | Freq: Every day | ORAL | Status: DC | PRN

## 2023-05-09 MED ORDER — AMIODARONE LOAD VIA INFUSION
150.0000 mg | Freq: Once | INTRAVENOUS | Status: AC
  Administered 2023-05-09: 150 mg via INTRAVENOUS
  Filled 2023-05-09: qty 83.34

## 2023-05-09 MED ORDER — BENZOCAINE 20 % MT AERO
INHALATION_SPRAY | Freq: Once | OROMUCOSAL | Status: AC
  Administered 2023-05-09: 1 via OROMUCOSAL
  Filled 2023-05-09: qty 57

## 2023-05-09 MED ORDER — PANTOPRAZOLE SODIUM 40 MG IV SOLR
40.0000 mg | Freq: Every day | INTRAVENOUS | Status: DC
  Administered 2023-05-09 – 2023-05-14 (×6): 40 mg via INTRAVENOUS
  Filled 2023-05-09 (×6): qty 10

## 2023-05-09 MED ORDER — PROPOFOL 1000 MG/100ML IV EMUL
5.0000 ug/kg/min | INTRAVENOUS | Status: DC
Start: 2023-05-09 — End: 2023-05-10
  Administered 2023-05-09: 10 ug/kg/min via INTRAVENOUS

## 2023-05-09 MED ORDER — LACTATED RINGERS IV SOLN: INTRAVENOUS | Status: DC

## 2023-05-09 MED ORDER — METRONIDAZOLE 500 MG/100ML IV SOLN
500.0000 mg | Freq: Two times a day (BID) | INTRAVENOUS | Status: DC
  Administered 2023-05-09 – 2023-05-13 (×9): 500 mg via INTRAVENOUS
  Filled 2023-05-09 (×9): qty 100

## 2023-05-09 MED ORDER — ORAL CARE MOUTH RINSE
15.0000 mL | OROMUCOSAL | Status: DC
  Administered 2023-05-09 – 2023-05-17 (×30): 15 mL via OROMUCOSAL

## 2023-05-09 MED ORDER — ROCURONIUM BROMIDE 10 MG/ML (PF) SYRINGE
80.0000 mg | PREFILLED_SYRINGE | Freq: Once | INTRAVENOUS | Status: AC
  Administered 2023-05-09: 80 mg via INTRAVENOUS
  Filled 2023-05-09: qty 10

## 2023-05-09 MED ORDER — LIDOCAINE HCL URETHRAL/MUCOSAL 2 % EX GEL
1.0000 | Freq: Once | CUTANEOUS | Status: AC
Start: 2023-05-09 — End: 2023-05-09
  Administered 2023-05-09: 1
  Filled 2023-05-09: qty 11

## 2023-05-09 MED ORDER — SODIUM CHLORIDE 0.9 % IV SOLN
2.0000 g | INTRAVENOUS | Status: AC
  Administered 2023-05-09 – 2023-05-13 (×5): 2 g via INTRAVENOUS
  Filled 2023-05-09 (×5): qty 20

## 2023-05-09 MED ORDER — LABETALOL HCL 5 MG/ML IV SOLN
10.0000 mg | Freq: Once | INTRAVENOUS | Status: AC
  Administered 2023-05-09: 10 mg via INTRAVENOUS

## 2023-05-09 MED ORDER — FENTANYL CITRATE PF 50 MCG/ML IJ SOSY
25.0000 ug | PREFILLED_SYRINGE | INTRAMUSCULAR | Status: DC | PRN
  Administered 2023-05-09: 25 ug via INTRAVENOUS
  Filled 2023-05-09: qty 1

## 2023-05-09 MED ORDER — MAGNESIUM SULFATE 2 GM/50ML IV SOLN
2.0000 g | Freq: Once | INTRAVENOUS | Status: AC
  Administered 2023-05-09: 2 g via INTRAVENOUS
  Filled 2023-05-09: qty 50

## 2023-05-09 MED ORDER — FENTANYL 2500MCG IN NS 250ML (10MCG/ML) PREMIX INFUSION
25.0000 ug/h | INTRAVENOUS | Status: DC
  Administered 2023-05-09: 25 ug/h via INTRAVENOUS
  Filled 2023-05-09: qty 250

## 2023-05-09 MED ORDER — ENOXAPARIN SODIUM 30 MG/0.3ML IJ SOSY
30.0000 mg | PREFILLED_SYRINGE | INTRAMUSCULAR | Status: DC
  Administered 2023-05-09 – 2023-05-11 (×3): 30 mg via SUBCUTANEOUS
  Filled 2023-05-09 (×3): qty 0.3

## 2023-05-09 MED ORDER — ETOMIDATE 2 MG/ML IV SOLN
20.0000 mg | Freq: Once | INTRAVENOUS | Status: AC
  Administered 2023-05-09: 20 mg via INTRAVENOUS
  Filled 2023-05-09: qty 10

## 2023-05-09 MED ORDER — POTASSIUM CHLORIDE 10 MEQ/100ML IV SOLN
10.0000 meq | INTRAVENOUS | Status: AC
  Administered 2023-05-09 (×6): 10 meq via INTRAVENOUS
  Filled 2023-05-09 (×6): qty 100

## 2023-05-09 MED ORDER — INSULIN ASPART 100 UNIT/ML IJ SOLN
0.0000 [IU] | INTRAMUSCULAR | Status: DC
  Administered 2023-05-14 – 2023-05-17 (×3): 1 [IU] via SUBCUTANEOUS

## 2023-05-09 MED ORDER — LACTATED RINGERS IV BOLUS
1000.0000 mL | Freq: Once | INTRAVENOUS | Status: AC
  Administered 2023-05-09: 1000 mL via INTRAVENOUS

## 2023-05-09 MED ORDER — AMIODARONE HCL IN DEXTROSE 360-4.14 MG/200ML-% IV SOLN
30.0000 mg/h | INTRAVENOUS | Status: DC
  Administered 2023-05-10: 30 mg/h via INTRAVENOUS
  Administered 2023-05-10 – 2023-05-11 (×5): 60 mg/h via INTRAVENOUS
  Administered 2023-05-11 – 2023-05-13 (×6): 30 mg/h via INTRAVENOUS
  Filled 2023-05-09 (×13): qty 200

## 2023-05-09 MED ORDER — AMIODARONE HCL IN DEXTROSE 360-4.14 MG/200ML-% IV SOLN
INTRAVENOUS | Status: AC
Start: 2023-05-09 — End: 2023-05-09
  Filled 2023-05-09: qty 200

## 2023-05-09 MED ORDER — PROPOFOL 1000 MG/100ML IV EMUL
5.0000 ug/kg/min | INTRAVENOUS | Status: DC
  Administered 2023-05-09: 5 ug/kg/min via INTRAVENOUS

## 2023-05-09 MED ORDER — LEVOFLOXACIN IN D5W 750 MG/150ML IV SOLN
750.0000 mg | Freq: Once | INTRAVENOUS | Status: AC
  Administered 2023-05-09: 750 mg via INTRAVENOUS
  Filled 2023-05-09: qty 150

## 2023-05-09 MED ORDER — NOREPINEPHRINE 4 MG/250ML-% IV SOLN
0.0000 ug/min | INTRAVENOUS | Status: DC
  Administered 2023-05-09: 2 ug/min via INTRAVENOUS
  Administered 2023-05-09: 10 ug/min via INTRAVENOUS
  Administered 2023-05-10: 4 ug/min via INTRAVENOUS
  Filled 2023-05-09 (×4): qty 250

## 2023-05-09 MED ORDER — ORAL CARE MOUTH RINSE: 15.0000 mL | OROMUCOSAL | Status: DC | PRN

## 2023-05-09 NOTE — Consult Note (Signed)
 Reason for Consult: Small bowel obstruction Referring Physician: Pilar Plate, EDP  Erica [name] is an 89 y.o. female.  HPI: This is a 89 year old female with a history of rheumatoid arthritis, previous SBO, bipolar disorder who presented to [facility] with nausea, vomiting, and diarrhea.  CT abd/ pelvis revealed a distended stomach and small bowel with some decompressed small bowel distally.  She has a large fluid-filled hiatal hernia.  The findings were consistent with a partial SBO.  As nursing was placing a NG tube, the patient aspirated and required intubation.  The most recent abd x-ray shows the NG tube in the stomach below the diaphragm.  She was transferred to [facility] to the CCM service.  Past Medical History:  Diagnosis Date   Anxiety    Arthritis    Bipolar disorder (HCC)    Depression    Hyperlipidemia    Hypothyroidism    Hypothyroidism    Irritable bowel syndrome    RA (rheumatoid arthritis) (HCC)     Past Surgical History:  Procedure Laterality Date   ABDOMINAL HYSTERECTOMY     Bone Spurs  08/07/13   Right Shoulder   COLONOSCOPY     COLONOSCOPY N/A 05/08/2012   Procedure: COLONOSCOPY;  Surgeon: Malissa Hippo, MD;  Location: [facility];  Service: Endoscopy;  Laterality: N/A;  730   COLOSTOMY     ESOPHAGOGASTRODUODENOSCOPY N/A 11/04/2013   Procedure: ESOPHAGOGASTRODUODENOSCOPY (EGD);  Surgeon: Malissa Hippo, MD;  Location: [facility];  Service: Endoscopy;  Laterality: N/A;  730   EYE SURGERY  cataract x 2   MALONEY DILATION N/A 11/04/2013   Procedure: MALONEY DILATION;  Surgeon: Malissa Hippo, MD;  Location: [facility];  Service: Endoscopy;  Laterality: N/A;   UPPER GASTROINTESTINAL ENDOSCOPY      Family History  Problem Relation Age of Onset   Colon cancer Mother    Bipolar disorder Mother    Anxiety disorder Mother    Atrial fibrillation Son    Depression Maternal Grandmother     Social History:  reports that she quit smoking about 42 years ago.  Her smoking use included cigarettes. She started smoking about 69 years ago. She has a 40.5 pack-year smoking history. She quit smokeless tobacco use about 42 years ago. She reports current alcohol use of about 1.0 standard drink of alcohol per week. She reports that she does not use drugs.  Allergies:  Allergies  Allergen Reactions   Penicillins Itching and Rash   Hydrocodone Other (See Comments)    MAO-I ; cannot take medications.    Morphine And Codeine     Unable to take due to MAO-I   Morphine Sulfate Other (See Comments)   Sulfamethoxazole-Trimethoprim Nausea Only and Other (See Comments)    And diarrhea. And diarrhea.     Medications:  Prior to Admission medications   Medication Sig Start Date End Date Taking? Authorizing Provider  acetaminophen (TYLENOL) 650 MG CR tablet Take 1,300 mg by mouth daily.    [provider]  ARIPiprazole (ABILIFY) 5 MG tablet Take 5 mg by mouth daily.    [provider]  L-Methylfolate-Algae (L-METHYLFOLATE FORTE) 15-90.314 MG CAPS Take 1 tablet by mouth daily. 04/28/20   [provider]  levothyroxine (SYNTHROID) 88 MCG tablet Take 88 mcg by mouth daily before breakfast.    [provider]  LORazepam (ATIVAN) 0.5 MG tablet Take 1 tablet (0.5 mg total) by mouth 2 (two) times daily. 12/28/21   Fargo,  Amy E, NP  losartan (COZAAR) 25 MG tablet Take 50 mg by mouth 2 (two) times daily. 08/27/21   [provider]  losartan (COZAAR) 50 MG tablet Take 75 mg by mouth See admin instructions. TAKE 75 IN MORNING AND 25 EVERY EVENING 07/02/19   [provider]  metoprolol succinate (TOPROL-XL) 25 MG 24 hr tablet Take 25 mg by mouth daily. 03/26/20   [provider]  Multiple Vitamins-Minerals (MULTIVITAMIN WITH MINERALS) tablet Take 1 tablet by mouth daily.    [provider]  nitrofurantoin (MACRODANTIN) 50 MG capsule Take 1 capsule (50 mg total) by mouth at bedtime. 10/01/21   McKenzie, Mardene Celeste, MD  OVER THE COUNTER MEDICATION B12 1,000 mcg daily    [provider]  OVER THE COUNTER MEDICATION Vit D once per week.    [provider]  OVER THE COUNTER MEDICATION B complex with C once daily.    [provider]  pantoprazole (PROTONIX) 40 MG tablet Take 1 tablet (40 mg total) by mouth daily before breakfast. 04/01/20   Rehman, Joline Maxcy, MD  phenelzine (NARDIL) 15 MG tablet Take 15 mg by mouth 2 (two) times daily. 02/26/20   [provider]  Probiotic, Lactobacillus, CAPS Take 1 capsule by mouth daily.    [provider]  REXULTI 1 MG TABS tablet Take 1 mg by mouth daily. 09/29/21   [provider]  venlafaxine XR (EFFEXOR-XR) 37.5 MG 24 hr capsule Take 37.5 mg by mouth daily. 09/22/21   [provider]     Results for orders placed or performed during the hospital encounter of 05/08/23 (from the past 48 hours)  Lipase, blood     Status: Abnormal   Collection Time: 05/08/23  9:20 PM  Result Value Ref Range   Lipase <10 (L) 11 - 51 U/L    Comment: Performed at [facility], [address], [place], [place] [address]  Comprehensive metabolic panel     Status: Abnormal   Collection Time: 05/08/23  9:20 PM  Result Value Ref Range   Sodium 133 (L) 135 - 145 mmol/L   Potassium 3.6 3.5 - 5.1 mmol/L   Chloride 102 98 - 111 mmol/L   CO2 17 (L) 22 - 32 mmol/L   Glucose, Bld 170 (H) 70 - 99 mg/dL    Comment: Glucose reference range applies only to samples taken after fasting for at least 8 hours.   BUN 29 (H) 8 - 23 mg/dL   Creatinine, Ser 6.04 (H) 0.44 - 1.00 mg/dL   Calcium 9.7 8.9 - 54.0 mg/dL   Total Protein 8.1 6.5 - 8.1 g/dL   Albumin 4.6 3.5 - 5.0 g/dL   AST 17 15 - 41 U/L   ALT 13 0 - 44 U/L   Alkaline Phosphatase 67 38 - 126 U/L   Total Bilirubin 0.4 0.0 - 1.2 mg/dL   GFR, Estimated 42 (L) >60 mL/min    Comment: (NOTE) Calculated using the CKD-EPI Creatinine Equation (2021)    Anion gap 14 5  - 15    Comment: Performed at [facility], [address] [place], [place] [address]  CBC     Status: None   Collection Time: 05/08/23  9:20 PM  Result Value Ref Range   WBC 9.0 4.0 - 10.5 K/uL   RBC 4.34 3.87 - 5.11 MIL/uL   Hemoglobin 14.1 12.0 - 15.0 g/dL   HCT 14.7 82.9 - 56.2 %   MCV 96.1 80.0 -  100.0 fL   MCH 32.5 26.0 - 34.0 pg   MCHC 33.8 30.0 - 36.0 g/dL   RDW 16.1 09.6 - 04.5 %   Platelets 166 150 - 400 K/uL   nRBC 0.0 0.0 - 0.2 %    Comment: Performed at [facility], [address] [place], [place] [address]  CBC with Differential/Platelet     Status: None   Collection Time: 05/09/23  4:40 AM  Result Value Ref Range   WBC 6.7 4.0 - 10.5 K/uL   RBC 4.02 3.87 - 5.11 MIL/uL   Hemoglobin 13.0 12.0 - 15.0 g/dL   HCT 19.1 47.8 - 29.5 %   MCV 97.5 80.0 - 100.0 fL   MCH 32.3 26.0 - 34.0 pg   MCHC 33.2 30.0 - 36.0 g/dL   RDW 62.1 30.8 - 65.7 %   Platelets 155 150 - 400 K/uL   nRBC 0.0 0.0 - 0.2 %   Neutrophils Relative % 67 %   Neutro Abs 4.5 1.7 - 7.7 K/uL   Lymphocytes Relative 18 %   Lymphs Abs 1.2 0.7 - 4.0 K/uL   Monocytes Relative 13 %   Monocytes Absolute 0.9 0.1 - 1.0 K/uL   Eosinophils Relative 1 %   Eosinophils Absolute 0.1 0.0 - 0.5 K/uL   Basophils Relative 0 %   Basophils Absolute 0.0 0.0 - 0.1 K/uL   Immature Granulocytes 1 %   Abs Immature Granulocytes 0.04 0.00 - 0.07 K/uL    Comment: Performed at [facility], [address], [place], [place] [address]  Basic metabolic panel     Status: Abnormal   Collection Time: 05/09/23  4:40 AM  Result Value Ref Range   Sodium 135 135 - 145 mmol/L   Potassium 3.8 3.5 - 5.1 mmol/L   Chloride 103 98 - 111 mmol/L   CO2 21 (L) 22 - 32 mmol/L   Glucose, Bld 160 (H) 70 - 99 mg/dL    Comment: Glucose reference range applies only to samples taken after fasting for at least 8 hours.   BUN 27 (H) 8 - 23 mg/dL   Creatinine, Ser 2.95 (H) 0.44 - 1.00 mg/dL    Calcium 8.6 (L) 8.9 - 10.3 mg/dL   GFR, Estimated 46 (L) >60 mL/min    Comment: (NOTE) Calculated using the CKD-EPI Creatinine Equation (2021)    Anion gap 11 5 - 15    Comment: Performed at [facility], [address] [place], [place] [address]    DG Abdomen 1 View Result Date: 05/09/2023 CLINICAL DATA:  Post intubation. Nausea, vomiting, and diarrhea for 2 days, small-bowel obstruction. EXAM: ABDOMEN - 1 VIEW COMPARISON:  05/09/2023, 05/08/2023. FINDINGS: Multiple loops of distended small bowel are present in the abdomen measuring up to 4.8 cm in diameter. An enteric tube terminates in the stomach. Mild airspace disease is noted at the lung bases. There is levoscoliosis with degenerative changes in the lumbar spine. Surgical changes are present in the left lower quadrant. Excreted contrast is present in the urinary bladder. There is a small outpouching of the bladder on the right, possible diverticulum. IMPRESSION: 1. Slight interval worsening of distended loops of small bowel in the abdomen, compatible with small-bowel obstruction. 2. Mild airspace disease at the lung bases, possible atelectasis or infiltrate. Electronically Signed   By: Thornell Sartorius M.D.   On: 05/09/2023 07:14   DG Chest Portable 1 View Result Date: 05/09/2023 CLINICAL DATA:  Intubation. EXAM: PORTABLE CHEST 1 VIEW COMPARISON:  05/01/2020 FINDINGS: Endotracheal tube with tip 2.5 cm above the carina. The enteric tube at least reaches the stomach. Indistinct airspace density in the lower lungs. No pleural effusion, pneumothorax, or collapse. Normal heart size and mediastinal contours. IMPRESSION: 1. Unremarkable hardware as described. 2. Bilateral pulmonary infiltrate/pneumonia. Electronically Signed   By: Tiburcio Pea M.D.   On: 05/09/2023 07:11   DG Abdomen 1 View Result Date: 05/09/2023 CLINICAL DATA:  NG tube placement EXAM: ABDOMEN - 1 VIEW COMPARISON:  CT abdomen/pelvis dated 05/08/2023 FINDINGS:  Moderate hiatal hernia. Enteric tube terminates in the distal esophagus just above the level of the hiatal hernia. IMPRESSION: Enteric tube terminates in the distal esophagus just above the level of a moderate hiatal hernia. Advancement is recommended. Electronically Signed   By: Charline Bills M.D.   On: 05/09/2023 03:31   CT ABDOMEN PELVIS W CONTRAST Result Date: 05/09/2023 CLINICAL DATA:  Abdominal pain EXAM: CT ABDOMEN AND PELVIS WITH CONTRAST TECHNIQUE: Multidetector CT imaging of the abdomen and pelvis was performed using the standard protocol following bolus administration of intravenous contrast. RADIATION DOSE REDUCTION: This exam was performed according to the departmental dose-optimization program which includes automated exposure control, adjustment of the mA and/or kV according to patient size and/or use of iterative reconstruction technique. CONTRAST:  75mL OMNIPAQUE IOHEXOL 300 MG/ML  SOLN COMPARISON:  05/02/2020 FINDINGS: Lower chest: Large hiatal hernia.  No acute findings. Hepatobiliary: No focal hepatic abnormality. Gallbladder unremarkable. Pancreas: No focal abnormality or ductal dilatation. Spleen: No focal abnormality.  Normal size. Adrenals/Urinary Tract: No adrenal abnormality. No focal renal abnormality. No stones or hydronephrosis. Urinary bladder is unremarkable. Stomach/Bowel: Dilated fluid-filled small bowel loops into the pelvis. Distal small bowel is decompressed. Findings compatible with distal small bowel obstruction. Large bowel unremarkable. Stomach is fluid-filled and distended. Vascular/Lymphatic: Aortoiliac atherosclerosis. No evidence of aneurysm or adenopathy. Reproductive: Prior hysterectomy.  No adnexal masses. Other: No free fluid or free air. Musculoskeletal: No acute bony abnormality. IMPRESSION: Distended fluid-filled stomach and small bowel into the pelvis. Distal small bowel decompressed. Findings compatible with distal small bowel obstruction. Large hiatal  hernia, fluid-filled. Electronically Signed   By: Charlett Nose M.D.   On: 05/09/2023 00:46    Review of Systems Unable to obtain - patient intubated Blood pressure (!) 141/80, pulse (!) 125, temperature 100.3 F (37.9 C), resp. rate 20, height 5' (1.524 m), weight 53.1 kg, SpO2 96%. Physical Exam CONSTITUTIONAL: Chronically ill-appearing, NAD NEURO/PSYCH:  Alert and oriented x 3, no focal deficits EYES:  eyes equal and reactive ENT/NECK:  no LAD, no JVD CARDIO: Regular rate, well-perfused, normal S1 and S2 PULM: ventilated GI/GU:  non-distended, non-tender MSK/SPINE:  No gross deformities, no edema SKIN:  no rash, atraumatic   Assessment/Plan: Small bowel obstruction Aspiration event - intubated  Recs:  This appears to be a partial obstruction.  Continue NG tube while intubated.  We will follow to monitor bowel function.  No indications for acute surgical intervention.  Wilmon Arms Meshia Rau 05/09/2023, 7:56 AM

## 2023-05-09 NOTE — Progress Notes (Signed)
 Pharmacy Electrolyte Replacement  Recent Labs:  Recent Labs    05/09/23 0747 05/09/23 0805  K 3.2* 3.2*  MG 1.7  --   PHOS 3.9  --   CREATININE 1.06*  --     Low Critical Values (K </= 2.5, Phos </= 1, Mg </= 1) Present: None  MD Contacted: Mertha Baars, PA  Plan: KCl 10 mEq IV x6 and Magnesium 2g IV x1.  Continue to monitor lytes.   Link Snuffer, PharmD, BCPS, BCCCP Please refer to [facility] for [facility] numbers 05/09/2023, 11:31 AM

## 2023-05-09 NOTE — ED Notes (Signed)
 NGT per xray needs to advance tube. When attempted met resistance. Reattempts of placement were unsuccessful. MD made aware.

## 2023-05-09 NOTE — H&P (Signed)
 NAME:  Erica [name], MRN:  [id number], DOB:  June 06, 1932, LOS: 0 ADMISSION DATE:  05/08/2023, CONSULTATION DATE:  05/09/2023 REFERRING MD:  Pilar Plate - ED, CHIEF COMPLAINT:  respiratory failure   History of Present Illness:  89 year old woman who presented with 48h of nausea, vomiting and diarrhea.  Found to have SBO but difficulty passing NGT led to aspiration event. Decision made to intubate to facilitate placement as detailed in Dr Acey Lav note.   Pertinent  Medical History   Past Medical History:  Diagnosis Date   Anxiety    Arthritis    Bipolar disorder (HCC)    Depression    Hyperlipidemia    Hypothyroidism    Hypothyroidism    Irritable bowel syndrome    RA (rheumatoid arthritis) (HCC)    Significant Hospital Events: Including procedures, antibiotic start and stop dates in addition to other pertinent events   2/25 admitted to ICU  Interim History / Subjective:  NGT drained 1 liter post insertion. Patient has been hypertensive.   Objective   Blood pressure (!) 141/80, pulse (!) 125, temperature 100.3 F (37.9 C), resp. rate 20, height 5' (1.524 m), weight 53.1 kg, SpO2 96%.    Vent Mode: PRVC FiO2 (%):  [100 %] 100 % Set Rate:  [16 bmp-20 bmp] 16 bmp Vt Set:  [360 mL] 360 mL PEEP:  [5 cmH20] 5 cmH20 Plateau Pressure:  [17 cmH20] 17 cmH20   Intake/Output Summary (Last 24 hours) at 05/09/2023 0817 Last data filed at 05/09/2023 6578 Gross per 24 hour  Intake 1552.24 ml  Output 1400 ml  Net 152.24 ml   Filed Weights   05/09/23 0615  Weight: 53.1 kg    Examination: General: Frail appearing woman.  HENT: no icterus. No supraclavicular nodes. Orally intubated. NG tube with moderate foul smelling drainage. Lungs: clear bilaterally with acceptable airway pressures. Cardiovascular: JVP at 2 cm ASA, HS normal. Extremities warm. Abdomen: distended, tympanitic, tinkling bowel sounds. Remote mid-line incision.   Extremities: joint deformation of both hands and feet  consistent with RA. Mild edema of both legs Neuro: sedated with no response to pain.  GU: Foley in place.   Ancillary Tests Personally Reviewed:  CXR shows bibasilar infiltrate R>L CT shows markedly distended, fluid filled stomach and small bowel down to the pelvis.  ABG shows mixed respiratory and metabolic acidosis.  Creatinine 1.14 CBC normal.  Assessment & Plan:  Small bowel obstruction with transition point at the level of the ileum Suspect adhesions from prior surgery as cause.  Hypoxic, hypercarbic respiratory failure due to aspiration.  RA  Plan:  - No plan for surgical intervention at this time.  - Continue NG drainage.  - Maintain euvolemia, match NGT losses. Correct electrolyte abnormalities - Start Unasyn for SBO/aspiration.  - Wean sedation off and proceed to SBT and extubation if possible. Early mobility thereafter.  Best Practice (right click and "Reselect all SmartList Selections" daily)   Diet/type: NPO DVT prophylaxis LMWH Pressure ulcer(s): N/A GI prophylaxis: PPI Lines: N/A Foley:  Yes, and it is still needed Code Status:  DNR Last date of multidisciplinary goals of care discussion [pending]   CRITICAL CARE Performed by: Lynnell Catalan   Total critical care time: 40 minutes  Critical care time was exclusive of separately billable procedures and treating other patients.  Critical care was necessary to treat or prevent imminent or life-threatening deterioration.  Critical care was time spent personally by me on the following activities: development of treatment plan with  patient and/or surrogate as well as nursing, discussions with consultants, evaluation of patient's response to treatment, examination of patient, obtaining history from patient or surrogate, ordering and performing treatments and interventions, ordering and review of laboratory studies, ordering and review of radiographic studies, pulse oximetry, re-evaluation of patient's condition and  participation in multidisciplinary rounds.  Lynnell Catalan, MD [facility] ICU Physician [facility]  Pager: [phone] Mobile: [phone] After hours: [phone].

## 2023-05-09 NOTE — ED Notes (Signed)
 Just prior to departure with [facility], patient was intubated and NG placed

## 2023-05-09 NOTE — ED Notes (Signed)
 Vent settings given to [facility] that were at bedside for transport. VT settings of 8cc/kg given to transport (VT 420), RR 20, Peep +8, FiO2 100%

## 2023-05-09 NOTE — Progress Notes (Signed)
 Patient transported from ED #16 to 3M02 with RT and RN, no complications noted.

## 2023-05-09 NOTE — ED Notes (Signed)
 Patient abdomen has become more distended. States nausea has subsided. Md made aware. Monitor with sinus tach rhythm with pac, pvc

## 2023-05-09 NOTE — Procedures (Signed)
 Extubation Procedure Note  Patient Details:   Name: Erica [name] DOB: March 23, 1932 MRN: [id number]   Airway Documentation:    Vent end date: 05/09/23 Vent end time: 1722   Evaluation  O2 sats: stable throughout Complications: No apparent complications Patient did tolerate procedure well. Bilateral Breath Sounds: Diminished, Rhonchi   Yes  Kegan Mckeithan 05/09/2023, 5:30 PM

## 2023-05-09 NOTE — ED Notes (Signed)
Attempted to collect urine. Pt unable to void at this time.

## 2023-05-09 NOTE — ED Provider Notes (Signed)
 I was asked to evaluate the patient by nursing staff on arrival who presents hypertensive and tachycardic.  EKG is performed.  This does show what appears to be a sinus tachycardia with PAC VCs.  The patient's blood pressure is significantly elevated here.  I will give the patient a dose of labetalol.  A call was placed to the ICU to inform them that the patient is here.  They will be coming down to evaluate the patient shortly.  The patient's heart rate and blood pressure did improve with the labetalol.  Her heart rate is down in the 120s and blood pressures improved to 140/80.  CRITICAL CARE Performed by: Durwin Glaze   Total critical care time: 32 minutes  Critical care time was exclusive of separately billable procedures and treating other patients.  Critical care was necessary to treat or prevent imminent or life-threatening deterioration.  Critical care was time spent personally by me on the following activities: development of treatment plan with patient and/or surrogate as well as nursing, discussions with consultants, evaluation of patient's response to treatment, examination of patient, obtaining history from patient or surrogate, ordering and performing treatments and interventions, ordering and review of laboratory studies, ordering and review of radiographic studies, pulse oximetry and re-evaluation of patient's condition.    Durwin Glaze, MD 05/09/23 0800

## 2023-05-09 NOTE — TOC CM/SW Note (Addendum)
 Transition of Care [facility]) - Inpatient Brief Assessment   Patient Details  Name: WAVERLY TARQUINIO MRN: [id number] Date of Birth: 07/20/1932  [facility] CM/SW Contact:    Tom-Johnson, Hershal Coria, RN Phone Number: 05/09/2023, 4:28 PM   Clinical Narrative:  Patient presented to the ED from [facility] with N/V and Diarrhea for the past 2 days. Admitted with Small Bowel Obstruction. Patient Aspirated while NG Tube being placed for decompression, Intubated and sedated to facilitate placement. Gen sx following, or surgical intervention at this time. On IV abx.   Patient not Medically ready for discharge.  CM will continue to follow as patient progresses with care towards discharge.           Transition of Care Asessment: Insurance and Status: Insurance coverage has been reviewed Patient has primary care physician: Yes Home environment has been reviewed: Yes Prior level of function:: Modified Assistance Prior/Current Home Services: Current home services (From [facility]) Social Drivers of Health Review: SDOH reviewed no interventions necessary Readmission risk has been reviewed: Yes Transition of care needs: transition of care needs identified, TOC will continue to follow

## 2023-05-10 ENCOUNTER — Inpatient Hospital Stay (HOSPITAL_COMMUNITY): Payer: Medicare Other

## 2023-05-10 ENCOUNTER — Encounter (HOSPITAL_COMMUNITY): Payer: Self-pay | Admitting: Pulmonary Disease

## 2023-05-10 DIAGNOSIS — I4891 Unspecified atrial fibrillation: Secondary | ICD-10-CM

## 2023-05-10 DIAGNOSIS — J9601 Acute respiratory failure with hypoxia: Secondary | ICD-10-CM | POA: Diagnosis not present

## 2023-05-10 DIAGNOSIS — J9602 Acute respiratory failure with hypercapnia: Secondary | ICD-10-CM | POA: Diagnosis not present

## 2023-05-10 DIAGNOSIS — K56609 Unspecified intestinal obstruction, unspecified as to partial versus complete obstruction: Secondary | ICD-10-CM | POA: Diagnosis not present

## 2023-05-10 LAB — GLUCOSE, CAPILLARY
Glucose-Capillary: 77 mg/dL (ref 70–99)
Glucose-Capillary: 80 mg/dL (ref 70–99)
Glucose-Capillary: 86 mg/dL (ref 70–99)
Glucose-Capillary: 87 mg/dL (ref 70–99)
Glucose-Capillary: 93 mg/dL (ref 70–99)
Glucose-Capillary: 98 mg/dL (ref 70–99)

## 2023-05-10 LAB — CBC
HCT: 40.4 % (ref 36.0–46.0)
Hemoglobin: 13.4 g/dL (ref 12.0–15.0)
MCH: 33 pg (ref 26.0–34.0)
MCHC: 33.2 g/dL (ref 30.0–36.0)
MCV: 99.5 fL (ref 80.0–100.0)
Platelets: 98 10*3/uL — ABNORMAL LOW (ref 150–400)
RBC: 4.06 MIL/uL (ref 3.87–5.11)
RDW: 12.3 % (ref 11.5–15.5)
WBC: 14.1 10*3/uL — ABNORMAL HIGH (ref 4.0–10.5)
nRBC: 0 % (ref 0.0–0.2)

## 2023-05-10 LAB — BASIC METABOLIC PANEL
Anion gap: 12 (ref 5–15)
BUN: 18 mg/dL (ref 8–23)
CO2: 17 mmol/L — ABNORMAL LOW (ref 22–32)
Calcium: 7.9 mg/dL — ABNORMAL LOW (ref 8.9–10.3)
Chloride: 107 mmol/L (ref 98–111)
Creatinine, Ser: 1.12 mg/dL — ABNORMAL HIGH (ref 0.44–1.00)
GFR, Estimated: 47 mL/min — ABNORMAL LOW (ref >60)
Glucose, Bld: 94 mg/dL (ref 70–99)
Potassium: 4.2 mmol/L (ref 3.5–5.1)
Sodium: 136 mmol/L (ref 135–145)

## 2023-05-10 LAB — PHOSPHORUS: Phosphorus: 2.7 mg/dL (ref 2.5–4.6)

## 2023-05-10 LAB — MAGNESIUM: Magnesium: 2.2 mg/dL (ref 1.7–2.4)

## 2023-05-10 MED ORDER — ACETAMINOPHEN 10 MG/ML IV SOLN
1000.0000 mg | Freq: Three times a day (TID) | INTRAVENOUS | Status: AC | PRN
  Administered 2023-05-10: 1000 mg via INTRAVENOUS
  Filled 2023-05-10: qty 100

## 2023-05-10 MED ORDER — AMIODARONE LOAD VIA INFUSION
150.0000 mg | Freq: Once | INTRAVENOUS | Status: AC
  Administered 2023-05-10: 150 mg via INTRAVENOUS
  Filled 2023-05-10: qty 83.34

## 2023-05-10 MED ORDER — AMIODARONE IV BOLUS ONLY 150 MG/100ML: 150.0000 mg | Freq: Once | INTRAVENOUS | Status: DC

## 2023-05-10 MED ORDER — METOPROLOL TARTRATE 5 MG/5ML IV SOLN
2.5000 mg | Freq: Once | INTRAVENOUS | Status: AC
  Administered 2023-05-10: 2.5 mg via INTRAVENOUS
  Filled 2023-05-10: qty 5

## 2023-05-10 MED ORDER — DIATRIZOATE MEGLUMINE & SODIUM 66-10 % PO SOLN
90.0000 mL | Freq: Once | ORAL | Status: AC
  Administered 2023-05-10: 90 mL via NASOGASTRIC
  Filled 2023-05-10: qty 90

## 2023-05-10 NOTE — Progress Notes (Signed)
 [facility] Physician-Brief Progress Note Patient Name: ANIEYA HELMAN DOB: 1932/11/29 MRN: [id number]   Date of Service  05/10/2023  HPI/Events of Note  RN reports pt c/o headache.  NPO for bowel obstruction, cannot have PO/PT.  States she had IV tylenol on MAR yesterday but it has completed.  Asking if she can have an order for IV tylenol prn  eICU Interventions  Order placed back. AST ALT ok on last check     Intervention Category Intermediate Interventions: Pain - evaluation and management  Halil Rentz G Siyana Erney 05/10/2023, 8:01 PM  Addendum at 5-55 am - Sustained A fib with rates in 120s. SBP 150s. Got one dose IV metoprolol yesterday and is on IV amiodarone as well. Repeat metoprolol dose x1

## 2023-05-10 NOTE — Evaluation (Signed)
 Physical Therapy Evaluation Patient Details Name: Erica [name] MRN: [id number] DOB: 1932-06-28 Today's Date: 05/10/2023  History of Present Illness  Pt is 89 year old presented to [facility] ED on  05/08/23 for nausea, vomiting, diarrhea. Pt found to have partial SBO. Difficulty placing NGT led to aspiration and pt intubated and transferred to [facility]. Pt extubated 05/09/23. Pt developed afib with rvr. PMH - rheumatoid arthritis, bipolar  Clinical Impression  Pt admitted with above diagnosis and presents to PT with functional limitations due to deficits listed below (See PT problem list). Pt needs skilled PT to maximize independence and safety. Pt from [facility] and typically uses rollator with assist to ambulate. Currently max assist to mobilize to EOB and stand EOB. Expect steady progress if medical situation improves but doubt she can go back to ALF level and will need higher level. Patient will benefit from continued inpatient follow up therapy, <3 hours/day.          If plan is discharge home, recommend the following: A lot of help with walking and/or transfers;A lot of help with bathing/dressing/bathroom;Assist for transportation   Can travel by private vehicle   No    Equipment Recommendations None recommended by PT  Recommendations for Other Services       Functional Status Assessment Patient has had a recent decline in their functional status and demonstrates the ability to make significant improvements in function in a reasonable and predictable amount of time.     Precautions / Restrictions Precautions Precautions: Fall;Other (comment) Precaution/Restrictions Comments: NG tube Restrictions Weight Bearing Restrictions Per Provider Order: No      Mobility  Bed Mobility Overal bed mobility: Needs Assistance Bed Mobility: Supine to Sit, Sit to Supine     Supine to sit: Max assist, HOB elevated Sit to supine: Max assist   General bed mobility comments: Assist to  bring legs off of bed, elevate trunk into sitting and bring hips to EOb    Transfers Overall transfer level: Needs assistance Equipment used: 1 person hand held assist Transfers: Sit to/from Stand Sit to Stand: Mod assist           General transfer comment: Using bilateral shelf arm support. Assist to power up and stabilize.    Ambulation/Gait             Pre-gait activities: Side stepped up edge of bed a few small shuffling steps    Stairs            Wheelchair Mobility     Tilt Bed    Modified Rankin (Stroke Patients Only)       Balance Overall balance assessment: Needs assistance Sitting-balance support: Bilateral upper extremity supported, Feet unsupported Sitting balance-Leahy Scale: Poor Sitting balance - Comments: UE support and CGA for static sitting   Standing balance support: Bilateral upper extremity supported Standing balance-Leahy Scale: Poor Standing balance comment: Mod assist for static standing                             Pertinent Vitals/Pain Pain Assessment Pain Assessment: Faces Faces Pain Scale: Hurts little more Pain Location: abdomen Pain Descriptors / Indicators: Pressure Pain Intervention(s): Limited activity within patient's tolerance, Monitored during session, Repositioned    Home Living Family/patient expects to be discharged to:: Assisted living                 Home Equipment: Rollator (4 wheels) Additional Comments: [facility]  Prior Function Prior Level of Function : Needs assist       Physical Assist : Mobility (physical);ADLs (physical) Mobility (physical): Bed mobility;Transfers;Gait ADLs (physical): Bathing;Dressing;Toileting Mobility Comments: Uses rollator       Extremity/Trunk Assessment   Upper Extremity Assessment Upper Extremity Assessment: Defer to OT evaluation    Lower Extremity Assessment Lower Extremity Assessment: Generalized weakness       Communication    Communication Communication: Impaired Factors Affecting Communication: Hearing impaired    Cognition Arousal: Alert Behavior During Therapy: Flat affect   PT - Cognitive impairments: No apparent impairments                         Following commands: Impaired Following commands impaired: Follows one step commands with increased time     Cueing Cueing Techniques: Verbal cues, Tactile cues     General Comments General comments (skin integrity, edema, etc.): HR 110's to 140's    Exercises     Assessment/Plan    PT Assessment Patient needs continued PT services  PT Problem List Decreased strength;Decreased activity tolerance;Decreased balance;Decreased mobility       PT Treatment Interventions DME instruction;Gait training;Functional mobility training;Therapeutic activities;Therapeutic exercise;Balance training;Patient/family education    PT Goals (Current goals can be found in the Care Plan section)  Acute Rehab PT Goals Patient Stated Goal: rest PT Goal Formulation: With patient Time For Goal Achievement: 05/24/23 Potential to Achieve Goals: Fair    Frequency Min 1X/week     Co-evaluation               AM-PAC PT "6 Clicks" Mobility  Outcome Measure Help needed turning from your back to your side while in a flat bed without using bedrails?: A Lot Help needed moving from lying on your back to sitting on the side of a flat bed without using bedrails?: A Lot Help needed moving to and from a bed to a chair (including a wheelchair)?: Total Help needed standing up from a chair using your arms (e.g., wheelchair or bedside chair)?: A Lot Help needed to walk in hospital room?: Total Help needed climbing 3-5 steps with a railing? : Total 6 Click Score: 9    End of Session Equipment Utilized During Treatment: Oxygen;Gait belt Activity Tolerance: Patient limited by fatigue Patient left: in bed;with call bell/phone within reach;with family/visitor  present Nurse Communication: Mobility status PT Visit Diagnosis: Unsteadiness on feet (R26.81);Other abnormalities of gait and mobility (R26.89);Muscle weakness (generalized) (M62.81)    Time: 6301-6010 PT Time Calculation (min) (ACUTE ONLY): 25 min   Charges:   PT Evaluation $PT Eval Moderate Complexity: 1 Mod PT Treatments $Therapeutic Activity: 8-22 mins PT General Charges $$ ACUTE PT VISIT: 1 Visit         [facility] PT Acute Rehabilitation Services Office [phone]   Angelina Ok [facility] 05/10/2023, 4:35 PM

## 2023-05-10 NOTE — TOC Progression Note (Addendum)
 Transition of Care [facility]) - Progression Note    Patient Details  Name: Erica [name] MRN: [id number] Date of Birth: January 24, 1933  Transition of Care [facility]) CM/SW Contact  Tom-Johnson, Hershal Coria, RN Phone Number: 05/10/2023, 12:47 PM  Clinical Narrative:     Patient extubated yesterday 05/09/23, currently on 4L O2.  Patient developed post extubation A-fib requiring IV Amiodarone. Continues on IV abx. Gen sx following, SBO protocol continues NG tube in place.   Awaits PT/OT eval for disposition.  Patient not Medically ready for discharge.  CM will continue to follow as patient progresses with care towards discharge.             Expected Discharge Plan and Services                                               Social Determinants of Health (SDOH) Interventions SDOH Screenings   Financial Resource Strain: Low Risk  (02/13/2020)   Received from [facility], [facility]  Social Connections: Unknown (07/27/2021)   Received from [facility], [facility]  Stress: Stress Concern Present (02/13/2020)   Received from [facility], [facility]  Tobacco Use: Medium Risk (05/09/2023)    Readmission Risk Interventions    05/09/2023    4:27 PM  Readmission Risk Prevention Plan  Transportation Screening Complete  PCP or Specialist Appt within 5-7 Days Complete  Home Care Screening Complete  Medication Review (RN CM) Referral to Pharmacy

## 2023-05-10 NOTE — Plan of Care (Signed)

## 2023-05-10 NOTE — Progress Notes (Signed)
 Progress Note     Subjective: Awake and alert with some confusion regarding the events of yesterday but overall oriented. She denies abdominal pain or nausea. She states she does not feel bloated. She denies flatus or BM. She tells me she came into the ED only at the urging of her facility staff after an episode of dark emesis and otherwise had been having diarrhea without abdominal pain  Son [place] at bedside and assists with history  Objective: Vital signs in last 24 hours: Temp:  [98.2 F (36.8 C)-101.5 F (38.6 C)] 99 F (37.2 C) (02/26 0800) Pulse Rate:  [100-157] 122 (02/26 0800) Resp:  [11-35] 27 (02/26 0800) BP: (73-146)/(48-96) 95/74 (02/26 0800) SpO2:  [79 %-100 %] 94 % (02/26 0800) FiO2 (%):  [50 %-100 %] 50 % (02/25 1600) Weight:  [56.8 kg-58.1 kg] 58.1 kg (02/26 0500) Last BM Date :  (PTA)  Intake/Output from previous day: 02/25 0701 - 02/26 0700 In: 6446.2 [I.V.:3117.9; IV Piggyback:3328.3] Out: 2595 [Urine:2295; Emesis/NG output:300] Intake/Output this shift: No intake/output data recorded.  PE: General: pleasant, WD, female who is laying in bed in NAD Lungs: Respiratory effort nonlabored on [place] Abd: soft, mild distension. Mild LLQ TTP without rebound or guarding. Well healed midline and LLQ scars. NGT with thin brown contents MSK: all 4 extremities are symmetrical with no cyanosis, clubbing, or edema. Skin: warm and dry  Psych: A&Ox3 with an appropriate affect.    Lab Results:  Recent Labs    05/09/23 0747 05/09/23 0805 05/09/23 1704 05/10/23 0528  WBC 4.8  --   --  14.1*  HGB 14.7   < > 11.6* 13.4  HCT 45.7   < > 34.0* 40.4  PLT 128*  --   --  98*   < > = values in this interval not displayed.   BMET Recent Labs    05/09/23 0747 05/09/23 0805 05/09/23 1704 05/10/23 0528  NA 136   < > 134* 136  K 3.2*   < > 4.5 4.2  CL 108  --   --  107  CO2 18*  --   --  17*  GLUCOSE 134*  --   --  94  BUN 25*  --   --  18  CREATININE 1.06*  --   --   1.12*  CALCIUM 8.0*  --   --  7.9*   < > = values in this interval not displayed.   PT/INR Recent Labs    05/09/23 0747  LABPROT 15.4*  INR 1.2   CMP     Component Value Date/Time   NA 136 05/10/2023 0528   NA 141 11/28/2016 0000   K 4.2 05/10/2023 0528   K 4.0 11/28/2016 0000   CL 107 05/10/2023 0528   CL 101 11/28/2016 0000   CO2 17 (L) 05/10/2023 0528   CO2 22 11/28/2016 0000   GLUCOSE 94 05/10/2023 0528   BUN 18 05/10/2023 0528   BUN 11 11/28/2016 0000   CREATININE 1.12 (H) 05/10/2023 0528   CREATININE 0.69 06/21/2017 1453   CALCIUM 7.9 (L) 05/10/2023 0528   CALCIUM 9.1 11/28/2016 0000   PROT 7.0 05/09/2023 0747   ALBUMIN 3.3 (L) 05/09/2023 0747   AST 26 05/09/2023 0747   ALT 14 05/09/2023 0747   ALKPHOS 50 05/09/2023 0747   BILITOT 0.7 05/09/2023 0747   GFRNONAA 47 (L) 05/10/2023 0528   GFRNONAA 84 11/28/2016 0000   GFRAA >60 05/08/2017 0544   GFRAA 97 11/28/2016  0000   Lipase     Component Value Date/Time   LIPASE <10 (L) 05/08/2023 2120       Studies/Results: DG Abdomen 1 View Result Date: 05/09/2023 CLINICAL DATA:  Post intubation. Nausea, vomiting, and diarrhea for 2 days, small-bowel obstruction. EXAM: ABDOMEN - 1 VIEW COMPARISON:  05/09/2023, 05/08/2023. FINDINGS: Multiple loops of distended small bowel are present in the abdomen measuring up to 4.8 cm in diameter. An enteric tube terminates in the stomach. Mild airspace disease is noted at the lung bases. There is levoscoliosis with degenerative changes in the lumbar spine. Surgical changes are present in the left lower quadrant. Excreted contrast is present in the urinary bladder. There is a small outpouching of the bladder on the right, possible diverticulum. IMPRESSION: 1. Slight interval worsening of distended loops of small bowel in the abdomen, compatible with small-bowel obstruction. 2. Mild airspace disease at the lung bases, possible atelectasis or infiltrate. Electronically Signed   By: Thornell Sartorius M.D.   On: 05/09/2023 07:14   DG Chest Portable 1 View Result Date: 05/09/2023 CLINICAL DATA:  Intubation. EXAM: PORTABLE CHEST 1 VIEW COMPARISON:  05/01/2020 FINDINGS: Endotracheal tube with tip 2.5 cm above the carina. The enteric tube at least reaches the stomach. Indistinct airspace density in the lower lungs. No pleural effusion, pneumothorax, or collapse. Normal heart size and mediastinal contours. IMPRESSION: 1. Unremarkable hardware as described. 2. Bilateral pulmonary infiltrate/pneumonia. Electronically Signed   By: Tiburcio Pea M.D.   On: 05/09/2023 07:11   DG Abdomen 1 View Result Date: 05/09/2023 CLINICAL DATA:  NG tube placement EXAM: ABDOMEN - 1 VIEW COMPARISON:  CT abdomen/pelvis dated 05/08/2023 FINDINGS: Moderate hiatal hernia. Enteric tube terminates in the distal esophagus just above the level of the hiatal hernia. IMPRESSION: Enteric tube terminates in the distal esophagus just above the level of a moderate hiatal hernia. Advancement is recommended. Electronically Signed   By: Charline Bills M.D.   On: 05/09/2023 03:31   CT ABDOMEN PELVIS W CONTRAST Result Date: 05/09/2023 CLINICAL DATA:  Abdominal pain EXAM: CT ABDOMEN AND PELVIS WITH CONTRAST TECHNIQUE: Multidetector CT imaging of the abdomen and pelvis was performed using the standard protocol following bolus administration of intravenous contrast. RADIATION DOSE REDUCTION: This exam was performed according to the departmental dose-optimization program which includes automated exposure control, adjustment of the mA and/or kV according to patient size and/or use of iterative reconstruction technique. CONTRAST:  75mL OMNIPAQUE IOHEXOL 300 MG/ML  SOLN COMPARISON:  05/02/2020 FINDINGS: Lower chest: Large hiatal hernia.  No acute findings. Hepatobiliary: No focal hepatic abnormality. Gallbladder unremarkable. Pancreas: No focal abnormality or ductal dilatation. Spleen: No focal abnormality.  Normal size. Adrenals/Urinary  Tract: No adrenal abnormality. No focal renal abnormality. No stones or hydronephrosis. Urinary bladder is unremarkable. Stomach/Bowel: Dilated fluid-filled small bowel loops into the pelvis. Distal small bowel is decompressed. Findings compatible with distal small bowel obstruction. Large bowel unremarkable. Stomach is fluid-filled and distended. Vascular/Lymphatic: Aortoiliac atherosclerosis. No evidence of aneurysm or adenopathy. Reproductive: Prior hysterectomy.  No adnexal masses. Other: No free fluid or free air. Musculoskeletal: No acute bony abnormality. IMPRESSION: Distended fluid-filled stomach and small bowel into the pelvis. Distal small bowel decompressed. Findings compatible with distal small bowel obstruction. Large hiatal hernia, fluid-filled. Electronically Signed   By: Charlett Nose M.D.   On: 05/09/2023 00:46    Anti-infectives: Anti-infectives (From admission, onward)    Start     Dose/Rate Route Frequency Ordered Stop   05/09/23 1000  cefTRIAXone (ROCEPHIN) 2  g in sodium chloride 0.9 % 100 mL IVPB        2 g 200 mL/hr over 30 Minutes Intravenous Every 24 hours 05/09/23 0906     05/09/23 1000  metroNIDAZOLE (FLAGYL) IVPB 500 mg        500 mg 100 mL/hr over 60 Minutes Intravenous Every 12 hours 05/09/23 0906     05/09/23 0430  levofloxacin (LEVAQUIN) IVPB 750 mg        750 mg 100 mL/hr over 90 Minutes Intravenous  Once 05/09/23 0424 05/09/23 0602        Assessment/Plan SBO - partial - CT w/ Distended fluid-filled stomach and small bowel into the pelvis. Distal small bowel decompressed. Findings compatible with distal small bowel obstruction. Large hiatal hernia, fluid-filled - No current indication for emergency surgery - aspiration with NGT placement - intubated and now s/p extubation - febrile and WBC 14 in setting of aspiration event - NGT with 424ml/24h. No bowel function yet. Start SBO protocol - Keep K > 4 and Mg > 2 for bowel function - Mobilize for bowel  function - Hopefully patient will improve with conservative management. If patient fails to improve with conservative management, she may require exploratory surgery during admission  New afib RVR - if anticoagulation needed okay with heparin gtt from surgical standpoint  FEN: NGT LIWS, IVF per primary ID: rocephin/flagyl (aspiration) VTE: okay with chemical ppx or hep gtt from surgical standpoint  Per primary: Acute onset afib RVR - on amio drip HLD IBS RA BPD H/o abdominal hysterectomy, h/o colostomy s/p reversal  I reviewed hospitalist notes, last 24 h vitals and pain scores, last 48 h intake and output, last 24 h labs and trends, and last 24 h imaging results.    LOS: 1 day   Eric Form, [facility] 05/10/2023, 8:26 AM Please see Amion for pager number during day hours 7:00am-4:30pm

## 2023-05-10 NOTE — Progress Notes (Signed)
 eLink Physician-Brief Progress Note Patient Name: Erica [name] DOB: 03-Dec-1932 MRN: [id number]   Date of Service  05/10/2023  HPI/Events of Note  Notified of rapid atrial fibrillation with rate in the 120s-130s despite being on amiodarone gtt.  Pt has been weaned off levophed.   BP 111/71, HR 131, RR 14, o2 sats 94%.   eICU Interventions  Give another amiodarone 150mg  IV bolus and increase amiodarone gtt back to 60mg /hr.     Intervention Category Intermediate Interventions: Arrhythmia - evaluation and management  Larinda Buttery 05/10/2023, 4:57 AM

## 2023-05-10 NOTE — Progress Notes (Signed)
 NAME:  Erica [name], MRN:  [id number], DOB:  December 07, 1932, LOS: 1 ADMISSION DATE:  05/08/2023, CONSULTATION DATE:  05/09/2023 REFERRING MD:  Erica [name] - ED, CHIEF COMPLAINT:  respiratory failure   History of Present Illness:  89 year old woman who presented with 48h of nausea, vomiting and diarrhea.  Found to have SBO but difficulty passing NGT led to aspiration event. Decision made to intubate to facilitate placement as detailed in Dr Erica [name] note.   Pertinent  Medical History   Past Medical History:  Diagnosis Date   Anxiety    Arthritis    Bipolar disorder (HCC)    Depression    Hyperlipidemia    Hypothyroidism    Hypothyroidism    Irritable bowel syndrome    RA (rheumatoid arthritis) (HCC)    Significant Hospital Events: Including procedures, antibiotic start and stop dates in addition to other pertinent events   2/25 admitted to ICU, was intubated for aspiration during NGT placement. Extubated in evening  2/26 improved pressures, going to try a small dose of metop for rate   Interim History / Subjective:  Overnight req amio bolus and gtt for fib rvr   Off pressors   Objective   Blood pressure 95/74, pulse (!) 122, temperature 99 F (37.2 C), temperature source Bladder, resp. rate (!) 27, height 5' (1.524 m), weight 58.1 kg, SpO2 94%.    Vent Mode: PSV;CPAP FiO2 (%):  [50 %-100 %] 50 % Set Rate:  [20 bmp] 20 bmp Vt Set:  [260 mL] 260 mL PEEP:  [5 cmH20-8 cmH20] 5 cmH20 Pressure Support:  [5 cmH20] 5 cmH20   Intake/Output Summary (Last 24 hours) at 05/10/2023 1057 Last data filed at 05/10/2023 0865 Gross per 24 hour  Intake 5968.03 ml  Output 2795 ml  Net 3173.03 ml   Filed Weights   05/09/23 0615 05/09/23 0851 05/10/23 0500  Weight: 53.1 kg 56.8 kg 58.1 kg    Examination:  General: chronically ill elderly F NAD  Neuro: hard of hearing awake oriented x3  HEENT: NCAt. NGT in place, [name] in place   Lungs: CTAb shallow respirations  CV: irir s1s2  GI: soft ndnt   GU: foley  MSK: chronic arthritic changes  Skin: pale c/d/w  Ancillary Tests Personally Reviewed:    Assessment & Plan:   Acute hypoxic and hypercarbic resp failure Aspiration into airway  Partial SBO  Fluid filled hiatal hernia  Afib RVR  RA  AKI NAGMA Thrombocytopenia  DNR  -GOC document from 2022 indicating DNR, and "no artificial life support"  Plan: -CCS is consulted, no acute surgical need at this time -cont NGT -for gastrografin study  -IS, pulm hygiene -unasyn  -wean O2 for spo2 > 90  -cont amio gtt for now, will also try a low dose IV metop  -defer systemic AC for now-- hope is this is r/t her acute process. May need to consider in the coming day or so   Best Practice (right click and "Reselect all SmartList Selections" daily)   Diet/type: NPO DVT prophylaxis LMWH Pressure ulcer(s): N/A GI prophylaxis: PPI Lines: N/A Foley:  Yes, and it is still needed Code Status:  DNR Last date of multidisciplinary goals of care discussion [son updated at bedside   CRITICAL CARE Performed by: Erica [name]   Total critical care time: 38 minutes  Critical care time was exclusive of separately billable procedures and treating other patients. Critical care was necessary to treat or prevent imminent or life-threatening deterioration.  Critical care was time spent personally by me on the following activities: development of treatment plan with patient and/or surrogate as well as nursing, discussions with consultants, evaluation of patient's response to treatment, examination of patient, obtaining history from patient or surrogate, ordering and performing treatments and interventions, ordering and review of laboratory studies, ordering and review of radiographic studies, pulse oximetry and re-evaluation of patient's condition.  Erica Fass MSN, AGACNP-BC [facility] Pulmonary/Critical Care Medicine Amion for pager 05/10/2023, 10:57 AM

## 2023-05-11 ENCOUNTER — Inpatient Hospital Stay (HOSPITAL_COMMUNITY): Payer: Medicare Other

## 2023-05-11 DIAGNOSIS — J9601 Acute respiratory failure with hypoxia: Secondary | ICD-10-CM | POA: Diagnosis not present

## 2023-05-11 DIAGNOSIS — I4891 Unspecified atrial fibrillation: Secondary | ICD-10-CM

## 2023-05-11 DIAGNOSIS — L8990 Pressure ulcer of unspecified site, unspecified stage: Secondary | ICD-10-CM | POA: Insufficient documentation

## 2023-05-11 DIAGNOSIS — K56609 Unspecified intestinal obstruction, unspecified as to partial versus complete obstruction: Principal | ICD-10-CM

## 2023-05-11 DIAGNOSIS — J9602 Acute respiratory failure with hypercapnia: Secondary | ICD-10-CM | POA: Diagnosis not present

## 2023-05-11 LAB — BASIC METABOLIC PANEL
Anion gap: 12 (ref 5–15)
Anion gap: 12 (ref 5–15)
BUN: 9 mg/dL (ref 8–23)
BUN: 9 mg/dL (ref 8–23)
CO2: 22 mmol/L (ref 22–32)
CO2: 20 mmol/L — ABNORMAL LOW (ref 22–32)
Calcium: 8 mg/dL — ABNORMAL LOW (ref 8.9–10.3)
Calcium: 7.9 mg/dL — ABNORMAL LOW (ref 8.9–10.3)
Chloride: 106 mmol/L (ref 98–111)
Chloride: 107 mmol/L (ref 98–111)
Creatinine, Ser: 0.76 mg/dL (ref 0.44–1.00)
Creatinine, Ser: 0.67 mg/dL (ref 0.44–1.00)
GFR, Estimated: >60 mL/min (ref >60)
GFR, Estimated: >60 mL/min (ref >60)
Glucose, Bld: 109 mg/dL — ABNORMAL HIGH (ref 70–99)
Glucose, Bld: 97 mg/dL (ref 70–99)
Potassium: 3 mmol/L — ABNORMAL LOW (ref 3.5–5.1)
Potassium: 4 mmol/L (ref 3.5–5.1)
Sodium: 140 mmol/L (ref 135–145)
Sodium: 139 mmol/L (ref 135–145)

## 2023-05-11 LAB — GLUCOSE, CAPILLARY
Glucose-Capillary: 106 mg/dL — ABNORMAL HIGH (ref 70–99)
Glucose-Capillary: 108 mg/dL — ABNORMAL HIGH (ref 70–99)
Glucose-Capillary: 80 mg/dL (ref 70–99)
Glucose-Capillary: 80 mg/dL (ref 70–99)
Glucose-Capillary: 87 mg/dL (ref 70–99)
Glucose-Capillary: 94 mg/dL (ref 70–99)

## 2023-05-11 MED ORDER — LORAZEPAM 2 MG/ML IJ SOLN
0.5000 mg | Freq: Two times a day (BID) | INTRAMUSCULAR | Status: DC | PRN
  Administered 2023-05-11 – 2023-05-14 (×6): 0.5 mg via INTRAVENOUS
  Filled 2023-05-11 (×6): qty 1

## 2023-05-11 MED ORDER — METOPROLOL TARTRATE 5 MG/5ML IV SOLN
5.0000 mg | Freq: Four times a day (QID) | INTRAVENOUS | Status: DC
  Administered 2023-05-11: 5 mg via INTRAVENOUS
  Filled 2023-05-11 (×2): qty 5

## 2023-05-11 MED ORDER — MAGNESIUM SULFATE 2 GM/50ML IV SOLN
2.0000 g | Freq: Once | INTRAVENOUS | Status: AC
  Administered 2023-05-11: 2 g via INTRAVENOUS
  Filled 2023-05-11: qty 50

## 2023-05-11 MED ORDER — POTASSIUM CHLORIDE 10 MEQ/100ML IV SOLN
10.0000 meq | INTRAVENOUS | Status: AC
  Administered 2023-05-11 (×6): 10 meq via INTRAVENOUS
  Filled 2023-05-11 (×6): qty 100

## 2023-05-11 MED ORDER — ENOXAPARIN SODIUM 40 MG/0.4ML IJ SOSY
40.0000 mg | PREFILLED_SYRINGE | INTRAMUSCULAR | Status: DC
  Administered 2023-05-12: 40 mg via SUBCUTANEOUS
  Filled 2023-05-11: qty 0.4

## 2023-05-11 MED ORDER — METOPROLOL TARTRATE 5 MG/5ML IV SOLN
2.5000 mg | Freq: Four times a day (QID) | INTRAVENOUS | Status: DC
  Administered 2023-05-11 – 2023-05-12 (×4): 2.5 mg via INTRAVENOUS
  Filled 2023-05-11 (×3): qty 5

## 2023-05-11 MED ORDER — METOPROLOL TARTRATE 5 MG/5ML IV SOLN
2.5000 mg | Freq: Once | INTRAVENOUS | Status: AC
  Administered 2023-05-11: 2.5 mg via INTRAVENOUS
  Filled 2023-05-11: qty 5

## 2023-05-11 NOTE — Evaluation (Signed)
 Occupational Therapy Evaluation Patient Details Name: Erica [name] MRN: [id number] DOB: Oct 14, 1932 Today's Date: 05/11/2023   History of Present Illness   Pt is 89 year old presented to [facility] ED on  05/08/23 for nausea, vomiting, diarrhea. Pt found to have partial SBO. Difficulty placing NGT led to aspiration and pt intubated and transferred to [facility]. Pt extubated 05/09/23. Pt developed afib with rvr. PMH - rheumatoid arthritis, bipolar     Clinical Impressions PTA, pt lives at an ALF and typically ambulatory with a rollator. Pt presents now with deficits in strength, cognition, endurance and balance. Pt requires Max A for bed mobility, Mod A x 2 (for lines/safety) to pivot to recliner. Pt requires Mod-Total A for ADL completion d/t deficits. As pt is significantly below baseline, recommend continued inpatient follow up therapy, <3 hours/day at DC.  VSS on 5 L O2     If plan is discharge home, recommend the following:   A lot of help with walking and/or transfers;Two people to help with walking and/or transfers;A lot of help with bathing/dressing/bathroom;Two people to help with bathing/dressing/bathroom     Functional Status Assessment   Patient has had a recent decline in their functional status and demonstrates the ability to make significant improvements in function in a reasonable and predictable amount of time.     Equipment Recommendations   Other (comment) (TBD)     Recommendations for Other Services         Precautions/Restrictions   Precautions Precautions: Fall;Other (comment) Precaution/Restrictions Comments: NG tube Restrictions Weight Bearing Restrictions Per Provider Order: No     Mobility Bed Mobility Overal bed mobility: Needs Assistance Bed Mobility: Rolling, Sidelying to Sit, Sit to Sidelying Rolling: Mod assist Sidelying to sit: Max assist     Sit to sidelying: Max assist General bed mobility comments: Cued for log rolling to  decrease pressure sensation on abdomen. hands on step by step cues needed to roll, bring LE off of bed and hand placement to lift trunk    Transfers Overall transfer level: Needs assistance Equipment used: 1 person hand held assist Transfers: Sit to/from Stand, Bed to chair/wheelchair/BSC Sit to Stand: Mod assist     Step pivot transfers: Mod assist, +2 safety/equipment     General transfer comment: RN present to assist with lines (2 IV towers + other lines). Mod A to stand with pt holding to therapist's arms, assist to balance and step to recliner      Balance Overall balance assessment: Needs assistance Sitting-balance support: Bilateral upper extremity supported, Feet unsupported Sitting balance-Leahy Scale: Fair     Standing balance support: Bilateral upper extremity supported Standing balance-Leahy Scale: Poor                             ADL either performed or assessed with clinical judgement   ADL Overall ADL's : Needs assistance/impaired Eating/Feeding: NPO   Grooming: Sitting;Moderate assistance   Upper Body Bathing: Moderate assistance;Sitting   Lower Body Bathing: Maximal assistance;Sitting/lateral leans;Sit to/from stand   Upper Body Dressing : Moderate assistance;Sitting   Lower Body Dressing: Total assistance;Sit to/from stand   Toilet Transfer: Moderate assistance;+2 for safety/equipment;Stand-pivot;BSC/3in1   Toileting- Clothing Manipulation and Hygiene: Total assistance;Sit to/from stand;Sitting/lateral lean               Vision Baseline Vision/History: 1 Wears glasses Ability to See in Adequate Light: 0 Adequate Patient Visual Report: No change from baseline Vision Assessment?: No  apparent visual deficits     Perception         Praxis         Pertinent Vitals/Pain Pain Assessment Pain Assessment: Faces Faces Pain Scale: Hurts a little bit Pain Location: abdomen Pain Descriptors / Indicators: Pressure Pain  Intervention(s): Monitored during session, Limited activity within patient's tolerance     Extremity/Trunk Assessment Upper Extremity Assessment Upper Extremity Assessment: Generalized weakness;Right hand dominant;RUE deficits/detail;LUE deficits/detail RUE Deficits / Details: arthritic changes in B hands, R hand appears worsened. pt denied issues holding to Rollator at home but then reported "I cant hold on to anything"   Lower Extremity Assessment Lower Extremity Assessment: Defer to PT evaluation   Cervical / Trunk Assessment Cervical / Trunk Assessment: Normal   Communication Communication Communication: Impaired Factors Affecting Communication: Hearing impaired   Cognition Arousal: Alert Behavior During Therapy: Flat affect Cognition: Cognition impaired   Orientation impairments: Situation, Time Awareness: Intellectual awareness impaired, Online awareness impaired Memory impairment (select all impairments): Working [employer] fast streamer, Conservation officer, historic buildings Attention impairment (select first level of impairment): Sustained attention Executive functioning impairment (select all impairments): Organization, Sequencing, Reasoning, Problem solving OT - Cognition Comments: pleasant, flat affect mostly, follows directions w/ multimodal cues. memory deficits noted as pt asking same questions at times.decreased insight into needs and problem solving                 Following commands: Impaired Following commands impaired: Follows one step commands with increased time     Cueing  General Comments   Cueing Techniques: Verbal cues;Tactile cues      Exercises     Shoulder Instructions      Home Living Family/patient expects to be discharged to:: Assisted living                             Home Equipment: Rollator (4 wheels)   Additional Comments: [facility]      Prior Functioning/Environment Prior Level of Function : Needs assist              Mobility Comments: Uses rollator      OT Problem List: Decreased strength;Decreased activity tolerance;Impaired balance (sitting and/or standing);Decreased cognition;Decreased safety awareness;Cardiopulmonary status limiting activity;Pain;Decreased coordination   OT Treatment/Interventions: Self-care/ADL training;Therapeutic exercise;DME and/or AE instruction;Energy conservation;Therapeutic activities;Patient/family education;Balance training      OT Goals(Current goals can be found in the care plan section)   Acute Rehab OT Goals Patient Stated Goal: for pressure in abdomen to resolve OT Goal Formulation: With patient/family Time For Goal Achievement: 05/25/23 Potential to Achieve Goals: Good   OT Frequency:  Min 1X/week    Co-evaluation              AM-PAC OT "6 Clicks" Daily Activity     Outcome Measure Help from another person eating meals?: Total Help from another person taking care of personal grooming?: A Lot Help from another person toileting, which includes using toliet, bedpan, or urinal?: Total Help from another person bathing (including washing, rinsing, drying)?: A Lot Help from another person to put on and taking off regular upper body clothing?: A Lot Help from another person to put on and taking off regular lower body clothing?: Total 6 Click Score: 9   End of Session Equipment Utilized During Treatment: Gait belt;Oxygen Nurse Communication: Mobility status  Activity Tolerance: Patient tolerated treatment well Patient left: in chair;with call bell/phone within reach;with chair alarm set;with family/visitor present  OT Visit  Diagnosis: Unsteadiness on feet (R26.81);Other abnormalities of gait and mobility (R26.89);Muscle weakness (generalized) (M62.81)                Time: 1610-9604 OT Time Calculation (min): 30 min Charges:  OT General Charges $OT Visit: 1 Visit OT Evaluation $OT Eval Moderate Complexity: 1 Mod OT Treatments $Therapeutic  Activity: 8-22 mins  Bradd Canary, OTR/L Acute Rehab Services Office: [phone]   Lorre Munroe 05/11/2023, 10:56 AM

## 2023-05-11 NOTE — Progress Notes (Signed)
 Subjective/Chief Complaint: Patient reports some flatus No abdominal pain - still feels "tight" NG output - 700 cc   Objective: Vital signs in last 24 hours: Temp:  [98 F (36.7 C)-100.2 F (37.9 C)] 98.1 F (36.7 C) (02/27 0740) Pulse Rate:  [86-140] 113 (02/27 0645) Resp:  [11-32] 19 (02/27 0645) BP: (89-166)/(58-121) 160/121 (02/27 0630) SpO2:  [88 %-100 %] 95 % (02/27 0645) Weight:  [61.5 kg] 61.5 kg (02/27 0500) Last BM Date :  (PTA)  Intake/Output from previous day: 02/26 0701 - 02/27 0700 In: 3687.2 [I.V.:3187.2; IV Piggyback:500] Out: 1700 [Urine:1000; Emesis/NG output:700] Intake/Output this shift: No intake/output data recorded.  WDWN in NAD Abd - distended; healed incisions with no hernias Non-tender  Lab Results:  Recent Labs    05/09/23 0747 05/09/23 0805 05/09/23 1704 05/10/23 0528  WBC 4.8  --   --  14.1*  HGB 14.7   < > 11.6* 13.4  HCT 45.7   < > 34.0* 40.4  PLT 128*  --   --  98*   < > = values in this interval not displayed.   BMET Recent Labs    05/10/23 0528 05/11/23 0338  NA 136 140  K 4.2 3.0*  CL 107 106  CO2 17* 22  GLUCOSE 94 109*  BUN 18 9  CREATININE 1.12* 0.76  CALCIUM 7.9* 8.0*   PT/INR Recent Labs    05/09/23 0747  LABPROT 15.4*  INR 1.2   ABG Recent Labs    05/09/23 0805 05/09/23 1704  PHART 7.185* 7.309*  HCO3 19.2* 16.7*    Studies/Results: DG Abd Portable 1V-Small Bowel Obstruction Protocol-initial, 8 hr delay Result Date: 05/10/2023 CLINICAL DATA:  Small-bowel obstruction follow-up EXAM: PORTABLE ABDOMEN - 1 VIEW COMPARISON:  Film from earlier in the same day. FINDINGS: Gastric catheter remains in the stomach. Administered contrast lies within the stomach and dilated small bowel centrally. No colonic contrast is noted. 24 hour follow-up film is recommended. No other focal abnormality is seen. IMPRESSION: No colonic contrast seen.  24 hour follow-up film is recommended. Electronically Signed   By: Alcide Clever M.D.   On: 05/10/2023 21:25   DG Abd Portable 1V-Small Bowel Protocol-Position Verification Result Date: 05/10/2023 CLINICAL DATA:  [id number] Encounter for imaging study to confirm nasogastric (NG) tube placement [id number] EXAM: PORTABLE ABDOMEN - 1 VIEW COMPARISON:  X-ray abdomen 05/09/2023 FINDINGS: Enteric tube courses below the hemidiaphragm with tip and side port overlying the expected region of the gastric lumen. Persistent gaseous dilatation of a short loop of small bowel within the left mid to lower abdomen. No radio-opaque calculi or other significant radiographic abnormality are seen. IMPRESSION: 1. Enteric tube in good position. 2. Persistent gaseous dilatation of a short loop of small bowel within the left mid to lower abdomen. Electronically Signed   By: Tish Frederickson M.D.   On: 05/10/2023 10:26    Anti-infectives: Anti-infectives (From admission, onward)    Start     Dose/Rate Route Frequency Ordered Stop   05/09/23 1000  cefTRIAXone (ROCEPHIN) 2 g in sodium chloride 0.9 % 100 mL IVPB        2 g 200 mL/hr over 30 Minutes Intravenous Every 24 hours 05/09/23 0906     05/09/23 1000  metroNIDAZOLE (FLAGYL) IVPB 500 mg        500 mg 100 mL/hr over 60 Minutes Intravenous Every 12 hours 05/09/23 0906     05/09/23 0430  levofloxacin (LEVAQUIN) IVPB 750 mg  750 mg 100 mL/hr over 90 Minutes Intravenous  Once 05/09/23 0424 05/09/23 0602       Assessment/Plan: SBO - partial - CT w/ Distended fluid-filled stomach and small bowel into the pelvis. Distal small bowel decompressed. Findings compatible with distal small bowel obstruction. Large hiatal hernia, fluid-filled - No current indication for emergency surgery - aspiration with NGT placement - intubated and now s/p extubation - febrile and WBC 14 in setting of aspiration event - no new CBC today - NGT with 770ml/24h. No bowel function yet. SBO protocol - 24 hour follow-up film today - Keep K > 4 and Mg > 2 for bowel function  - K 3.0 today - Mobilize for bowel function - Hopefully patient will improve with conservative management. Starting to have some flatus.  If patient fails to improve with conservative management, she may require exploratory surgery during admission   New afib RVR - if anticoagulation needed okay with heparin gtt from surgical standpoint   FEN: NGT LIWS, IVF per primary ID: rocephin/flagyl (aspiration) VTE: okay with chemical ppx or hep gtt from surgical standpoint   Per primary: Acute onset afib RVR - on amio drip HLD IBS RA BPD H/o abdominal hysterectomy, h/o colostomy s/p reversal    LOS: 2 days    Erica [name] 05/11/2023

## 2023-05-11 NOTE — Progress Notes (Signed)
 [facility] ADULT ICU REPLACEMENT PROTOCOL   The patient does apply for the [facility] Adult ICU Electrolyte Replacment Protocol based on the criteria listed below:   1.Exclusion criteria: TCTS, ECMO, Dialysis, and Myasthenia Gravis patients 2. Is GFR >/= 30 ml/min? Yes.    Patient's GFR today is >60 3. Is SCr </= 2? Yes.   Patient's SCr is 0.76 mg/dL 4. Did SCr increase >/= 0.5 in 24 hours? No. 5.Pt's weight >40kg  Yes.   6. Abnormal electrolyte(s):   K 3.0  7. Electrolytes replaced per protocol 8.  Call MD STAT for K+ </= 2.5, Phos </= 1, or Mag </= 1 Physician:  Ignacia Palma R Tawni Melkonian 05/11/2023 5:55 AM

## 2023-05-11 NOTE — Progress Notes (Signed)
   NAME:  Erica [name], MRN:  [id number], DOB:  24-Nov-1932, LOS: 2 ADMISSION DATE:  05/08/2023, CONSULTATION DATE:  05/09/2023 REFERRING MD:  Pilar Plate - ED, CHIEF COMPLAINT:  respiratory failure   History of Present Illness:  89 year old woman who presented with 48h of nausea, vomiting and diarrhea.  Found to have SBO but difficulty passing NGT led to aspiration event. Decision made to intubate to facilitate placement as detailed in Dr Acey Lav note.   Pertinent  Medical History   Past Medical History:  Diagnosis Date   Anxiety    Arthritis    Bipolar disorder (HCC)    Depression    Hyperlipidemia    Hypothyroidism    Hypothyroidism    Irritable bowel syndrome    RA (rheumatoid arthritis) (HCC)    Significant Hospital Events: Including procedures, antibiotic start and stop dates in addition to other pertinent events   2/25 admitted to ICU, was intubated for aspiration during NGT placement. Extubated in evening  2/26 improved pressures, going to try a small dose of metop for rate   Interim History / Subjective:  Between sinus and fib overnight, now sinus   Good Bps  Some flatus  Objective   Blood pressure (!) 173/75, pulse 96, temperature 98.1 F (36.7 C), temperature source Oral, resp. rate (!) 23, height 5' (1.524 m), weight 61.5 kg, SpO2 97%.        Intake/Output Summary (Last 24 hours) at 05/11/2023 1026 Last data filed at 05/11/2023 0800 Gross per 24 hour  Intake 3319.54 ml  Output 1100 ml  Net 2219.54 ml   Filed Weights   05/09/23 0851 05/10/23 0500 05/11/23 0500  Weight: 56.8 kg 58.1 kg 61.5 kg    Examination:  General: elderly F NAD  Neuro: Oriented x 3. Hard of hearing  HEENT: NCAT NGT in place  Lungs: CTAb  CV: rrr cap refill is brisk  GI: round, slightly firm. Non-tender  GU: no foley  MSK: chronic arthritic changes  Skin: c/d/w   Ancillary Tests Personally Reviewed:   Acute hypoxic and hypercarbic resp failure  AKI NAGMA Assessment & Plan:    Acute hypoxic and hypercarbic resp failure, improving  Aspiration into airway  Partial SBO Hiatal hernia New onset Afib RVR , now sinus  Hypokalemia Hx RA Thrombocytopenia DNR -GOC document from 2022 indicating DNR, and "no artificial life support"  Plan: -CCS following, cont NGT, checking another KUB today -starting to have some gas, hopefully will cont to improve w medical mgmnt  -NPO -IS, pulm hygiene -rocephin flagyl for 5d  -wean O2 as able  -decr amio gtt to [facility] metop  -currently in sinus, think this was r/t acute process. will defer AC for now but if goes back into fib, will start hep gtt -60 mEq K 2g mag -- goal is K > 4, Mag >2  -check afternoon BMP and an AM BMP + mag -PRN CBC  -stable to txf our of ICU 2/27    Best Practice (right click and "Reselect all SmartList Selections" daily)   Diet/type: NPO DVT prophylaxis LMWH Pressure ulcer(s): N/A GI prophylaxis: PPI Lines: N/A Foley:  N/A Code Status:  DNR Last date of multidisciplinary goals of care discussion [son updated at bedside 2/26.    CCT na     Tessie Fass MSN, AGACNP-BC [facility] Pulmonary/Critical Care Medicine Amion for pager  05/11/2023, 10:26 AM

## 2023-05-11 NOTE — Progress Notes (Signed)
 Occupational Therapy Treatment  OT in to assist RN w/ pt transfer back to bed. Pt was able to sit up for 1 hour per Ot's encouragement. Pt required Mod A x 2 for lines/safety to pivot back to bed with some minor improvements noted from earlier this AM. Pt left with RN and family present. Continue to recommend postacute rehab stay at DC.   05/11/23 1116  OT Visit Information  Last OT Received On 05/11/23  Assistance Needed +2 (lines/gait attempts)  History of Present Illness Pt is 89 year old presented to [facility] ED on  05/08/23 for nausea, vomiting, diarrhea. Pt found to have partial SBO. Difficulty placing NGT led to aspiration and pt intubated and transferred to [facility]. Pt extubated 05/09/23. Pt developed afib with rvr. PMH - rheumatoid arthritis, bipolar  Precautions  Precautions Fall;Other (comment)  Precaution/Restrictions Comments NG tube  Restrictions  Weight Bearing Restrictions Per Provider Order No  Pain Assessment  Pain Assessment Faces  Faces Pain Scale 2  Pain Location abdomen  Pain Descriptors / Indicators Pressure  Pain Intervention(s) Limited activity within patient's tolerance;Monitored during session;Repositioned  Cognition  Arousal Alert  Behavior During Therapy Flat affect  Cognition Cognition impaired  Orientation impairments Situation;Time  Awareness Intellectual awareness impaired;Online awareness impaired  Attention impairment (select first level of impairment) Sustained attention  Memory impairment (select all impairments) Working memory;Declarative long-term Dealer functioning impairment (select all impairments) Organization;Sequencing;Reasoning;Problem solving  OT - Cognition Comments pleasant, flat affect mostly, follows directions w/ multimodal cues. memory deficits noted as pt asking same questions at times.decreased insight into needs and problem solving  Following Commands  Following commands Impaired  Following commands impaired Follows one  step commands with increased time  Cueing  Cueing Techniques Verbal cues;Tactile cues  Communication  Communication Impaired  Factors Affecting Communication Hearing impaired  Upper Extremity Assessment  Upper Extremity Assessment Generalized weakness;Right hand dominant  RUE Deficits / Details arthritic changes in B hands, R hand appears worsened. pt denied issues holding to Rollator at home but then reported "I cant hold on to anything"  Vision- Assessment  Vision Assessment? No apparent visual deficits  ADL  Overall ADL's  Needs assistance/impaired  Eating/Feeding NPO  Upper Body Dressing  Moderate assistance;Sitting  Upper Body Dressing Details (indicate cue type and reason) Changing gown after transfer back to bed due to pt reporting it feeling wet  Bed Mobility  Overal bed mobility Needs Assistance  Bed Mobility Rolling;Sidelying to Sit  Rolling Mod assist  Sit to sidelying Max assist  General bed mobility comments cued to lay down on side/elbow with OT assisting with LE and repositioning trunk  Transfers  Overall transfer level Needs assistance  Equipment used 1 person hand held assist  Transfers Sit to/from Stand;Bed to chair/wheelchair/BSC  Sit to Stand Mod assist  Step pivot transfers Mod assist;+2 safety/equipment  General transfer comment RN present to assist with lines (2 IV towers + other lines). Mod A to stand with pt holding to therapist's arms, assist to balance and step back to bed  Balance  Overall balance assessment Needs assistance  Sitting-balance support Bilateral upper extremity supported;Feet unsupported  Sitting balance-Leahy Scale Fair  Standing balance support Bilateral upper extremity supported  Standing balance-Leahy Scale Poor  OT - End of Session  Equipment Utilized During Treatment Gait belt;Oxygen  Activity Tolerance Patient tolerated treatment well  Patient left in bed;with call bell/phone within reach;with family/visitor present;with  nursing/sitter in room  Nurse Communication Mobility status  OT Assessment/Plan  OT Visit Diagnosis Unsteadiness on feet (R26.81);Other abnormalities of gait and mobility (R26.89);Muscle weakness (generalized) (M62.81)  OT Frequency (ACUTE ONLY) Min 1X/week  Follow Up Recommendations Skilled nursing-short term rehab (<3 hours/day)  Patient can return home with the following A lot of help with walking and/or transfers;Two people to help with walking and/or transfers;A lot of help with bathing/dressing/bathroom;Two people to help with bathing/dressing/bathroom  OT Equipment Other (comment) (TBD)  AM-PAC OT "6 Clicks" Daily Activity Outcome Measure (Version 2)  Help from another person eating meals? 1  Help from another person taking care of personal grooming? 2  Help from another person toileting, which includes using toliet, bedpan, or urinal? 1  Help from another person bathing (including washing, rinsing, drying)? 2  Help from another person to put on and taking off regular upper body clothing? 2  Help from another person to put on and taking off regular lower body clothing? 1  6 Click Score 9  Progressive Mobility  What is the highest level of mobility based on the progressive mobility assessment? Level 3 (Stands with assist) - Balance while standing  and cannot march in place  Mobility Referral No  Activity Transferred from chair to bed  OT Goal Progression  Progress towards OT goals Progressing toward goals  Acute Rehab OT Goals  Patient Stated Goal get back to bed  OT Goal Formulation With patient/family  Time For Goal Achievement 05/25/23  Potential to Achieve Goals Good  ADL Goals  Pt Will Perform Grooming with min assist;standing  Pt Will Perform Lower Body Bathing with min assist;sit to/from stand;sitting/lateral leans  Pt Will Perform Lower Body Dressing with mod assist;sitting/lateral leans;sit to/from stand  Pt Will Transfer to Toilet with min assist;ambulating  OT Time  Calculation  OT Start Time (ACUTE ONLY) 1029  OT Stop Time (ACUTE ONLY) 1041  OT Time Calculation (min) 12 min  OT General Charges  $OT Visit 1 Visit  OT Treatments  $Therapeutic Activity 8-22 mins

## 2023-05-12 ENCOUNTER — Encounter (HOSPITAL_COMMUNITY): Payer: Self-pay | Admitting: Pulmonary Disease

## 2023-05-12 ENCOUNTER — Inpatient Hospital Stay (HOSPITAL_COMMUNITY): Payer: Medicare Other

## 2023-05-12 DIAGNOSIS — I011 Acute rheumatic endocarditis: Secondary | ICD-10-CM

## 2023-05-12 DIAGNOSIS — K56609 Unspecified intestinal obstruction, unspecified as to partial versus complete obstruction: Secondary | ICD-10-CM | POA: Diagnosis not present

## 2023-05-12 DIAGNOSIS — K219 Gastro-esophageal reflux disease without esophagitis: Secondary | ICD-10-CM

## 2023-05-12 DIAGNOSIS — E876 Hypokalemia: Secondary | ICD-10-CM

## 2023-05-12 DIAGNOSIS — E039 Hypothyroidism, unspecified: Secondary | ICD-10-CM

## 2023-05-12 DIAGNOSIS — I4892 Unspecified atrial flutter: Secondary | ICD-10-CM

## 2023-05-12 DIAGNOSIS — I4891 Unspecified atrial fibrillation: Secondary | ICD-10-CM | POA: Diagnosis not present

## 2023-05-12 DIAGNOSIS — J9601 Acute respiratory failure with hypoxia: Secondary | ICD-10-CM | POA: Diagnosis not present

## 2023-05-12 DIAGNOSIS — I5031 Acute diastolic (congestive) heart failure: Secondary | ICD-10-CM | POA: Insufficient documentation

## 2023-05-12 DIAGNOSIS — J690 Pneumonitis due to inhalation of food and vomit: Secondary | ICD-10-CM | POA: Insufficient documentation

## 2023-05-12 LAB — GLUCOSE, CAPILLARY
Glucose-Capillary: 82 mg/dL (ref 70–99)
Glucose-Capillary: 93 mg/dL (ref 70–99)
Glucose-Capillary: 108 mg/dL — ABNORMAL HIGH (ref 70–99)
Glucose-Capillary: 87 mg/dL (ref 70–99)
Glucose-Capillary: 105 mg/dL — ABNORMAL HIGH (ref 70–99)
Glucose-Capillary: 119 mg/dL — ABNORMAL HIGH (ref 70–99)

## 2023-05-12 LAB — BASIC METABOLIC PANEL
Anion gap: 15 (ref 5–15)
BUN: 10 mg/dL (ref 8–23)
CO2: 19 mmol/L — ABNORMAL LOW (ref 22–32)
Calcium: 7.6 mg/dL — ABNORMAL LOW (ref 8.9–10.3)
Chloride: 105 mmol/L (ref 98–111)
Creatinine, Ser: 0.78 mg/dL (ref 0.44–1.00)
GFR, Estimated: >60 mL/min (ref >60)
Glucose, Bld: 99 mg/dL (ref 70–99)
Potassium: 3.4 mmol/L — ABNORMAL LOW (ref 3.5–5.1)
Sodium: 139 mmol/L (ref 135–145)

## 2023-05-12 LAB — CBC
HCT: 41 % (ref 36.0–46.0)
Hemoglobin: 13.6 g/dL (ref 12.0–15.0)
MCH: 31.9 pg (ref 26.0–34.0)
MCHC: 33.2 g/dL (ref 30.0–36.0)
MCV: 96 fL (ref 80.0–100.0)
Platelets: 107 10*3/uL — ABNORMAL LOW (ref 150–400)
RBC: 4.27 MIL/uL (ref 3.87–5.11)
RDW: 13.4 % (ref 11.5–15.5)
WBC: 15 10*3/uL — ABNORMAL HIGH (ref 4.0–10.5)
nRBC: 0 % (ref 0.0–0.2)

## 2023-05-12 LAB — URINALYSIS, COMPLETE (UACMP) WITH MICROSCOPIC
Bacteria, UA: NONE SEEN
Bilirubin Urine: NEGATIVE
Glucose, UA: NEGATIVE mg/dL
Ketones, ur: 20 mg/dL — AB
Leukocytes,Ua: NEGATIVE
Nitrite: NEGATIVE
Protein, ur: 30 mg/dL — AB
Specific Gravity, Urine: 1.012 (ref 1.005–1.030)
pH: 5 (ref 5.0–8.0)

## 2023-05-12 LAB — ECHOCARDIOGRAM COMPLETE
Area-P 1/2: 2.49 cm2
Height: 60 in
S' Lateral: 1.9 cm
Weight: 2169.33 [oz_av]

## 2023-05-12 LAB — MAGNESIUM: Magnesium: 2 mg/dL (ref 1.7–2.4)

## 2023-05-12 LAB — BRAIN NATRIURETIC PEPTIDE: B Natriuretic Peptide: 344.5 pg/mL — ABNORMAL HIGH (ref 0.0–100.0)

## 2023-05-12 MED ORDER — FUROSEMIDE 10 MG/ML IJ SOLN
20.0000 mg | Freq: Two times a day (BID) | INTRAMUSCULAR | Status: DC
  Administered 2023-05-12 – 2023-05-13 (×2): 20 mg via INTRAVENOUS
  Filled 2023-05-12 (×2): qty 2

## 2023-05-12 MED ORDER — HEPARIN BOLUS VIA INFUSION
3000.0000 [IU] | Freq: Once | INTRAVENOUS | Status: AC
  Administered 2023-05-12: 3000 [IU] via INTRAVENOUS
  Filled 2023-05-12: qty 3000

## 2023-05-12 MED ORDER — METOPROLOL TARTRATE 5 MG/5ML IV SOLN
5.0000 mg | Freq: Four times a day (QID) | INTRAVENOUS | Status: DC
  Administered 2023-05-12 – 2023-05-16 (×12): 5 mg via INTRAVENOUS
  Filled 2023-05-12 (×13): qty 5

## 2023-05-12 MED ORDER — AMIODARONE LOAD VIA INFUSION
150.0000 mg | INTRAVENOUS | Status: AC
  Administered 2023-05-12: 150 mg via INTRAVENOUS
  Filled 2023-05-12: qty 83.34

## 2023-05-12 MED ORDER — POTASSIUM CHLORIDE 10 MEQ/100ML IV SOLN
10.0000 meq | INTRAVENOUS | Status: AC
  Administered 2023-05-12 (×4): 10 meq via INTRAVENOUS
  Filled 2023-05-12 (×4): qty 100

## 2023-05-12 MED ORDER — BISACODYL 10 MG RE SUPP
10.0000 mg | Freq: Once | RECTAL | Status: AC
  Administered 2023-05-12: 10 mg via RECTAL
  Filled 2023-05-12: qty 1

## 2023-05-12 MED ORDER — HEPARIN (PORCINE) 25000 UT/250ML-% IV SOLN
1100.0000 [IU]/h | INTRAVENOUS | Status: DC
  Administered 2023-05-12: 850 [IU]/h via INTRAVENOUS
  Administered 2023-05-13: 900 [IU]/h via INTRAVENOUS
  Filled 2023-05-12 (×2): qty 250

## 2023-05-12 MED ORDER — BOOST / RESOURCE BREEZE PO LIQD CUSTOM
1.0000 | Freq: Three times a day (TID) | ORAL | Status: DC
  Administered 2023-05-13: 1 via ORAL

## 2023-05-12 NOTE — Progress Notes (Signed)
 PT Cancellation Note  Patient Details Name: Erica [name] MRN: [id number] DOB: Sep 20, 1932   Cancelled Treatment:    Reason Eval/Treat Not Completed: Patient at procedure or test/unavailable. Lab with pt >20 minutes. Will continue attempts.   Angelina Ok [facility] 05/12/2023, 11:18 AM  Skip Mayer PT Acute [employer] [phone]

## 2023-05-12 NOTE — Consult Note (Addendum)
 Cardiology Consultation   Patient ID: Erica [name] MRN: [id number]; DOB: 1933-02-23  Admit date: 05/08/2023 Date of Consult: 05/12/2023  PCP:  Aliene Beams, MD   [facility] Providers Cardiologist:  Rollene Rotunda, MD    Patient Profile:   Erica [name] with a hx of small bowel obstruction, hypertension, rheumatoid arthritis, bipolar disorder who is being seen 05/12/2023 for the evaluation of A-fib with RVR at the request of Ramiro Harvest MD.  History of Present Illness:   Ms. Reinheimer presented to [facility] emergency department on 05/08/2023 after having nausea, vomiting, diarrhea for 2 days.  CT abdomen and pelvis found a small bowel obstruction and a large fluid-filled hiatal hernia.  As nursing was placing the NG tube the patient aspirated and was required to be intubated.  After being intubated, the NG tube was placed. she was transferred to [facility] and was admitted to critical care service for respiratory failure. She was started on antibiotics for aspiration pneumonia.  Was on levophed in the ICU.  After being extubated on 05/09/2023, patient was found to be in A-fib with RVR with rates in the 120s to 130s.  Was started on IV Amio infusion for rate control.  On 05/11/2023 IV amio decreased and iv metoprolol was started. Patient was back in sinus rhythm and was transferred out of the ICU.  Anti-Coagulation was not started as the A-fib was believed to be related to the intubation and aspiration pneumonia. On 2/28 she continued to be in sinus rhythm.  2/28 a TTE was performed the EF was estimated at 70 to 75% with no regional wall motion abnormalities, G1DD, moderate mitral annular calcification, mild mitral regurg.  Today shortness of breath worsened, chest x-ray today showed progressive airspace disease with either bilateral pneumonia or edema progressive bilateral effusions, BNP was 344.5, on 5 L high flow nasal cannula, during this hospitalization I's and O's  are up about 8 L, Per telemetry, patient has been in and out of atrial fibrillation all day. Currently in afib with HR in the 120s-150s.   On physical exam the family affirmed the above history. Denies any past history of atrial fibrillation. Family denies any past cardiac history. Family reports that she hasd not complained of chest pain, but has had worsening of shortness of breath over the past day. On exam she was on 5 L/min of supplemental oxygen.   No significant prior cardiac history  Past Medical History:  Diagnosis Date   Anxiety    Arthritis    Bipolar disorder (HCC)    Depression    Hyperlipidemia    Hypothyroidism    Irritable bowel syndrome    RA (rheumatoid arthritis) (HCC)     Past Surgical History:  Procedure Laterality Date   ABDOMINAL HYSTERECTOMY     Bone Spurs  08/07/13   Right Shoulder   COLONOSCOPY     COLONOSCOPY N/A 05/08/2012   Procedure: COLONOSCOPY;  Surgeon: Malissa Hippo, MD;  Location: [facility];  Service: Endoscopy;  Laterality: N/A;  730   COLOSTOMY     ESOPHAGOGASTRODUODENOSCOPY N/A 11/04/2013   Procedure: ESOPHAGOGASTRODUODENOSCOPY (EGD);  Surgeon: Malissa Hippo, MD;  Location: [facility];  Service: Endoscopy;  Laterality: N/A;  730   EYE SURGERY  cataract x 2   MALONEY DILATION N/A 11/04/2013   Procedure: MALONEY DILATION;  Surgeon: Malissa Hippo, MD;  Location: [facility];  Service: Endoscopy;  Laterality: N/A;   UPPER  GASTROINTESTINAL ENDOSCOPY         Inpatient Medications: Scheduled Meds:  Chlorhexidine Gluconate Cloth  6 each Topical Daily   feeding supplement  1 Container Oral TID BM   furosemide  20 mg Intravenous Q12H   insulin aspart  0-9 Units Subcutaneous Q4H   metoprolol tartrate  5 mg Intravenous Q6H   mouth rinse  15 mL Mouth Rinse 4 times per day   pantoprazole (PROTONIX) IV  40 mg Intravenous QHS   Continuous Infusions:  amiodarone 30 mg/hr (05/12/23 1435)   cefTRIAXone (ROCEPHIN)  IV Stopped (05/12/23  1119)   heparin 850 Units/hr (05/12/23 1705)   metronidazole Stopped (05/12/23 1238)   PRN Meds: docusate sodium, LORazepam, mouth rinse, polyethylene glycol, prochlorperazine  Allergies:    Allergies  Allergen Reactions   Penicillins Itching and Rash   Hydrocodone Other (See Comments)    MAO-I ; cannot take medications.    Morphine And Codeine     Unable to take due to MAO-I   Morphine Sulfate Other (See Comments)   Sulfamethoxazole-Trimethoprim Nausea Only, Other (See Comments) and Nausea And Vomiting    And diarrhea.  Other Reaction(s): GI Intolerance    Social History:   Social History   Socioeconomic History   Marital status: Widowed    Spouse name: Not on file   Number of children: 3   Years of education: Not on file   Highest education level: Not on file  Occupational History   Not on file  Tobacco Use   Smoking status: Former    Current packs/day: 0.00    Average packs/day: 1.5 packs/day for 27.0 years (40.5 ttl pk-yrs)    Types: Cigarettes    Start date: 04/18/1954    Quit date: 04/18/1981    Years since quitting: 42.0   Smokeless tobacco: Former    Quit date: 11/27/1980  Vaping Use   Vaping status: Never Used  Substance and Sexual Activity   Alcohol use: Yes    Alcohol/week: 1.0 standard drink of alcohol    Types: 1 Glasses of wine per week   Drug use: No   Sexual activity: Not Currently    Partners: Male    Birth control/protection: Post-menopausal, Surgical    Comment: hyst  Other Topics Concern   Not on file  Social History Narrative   Not on file   Social Drivers of Health   Financial Resource Strain: Low Risk  (02/13/2020)   Received from [facility], [facility]   Overall Financial Resource Strain (CARDIA)    Difficulty of Paying Living Expenses: Not hard at all  Food Insecurity: Not on file  Transportation Needs: Not on file  Physical Activity: Not on file  Stress: Stress Concern Present (02/13/2020)   Received from [facility],  [facility]   [employer] [facility] - Occupational Stress Questionnaire    Feeling of Stress : To some extent  Social Connections: Unknown (07/27/2021)   Received from [facility], [facility]   Social Network    Social Network: Not on file  Intimate Partner Violence: Unknown (06/18/2021)   Received from [facility], [facility]   HITS    Physically Hurt: Not on file    Insult or Talk Down To: Not on file    Threaten Physical Harm: Not on file    Scream or Curse: Not on file    Family History:    Family History  Problem Relation Age of Onset   Colon cancer Mother  Bipolar disorder Mother    Anxiety disorder Mother    Atrial fibrillation Son    Depression Maternal Grandmother      ROS:  Please see the history of present illness.   All other ROS reviewed and negative.     Physical Exam/Data:   Vitals:   05/12/23 0741 05/12/23 0919 05/12/23 1130 05/12/23 1539  BP:      Pulse:  100    Resp:  (!) 27    Temp: 98.5 F (36.9 C)  98 F (36.7 C) 98.1 F (36.7 C)  TempSrc: Oral  Axillary Oral  SpO2:  92%    Weight:      Height:        Intake/Output Summary (Last 24 hours) at 05/12/2023 1713 Last data filed at 05/12/2023 1400 Gross per 24 hour  Intake 1524.78 ml  Output --  Net 1524.78 ml      05/11/2023    5:00 AM 05/10/2023    5:00 AM 05/09/2023    8:51 AM  Last 3 Weights  Weight (lbs) 135 lb 9.3 oz 128 lb 1.4 oz 125 lb 3.5 oz  Weight (kg) 61.5 kg 58.1 kg 56.8 kg     Body mass index is 26.48 kg/m.  General:  malnourished, was unable to answer questions during visit. Was able to ask for jello and to follow dirrections HEENT: normal Neck: 3+ cm JVD Vascular: No carotid bruits; Distal pulses 2+ bilaterally Cardiac:  Irregularly irregular, tachycardic  Lungs:  Bilateral crackles Abd: soft, nontender, no hepatomegaly  Ext: 1+ edema in BLE Musculoskeletal:  No deformities Skin: warm and dry  Neuro:  CNs 2-12 intact, no focal  abnormalities noted  EKG:  The EKG was personally reviewed and demonstrates:  atrial flutter with RVR with no acute ischemic changes. Telemetry:  Telemetry was personally reviewed and demonstrates:  Sinus tachycardia earlier today, about 11am went into atypical atrial flutter with RVR with a variable AV block  Relevant CV Studies: IMPRESSIONS     1. Left ventricular ejection fraction, by estimation, is 70 to 75%. Left  ventricular ejection fraction by PLAX is 75 %. The left ventricle has  hyperdynamic function. The left ventricle has no regional wall motion  abnormalities. Left ventricular  diastolic parameters are consistent with Grade I diastolic dysfunction  (impaired relaxation).   2. Right ventricular systolic function is normal. The right ventricular  size is normal. There is normal pulmonary artery systolic pressure. The  estimated right ventricular systolic pressure is 29.8 mmHg.   3. The mitral valve is normal in structure. Mild mitral valve  regurgitation. Moderate mitral annular calcification.   4. The aortic valve is tricuspid. Aortic valve regurgitation is not  visualized. Aortic valve sclerosis is present, with no evidence of aortic  valve stenosis.   5. The inferior vena cava is normal in size with greater than 50%  respiratory variability, suggesting right atrial pressure of 3 mmHg.   FINDINGS   Left Ventricle: Left ventricular ejection fraction, by estimation, is 70  to 75%. Left ventricular ejection fraction by PLAX is 75 %. The left  ventricle has hyperdynamic function. The left ventricle has no regional  wall motion abnormalities. The left  ventricular internal cavity size was normal in size. There is no left  ventricular hypertrophy. Left ventricular diastolic parameters are  consistent with Grade I diastolic dysfunction (impaired relaxation).   Right Ventricle: The right ventricular size is normal. No increase in  right ventricular wall thickness. Right  ventricular systolic  function is  normal. There is normal pulmonary artery systolic pressure. The tricuspid  regurgitant velocity is 2.59 m/s, and   with an assumed right atrial pressure of 3 mmHg, the estimated right  ventricular systolic pressure is 29.8 mmHg.   Left Atrium: Left atrial size was normal in size.   Right Atrium: Right atrial size was normal in size.   Pericardium: Trivial pericardial effusion is present.   Mitral Valve: The mitral valve is normal in structure. Moderate mitral  annular calcification. Mild mitral valve regurgitation.   Tricuspid Valve: The tricuspid valve is normal in structure. Tricuspid  valve regurgitation is trivial.   Aortic Valve: The aortic valve is tricuspid. Aortic valve regurgitation is  not visualized. Aortic valve sclerosis is present, with no evidence of  aortic valve stenosis.   Pulmonic Valve: The pulmonic valve was normal in structure. Pulmonic valve  regurgitation is trivial.   Aorta: The aortic root and ascending aorta are structurally normal, with  no evidence of dilitation.   Venous: The inferior vena cava is normal in size with greater than 50%  respiratory variability, suggesting right atrial pressure of 3 mmHg.   IAS/Shunts: No atrial level shunt detected by color flow Doppler.   Laboratory Data:  High Sensitivity Troponin:  No results for input(s): "TROPONINIHS" in the last 720 hours.   Chemistry Recent Labs  Lab 05/09/23 0747 05/09/23 0805 05/10/23 0528 05/11/23 0338 05/11/23 1708 05/12/23 0438  NA 136   < > 136 140 139 139  K 3.2*   < > 4.2 3.0* 4.0 3.4*  CL 108  --  107 106 107 105  CO2 18*  --  17* 22 20* 19*  GLUCOSE 134*  --  94 109* 97 99  BUN 25*  --  18 9 9 10   CREATININE 1.06*  --  1.12* 0.76 0.67 0.78  CALCIUM 8.0*  --  7.9* 8.0* 7.9* 7.6*  MG 1.7  --  2.2  --   --  2.0  GFRNONAA 51*  --  47* >60 >60 >60  ANIONGAP 10  --  12 12 12 15    < > = values in this interval not displayed.    Recent  Labs  Lab 05/08/23 2120 05/09/23 0747  PROT 8.1 7.0  ALBUMIN 4.6 3.3*  AST 17 26  ALT 13 14  ALKPHOS 67 50  BILITOT 0.4 0.7   Lipids No results for input(s): "CHOL", "TRIG", "HDL", "LABVLDL", "LDLCALC", "CHOLHDL" in the last 168 hours.  Hematology Recent Labs  Lab 05/09/23 0747 05/09/23 0805 05/09/23 1704 05/10/23 0528 05/12/23 1151  WBC 4.8  --   --  14.1* 15.0*  RBC 4.58  --   --  4.06 4.27  HGB 14.7   < > 11.6* 13.4 13.6  HCT 45.7   < > 34.0* 40.4 41.0  MCV 99.8  --   --  99.5 96.0  MCH 32.1  --   --  33.0 31.9  MCHC 32.2  --   --  33.2 33.2  RDW 11.9  --   --  12.3 13.4  PLT 128*  --   --  98* 107*   < > = values in this interval not displayed.   Thyroid No results for input(s): "TSH", "FREET4" in the last 168 hours.  BNP Recent Labs  Lab 05/12/23 1151  BNP 344.5*    DDimer No results for input(s): "DDIMER" in the last 168 hours.   Radiology/Studies:  DG CHEST PORT 1 VIEW  Result Date: 05/12/2023 CLINICAL DATA:  Shortness of breath. EXAM: PORTABLE CHEST 1 VIEW COMPARISON:  One-view chest x-ray 05/09/2023 FINDINGS: The patient has been extubated.  Gastric tube was removed. The heart size is normal. Atherosclerotic changes are present at the aortic arch progressive interstitial and airspace disease is present bilaterally. Progressive bilateral effusions are present. IMPRESSION: 1. Progressive interstitial and airspace disease bilaterally consistent with progressive bilateral pneumonia and edema. 2. Progressive bilateral effusions. Electronically Signed   By: Marin Roberts M.D.   On: 05/12/2023 14:17   DG Abd Portable 1V Result Date: 05/12/2023 CLINICAL DATA:  Follow up small bowel obstruction. EXAM: PORTABLE ABDOMEN - 1 VIEW COMPARISON:  Radiographs 05/11/2023 and 05/10/2023.  CT 05/08/2023. FINDINGS: 0830 hours. 2 portable supine views of the abdomen are submitted. Previously administered enteric contrast remains within nondilated colon. No significant retained  contrast within the small bowel. There are few mildly dilated loops of small bowel centrally. No evidence of contrast extravasation. Multiple surgical clips are noted. Enteric tube has been removed. IMPRESSION: Previously administered enteric contrast remains within nondilated colon. No significant retained contrast within the small bowel. Electronically Signed   By: Carey Bullocks M.D.   On: 05/12/2023 12:30   ECHOCARDIOGRAM COMPLETE Result Date: 05/12/2023    ECHOCARDIOGRAM REPORT   Patient Name:   Erica [name] Date of Exam: 05/12/2023 Medical Rec #:  [id number]       Height:       60.0 in Accession #:    [id number]      Weight:       135.6 lb Date of Birth:  1932/11/21       BSA:          1.582 m Patient Age:    89 years        BP:           158/93 mmHg Patient Gender: F               HR:           106 bpm. Exam Location:  Inpatient Procedure: 2D Echo, Cardiac Doppler and Color Doppler (Both Spectral and Color            Flow Doppler were utilized during procedure). Indications:    I48.91* Unspecified atrial fibrillation  History:        Patient has prior history of Echocardiogram examinations, most                 recent 05/01/2017. Arrythmias:Atrial Fibrillation; Risk                 Factors:Hypertension and Dyslipidemia.  Sonographer:    Irving Burton Senior RDCS Referring Phys: [id number] DANIEL V THOMPSON IMPRESSIONS  1. Left ventricular ejection fraction, by estimation, is 70 to 75%. Left ventricular ejection fraction by PLAX is 75 %. The left ventricle has hyperdynamic function. The left ventricle has no regional wall motion abnormalities. Left ventricular diastolic parameters are consistent with Grade I diastolic dysfunction (impaired relaxation).  2. Right ventricular systolic function is normal. The right ventricular size is normal. There is normal pulmonary artery systolic pressure. The estimated right ventricular systolic pressure is 29.8 mmHg.  3. The mitral valve is normal in structure. Mild mitral valve  regurgitation. Moderate mitral annular calcification.  4. The aortic valve is tricuspid. Aortic valve regurgitation is not visualized. Aortic valve sclerosis is present, with no evidence of aortic valve stenosis.  5. The inferior vena cava is normal in size with greater than  50% respiratory variability, suggesting right atrial pressure of 3 mmHg. FINDINGS  Left Ventricle: Left ventricular ejection fraction, by estimation, is 70 to 75%. Left ventricular ejection fraction by PLAX is 75 %. The left ventricle has hyperdynamic function. The left ventricle has no regional wall motion abnormalities. The left ventricular internal cavity size was normal in size. There is no left ventricular hypertrophy. Left ventricular diastolic parameters are consistent with Grade I diastolic dysfunction (impaired relaxation). Right Ventricle: The right ventricular size is normal. No increase in right ventricular wall thickness. Right ventricular systolic function is normal. There is normal pulmonary artery systolic pressure. The tricuspid regurgitant velocity is 2.59 m/s, and  with an assumed right atrial pressure of 3 mmHg, the estimated right ventricular systolic pressure is 29.8 mmHg. Left Atrium: Left atrial size was normal in size. Right Atrium: Right atrial size was normal in size. Pericardium: Trivial pericardial effusion is present. Mitral Valve: The mitral valve is normal in structure. Moderate mitral annular calcification. Mild mitral valve regurgitation. Tricuspid Valve: The tricuspid valve is normal in structure. Tricuspid valve regurgitation is trivial. Aortic Valve: The aortic valve is tricuspid. Aortic valve regurgitation is not visualized. Aortic valve sclerosis is present, with no evidence of aortic valve stenosis. Pulmonic Valve: The pulmonic valve was normal in structure. Pulmonic valve regurgitation is trivial. Aorta: The aortic root and ascending aorta are structurally normal, with no evidence of dilitation. Venous:  The inferior vena cava is normal in size with greater than 50% respiratory variability, suggesting right atrial pressure of 3 mmHg. IAS/Shunts: No atrial level shunt detected by color flow Doppler.  LEFT VENTRICLE PLAX 2D LV EF:         Left            Diastology                ventricular     LV e' medial:    9.14 cm/s                ejection        LV E/e' medial:  8.4                fraction by     LV e' lateral:   9.79 cm/s                PLAX is 75      LV E/e' lateral: 7.8                %. LVIDd:         3.30 cm LVIDs:         1.90 cm LV PW:         0.80 cm LV IVS:        1.00 cm LVOT diam:     1.90 cm LV SV:         45 LV SV Index:   29 LVOT Area:     2.84 cm  RIGHT VENTRICLE RV S prime:     15.10 cm/s TAPSE (M-mode): 1.9 cm LEFT ATRIUM             Index        RIGHT ATRIUM          Index LA diam:        2.80 cm 1.77 cm/m   RA Area:     8.27 cm LA Vol (A2C):   22.2 ml 14.03 ml/m  RA Volume:   13.60 ml 8.59 ml/m LA Vol (  A4C):   36.3 ml 22.94 ml/m LA Biplane Vol: 30.3 ml 19.15 ml/m  AORTIC VALVE LVOT Vmax:   94.20 cm/s LVOT Vmean:  61.700 cm/s LVOT VTI:    0.160 m  AORTA Ao Root diam: 2.70 cm Ao Asc diam:  2.60 cm MITRAL VALVE                TRICUSPID VALVE MV Area (PHT): 2.49 cm     TR Peak grad:   26.8 mmHg MV Decel Time: 305 msec     TR Vmax:        259.00 cm/s MV E velocity: 76.50 cm/s MV A velocity: 106.00 cm/s  SHUNTS MV E/A ratio:  0.72         Systemic VTI:  0.16 m                             Systemic Diam: 1.90 cm Clearnce Hasten Electronically signed by Clearnce Hasten Signature Date/Time: 05/12/2023/10:04:37 AM    Final    DG Abd Portable 1V Result Date: 05/11/2023 CLINICAL DATA:  Small-bowel obstruction EXAM: PORTABLE ABDOMEN - 1 VIEW COMPARISON:  X-ray 05/10/2023 and older FINDINGS: Enteric tube overlying the presumed distal stomach in the right upper abdomen. Minimal small bowel gas. Scattered surgical changes. Contrast now seen in nondilated loops of colon diffusely. Curvature and  advanced degenerative changes along the spine. Osteopenia. Overlapping cardiac leads. No obvious free air on this portable supine radiograph IMPRESSION: Contrast now seen along nondilated colon diffusely. Please correlate with the time course of contrast administration. Electronically Signed   By: Karen Kays M.D.   On: 05/11/2023 13:53   DG Abd Portable 1V-Small Bowel Obstruction Protocol-initial, 8 hr delay Result Date: 05/10/2023 CLINICAL DATA:  Small-bowel obstruction follow-up EXAM: PORTABLE ABDOMEN - 1 VIEW COMPARISON:  Film from earlier in the same day. FINDINGS: Gastric catheter remains in the stomach. Administered contrast lies within the stomach and dilated small bowel centrally. No colonic contrast is noted. 24 hour follow-up film is recommended. No other focal abnormality is seen. IMPRESSION: No colonic contrast seen.  24 hour follow-up film is recommended. Electronically Signed   By: Alcide Clever M.D.   On: 05/10/2023 21:25   DG Abd Portable 1V-Small Bowel Protocol-Position Verification Result Date: 05/10/2023 CLINICAL DATA:  [id number] Encounter for imaging study to confirm nasogastric (NG) tube placement [id number] EXAM: PORTABLE ABDOMEN - 1 VIEW COMPARISON:  X-ray abdomen 05/09/2023 FINDINGS: Enteric tube courses below the hemidiaphragm with tip and side port overlying the expected region of the gastric lumen. Persistent gaseous dilatation of a short loop of small bowel within the left mid to lower abdomen. No radio-opaque calculi or other significant radiographic abnormality are seen. IMPRESSION: 1. Enteric tube in good position. 2. Persistent gaseous dilatation of a short loop of small bowel within the left mid to lower abdomen. Electronically Signed   By: Tish Frederickson M.D.   On: 05/10/2023 10:26   DG Abdomen 1 View Result Date: 05/09/2023 CLINICAL DATA:  Post intubation. Nausea, vomiting, and diarrhea for 2 days, small-bowel obstruction. EXAM: ABDOMEN - 1 VIEW COMPARISON:  05/09/2023,  05/08/2023. FINDINGS: Multiple loops of distended small bowel are present in the abdomen measuring up to 4.8 cm in diameter. An enteric tube terminates in the stomach. Mild airspace disease is noted at the lung bases. There is levoscoliosis with degenerative changes in the lumbar spine. Surgical changes are present in the left lower quadrant. Excreted contrast is  present in the urinary bladder. There is a small outpouching of the bladder on the right, possible diverticulum. IMPRESSION: 1. Slight interval worsening of distended loops of small bowel in the abdomen, compatible with small-bowel obstruction. 2. Mild airspace disease at the lung bases, possible atelectasis or infiltrate. Electronically Signed   By: Thornell Sartorius M.D.   On: 05/09/2023 07:14   DG Chest Portable 1 View Result Date: 05/09/2023 CLINICAL DATA:  Intubation. EXAM: PORTABLE CHEST 1 VIEW COMPARISON:  05/01/2020 FINDINGS: Endotracheal tube with tip 2.5 cm above the carina. The enteric tube at least reaches the stomach. Indistinct airspace density in the lower lungs. No pleural effusion, pneumothorax, or collapse. Normal heart size and mediastinal contours. IMPRESSION: 1. Unremarkable hardware as described. 2. Bilateral pulmonary infiltrate/pneumonia. Electronically Signed   By: Tiburcio Pea M.D.   On: 05/09/2023 07:11   DG Abdomen 1 View Result Date: 05/09/2023 CLINICAL DATA:  NG tube placement EXAM: ABDOMEN - 1 VIEW COMPARISON:  CT abdomen/pelvis dated 05/08/2023 FINDINGS: Moderate hiatal hernia. Enteric tube terminates in the distal esophagus just above the level of the hiatal hernia. IMPRESSION: Enteric tube terminates in the distal esophagus just above the level of a moderate hiatal hernia. Advancement is recommended. Electronically Signed   By: Charline Bills M.D.   On: 05/09/2023 03:31   CT ABDOMEN PELVIS W CONTRAST Result Date: 05/09/2023 CLINICAL DATA:  Abdominal pain EXAM: CT ABDOMEN AND PELVIS WITH CONTRAST TECHNIQUE:  Multidetector CT imaging of the abdomen and pelvis was performed using the standard protocol following bolus administration of intravenous contrast. RADIATION DOSE REDUCTION: This exam was performed according to the departmental dose-optimization program which includes automated exposure control, adjustment of the mA and/or kV according to patient size and/or use of iterative reconstruction technique. CONTRAST:  75mL OMNIPAQUE IOHEXOL 300 MG/ML  SOLN COMPARISON:  05/02/2020 FINDINGS: Lower chest: Large hiatal hernia.  No acute findings. Hepatobiliary: No focal hepatic abnormality. Gallbladder unremarkable. Pancreas: No focal abnormality or ductal dilatation. Spleen: No focal abnormality.  Normal size. Adrenals/Urinary Tract: No adrenal abnormality. No focal renal abnormality. No stones or hydronephrosis. Urinary bladder is unremarkable. Stomach/Bowel: Dilated fluid-filled small bowel loops into the pelvis. Distal small bowel is decompressed. Findings compatible with distal small bowel obstruction. Large bowel unremarkable. Stomach is fluid-filled and distended. Vascular/Lymphatic: Aortoiliac atherosclerosis. No evidence of aneurysm or adenopathy. Reproductive: Prior hysterectomy.  No adnexal masses. Other: No free fluid or free air. Musculoskeletal: No acute bony abnormality. IMPRESSION: Distended fluid-filled stomach and small bowel into the pelvis. Distal small bowel decompressed. Findings compatible with distal small bowel obstruction. Large hiatal hernia, fluid-filled. Electronically Signed   By: Charlett Nose M.D.   On: 05/09/2023 00:46     Assessment and Plan:   Atypical A-flutter with RVR CHA2DS2/VAS 4  (Age 5, Female 1,  HTN 1) Initially went into A-fib with RVR after being extubated on 2/27.  Was started on IV amiodarone and IV metoprolol, but was not started on anticoagulation as it was thought that her illness was driving the afib.  She has been going in and out of afib since  - As patient  continues to be in and out of atrial fibrillation and is on IV amiodarone- Start IV heparin. CHADS-VASc 4 (HTN, age x2, gender)  - Re bolus with IV amiodarone later this evening when she is on heparin  - BP stable. Increase IV metoprolol to 5 mg q6hrs  - Check TSH in the AM   Heart failure with preserved ejection fraction Today, patient  developed worsening shortness of breath. chest x-ray today showed progressive airspace disease with either bilateral pneumonia or edema progressive bilateral effusions, BNP was 344.5, currently on 5 L high flow nasal cannula, during this hospitalization I's and O's are up about 8 L. 2/28 a TTE was performed the EF was estimated at 70 to 75% with no regional wall motion abnormalities, G1DD, moderate mitral annular calcification, mild mitral regurg.  - Agree with IV Lasix 20 mg. Strict I/Os, daily weights.  - Patient is receiving K supplementation. Follow BMP in the AM   Acute hypoxic and hypercarbic respiratory failure - currently on 5 L high flow nasal cannula Manage per primary, likely combination of CHF/bilateral effusions as well as aspiration pneumonia. Diurese as above. Also on Abx   Aspiration pneumonia  Managed per primary  Small bowel obstruction Managed per primary  Risk Assessment/Risk Scores:    CHA2DS2-VASc Score = 4  This indicates a 4.8% annual risk of stroke. The patient's score is based upon: CHF History: 0 HTN History: 1 Diabetes History: 0 Stroke History: 0 Vascular Disease History: 0 Age Score: 2 Gender Score: 1   For questions or updates, please contact [facility] Please consult [email or web address] for contact info under    Signed, Jonita Albee, PA-C  05/12/2023 5:13 PM   History and all data above reviewed.  Patient examined.  I agree with the findings as above.  The patient had paroxysmal atrial fib with fib earlier during this admission that converted on IV amiodarone.  This was discontinued and he  subsequently had ablation..  Back on amiodarone bolus.  Ventricular rate is rapid currently.  Confused.  She is worried about if she is having her aortic catheter.  Describing new shortness of breath, chest pain.  She is on 5 L.  The patient exam reveals COR: Irregular, distant heart sounds, no murmurs,  Lungs: Clear videogames please force track: Scattered wheezes,  Abd: Mildly distended, positive bowel sounds without rebound or guarding,, Ext mild bilateral lower extremity swelling.  All available labs, radiology testing, previous records reviewed. Agree with documented assessment and plan.  Atrial fibrillation: He is now on heparin, IV amiodarone bolus.  She gets scheduled having metoprolol switched to p.o. her intake improves.  I suspect she will likely convert back to sinus rhythm but she will need long-term anticoagulation.  Hypoxemia:   This is post extubation.  Agree with IV Lasix.  She is significantly up on her I/O through this hospitalization.  I would continue with IV diuresis as her BP and creat allow.  Fayrene Fearing Lucette Kratz  5:20 PM  05/12/2023

## 2023-05-12 NOTE — Progress Notes (Signed)
 Physical Therapy Treatment Patient Details Name: Erica [name] MRN: [id number] DOB: 08-26-1932 Today's Date: 05/12/2023   History of Present Illness Pt is 89 year old presented to [facility] ED on  05/08/23 for nausea, vomiting, diarrhea. Pt found to have partial SBO. Difficulty placing NGT led to aspiration and pt intubated and transferred to [facility]. Pt extubated 05/09/23. Pt developed afib with rvr. PMH - rheumatoid arthritis, bipolar    PT Comments  Pt is limited by fatigue at this time, in afib throughout session. Pt continues to require physical assistance for bed mobility and transfers, fatiguing very quickly after transfer to recliner. PT will continue to follow in an effort to increase activity tolerance and strength. Patient will benefit from continued inpatient follow up therapy, <3 hours/day.    If plan is discharge home, recommend the following: A lot of help with walking and/or transfers;A lot of help with bathing/dressing/bathroom;Assistance with cooking/housework;Direct supervision/assist for medications management;Direct supervision/assist for financial management;Assist for transportation;Help with stairs or ramp for entrance;Supervision due to cognitive status   Can travel by private vehicle     No  Equipment Recommendations  Hospital bed;BSC/3in1;Wheelchair (measurements PT) (needs today, hopeful for progression)    Recommendations for Other Services       Precautions / Restrictions Precautions Precautions: Fall;Other (comment) Recall of Precautions/Restrictions: Impaired Restrictions Weight Bearing Restrictions Per Provider Order: No     Mobility  Bed Mobility Overal bed mobility: Needs Assistance Bed Mobility: Supine to Sit     Supine to sit: HOB elevated, Max assist          Transfers Overall transfer level: Needs assistance Equipment used: 1 person hand held assist Transfers: Sit to/from Stand, Bed to chair/wheelchair/BSC Sit to Stand: Mod  assist Stand pivot transfers: Mod assist              Ambulation/Gait                   Stairs             Wheelchair Mobility     Tilt Bed    Modified Rankin (Stroke Patients Only)       Balance Overall balance assessment: Needs assistance Sitting-balance support: No upper extremity supported, Feet unsupported Sitting balance-Leahy Scale: Fair     Standing balance support: Bilateral upper extremity supported, Reliant on assistive device for balance Standing balance-Leahy Scale: Poor Standing balance comment: modA                            Communication Communication Communication: Impaired (voice is very hoarse) Factors Affecting Communication: Hearing impaired  Cognition Arousal: Alert Behavior During Therapy: Anxious   PT - Cognitive impairments: Awareness                         Following commands: Intact Following commands impaired: Follows one step commands with increased time    Cueing Cueing Techniques: Verbal cues, Tactile cues, Gestural cues  Exercises      General Comments General comments (skin integrity, edema, etc.): pt on 5L HFNC upon PT arrival, in afib, rates from 120-130s at rest, peak at 161 during session      Pertinent Vitals/Pain Pain Assessment Pain Assessment: Faces Faces Pain Scale: Hurts whole lot Pain Location: suprapubic region Pain Descriptors / Indicators: Burning Pain Intervention(s): Monitored during session    Home Living  Prior Function            PT Goals (current goals can now be found in the care plan section) Acute Rehab PT Goals Patient Stated Goal: to improve activity tolerance, reduce pain Progress towards PT goals: Not progressing toward goals - comment (limited by fatigue and Afib)    Frequency    Min 1X/week      PT Plan      Co-evaluation              AM-PAC PT "6 Clicks" Mobility   Outcome Measure  Help  needed turning from your back to your side while in a flat bed without using bedrails?: A Lot Help needed moving from lying on your back to sitting on the side of a flat bed without using bedrails?: A Lot Help needed moving to and from a bed to a chair (including a wheelchair)?: A Lot Help needed standing up from a chair using your arms (e.g., wheelchair or bedside chair)?: A Lot Help needed to walk in hospital room?: Total Help needed climbing 3-5 steps with a railing? : Total 6 Click Score: 10    End of Session Equipment Utilized During Treatment: Oxygen Activity Tolerance: Patient limited by fatigue Patient left: in chair;with call bell/phone within reach;with family/visitor present Nurse Communication: Mobility status PT Visit Diagnosis: Unsteadiness on feet (R26.81);Other abnormalities of gait and mobility (R26.89);Muscle weakness (generalized) (M62.81)     Time: 1610-9604 PT Time Calculation (min) (ACUTE ONLY): 28 min  Charges:    $Therapeutic Activity: 23-37 mins PT General Charges $$ ACUTE PT VISIT: 1 Visit                     Arlyss Gandy, PT, DPT Acute Rehabilitation Office [phone]    Arlyss Gandy 05/12/2023, 5:57 PM

## 2023-05-12 NOTE — Progress Notes (Signed)
 PHARMACY - ANTICOAGULATION CONSULT NOTE  Pharmacy Consult for heparin gtt Indication: atrial fibrillation  Allergies  Allergen Reactions   Penicillins Itching and Rash   Hydrocodone Other (See Comments)    MAO-I ; cannot take medications.    Morphine And Codeine     Unable to take due to MAO-I   Morphine Sulfate Other (See Comments)   Sulfamethoxazole-Trimethoprim Nausea Only, Other (See Comments) and Nausea And Vomiting    And diarrhea.  Other Reaction(s): GI Intolerance    Patient Measurements: Height: 5' (152.4 cm) Weight: 61.5 kg (135 lb 9.3 oz) IBW/kg (Calculated) : 45.5 Heparin Dosing Weight: 61.5 kg  Vital Signs: Temp: 98.1 F (36.7 C) (02/28 1539) Temp Source: Oral (02/28 1539) BP: 157/78 (02/28 0643) Pulse Rate: 100 (02/28 0919)  Labs: Recent Labs    05/09/23 1704 05/09/23 1704 05/10/23 0528 05/11/23 0338 05/11/23 1708 05/12/23 0438 05/12/23 1151  HGB 11.6*  --  13.4  --   --   --  13.6  HCT 34.0*  --  40.4  --   --   --  41.0  PLT  --   --  98*  --   --   --  107*  CREATININE  --    < > 1.12* 0.76 0.67 0.78  --    < > = values in this interval not displayed.    Estimated Creatinine Clearance: 39.8 mL/min (by C-G formula based on SCr of 0.78 mg/dL).   Medical History: Past Medical History:  Diagnosis Date   Anxiety    Arthritis    Bipolar disorder (HCC)    Depression    Hyperlipidemia    Hypothyroidism    Hypothyroidism    Irritable bowel syndrome    RA (rheumatoid arthritis) (HCC)    Assessment: 89yo F with afib. Pharmacy consulted to dose heparin for anticoagulation. Surgery team following for partial SBO - no indications for emergent surgery for now.  Hgb 13.6, Plt 107   Goal of Therapy:  Heparin level 0.3-0.7 units/ml Monitor platelets by anticoagulation protocol: Yes   Plan:  D/c DVT prophylaxis enoxaparin Heparin bolus 3000 units IV x1 followed by Heparin 850 units/hr 8hr HL Daily HL, CBC F/u long term anticoagulation  plans and surgical needs.   Calton Dach, PharmD, BCCCP Clinical Pharmacist 05/12/2023 4:24 PM

## 2023-05-12 NOTE — Progress Notes (Signed)
 Subjective/Chief Complaint: NGT got pulled out overnight. She had 200 ml out before that. This am she states her stomach feels tight but she is not nauseas and denies abdominal pain. She is unsure if she has passed flatus today but passed some yesterday. Denies BM More SHOB this am and on O2. Some peripheral edema  Objective: Vital signs in last 24 hours: Temp:  [97.1 F (36.2 C)-99.1 F (37.3 C)] 98.5 F (36.9 C) (02/28 0741) Pulse Rate:  [83-110] 94 (02/28 0643) Resp:  [12-31] 22 (02/28 0643) BP: (115-161)/(58-100) 157/78 (02/28 0643) SpO2:  [91 %-97 %] 95 % (02/28 0643) Last BM Date :  (PTA)  Intake/Output from previous day: 02/27 0701 - 02/28 0700 In: 2091.8 [I.V.:1211.7; IV Piggyback:880.1] Out: 1100 [Urine:900; Emesis/NG output:200] Intake/Output this shift: No intake/output data recorded.  WDWN in NAD Abd - distended; healed incisions with no hernias Non-tender  Lab Results:  Recent Labs    05/09/23 1704 05/10/23 0528  WBC  --  14.1*  HGB 11.6* 13.4  HCT 34.0* 40.4  PLT  --  98*   BMET Recent Labs    05/11/23 1708 05/12/23 0438  NA 139 139  K 4.0 3.4*  CL 107 105  CO2 20* 19*  GLUCOSE 97 99  BUN 9 10  CREATININE 0.67 0.78  CALCIUM 7.9* 7.6*   PT/INR No results for input(s): "LABPROT", "INR" in the last 72 hours.  ABG Recent Labs    05/09/23 1704  PHART 7.309*  HCO3 16.7*    Studies/Results: DG Abd Portable 1V Result Date: 05/11/2023 CLINICAL DATA:  Small-bowel obstruction EXAM: PORTABLE ABDOMEN - 1 VIEW COMPARISON:  X-ray 05/10/2023 and older FINDINGS: Enteric tube overlying the presumed distal stomach in the right upper abdomen. Minimal small bowel gas. Scattered surgical changes. Contrast now seen in nondilated loops of colon diffusely. Curvature and advanced degenerative changes along the spine. Osteopenia. Overlapping cardiac leads. No obvious free air on this portable supine radiograph IMPRESSION: Contrast now seen along nondilated  colon diffusely. Please correlate with the time course of contrast administration. Electronically Signed   By: Karen Kays M.D.   On: 05/11/2023 13:53   DG Abd Portable 1V-Small Bowel Obstruction Protocol-initial, 8 hr delay Result Date: 05/10/2023 CLINICAL DATA:  Small-bowel obstruction follow-up EXAM: PORTABLE ABDOMEN - 1 VIEW COMPARISON:  Film from earlier in the same day. FINDINGS: Gastric catheter remains in the stomach. Administered contrast lies within the stomach and dilated small bowel centrally. No colonic contrast is noted. 24 hour follow-up film is recommended. No other focal abnormality is seen. IMPRESSION: No colonic contrast seen.  24 hour follow-up film is recommended. Electronically Signed   By: Alcide Clever M.D.   On: 05/10/2023 21:25   DG Abd Portable 1V-Small Bowel Protocol-Position Verification Result Date: 05/10/2023 CLINICAL DATA:  [id number] Encounter for imaging study to confirm nasogastric (NG) tube placement [id number] EXAM: PORTABLE ABDOMEN - 1 VIEW COMPARISON:  X-ray abdomen 05/09/2023 FINDINGS: Enteric tube courses below the hemidiaphragm with tip and side port overlying the expected region of the gastric lumen. Persistent gaseous dilatation of a short loop of small bowel within the left mid to lower abdomen. No radio-opaque calculi or other significant radiographic abnormality are seen. IMPRESSION: 1. Enteric tube in good position. 2. Persistent gaseous dilatation of a short loop of small bowel within the left mid to lower abdomen. Electronically Signed   By: Tish Frederickson M.D.   On: 05/10/2023 10:26    Anti-infectives: Anti-infectives (From admission, onward)  Start     Dose/Rate Route Frequency Ordered Stop   05/09/23 1000  cefTRIAXone (ROCEPHIN) 2 g in sodium chloride 0.9 % 100 mL IVPB        2 g 200 mL/hr over 30 Minutes Intravenous Every 24 hours 05/09/23 0906 05/14/23 0959   05/09/23 1000  metroNIDAZOLE (FLAGYL) IVPB 500 mg        500 mg 100 mL/hr over 60 Minutes  Intravenous Every 12 hours 05/09/23 0906 05/14/23 0959   05/09/23 0430  levofloxacin (LEVAQUIN) IVPB 750 mg        750 mg 100 mL/hr over 90 Minutes Intravenous  Once 05/09/23 0424 05/09/23 0602       Assessment/Plan: SBO - partial - CT w/ Distended fluid-filled stomach and small bowel into the pelvis. Distal small bowel decompressed. Findings compatible with distal small bowel obstruction. Large hiatal hernia, fluid-filled - No current indication for emergency surgery - aspiration with NGT placement - intubated and now s/p extubation - afebrile. CBC pending - contrast in colon on xray - NGT out overnight but had been decreasing in output. Advance to CLD but observe for aspiration risk - discussed with bedside RN - Keep K > 4 and Mg > 2 for bowel function, k 3.4 - getting iv repletion - Mobilize for bowel function   New afib RVR - if anticoagulation needed okay with heparin gtt from surgical standpoint   FEN: CLD. K repletion per primary, IVF per primary ID: rocephin/flagyl (aspiration) VTE: lovenox   Per primary: Acute onset afib RVR - on amio drip HLD IBS RA BPD H/o abdominal hysterectomy, h/o colostomy s/p reversal    LOS: 3 days    Eric Form 05/12/2023

## 2023-05-12 NOTE — Progress Notes (Signed)
 PT Cancellation Note  Patient Details Name: Erica [name] MRN: [id number] DOB: June 10, 1932   Cancelled Treatment:    Reason Eval/Treat Not Completed: Patient at procedure or test/unavailable.    Angelina Ok [facility] 05/12/2023, 9:54 AM Skip Mayer PT Acute [employer] [phone]

## 2023-05-12 NOTE — Progress Notes (Signed)
 Echocardiogram 2D Echocardiogram has been performed.  Warren Lacy Gayanne Prescott RDCS 05/12/2023, 10:04 AM

## 2023-05-12 NOTE — Progress Notes (Signed)
 [facility] ADULT ICU REPLACEMENT PROTOCOL   The patient does apply for the [facility] Adult ICU Electrolyte Replacment Protocol based on the criteria listed below:   1.Exclusion criteria: TCTS, ECMO, Dialysis, and Myasthenia Gravis patients 2. Is GFR >/= 30 ml/min? Yes.    Patient's GFR today is >60 3. Is SCr </= 2? Yes.   Patient's SCr is 0.78 mg/dL 4. Did SCr increase >/= 0.5 in 24 hours? No. 5.Pt's weight >40kg  Yes.   6. Abnormal electrolyte(s):   K 3.4  7. Electrolytes replaced per protocol 8.  Call MD STAT for K+ </= 2.5, Phos </= 1, or Mag </= 1 Physician:  Z. Elmaghraby  Misty Stanley R Llana Deshazo 05/12/2023 5:14 AM

## 2023-05-12 NOTE — Progress Notes (Signed)
 PROGRESS NOTE    Erica [name]  NFA:[id number] DOB: 07/18/1932 DOA: 05/08/2023 PCP: Aliene Beams, MD   Chief Complaint  Patient presents with   Nausea   Diarrhea    Brief Narrative:  89 year old woman who presented with 48h of nausea, vomiting and diarrhea.  Found to have SBO but difficulty passing NGT led to aspiration event. Decision made to intubate to facilitate placement as detailed in Dr Acey Lav note.  Patient intubated admitted to PCCM team placed on IV antibiotics for possible aspiration pneumonia.  Patient subsequently extubated and transferred to hospitalist team.  General surgery consulted and following.   Assessment & Plan:   Principal Problem:   Acute respiratory failure with hypoxia (HCC) Active Problems:   GERD (gastroesophageal reflux disease)   Depression   Small bowel obstruction (HCC)   Hypothyroidism   Major depressive disorder, recurrent episode with anxious distress (HCC)   Rheumatoid aortitis   Bipolar disorder with severe depression (HCC)   Hypokalemia   Obstruction of bowel (HCC)   Atrial fib/flutter, transient (HCC)   SBO (small bowel obstruction) (HCC)   Pressure injury of skin   Aspiration pneumonia (HCC)   Acute diastolic CHF (congestive heart failure) (HCC)  #1 acute hypoxic and hypercarbic respiratory failure -Felt likely secondary to aspiration pneumonia in the setting of NG tube placement. -Patient noted to be intubated and subsequently extubated. -Patient with complaints of worsening shortness of breath today on 5 L high flow nasal cannula. -Continue empiric IV antibiotics of Rocephin and Flagyl to complete a 5 to 7-day course. -Due to patient's complaints of worsening shortness of breath chest x-ray was ordered this morning as well as BNP obtained which was 344.5. -Chest x-ray with progressive interstitial and airspace disease bilaterally consistent with progressive bilateral pneumonia and edema.  Progressive bilateral  effusions. -Saline lock IV fluids. -Placed on Lasix 20 mg IV every 12 hours x 4 doses. -Follow.  2.  Aspiration pneumonia -Patient admitted had to be intubated with NG tube placement with concerns for aspiration pneumonia. -Continue empiric IV Rocephin, IV Flagyl to complete a 5 to 7-day course of treatment.  3.  Acute diastolic CHF exacerbation -Patient with complaints of worsening shortness of breath today on 5 L high flow nasal cannula. -Chest x-ray done concerning for volume overload, progressive bilateral pneumonia and edema, progressive bilateral effusions. -Patient noted to have gone into transient A-fib with RVR and currently on amiodarone drip. -BNP done this morning elevated at 344.5. -Patient noted to be +7.916 L during this hospitalization. -2D echo done this morning with a EF of 70 to 75%,NWMA, grade 1 DD. -Saline lock IV fluids. -Place on Lasix 20 mg IV every 12 hours x 4 doses. -Cardiology consulted.  4.  A-fib -Patient noted to go into A-fib likely transient secondary to small bowel obstruction, aspiration pneumonia. -Patient placed on amiodarone drip and currently on scheduled IV metoprolol. -2D echo obtained this morning with a EF of 70 to 75%, NWMA, grade 1 DD. -Patient noted to be in sinus tachycardia. -Continue IV amiodarone drip, IV metoprolol. -Currently not on anticoagulation as felt likely transient however if patient goes back into A-fib will need to be placed on IV heparin. -Consulted cardiology for further evaluation and management.  5.  SBO -Patient noted to have NG tube placed however NG tube noted to have been dislodged overnight. -Patient with no bowel movement. -Patient states some improvement with abdominal pain. -CT abdomen and pelvis done on admission with distended fluid-filled stomach and  small bowel into the pelvis.  Distal small bowel decompressed.  Findings compatible with distal small bowel obstruction.  Large hiatal hernia,  fluid-filled. -Abdominal films ordered this morning with previously administered enteric contrast remains within nondilated colon.  No significant retained contrast within the small bowel. -NG output noted of 200 cc prior to NG tube removal. -Patient being followed by general surgery and being managed by general surgery. -Patient to be started on clear liquids today. -Keep potassium approximately 4, magnesium approximately 2.  6.  Hypokalemia -Potassium runs ordered this morning per PCCM.  7.  Thrombocytopenia -Improving.  8.  History of rheumatoid arthritis -Outpatient follow-up.  9.  Hypothyroidism -Once tolerating oral intake can resume home regimen Synthroid.    10. Bipolar disorder/depression -Once tolerating oral intake we will resume home regimen.  11.  GERD -IV PPI  12.  Pressure injury, POA Pressure Injury 05/09/23 Coccyx Stage 1 -  Intact skin with non-blanchable redness of a localized area usually over a bony prominence. (Active)  05/09/23 0930  Location: Coccyx  Location Orientation:   Staging: Stage 1 -  Intact skin with non-blanchable redness of a localized area usually over a bony prominence.  Wound Description (Comments):   Present on Admission: Yes         DVT prophylaxis: Lovenox Code Status: DNR with interventions Family Communication: Updated patient and son at bedside. Disposition: TBD  Status is: Inpatient Remains inpatient appropriate because: Severity of illness   Consultants:  Admitted to PCCM General Surgery: Dr.Tsuei 05/09/2023  Procedures:  CT abdomen and pelvis 05/08/2023 Chest x-ray 05/11/2022 Abdominal films 05/09/2023, 05/11/2023, 05/12/2023. 2D echo 05/12/2023  Significant Hospital Events: Including procedures, antibiotic start and stop dates in addition to other pertinent events   2/25 admitted to ICU, was intubated for aspiration during NGT placement. Extubated in evening  2/26 improved pressures, going to try a small dose of  metop for rate     Antimicrobials:  Anti-infectives (From admission, onward)    Start     Dose/Rate Route Frequency Ordered Stop   05/09/23 1000  cefTRIAXone (ROCEPHIN) 2 g in sodium chloride 0.9 % 100 mL IVPB        2 g 200 mL/hr over 30 Minutes Intravenous Every 24 hours 05/09/23 0906 05/14/23 0959   05/09/23 1000  metroNIDAZOLE (FLAGYL) IVPB 500 mg        500 mg 100 mL/hr over 60 Minutes Intravenous Every 12 hours 05/09/23 0906 05/14/23 0959   05/09/23 0430  levofloxacin (LEVAQUIN) IVPB 750 mg        750 mg 100 mL/hr over 90 Minutes Intravenous  Once 05/09/23 0424 05/09/23 0602         Subjective: Patient sleeping but easily arousable.  States she feels rough.  Complaining of shortness of breath.  Denies any chest pain.  Still with some abdominal pain but feels he is improving.  Passing flatus.  No bowel movement yet.  NG tube out, son at bedside wondering what happened to the NG tube.  Objective: Vitals:   05/12/23 0643 05/12/23 0741 05/12/23 0919 05/12/23 1130  BP: (!) 157/78     Pulse: 94  100   Resp: (!) 22  (!) 27   Temp:  98.5 F (36.9 C)  98 F (36.7 C)  TempSrc:  Oral  Axillary  SpO2: 95%  92%   Weight:      Height:        Intake/Output Summary (Last 24 hours) at 05/12/2023 1510 Last data filed  at 05/12/2023 1400 Gross per 24 hour  Intake 1607.72 ml  Output --  Net 1607.72 ml   Filed Weights   05/09/23 0851 05/10/23 0500 05/11/23 0500  Weight: 56.8 kg 58.1 kg 61.5 kg    Examination:  General exam: NAD. Respiratory system: Some coarse breath sounds anterior lung fields.  No wheezing.  Fair air movement.  On 5 L high flow nasal cannula.   Cardiovascular system: Tachycardic. No JVD, murmurs, rubs, gallops or clicks. No pedal edema. Gastrointestinal system: Abdomen is mildly distended, soft, decreased tenderness to palpation diffusely, positive bowel sounds.  No rebound.  No guarding.  Central nervous system: Alert and oriented.  Moving extremities  spontaneously.  No focal neurological deficits. Extremities: Symmetric 5 x 5 power. Skin: No rashes, lesions or ulcers Psychiatry: Judgement and insight appear fair. Mood & affect appropriate.     Data Reviewed: I have personally reviewed following labs and imaging studies  CBC: Recent Labs  Lab 05/08/23 2120 05/09/23 0440 05/09/23 0747 05/09/23 0805 05/09/23 1704 05/10/23 0528 05/12/23 1151  WBC 9.0 6.7 4.8  --   --  14.1* 15.0*  NEUTROABS  --  4.5 3.0  --   --   --   --   HGB 14.1 13.0 14.7 14.6 11.6* 13.4 13.6  HCT 41.7 39.2 45.7 43.0 34.0* 40.4 41.0  MCV 96.1 97.5 99.8  --   --  99.5 96.0  PLT 166 155 128*  --   --  98* 107*    Basic Metabolic Panel: Recent Labs  Lab 05/09/23 0747 05/09/23 0805 05/09/23 1704 05/10/23 0528 05/11/23 0338 05/11/23 1708 05/12/23 0438  NA 136   < > 134* 136 140 139 139  K 3.2*   < > 4.5 4.2 3.0* 4.0 3.4*  CL 108  --   --  107 106 107 105  CO2 18*  --   --  17* 22 20* 19*  GLUCOSE 134*  --   --  94 109* 97 99  BUN 25*  --   --  18 9 9 10   CREATININE 1.06*  --   --  1.12* 0.76 0.67 0.78  CALCIUM 8.0*  --   --  7.9* 8.0* 7.9* 7.6*  MG 1.7  --   --  2.2  --   --  2.0  PHOS 3.9  --   --  2.7  --   --   --    < > = values in this interval not displayed.    GFR: Estimated Creatinine Clearance: 39.8 mL/min (by C-G formula based on SCr of 0.78 mg/dL).  Liver Function Tests: Recent Labs  Lab 05/08/23 2120 05/09/23 0747  AST 17 26  ALT 13 14  ALKPHOS 67 50  BILITOT 0.4 0.7  PROT 8.1 7.0  ALBUMIN 4.6 3.3*    CBG: Recent Labs  Lab 05/11/23 1947 05/11/23 2344 05/12/23 0328 05/12/23 0741 05/12/23 1133  GLUCAP 80 80 82 93 108*     Recent Results (from the past 240 hours)  MRSA Next Gen by PCR, Nasal     Status: None   Collection Time: 05/09/23  8:52 AM   Specimen: Nasal Mucosa; Nasal Swab  Result Value Ref Range Status   MRSA by PCR Next Gen NOT DETECTED NOT DETECTED Final    Comment: (NOTE) The GeneXpert MRSA Assay  (FDA approved for NASAL specimens only), is one component of a comprehensive MRSA colonization surveillance program. It is not intended to diagnose MRSA infection nor  to guide or monitor treatment for MRSA infections. Test performance is not FDA approved in patients less than 74 years old. Performed at [facility], [address] [place], [place] [address]          Radiology Studies: DG CHEST PORT 1 VIEW Result Date: 05/12/2023 CLINICAL DATA:  Shortness of breath. EXAM: PORTABLE CHEST 1 VIEW COMPARISON:  One-view chest x-ray 05/09/2023 FINDINGS: The patient has been extubated.  Gastric tube was removed. The heart size is normal. Atherosclerotic changes are present at the aortic arch progressive interstitial and airspace disease is present bilaterally. Progressive bilateral effusions are present. IMPRESSION: 1. Progressive interstitial and airspace disease bilaterally consistent with progressive bilateral pneumonia and edema. 2. Progressive bilateral effusions. Electronically Signed   By: Marin Roberts M.D.   On: 05/12/2023 14:17   DG Abd Portable 1V Result Date: 05/12/2023 CLINICAL DATA:  Follow up small bowel obstruction. EXAM: PORTABLE ABDOMEN - 1 VIEW COMPARISON:  Radiographs 05/11/2023 and 05/10/2023.  CT 05/08/2023. FINDINGS: 0830 hours. 2 portable supine views of the abdomen are submitted. Previously administered enteric contrast remains within nondilated colon. No significant retained contrast within the small bowel. There are few mildly dilated loops of small bowel centrally. No evidence of contrast extravasation. Multiple surgical clips are noted. Enteric tube has been removed. IMPRESSION: Previously administered enteric contrast remains within nondilated colon. No significant retained contrast within the small bowel. Electronically Signed   By: Carey Bullocks M.D.   On: 05/12/2023 12:30   ECHOCARDIOGRAM COMPLETE Result Date: 05/12/2023    ECHOCARDIOGRAM REPORT    Patient Name:   Erica [name] Date of Exam: 05/12/2023 Medical Rec #:  [id number]       Height:       60.0 in Accession #:    [id number]      Weight:       135.6 lb Date of Birth:  1932/03/29       BSA:          1.582 m Patient Age:    89 years        BP:           158/93 mmHg Patient Gender: F               HR:           106 bpm. Exam Location:  Inpatient Procedure: 2D Echo, Cardiac Doppler and Color Doppler (Both Spectral and Color            Flow Doppler were utilized during procedure). Indications:    I48.91* Unspecified atrial fibrillation  History:        Patient has prior history of Echocardiogram examinations, most                 recent 05/01/2017. Arrythmias:Atrial Fibrillation; Risk                 Factors:Hypertension and Dyslipidemia.  Sonographer:    Irving Burton Senior RDCS Referring Phys: [id number] Antron Seth V Jamesha Ellsworth IMPRESSIONS  1. Left ventricular ejection fraction, by estimation, is 70 to 75%. Left ventricular ejection fraction by PLAX is 75 %. The left ventricle has hyperdynamic function. The left ventricle has no regional wall motion abnormalities. Left ventricular diastolic parameters are consistent with Grade I diastolic dysfunction (impaired relaxation).  2. Right ventricular systolic function is normal. The right ventricular size is normal. There is normal pulmonary artery systolic pressure. The estimated right ventricular systolic pressure is 29.8 mmHg.  3. The mitral valve is  normal in structure. Mild mitral valve regurgitation. Moderate mitral annular calcification.  4. The aortic valve is tricuspid. Aortic valve regurgitation is not visualized. Aortic valve sclerosis is present, with no evidence of aortic valve stenosis.  5. The inferior vena cava is normal in size with greater than 50% respiratory variability, suggesting right atrial pressure of 3 mmHg. FINDINGS  Left Ventricle: Left ventricular ejection fraction, by estimation, is 70 to 75%. Left ventricular ejection fraction by PLAX is 75 %. The  left ventricle has hyperdynamic function. The left ventricle has no regional wall motion abnormalities. The left ventricular internal cavity size was normal in size. There is no left ventricular hypertrophy. Left ventricular diastolic parameters are consistent with Grade I diastolic dysfunction (impaired relaxation). Right Ventricle: The right ventricular size is normal. No increase in right ventricular wall thickness. Right ventricular systolic function is normal. There is normal pulmonary artery systolic pressure. The tricuspid regurgitant velocity is 2.59 m/s, and  with an assumed right atrial pressure of 3 mmHg, the estimated right ventricular systolic pressure is 29.8 mmHg. Left Atrium: Left atrial size was normal in size. Right Atrium: Right atrial size was normal in size. Pericardium: Trivial pericardial effusion is present. Mitral Valve: The mitral valve is normal in structure. Moderate mitral annular calcification. Mild mitral valve regurgitation. Tricuspid Valve: The tricuspid valve is normal in structure. Tricuspid valve regurgitation is trivial. Aortic Valve: The aortic valve is tricuspid. Aortic valve regurgitation is not visualized. Aortic valve sclerosis is present, with no evidence of aortic valve stenosis. Pulmonic Valve: The pulmonic valve was normal in structure. Pulmonic valve regurgitation is trivial. Aorta: The aortic root and ascending aorta are structurally normal, with no evidence of dilitation. Venous: The inferior vena cava is normal in size with greater than 50% respiratory variability, suggesting right atrial pressure of 3 mmHg. IAS/Shunts: No atrial level shunt detected by color flow Doppler.  LEFT VENTRICLE PLAX 2D LV EF:         Left            Diastology                ventricular     LV e' medial:    9.14 cm/s                ejection        LV E/e' medial:  8.4                fraction by     LV e' lateral:   9.79 cm/s                PLAX is 75      LV E/e' lateral: 7.8                 %. LVIDd:         3.30 cm LVIDs:         1.90 cm LV PW:         0.80 cm LV IVS:        1.00 cm LVOT diam:     1.90 cm LV SV:         45 LV SV Index:   29 LVOT Area:     2.84 cm  RIGHT VENTRICLE RV S prime:     15.10 cm/s TAPSE (M-mode): 1.9 cm LEFT ATRIUM             Index        RIGHT ATRIUM  Index LA diam:        2.80 cm 1.77 cm/m   RA Area:     8.27 cm LA Vol (A2C):   22.2 ml 14.03 ml/m  RA Volume:   13.60 ml 8.59 ml/m LA Vol (A4C):   36.3 ml 22.94 ml/m LA Biplane Vol: 30.3 ml 19.15 ml/m  AORTIC VALVE LVOT Vmax:   94.20 cm/s LVOT Vmean:  61.700 cm/s LVOT VTI:    0.160 m  AORTA Ao Root diam: 2.70 cm Ao Asc diam:  2.60 cm MITRAL VALVE                TRICUSPID VALVE MV Area (PHT): 2.49 cm     TR Peak grad:   26.8 mmHg MV Decel Time: 305 msec     TR Vmax:        259.00 cm/s MV E velocity: 76.50 cm/s MV A velocity: 106.00 cm/s  SHUNTS MV E/A ratio:  0.72         Systemic VTI:  0.16 m                             Systemic Diam: 1.90 cm Clearnce Hasten Electronically signed by Clearnce Hasten Signature Date/Time: 05/12/2023/10:04:37 AM    Final    DG Abd Portable 1V Result Date: 05/11/2023 CLINICAL DATA:  Small-bowel obstruction EXAM: PORTABLE ABDOMEN - 1 VIEW COMPARISON:  X-ray 05/10/2023 and older FINDINGS: Enteric tube overlying the presumed distal stomach in the right upper abdomen. Minimal small bowel gas. Scattered surgical changes. Contrast now seen in nondilated loops of colon diffusely. Curvature and advanced degenerative changes along the spine. Osteopenia. Overlapping cardiac leads. No obvious free air on this portable supine radiograph IMPRESSION: Contrast now seen along nondilated colon diffusely. Please correlate with the time course of contrast administration. Electronically Signed   By: Karen Kays M.D.   On: 05/11/2023 13:53   DG Abd Portable 1V-Small Bowel Obstruction Protocol-initial, 8 hr delay Result Date: 05/10/2023 CLINICAL DATA:  Small-bowel obstruction follow-up EXAM:  PORTABLE ABDOMEN - 1 VIEW COMPARISON:  Film from earlier in the same day. FINDINGS: Gastric catheter remains in the stomach. Administered contrast lies within the stomach and dilated small bowel centrally. No colonic contrast is noted. 24 hour follow-up film is recommended. No other focal abnormality is seen. IMPRESSION: No colonic contrast seen.  24 hour follow-up film is recommended. Electronically Signed   By: Alcide Clever M.D.   On: 05/10/2023 21:25        Scheduled Meds:  Chlorhexidine Gluconate Cloth  6 each Topical Daily   enoxaparin (LOVENOX) injection  40 mg Subcutaneous Q24H   feeding supplement  1 Container Oral TID BM   furosemide  20 mg Intravenous Q12H   insulin aspart  0-9 Units Subcutaneous Q4H   metoprolol tartrate  2.5 mg Intravenous Q6H   mouth rinse  15 mL Mouth Rinse 4 times per day   pantoprazole (PROTONIX) IV  40 mg Intravenous QHS   Continuous Infusions:  amiodarone 30 mg/hr (05/12/23 1435)   cefTRIAXone (ROCEPHIN)  IV Stopped (05/12/23 1119)   metronidazole Stopped (05/12/23 1238)     LOS: 3 days    Time spent: 45 minutes    Ramiro Harvest, MD [facility]   To contact the attending provider between 7A-7P or the covering provider during after hours 7P-7A, please log into the web site [email or web address] and access using universal [place] password for that web site. If  you do not have the password, please call the hospital operator.  05/12/2023, 3:10 PM

## 2023-05-13 ENCOUNTER — Inpatient Hospital Stay (HOSPITAL_COMMUNITY)

## 2023-05-13 DIAGNOSIS — I011 Acute rheumatic endocarditis: Secondary | ICD-10-CM | POA: Diagnosis not present

## 2023-05-13 DIAGNOSIS — J9601 Acute respiratory failure with hypoxia: Secondary | ICD-10-CM | POA: Diagnosis not present

## 2023-05-13 DIAGNOSIS — K56609 Unspecified intestinal obstruction, unspecified as to partial versus complete obstruction: Secondary | ICD-10-CM | POA: Diagnosis not present

## 2023-05-13 DIAGNOSIS — M7989 Other specified soft tissue disorders: Secondary | ICD-10-CM | POA: Diagnosis not present

## 2023-05-13 DIAGNOSIS — E039 Hypothyroidism, unspecified: Secondary | ICD-10-CM | POA: Diagnosis not present

## 2023-05-13 LAB — GLUCOSE, CAPILLARY
Glucose-Capillary: 109 mg/dL — ABNORMAL HIGH (ref 70–99)
Glucose-Capillary: 116 mg/dL — ABNORMAL HIGH (ref 70–99)
Glucose-Capillary: 117 mg/dL — ABNORMAL HIGH (ref 70–99)
Glucose-Capillary: 119 mg/dL — ABNORMAL HIGH (ref 70–99)
Glucose-Capillary: 122 mg/dL — ABNORMAL HIGH (ref 70–99)
Glucose-Capillary: 128 mg/dL — ABNORMAL HIGH (ref 70–99)

## 2023-05-13 LAB — CBC WITH DIFFERENTIAL/PLATELET
Abs Immature Granulocytes: 0.31 10*3/uL — ABNORMAL HIGH (ref 0.00–0.07)
Basophils Absolute: 0 10*3/uL (ref 0.0–0.1)
Basophils Relative: 0 %
Eosinophils Absolute: 0 10*3/uL (ref 0.0–0.5)
Eosinophils Relative: 0 %
HCT: 33.4 % — ABNORMAL LOW (ref 36.0–46.0)
Hemoglobin: 11.5 g/dL — ABNORMAL LOW (ref 12.0–15.0)
Immature Granulocytes: 3 %
Lymphocytes Relative: 14 %
Lymphs Abs: 1.7 10*3/uL (ref 0.7–4.0)
MCH: 32.5 pg (ref 26.0–34.0)
MCHC: 34.4 g/dL (ref 30.0–36.0)
MCV: 94.4 fL (ref 80.0–100.0)
Monocytes Absolute: 1.1 10*3/uL — ABNORMAL HIGH (ref 0.1–1.0)
Monocytes Relative: 10 %
Neutro Abs: 8.5 10*3/uL — ABNORMAL HIGH (ref 1.7–7.7)
Neutrophils Relative %: 73 %
Platelets: 120 10*3/uL — ABNORMAL LOW (ref 150–400)
RBC: 3.54 MIL/uL — ABNORMAL LOW (ref 3.87–5.11)
RDW: 13.2 % (ref 11.5–15.5)
WBC: 11.6 10*3/uL — ABNORMAL HIGH (ref 4.0–10.5)
nRBC: 0 % (ref 0.0–0.2)

## 2023-05-13 LAB — BASIC METABOLIC PANEL
Anion gap: 17 — ABNORMAL HIGH (ref 5–15)
BUN: 10 mg/dL (ref 8–23)
CO2: 21 mmol/L — ABNORMAL LOW (ref 22–32)
Calcium: 7.3 mg/dL — ABNORMAL LOW (ref 8.9–10.3)
Chloride: 96 mmol/L — ABNORMAL LOW (ref 98–111)
Creatinine, Ser: 0.75 mg/dL (ref 0.44–1.00)
GFR, Estimated: >60 mL/min (ref >60)
Glucose, Bld: 240 mg/dL — ABNORMAL HIGH (ref 70–99)
Potassium: 3 mmol/L — ABNORMAL LOW (ref 3.5–5.1)
Sodium: 134 mmol/L — ABNORMAL LOW (ref 135–145)

## 2023-05-13 LAB — HEPARIN LEVEL (UNFRACTIONATED)
Heparin Unfractionated: 0.38 [IU]/mL (ref 0.30–0.70)
Heparin Unfractionated: 0.32 [IU]/mL (ref 0.30–0.70)

## 2023-05-13 LAB — RESP PANEL BY RT-PCR (RSV, FLU A&B, COVID)  RVPGX2
Influenza A by PCR: NEGATIVE
Influenza B by PCR: NEGATIVE
Resp Syncytial Virus by PCR: NEGATIVE
SARS Coronavirus 2 by RT PCR: NEGATIVE

## 2023-05-13 LAB — MAGNESIUM: Magnesium: 1.5 mg/dL — ABNORMAL LOW (ref 1.7–2.4)

## 2023-05-13 LAB — TSH: TSH: 4.6 u[IU]/mL — ABNORMAL HIGH (ref 0.350–4.500)

## 2023-05-13 LAB — ALBUMIN: Albumin: 2.2 g/dL — ABNORMAL LOW (ref 3.5–5.0)

## 2023-05-13 LAB — BRAIN NATRIURETIC PEPTIDE: B Natriuretic Peptide: 303.2 pg/mL — ABNORMAL HIGH (ref 0.0–100.0)

## 2023-05-13 MED ORDER — FUROSEMIDE 10 MG/ML IJ SOLN
40.0000 mg | Freq: Two times a day (BID) | INTRAMUSCULAR | Status: AC
  Administered 2023-05-13 – 2023-05-14 (×2): 40 mg via INTRAVENOUS
  Filled 2023-05-13 (×2): qty 4

## 2023-05-13 MED ORDER — METRONIDAZOLE 500 MG/100ML IV SOLN
500.0000 mg | Freq: Two times a day (BID) | INTRAVENOUS | Status: AC
  Administered 2023-05-13 – 2023-05-15 (×4): 500 mg via INTRAVENOUS
  Filled 2023-05-13 (×4): qty 100

## 2023-05-13 MED ORDER — BREXPIPRAZOLE 1 MG PO TABS
1.0000 mg | ORAL_TABLET | Freq: Every day | ORAL | Status: DC
  Administered 2023-05-13 – 2023-05-17 (×5): 1 mg via ORAL
  Filled 2023-05-13 (×6): qty 1

## 2023-05-13 MED ORDER — BISACODYL 10 MG RE SUPP
10.0000 mg | Freq: Once | RECTAL | Status: AC
  Administered 2023-05-13: 10 mg via RECTAL
  Filled 2023-05-13: qty 1

## 2023-05-13 MED ORDER — BENZTROPINE MESYLATE 0.5 MG PO TABS
0.5000 mg | ORAL_TABLET | Freq: Every day | ORAL | Status: DC
  Administered 2023-05-13 – 2023-05-17 (×5): 0.5 mg via ORAL
  Filled 2023-05-13 (×6): qty 1

## 2023-05-13 MED ORDER — MAGNESIUM SULFATE 4 GM/100ML IV SOLN
4.0000 g | Freq: Once | INTRAVENOUS | Status: AC
  Administered 2023-05-13: 4 g via INTRAVENOUS
  Filled 2023-05-13: qty 100

## 2023-05-13 MED ORDER — SODIUM CHLORIDE 0.9 % IV SOLN
2.0000 g | INTRAVENOUS | Status: AC
  Administered 2023-05-14 – 2023-05-15 (×2): 2 g via INTRAVENOUS
  Filled 2023-05-13 (×2): qty 20

## 2023-05-13 MED ORDER — MELATONIN 3 MG PO TABS: 6.0000 mg | ORAL_TABLET | Freq: Every evening | ORAL | Status: DC | PRN

## 2023-05-13 MED ORDER — ARIPIPRAZOLE 5 MG PO TABS
5.0000 mg | ORAL_TABLET | Freq: Every day | ORAL | Status: DC
Start: 2023-05-13 — End: 2023-05-13

## 2023-05-13 MED ORDER — POTASSIUM CHLORIDE CRYS ER 20 MEQ PO TBCR
40.0000 meq | EXTENDED_RELEASE_TABLET | ORAL | Status: AC
  Administered 2023-05-13 (×2): 40 meq via ORAL
  Filled 2023-05-13 (×2): qty 2

## 2023-05-13 MED ORDER — LEVOTHYROXINE SODIUM 88 MCG PO TABS
88.0000 ug | ORAL_TABLET | Freq: Every day | ORAL | Status: DC
  Administered 2023-05-14 – 2023-05-18 (×5): 88 ug via ORAL
  Filled 2023-05-13 (×6): qty 1

## 2023-05-13 MED ORDER — VENLAFAXINE HCL ER 75 MG PO CP24
150.0000 mg | ORAL_CAPSULE | Freq: Every morning | ORAL | Status: DC
  Administered 2023-05-13 – 2023-05-18 (×6): 150 mg via ORAL
  Filled 2023-05-13 (×3): qty 2
  Filled 2023-05-13: qty 1
  Filled 2023-05-13 (×2): qty 2
  Filled 2023-05-13: qty 1

## 2023-05-13 NOTE — Plan of Care (Signed)
  Problem: Fluid Volume: Goal: Ability to maintain a balanced intake and output will improve Outcome: Progressing   Problem: Metabolic: Goal: Ability to maintain appropriate glucose levels will improve Outcome: Progressing   Problem: Nutritional: Goal: Maintenance of adequate nutrition will improve Outcome: Progressing   Problem: Clinical Measurements: Goal: Respiratory complications will improve Outcome: Progressing   Problem: Activity: Goal: Risk for activity intolerance will decrease Outcome: Progressing   Problem: Coping: Goal: Level of anxiety will decrease Outcome: Progressing

## 2023-05-13 NOTE — Progress Notes (Signed)
 PROGRESS NOTE    Erica [name]  WUJ:[id number] DOB: [birth year] DOA: 05/08/2023 PCP: Aliene Beams, MD   Chief Complaint  Patient presents with   Nausea   Diarrhea    Brief Narrative:  89 year old woman who presented with 48h of nausea, vomiting and diarrhea.  Found to have SBO but difficulty passing NGT led to aspiration event. Decision made to intubate to facilitate placement as detailed in Dr Acey Lav note.  Patient intubated admitted to PCCM team placed on IV antibiotics for possible aspiration pneumonia.  Patient subsequently extubated and transferred to hospitalist team.  General surgery consulted and following.   Assessment & Plan:   Principal Problem:   Acute respiratory failure with hypoxia (HCC) Active Problems:   GERD (gastroesophageal reflux disease)   Depression   Small bowel obstruction (HCC)   Hypothyroidism   Major depressive disorder, recurrent episode with anxious distress (HCC)   Rheumatoid aortitis   Bipolar disorder with severe depression (HCC)   Hypokalemia   Obstruction of bowel (HCC)   Atrial fib/flutter, transient (HCC)   SBO (small bowel obstruction) (HCC)   Pressure injury of skin   Aspiration pneumonia (HCC)   Acute diastolic CHF (congestive heart failure) (HCC)  #1 acute hypoxic and hypercarbic respiratory failure -Multifactorial, felt likely secondary to aspiration pneumonia in the setting of NG tube placement, acute diastolic CHF exacerbation, atrial fibrillation. -Patient noted to be intubated and subsequently extubated. -Patient with complaints of worsening shortness of breath on 5 L high flow nasal cannula. -Continue empiric IV antibiotics of Rocephin and Flagyl to complete a 7-day course. -Due to patient's complaints of worsening shortness of breath chest x-ray was ordered consistent with volume overload and pneumonia, as well as BNP obtained which was 344.5. -Chest x-ray with progressive interstitial and airspace disease bilaterally  consistent with progressive bilateral pneumonia and edema.  Progressive bilateral effusions. -Continue current dose of Lasix 20 mg IV every 12 hours, IV antibiotics, supportive care. -Follow.  2.  Aspiration pneumonia -Patient admitted had to be intubated with NG tube placement with concerns for aspiration pneumonia. -Continue IV Rocephin and IV Flagyl to complete a 7-day course of treatment.  3.  Acute diastolic CHF exacerbation -Patient with complaints of worsening shortness of breath on 05/12/2023 as well as today 05/13/2023 on 5 L high flow nasal cannula. -Likely secondary to A-fib. -Chest x-ray done concerning for volume overload, progressive bilateral pneumonia and edema, progressive bilateral effusions. -Patient noted to have gone into transient A-fib with RVR and currently on amiodarone drip. -BNP done elevated.  -Patient noted to be +7.916 L during this hospitalization. -2D echo done with a EF of 70 to 75%,NWMA, grade 1 DD. -Saline lock IV fluids. -Patient currently on Lasix 20 mg IV every 12 hours with urine output of 1.550 L over the past 24 hours. -Continue IV Lasix. -Cardiology following.  4.  A-fib CHA2DS2VASC score 4 -Patient noted to go into A-fib intermittently and going into A-fib back-and-forth per telemetry, likely secondary to small bowel obstruction, aspiration pneumonia. -Patient placed on amiodarone drip and currently on scheduled IV metoprolol. -2D echo obtained with a EF of 70 to 75%, NWMA, grade 1 DD. -Patient noted to be going in and out of A-fib on telemetry.  -Continue IV amiodarone drip, IV metoprolol. -Patient seen in consultation by cardiology and patient rebolus with IV amiodarone and IV metoprolol dose increased.   -Patient started on heparin.   -Per cardiology.  5.  SBO -Patient noted to have NG tube placed  however NG tube noted to have been dislodged overnight. -Patient with bowel movement overnight per daughter.  -Patient states some improvement  with abdominal pain. -CT abdomen and pelvis done on admission with distended fluid-filled stomach and small bowel into the pelvis.  Distal small bowel decompressed.  Findings compatible with distal small bowel obstruction.  Large hiatal hernia, fluid-filled. -Abdominal films ordered this morning with previously administered enteric contrast remains within nondilated colon.  No significant retained contrast within the small bowel. -NG tube noted to have been pulled out per patient on 05/11/2023. -Patient being followed by general surgery and being managed by general surgery.   -Currently on clear liquids and tolerating. -Keep potassium approximately 4, magnesium approximately 2. -Per general surgery.  6.  Hypokalemia/hypomagnesemia -Patient on IV Lasix.   -Replete potassium and magnesium.   -Keep potassium approximately 4, magnesium approximately 2.  7.  Thrombocytopenia -Improving.  8.  History of rheumatoid arthritis -Outpatient follow-up.  9.  Hypothyroidism -Resume home regimen Synthroid.   10. Bipolar disorder/depression -.  Resume home regimen.   11.  GERD -Continue IV PPI.   12.  Pressure injury, POA Pressure Injury 05/09/23 Coccyx Stage 1 -  Intact skin with non-blanchable redness of a localized area usually over a bony prominence. (Active)  05/09/23 0930  Location: Coccyx  Location Orientation:   Staging: Stage 1 -  Intact skin with non-blanchable redness of a localized area usually over a bony prominence.  Wound Description (Comments):   Present on Admission: Yes         DVT prophylaxis: Heparin drip Code Status: DNR with interventions Family Communication: Updated patient and daughter and son-in-law at bedside. Disposition: TBD  Status is: Inpatient Remains inpatient appropriate because: Severity of illness   Consultants:  Admitted to PCCM General Surgery: Dr.Tsuei 05/09/2023 Cardiology: Dr. Antoine Poche 05/12/2023  Procedures:  CT abdomen and pelvis  05/08/2023 Chest x-ray 05/11/2022 Abdominal films 05/09/2023, 05/11/2023, 05/12/2023. 2D echo 05/12/2023  Significant Hospital Events: Including procedures, antibiotic start and stop dates in addition to other pertinent events   2/25 admitted to ICU, was intubated for aspiration during NGT placement. Extubated in evening  2/26 improved pressures, going to try a small dose of metop for rate     Antimicrobials:  Anti-infectives (From admission, onward)    Start     Dose/Rate Route Frequency Ordered Stop   05/13/23 2200  metroNIDAZOLE (FLAGYL) IVPB 500 mg        500 mg 100 mL/hr over 60 Minutes Intravenous Every 12 hours 05/13/23 1005 05/15/23 2159   05/13/23 1100  cefTRIAXone (ROCEPHIN) 2 g in sodium chloride 0.9 % 100 mL IVPB        2 g 200 mL/hr over 30 Minutes Intravenous Every 24 hours 05/13/23 1005 05/15/23 1059   05/09/23 1000  cefTRIAXone (ROCEPHIN) 2 g in sodium chloride 0.9 % 100 mL IVPB        2 g 200 mL/hr over 30 Minutes Intravenous Every 24 hours 05/09/23 0906 05/13/23 0923   05/09/23 1000  metroNIDAZOLE (FLAGYL) IVPB 500 mg  Status:  Discontinued        500 mg 100 mL/hr over 60 Minutes Intravenous Every 12 hours 05/09/23 0906 05/13/23 1005   05/09/23 0430  levofloxacin (LEVAQUIN) IVPB 750 mg        750 mg 100 mL/hr over 90 Minutes Intravenous  Once 05/09/23 0424 05/09/23 0602         Subjective: Patient laying in bed, daughter and son-in-law at bedside.  Patient  states she feels horrible/terrible this morning.  States no significant improvement with shortness of breath.  Denies any chest pain.  Per daughter patient had a bowel movement overnight after receiving the suppository.  No nausea or vomiting.  On clear liquids.   Patient noted on telemetry to be going in and out of A-fib.  Events overnight noted.  Objective: Vitals:   05/13/23 0800 05/13/23 0834 05/13/23 0900 05/13/23 1000  BP: 104/71  105/81 (!) 91/58  Pulse: 97 (!) 101 99 80  Resp: (!) 25 (!) 24 (!) 28 (!)  29  Temp:  97.8 F (36.6 C)    TempSrc:  Oral    SpO2: 95% 93% 90% 95%  Weight:      Height:        Intake/Output Summary (Last 24 hours) at 05/13/2023 1013 Last data filed at 05/13/2023 0800 Gross per 24 hour  Intake 1482.9 ml  Output 1550 ml  Net -67.1 ml   Filed Weights   05/10/23 0500 05/11/23 0500 05/13/23 0500  Weight: 58.1 kg 61.5 kg 64.8 kg    Examination:  General exam: NAD. Respiratory system: Diffuse crackles noted.  No wheezing.  Fair air movement.  On 5 L high flow nasal cannula.  Cardiovascular system: Irregularly irregular.  Positive JVD.  No murmurs rubs or gallops.  Nonpitting lower extremity edema.  Gastrointestinal system: Abdomen is distended, soft, decreased tenderness to palpation diffusely, positive bowel sounds.  No rebound.  No guarding.  Central nervous system: Alert and oriented.  Moving extremities spontaneously.  No focal neurological deficits. Extremities: Right upper extremity swelling. Skin: No rashes, lesions or ulcers Psychiatry: Judgement and insight appear fair. Mood & affect appropriate.     Data Reviewed: I have personally reviewed following labs and imaging studies  CBC: Recent Labs  Lab 05/09/23 0440 05/09/23 0747 05/09/23 0805 05/09/23 1704 05/10/23 0528 05/12/23 1151 05/13/23 0250  WBC 6.7 4.8  --   --  14.1* 15.0* 11.6*  NEUTROABS 4.5 3.0  --   --   --   --  8.5*  HGB 13.0 14.7 14.6 11.6* 13.4 13.6 11.5*  HCT 39.2 45.7 43.0 34.0* 40.4 41.0 33.4*  MCV 97.5 99.8  --   --  99.5 96.0 94.4  PLT 155 128*  --   --  98* 107* 120*    Basic Metabolic Panel: Recent Labs  Lab 05/09/23 0747 05/09/23 0805 05/10/23 0528 05/11/23 0338 05/11/23 1708 05/12/23 0438 05/13/23 0250  NA 136   < > 136 140 139 139 134*  K 3.2*   < > 4.2 3.0* 4.0 3.4* 3.0*  CL 108  --  107 106 107 105 96*  CO2 18*  --  17* 22 20* 19* 21*  GLUCOSE 134*  --  94 109* 97 99 240*  BUN 25*  --  18 9 9 10 10   CREATININE 1.06*  --  1.12* 0.76 0.67 0.78 0.75   CALCIUM 8.0*  --  7.9* 8.0* 7.9* 7.6* 7.3*  MG 1.7  --  2.2  --   --  2.0 1.5*  PHOS 3.9  --  2.7  --   --   --   --    < > = values in this interval not displayed.    GFR: Estimated Creatinine Clearance: 40.8 mL/min (by C-G formula based on SCr of 0.75 mg/dL).  Liver Function Tests: Recent Labs  Lab 05/08/23 2120 05/09/23 0747 05/13/23 0250  AST 17 26  --   ALT 13  14  --   ALKPHOS 67 50  --   BILITOT 0.4 0.7  --   PROT 8.1 7.0  --   ALBUMIN 4.6 3.3* 2.2*    CBG: Recent Labs  Lab 05/12/23 1541 05/12/23 1932 05/12/23 2323 05/13/23 0332 05/13/23 0821  GLUCAP 87 105* 119* 119* 109*     Recent Results (from the past 240 hours)  MRSA Next Gen by PCR, Nasal     Status: None   Collection Time: 05/09/23  8:52 AM   Specimen: Nasal Mucosa; Nasal Swab  Result Value Ref Range Status   MRSA by PCR Next Gen NOT DETECTED NOT DETECTED Final    Comment: (NOTE) The GeneXpert MRSA Assay (FDA approved for NASAL specimens only), is one component of a comprehensive MRSA colonization surveillance program. It is not intended to diagnose MRSA infection nor to guide or monitor treatment for MRSA infections. Test performance is not FDA approved in patients less than 64 years old. Performed at [facility], [address] [place], [place] [address]   Resp panel by RT-PCR (RSV, Flu A&B, Covid) Anterior Nasal Swab     Status: None   Collection Time: 05/13/23 12:58 AM   Specimen: Anterior Nasal Swab  Result Value Ref Range Status   SARS Coronavirus 2 by RT PCR NEGATIVE NEGATIVE Final   Influenza A by PCR NEGATIVE NEGATIVE Final   Influenza B by PCR NEGATIVE NEGATIVE Final    Comment: (NOTE) The Xpert Xpress SARS-CoV-2/FLU/RSV plus assay is intended as an aid in the diagnosis of influenza from Nasopharyngeal swab specimens and should not be used as a sole basis for treatment. Nasal washings and aspirates are unacceptable for Xpert Xpress SARS-CoV-2/FLU/RSV testing.  Fact  Sheet for Patients: BloggerCourse.com  Fact Sheet for Healthcare Providers: SeriousBroker.it  This test is not yet approved or cleared by the [place] FDA and has been authorized for detection and/or diagnosis of SARS-CoV-2 by FDA under an Emergency Use Authorization (EUA). This EUA will remain in effect (meaning this test can be used) for the duration of the COVID-19 declaration under Section 564(b)(1) of the Act, 21 U.S.C. section 360bbb-3(b)(1), unless the authorization is terminated or revoked.     Resp Syncytial Virus by PCR NEGATIVE NEGATIVE Final    Comment: (NOTE) Fact Sheet for Patients: BloggerCourse.com  Fact Sheet for Healthcare Providers: SeriousBroker.it  This test is not yet approved or cleared by the [place] FDA and has been authorized for detection and/or diagnosis of SARS-CoV-2 by FDA under an Emergency Use Authorization (EUA). This EUA will remain in effect (meaning this test can be used) for the duration of the COVID-19 declaration under Section 564(b)(1) of the Act, 21 U.S.C. section 360bbb-3(b)(1), unless the authorization is terminated or revoked.  Performed at [facility], [address] [place], [place] [address]   Culture, blood (Routine X 2) w Reflex to ID Panel     Status: None (Preliminary result)   Collection Time: 05/13/23  2:50 AM   Specimen: BLOOD  Result Value Ref Range Status   Specimen Description BLOOD BLOOD LEFT ARM  Final   Special Requests   Final    BOTTLES DRAWN AEROBIC AND ANAEROBIC Blood Culture adequate volume   Culture   Final    NO GROWTH < 12 HOURS Performed at [facility], [address], [place], [place] [address]    Report Status PENDING  Incomplete  Culture, blood (Routine X 2) w Reflex to ID Panel  Status: None (Preliminary result)   Collection Time: 05/13/23  2:50 AM   Specimen: BLOOD   Result Value Ref Range Status   Specimen Description BLOOD BLOOD RIGHT HAND  Final   Special Requests AEROBIC BOTTLE ONLY Blood Culture adequate volume  Final   Culture   Final    NO GROWTH < 12 HOURS Performed at [facility], [address], [place], [place] [address]    Report Status PENDING  Incomplete         Radiology Studies: DG CHEST PORT 1 VIEW Result Date: 05/12/2023 CLINICAL DATA:  Shortness of breath. EXAM: PORTABLE CHEST 1 VIEW COMPARISON:  One-view chest x-ray 05/09/2023 FINDINGS: The patient has been extubated.  Gastric tube was removed. The heart size is normal. Atherosclerotic changes are present at the aortic arch progressive interstitial and airspace disease is present bilaterally. Progressive bilateral effusions are present. IMPRESSION: 1. Progressive interstitial and airspace disease bilaterally consistent with progressive bilateral pneumonia and edema. 2. Progressive bilateral effusions. Electronically Signed   By: Marin Roberts M.D.   On: 05/12/2023 14:17   DG Abd Portable 1V Result Date: 05/12/2023 CLINICAL DATA:  Follow up small bowel obstruction. EXAM: PORTABLE ABDOMEN - 1 VIEW COMPARISON:  Radiographs 05/11/2023 and 05/10/2023.  CT 05/08/2023. FINDINGS: 0830 hours. 2 portable supine views of the abdomen are submitted. Previously administered enteric contrast remains within nondilated colon. No significant retained contrast within the small bowel. There are few mildly dilated loops of small bowel centrally. No evidence of contrast extravasation. Multiple surgical clips are noted. Enteric tube has been removed. IMPRESSION: Previously administered enteric contrast remains within nondilated colon. No significant retained contrast within the small bowel. Electronically Signed   By: Carey Bullocks M.D.   On: 05/12/2023 12:30   ECHOCARDIOGRAM COMPLETE Result Date: 05/12/2023    ECHOCARDIOGRAM REPORT   Patient Name:   ELESE RANE Date of Exam: 05/12/2023  Medical Rec #:  [id number]       Height:       60.0 in Accession #:    [id number]      Weight:       135.6 lb Date of Birth:  12-09-1932       BSA:          1.582 m Patient Age:    89 years        BP:           158/93 mmHg Patient Gender: F               HR:           106 bpm. Exam Location:  Inpatient Procedure: 2D Echo, Cardiac Doppler and Color Doppler (Both Spectral and Color            Flow Doppler were utilized during procedure). Indications:    I48.91* Unspecified atrial fibrillation  History:        Patient has prior history of Echocardiogram examinations, most                 recent 05/01/2017. Arrythmias:Atrial Fibrillation; Risk                 Factors:Hypertension and Dyslipidemia.  Sonographer:    Irving Burton Senior RDCS Referring Phys: [id number] [address] V Vaidehi Braddy IMPRESSIONS  1. Left ventricular ejection fraction, by estimation, is 70 to 75%. Left ventricular ejection fraction by PLAX is 75 %. The left ventricle has hyperdynamic function. The left ventricle has no regional wall motion abnormalities. Left ventricular diastolic  parameters are consistent with Grade I diastolic dysfunction (impaired relaxation).  2. Right ventricular systolic function is normal. The right ventricular size is normal. There is normal pulmonary artery systolic pressure. The estimated right ventricular systolic pressure is 29.8 mmHg.  3. The mitral valve is normal in structure. Mild mitral valve regurgitation. Moderate mitral annular calcification.  4. The aortic valve is tricuspid. Aortic valve regurgitation is not visualized. Aortic valve sclerosis is present, with no evidence of aortic valve stenosis.  5. The inferior vena cava is normal in size with greater than 50% respiratory variability, suggesting right atrial pressure of 3 mmHg. FINDINGS  Left Ventricle: Left ventricular ejection fraction, by estimation, is 70 to 75%. Left ventricular ejection fraction by PLAX is 75 %. The left ventricle has hyperdynamic function. The left  ventricle has no regional wall motion abnormalities. The left ventricular internal cavity size was normal in size. There is no left ventricular hypertrophy. Left ventricular diastolic parameters are consistent with Grade I diastolic dysfunction (impaired relaxation). Right Ventricle: The right ventricular size is normal. No increase in right ventricular wall thickness. Right ventricular systolic function is normal. There is normal pulmonary artery systolic pressure. The tricuspid regurgitant velocity is 2.59 m/s, and  with an assumed right atrial pressure of 3 mmHg, the estimated right ventricular systolic pressure is 29.8 mmHg. Left Atrium: Left atrial size was normal in size. Right Atrium: Right atrial size was normal in size. Pericardium: Trivial pericardial effusion is present. Mitral Valve: The mitral valve is normal in structure. Moderate mitral annular calcification. Mild mitral valve regurgitation. Tricuspid Valve: The tricuspid valve is normal in structure. Tricuspid valve regurgitation is trivial. Aortic Valve: The aortic valve is tricuspid. Aortic valve regurgitation is not visualized. Aortic valve sclerosis is present, with no evidence of aortic valve stenosis. Pulmonic Valve: The pulmonic valve was normal in structure. Pulmonic valve regurgitation is trivial. Aorta: The aortic root and ascending aorta are structurally normal, with no evidence of dilitation. Venous: The inferior vena cava is normal in size with greater than 50% respiratory variability, suggesting right atrial pressure of 3 mmHg. IAS/Shunts: No atrial level shunt detected by color flow Doppler.  LEFT VENTRICLE PLAX 2D LV EF:         Left            Diastology                ventricular     LV e' medial:    9.14 cm/s                ejection        LV E/e' medial:  8.4                fraction by     LV e' lateral:   9.79 cm/s                PLAX is 75      LV E/e' lateral: 7.8                %. LVIDd:         3.30 cm LVIDs:         1.90 cm  LV PW:         0.80 cm LV IVS:        1.00 cm LVOT diam:     1.90 cm LV SV:         45 LV SV Index:   29 LVOT Area:  2.84 cm  RIGHT VENTRICLE RV S prime:     15.10 cm/s TAPSE (M-mode): 1.9 cm LEFT ATRIUM             Index        RIGHT ATRIUM          Index LA diam:        2.80 cm 1.77 cm/m   RA Area:     8.27 cm LA Vol (A2C):   22.2 ml 14.03 ml/m  RA Volume:   13.60 ml 8.59 ml/m LA Vol (A4C):   36.3 ml 22.94 ml/m LA Biplane Vol: 30.3 ml 19.15 ml/m  AORTIC VALVE LVOT Vmax:   94.20 cm/s LVOT Vmean:  61.700 cm/s LVOT VTI:    0.160 m  AORTA Ao Root diam: 2.70 cm Ao Asc diam:  2.60 cm MITRAL VALVE                TRICUSPID VALVE MV Area (PHT): 2.49 cm     TR Peak grad:   26.8 mmHg MV Decel Time: 305 msec     TR Vmax:        259.00 cm/s MV E velocity: 76.50 cm/s MV A velocity: 106.00 cm/s  SHUNTS MV E/A ratio:  0.72         Systemic VTI:  0.16 m                             Systemic Diam: 1.90 cm Clearnce Hasten Electronically signed by Clearnce Hasten Signature Date/Time: 05/12/2023/10:04:37 AM    Final         Scheduled Meds:  ARIPiprazole  5 mg Oral Daily   benztropine  0.5 mg Oral QHS   Chlorhexidine Gluconate Cloth  6 each Topical Daily   feeding supplement  1 Container Oral TID BM   furosemide  20 mg Intravenous Q12H   insulin aspart  0-9 Units Subcutaneous Q4H   [START ON 05/14/2023] levothyroxine  88 mcg Oral QAC breakfast   metoprolol tartrate  5 mg Intravenous Q6H   mouth rinse  15 mL Mouth Rinse 4 times per day   pantoprazole (PROTONIX) IV  40 mg Intravenous QHS   potassium chloride  40 mEq Oral Q4H   venlafaxine XR  150 mg Oral q AM   Continuous Infusions:  amiodarone 30 mg/hr (05/13/23 0800)   cefTRIAXone (ROCEPHIN)  IV     heparin 850 Units/hr (05/13/23 0800)   magnesium sulfate bolus IVPB 4 g (05/13/23 1000)   metronidazole       LOS: 4 days    Time spent: 50 minutes    Ramiro Harvest, MD [facility]   To contact the attending provider between 7A-7P or  the covering provider during after hours 7P-7A, please log into the web site [email or web address] and access using universal [place] password for that web site. If you do not have the password, please call the hospital operator.  05/13/2023, 10:13 AM

## 2023-05-13 NOTE — Progress Notes (Signed)
 PHARMACY - ANTICOAGULATION CONSULT NOTE  Pharmacy Consult for heparin gtt Indication: atrial fibrillation  Allergies  Allergen Reactions   Penicillins Itching and Rash   Hydrocodone Other (See Comments)    MAO-I ; cannot take medications.    Morphine And Codeine     Unable to take due to MAO-I   Morphine Sulfate Other (See Comments)   Sulfamethoxazole-Trimethoprim Nausea Only, Other (See Comments) and Nausea And Vomiting    And diarrhea.  Other Reaction(s): GI Intolerance    Patient Measurements: Height: 5' (152.4 cm) Weight: 64.8 kg (142 lb 13.7 oz) IBW/kg (Calculated) : 45.5 Heparin Dosing Weight: 61.5 kg  Vital Signs: Temp: 97.8 F (36.6 C) (03/01 0834) Temp Source: Oral (03/01 0834) BP: 91/58 (03/01 1000) Pulse Rate: 80 (03/01 1000)  Labs: Recent Labs    05/11/23 1708 05/12/23 0438 05/12/23 1151 05/13/23 0250 05/13/23 1010  HGB  --   --  13.6 11.5*  --   HCT  --   --  41.0 33.4*  --   PLT  --   --  107* 120*  --   HEPARINUNFRC  --   --   --  0.38 0.32  CREATININE 0.67 0.78  --  0.75  --     Estimated Creatinine Clearance: 40.8 mL/min (by C-G formula based on SCr of 0.75 mg/dL).   Medical History: Past Medical History:  Diagnosis Date   Anxiety    Arthritis    Bipolar disorder (HCC)    Depression    Hyperlipidemia    Hypothyroidism    Irritable bowel syndrome    RA (rheumatoid arthritis) (HCC)    Assessment: 89yo F with afib. Pharmacy consulted to dose heparin for anticoagulation. Surgery team following for partial SBO - no indications for emergent surgery for now.  Hgb 11.5, Plt 120. HL therapeutic at 0.32 (confirmatory).   Goal of Therapy:  Heparin level 0.3-0.7 units/ml Monitor platelets by anticoagulation protocol: Yes   Plan:  Heparin 900 units/hr, slight increase. Levels are therapeutic but on lower end.  Daily HL, CBC F/u long term anticoagulation plans and surgical needs.   Cedric Fishman, PharmD, BCPS, BCCCP Clinical  Pharmacist

## 2023-05-13 NOTE — Progress Notes (Signed)
 1630 bladder scan showed 471 cc   I/O at 1800 - 650 cc

## 2023-05-13 NOTE — Progress Notes (Signed)
 VASCULAR LAB    Right upper extremity venous duplex has been performed.  See CV proc for preliminary results.  Gave verbal report to Gerilyn Nestle, RN  Sherren Kerns, RVT 05/13/2023, 4:34 PM

## 2023-05-13 NOTE — Progress Notes (Addendum)
 2334: Pt spiked new fever of 100.6. Pt also having soft BP since administration of scheduled IV metoprolol. Pt converted from Afib RVR to ST shortly after metoprolol admin. Pt mentation remains unchanged. Dolly Rias, MD notified of these findings. MD okay w/ MAP >65 for BP. Orders placed for resp panel and blood cx for fever follow-up.   0410: Pt's R-arm has progressively become more edematous and erythemas throughout shift w/ pt now having c/o dull pain in arm with movement. Pt arm is hot to touch. Pt cap refill <3 sec in fingers w/ palpable radial pulse. Sensation in arm is intact. Segars, MD notified. MD requested photo be added to pt med record. Photo uploaded per request after obtaining consent from pt. Orders placed for duplex study and to continue to monitor for any spread of erythema.   0530: AM K+: 3.0. Segars, MD notified. No new orders at this time.

## 2023-05-13 NOTE — Progress Notes (Signed)
 PHARMACY - ANTICOAGULATION CONSULT NOTE  Pharmacy Consult for heparin Indication: atrial fibrillation  Labs: Recent Labs    05/10/23 0528 05/11/23 0338 05/11/23 1708 05/12/23 0438 05/12/23 1151 05/13/23 0250  HGB 13.4  --   --   --  13.6 11.5*  HCT 40.4  --   --   --  41.0 33.4*  PLT 98*  --   --   --  107* 120*  HEPARINUNFRC  --   --   --   --   --  0.38  CREATININE 1.12*   < > 0.67 0.78  --  0.75   < > = values in this interval not displayed.   Assessment/Plan:  89yo female therapeutic on heparin with initial dosing for Afib. Will continue infusion at current rate of 850 units/hr and confirm stable with additional level.  Vernard Gambles, PharmD, BCPS 05/13/2023 3:21 AM

## 2023-05-13 NOTE — Progress Notes (Signed)
 Subjective/Chief Complaint: Patient had a bm last night after Dulcolax suppository Still feels distended today Tolerating clears without nausea or vomiting   Objective: Vital signs in last 24 hours: Temp:  [97.8 F (36.6 C)-100.6 F (38.1 C)] 97.8 F (36.6 C) (03/01 0834) Pulse Rate:  [80-147] 80 (03/01 1000) Resp:  [23-42] 29 (03/01 1000) BP: (89-153)/(58-120) 91/58 (03/01 1000) SpO2:  [90 %-100 %] 95 % (03/01 1000) Weight:  [64.8 kg] 64.8 kg (03/01 0500) Last BM Date :  (PTA)  Intake/Output from previous day: 02/28 0701 - 03/01 0700 In: 1444.7 [I.V.:773.4; IV Piggyback:671.4] Out: 1550 [Urine:1550] Intake/Output this shift: Total I/O In: 38.2 [I.V.:38.2] Out: -  Abd - mildly distended; less tense than yesterday  Lab Results:  Recent Labs    05/12/23 1151 05/13/23 0250  WBC 15.0* 11.6*  HGB 13.6 11.5*  HCT 41.0 33.4*  PLT 107* 120*   BMET Recent Labs    05/12/23 0438 05/13/23 0250  NA 139 134*  K 3.4* 3.0*  CL 105 96*  CO2 19* 21*  GLUCOSE 99 240*  BUN 10 10  CREATININE 0.78 0.75  CALCIUM 7.6* 7.3*   PT/INR No results for input(s): "LABPROT", "INR" in the last 72 hours. ABG No results for input(s): "PHART", "HCO3" in the last 72 hours.  Invalid input(s): "PCO2", "PO2"  Studies/Results: DG CHEST PORT 1 VIEW Result Date: 05/12/2023 CLINICAL DATA:  Shortness of breath. EXAM: PORTABLE CHEST 1 VIEW COMPARISON:  One-view chest x-ray 05/09/2023 FINDINGS: The patient has been extubated.  Gastric tube was removed. The heart size is normal. Atherosclerotic changes are present at the aortic arch progressive interstitial and airspace disease is present bilaterally. Progressive bilateral effusions are present. IMPRESSION: 1. Progressive interstitial and airspace disease bilaterally consistent with progressive bilateral pneumonia and edema. 2. Progressive bilateral effusions. Electronically Signed   By: Marin Roberts M.D.   On: 05/12/2023 14:17   DG Abd  Portable 1V Result Date: 05/12/2023 CLINICAL DATA:  Follow up small bowel obstruction. EXAM: PORTABLE ABDOMEN - 1 VIEW COMPARISON:  Radiographs 05/11/2023 and 05/10/2023.  CT 05/08/2023. FINDINGS: 0830 hours. 2 portable supine views of the abdomen are submitted. Previously administered enteric contrast remains within nondilated colon. No significant retained contrast within the small bowel. There are few mildly dilated loops of small bowel centrally. No evidence of contrast extravasation. Multiple surgical clips are noted. Enteric tube has been removed. IMPRESSION: Previously administered enteric contrast remains within nondilated colon. No significant retained contrast within the small bowel. Electronically Signed   By: Carey Bullocks M.D.   On: 05/12/2023 12:30   ECHOCARDIOGRAM COMPLETE Result Date: 05/12/2023    ECHOCARDIOGRAM REPORT   Patient Name:   Erica [name] Date of Exam: 05/12/2023 Medical Rec #:  [id number]       Height:       60.0 in Accession #:    [id number]      Weight:       135.6 lb Date of Birth:  05-16-1932       BSA:          1.582 m Patient Age:    89 years        BP:           158/93 mmHg Patient Gender: F               HR:           106 bpm. Exam Location:  Inpatient Procedure: 2D Echo, Cardiac Doppler and Color Doppler (Both  Spectral and Color            Flow Doppler were utilized during procedure). Indications:    I48.91* Unspecified atrial fibrillation  History:        Patient has prior history of Echocardiogram examinations, most                 recent 05/01/2017. Arrythmias:Atrial Fibrillation; Risk                 Factors:Hypertension and Dyslipidemia.  Sonographer:    Irving Burton Senior RDCS Referring Phys: [id number] DANIEL V THOMPSON IMPRESSIONS  1. Left ventricular ejection fraction, by estimation, is 70 to 75%. Left ventricular ejection fraction by PLAX is 75 %. The left ventricle has hyperdynamic function. The left ventricle has no regional wall motion abnormalities. Left ventricular  diastolic parameters are consistent with Grade I diastolic dysfunction (impaired relaxation).  2. Right ventricular systolic function is normal. The right ventricular size is normal. There is normal pulmonary artery systolic pressure. The estimated right ventricular systolic pressure is 29.8 mmHg.  3. The mitral valve is normal in structure. Mild mitral valve regurgitation. Moderate mitral annular calcification.  4. The aortic valve is tricuspid. Aortic valve regurgitation is not visualized. Aortic valve sclerosis is present, with no evidence of aortic valve stenosis.  5. The inferior vena cava is normal in size with greater than 50% respiratory variability, suggesting right atrial pressure of 3 mmHg. FINDINGS  Left Ventricle: Left ventricular ejection fraction, by estimation, is 70 to 75%. Left ventricular ejection fraction by PLAX is 75 %. The left ventricle has hyperdynamic function. The left ventricle has no regional wall motion abnormalities. The left ventricular internal cavity size was normal in size. There is no left ventricular hypertrophy. Left ventricular diastolic parameters are consistent with Grade I diastolic dysfunction (impaired relaxation). Right Ventricle: The right ventricular size is normal. No increase in right ventricular wall thickness. Right ventricular systolic function is normal. There is normal pulmonary artery systolic pressure. The tricuspid regurgitant velocity is 2.59 m/s, and  with an assumed right atrial pressure of 3 mmHg, the estimated right ventricular systolic pressure is 29.8 mmHg. Left Atrium: Left atrial size was normal in size. Right Atrium: Right atrial size was normal in size. Pericardium: Trivial pericardial effusion is present. Mitral Valve: The mitral valve is normal in structure. Moderate mitral annular calcification. Mild mitral valve regurgitation. Tricuspid Valve: The tricuspid valve is normal in structure. Tricuspid valve regurgitation is trivial. Aortic Valve: The  aortic valve is tricuspid. Aortic valve regurgitation is not visualized. Aortic valve sclerosis is present, with no evidence of aortic valve stenosis. Pulmonic Valve: The pulmonic valve was normal in structure. Pulmonic valve regurgitation is trivial. Aorta: The aortic root and ascending aorta are structurally normal, with no evidence of dilitation. Venous: The inferior vena cava is normal in size with greater than 50% respiratory variability, suggesting right atrial pressure of 3 mmHg. IAS/Shunts: No atrial level shunt detected by color flow Doppler.  LEFT VENTRICLE PLAX 2D LV EF:         Left            Diastology                ventricular     LV e' medial:    9.14 cm/s                ejection        LV E/e' medial:  8.4  fraction by     LV e' lateral:   9.79 cm/s                PLAX is 75      LV E/e' lateral: 7.8                %. LVIDd:         3.30 cm LVIDs:         1.90 cm LV PW:         0.80 cm LV IVS:        1.00 cm LVOT diam:     1.90 cm LV SV:         45 LV SV Index:   29 LVOT Area:     2.84 cm  RIGHT VENTRICLE RV S prime:     15.10 cm/s TAPSE (M-mode): 1.9 cm LEFT ATRIUM             Index        RIGHT ATRIUM          Index LA diam:        2.80 cm 1.77 cm/m   RA Area:     8.27 cm LA Vol (A2C):   22.2 ml 14.03 ml/m  RA Volume:   13.60 ml 8.59 ml/m LA Vol (A4C):   36.3 ml 22.94 ml/m LA Biplane Vol: 30.3 ml 19.15 ml/m  AORTIC VALVE LVOT Vmax:   94.20 cm/s LVOT Vmean:  61.700 cm/s LVOT VTI:    0.160 m  AORTA Ao Root diam: 2.70 cm Ao Asc diam:  2.60 cm MITRAL VALVE                TRICUSPID VALVE MV Area (PHT): 2.49 cm     TR Peak grad:   26.8 mmHg MV Decel Time: 305 msec     TR Vmax:        259.00 cm/s MV E velocity: 76.50 cm/s MV A velocity: 106.00 cm/s  SHUNTS MV E/A ratio:  0.72         Systemic VTI:  0.16 m                             Systemic Diam: 1.90 cm Clearnce Hasten Electronically signed by Clearnce Hasten Signature Date/Time: 05/12/2023/10:04:37 AM    Final      Anti-infectives: Anti-infectives (From admission, onward)    Start     Dose/Rate Route Frequency Ordered Stop   05/13/23 2200  metroNIDAZOLE (FLAGYL) IVPB 500 mg        500 mg 100 mL/hr over 60 Minutes Intravenous Every 12 hours 05/13/23 1005 05/15/23 2159   05/13/23 1100  cefTRIAXone (ROCEPHIN) 2 g in sodium chloride 0.9 % 100 mL IVPB        2 g 200 mL/hr over 30 Minutes Intravenous Every 24 hours 05/13/23 1005 05/15/23 1059   05/09/23 1000  cefTRIAXone (ROCEPHIN) 2 g in sodium chloride 0.9 % 100 mL IVPB        2 g 200 mL/hr over 30 Minutes Intravenous Every 24 hours 05/09/23 0906 05/13/23 0923   05/09/23 1000  metroNIDAZOLE (FLAGYL) IVPB 500 mg  Status:  Discontinued        500 mg 100 mL/hr over 60 Minutes Intravenous Every 12 hours 05/09/23 0906 05/13/23 1005   05/09/23 0430  levofloxacin (LEVAQUIN) IVPB 750 mg        750 mg 100 mL/hr over 90 Minutes  Intravenous  Once 05/09/23 0424 05/09/23 0602       Assessment/Plan: SBO - partial - CT w/ Distended fluid-filled stomach and small bowel into the pelvis. Distal small bowel decompressed. Findings compatible with distal small bowel obstruction. Large hiatal hernia, fluid-filled - No current indication for emergency surgery - aspiration with NGT placement - intubated and now s/p extubation - afebrile. WBC improving - contrast in colon on xray - NGT out overnight but had been decreasing in output. Advance to CLD but observe for aspiration risk - discussed with bedside RN - Repeat Dulcolax.  Continue daily Miralax - Keep K > 4 and Mg > 2 for bowel function, k 3.4 - getting iv repletion - Mobilize for bowel function   New afib RVR - if anticoagulation needed okay with heparin gtt from surgical standpoint   FEN: CLD. K repletion per primary, IVF per primary ID: rocephin/flagyl (aspiration) VTE: lovenox   Per primary: Acute onset afib RVR - on amio drip HLD IBS RA BPD H/o abdominal hysterectomy, h/o colostomy s/p  reversal    LOS: 4 days    Wynona Luna 05/13/2023

## 2023-05-13 NOTE — Progress Notes (Signed)
 DAILY PROGRESS NOTE   Patient Name: Erica [name] Date of Encounter: 05/13/2023 Cardiologist: Rollene Rotunda, MD  Chief Complaint   Sleepy  Patient Profile   Erica [name] with a hx of small bowel obstruction, hypertension, rheumatoid arthritis, bipolar disorder who is being seen 05/12/2023 for the evaluation of A-fib with RVR at the request of Ramiro Harvest MD   Subjective   Monitor shows possible sinus rhythm with PAC's and PVC's - remains on amiodarone and IV heparin, rate controlled. Slowly recovering bowel function.  Objective   Vitals:   05/13/23 0800 05/13/23 0834 05/13/23 0900 05/13/23 1000  BP: 104/71  105/81 (!) 91/58  Pulse: 97 (!) 101 99 80  Resp: (!) 25 (!) 24 (!) 28 (!) 29  Temp:  97.8 F (36.6 C)    TempSrc:  Oral    SpO2: 95% 93% 90% 95%  Weight:      Height:        Intake/Output Summary (Last 24 hours) at 05/13/2023 1049 Last data filed at 05/13/2023 0800 Gross per 24 hour  Intake 1482.9 ml  Output 1550 ml  Net -67.1 ml   Filed Weights   05/10/23 0500 05/11/23 0500 05/13/23 0500  Weight: 58.1 kg 61.5 kg 64.8 kg    Physical Exam   General appearance: alert and no distress Lungs: diminished breath sounds bibasilar Heart: irregularly irregular rhythm Extremities: edema 1+ bilateral LE and 2+ RUE edema Neurologic: Grossly normal  Inpatient Medications    Scheduled Meds:  ARIPiprazole  5 mg Oral Daily   benztropine  0.5 mg Oral QHS   bisacodyl  10 mg Rectal Once   Chlorhexidine Gluconate Cloth  6 each Topical Daily   feeding supplement  1 Container Oral TID BM   furosemide  20 mg Intravenous Q12H   insulin aspart  0-9 Units Subcutaneous Q4H   [START ON 05/14/2023] levothyroxine  88 mcg Oral Q0600   metoprolol tartrate  5 mg Intravenous Q6H   mouth rinse  15 mL Mouth Rinse 4 times per day   pantoprazole (PROTONIX) IV  40 mg Intravenous QHS   potassium chloride  40 mEq Oral Q4H   venlafaxine XR  150 mg Oral q AM     Continuous Infusions:  amiodarone 30 mg/hr (05/13/23 0800)   [START ON 05/14/2023] cefTRIAXone (ROCEPHIN)  IV     heparin 850 Units/hr (05/13/23 0800)   magnesium sulfate bolus IVPB 4 g (05/13/23 1000)   metronidazole      PRN Meds: docusate sodium, LORazepam, melatonin, mouth rinse, polyethylene glycol, prochlorperazine   Labs   Results for orders placed or performed during the hospital encounter of 05/08/23 (from the past 48 hours)  Glucose, capillary     Status: Abnormal   Collection Time: 05/11/23 11:44 AM  Result Value Ref Range   Glucose-Capillary 108 (H) 70 - 99 mg/dL    Comment: Glucose reference range applies only to samples taken after fasting for at least 8 hours.  Glucose, capillary     Status: None   Collection Time: 05/11/23  3:05 PM  Result Value Ref Range   Glucose-Capillary 87 70 - 99 mg/dL    Comment: Glucose reference range applies only to samples taken after fasting for at least 8 hours.  Basic metabolic panel     Status: Abnormal   Collection Time: 05/11/23  5:08 PM  Result Value Ref Range   Sodium 139 135 - 145 mmol/L   Potassium 4.0  3.5 - 5.1 mmol/L   Chloride 107 98 - 111 mmol/L   CO2 20 (L) 22 - 32 mmol/L   Glucose, Bld 97 70 - 99 mg/dL    Comment: Glucose reference range applies only to samples taken after fasting for at least 8 hours.   BUN 9 8 - 23 mg/dL   Creatinine, Ser 1.61 0.44 - 1.00 mg/dL   Calcium 7.9 (L) 8.9 - 10.3 mg/dL   GFR, Estimated >09 >60 mL/min    Comment: (NOTE) Calculated using the CKD-EPI Creatinine Equation (2021)    Anion gap 12 5 - 15    Comment: Performed at [facility], [address], [place], [place] [address]  Glucose, capillary     Status: None   Collection Time: 05/11/23  7:47 PM  Result Value Ref Range   Glucose-Capillary 80 70 - 99 mg/dL    Comment: Glucose reference range applies only to samples taken after fasting for at least 8 hours.  Glucose, capillary     Status: None   Collection Time:  05/11/23 11:44 PM  Result Value Ref Range   Glucose-Capillary 80 70 - 99 mg/dL    Comment: Glucose reference range applies only to samples taken after fasting for at least 8 hours.  Glucose, capillary     Status: None   Collection Time: 05/12/23  3:28 AM  Result Value Ref Range   Glucose-Capillary 82 70 - 99 mg/dL    Comment: Glucose reference range applies only to samples taken after fasting for at least 8 hours.  Basic metabolic panel     Status: Abnormal   Collection Time: 05/12/23  4:38 AM  Result Value Ref Range   Sodium 139 135 - 145 mmol/L   Potassium 3.4 (L) 3.5 - 5.1 mmol/L   Chloride 105 98 - 111 mmol/L   CO2 19 (L) 22 - 32 mmol/L   Glucose, Bld 99 70 - 99 mg/dL    Comment: Glucose reference range applies only to samples taken after fasting for at least 8 hours.   BUN 10 8 - 23 mg/dL   Creatinine, Ser 8.11 0.44 - 1.00 mg/dL   Calcium 7.6 (L) 8.9 - 10.3 mg/dL   GFR, Estimated >91 >47 mL/min    Comment: (NOTE) Calculated using the CKD-EPI Creatinine Equation (2021)    Anion gap 15 5 - 15    Comment: Performed at [facility], [address], [place], [place] [address]  Magnesium     Status: None   Collection Time: 05/12/23  4:38 AM  Result Value Ref Range   Magnesium 2.0 1.7 - 2.4 mg/dL    Comment: Performed at [facility], [address] [place], [place] [address]  Glucose, capillary     Status: None   Collection Time: 05/12/23  7:41 AM  Result Value Ref Range   Glucose-Capillary 93 70 - 99 mg/dL    Comment: Glucose reference range applies only to samples taken after fasting for at least 8 hours.  Glucose, capillary     Status: Abnormal   Collection Time: 05/12/23 11:33 AM  Result Value Ref Range   Glucose-Capillary 108 (H) 70 - 99 mg/dL    Comment: Glucose reference range applies only to samples taken after fasting for at least 8 hours.  CBC     Status: Abnormal   Collection Time: 05/12/23 11:51 AM  Result Value Ref Range   WBC 15.0 (H) 4.0 - 10.5  K/uL   RBC  4.27 3.87 - 5.11 MIL/uL   Hemoglobin 13.6 12.0 - 15.0 g/dL   HCT 84.6 96.2 - 95.2 %   MCV 96.0 80.0 - 100.0 fL   MCH 31.9 26.0 - 34.0 pg   MCHC 33.2 30.0 - 36.0 g/dL   RDW 84.1 32.4 - 40.1 %   Platelets 107 (L) 150 - 400 K/uL   nRBC 0.0 0.0 - 0.2 %    Comment: Performed at [facility], [address] [place], [place] [address]  Brain natriuretic peptide     Status: Abnormal   Collection Time: 05/12/23 11:51 AM  Result Value Ref Range   B Natriuretic Peptide 344.5 (H) 0.0 - 100.0 pg/mL    Comment: Performed at [facility], [address] [place], [place] [address]  Glucose, capillary     Status: None   Collection Time: 05/12/23  3:41 PM  Result Value Ref Range   Glucose-Capillary 87 70 - 99 mg/dL    Comment: Glucose reference range applies only to samples taken after fasting for at least 8 hours.  Glucose, capillary     Status: Abnormal   Collection Time: 05/12/23  7:32 PM  Result Value Ref Range   Glucose-Capillary 105 (H) 70 - 99 mg/dL    Comment: Glucose reference range applies only to samples taken after fasting for at least 8 hours.  Urinalysis, Complete w Microscopic -Urine, Catheterized     Status: Abnormal   Collection Time: 05/12/23 10:18 PM  Result Value Ref Range   Color, Urine YELLOW YELLOW   APPearance CLEAR CLEAR   Specific Gravity, Urine 1.012 1.005 - 1.030   pH 5.0 5.0 - 8.0   Glucose, UA NEGATIVE NEGATIVE mg/dL   Hgb urine dipstick SMALL (A) NEGATIVE   Bilirubin Urine NEGATIVE NEGATIVE   Ketones, ur 20 (A) NEGATIVE mg/dL   Protein, ur 30 (A) NEGATIVE mg/dL   Nitrite NEGATIVE NEGATIVE   Leukocytes,Ua NEGATIVE NEGATIVE   RBC / HPF 0-5 0 - 5 RBC/hpf   WBC, UA 0-5 0 - 5 WBC/hpf   Bacteria, UA NONE SEEN NONE SEEN   Squamous Epithelial / HPF 0-5 0 - 5 /HPF   Mucus PRESENT     Comment: Performed at [facility], [address] [place], [place] [address]  Glucose, capillary     Status: Abnormal   Collection Time: 05/12/23 11:23  PM  Result Value Ref Range   Glucose-Capillary 119 (H) 70 - 99 mg/dL    Comment: Glucose reference range applies only to samples taken after fasting for at least 8 hours.  Resp panel by RT-PCR (RSV, Flu A&B, Covid) Anterior Nasal Swab     Status: None   Collection Time: 05/13/23 12:58 AM   Specimen: Anterior Nasal Swab  Result Value Ref Range   SARS Coronavirus 2 by RT PCR NEGATIVE NEGATIVE   Influenza A by PCR NEGATIVE NEGATIVE   Influenza B by PCR NEGATIVE NEGATIVE    Comment: (NOTE) The Xpert Xpress SARS-CoV-2/FLU/RSV plus assay is intended as an aid in the diagnosis of influenza from Nasopharyngeal swab specimens and should not be used as a sole basis for treatment. Nasal washings and aspirates are unacceptable for Xpert Xpress SARS-CoV-2/FLU/RSV testing.  Fact Sheet for Patients: BloggerCourse.com  Fact Sheet for Healthcare Providers: SeriousBroker.it  This test is not yet approved or cleared by the [place] FDA and has been authorized for detection and/or diagnosis of SARS-CoV-2 by FDA under an Emergency Use Authorization (EUA). This  EUA will remain in effect (meaning this test can be used) for the duration of the COVID-19 declaration under Section 564(b)(1) of the Act, 21 U.S.C. section 360bbb-3(b)(1), unless the authorization is terminated or revoked.     Resp Syncytial Virus by PCR NEGATIVE NEGATIVE    Comment: (NOTE) Fact Sheet for Patients: BloggerCourse.com  Fact Sheet for Healthcare Providers: SeriousBroker.it  This test is not yet approved or cleared by the [place] FDA and has been authorized for detection and/or diagnosis of SARS-CoV-2 by FDA under an Emergency Use Authorization (EUA). This EUA will remain in effect (meaning this test can be used) for the duration of the COVID-19 declaration under Section 564(b)(1) of the Act, 21 U.S.C. section  360bbb-3(b)(1), unless the authorization is terminated or revoked.  Performed at [facility], [address] [place], [place] [address]   Heparin level (unfractionated)     Status: None   Collection Time: 05/13/23  2:50 AM  Result Value Ref Range   Heparin Unfractionated 0.38 0.30 - 0.70 IU/mL    Comment: (NOTE) The clinical reportable range upper limit is being lowered to >1.10 to align with the FDA approved guidance for the current laboratory assay.  If heparin results are below expected values, and patient dosage has  been confirmed, suggest follow up testing of antithrombin III levels. Performed at [facility], [address] [place], [place] [address]   Basic metabolic panel     Status: Abnormal   Collection Time: 05/13/23  2:50 AM  Result Value Ref Range   Sodium 134 (L) 135 - 145 mmol/L   Potassium 3.0 (L) 3.5 - 5.1 mmol/L   Chloride 96 (L) 98 - 111 mmol/L   CO2 21 (L) 22 - 32 mmol/L   Glucose, Bld 240 (H) 70 - 99 mg/dL    Comment: Glucose reference range applies only to samples taken after fasting for at least 8 hours.   BUN 10 8 - 23 mg/dL   Creatinine, Ser 0.98 0.44 - 1.00 mg/dL   Calcium 7.3 (L) 8.9 - 10.3 mg/dL   GFR, Estimated >11 >91 mL/min    Comment: (NOTE) Calculated using the CKD-EPI Creatinine Equation (2021)    Anion gap 17 (H) 5 - 15    Comment: Performed at [facility], [address], [place], [place] [address]  Magnesium     Status: Abnormal   Collection Time: 05/13/23  2:50 AM  Result Value Ref Range   Magnesium 1.5 (L) 1.7 - 2.4 mg/dL    Comment: Performed at [facility], [address], [place], [place] [address]  CBC with Differential/Platelet     Status: Abnormal   Collection Time: 05/13/23  2:50 AM  Result Value Ref Range   WBC 11.6 (H) 4.0 - 10.5 K/uL   RBC 3.54 (L) 3.87 - 5.11 MIL/uL   Hemoglobin 11.5 (L) 12.0 - 15.0 g/dL   HCT 08.6 (L) 57.8 - 46.9 %   MCV 94.4 80.0 - 100.0 fL   MCH 32.5 26.0 - 34.0 pg    MCHC 34.4 30.0 - 36.0 g/dL   RDW 62.9 52.8 - 41.3 %   Platelets 120 (L) 150 - 400 K/uL    Comment: REPEATED TO VERIFY   nRBC 0.0 0.0 - 0.2 %   Neutrophils Relative % 73 %   Neutro Abs 8.5 (H) 1.7 - 7.7 K/uL   Lymphocytes Relative 14 %   Lymphs Abs 1.7 0.7 - 4.0 K/uL  Monocytes Relative 10 %   Monocytes Absolute 1.1 (H) 0.1 - 1.0 K/uL   Eosinophils Relative 0 %   Eosinophils Absolute 0.0 0.0 - 0.5 K/uL   Basophils Relative 0 %   Basophils Absolute 0.0 0.0 - 0.1 K/uL   Immature Granulocytes 3 %   Abs Immature Granulocytes 0.31 (H) 0.00 - 0.07 K/uL    Comment: Performed at [facility], [address] [place], [place] [address]  TSH     Status: Abnormal   Collection Time: 05/13/23  2:50 AM  Result Value Ref Range   TSH 4.600 (H) 0.350 - 4.500 uIU/mL    Comment: Performed by a 3rd Generation assay with a functional sensitivity of <=0.01 uIU/mL. Performed at [facility], [address] [place], [place] [address]   Brain natriuretic peptide     Status: Abnormal   Collection Time: 05/13/23  2:50 AM  Result Value Ref Range   B Natriuretic Peptide 303.2 (H) 0.0 - 100.0 pg/mL    Comment: Performed at [facility], [address], [place], [place] [address]  Culture, blood (Routine X 2) w Reflex to ID Panel     Status: None (Preliminary result)   Collection Time: 05/13/23  2:50 AM   Specimen: BLOOD  Result Value Ref Range   Specimen Description BLOOD BLOOD LEFT ARM    Special Requests      BOTTLES DRAWN AEROBIC AND ANAEROBIC Blood Culture adequate volume   Culture      NO GROWTH < 12 HOURS Performed at [facility], [address], [place], [place] [address]    Report Status PENDING   Culture, blood (Routine X 2) w Reflex to ID Panel     Status: None (Preliminary result)   Collection Time: 05/13/23  2:50 AM   Specimen: BLOOD  Result Value Ref Range   Specimen Description BLOOD BLOOD RIGHT HAND    Special Requests AEROBIC BOTTLE ONLY Blood Culture  adequate volume    Culture      NO GROWTH < 12 HOURS Performed at [facility], [address] [place], [place] [address]    Report Status PENDING   Albumin     Status: Abnormal   Collection Time: 05/13/23  2:50 AM  Result Value Ref Range   Albumin 2.2 (L) 3.5 - 5.0 g/dL    Comment: Performed at [facility], [address] [place], [place] [address]  Glucose, capillary     Status: Abnormal   Collection Time: 05/13/23  3:32 AM  Result Value Ref Range   Glucose-Capillary 119 (H) 70 - 99 mg/dL    Comment: Glucose reference range applies only to samples taken after fasting for at least 8 hours.  Glucose, capillary     Status: Abnormal   Collection Time: 05/13/23  8:21 AM  Result Value Ref Range   Glucose-Capillary 109 (H) 70 - 99 mg/dL    Comment: Glucose reference range applies only to samples taken after fasting for at least 8 hours.    ECG   N/A  Telemetry   Sinus rhythm with PAC's and PVC's  - Personally Reviewed  Radiology    DG CHEST PORT 1 VIEW Result Date: 05/12/2023 CLINICAL DATA:  Shortness of breath. EXAM: PORTABLE CHEST 1 VIEW COMPARISON:  One-view chest x-ray 05/09/2023 FINDINGS: The patient has been extubated.  Gastric tube was removed. The heart size is normal. Atherosclerotic changes are present at the aortic arch progressive  interstitial and airspace disease is present bilaterally. Progressive bilateral effusions are present. IMPRESSION: 1. Progressive interstitial and airspace disease bilaterally consistent with progressive bilateral pneumonia and edema. 2. Progressive bilateral effusions. Electronically Signed   By: Marin Roberts M.D.   On: 05/12/2023 14:17   DG Abd Portable 1V Result Date: 05/12/2023 CLINICAL DATA:  Follow up small bowel obstruction. EXAM: PORTABLE ABDOMEN - 1 VIEW COMPARISON:  Radiographs 05/11/2023 and 05/10/2023.  CT 05/08/2023. FINDINGS: 0830 hours. 2 portable supine views of the abdomen are submitted. Previously  administered enteric contrast remains within nondilated colon. No significant retained contrast within the small bowel. There are few mildly dilated loops of small bowel centrally. No evidence of contrast extravasation. Multiple surgical clips are noted. Enteric tube has been removed. IMPRESSION: Previously administered enteric contrast remains within nondilated colon. No significant retained contrast within the small bowel. Electronically Signed   By: Carey Bullocks M.D.   On: 05/12/2023 12:30   ECHOCARDIOGRAM COMPLETE Result Date: 05/12/2023    ECHOCARDIOGRAM REPORT   Patient Name:   KAYLIEE ATIENZA Date of Exam: 05/12/2023 Medical Rec #:  [id number]       Height:       60.0 in Accession #:    [id number]      Weight:       135.6 lb Date of Birth:  1932-05-06       BSA:          1.582 m Patient Age:    89 years        BP:           158/93 mmHg Patient Gender: F               HR:           106 bpm. Exam Location:  Inpatient Procedure: 2D Echo, Cardiac Doppler and Color Doppler (Both Spectral and Color            Flow Doppler were utilized during procedure). Indications:    I48.91* Unspecified atrial fibrillation  History:        Patient has prior history of Echocardiogram examinations, most                 recent 05/01/2017. Arrythmias:Atrial Fibrillation; Risk                 Factors:Hypertension and Dyslipidemia.  Sonographer:    Irving Burton Senior RDCS Referring Phys: [id number] DANIEL V THOMPSON IMPRESSIONS  1. Left ventricular ejection fraction, by estimation, is 70 to 75%. Left ventricular ejection fraction by PLAX is 75 %. The left ventricle has hyperdynamic function. The left ventricle has no regional wall motion abnormalities. Left ventricular diastolic parameters are consistent with Grade I diastolic dysfunction (impaired relaxation).  2. Right ventricular systolic function is normal. The right ventricular size is normal. There is normal pulmonary artery systolic pressure. The estimated right ventricular systolic  pressure is 29.8 mmHg.  3. The mitral valve is normal in structure. Mild mitral valve regurgitation. Moderate mitral annular calcification.  4. The aortic valve is tricuspid. Aortic valve regurgitation is not visualized. Aortic valve sclerosis is present, with no evidence of aortic valve stenosis.  5. The inferior vena cava is normal in size with greater than 50% respiratory variability, suggesting right atrial pressure of 3 mmHg. FINDINGS  Left Ventricle: Left ventricular ejection fraction, by estimation, is 70 to 75%. Left ventricular ejection fraction by PLAX is 75 %. The left ventricle has hyperdynamic function. The left ventricle has no regional  wall motion abnormalities. The left ventricular internal cavity size was normal in size. There is no left ventricular hypertrophy. Left ventricular diastolic parameters are consistent with Grade I diastolic dysfunction (impaired relaxation). Right Ventricle: The right ventricular size is normal. No increase in right ventricular wall thickness. Right ventricular systolic function is normal. There is normal pulmonary artery systolic pressure. The tricuspid regurgitant velocity is 2.59 m/s, and  with an assumed right atrial pressure of 3 mmHg, the estimated right ventricular systolic pressure is 29.8 mmHg. Left Atrium: Left atrial size was normal in size. Right Atrium: Right atrial size was normal in size. Pericardium: Trivial pericardial effusion is present. Mitral Valve: The mitral valve is normal in structure. Moderate mitral annular calcification. Mild mitral valve regurgitation. Tricuspid Valve: The tricuspid valve is normal in structure. Tricuspid valve regurgitation is trivial. Aortic Valve: The aortic valve is tricuspid. Aortic valve regurgitation is not visualized. Aortic valve sclerosis is present, with no evidence of aortic valve stenosis. Pulmonic Valve: The pulmonic valve was normal in structure. Pulmonic valve regurgitation is trivial. Aorta: The aortic root  and ascending aorta are structurally normal, with no evidence of dilitation. Venous: The inferior vena cava is normal in size with greater than 50% respiratory variability, suggesting right atrial pressure of 3 mmHg. IAS/Shunts: No atrial level shunt detected by color flow Doppler.  LEFT VENTRICLE PLAX 2D LV EF:         Left            Diastology                ventricular     LV e' medial:    9.14 cm/s                ejection        LV E/e' medial:  8.4                fraction by     LV e' lateral:   9.79 cm/s                PLAX is 75      LV E/e' lateral: 7.8                %. LVIDd:         3.30 cm LVIDs:         1.90 cm LV PW:         0.80 cm LV IVS:        1.00 cm LVOT diam:     1.90 cm LV SV:         45 LV SV Index:   29 LVOT Area:     2.84 cm  RIGHT VENTRICLE RV S prime:     15.10 cm/s TAPSE (M-mode): 1.9 cm LEFT ATRIUM             Index        RIGHT ATRIUM          Index LA diam:        2.80 cm 1.77 cm/m   RA Area:     8.27 cm LA Vol (A2C):   22.2 ml 14.03 ml/m  RA Volume:   13.60 ml 8.59 ml/m LA Vol (A4C):   36.3 ml 22.94 ml/m LA Biplane Vol: 30.3 ml 19.15 ml/m  AORTIC VALVE LVOT Vmax:   94.20 cm/s LVOT Vmean:  61.700 cm/s LVOT VTI:    0.160 m  AORTA Ao Root diam: 2.70 cm Ao Asc diam:  2.60 cm MITRAL VALVE                TRICUSPID VALVE MV Area (PHT): 2.49 cm     TR Peak grad:   26.8 mmHg MV Decel Time: 305 msec     TR Vmax:        259.00 cm/s MV E velocity: 76.50 cm/s MV A velocity: 106.00 cm/s  SHUNTS MV E/A ratio:  0.72         Systemic VTI:  0.16 m                             Systemic Diam: 1.90 cm Clearnce Hasten Electronically signed by Clearnce Hasten Signature Date/Time: 05/12/2023/10:04:37 AM    Final     Cardiac Studies   See echo above  Assessment   Principal Problem:   Acute respiratory failure with hypoxia (HCC) Active Problems:   GERD (gastroesophageal reflux disease)   Depression   Small bowel obstruction (HCC)   Hypothyroidism   Major depressive disorder, recurrent  episode with anxious distress (HCC)   Rheumatoid aortitis   Bipolar disorder with severe depression (HCC)   Hypokalemia   Obstruction of bowel (HCC)   Atrial fib/flutter, transient (HCC)   SBO (small bowel obstruction) (HCC)   Pressure injury of skin   Aspiration pneumonia (HCC)   Acute diastolic CHF (congestive heart failure) (HCC)   Plan   Will continue with IV amiodarone and heparin. Significant edema - diuresed minimally yesterday - still volume up. BNP 303. Creatinine 0.75.  Time Spent Directly with Patient:  I have spent a total of 35 minutes with the patient reviewing hospital notes, telemetry, EKGs, labs and examining the patient as well as establishing an assessment and plan that was discussed personally with the patient.  > 50% of time was spent in direct patient care.  Length of Stay:  LOS: 4 days   Chrystie Nose, MD, [facility], FACP  [place]  [facility]  Medical Director of the [facility]  Cardiovascular Risk Reduction Clinic Diplomate of the American Board of Clinical Lipidology Attending Cardiologist  Direct Dial: [phone]  Fax: [phone]  Website:  [email or web address] Nicely Tyaisha Cullom 05/13/2023, 10:49 AM

## 2023-05-13 NOTE — Plan of Care (Signed)

## 2023-05-14 ENCOUNTER — Other Ambulatory Visit: Payer: Self-pay

## 2023-05-14 DIAGNOSIS — I4891 Unspecified atrial fibrillation: Secondary | ICD-10-CM | POA: Diagnosis not present

## 2023-05-14 DIAGNOSIS — K56609 Unspecified intestinal obstruction, unspecified as to partial versus complete obstruction: Secondary | ICD-10-CM | POA: Diagnosis not present

## 2023-05-14 DIAGNOSIS — I5031 Acute diastolic (congestive) heart failure: Secondary | ICD-10-CM | POA: Diagnosis not present

## 2023-05-14 DIAGNOSIS — J9601 Acute respiratory failure with hypoxia: Secondary | ICD-10-CM | POA: Diagnosis not present

## 2023-05-14 DIAGNOSIS — F339 Major depressive disorder, recurrent, unspecified: Secondary | ICD-10-CM

## 2023-05-14 LAB — GLUCOSE, CAPILLARY
Glucose-Capillary: 102 mg/dL — ABNORMAL HIGH (ref 70–99)
Glucose-Capillary: 126 mg/dL — ABNORMAL HIGH (ref 70–99)
Glucose-Capillary: 128 mg/dL — ABNORMAL HIGH (ref 70–99)
Glucose-Capillary: 94 mg/dL (ref 70–99)
Glucose-Capillary: 115 mg/dL — ABNORMAL HIGH (ref 70–99)

## 2023-05-14 LAB — CBC WITH DIFFERENTIAL/PLATELET
Abs Immature Granulocytes: 0.86 10*3/uL — ABNORMAL HIGH (ref 0.00–0.07)
Basophils Absolute: 0 10*3/uL (ref 0.0–0.1)
Basophils Relative: 0 %
Eosinophils Absolute: 0 10*3/uL (ref 0.0–0.5)
Eosinophils Relative: 0 %
HCT: 30.1 % — ABNORMAL LOW (ref 36.0–46.0)
Hemoglobin: 10.3 g/dL — ABNORMAL LOW (ref 12.0–15.0)
Immature Granulocytes: 7 %
Lymphocytes Relative: 17 %
Lymphs Abs: 2.2 10*3/uL (ref 0.7–4.0)
MCH: 32.6 pg (ref 26.0–34.0)
MCHC: 34.2 g/dL (ref 30.0–36.0)
MCV: 95.3 fL (ref 80.0–100.0)
Monocytes Absolute: 1.7 10*3/uL — ABNORMAL HIGH (ref 0.1–1.0)
Monocytes Relative: 13 %
Neutro Abs: 8.1 10*3/uL — ABNORMAL HIGH (ref 1.7–7.7)
Neutrophils Relative %: 63 %
Platelets: 127 10*3/uL — ABNORMAL LOW (ref 150–400)
RBC: 3.16 MIL/uL — ABNORMAL LOW (ref 3.87–5.11)
RDW: 13.5 % (ref 11.5–15.5)
WBC: 12.9 10*3/uL — ABNORMAL HIGH (ref 4.0–10.5)
nRBC: 0 % (ref 0.0–0.2)

## 2023-05-14 LAB — MAGNESIUM: Magnesium: 2.1 mg/dL (ref 1.7–2.4)

## 2023-05-14 LAB — RENAL FUNCTION PANEL
Albumin: 2.2 g/dL — ABNORMAL LOW (ref 3.5–5.0)
Anion gap: 15 (ref 5–15)
BUN: 14 mg/dL (ref 8–23)
CO2: 23 mmol/L (ref 22–32)
Calcium: 7.7 mg/dL — ABNORMAL LOW (ref 8.9–10.3)
Chloride: 98 mmol/L (ref 98–111)
Creatinine, Ser: 0.71 mg/dL (ref 0.44–1.00)
GFR, Estimated: >60 mL/min (ref >60)
Glucose, Bld: 107 mg/dL — ABNORMAL HIGH (ref 70–99)
Phosphorus: 1.2 mg/dL — ABNORMAL LOW (ref 2.5–4.6)
Potassium: 3.1 mmol/L — ABNORMAL LOW (ref 3.5–5.1)
Sodium: 136 mmol/L (ref 135–145)

## 2023-05-14 LAB — BRAIN NATRIURETIC PEPTIDE: B Natriuretic Peptide: 130.2 pg/mL — ABNORMAL HIGH (ref 0.0–100.0)

## 2023-05-14 LAB — BASIC METABOLIC PANEL
Anion gap: 12 (ref 5–15)
BUN: 14 mg/dL (ref 8–23)
CO2: 23 mmol/L (ref 22–32)
Calcium: 7.3 mg/dL — ABNORMAL LOW (ref 8.9–10.3)
Chloride: 101 mmol/L (ref 98–111)
Creatinine, Ser: 0.7 mg/dL (ref 0.44–1.00)
GFR, Estimated: >60 mL/min (ref >60)
Glucose, Bld: 110 mg/dL — ABNORMAL HIGH (ref 70–99)
Potassium: 3 mmol/L — ABNORMAL LOW (ref 3.5–5.1)
Sodium: 136 mmol/L (ref 135–145)

## 2023-05-14 LAB — HEPARIN LEVEL (UNFRACTIONATED): Heparin Unfractionated: 0.17 [IU]/mL — ABNORMAL LOW (ref 0.30–0.70)

## 2023-05-14 MED ORDER — ALBUMIN HUMAN 25 % IV SOLN
25.0000 g | Freq: Four times a day (QID) | INTRAVENOUS | Status: AC
  Administered 2023-05-14: 25 g via INTRAVENOUS
  Administered 2023-05-14 (×2): 12.5 g via INTRAVENOUS
  Administered 2023-05-15: 25 g via INTRAVENOUS
  Filled 2023-05-14 (×4): qty 100

## 2023-05-14 MED ORDER — DOCUSATE SODIUM 100 MG PO CAPS
100.0000 mg | ORAL_CAPSULE | Freq: Two times a day (BID) | ORAL | Status: DC
  Administered 2023-05-14 – 2023-05-18 (×9): 100 mg via ORAL
  Filled 2023-05-14 (×9): qty 1

## 2023-05-14 MED ORDER — ENSURE ENLIVE PO LIQD
237.0000 mL | Freq: Two times a day (BID) | ORAL | Status: DC
  Administered 2023-05-14 – 2023-05-16 (×3): 237 mL via ORAL

## 2023-05-14 MED ORDER — HEPARIN BOLUS VIA INFUSION
1000.0000 [IU] | Freq: Once | INTRAVENOUS | Status: AC
  Administered 2023-05-14: 1000 [IU] via INTRAVENOUS
  Filled 2023-05-14: qty 1000

## 2023-05-14 MED ORDER — POTASSIUM CHLORIDE 10 MEQ/100ML IV SOLN
10.0000 meq | INTRAVENOUS | Status: AC
Start: 2023-05-14 — End: 2023-05-15
  Administered 2023-05-14 (×5): 10 meq via INTRAVENOUS
  Filled 2023-05-14 (×4): qty 100

## 2023-05-14 MED ORDER — APIXABAN 2.5 MG PO TABS
2.5000 mg | ORAL_TABLET | Freq: Two times a day (BID) | ORAL | Status: DC
  Administered 2023-05-14 – 2023-05-18 (×9): 2.5 mg via ORAL
  Filled 2023-05-14 (×9): qty 1

## 2023-05-14 MED ORDER — AMIODARONE HCL 200 MG PO TABS
200.0000 mg | ORAL_TABLET | Freq: Two times a day (BID) | ORAL | Status: DC
  Administered 2023-05-14 – 2023-05-18 (×9): 200 mg via ORAL
  Filled 2023-05-14 (×9): qty 1

## 2023-05-14 MED ORDER — POLYETHYLENE GLYCOL 3350 17 G PO PACK
17.0000 g | PACK | Freq: Every day | ORAL | Status: DC
  Administered 2023-05-14 – 2023-05-18 (×5): 17 g via ORAL
  Filled 2023-05-14 (×5): qty 1

## 2023-05-14 MED ORDER — POTASSIUM PHOSPHATES 15 MMOLE/5ML IV SOLN
30.0000 mmol | Freq: Once | INTRAVENOUS | Status: AC
  Administered 2023-05-14: 30 mmol via INTRAVENOUS
  Filled 2023-05-14: qty 10

## 2023-05-14 NOTE — Progress Notes (Signed)
 Subjective/Chief Complaint: Sitting up in bed. Still with some abdominal fullness but no pain or nausea with clears. Passing flatus and had at least 2 Bms - one without suppository   Objective: Vital signs in last 24 hours: Temp:  [97.8 F (36.6 C)-98.2 F (36.8 C)] 98 F (36.7 C) (03/02 0451) Pulse Rate:  [80-104] 86 (03/02 0451) Resp:  [14-29] 18 (03/02 0451) BP: (72-122)/(48-81) 91/48 (03/02 0451) SpO2:  [88 %-100 %] 94 % (03/02 0451) Weight:  [51.7 kg] 51.7 kg (03/02 0455) Last BM Date : 05/13/23  Intake/Output from previous day: 03/01 0701 - 03/02 0700 In: 807.9 [I.V.:611.8; IV Piggyback:196.1] Out: 1200 [Urine:1200] Intake/Output this shift: No intake/output data recorded.   PE: General: pleasant, WD, female who is laying in bed in NAD Lungs: Respiratory effort nonlabored on [place] Abd: soft, mild to moderate distension. No TTP Skin: warm and dry  Psych: A&Ox3 with an appropriate affect.    Lab Results:  Recent Labs    05/13/23 0250 05/14/23 0320  WBC 11.6* 12.9*  HGB 11.5* 10.3*  HCT 33.4* 30.1*  PLT 120* 127*   BMET Recent Labs    05/13/23 0250 05/14/23 0320  NA 134* 136  136  K 3.0* 3.0*  3.1*  CL 96* 101  98  CO2 21* 23  23  GLUCOSE 240* 110*  107*  BUN 10 14  14   CREATININE 0.75 0.70  0.71  CALCIUM 7.3* 7.3*  7.7*   PT/INR No results for input(s): "LABPROT", "INR" in the last 72 hours. ABG No results for input(s): "PHART", "HCO3" in the last 72 hours.  Invalid input(s): "PCO2", "PO2"  Studies/Results: VAS [place] UPPER EXTREMITY VENOUS DUPLEX Result Date: 05/13/2023 UPPER VENOUS STUDY  Patient Name:  Erica [name]  Date of Exam:   05/13/2023 Medical Rec #: [id number]        Accession #:    [id number] Date of Birth: [birth year]        Patient Gender: F Patient Age:   89 years Exam Location:  [facility] Procedure:      VAS [place] UPPER EXTREMITY VENOUS DUPLEX Referring Phys: Dolly Rias  --------------------------------------------------------------------------------  Indications: Swelling, and Erythema Limitations: Poor ultrasound/tissue interface and patient unable to keep arm rotated. Comparison Study: No prior study on file Performing Technologist: Sherren Kerns RVS  Examination Guidelines: A complete evaluation includes B-mode imaging, spectral Doppler, color Doppler, and power Doppler as needed of all accessible portions of each vessel. Bilateral testing is considered an integral part of a complete examination. Limited examinations for reoccurring indications may be performed as noted.  Right Findings: +----------+------------+---------+-----------+----------+---------------------+ RIGHT     CompressiblePhasicitySpontaneousProperties       Summary        +----------+------------+---------+-----------+----------+---------------------+ IJV           Full       Yes       Yes                                    +----------+------------+---------+-----------+----------+---------------------+ Subclavian    Full       Yes       Yes                                    +----------+------------+---------+-----------+----------+---------------------+ Axillary      Full       Yes  Yes                                    +----------+------------+---------+-----------+----------+---------------------+ Brachial                 Yes       Yes               patent by color and                                                             Doppler        +----------+------------+---------+-----------+----------+---------------------+ Radial                                                 patent by color    +----------+------------+---------+-----------+----------+---------------------+ Ulnar                                                  Not visualized     +----------+------------+---------+-----------+----------+---------------------+ Cephalic                  Yes       Yes               patent by color and                                                             Doppler        +----------+------------+---------+-----------+----------+---------------------+ Basilic       None                                          Acute         +----------+------------+---------+-----------+----------+---------------------+  Left Findings: +----------+------------+---------+-----------+----------+-------+ LEFT      CompressiblePhasicitySpontaneousPropertiesSummary +----------+------------+---------+-----------+----------+-------+ Subclavian               Yes       Yes                      +----------+------------+---------+-----------+----------+-------+  Summary:  Right: No evidence of deep vein thrombosis in the upper extremity. Findings consistent with acute superficial vein thrombosis involving the right basilic vein.  Left: No evidence of superficial vein thrombosis in the upper extremity.  *See table(s) above for measurements and observations.     Preliminary    DG CHEST PORT 1 VIEW Result Date: 05/12/2023 CLINICAL DATA:  Shortness of breath. EXAM: PORTABLE CHEST 1 VIEW COMPARISON:  One-view chest x-ray 05/09/2023 FINDINGS: The patient has been extubated.  Gastric tube was removed. The heart size is normal. Atherosclerotic changes are present at the aortic arch progressive interstitial and airspace disease is  present bilaterally. Progressive bilateral effusions are present. IMPRESSION: 1. Progressive interstitial and airspace disease bilaterally consistent with progressive bilateral pneumonia and edema. 2. Progressive bilateral effusions. Electronically Signed   By: Marin Roberts M.D.   On: 05/12/2023 14:17   DG Abd Portable 1V Result Date: 05/12/2023 CLINICAL DATA:  Follow up small bowel obstruction. EXAM: PORTABLE ABDOMEN - 1 VIEW COMPARISON:  Radiographs 05/11/2023 and 05/10/2023.  CT 05/08/2023. FINDINGS: 0830 hours. 2  portable supine views of the abdomen are submitted. Previously administered enteric contrast remains within nondilated colon. No significant retained contrast within the small bowel. There are few mildly dilated loops of small bowel centrally. No evidence of contrast extravasation. Multiple surgical clips are noted. Enteric tube has been removed. IMPRESSION: Previously administered enteric contrast remains within nondilated colon. No significant retained contrast within the small bowel. Electronically Signed   By: Carey Bullocks M.D.   On: 05/12/2023 12:30   ECHOCARDIOGRAM COMPLETE Result Date: 05/12/2023    ECHOCARDIOGRAM REPORT   Patient Name:   Erica [name] Date of Exam: 05/12/2023 Medical Rec #:  [id number]       Height:       60.0 in Accession #:    [id number]      Weight:       135.6 lb Date of Birth:  09-07-1932       BSA:          1.582 m Patient Age:    89 years        BP:           158/93 mmHg Patient Gender: F               HR:           106 bpm. Exam Location:  Inpatient Procedure: 2D Echo, Cardiac Doppler and Color Doppler (Both Spectral and Color            Flow Doppler were utilized during procedure). Indications:    I48.91* Unspecified atrial fibrillation  History:        Patient has prior history of Echocardiogram examinations, most                 recent 05/01/2017. Arrythmias:Atrial Fibrillation; Risk                 Factors:Hypertension and Dyslipidemia.  Sonographer:    Irving Burton Senior RDCS Referring Phys: [id number] DANIEL V THOMPSON IMPRESSIONS  1. Left ventricular ejection fraction, by estimation, is 70 to 75%. Left ventricular ejection fraction by PLAX is 75 %. The left ventricle has hyperdynamic function. The left ventricle has no regional wall motion abnormalities. Left ventricular diastolic parameters are consistent with Grade I diastolic dysfunction (impaired relaxation).  2. Right ventricular systolic function is normal. The right ventricular size is normal. There is normal pulmonary artery  systolic pressure. The estimated right ventricular systolic pressure is 29.8 mmHg.  3. The mitral valve is normal in structure. Mild mitral valve regurgitation. Moderate mitral annular calcification.  4. The aortic valve is tricuspid. Aortic valve regurgitation is not visualized. Aortic valve sclerosis is present, with no evidence of aortic valve stenosis.  5. The inferior vena cava is normal in size with greater than 50% respiratory variability, suggesting right atrial pressure of 3 mmHg. FINDINGS  Left Ventricle: Left ventricular ejection fraction, by estimation, is 70 to 75%. Left ventricular ejection fraction by PLAX is 75 %. The left ventricle has hyperdynamic function. The left ventricle has no regional wall motion abnormalities. The left  ventricular internal cavity size was normal in size. There is no left ventricular hypertrophy. Left ventricular diastolic parameters are consistent with Grade I diastolic dysfunction (impaired relaxation). Right Ventricle: The right ventricular size is normal. No increase in right ventricular wall thickness. Right ventricular systolic function is normal. There is normal pulmonary artery systolic pressure. The tricuspid regurgitant velocity is 2.59 m/s, and  with an assumed right atrial pressure of 3 mmHg, the estimated right ventricular systolic pressure is 29.8 mmHg. Left Atrium: Left atrial size was normal in size. Right Atrium: Right atrial size was normal in size. Pericardium: Trivial pericardial effusion is present. Mitral Valve: The mitral valve is normal in structure. Moderate mitral annular calcification. Mild mitral valve regurgitation. Tricuspid Valve: The tricuspid valve is normal in structure. Tricuspid valve regurgitation is trivial. Aortic Valve: The aortic valve is tricuspid. Aortic valve regurgitation is not visualized. Aortic valve sclerosis is present, with no evidence of aortic valve stenosis. Pulmonic Valve: The pulmonic valve was normal in structure.  Pulmonic valve regurgitation is trivial. Aorta: The aortic root and ascending aorta are structurally normal, with no evidence of dilitation. Venous: The inferior vena cava is normal in size with greater than 50% respiratory variability, suggesting right atrial pressure of 3 mmHg. IAS/Shunts: No atrial level shunt detected by color flow Doppler.  LEFT VENTRICLE PLAX 2D LV EF:         Left            Diastology                ventricular     LV e' medial:    9.14 cm/s                ejection        LV E/e' medial:  8.4                fraction by     LV e' lateral:   9.79 cm/s                PLAX is 75      LV E/e' lateral: 7.8                %. LVIDd:         3.30 cm LVIDs:         1.90 cm LV PW:         0.80 cm LV IVS:        1.00 cm LVOT diam:     1.90 cm LV SV:         45 LV SV Index:   29 LVOT Area:     2.84 cm  RIGHT VENTRICLE RV S prime:     15.10 cm/s TAPSE (M-mode): 1.9 cm LEFT ATRIUM             Index        RIGHT ATRIUM          Index LA diam:        2.80 cm 1.77 cm/m   RA Area:     8.27 cm LA Vol (A2C):   22.2 ml 14.03 ml/m  RA Volume:   13.60 ml 8.59 ml/m LA Vol (A4C):   36.3 ml 22.94 ml/m LA Biplane Vol: 30.3 ml 19.15 ml/m  AORTIC VALVE LVOT Vmax:   94.20 cm/s LVOT Vmean:  61.700 cm/s LVOT VTI:    0.160 m  AORTA Ao Root diam: 2.70 cm Ao Asc diam:  2.60 cm MITRAL VALVE  TRICUSPID VALVE MV Area (PHT): 2.49 cm     TR Peak grad:   26.8 mmHg MV Decel Time: 305 msec     TR Vmax:        259.00 cm/s MV E velocity: 76.50 cm/s MV A velocity: 106.00 cm/s  SHUNTS MV E/A ratio:  0.72         Systemic VTI:  0.16 m                             Systemic Diam: 1.90 cm Clearnce Hasten Electronically signed by Clearnce Hasten Signature Date/Time: 05/12/2023/10:04:37 AM    Final     Anti-infectives: Anti-infectives (From admission, onward)    Start     Dose/Rate Route Frequency Ordered Stop   05/14/23 0800  cefTRIAXone (ROCEPHIN) 2 g in sodium chloride 0.9 % 100 mL IVPB        2 g 200 mL/hr over  30 Minutes Intravenous Every 24 hours 05/13/23 1005 05/16/23 0759   05/13/23 2200  metroNIDAZOLE (FLAGYL) IVPB 500 mg        500 mg 100 mL/hr over 60 Minutes Intravenous Every 12 hours 05/13/23 1005 05/15/23 2159   05/09/23 1000  cefTRIAXone (ROCEPHIN) 2 g in sodium chloride 0.9 % 100 mL IVPB        2 g 200 mL/hr over 30 Minutes Intravenous Every 24 hours 05/09/23 0906 05/13/23 0923   05/09/23 1000  metroNIDAZOLE (FLAGYL) IVPB 500 mg  Status:  Discontinued        500 mg 100 mL/hr over 60 Minutes Intravenous Every 12 hours 05/09/23 0906 05/13/23 1005   05/09/23 0430  levofloxacin (LEVAQUIN) IVPB 750 mg        750 mg 100 mL/hr over 90 Minutes Intravenous  Once 05/09/23 0424 05/09/23 0602       Assessment/Plan: SBO - partial - CT w/ Distended fluid-filled stomach and small bowel into the pelvis. Distal small bowel decompressed. Findings compatible with distal small bowel obstruction. Large hiatal hernia, fluid-filled - No current indication for emergency surgery - aspiration with initial NGT placement - intubated and now s/p extubation - afebrile. WBC overall improved from admission 11.6>>12.9 - contrast in colon on xray - NGT out . Tolerating clears. Advance to FLD - Continue daily Miralax - Keep K > 4 and Mg > 2 for bowel function, k 3.0 - getting PO repletion - Mobilize for bowel function     FEN: CLD. K repletion per primary, IVF per primary ID: rocephin/flagyl (aspiration) VTE: hep gtt   Per primary: Acute onset afib RVR - on amio drip HLD IBS RA BPD H/o abdominal hysterectomy, h/o colostomy s/p reversal    LOS: 5 days   Eric Form, [facility] 05/14/2023, 7:31 AM Please see Amion for pager number during day hours 7:00am-4:30pm

## 2023-05-14 NOTE — Discharge Instructions (Signed)

## 2023-05-14 NOTE — Progress Notes (Signed)
 PHARMACY - ANTICOAGULATION CONSULT NOTE  Pharmacy Consult for heparin Indication: atrial fibrillation  Labs: Recent Labs    05/12/23 0438 05/12/23 1151 05/12/23 1151 05/13/23 0250 05/13/23 1010 05/14/23 0320  HGB  --  13.6   < > 11.5*  --  10.3*  HCT  --  41.0  --  33.4*  --  30.1*  PLT  --  107*  --  120*  --  127*  HEPARINUNFRC  --   --   --  0.38 0.32 0.17*  CREATININE 0.78  --   --  0.75  --  0.70  0.71   < > = values in this interval not displayed.   Assessment: 89yo female subtherapeutic on heparin with lower heparin level despite increased rate; no infusion issues or signs of bleeding per RN.  Goal of Therapy:  Heparin level 0.3-0.7 units/ml   Plan:  1000 units heparin bolus. Increase heparin infusion by 3 units/kg/hr to 1100 units/hr. Check level in 8 hours.   Vernard Gambles, PharmD, BCPS 05/14/2023 4:39 AM

## 2023-05-14 NOTE — Progress Notes (Signed)
 PROGRESS NOTE    Erica [name]  XBM:[id number] DOB: 1932/10/18 DOA: 05/08/2023 PCP: Aliene Beams, MD   Chief Complaint  Patient presents with   Nausea   Diarrhea    Brief Narrative:  89 year old woman who presented with 48h of nausea, vomiting and diarrhea.  Found to have SBO but difficulty passing NGT led to aspiration event. Decision made to intubate to facilitate placement as detailed in Dr Acey Lav note.  Patient intubated admitted to PCCM team placed on IV antibiotics for possible aspiration pneumonia.  Patient subsequently extubated and transferred to hospitalist team.  General surgery consulted and following.   Assessment & Plan:   Principal Problem:   Acute respiratory failure with hypoxia (HCC) Active Problems:   GERD (gastroesophageal reflux disease)   Depression   Small bowel obstruction (HCC)   Hypothyroidism   Major depressive disorder, recurrent episode with anxious distress (HCC)   Rheumatoid aortitis   Bipolar disorder with severe depression (HCC)   Hypokalemia   Obstruction of bowel (HCC)   Atrial fib/flutter, transient (HCC)   SBO (small bowel obstruction) (HCC)   Pressure injury of skin   Aspiration pneumonia (HCC)   Acute diastolic CHF (congestive heart failure) (HCC)   Hypophosphatemia  #1 acute hypoxic and hypercarbic respiratory failure -Multifactorial, felt likely secondary to aspiration pneumonia in the setting of NG tube placement, acute diastolic CHF exacerbation, atrial fibrillation. -Patient noted to be intubated and subsequently extubated. -Patient with complaints of worsening shortness of breath on 5 L high flow nasal cannula on 05/12/2023 and 05/13/2023. -Patient with clinical improvement today.. -Continue empiric IV antibiotics of Rocephin and Flagyl to complete a 7-day course. -Due to patient's complaints of worsening shortness of breath chest x-ray was ordered consistent with volume overload and pneumonia, as well as BNP obtained which  was 344.5. -Chest x-ray with progressive interstitial and airspace disease bilaterally consistent with progressive bilateral pneumonia and edema.  Progressive bilateral effusions. -Patient was on Lasix 20 mg IV every 12 hours which was increased to 40 mg IV every 12 hours by cardiology, Lasix on hold today.   -Supportive care.   2.  Aspiration pneumonia -Patient admitted had to be intubated with NG tube placement with concerns for aspiration pneumonia. -Continue IV Rocephin and IV Flagyl to complete a 7-day course of treatment.  3.  Acute diastolic CHF exacerbation -Patient with complaints of worsening shortness of breath on 05/12/2023 as well as today 05/13/2023 on 5 L high flow nasal cannula. -Likely secondary to A-fib. -Chest x-ray done concerning for volume overload, progressive bilateral pneumonia and edema, progressive bilateral effusions. -Patient noted to have gone into transient A-fib with RVR and currently on amiodarone drip. -BNP done elevated.  -Patient noted to be +7.916 L during this hospitalization. -2D echo done with a EF of 70 to 75%,NWMA, grade 1 DD. -Saline lock IV fluids. -Patient currently on Lasix 40 mg IV every 12 hours with urine output of 1.2 L over the past 24 hours.   -IV Lasix held per cardiology today.   -IV albumin every 6 hours x 24 hours.   -Cardiology following and appreciate input and recommendations.    4.  A-fib CHA2DS2VASC score 4 -Patient noted to go into A-fib intermittently and going into A-fib back-and-forth per telemetry, likely secondary to small bowel obstruction, aspiration pneumonia. -Patient placed on amiodarone drip and currently on scheduled IV metoprolol. -2D echo obtained with a EF of 70 to 75%, NWMA, grade 1 DD. -Patient noted to be in normal  sinus rhythm this morning.  -Patient being followed by cardiology and IV amiodarone has been transition to oral amiodarone pills 200 mg twice daily x 7 days then 200 mg daily. -Patient to receive IV  albumin today. -Lasix being held per cardiology today. -IV heparin being transition to oral Eliquis 2.5 mg twice daily today per cardiology recommendations. -Cardiology following and appreciate input and recommendations.  5.  SBO -Patient noted to have NG tube placed however NG tube noted to have been dislodged overnight. -Patient with bowel movement overnight per daughter.  -Patient states some improvement with abdominal pain. -CT abdomen and pelvis done on admission with distended fluid-filled stomach and small bowel into the pelvis.  Distal small bowel decompressed.  Findings compatible with distal small bowel obstruction.  Large hiatal hernia, fluid-filled. -Abdominal films ordered this morning with previously administered enteric contrast remains within nondilated colon.  No significant retained contrast within the small bowel. -NG tube noted to have been pulled out per patient on 05/11/2023. -Patient being followed by general surgery and being managed by general surgery.   -Patient on MiraLAX daily as well as received a Dulcolax suppository yesterday per cardiology with 2-3 liquid bowel movements yesterday. -Tolerating clear liquids and diet advanced to full liquid diet.  -Keep potassium approximately 4, magnesium approximately 2. -Per general surgery.  6.  Hypokalemia/hypomagnesemia/hypophosphatemia -Patient on IV Lasix.   -Potassium at 3.0 today, replete with IV potassium.   -Phosphorus at 1.2 this morning. -K-Phos 30 mmol IV x 1. -Magnesium repleted currently at 2.1 today.   -Repeat labs in the AM.   7.  Thrombocytopenia -Improving. -Patient with no overt bleeding.  8.  History of rheumatoid arthritis -Outpatient follow-up.  9.  Hypothyroidism -Continue home regimen Synthroid.  10. Bipolar disorder/depression -Continue home regimen Effexor and Rexulti..   11.  GERD -Continue IV PPI.   12.  Pressure injury, POA Pressure Injury 05/09/23 Coccyx Stage 1 -  Intact skin  with non-blanchable redness of a localized area usually over a bony prominence. (Active)  05/09/23 0930  Location: Coccyx  Location Orientation:   Staging: Stage 1 -  Intact skin with non-blanchable redness of a localized area usually over a bony prominence.  Wound Description (Comments):   Present on Admission: Yes         DVT prophylaxis: Heparin drip>>> Eliquis Code Status: DNR with interventions Family Communication: Updated patient and daughter and son-in-law at bedside. Disposition: TBD  Status is: Inpatient Remains inpatient appropriate because: Severity of illness   Consultants:  Admitted to PCCM General Surgery: Dr.Tsuei 05/09/2023 Cardiology: Dr. Antoine Poche 05/12/2023  Procedures:  CT abdomen and pelvis 05/08/2023 Chest x-ray 05/11/2022 Abdominal films 05/09/2023, 05/11/2023, 05/12/2023. 2D echo 05/12/2023  Significant Hospital Events: Including procedures, antibiotic start and stop dates in addition to other pertinent events   2/25 admitted to ICU, was intubated for aspiration during NGT placement. Extubated in evening  2/26 improved pressures, going to try a small dose of metop for rate     Antimicrobials:  Anti-infectives (From admission, onward)    Start     Dose/Rate Route Frequency Ordered Stop   05/14/23 0800  cefTRIAXone (ROCEPHIN) 2 g in sodium chloride 0.9 % 100 mL IVPB        2 g 200 mL/hr over 30 Minutes Intravenous Every 24 hours 05/13/23 1005 05/16/23 0759   05/13/23 2200  metroNIDAZOLE (FLAGYL) IVPB 500 mg        500 mg 100 mL/hr over 60 Minutes Intravenous Every 12 hours 05/13/23  1005 05/15/23 2159   05/09/23 1000  cefTRIAXone (ROCEPHIN) 2 g in sodium chloride 0.9 % 100 mL IVPB        2 g 200 mL/hr over 30 Minutes Intravenous Every 24 hours 05/09/23 0906 05/13/23 0923   05/09/23 1000  metroNIDAZOLE (FLAGYL) IVPB 500 mg  Status:  Discontinued        500 mg 100 mL/hr over 60 Minutes Intravenous Every 12 hours 05/09/23 0906 05/13/23 1005   05/09/23  0430  levofloxacin (LEVAQUIN) IVPB 750 mg        750 mg 100 mL/hr over 90 Minutes Intravenous  Once 05/09/23 0424 05/09/23 0602         Subjective: Patient sitting up in bed states shortness of breath is improved past 24 hours.  Denies any chest pain extremity swelling has improved.  Patient states abdominal pain improved.  Patient with stools yesterday.  Currently normal sinus rhythm.  Tolerating current diet.  Daughter and son-in-law at bedside.  More alert today.  Objective: Vitals:   05/13/23 2130 05/13/23 2250 05/14/23 0451 05/14/23 0455  BP: (!) 84/51 (!) 90/54 (!) 91/48   Pulse: 82 82 86   Resp: 16 18 18    Temp:  98.2 F (36.8 C) 98 F (36.7 C)   TempSrc:  Oral Oral   SpO2: 99% 99% 94%   Weight:    51.7 kg  Height:        Intake/Output Summary (Last 24 hours) at 05/14/2023 1030 Last data filed at 05/14/2023 0830 Gross per 24 hour  Intake 889.71 ml  Output 900 ml  Net -10.29 ml   Filed Weights   05/11/23 0500 05/13/23 0500 05/14/23 0455  Weight: 61.5 kg 64.8 kg 51.7 kg    Examination:  General exam: NAD. Respiratory system: Some decreased breath sounds in the bases.  No wheezing.  Fair air movement.  On 3.5 L high flow nasal cannula. Cardiovascular system: Regular rate rhythm no murmurs rubs or gallops.  Trace to 1+ bilateral lower extremity edema.  Gastrointestinal system: Abdomen is mildly distended, soft, decreased tenderness to palpation diffusely, positive bowel sounds.  No rebound.  No guarding.  Central nervous system: Alert and oriented.  Moving extremities spontaneously.  No focal neurological deficits. Extremities: Right upper extremity swelling improved.  Trace to 1+ bilateral lower extremity edema. Skin: No rashes, lesions or ulcers Psychiatry: Judgement and insight appear fair. Mood & affect appropriate.     Data Reviewed: I have personally reviewed following labs and imaging studies  CBC: Recent Labs  Lab 05/09/23 0440 05/09/23 0747  05/09/23 0805 05/09/23 1704 05/10/23 0528 05/12/23 1151 05/13/23 0250 05/14/23 0320  WBC 6.7 4.8  --   --  14.1* 15.0* 11.6* 12.9*  NEUTROABS 4.5 3.0  --   --   --   --  8.5* 8.1*  HGB 13.0 14.7   < > 11.6* 13.4 13.6 11.5* 10.3*  HCT 39.2 45.7   < > 34.0* 40.4 41.0 33.4* 30.1*  MCV 97.5 99.8  --   --  99.5 96.0 94.4 95.3  PLT 155 128*  --   --  98* 107* 120* 127*   < > = values in this interval not displayed.    Basic Metabolic Panel: Recent Labs  Lab 05/09/23 0747 05/09/23 0805 05/10/23 0528 05/11/23 0338 05/11/23 1708 05/12/23 0438 05/13/23 0250 05/14/23 0320  NA 136   < > 136 140 139 139 134* 136  136  K 3.2*   < > 4.2 3.0* 4.0  3.4* 3.0* 3.0*  3.1*  CL 108  --  107 106 107 105 96* 101  98  CO2 18*  --  17* 22 20* 19* 21* 23  23  GLUCOSE 134*  --  94 109* 97 99 240* 110*  107*  BUN 25*  --  18 9 9 10 10 14  14   CREATININE 1.06*  --  1.12* 0.76 0.67 0.78 0.75 0.70  0.71  CALCIUM 8.0*  --  7.9* 8.0* 7.9* 7.6* 7.3* 7.3*  7.7*  MG 1.7  --  2.2  --   --  2.0 1.5* 2.1  PHOS 3.9  --  2.7  --   --   --   --  1.2*   < > = values in this interval not displayed.    GFR: Estimated Creatinine Clearance: 34.9 mL/min (by C-G formula based on SCr of 0.71 mg/dL).  Liver Function Tests: Recent Labs  Lab 05/08/23 2120 05/09/23 0747 05/13/23 0250 05/14/23 0320  AST 17 26  --   --   ALT 13 14  --   --   ALKPHOS 67 50  --   --   BILITOT 0.4 0.7  --   --   PROT 8.1 7.0  --   --   ALBUMIN 4.6 3.3* 2.2* 2.2*    CBG: Recent Labs  Lab 05/13/23 1623 05/13/23 1949 05/13/23 2331 05/14/23 0449 05/14/23 0832  GLUCAP 122* 117* 128* 102* 126*     Recent Results (from the past 240 hours)  MRSA Next Gen by PCR, Nasal     Status: None   Collection Time: 05/09/23  8:52 AM   Specimen: Nasal Mucosa; Nasal Swab  Result Value Ref Range Status   MRSA by PCR Next Gen NOT DETECTED NOT DETECTED Final    Comment: (NOTE) The GeneXpert MRSA Assay (FDA approved for NASAL  specimens only), is one component of a comprehensive MRSA colonization surveillance program. It is not intended to diagnose MRSA infection nor to guide or monitor treatment for MRSA infections. Test performance is not FDA approved in patients less than 1 years old. Performed at [facility], [address] [place], [place] [address]   Resp panel by RT-PCR (RSV, Flu A&B, Covid) Anterior Nasal Swab     Status: None   Collection Time: 05/13/23 12:58 AM   Specimen: Anterior Nasal Swab  Result Value Ref Range Status   SARS Coronavirus 2 by RT PCR NEGATIVE NEGATIVE Final   Influenza A by PCR NEGATIVE NEGATIVE Final   Influenza B by PCR NEGATIVE NEGATIVE Final    Comment: (NOTE) The Xpert Xpress SARS-CoV-2/FLU/RSV plus assay is intended as an aid in the diagnosis of influenza from Nasopharyngeal swab specimens and should not be used as a sole basis for treatment. Nasal washings and aspirates are unacceptable for Xpert Xpress SARS-CoV-2/FLU/RSV testing.  Fact Sheet for Patients: BloggerCourse.com  Fact Sheet for Healthcare Providers: SeriousBroker.it  This test is not yet approved or cleared by the [place] FDA and has been authorized for detection and/or diagnosis of SARS-CoV-2 by FDA under an Emergency Use Authorization (EUA). This EUA will remain in effect (meaning this test can be used) for the duration of the COVID-19 declaration under Section 564(b)(1) of the Act, 21 U.S.C. section 360bbb-3(b)(1), unless the authorization is terminated or revoked.     Resp Syncytial Virus by PCR NEGATIVE NEGATIVE Final    Comment: (NOTE) Fact Sheet for Patients: BloggerCourse.com  Fact Sheet for Healthcare Providers:  SeriousBroker.it  This test is not yet approved or cleared by the [place] and has been authorized for detection and/or diagnosis of SARS-CoV-2 by FDA under  an Emergency Use Authorization (EUA). This EUA will remain in effect (meaning this test can be used) for the duration of the COVID-19 declaration under Section 564(b)(1) of the Act, 21 U.S.C. section 360bbb-3(b)(1), unless the authorization is terminated or revoked.  Performed at [facility], [address] [place], [place] [address]   Culture, blood (Routine X 2) w Reflex to ID Panel     Status: None (Preliminary result)   Collection Time: 05/13/23  2:50 AM   Specimen: BLOOD  Result Value Ref Range Status   Specimen Description BLOOD BLOOD LEFT ARM  Final   Special Requests   Final    BOTTLES DRAWN AEROBIC AND ANAEROBIC Blood Culture adequate volume   Culture   Final    NO GROWTH 1 DAY Performed at [facility], [address], [place], [place] [address]    Report Status PENDING  Incomplete  Culture, blood (Routine X 2) w Reflex to ID Panel     Status: None (Preliminary result)   Collection Time: 05/13/23  2:50 AM   Specimen: BLOOD  Result Value Ref Range Status   Specimen Description BLOOD BLOOD RIGHT HAND  Final   Special Requests AEROBIC BOTTLE ONLY Blood Culture adequate volume  Final   Culture   Final    NO GROWTH 1 DAY Performed at [facility], [address], [place], [place] [address]    Report Status PENDING  Incomplete         Radiology Studies: [place] EKG SITE RITE Result Date: 05/14/2023 If Site Rite image not attached, placement could not be confirmed due to current cardiac rhythm.  VAS [place] UPPER EXTREMITY VENOUS DUPLEX Result Date: 05/13/2023 UPPER VENOUS STUDY  Patient Name:  KHRISTINE VERNO  Date of Exam:   05/13/2023 Medical Rec #: [id number]        Accession #:    [id number] Date of Birth: [birth year]        Patient Gender: F Patient Age:   60 years [facility] Procedure:      VAS [place] UPPER EXTREMITY VENOUS DUPLEX Referring Phys: Dolly Rias  --------------------------------------------------------------------------------  Indications: Swelling, and Erythema Limitations: Poor ultrasound/tissue interface and patient unable to keep arm rotated. Comparison Study: No prior study on file Performing Technologist: Sherren Kerns RVS  Examination Guidelines: A complete evaluation includes B-mode imaging, spectral Doppler, color Doppler, and power Doppler as needed of all accessible portions of each vessel. Bilateral testing is considered an integral part of a complete examination. Limited examinations for reoccurring indications may be performed as noted.  Right Findings: +----------+------------+---------+-----------+----------+---------------------+ RIGHT     CompressiblePhasicitySpontaneousProperties       Summary        +----------+------------+---------+-----------+----------+---------------------+ IJV           Full       Yes       Yes                                    +----------+------------+---------+-----------+----------+---------------------+ Subclavian    Full       Yes       Yes                                    +----------+------------+---------+-----------+----------+---------------------+  Axillary      Full       Yes       Yes                                    +----------+------------+---------+-----------+----------+---------------------+ Brachial                 Yes       Yes               patent by color and                                                             Doppler        +----------+------------+---------+-----------+----------+---------------------+ Radial                                                 patent by color    +----------+------------+---------+-----------+----------+---------------------+ Ulnar                                                  Not visualized     +----------+------------+---------+-----------+----------+---------------------+ Cephalic                  Yes       Yes               patent by color and                                                             Doppler        +----------+------------+---------+-----------+----------+---------------------+ Basilic       None                                          Acute         +----------+------------+---------+-----------+----------+---------------------+  Left Findings: +----------+------------+---------+-----------+----------+-------+ LEFT      CompressiblePhasicitySpontaneousPropertiesSummary +----------+------------+---------+-----------+----------+-------+ Subclavian               Yes       Yes                      +----------+------------+---------+-----------+----------+-------+  Summary:  Right: No evidence of deep vein thrombosis in the upper extremity. Findings consistent with acute superficial vein thrombosis involving the right basilic vein.  Left: No evidence of superficial vein thrombosis in the upper extremity.  *See table(s) above for measurements and observations.     Preliminary         Scheduled Meds:  amiodarone  200 mg Oral BID   apixaban  2.5 mg Oral BID   benztropine  0.5 mg Oral QHS   brexpiprazole  1  mg Oral QHS   Chlorhexidine Gluconate Cloth  6 each Topical Daily   docusate sodium  100 mg Oral BID   feeding supplement  237 mL Oral BID BM   insulin aspart  0-9 Units Subcutaneous Q4H   levothyroxine  88 mcg Oral Q0600   metoprolol tartrate  5 mg Intravenous Q6H   mouth rinse  15 mL Mouth Rinse 4 times per day   pantoprazole (PROTONIX) IV  40 mg Intravenous QHS   polyethylene glycol  17 g Oral Daily   venlafaxine XR  150 mg Oral q AM   Continuous Infusions:  albumin human     cefTRIAXone (ROCEPHIN)  IV     metronidazole 500 mg (05/14/23 0847)   potassium chloride 10 mEq (05/14/23 1014)   potassium PHOSPHATE IVPB (in mmol)       LOS: 5 days    Time spent: 45 minutes    Ramiro Harvest, MD [facility]   To  contact the attending provider between 7A-7P or the covering provider during after hours 7P-7A, please log into the web site [email or web address] and access using universal [place] password for that web site. If you do not have the password, please call the hospital operator.  05/14/2023, 10:30 AM

## 2023-05-14 NOTE — Progress Notes (Signed)
 DAILY PROGRESS NOTE   Patient Name: Erica [name] Date of Encounter: 05/14/2023 Cardiologist: Rollene Rotunda, MD  Chief Complaint   Sleepy  Patient Profile   Erica [name] with a hx of small bowel obstruction, hypertension, rheumatoid arthritis, bipolar disorder who is being seen 05/12/2023 for the evaluation of A-fib with RVR at the request of Ramiro Harvest MD   Subjective   Maintaining sinus rhythm today - little ectopy.  Mildly net negative overnight at -392.   Objective   Vitals:   05/13/23 2130 05/13/23 2250 05/14/23 0451 05/14/23 0455  BP: (!) 84/51 (!) 90/54 (!) 91/48   Pulse: 82 82 86   Resp: 16 18 18    Temp:  98.2 F (36.8 C) 98 F (36.7 C)   TempSrc:  Oral Oral   SpO2: 99% 99% 94%   Weight:    51.7 kg  Height:        Intake/Output Summary (Last 24 hours) at 05/14/2023 0932 Last data filed at 05/14/2023 0830 Gross per 24 hour  Intake 889.71 ml  Output 900 ml  Net -10.29 ml   Filed Weights   05/11/23 0500 05/13/23 0500 05/14/23 0455  Weight: 61.5 kg 64.8 kg 51.7 kg    Physical Exam   General appearance: alert and no distress Lungs: diminished breath sounds bibasilar Heart: irregularly irregular rhythm Extremities: edema 1+ bilateral LE and 2+ RUE edema Neurologic: Grossly normal  Inpatient Medications    Scheduled Meds:  benztropine  0.5 mg Oral QHS   brexpiprazole  1 mg Oral QHS   Chlorhexidine Gluconate Cloth  6 each Topical Daily   docusate sodium  100 mg Oral BID   feeding supplement  237 mL Oral BID BM   insulin aspart  0-9 Units Subcutaneous Q4H   levothyroxine  88 mcg Oral Q0600   metoprolol tartrate  5 mg Intravenous Q6H   mouth rinse  15 mL Mouth Rinse 4 times per day   pantoprazole (PROTONIX) IV  40 mg Intravenous QHS   polyethylene glycol  17 g Oral Daily   venlafaxine XR  150 mg Oral q AM    Continuous Infusions:  albumin human     amiodarone 30 mg/hr (05/13/23 2218)   cefTRIAXone (ROCEPHIN)  IV      heparin 1,100 Units/hr (05/14/23 0445)   metronidazole 500 mg (05/14/23 0847)   potassium chloride 10 mEq (05/14/23 0857)   potassium PHOSPHATE IVPB (in mmol)      PRN Meds: LORazepam, melatonin, mouth rinse, prochlorperazine   Labs   Results for orders placed or performed during the hospital encounter of 05/08/23 (from the past 48 hours)  Glucose, capillary     Status: Abnormal   Collection Time: 05/12/23 11:33 AM  Result Value Ref Range   Glucose-Capillary 108 (H) 70 - 99 mg/dL    Comment: Glucose reference range applies only to samples taken after fasting for at least 8 hours.  CBC     Status: Abnormal   Collection Time: 05/12/23 11:51 AM  Result Value Ref Range   WBC 15.0 (H) 4.0 - 10.5 K/uL   RBC 4.27 3.87 - 5.11 MIL/uL   Hemoglobin 13.6 12.0 - 15.0 g/dL   HCT 13.0 86.5 - 78.4 %   MCV 96.0 80.0 - 100.0 fL   MCH 31.9 26.0 - 34.0 pg   MCHC 33.2 30.0 - 36.0 g/dL   RDW 69.6 29.5 - 28.4 %   Platelets 107 (L) 150 -  400 K/uL   nRBC 0.0 0.0 - 0.2 %    Comment: Performed at [facility], [address], [place], [place] [address]  Brain natriuretic peptide     Status: Abnormal   Collection Time: 05/12/23 11:51 AM  Result Value Ref Range   B Natriuretic Peptide 344.5 (H) 0.0 - 100.0 pg/mL    Comment: Performed at [facility], [address] [place], [place] [address]  Glucose, capillary     Status: None   Collection Time: 05/12/23  3:41 PM  Result Value Ref Range   Glucose-Capillary 87 70 - 99 mg/dL    Comment: Glucose reference range applies only to samples taken after fasting for at least 8 hours.  Glucose, capillary     Status: Abnormal   Collection Time: 05/12/23  7:32 PM  Result Value Ref Range   Glucose-Capillary 105 (H) 70 - 99 mg/dL    Comment: Glucose reference range applies only to samples taken after fasting for at least 8 hours.  Urinalysis, Complete w Microscopic -Urine, Catheterized     Status: Abnormal   Collection Time: 05/12/23 10:18 PM   Result Value Ref Range   Color, Urine YELLOW YELLOW   APPearance CLEAR CLEAR   Specific Gravity, Urine 1.012 1.005 - 1.030   pH 5.0 5.0 - 8.0   Glucose, UA NEGATIVE NEGATIVE mg/dL   Hgb urine dipstick SMALL (A) NEGATIVE   Bilirubin Urine NEGATIVE NEGATIVE   Ketones, ur 20 (A) NEGATIVE mg/dL   Protein, ur 30 (A) NEGATIVE mg/dL   Nitrite NEGATIVE NEGATIVE   Leukocytes,Ua NEGATIVE NEGATIVE   RBC / HPF 0-5 0 - 5 RBC/hpf   WBC, UA 0-5 0 - 5 WBC/hpf   Bacteria, UA NONE SEEN NONE SEEN   Squamous Epithelial / HPF 0-5 0 - 5 /HPF   Mucus PRESENT     Comment: Performed at [facility] Lab, [address] [place], [place] [address]  Glucose, capillary     Status: Abnormal   Collection Time: 05/12/23 11:23 PM  Result Value Ref Range   Glucose-Capillary 119 (H) 70 - 99 mg/dL    Comment: Glucose reference range applies only to samples taken after fasting for at least 8 hours.  Resp panel by RT-PCR (RSV, Flu A&B, Covid) Anterior Nasal Swab     Status: None   Collection Time: 05/13/23 12:58 AM   Specimen: Anterior Nasal Swab  Result Value Ref Range   SARS Coronavirus 2 by RT PCR NEGATIVE NEGATIVE   Influenza A by PCR NEGATIVE NEGATIVE   Influenza B by PCR NEGATIVE NEGATIVE    Comment: (NOTE) The Xpert Xpress SARS-CoV-2/FLU/RSV plus assay is intended as an aid in the diagnosis of influenza from Nasopharyngeal swab specimens and should not be used as a sole basis for treatment. Nasal washings and aspirates are unacceptable for Xpert Xpress SARS-CoV-2/FLU/RSV testing.  Fact Sheet for Patients: BloggerCourse.com  Fact Sheet for Healthcare Providers: SeriousBroker.it  This test is not yet approved or cleared by the [place] FDA and has been authorized for detection and/or diagnosis of SARS-CoV-2 by FDA under an Emergency Use Authorization (EUA). This EUA will remain in effect (meaning this test can be used) for the duration of  the COVID-19 declaration under Section 564(b)(1) of the Act, 21 U.S.C. section 360bbb-3(b)(1), unless the authorization is terminated or revoked.     Resp Syncytial Virus by PCR NEGATIVE NEGATIVE    Comment: (NOTE) Fact Sheet for Patients: BloggerCourse.com  Fact Sheet  for Healthcare Providers: SeriousBroker.it  This test is not yet approved or cleared by the [place] and has been authorized for detection and/or diagnosis of SARS-CoV-2 by FDA under an Emergency Use Authorization (EUA). This EUA will remain in effect (meaning this test can be used) for the duration of the COVID-19 declaration under Section 564(b)(1) of the Act, 21 U.S.C. section 360bbb-3(b)(1), unless the authorization is terminated or revoked.  Performed at [facility], [address], [place], [place] [address]   Heparin level (unfractionated)     Status: None   Collection Time: 05/13/23  2:50 AM  Result Value Ref Range   Heparin Unfractionated 0.38 0.30 - 0.70 IU/mL    Comment: (NOTE) The clinical reportable range upper limit is being lowered to >1.10 to align with the FDA approved guidance for the current laboratory assay.  If heparin results are below expected values, and patient dosage has  been confirmed, suggest follow up testing of antithrombin III levels. Performed at [facility], [address], [place], [place] [address]   Basic metabolic panel     Status: Abnormal   Collection Time: 05/13/23  2:50 AM  Result Value Ref Range   Sodium 134 (L) 135 - 145 mmol/L   Potassium 3.0 (L) 3.5 - 5.1 mmol/L   Chloride 96 (L) 98 - 111 mmol/L   CO2 21 (L) 22 - 32 mmol/L   Glucose, Bld 240 (H) 70 - 99 mg/dL    Comment: Glucose reference range applies only to samples taken after fasting for at least 8 hours.   BUN 10 8 - 23 mg/dL   Creatinine, Ser 0.98 0.44 - 1.00 mg/dL   Calcium 7.3 (L) 8.9 - 10.3 mg/dL   GFR, Estimated >11 >91  mL/min    Comment: (NOTE) Calculated using the CKD-EPI Creatinine Equation (2021)    Anion gap 17 (H) 5 - 15    Comment: Performed at [facility], [address] [place], [place] [address]  Magnesium     Status: Abnormal   Collection Time: 05/13/23  2:50 AM  Result Value Ref Range   Magnesium 1.5 (L) 1.7 - 2.4 mg/dL    Comment: Performed at [facility], [address] [place], [place] [address]  CBC with Differential/Platelet     Status: Abnormal   Collection Time: 05/13/23  2:50 AM  Result Value Ref Range   WBC 11.6 (H) 4.0 - 10.5 K/uL   RBC 3.54 (L) 3.87 - 5.11 MIL/uL   Hemoglobin 11.5 (L) 12.0 - 15.0 g/dL   HCT 08.6 (L) 57.8 - 46.9 %   MCV 94.4 80.0 - 100.0 fL   MCH 32.5 26.0 - 34.0 pg   MCHC 34.4 30.0 - 36.0 g/dL   RDW 62.9 52.8 - 41.3 %   Platelets 120 (L) 150 - 400 K/uL    Comment: REPEATED TO VERIFY   nRBC 0.0 0.0 - 0.2 %   Neutrophils Relative % 73 %   Neutro Abs 8.5 (H) 1.7 - 7.7 K/uL   Lymphocytes Relative 14 %   Lymphs Abs 1.7 0.7 - 4.0 K/uL   Monocytes Relative 10 %   Monocytes Absolute 1.1 (H) 0.1 - 1.0 K/uL   Eosinophils Relative 0 %   Eosinophils Absolute 0.0 0.0 - 0.5 K/uL   Basophils Relative 0 %   Basophils Absolute 0.0 0.0 - 0.1 K/uL   Immature Granulocytes 3 %   Abs Immature Granulocytes 0.31 (H) 0.00 -  0.07 K/uL    Comment: Performed at [facility], [address] [place], [place] [address]  TSH     Status: Abnormal   Collection Time: 05/13/23  2:50 AM  Result Value Ref Range   TSH 4.600 (H) 0.350 - 4.500 uIU/mL    Comment: Performed by a 3rd Generation assay with a functional sensitivity of <=0.01 uIU/mL. Performed at [facility], [address], [place], [place] [address]   Brain natriuretic peptide     Status: Abnormal   Collection Time: 05/13/23  2:50 AM  Result Value Ref Range   B Natriuretic Peptide 303.2 (H) 0.0 - 100.0 pg/mL    Comment: Performed at [facility], [address], [place], [place]  [address]  Culture, blood (Routine X 2) w Reflex to ID Panel     Status: None (Preliminary result)   Collection Time: 05/13/23  2:50 AM   Specimen: BLOOD  Result Value Ref Range   Specimen Description BLOOD BLOOD LEFT ARM    Special Requests      BOTTLES DRAWN AEROBIC AND ANAEROBIC Blood Culture adequate volume   Culture      NO GROWTH 1 DAY Performed at [facility], [address], [place], [place] [address]    Report Status PENDING   Culture, blood (Routine X 2) w Reflex to ID Panel     Status: None (Preliminary result)   Collection Time: 05/13/23  2:50 AM   Specimen: BLOOD  Result Value Ref Range   Specimen Description BLOOD BLOOD RIGHT HAND    Special Requests AEROBIC BOTTLE ONLY Blood Culture adequate volume    Culture      NO GROWTH 1 DAY Performed at [facility], [address], [place], [place] [address]    Report Status PENDING   Albumin     Status: Abnormal   Collection Time: 05/13/23  2:50 AM  Result Value Ref Range   Albumin 2.2 (L) 3.5 - 5.0 g/dL    Comment: Performed at [facility], [address], [place], [place] [address]  Glucose, capillary     Status: Abnormal   Collection Time: 05/13/23  3:32 AM  Result Value Ref Range   Glucose-Capillary 119 (H) 70 - 99 mg/dL    Comment: Glucose reference range applies only to samples taken after fasting for at least 8 hours.  Glucose, capillary     Status: Abnormal   Collection Time: 05/13/23  8:21 AM  Result Value Ref Range   Glucose-Capillary 109 (H) 70 - 99 mg/dL    Comment: Glucose reference range applies only to samples taken after fasting for at least 8 hours.  Heparin level (unfractionated)     Status: None   Collection Time: 05/13/23 10:10 AM  Result Value Ref Range   Heparin Unfractionated 0.32 0.30 - 0.70 IU/mL    Comment: (NOTE) The clinical reportable range upper limit is being lowered to >1.10 to align with the FDA approved guidance for the current laboratory assay.  If heparin  results are below expected values, and patient dosage has  been confirmed, suggest follow up testing of antithrombin III levels. Performed at [facility], [address], [place], [place] [address]   Glucose, capillary     Status: Abnormal   Collection Time: 05/13/23 12:06 PM  Result Value Ref Range   Glucose-Capillary 116 (H) 70 - 99 mg/dL    Comment: Glucose reference range applies only to  samples taken after fasting for at least 8 hours.  Glucose, capillary     Status: Abnormal   Collection Time: 05/13/23  4:23 PM  Result Value Ref Range   Glucose-Capillary 122 (H) 70 - 99 mg/dL    Comment: Glucose reference range applies only to samples taken after fasting for at least 8 hours.  Glucose, capillary     Status: Abnormal   Collection Time: 05/13/23  7:49 PM  Result Value Ref Range   Glucose-Capillary 117 (H) 70 - 99 mg/dL    Comment: Glucose reference range applies only to samples taken after fasting for at least 8 hours.  Glucose, capillary     Status: Abnormal   Collection Time: 05/13/23 11:31 PM  Result Value Ref Range   Glucose-Capillary 128 (H) 70 - 99 mg/dL    Comment: Glucose reference range applies only to samples taken after fasting for at least 8 hours.  Heparin level (unfractionated)     Status: Abnormal   Collection Time: 05/14/23  3:20 AM  Result Value Ref Range   Heparin Unfractionated 0.17 (L) 0.30 - 0.70 IU/mL    Comment: (NOTE) The clinical reportable range upper limit is being lowered to >1.10 to align with the FDA approved guidance for the current laboratory assay.  If heparin results are below expected values, and patient dosage has  been confirmed, suggest follow up testing of antithrombin III levels. Performed at [facility], [address], [place], [place] [address]   Magnesium     Status: None   Collection Time: 05/14/23  3:20 AM  Result Value Ref Range   Magnesium 2.1 1.7 - 2.4 mg/dL    Comment: Performed at [facility] Lab,  [address], [place], [place] [address]  CBC with Differential/Platelet     Status: Abnormal   Collection Time: 05/14/23  3:20 AM  Result Value Ref Range   WBC 12.9 (H) 4.0 - 10.5 K/uL   RBC 3.16 (L) 3.87 - 5.11 MIL/uL   Hemoglobin 10.3 (L) 12.0 - 15.0 g/dL   HCT 13.0 (L) 86.5 - 78.4 %   MCV 95.3 80.0 - 100.0 fL   MCH 32.6 26.0 - 34.0 pg   MCHC 34.2 30.0 - 36.0 g/dL   RDW 69.6 29.5 - 28.4 %   Platelets 127 (L) 150 - 400 K/uL    Comment: REPEATED TO VERIFY   nRBC 0.0 0.0 - 0.2 %   Neutrophils Relative % 63 %   Neutro Abs 8.1 (H) 1.7 - 7.7 K/uL   Lymphocytes Relative 17 %   Lymphs Abs 2.2 0.7 - 4.0 K/uL   Monocytes Relative 13 %   Monocytes Absolute 1.7 (H) 0.1 - 1.0 K/uL   Eosinophils Relative 0 %   Eosinophils Absolute 0.0 0.0 - 0.5 K/uL   Basophils Relative 0 %   Basophils Absolute 0.0 0.0 - 0.1 K/uL   Immature Granulocytes 7 %   Abs Immature Granulocytes 0.86 (H) 0.00 - 0.07 K/uL    Comment: Performed at [facility], [address], [place], [place] [address]  Renal function panel     Status: Abnormal   Collection Time: 05/14/23  3:20 AM  Result Value Ref Range   Sodium 136 135 - 145 mmol/L   Potassium 3.1 (L) 3.5 - 5.1 mmol/L   Chloride 98 98 - 111 mmol/L   CO2 23 22 - 32 mmol/L   Glucose, Bld 107 (H) 70 - 99 mg/dL    Comment:  Glucose reference range applies only to samples taken after fasting for at least 8 hours.   BUN 14 8 - 23 mg/dL   Creatinine, Ser 9.56 0.44 - 1.00 mg/dL   Calcium 7.7 (L) 8.9 - 10.3 mg/dL   Phosphorus 1.2 (L) 2.5 - 4.6 mg/dL   Albumin 2.2 (L) 3.5 - 5.0 g/dL   GFR, Estimated >21 >30 mL/min    Comment: (NOTE) Calculated using the CKD-EPI Creatinine Equation (2021)    Anion gap 15 5 - 15    Comment: Performed at [facility] Lab, [address], [place], [place] [address]  Basic metabolic panel     Status: Abnormal   Collection Time: 05/14/23  3:20 AM  Result Value Ref Range   Sodium 136 135 - 145 mmol/L   Potassium 3.0 (L) 3.5 - 5.1  mmol/L   Chloride 101 98 - 111 mmol/L   CO2 23 22 - 32 mmol/L   Glucose, Bld 110 (H) 70 - 99 mg/dL    Comment: Glucose reference range applies only to samples taken after fasting for at least 8 hours.   BUN 14 8 - 23 mg/dL   Creatinine, Ser 4.69 0.44 - 1.00 mg/dL   Calcium 7.3 (L) 8.9 - 10.3 mg/dL   GFR, Estimated >62 >95 mL/min    Comment: (NOTE) Calculated using the CKD-EPI Creatinine Equation (2021)    Anion gap 12 5 - 15    Comment: Performed at [facility], [address], [place], [place] [address]  Brain natriuretic peptide     Status: Abnormal   Collection Time: 05/14/23  3:20 AM  Result Value Ref Range   B Natriuretic Peptide 130.2 (H) 0.0 - 100.0 pg/mL    Comment: Performed at [facility], [address] [place], [place] [address]  Glucose, capillary     Status: Abnormal   Collection Time: 05/14/23  4:49 AM  Result Value Ref Range   Glucose-Capillary 102 (H) 70 - 99 mg/dL    Comment: Glucose reference range applies only to samples taken after fasting for at least 8 hours.  Glucose, capillary     Status: Abnormal   Collection Time: 05/14/23  8:32 AM  Result Value Ref Range   Glucose-Capillary 126 (H) 70 - 99 mg/dL    Comment: Glucose reference range applies only to samples taken after fasting for at least 8 hours.   Comment 1 Notify RN    Comment 2 Document in Chart     ECG   N/A  Telemetry   Sinus rhythm  - Personally Reviewed  Radiology    [place] EKG SITE RITE Result Date: 05/14/2023 If Site Rite image not attached, placement could not be confirmed due to current cardiac rhythm.  VAS [place] UPPER EXTREMITY VENOUS DUPLEX Result Date: 05/13/2023 UPPER VENOUS STUDY  Patient Name:  PHOUA HOADLEY  Date of Exam:   05/13/2023 Medical Rec #: [id number]        Accession #:    [id number] Date of Birth: Mar 07, 1933        Patient Gender: F Patient Age:   56 years Exam Location:  [facility] Procedure:      VAS [place] UPPER EXTREMITY VENOUS DUPLEX Referring Phys:  Dolly Rias --------------------------------------------------------------------------------  Indications: Swelling, and Erythema Limitations: Poor ultrasound/tissue interface and patient unable to keep arm rotated. Comparison Study: No prior study on file Performing Technologist: Sherren Kerns RVS  Examination Guidelines: A complete evaluation includes B-mode imaging, spectral Doppler, color  Doppler, and power Doppler as needed of all accessible portions of each vessel. Bilateral testing is considered an integral part of a complete examination. Limited examinations for reoccurring indications may be performed as noted.  Right Findings: +----------+------------+---------+-----------+----------+---------------------+ RIGHT     CompressiblePhasicitySpontaneousProperties       Summary        +----------+------------+---------+-----------+----------+---------------------+ IJV           Full       Yes       Yes                                    +----------+------------+---------+-----------+----------+---------------------+ Subclavian    Full       Yes       Yes                                    +----------+------------+---------+-----------+----------+---------------------+ Axillary      Full       Yes       Yes                                    +----------+------------+---------+-----------+----------+---------------------+ Brachial                 Yes       Yes               patent by color and                                                             Doppler        +----------+------------+---------+-----------+----------+---------------------+ Radial                                                 patent by color    +----------+------------+---------+-----------+----------+---------------------+ Ulnar                                                  Not visualized     +----------+------------+---------+-----------+----------+---------------------+  Cephalic                 Yes       Yes               patent by color and                                                             Doppler        +----------+------------+---------+-----------+----------+---------------------+ Basilic       None  Acute         +----------+------------+---------+-----------+----------+---------------------+  Left Findings: +----------+------------+---------+-----------+----------+-------+ LEFT      CompressiblePhasicitySpontaneousPropertiesSummary +----------+------------+---------+-----------+----------+-------+ Subclavian               Yes       Yes                      +----------+------------+---------+-----------+----------+-------+  Summary:  Right: No evidence of deep vein thrombosis in the upper extremity. Findings consistent with acute superficial vein thrombosis involving the right basilic vein.  Left: No evidence of superficial vein thrombosis in the upper extremity.  *See table(s) above for measurements and observations.     Preliminary    DG CHEST PORT 1 VIEW Result Date: 05/12/2023 CLINICAL DATA:  Shortness of breath. EXAM: PORTABLE CHEST 1 VIEW COMPARISON:  One-view chest x-ray 05/09/2023 FINDINGS: The patient has been extubated.  Gastric tube was removed. The heart size is normal. Atherosclerotic changes are present at the aortic arch progressive interstitial and airspace disease is present bilaterally. Progressive bilateral effusions are present. IMPRESSION: 1. Progressive interstitial and airspace disease bilaterally consistent with progressive bilateral pneumonia and edema. 2. Progressive bilateral effusions. Electronically Signed   By: Marin Roberts M.D.   On: 05/12/2023 14:17   ECHOCARDIOGRAM COMPLETE Result Date: 05/12/2023    ECHOCARDIOGRAM REPORT   Patient Name:   ADAISHA CAMPISE Date of Exam: 05/12/2023 Medical Rec #:  [id number]       Height:       60.0 in Accession #:     [id number]      Weight:       135.6 lb Date of Birth:  [birth year]       BSA:          1.582 m Patient Age:    89 years        BP:           158/93 mmHg Patient Gender: F               HR:           106 bpm. Exam Location:  Inpatient Procedure: 2D Echo, Cardiac Doppler and Color Doppler (Both Spectral and Color            Flow Doppler were utilized during procedure). Indications:    I48.91* Unspecified atrial fibrillation  History:        Patient has prior history of Echocardiogram examinations, most                 recent 05/01/2017. Arrythmias:Atrial Fibrillation; Risk                 Factors:Hypertension and Dyslipidemia.  Sonographer:    Irving Burton Senior RDCS Referring Phys: [id number] DANIEL V THOMPSON IMPRESSIONS  1. Left ventricular ejection fraction, by estimation, is 70 to 75%. Left ventricular ejection fraction by PLAX is 75 %. The left ventricle has hyperdynamic function. The left ventricle has no regional wall motion abnormalities. Left ventricular diastolic parameters are consistent with Grade I diastolic dysfunction (impaired relaxation).  2. Right ventricular systolic function is normal. The right ventricular size is normal. There is normal pulmonary artery systolic pressure. The estimated right ventricular systolic pressure is 29.8 mmHg.  3. The mitral valve is normal in structure. Mild mitral valve regurgitation. Moderate mitral annular calcification.  4. The aortic valve is tricuspid. Aortic valve regurgitation is not visualized. Aortic valve sclerosis is present, with no evidence of aortic valve stenosis.  5. The inferior  vena cava is normal in size with greater than 50% respiratory variability, suggesting right atrial pressure of 3 mmHg. FINDINGS  Left Ventricle: Left ventricular ejection fraction, by estimation, is 70 to 75%. Left ventricular ejection fraction by PLAX is 75 %. The left ventricle has hyperdynamic function. The left ventricle has no regional wall motion abnormalities. The left ventricular  internal cavity size was normal in size. There is no left ventricular hypertrophy. Left ventricular diastolic parameters are consistent with Grade I diastolic dysfunction (impaired relaxation). Right Ventricle: The right ventricular size is normal. No increase in right ventricular wall thickness. Right ventricular systolic function is normal. There is normal pulmonary artery systolic pressure. The tricuspid regurgitant velocity is 2.59 m/s, and  with an assumed right atrial pressure of 3 mmHg, the estimated right ventricular systolic pressure is 29.8 mmHg. Left Atrium: Left atrial size was normal in size. Right Atrium: Right atrial size was normal in size. Pericardium: Trivial pericardial effusion is present. Mitral Valve: The mitral valve is normal in structure. Moderate mitral annular calcification. Mild mitral valve regurgitation. Tricuspid Valve: The tricuspid valve is normal in structure. Tricuspid valve regurgitation is trivial. Aortic Valve: The aortic valve is tricuspid. Aortic valve regurgitation is not visualized. Aortic valve sclerosis is present, with no evidence of aortic valve stenosis. Pulmonic Valve: The pulmonic valve was normal in structure. Pulmonic valve regurgitation is trivial. Aorta: The aortic root and ascending aorta are structurally normal, with no evidence of dilitation. Venous: The inferior vena cava is normal in size with greater than 50% respiratory variability, suggesting right atrial pressure of 3 mmHg. IAS/Shunts: No atrial level shunt detected by color flow Doppler.  LEFT VENTRICLE PLAX 2D LV EF:         Left            Diastology                ventricular     LV e' medial:    9.14 cm/s                ejection        LV E/e' medial:  8.4                fraction by     LV e' lateral:   9.79 cm/s                PLAX is 75      LV E/e' lateral: 7.8                %. LVIDd:         3.30 cm LVIDs:         1.90 cm LV PW:         0.80 cm LV IVS:        1.00 cm LVOT diam:     1.90 cm LV  SV:         45 LV SV Index:   29 LVOT Area:     2.84 cm  RIGHT VENTRICLE RV S prime:     15.10 cm/s TAPSE (M-mode): 1.9 cm LEFT ATRIUM             Index        RIGHT ATRIUM          Index LA diam:        2.80 cm 1.77 cm/m   RA Area:     8.27 cm LA Vol (A2C):   22.2 ml 14.03 ml/m  RA Volume:   13.60 ml 8.59 ml/m LA Vol (A4C):   36.3 ml 22.94 ml/m LA Biplane Vol: 30.3 ml 19.15 ml/m  AORTIC VALVE LVOT Vmax:   94.20 cm/s LVOT Vmean:  61.700 cm/s LVOT VTI:    0.160 m  AORTA Ao Root diam: 2.70 cm Ao Asc diam:  2.60 cm MITRAL VALVE                TRICUSPID VALVE MV Area (PHT): 2.49 cm     TR Peak grad:   26.8 mmHg MV Decel Time: 305 msec     TR Vmax:        259.00 cm/s MV E velocity: 76.50 cm/s MV A velocity: 106.00 cm/s  SHUNTS MV E/A ratio:  0.72         Systemic VTI:  0.16 m                             Systemic Diam: 1.90 cm Clearnce Hasten Electronically signed by Clearnce Hasten Signature Date/Time: 05/12/2023/10:04:37 AM    Final     Cardiac Studies   See echo above  Assessment   Principal Problem:   Acute respiratory failure with hypoxia (HCC) Active Problems:   GERD (gastroesophageal reflux disease)   Depression   Small bowel obstruction (HCC)   Hypothyroidism   Major depressive disorder, recurrent episode with anxious distress (HCC)   Rheumatoid aortitis   Bipolar disorder with severe depression (HCC)   Hypokalemia   Obstruction of bowel (HCC)   Atrial fib/flutter, transient (HCC)   SBO (small bowel obstruction) (HCC)   Pressure injury of skin   Aspiration pneumonia (HCC)   Acute diastolic CHF (congestive heart failure) (HCC)   Hypophosphatemia   Plan   D/c amiodarone - can switch to pills today 200 mg BID x 7 days, then 200 mg daily. Her diet is advancing per surgery, no plans for intervention. Some more diuresis with lasix, but minimal - given albumin today. LVEF normal, may be third spacing. Creatinine stable - can likely switch to oral Eliquis 2.5 mg BID (age >80, weight  <60 kg) today if no plans for surgery.   Time Spent Directly with Patient:  I have spent a total of 25 minutes with the patient reviewing hospital notes, telemetry, EKGs, labs and examining the patient as well as establishing an assessment and plan that was discussed personally with the patient.  > 50% of time was spent in direct patient care.  Length of Stay:  LOS: 5 days   Chrystie Nose, MD, [place], FACP  [place]  [facility]  Medical Director of the Advanced Lipid Disorders &  Cardiovascular Risk Reduction Clinic Diplomate of the American Board of Clinical Lipidology Attending Cardiologist  Direct Dial: [phone]  Fax: [phone]  Website:  [email or web address] 05/14/2023, 9:32 AM

## 2023-05-14 NOTE — Plan of Care (Signed)
  Problem: Education: Goal: Ability to describe self-care measures that may prevent or decrease complications (Diabetes Survival Skills Education) will improve Outcome: Progressing Goal: Individualized Educational Video(s) Outcome: Progressing   Problem: Fluid Volume: Goal: Ability to maintain a balanced intake and output will improve Outcome: Progressing   Problem: Coping: Goal: Ability to adjust to condition or change in health will improve Outcome: Progressing   Problem: Fluid Volume: Goal: Ability to maintain a balanced intake and output will improve Outcome: Progressing

## 2023-05-15 ENCOUNTER — Other Ambulatory Visit: Payer: Self-pay

## 2023-05-15 ENCOUNTER — Telehealth (HOSPITAL_COMMUNITY): Payer: Self-pay | Admitting: Pharmacy Technician

## 2023-05-15 ENCOUNTER — Other Ambulatory Visit (HOSPITAL_COMMUNITY): Payer: Self-pay

## 2023-05-15 DIAGNOSIS — K56609 Unspecified intestinal obstruction, unspecified as to partial versus complete obstruction: Secondary | ICD-10-CM | POA: Diagnosis not present

## 2023-05-15 DIAGNOSIS — J9601 Acute respiratory failure with hypoxia: Secondary | ICD-10-CM | POA: Diagnosis not present

## 2023-05-15 DIAGNOSIS — I5031 Acute diastolic (congestive) heart failure: Secondary | ICD-10-CM | POA: Diagnosis not present

## 2023-05-15 LAB — RENAL FUNCTION PANEL
Albumin: 3.2 g/dL — ABNORMAL LOW (ref 3.5–5.0)
Anion gap: 10 (ref 5–15)
BUN: 7 mg/dL — ABNORMAL LOW (ref 8–23)
CO2: 25 mmol/L (ref 22–32)
Calcium: 7.8 mg/dL — ABNORMAL LOW (ref 8.9–10.3)
Chloride: 104 mmol/L (ref 98–111)
Creatinine, Ser: 0.66 mg/dL (ref 0.44–1.00)
GFR, Estimated: >60 mL/min (ref >60)
Glucose, Bld: 93 mg/dL (ref 70–99)
Phosphorus: 2 mg/dL — ABNORMAL LOW (ref 2.5–4.6)
Potassium: 3.6 mmol/L (ref 3.5–5.1)
Sodium: 139 mmol/L (ref 135–145)

## 2023-05-15 LAB — CBC WITH DIFFERENTIAL/PLATELET
Abs Immature Granulocytes: 0 10*3/uL (ref 0.00–0.07)
Basophils Absolute: 0 10*3/uL (ref 0.0–0.1)
Basophils Relative: 0 %
Blasts: 2 %
Eosinophils Absolute: 0.1 10*3/uL (ref 0.0–0.5)
Eosinophils Relative: 1 %
HCT: 26.3 % — ABNORMAL LOW (ref 36.0–46.0)
Hemoglobin: 8.8 g/dL — ABNORMAL LOW (ref 12.0–15.0)
Lymphocytes Relative: 12 %
Lymphs Abs: 1.1 10*3/uL (ref 0.7–4.0)
MCH: 32.2 pg (ref 26.0–34.0)
MCHC: 33.5 g/dL (ref 30.0–36.0)
MCV: 96.3 fL (ref 80.0–100.0)
Monocytes Absolute: 0.9 10*3/uL (ref 0.1–1.0)
Monocytes Relative: 10 %
Neutro Abs: 7 10*3/uL (ref 1.7–7.7)
Neutrophils Relative %: 75 %
Platelets: 141 10*3/uL — ABNORMAL LOW (ref 150–400)
RBC: 2.73 MIL/uL — ABNORMAL LOW (ref 3.87–5.11)
RDW: 13.8 % (ref 11.5–15.5)
WBC: 9.3 10*3/uL (ref 4.0–10.5)
nRBC: 0 % (ref 0.0–0.2)
nRBC: 0 /100{WBCs}

## 2023-05-15 LAB — GLUCOSE, CAPILLARY
Glucose-Capillary: 102 mg/dL — ABNORMAL HIGH (ref 70–99)
Glucose-Capillary: 105 mg/dL — ABNORMAL HIGH (ref 70–99)
Glucose-Capillary: 88 mg/dL (ref 70–99)
Glucose-Capillary: 88 mg/dL (ref 70–99)
Glucose-Capillary: 90 mg/dL (ref 70–99)
Glucose-Capillary: 97 mg/dL (ref 70–99)

## 2023-05-15 LAB — MAGNESIUM: Magnesium: 1.7 mg/dL (ref 1.7–2.4)

## 2023-05-15 MED ORDER — LORAZEPAM 1 MG PO TABS
0.5000 mg | ORAL_TABLET | Freq: Two times a day (BID) | ORAL | Status: DC | PRN
  Administered 2023-05-15 – 2023-05-17 (×3): 0.5 mg via ORAL
  Filled 2023-05-15 (×3): qty 1

## 2023-05-15 MED ORDER — SODIUM CHLORIDE 0.9% FLUSH: 10.0000 mL | INTRAVENOUS | Status: DC | PRN

## 2023-05-15 MED ORDER — MAGNESIUM SULFATE 2 GM/50ML IV SOLN
2.0000 g | Freq: Once | INTRAVENOUS | Status: AC
  Administered 2023-05-15: 2 g via INTRAVENOUS
  Filled 2023-05-15: qty 50

## 2023-05-15 MED ORDER — POTASSIUM CHLORIDE 10 MEQ/100ML IV SOLN
10.0000 meq | INTRAVENOUS | Status: AC
  Administered 2023-05-15 (×4): 10 meq via INTRAVENOUS
  Filled 2023-05-15 (×4): qty 100

## 2023-05-15 MED ORDER — PANTOPRAZOLE SODIUM 40 MG PO TBEC
40.0000 mg | DELAYED_RELEASE_TABLET | Freq: Every day | ORAL | Status: DC
  Administered 2023-05-15 – 2023-05-17 (×3): 40 mg via ORAL
  Filled 2023-05-15 (×3): qty 1

## 2023-05-15 MED ORDER — POTASSIUM PHOSPHATES 15 MMOLE/5ML IV SOLN
30.0000 mmol | Freq: Once | INTRAVENOUS | Status: AC
  Administered 2023-05-15: 30 mmol via INTRAVENOUS
  Filled 2023-05-15: qty 10

## 2023-05-15 MED ORDER — SODIUM CHLORIDE 0.9% FLUSH
10.0000 mL | Freq: Two times a day (BID) | INTRAVENOUS | Status: DC
  Administered 2023-05-15 – 2023-05-17 (×6): 10 mL

## 2023-05-15 MED ORDER — CHLORHEXIDINE GLUCONATE CLOTH 2 % EX PADS
6.0000 | MEDICATED_PAD | Freq: Every day | CUTANEOUS | Status: DC
  Administered 2023-05-15 – 2023-05-17 (×3): 6 via TOPICAL

## 2023-05-15 NOTE — Progress Notes (Signed)
 Subjective/Chief Complaint: No complaints, a bit confused but does not state she has ab pain, having bowel function, tol liquids fine   Objective: Vital signs in last 24 hours: Temp:  [97.8 F (36.6 C)-98.2 F (36.8 C)] 98 F (36.7 C) (03/03 0735) Pulse Rate:  [88-109] 103 (03/03 0735) Resp:  [15-20] 20 (03/03 0735) BP: (100-143)/(50-72) 143/72 (03/03 0735) SpO2:  [89 %-97 %] 90 % (03/03 0735) Weight:  [48.6 kg] 48.6 kg (03/03 0424) Last BM Date : 05/14/23  Intake/Output from previous day: 03/02 0701 - 03/03 0700 In: 1364.1 [P.O.:480; I.V.:93.3; IV Piggyback:790.8] Out: 1700 [Urine:1700] Intake/Output this shift: No intake/output data recorded.  Ab soft nontender nondistended  Lab Results:  Recent Labs    05/14/23 0320 05/15/23 0335  WBC 12.9* 9.3  HGB 10.3* 8.8*  HCT 30.1* 26.3*  PLT 127* 141*   BMET Recent Labs    05/14/23 0320 05/15/23 0335  NA 136  136 139  K 3.0*  3.1* 3.6  CL 101  98 104  CO2 23  23 25   GLUCOSE 110*  107* 93  BUN 14  14 7*  CREATININE 0.70  0.71 0.66  CALCIUM 7.3*  7.7* 7.8*   PT/INR No results for input(s): "LABPROT", "INR" in the last 72 hours. ABG No results for input(s): "PHART", "HCO3" in the last 72 hours.  Invalid input(s): "PCO2", "PO2"  Studies/Results: VAS [place] UPPER EXTREMITY VENOUS DUPLEX Result Date: 05/14/2023 UPPER VENOUS STUDY  Patient Name:  Erica [name]  Date of Exam:   05/13/2023 Medical Rec #: [id number]        Accession #:    [id number] Date of Birth: 02-04-1933        Patient Gender: F Patient Age:   89 years Exam Location:  [facility] Procedure:      VAS [place] UPPER EXTREMITY VENOUS DUPLEX Referring Phys: Dolly Rias --------------------------------------------------------------------------------  Indications: Swelling, and Erythema Limitations: Poor ultrasound/tissue interface and patient unable to keep arm rotated. Comparison Study: No prior study on file Performing Technologist: Sherren Kerns RVS  Examination Guidelines: A complete evaluation includes B-mode imaging, spectral Doppler, color Doppler, and power Doppler as needed of all accessible portions of each vessel. Bilateral testing is considered an integral part of a complete examination. Limited examinations for reoccurring indications may be performed as noted.  Right Findings: +----------+------------+---------+-----------+----------+---------------------+ RIGHT     CompressiblePhasicitySpontaneousProperties       Summary        +----------+------------+---------+-----------+----------+---------------------+ IJV           Full       Yes       Yes                                    +----------+------------+---------+-----------+----------+---------------------+ Subclavian    Full       Yes       Yes                                    +----------+------------+---------+-----------+----------+---------------------+ Axillary      Full       Yes       Yes                                    +----------+------------+---------+-----------+----------+---------------------+ Brachial  Yes       Yes               patent by color and                                                             Doppler        +----------+------------+---------+-----------+----------+---------------------+ Radial                                                 patent by color    +----------+------------+---------+-----------+----------+---------------------+ Ulnar                                                  Not visualized     +----------+------------+---------+-----------+----------+---------------------+ Cephalic                 Yes       Yes               patent by color and                                                             Doppler        +----------+------------+---------+-----------+----------+---------------------+ Basilic       None                                           Acute         +----------+------------+---------+-----------+----------+---------------------+  Left Findings: +----------+------------+---------+-----------+----------+-------+ LEFT      CompressiblePhasicitySpontaneousPropertiesSummary +----------+------------+---------+-----------+----------+-------+ Subclavian               Yes       Yes                      +----------+------------+---------+-----------+----------+-------+  Summary:  Right: No evidence of deep vein thrombosis in the upper extremity. Findings consistent with acute superficial vein thrombosis involving the right basilic vein.  Left: No evidence of superficial vein thrombosis in the upper extremity.  *See table(s) above for measurements and observations.  Diagnosing physician: Coral Else MD Electronically signed by Coral Else MD on 05/14/2023 at 10:44:40 AM.    Final    [place] EKG SITE RITE Result Date: 05/14/2023 If Site Rite image not attached, placement could not be confirmed due to current cardiac rhythm.   Anti-infectives: Anti-infectives (From admission, onward)    Start     Dose/Rate Route Frequency Ordered Stop   05/14/23 0800  cefTRIAXone (ROCEPHIN) 2 g in sodium chloride 0.9 % 100 mL IVPB        2 g 200 mL/hr over 30 Minutes Intravenous Every 24 hours 05/13/23 1005 05/15/23 0833   05/13/23 2200  metroNIDAZOLE (FLAGYL) IVPB 500 mg  500 mg 100 mL/hr over 60 Minutes Intravenous Every 12 hours 05/13/23 1005 05/15/23 2159   05/09/23 1000  cefTRIAXone (ROCEPHIN) 2 g in sodium chloride 0.9 % 100 mL IVPB        2 g 200 mL/hr over 30 Minutes Intravenous Every 24 hours 05/09/23 0906 05/13/23 0923   05/09/23 1000  metroNIDAZOLE (FLAGYL) IVPB 500 mg  Status:  Discontinued        500 mg 100 mL/hr over 60 Minutes Intravenous Every 12 hours 05/09/23 0906 05/13/23 1005   05/09/23 0430  levofloxacin (LEVAQUIN) IVPB 750 mg        750 mg 100 mL/hr over 90 Minutes Intravenous  Once 05/09/23 0424  05/09/23 0602       Assessment/Plan: SBO - partial - CT w/ Distended fluid-filled stomach and small bowel into the pelvis. Distal small bowel decompressed. Findings compatible with distal small bowel obstruction. - aspiration with initial NGT placement - intubated and now s/p extubation - afebrile. WBC normal - contrast in colon on xray - advance to soft diet - Continue daily Miralax - Keep K > 4 and Mg > 2 for bowel function, k 3.0 - getting PO repletion - Mobilize for bowel function -not sure how much she eats preop but she does not need surgery     FEN: soft diet IVF per primary ID: rocephin/flagyl (aspiration) VTE: hep gtt   Per primary: Acute onset afib RVR - on amio drip HLD IBS RA BPD H/o abdominal hysterectomy, h/o colostomy s/p reversal   I reviewed vitals, labs, xray with contrast in colon  Erica [name] 05/15/2023

## 2023-05-15 NOTE — Progress Notes (Addendum)
 Patient Name: Erica [name] Date of Encounter: 05/15/2023 [facility] Cardiologist: Rollene Rotunda, MD   Interval Summary  .    89 y.o. female with a hx of small bowel obstruction, hypertension, rheumatoid arthritis, bipolar disorder, who is admitted for SBO, aspiration pneumonia, cardiology is following for A-fib/flutter with RVR and acute CHF.   Patient's daughter and son in law are at bedside, assisted history telling, reporting patient is doing better, tolerated some liquid diet intake over the past few days, did have stomach pain but no nausea or emesis. She has no NG tube in place. No OR is planned. She has no cardiac issue in the past. She has intermittent confusion.    Vital Signs .    Vitals:   05/15/23 0406 05/15/23 0424 05/15/23 0619 05/15/23 0735  BP: (!) 132/59  (!) 141/65 (!) 143/72  Pulse: (!) 102  (!) 109 (!) 103  Resp: 16  20 20   Temp: 98.2 F (36.8 C)  98.2 F (36.8 C) 98 F (36.7 C)  TempSrc: Oral  Oral Oral  SpO2: 93%  94% 90%  Weight:  48.6 kg    Height:        Intake/Output Summary (Last 24 hours) at 05/15/2023 1004 Last data filed at 05/15/2023 0947 Gross per 24 hour  Intake 1909.11 ml  Output 1700 ml  Net 209.11 ml      05/15/2023    4:24 AM 05/14/2023    4:55 AM 05/13/2023    5:00 AM  Last 3 Weights  Weight (lbs) 107 lb 2.3 oz 113 lb 15.7 oz 142 lb 13.7 oz  Weight (kg) 48.6 kg 51.7 kg 64.8 kg      Telemetry/ECG    Sinus tachycardia 90-110s, A flutter /fib has resolved   - Personally Reviewed  Physical Exam .    GEN: No acute distress.  Frail elderly  Neck: No JVD Cardiac: RRR, no murmurs, rubs, or gallops.  Respiratory: Clear to auscultation but diminished at base bilaterally. On Cross oxygen  GI: Soft, mild tenderness  MS: BLE dependent trace edema, venous stasis change noted; RUE erythema and edema noted   Assessment & Plan .     Paroxysmal A-flutter/A fib with RVR - Initially went into A-fib/flutter with RVR after being  extubated on 2/27.  Was started on IV amiodarone and IV metoprolol, but was not started on anticoagulation as it was thought that her illness was driving the afib.  She has been going in and out of afib since, therefore started on anticoagulation due to CHADS-VASc 4 (HTN, age x2, gender) and no surgery planned for SBO - TSH elevated 4.6 on 3/1, addFT4 today  - Echo 2/28 showed LVEF 70-75%, no RWMA, grade I DD, normal RV and PASP, mild MR, mod MAC, aortic sclerosis  - maintain in sinus rhythm/tachycardia now, transitioned to PO Amiodarone 200mg  BID for 7 days followed by 200mg  daily, can continue amiodarone for a short term; once PO intake tolerated, would add metoprolol XL 25mg  daily (on this at home)  - transitioned to Eliquis 2.5mg  BID (age and weight), tolerating   Acute diastolic heart failure  - patient developed worsening shortness of breath on 05/12/23. chest x-ray showed progressive airspace disease with either bilateral pneumonia or edema progressive bilateral effusions, BNP was 344.5, was on 5 L [place], Net +8 L. TTE showed EF 70 to 75% with no RWMA, G1DD, moderate MAC, mild MR. No prior CHF hx.  -s/p IV Lasix bolus, this has been  stopped 05/14/23, UOP over the past 24 hours, Net +6L - clinically euvolemic, mild tachycardia probably dehydration, would give lasix as needed basis on day to day assessment of volume status  - GDMT: hold off given PO trial for SBO  SBO Acute hypoxic respiratory failure  Aspiration pneumonia  RA Bipolar disorder - per primary team     For questions or updates, please contact [facility] Please consult [email or web address] for contact info under        Signed, Cyndi Bender, NP   89 year old with paroxysmal atrial fibrillation/flutter on amiodarone with small bowel obstruction seen by general surgery NG tube in place currently in sinus tachycardia 110 bpm  Currently getting PICC line  Paroxysmal atrial flutter/fib - Occurred after being extubated  on 2/27.  Started on IV amiodarone and IV metoprolol at that time.  Driven by underlying illness.  TSH was 4.6, free T4 has been added echo EF 75%  Can continue with amiodarone as directed above.  I think that in follow-up if she is in sinus rhythm, she may discontinue her amiodarone as an outpatient.  Acute diastolic heart failure - Lasix previously administered.  Clinically appears euvolemic. No additional medications at this time.  SBO per surgical team.  Donato Schultz, MD

## 2023-05-15 NOTE — Plan of Care (Signed)

## 2023-05-15 NOTE — Telephone Encounter (Signed)
 Patient Product/process development scientist completed.    The patient is insured through [facility]. Patient has Medicare and is not eligible for a copay card, but may be able to apply for patient assistance or Medicare RX Payment Plan (Patient Must reach out to their plan, if eligible for payment plan), if available.    Ran test claim for Eliquis 5 mg and the current 30 day co-pay is $45.00.   This test claim was processed through [facility]- copay amounts may vary at other pharmacies due to pharmacy/plan contracts, or as the patient moves through the different stages of their insurance plan.     Roland Earl, CPHT Pharmacy Technician III Certified Patient Advocate [facility] Patient Advocate Team Direct Number: [phone]  Fax: [phone]

## 2023-05-15 NOTE — Progress Notes (Signed)
 Physical Therapy Treatment Patient Details Name: Erica [name] MRN: [id number] DOB: [birth year] Today's Date: 05/15/2023   History of Present Illness Pt is 89 year old presented to [facility] ED on  05/08/23 for nausea, vomiting, diarrhea. Pt found to have partial SBO. Difficulty placing NGT led to aspiration and pt intubated and transferred to [facility]. Pt extubated 05/09/23. Pt developed afib with rvr. PMH - rheumatoid arthritis, bipolar    PT Comments  Pt tolerates treatment well, transferring multiple times between various surfaces. Pt requires physical assistance to correct a posterior lean in standing, along with physical assistance for all bed mobility. Pt does continue to fatigue quickly and will benefit from further opportunities to mobilize in an effort to improve endurance. Patient will benefit from continued inpatient follow up therapy, <3 hours/day.   If plan is discharge home, recommend the following: A lot of help with walking and/or transfers;A lot of help with bathing/dressing/bathroom;Assistance with cooking/housework;Direct supervision/assist for medications management;Direct supervision/assist for financial management;Assist for transportation;Help with stairs or ramp for entrance;Supervision due to cognitive status   Can travel by private vehicle     No  Equipment Recommendations  BSC/3in1;Wheelchair (measurements PT);Wheelchair cushion (measurements PT)    Recommendations for Other Services       Precautions / Restrictions Precautions Precautions: Fall;Other (comment) Recall of Precautions/Restrictions: Impaired Restrictions Weight Bearing Restrictions Per Provider Order: No     Mobility  Bed Mobility Overal bed mobility: Needs Assistance Bed Mobility: Supine to Sit     Supine to sit: Mod assist, HOB elevated     General bed mobility comments: increased time, verbal cues for sequencing    Transfers Overall transfer level: Needs assistance Equipment used:   (youth walker) Transfers: Sit to/from Stand, Bed to chair/wheelchair/BSC Sit to Stand: Min assist   Step pivot transfers: Min assist       General transfer comment: posterior lean, PT assist to turn walker at times. Pt performs 3 step pivot transfers during session    Ambulation/Gait                   Stairs             Wheelchair Mobility     Tilt Bed    Modified Rankin (Stroke Patients Only)       Balance Overall balance assessment: Needs assistance Sitting-balance support: No upper extremity supported, Feet supported Sitting balance-Leahy Scale: Good     Standing balance support: Bilateral upper extremity supported, Reliant on assistive device for balance Standing balance-Leahy Scale: Poor Standing balance comment: minA, tendency for posterior lean                            Communication Communication Communication: Impaired Factors Affecting Communication: Hearing impaired  Cognition Arousal: Alert Behavior During Therapy: WFL for tasks assessed/performed   PT - Cognitive impairments: Awareness                         Following commands: Intact Following commands impaired: Follows one step commands with increased time    Cueing Cueing Techniques: Verbal cues, Tactile cues  Exercises      General Comments General comments (skin integrity, edema, etc.): tachy into 120s with activity, SpO2 in low 90s on 2L [place]      Pertinent Vitals/Pain Pain Assessment Pain Assessment: Faces Faces Pain Scale: Hurts even more Pain Location: R knee Pain Descriptors / Indicators: Aching Pain Intervention(s): Monitored  during session    Home Living                          Prior Function            PT Goals (current goals can now be found in the care plan section) Acute Rehab PT Goals Patient Stated Goal: to improve activity tolerance, reduce pain Progress towards PT goals: Progressing toward goals     Frequency    Min 1X/week      PT Plan      Co-evaluation              AM-PAC PT "6 Clicks" Mobility   Outcome Measure  Help needed turning from your back to your side while in a flat bed without using bedrails?: A Little Help needed moving from lying on your back to sitting on the side of a flat bed without using bedrails?: A Lot Help needed moving to and from a bed to a chair (including a wheelchair)?: A Little Help needed standing up from a chair using your arms (e.g., wheelchair or bedside chair)?: A Little Help needed to walk in hospital room?: Total Help needed climbing 3-5 steps with a railing? : Total 6 Click Score: 13    End of Session Equipment Utilized During Treatment: Gait belt;Oxygen Activity Tolerance: Patient tolerated treatment well Patient left: in chair;with call bell/phone within reach;with chair alarm set;with family/visitor present Nurse Communication: Mobility status PT Visit Diagnosis: Unsteadiness on feet (R26.81);Other abnormalities of gait and mobility (R26.89);Muscle weakness (generalized) (M62.81)     Time: 0950-1040 PT Time Calculation (min) (ACUTE ONLY): 50 min  Charges:    $Therapeutic Activity: 38-52 mins PT General Charges $$ ACUTE PT VISIT: 1 Visit                     Arlyss Gandy, PT, DPT Acute Rehabilitation Office [phone]    Arlyss Gandy 05/15/2023, 12:11 PM

## 2023-05-15 NOTE — Progress Notes (Signed)
 Peripherally Inserted Central Catheter Placement  The IV Nurse has discussed with the patient and/or persons authorized to consent for the patient, the purpose of this procedure and the potential benefits and risks involved with this procedure.  The benefits include less needle sticks, lab draws from the catheter, and the patient may be discharged home with the catheter. Risks include, but not limited to, infection, bleeding, blood clot (thrombus formation), and puncture of an artery; nerve damage and irregular heartbeat and possibility to perform a PICC exchange if needed/ordered by physician.  Alternatives to this procedure were also discussed.  Bard Power PICC patient education guide, fact sheet on infection prevention and patient information card has been provided to patient /or left at bedside. ECG printed and left on chart.   PICC Placement Documentation  PICC Double Lumen 05/15/23 Left Basilic 40 cm 0 cm (Active)  Indication for Insertion or Continuance of Line Limited venous access - need for IV therapy >5 days (PICC only) 05/15/23 1150  Exposed Catheter (cm) 0 cm 05/15/23 1150  Site Assessment Clean, Dry, Intact 05/15/23 1150  Lumen #1 Status Flushed;Saline locked;Blood return noted 05/15/23 1150  Lumen #2 Status Flushed;Saline locked;Blood return noted 05/15/23 1150  Dressing Type Transparent;Securing device 05/15/23 1150  Dressing Status Antimicrobial disc/dressing in place 05/15/23 1150  Line Care Connections checked and tightened 05/15/23 1150  Line Adjustment (NICU/IV Team Only) No 05/15/23 1150  Dressing Intervention New dressing;Adhesive placed at insertion site (IV team only) 05/15/23 1150  Dressing Change Due 05/22/23 05/15/23 1150       Kioni Stahl 05/15/2023, 11:51 AM

## 2023-05-15 NOTE — Progress Notes (Signed)
 Dear Doctor: Janee Morn,  This patient has been identified as a candidate for PICC for the following reason (s): drug pH or osmolality (causing phlebitis, infiltration in 24 hours), drug extravasation potential with tissue necrosis (KCL, Dilantin, Dopamine, CaCl, MgSO4, chemo vesicant), poor veins/poor circulatory system (CHF, COPD, emphysema, diabetes, steroid use, IV drug abuse, etc.), and restarts due to phlebitis and infiltration in 24 hours If you agree, please write an order for the indicated device. For any questions contact the Vascular Access Team at [phone] if no answer, please leave a message.  Thank you for supporting the early vascular access assessment program.

## 2023-05-15 NOTE — Progress Notes (Signed)
 At bedside for PIV insertion. Upon assessment, right extremity edematous, red and blistering due to infiltrations. Left arm with redness. Due to the caustic nature of ordered IV medications and infiltrations, it is recommended this patient have central access. Will make provider/RN aware.

## 2023-05-15 NOTE — Progress Notes (Signed)
 PROGRESS NOTE    [name]  UJW:[id number] DOB: 1932-08-05 DOA: 05/08/2023 PCP: Aliene Beams, MD   Chief Complaint  Patient presents with   Nausea   Diarrhea    Brief Narrative:  89 year old woman who presented with 48h of nausea, vomiting and diarrhea.  Found to have SBO but difficulty passing NGT led to aspiration event. Decision made to intubate to facilitate placement as detailed in Dr Acey Lav note.  Patient intubated admitted to PCCM team placed on IV antibiotics for possible aspiration pneumonia.  Patient subsequently extubated and transferred to hospitalist team.  General surgery consulted and following.   Assessment & Plan:   Principal Problem:   Acute respiratory failure with hypoxia (HCC) Active Problems:   GERD (gastroesophageal reflux disease)   Depression   Small bowel obstruction (HCC)   Hypothyroidism   Major depressive disorder, recurrent episode with anxious distress (HCC)   Rheumatoid aortitis   Bipolar disorder with severe depression (HCC)   Hypokalemia   Obstruction of bowel (HCC)   Atrial fib/flutter, transient (HCC)   SBO (small bowel obstruction) (HCC)   Pressure injury of skin   Aspiration pneumonia (HCC)   Acute diastolic CHF (congestive heart failure) (HCC)   Hypophosphatemia  #1 acute hypoxic and hypercarbic respiratory failure -Multifactorial, felt likely secondary to aspiration pneumonia in the setting of NG tube placement, acute diastolic CHF exacerbation, atrial fibrillation. -Patient noted to be intubated and subsequently extubated. -Patient with complaints of worsening shortness of breath on 5 L high flow nasal cannula on 05/12/2023 and 05/13/2023. -Patient with clinical improvement.. -Continue empiric IV antibiotics of Rocephin and Flagyl to complete a 7-day course. -Due to patient's complaints of worsening shortness of breath chest x-ray was ordered consistent with volume overload and pneumonia, as well as BNP obtained which was  344.5. -Chest x-ray with progressive interstitial and airspace disease bilaterally consistent with progressive bilateral pneumonia and edema.  Progressive bilateral effusions. -Patient was on IV Lasix which is currently on hold.   -Patient improving clinically currently with sats of 90% on 2 L nasal cannula.  -Supportive care.   2.  Aspiration pneumonia -Patient admitted had to be intubated with NG tube placement with concerns for aspiration pneumonia. -Continue IV Rocephin and IV Flagyl to complete a 7-day course of treatment.  3.  Acute diastolic CHF exacerbation -Patient with complaints of worsening shortness of breath on 05/12/2023 as well as today 05/13/2023 on 5 L high flow nasal cannula. -Likely secondary to A-fib. -Chest x-ray done concerning for volume overload, progressive bilateral pneumonia and edema, progressive bilateral effusions. -Patient noted to have gone into transient A-fib with RVR and currently on amiodarone drip. -BNP done elevated.  -Patient noted to be +7.916 L during this hospitalization. -2D echo done with a EF of 70 to 75%,NWMA, grade 1 DD. -Saline lock IV fluids. -Patient was on Lasix 40 mg IV every 12 hours which was held on 05/14/2023.   -Patient with urine output of 1.7 L over the past 24 hours.   -Status post IV albumin x 24 hours.     -Cardiology following and appreciate input and recommendations.    4.  A-fib CHA2DS2VASC score 4 -Patient noted to go into A-fib intermittently and going into A-fib back-and-forth per telemetry, likely secondary to small bowel obstruction, aspiration pneumonia. -Patient placed on amiodarone drip and currently on scheduled IV metoprolol. -2D echo obtained with a EF of 70 to 75%, NWMA, grade 1 DD. -Patient noted to be in normal sinus rhythm  this morning.  -Patient being followed by cardiology and IV amiodarone has been transitioned to oral amiodarone pills 200 mg twice daily x 7 days then 200 mg daily. -Patient s/p IV albumin  today. -Lasix held per cardiology on 05/14/2023.  -IV heparin has been transitioned to oral Eliquis 2.5 mg twice daily today per cardiology recommendations. -Currently on IV Lopressor, per cardiology once p.o. intake tolerated we will add metoprolol XL 25 mg daily. -Cardiology following and appreciate input and recommendations.  5.  SBO -Patient noted to have NG tube placed however NG tube noted to have been dislodged overnight. -Patient with bowel movement overnight per daughter.  -Patient states some improvement with abdominal pain. -CT abdomen and pelvis done on admission with distended fluid-filled stomach and small bowel into the pelvis.  Distal small bowel decompressed.  Findings compatible with distal small bowel obstruction.  Large hiatal hernia, fluid-filled. -Abdominal films ordered this morning with previously administered enteric contrast remains within nondilated colon.  No significant retained contrast within the small bowel. -NG tube noted to have been pulled out per patient on 05/11/2023. -Patient being followed by general surgery and being managed by general surgery.   -Patient on MiraLAX daily as well as received a Dulcolax suppository on 05/13/2023, per general surgery and having bowel movements yesterday and this morning. -Tolerating full liquid diet and diet advanced to a soft diet this morning per general surgery. -Keep potassium approximately 4, magnesium approximately 2. -Per general surgery.  6.  Hypokalemia/hypomagnesemia/hypophosphatemia -Patient was on IV Lasix which was held on 05/14/2023. -Potassium at 3.6 this morning, phosphorus at 2.0. -KCl 10 mEq IV every hour x 4 runs. -K-Phos 30 mmol IV x 1. -Repeat labs in the AM.  7.  Thrombocytopenia -Improving. -Patient with no overt bleeding.  8.  History of rheumatoid arthritis -Outpatient follow-up.  9.  Hypothyroidism -Synthroid.   10. Bipolar disorder/depression -Continue home regimen Effexor and Rexulti..    11.  GERD -Change IV PPI to oral PPI.  12.  Pressure injury, POA Pressure Injury 05/09/23 Coccyx Stage 1 -  Intact skin with non-blanchable redness of a localized area usually over a bony prominence. (Active)  05/09/23 0930  Location: Coccyx  Location Orientation:   Staging: Stage 1 -  Intact skin with non-blanchable redness of a localized area usually over a bony prominence.  Wound Description (Comments):   Present on Admission: Yes         DVT prophylaxis: Heparin drip>>> Eliquis Code Status: DNR with interventions Family Communication: Updated patient and daughter and son-in-law at bedside. Disposition: TBD  Status is: Inpatient Remains inpatient appropriate because: Severity of illness   Consultants:  Admitted to PCCM General Surgery: Dr.Tsuei 05/09/2023 Cardiology: Dr. Antoine Poche 05/12/2023  Procedures:  CT abdomen and pelvis 05/08/2023 Chest x-ray 05/11/2022 Abdominal films 05/09/2023, 05/11/2023, 05/12/2023. 2D echo 05/12/2023 PICC line pending 05/15/2023  Significant Hospital Events: Including procedures, antibiotic start and stop dates in addition to other pertinent events   2/25 admitted to ICU, was intubated for aspiration during NGT placement. Extubated in evening  2/26 improved pressures, going to try a small dose of metop for rate     Antimicrobials:  Anti-infectives (From admission, onward)    Start     Dose/Rate Route Frequency Ordered Stop   05/14/23 0800  cefTRIAXone (ROCEPHIN) 2 g in sodium chloride 0.9 % 100 mL IVPB        2 g 200 mL/hr over 30 Minutes Intravenous Every 24 hours 05/13/23 1005 05/15/23 0834  05/13/23 2200  metroNIDAZOLE (FLAGYL) IVPB 500 mg        500 mg 100 mL/hr over 60 Minutes Intravenous Every 12 hours 05/13/23 1005 05/15/23 0940   05/09/23 1000  cefTRIAXone (ROCEPHIN) 2 g in sodium chloride 0.9 % 100 mL IVPB        2 g 200 mL/hr over 30 Minutes Intravenous Every 24 hours 05/09/23 0906 05/13/23 0923   05/09/23 1000   metroNIDAZOLE (FLAGYL) IVPB 500 mg  Status:  Discontinued        500 mg 100 mL/hr over 60 Minutes Intravenous Every 12 hours 05/09/23 0906 05/13/23 1005   05/09/23 0430  levofloxacin (LEVAQUIN) IVPB 750 mg        750 mg 100 mL/hr over 90 Minutes Intravenous  Once 05/09/23 0424 05/09/23 0602         Subjective: Patient sitting up in bed, daughter and son-in-law at bedside.  Patient denies any chest pain.  States still with some shortness of breath but no significant change.  Patient denies any significant abdominal pain.  Noted to be having bowel movement noted to have a mushy bowel movement this morning per RN.  Currently in sinus rhythm. .  Objective: Vitals:   05/15/23 0406 05/15/23 0424 05/15/23 0619 05/15/23 0735  BP: (!) 132/59  (!) 141/65 (!) 143/72  Pulse: (!) 102  (!) 109 (!) 103  Resp: 16  20 20   Temp: 98.2 F (36.8 C)  98.2 F (36.8 C) 98 F (36.7 C)  TempSrc: Oral  Oral Oral  SpO2: 93%  94% 90%  Weight:  48.6 kg    Height:        Intake/Output Summary (Last 24 hours) at 05/15/2023 1015 Last data filed at 05/15/2023 0947 Gross per 24 hour  Intake 1909.11 ml  Output 1700 ml  Net 209.11 ml   Filed Weights   05/13/23 0500 05/14/23 0455 05/15/23 0424  Weight: 64.8 kg 51.7 kg 48.6 kg    Examination:  General exam: NAD. Respiratory system: Some bibasilar crackles otherwise clear.  No wheezing.  Fair air movement.  Cardiovascular system: Tachycardia, no murmurs rubs or gallops.  Trace bilateral lower extremity edema.  Gastrointestinal system: Abdomen is mildly distended, soft, nontender to palpation.  Positive bowel sounds.  No rebound.  No guarding. Central nervous system: Alert and oriented.  Moving extremities spontaneously.  No focal neurological deficits. Extremities: Right upper extremity swelling improved.  Trace bilateral lower extremity edema. Skin: No rashes, lesions or ulcers Psychiatry: Judgement and insight appear fair. Mood & affect appropriate.      Data Reviewed: I have personally reviewed following labs and imaging studies  CBC: Recent Labs  Lab 05/09/23 0440 05/09/23 0747 05/09/23 0805 05/10/23 0528 05/12/23 1151 05/13/23 0250 05/14/23 0320 05/15/23 0335  WBC 6.7 4.8  --  14.1* 15.0* 11.6* 12.9* 9.3  NEUTROABS 4.5 3.0  --   --   --  8.5* 8.1* 7.0  HGB 13.0 14.7   < > 13.4 13.6 11.5* 10.3* 8.8*  HCT 39.2 45.7   < > 40.4 41.0 33.4* 30.1* 26.3*  MCV 97.5 99.8  --  99.5 96.0 94.4 95.3 96.3  PLT 155 128*  --  98* 107* 120* 127* 141*   < > = values in this interval not displayed.    Basic Metabolic Panel: Recent Labs  Lab 05/09/23 0747 05/09/23 0805 05/10/23 0272 05/11/23 5366 05/11/23 1708 05/12/23 0438 05/13/23 0250 05/14/23 0320 05/15/23 0335  NA 136   < > 136   < >  139 139 134* 136  136 139  K 3.2*   < > 4.2   < > 4.0 3.4* 3.0* 3.0*  3.1* 3.6  CL 108  --  107   < > 107 105 96* 101  98 104  CO2 18*  --  17*   < > 20* 19* 21* 23  23 25   GLUCOSE 134*  --  94   < > 97 99 240* 110*  107* 93  BUN 25*  --  18   < > 9 10 10 14  14  7*  CREATININE 1.06*  --  1.12*   < > 0.67 0.78 0.75 0.70  0.71 0.66  CALCIUM 8.0*  --  7.9*   < > 7.9* 7.6* 7.3* 7.3*  7.7* 7.8*  MG 1.7  --  2.2  --   --  2.0 1.5* 2.1 1.7  PHOS 3.9  --  2.7  --   --   --   --  1.2* 2.0*   < > = values in this interval not displayed.    GFR: Estimated Creatinine Clearance: 34.9 mL/min (by C-G formula based on SCr of 0.66 mg/dL).  Liver Function Tests: Recent Labs  Lab 05/08/23 2120 05/09/23 0747 05/13/23 0250 05/14/23 0320 05/15/23 0335  AST 17 26  --   --   --   ALT 13 14  --   --   --   ALKPHOS 67 50  --   --   --   BILITOT 0.4 0.7  --   --   --   PROT 8.1 7.0  --   --   --   ALBUMIN 4.6 3.3* 2.2* 2.2* 3.2*    CBG: Recent Labs  Lab 05/14/23 1552 05/14/23 2034 05/15/23 0025 05/15/23 0408 05/15/23 0804  GLUCAP 94 115* 88 90 88     Recent Results (from the past 240 hours)  MRSA Next Gen by PCR, Nasal     Status:  None   Collection Time: 05/09/23  8:52 AM   Specimen: Nasal Mucosa; Nasal Swab  Result Value Ref Range Status   MRSA by PCR Next Gen NOT DETECTED NOT DETECTED Final    Comment: (NOTE) The GeneXpert MRSA Assay (FDA approved for NASAL specimens only), is one component of a comprehensive MRSA colonization surveillance program. It is not intended to diagnose MRSA infection nor to guide or monitor treatment for MRSA infections. Test performance is not FDA approved in patients less than 66 years old. Performed at [facility], [address] [place], [place] [address]   Resp panel by RT-PCR (RSV, Flu A&B, Covid) Anterior Nasal Swab     Status: None   Collection Time: 05/13/23 12:58 AM   Specimen: Anterior Nasal Swab  Result Value Ref Range Status   SARS Coronavirus 2 by RT PCR NEGATIVE NEGATIVE Final   Influenza A by PCR NEGATIVE NEGATIVE Final   Influenza B by PCR NEGATIVE NEGATIVE Final    Comment: (NOTE) The Xpert Xpress SARS-CoV-2/FLU/RSV plus assay is intended as an aid in the diagnosis of influenza from Nasopharyngeal swab specimens and should not be used as a sole basis for treatment. Nasal washings and aspirates are unacceptable for Xpert Xpress SARS-CoV-2/FLU/RSV testing.  Fact Sheet for Patients: BloggerCourse.com  Fact Sheet for Healthcare Providers: SeriousBroker.it  This test is not yet approved or cleared by the [place] FDA and has been authorized for detection and/or diagnosis of SARS-CoV-2 by FDA under an Emergency Use Authorization (  EUA). This EUA will remain in effect (meaning this test can be used) for the duration of the COVID-19 declaration under Section 564(b)(1) of the Act, 21 U.S.C. section 360bbb-3(b)(1), unless the authorization is terminated or revoked.     Resp Syncytial Virus by PCR NEGATIVE NEGATIVE Final    Comment: (NOTE) Fact Sheet for  Patients: BloggerCourse.com  Fact Sheet for Healthcare Providers: SeriousBroker.it  This test is not yet approved or cleared by the [place] FDA and has been authorized for detection and/or diagnosis of SARS-CoV-2 by FDA under an Emergency Use Authorization (EUA). This EUA will remain in effect (meaning this test can be used) for the duration of the COVID-19 declaration under Section 564(b)(1) of the Act, 21 U.S.C. section 360bbb-3(b)(1), unless the authorization is terminated or revoked.  Performed at [facility], [address] [place], [place] [address]   Culture, blood (Routine X 2) w Reflex to ID Panel     Status: None (Preliminary result)   Collection Time: 05/13/23  2:50 AM   Specimen: BLOOD  Result Value Ref Range Status   Specimen Description BLOOD BLOOD LEFT ARM  Final   Special Requests   Final    BOTTLES DRAWN AEROBIC AND ANAEROBIC Blood Culture adequate volume   Culture   Final    NO GROWTH 1 DAY Performed at [facility], [address], [place], [place] [address]    Report Status PENDING  Incomplete  Culture, blood (Routine X 2) w Reflex to ID Panel     Status: None (Preliminary result)   Collection Time: 05/13/23  2:50 AM   Specimen: BLOOD  Result Value Ref Range Status   Specimen Description BLOOD BLOOD RIGHT HAND  Final   Special Requests AEROBIC BOTTLE ONLY Blood Culture adequate volume  Final   Culture   Final    NO GROWTH 1 DAY Performed at [facility], [address], [place], [place] [address]    Report Status PENDING  Incomplete         Radiology Studies: [place] EKG SITE RITE Result Date: 05/15/2023 If Site Rite image not attached, placement could not be confirmed due to current cardiac rhythm.  VAS [place] UPPER EXTREMITY VENOUS DUPLEX Result Date: 05/14/2023 UPPER VENOUS STUDY  Patient Name:  Erica [name]  Date of Exam:   05/13/2023 Medical Rec #: [id number]        Accession #:     [id number] Date of Birth: Nov 27, 1932        Patient Gender: F Patient Age:   80 years Exam Location:  [facility] Procedure:      VAS [place] UPPER EXTREMITY VENOUS DUPLEX Referring Phys: Dolly Rias --------------------------------------------------------------------------------  Indications: Swelling, and Erythema Limitations: Poor ultrasound/tissue interface and patient unable to keep arm rotated. Comparison Study: No prior study on file Performing Technologist: Sherren Kerns RVS  Examination Guidelines: A complete evaluation includes B-mode imaging, spectral Doppler, color Doppler, and power Doppler as needed of all accessible portions of each vessel. Bilateral testing is considered an integral part of a complete examination. Limited examinations for reoccurring indications may be performed as noted.  Right Findings: +----------+------------+---------+-----------+----------+---------------------+ RIGHT     CompressiblePhasicitySpontaneousProperties       Summary        +----------+------------+---------+-----------+----------+---------------------+ IJV           Full       Yes       Yes                                    +----------+------------+---------+-----------+----------+---------------------+  Subclavian    Full       Yes       Yes                                    +----------+------------+---------+-----------+----------+---------------------+ Axillary      Full       Yes       Yes                                    +----------+------------+---------+-----------+----------+---------------------+ Brachial                 Yes       Yes               patent by color and                                                             Doppler        +----------+------------+---------+-----------+----------+---------------------+ Radial                                                 patent by color     +----------+------------+---------+-----------+----------+---------------------+ Ulnar                                                  Not visualized     +----------+------------+---------+-----------+----------+---------------------+ Cephalic                 Yes       Yes               patent by color and                                                             Doppler        +----------+------------+---------+-----------+----------+---------------------+ Basilic       None                                          Acute         +----------+------------+---------+-----------+----------+---------------------+  Left Findings: +----------+------------+---------+-----------+----------+-------+ LEFT      CompressiblePhasicitySpontaneousPropertiesSummary +----------+------------+---------+-----------+----------+-------+ Subclavian               Yes       Yes                      +----------+------------+---------+-----------+----------+-------+  Summary:  Right: No evidence of deep vein thrombosis in the upper extremity. Findings consistent with acute superficial vein thrombosis involving the right basilic vein.  Left: No evidence of superficial vein thrombosis  in the upper extremity.  *See table(s) above for measurements and observations.  Diagnosing physician: Coral Else MD Electronically signed by Coral Else MD on 05/14/2023 at 10:44:40 AM.    Final    [place] EKG SITE RITE Result Date: 05/14/2023 If Site Rite image not attached, placement could not be confirmed due to current cardiac rhythm.       Scheduled Meds:  amiodarone  200 mg Oral BID   apixaban  2.5 mg Oral BID   benztropine  0.5 mg Oral QHS   brexpiprazole  1 mg Oral QHS   docusate sodium  100 mg Oral BID   feeding supplement  237 mL Oral BID BM   insulin aspart  0-9 Units Subcutaneous Q4H   levothyroxine  88 mcg Oral Q0600   metoprolol tartrate  5 mg Intravenous Q6H   mouth rinse  15 mL Mouth  Rinse 4 times per day   pantoprazole (PROTONIX) IV  40 mg Intravenous QHS   polyethylene glycol  17 g Oral Daily   venlafaxine XR  150 mg Oral q AM   Continuous Infusions:  magnesium sulfate bolus IVPB 2 g (05/15/23 0950)   potassium chloride 50 mL/hr at 05/15/23 0947   potassium PHOSPHATE IVPB (in mmol)       LOS: 6 days    Time spent: 40 minutes    Ramiro Harvest, MD [facility]   To contact the attending provider between 7A-7P or the covering provider during after hours 7P-7A, please log into the web site [email or web address] and access using universal [place] password for that web site. If you do not have the password, please call the hospital operator.  05/15/2023, 10:15 AM

## 2023-05-16 DIAGNOSIS — I5031 Acute diastolic (congestive) heart failure: Secondary | ICD-10-CM | POA: Diagnosis not present

## 2023-05-16 DIAGNOSIS — J9601 Acute respiratory failure with hypoxia: Secondary | ICD-10-CM | POA: Diagnosis not present

## 2023-05-16 DIAGNOSIS — K56609 Unspecified intestinal obstruction, unspecified as to partial versus complete obstruction: Secondary | ICD-10-CM | POA: Diagnosis not present

## 2023-05-16 LAB — CBC
HCT: 27.6 % — ABNORMAL LOW (ref 36.0–46.0)
Hemoglobin: 9 g/dL — ABNORMAL LOW (ref 12.0–15.0)
MCH: 31.9 pg (ref 26.0–34.0)
MCHC: 32.6 g/dL (ref 30.0–36.0)
MCV: 97.9 fL (ref 80.0–100.0)
Platelets: 171 10*3/uL (ref 150–400)
RBC: 2.82 MIL/uL — ABNORMAL LOW (ref 3.87–5.11)
RDW: 13.9 % (ref 11.5–15.5)
WBC: 10.7 10*3/uL — ABNORMAL HIGH (ref 4.0–10.5)
nRBC: 0 % (ref 0.0–0.2)

## 2023-05-16 LAB — RENAL FUNCTION PANEL
Albumin: 3 g/dL — ABNORMAL LOW (ref 3.5–5.0)
Anion gap: 8 (ref 5–15)
BUN: 5 mg/dL — ABNORMAL LOW (ref 8–23)
CO2: 24 mmol/L (ref 22–32)
Calcium: 8.3 mg/dL — ABNORMAL LOW (ref 8.9–10.3)
Chloride: 105 mmol/L (ref 98–111)
Creatinine, Ser: 0.69 mg/dL (ref 0.44–1.00)
GFR, Estimated: >60 mL/min (ref >60)
Glucose, Bld: 95 mg/dL (ref 70–99)
Phosphorus: 3 mg/dL (ref 2.5–4.6)
Potassium: 4.4 mmol/L (ref 3.5–5.1)
Sodium: 137 mmol/L (ref 135–145)

## 2023-05-16 LAB — FOLATE: Folate: >40 ng/mL (ref >5.9)

## 2023-05-16 LAB — GLUCOSE, CAPILLARY
Glucose-Capillary: 107 mg/dL — ABNORMAL HIGH (ref 70–99)
Glucose-Capillary: 112 mg/dL — ABNORMAL HIGH (ref 70–99)
Glucose-Capillary: 114 mg/dL — ABNORMAL HIGH (ref 70–99)
Glucose-Capillary: 114 mg/dL — ABNORMAL HIGH (ref 70–99)
Glucose-Capillary: 142 mg/dL — ABNORMAL HIGH (ref 70–99)
Glucose-Capillary: 81 mg/dL (ref 70–99)
Glucose-Capillary: 98 mg/dL (ref 70–99)

## 2023-05-16 LAB — IRON AND TIBC
Iron: 27 ug/dL — ABNORMAL LOW (ref 28–170)
Saturation Ratios: 18 % (ref 10.4–31.8)
TIBC: 147 ug/dL — ABNORMAL LOW (ref 250–450)
UIBC: 120 ug/dL

## 2023-05-16 LAB — FERRITIN: Ferritin: 300 ng/mL (ref 11–307)

## 2023-05-16 LAB — T4, FREE: Free T4: 0.7 ng/dL (ref 0.61–1.12)

## 2023-05-16 LAB — VITAMIN B12: Vitamin B-12: 1401 pg/mL — ABNORMAL HIGH (ref 180–914)

## 2023-05-16 LAB — MAGNESIUM: Magnesium: 2 mg/dL (ref 1.7–2.4)

## 2023-05-16 MED ORDER — SIMETHICONE 80 MG PO CHEW
80.0000 mg | CHEWABLE_TABLET | Freq: Four times a day (QID) | ORAL | Status: DC
  Administered 2023-05-16 – 2023-05-18 (×9): 80 mg via ORAL
  Filled 2023-05-16 (×9): qty 1

## 2023-05-16 MED ORDER — BISACODYL 10 MG RE SUPP
10.0000 mg | Freq: Once | RECTAL | Status: AC
  Administered 2023-05-16: 10 mg via RECTAL
  Filled 2023-05-16: qty 1

## 2023-05-16 MED ORDER — METOPROLOL TARTRATE 25 MG PO TABS
25.0000 mg | ORAL_TABLET | Freq: Two times a day (BID) | ORAL | Status: DC
  Administered 2023-05-16: 25 mg via ORAL
  Filled 2023-05-16: qty 1

## 2023-05-16 MED ORDER — METOPROLOL SUCCINATE ER 50 MG PO TB24
50.0000 mg | ORAL_TABLET | Freq: Every day | ORAL | Status: DC
  Administered 2023-05-17 – 2023-05-18 (×2): 50 mg via ORAL
  Filled 2023-05-16 (×2): qty 1

## 2023-05-16 MED ORDER — FUROSEMIDE 10 MG/ML IJ SOLN
40.0000 mg | Freq: Once | INTRAMUSCULAR | Status: AC
  Administered 2023-05-16: 40 mg via INTRAVENOUS
  Filled 2023-05-16: qty 4

## 2023-05-16 MED ORDER — METOPROLOL TARTRATE 25 MG PO TABS
25.0000 mg | ORAL_TABLET | Freq: Every day | ORAL | Status: AC
  Administered 2023-05-16: 25 mg via ORAL
  Filled 2023-05-16: qty 1

## 2023-05-16 NOTE — Progress Notes (Signed)
 Titrated O2 to room air.  Patient's O2 sats dropped to 88% awake sitting in bed.  86-87% while sleeping.  Replaced 2LNC. O2 sats 95% on 2LNC.

## 2023-05-16 NOTE — Progress Notes (Addendum)
 Patient Name: Erica [name] Date of Encounter: 05/16/2023 [facility] Cardiologist: Rollene Rotunda, MD   Interval Summary  .    Feeling better today, seems to be eating better.  Still maintaining NSR.  No chest pain shortness of breath.  Vital Signs .    Vitals:   05/16/23 0005 05/16/23 0100 05/16/23 0407 05/16/23 0537  BP: 130/63  125/64 137/71  Pulse: (!) 106 92 99 98  Resp: 18 16 19 19   Temp: 98.1 F (36.7 C)  98 F (36.7 C)   TempSrc: Oral  Oral   SpO2: 92% 93% 95% 95%  Weight: 53.5 kg     Height:        Intake/Output Summary (Last 24 hours) at 05/16/2023 0824 Last data filed at 05/16/2023 0408 Gross per 24 hour  Intake 831.32 ml  Output 1502 ml  Net -670.68 ml      05/16/2023   12:05 AM 05/15/2023    4:24 AM 05/14/2023    4:55 AM  Last 3 Weights  Weight (lbs) 117 lb 15.1 oz 107 lb 2.3 oz 113 lb 15.7 oz  Weight (kg) 53.5 kg 48.6 kg 51.7 kg      Telemetry/ECG    Still maintaining sinus rhythm heart rates 90-100- Personally Reviewed  CV Studies    Echocardiogram 05/12/2023  1. Left ventricular ejection fraction, by estimation, is 70 to 75%. Left  ventricular ejection fraction by PLAX is 75 %. The left ventricle has  hyperdynamic function. The left ventricle has no regional wall motion  abnormalities. Left ventricular  diastolic parameters are consistent with Grade I diastolic dysfunction  (impaired relaxation).   2. Right ventricular systolic function is normal. The right ventricular  size is normal. There is normal pulmonary artery systolic pressure. The  estimated right ventricular systolic pressure is 29.8 mmHg.   3. The mitral valve is normal in structure. Mild mitral valve  regurgitation. Moderate mitral annular calcification.   4. The aortic valve is tricuspid. Aortic valve regurgitation is not  visualized. Aortic valve sclerosis is present, with no evidence of aortic  valve stenosis.   5. The inferior vena cava is normal in size with greater  than 50%  respiratory variability, suggesting right atrial pressure of 3 mmHg.    Physical Exam .   GEN: No acute distress.   Neck: No JVD, upright but now able to lay her flat in her chair  Cardiac: RRR, no murmurs, rubs, or gallops.  Respiratory:+ crackles  GI: Soft, nontender, distended,  MS: mild edema  Patient Profile    ILYNN STAUFFER is a 89 y.o. female has hx of hypertension, RA, bipolar, SBO.  Admitted for aspiration pneumonia (was intubated now extubated) and SBO not requiring surgery.  Cardiology asked to see due to new A-fib/flutter.  Assessment & Plan .     PAF First noted 2/27 after extubation.  Currently maintaining sinus rhythm and transitioned back to p.o. amiodarone.  Continue reduced dose of Eliquis 2.5 mg twice daily for age and weight Plan is for p.o. amiodarone 200 mg twice daily for 7 days followed by 200 mg thereafter.  She is maintaining sinus at follow-up can likely discontinue.  This titration was started on 05/14/2023 She has been getting IV Lopressor 5 mg every 6, now able to take p.o. reliably we will transition this to 25mg  BID.  TSH 4.6 T4 normal.  Acute HFpEF Echo with hyperdynamic function LVEF 70 to 75%, normal RV.  Mild MR, moderate MAC.  She looks mildly volume up today, we are spot dosing her Lasix as needed. Will give 1 dose of IV Lasix 40 mg  SBO Aspiration pneumonia Per primary team  I will get her follow-up with A-fib clinic  For questions or updates, please contact [facility] Please consult [email or web address] for contact info under        Signed, Abagail Kitchens, PA-C    Personally seen and examined. Agree with above.  Heart rate 89 year old female with small bowel obstruction, hypertension and rheumatoid arthritis atrial fibrillation flutter paroxysmal  Regular rate and rhythm.  Pleasant.  Paroxysmal atrial fibrillation - Noted back on 05/11/2023 after extubation.  Currently maintaining sinus rhythm on p.o. amiodarone.   Taper plan as described above agree. -Transitioning to metoprolol ER 50 mg once a day.  Acute diastolic heart failure - ECHO EF 75% with mild MR.  1 dose of IV Lasix given today.  SBO and aspiration pneumonia per primary team.  She will have follow-up in atrial fibrillation clinic.  Will sign off. Spoke with family and Dr. Janee Morn.   Donato Schultz, MD

## 2023-05-16 NOTE — Progress Notes (Signed)
 PROGRESS NOTE    Erica [name]  ZOX:[id number] DOB: [birth year] DOA: 05/08/2023 PCP: Aliene Beams, MD   Chief Complaint  Patient presents with   Nausea   Diarrhea    Brief Narrative:  89 year old woman who presented with 48h of nausea, vomiting and diarrhea.  Found to have SBO but difficulty passing NGT led to aspiration event. Decision made to intubate to facilitate placement as detailed in Dr Acey Lav note.  Patient intubated admitted to PCCM team placed on IV antibiotics for possible aspiration pneumonia.  Patient subsequently extubated and transferred to hospitalist team.  General surgery consulted and following.   Assessment & Plan:   Principal Problem:   Acute respiratory failure with hypoxia (HCC) Active Problems:   GERD (gastroesophageal reflux disease)   Depression   Small bowel obstruction (HCC)   Hypothyroidism   Major depressive disorder, recurrent episode with anxious distress (HCC)   Rheumatoid aortitis   Bipolar disorder with severe depression (HCC)   Hypokalemia   Obstruction of bowel (HCC)   Atrial fib/flutter, transient (HCC)   SBO (small bowel obstruction) (HCC)   Pressure injury of skin   Aspiration pneumonia (HCC)   Acute diastolic CHF (congestive heart failure) (HCC)   Hypophosphatemia  #1 acute hypoxic and hypercarbic respiratory failure -Multifactorial, felt likely secondary to aspiration pneumonia in the setting of NG tube placement, acute diastolic CHF exacerbation, atrial fibrillation. -Patient noted to be intubated and subsequently extubated. -Patient with complaints of worsening shortness of breath on 5 L high flow nasal cannula on 05/12/2023 and 05/13/2023. -Patient with clinical improvement.. -Status post 7-day course of IV Rocephin and Flagyl.  -Due to patient's complaints of worsening shortness of breath chest x-ray was ordered consistent with volume overload and pneumonia, as well as BNP obtained which was 344.5. -Chest x-ray with  progressive interstitial and airspace disease bilaterally consistent with progressive bilateral pneumonia and edema.  Progressive bilateral effusions. -Patient was on IV Lasix which was held and changed to as needed per cardiology.   -Patient to receive a dose of IV Lasix today.   -Check ambulatory sats.   -Supportive care.    2.  Aspiration pneumonia -Patient admitted had to be intubated with NG tube placement with concerns for aspiration pneumonia. -Status post 7 days IV Rocephin and Flagyl.   -No further antibiotics needed at this time.   3.  Acute diastolic CHF exacerbation -Patient with complaints of worsening shortness of breath on 05/12/2023 as well as today 05/13/2023 on 5 L high flow nasal cannula. -Likely secondary to A-fib. -Chest x-ray done concerning for volume overload, progressive bilateral pneumonia and edema, progressive bilateral effusions. -Patient noted to have gone into transient A-fib with RVR and currently on amiodarone drip. -BNP done elevated.  -Patient noted to be +7.916 L during this hospitalization. -2D echo done with a EF of 70 to 75%,NWMA, grade 1 DD. -Saline lock IV fluids. -Patient was on Lasix 40 mg IV every 12 hours which was held on 05/14/2023.   -Patient with urine output of 1.5 L over the past 24 hours.   -Status post IV albumin x 24 hours.     -IV Lasix ordered this morning per cardiology and recommending Lasix as needed. -Cardiology was following but have signed off as of 05/16/2023.   4.  A-fib CHA2DS2VASC score 4 -Patient noted to go into A-fib intermittently and going into A-fib back-and-forth per telemetry, likely secondary to small bowel obstruction, aspiration pneumonia. -Patient placed on amiodarone drip and currently on scheduled  IV metoprolol. -2D echo obtained with a EF of 70 to 75%, NWMA, grade 1 DD. -Patient noted to be in normal sinus rhythm this morning.  -Patient being followed by cardiology and IV amiodarone has been transitioned to oral  amiodarone pills 200 mg twice daily x 7 days then 200 mg daily. -Patient s/p IV albumin. -Lasix held per cardiology on 05/14/2023.  -IV heparin transitioned to oral Eliquis 2.5 mg twice daily per cardiology recommendations. -Patient being transition from IV Lopressor to metoprolol ER 50 mg daily per cardiology.   -Will need outpatient follow-up in A-fib clinic and further recommendations were made at that time in terms of continuation of patient's amiodarone.   -Cardiology following but have signed off as of 05/16/2023.  5.  SBO -Patient noted to have NG tube placed however NG tube noted to have been dislodged overnight. -Patient with bowel movement overnight per daughter.  -Patient states some improvement with abdominal pain. -CT abdomen and pelvis done on admission with distended fluid-filled stomach and small bowel into the pelvis.  Distal small bowel decompressed.  Findings compatible with distal small bowel obstruction.  Large hiatal hernia, fluid-filled. -Abdominal films ordered with previously administered enteric contrast remains within nondilated colon.  No significant retained contrast within the small bowel. -NG tube noted to have been pulled out per patient on 05/11/2023. -Patient being followed by general surgery and being managed by general surgery.   -Patient on MiraLAX daily as well as received a Dulcolax suppository on 05/13/2023, per general surgery and having bowel movements. -Collect suppository ordered this morning per general surgery.  -Diet advanced and patient tolerating soft diet. -Keep potassium approximately 4, magnesium approximately 2. -Per general surgery.  6.  Hypokalemia/hypomagnesemia/hypophosphatemia -Patient was on IV Lasix which was held on 05/14/2023. -Patient received a dose of IV Lasix this morning. -Potassium of 4.4, magnesium at 2.0, phosphorus at 3.0.   -Repeat labs in the AM.   7.  Thrombocytopenia -Improving. -Patient with no overt bleeding.  8.   History of rheumatoid arthritis -Outpatient follow-up.  9.  Hypothyroidism -Continue Synthroid.    10. Bipolar disorder/depression -Continue home regimen Effexor and Rexulti..   11.  GERD -PPI.  12.  Pressure injury, POA Pressure Injury 05/09/23 Coccyx Stage 1 -  Intact skin with non-blanchable redness of a localized area usually over a bony prominence. (Active)  05/09/23 0930  Location: Coccyx  Location Orientation:   Staging: Stage 1 -  Intact skin with non-blanchable redness of a localized area usually over a bony prominence.  Wound Description (Comments):   Present on Admission: Yes         DVT prophylaxis: Eliquis Code Status: DNR with interventions Family Communication: Updated patient and daughter and son-in-law at bedside. Disposition: SNF hopefully in the next 24 to 48 hours.  Status is: Inpatient Remains inpatient appropriate because: Severity of illness   Consultants:  Admitted to PCCM General Surgery: Dr.Tsuei 05/09/2023 Cardiology: Dr. Antoine Poche 05/12/2023  Procedures:  CT abdomen and pelvis 05/08/2023 Chest x-ray 05/11/2022 Abdominal films 05/09/2023, 05/11/2023, 05/12/2023. 2D echo 05/12/2023 PICC line 05/15/2023  Significant Hospital Events: Including procedures, antibiotic start and stop dates in addition to other pertinent events   2/25 admitted to ICU, was intubated for aspiration during NGT placement. Extubated in evening  2/26 improved pressures, going to try a small dose of metop for rate     Antimicrobials:  Anti-infectives (From admission, onward)    Start     Dose/Rate Route Frequency Ordered Stop   05/14/23  0800  cefTRIAXone (ROCEPHIN) 2 g in sodium chloride 0.9 % 100 mL IVPB        2 g 200 mL/hr over 30 Minutes Intravenous Every 24 hours 05/13/23 1005 05/15/23 0834   05/13/23 2200  metroNIDAZOLE (FLAGYL) IVPB 500 mg        500 mg 100 mL/hr over 60 Minutes Intravenous Every 12 hours 05/13/23 1005 05/15/23 0940   05/09/23 1000  cefTRIAXone  (ROCEPHIN) 2 g in sodium chloride 0.9 % 100 mL IVPB        2 g 200 mL/hr over 30 Minutes Intravenous Every 24 hours 05/09/23 0906 05/13/23 0923   05/09/23 1000  metroNIDAZOLE (FLAGYL) IVPB 500 mg  Status:  Discontinued        500 mg 100 mL/hr over 60 Minutes Intravenous Every 12 hours 05/09/23 0906 05/13/23 1005   05/09/23 0430  levofloxacin (LEVAQUIN) IVPB 750 mg        750 mg 100 mL/hr over 90 Minutes Intravenous  Once 05/09/23 0424 05/09/23 0602         Subjective: Patient sitting up in chair.  Daughter and son-in-law at bedside.  Feeling better.  Denies any chest pain.  Feels shortness of breath is improved.  No abdominal pain.  Noted to be having bowel movement yesterday and none today.  Tolerating oral intake.    Objective: Vitals:   05/16/23 1000 05/16/23 1213 05/16/23 1214 05/16/23 1217  BP:    129/81  Pulse: 93   88  Resp: 18   20  Temp:   98.1 F (36.7 C)   TempSrc:   Oral   SpO2: 94% 94%  96%  Weight:      Height:        Intake/Output Summary (Last 24 hours) at 05/16/2023 1544 Last data filed at 05/16/2023 1121 Gross per 24 hour  Intake 238 ml  Output 2401 ml  Net -2163 ml   Filed Weights   05/14/23 0455 05/15/23 0424 05/16/23 0005  Weight: 51.7 kg 48.6 kg 53.5 kg    Examination:  General exam: NAD. Respiratory system: Bibasilar crackles.  No wheezing.  Fair air movement.  Speaking in full sentences.  Cardiovascular system: RRR no murmurs rubs or gallops.  Trace bilateral lower extremity edema.  Gastrointestinal system: Abdomen distended, soft, nontender to palpation, positive bowel sounds.  No rebound.  No guarding.  Central nervous system: Alert and oriented.  Moving extremities spontaneously.  No focal neurological deficits. Extremities: Right upper extremity swelling improved.  Trace bilateral lower extremity edema. Skin: No rashes, lesions or ulcers Psychiatry: Judgement and insight appear fair. Mood & affect appropriate.     Data Reviewed: I have  personally reviewed following labs and imaging studies  CBC: Recent Labs  Lab 05/12/23 1151 05/13/23 0250 05/14/23 0320 05/15/23 0335 05/16/23 0520  WBC 15.0* 11.6* 12.9* 9.3 10.7*  NEUTROABS  --  8.5* 8.1* 7.0  --   HGB 13.6 11.5* 10.3* 8.8* 9.0*  HCT 41.0 33.4* 30.1* 26.3* 27.6*  MCV 96.0 94.4 95.3 96.3 97.9  PLT 107* 120* 127* 141* 171    Basic Metabolic Panel: Recent Labs  Lab 05/10/23 0528 05/11/23 0338 05/12/23 0438 05/13/23 0250 05/14/23 0320 05/15/23 0335 05/16/23 0520  NA 136   < > 139 134* 136  136 139 137  K 4.2   < > 3.4* 3.0* 3.0*  3.1* 3.6 4.4  CL 107   < > 105 96* 101  98 104 105  CO2 17*   < >  19* 21* 23  23 25 24   GLUCOSE 94   < > 99 240* 110*  107* 93 95  BUN 18   < > 10 10 14  14  7* 5*  CREATININE 1.12*   < > 0.78 0.75 0.70  0.71 0.66 0.69  CALCIUM 7.9*   < > 7.6* 7.3* 7.3*  7.7* 7.8* 8.3*  MG 2.2  --  2.0 1.5* 2.1 1.7 2.0  PHOS 2.7  --   --   --  1.2* 2.0* 3.0   < > = values in this interval not displayed.    GFR: Estimated Creatinine Clearance: 34.9 mL/min (by C-G formula based on SCr of 0.69 mg/dL).  Liver Function Tests: Recent Labs  Lab 05/13/23 0250 05/14/23 0320 05/15/23 0335 05/16/23 0520  ALBUMIN 2.2* 2.2* 3.2* 3.0*    CBG: Recent Labs  Lab 05/15/23 2013 05/16/23 0004 05/16/23 0406 05/16/23 0824 05/16/23 1217  GLUCAP 105* 81 98 142* 112*     Recent Results (from the past 240 hours)  MRSA Next Gen by PCR, Nasal     Status: None   Collection Time: 05/09/23  8:52 AM   Specimen: Nasal Mucosa; Nasal Swab  Result Value Ref Range Status   MRSA by PCR Next Gen NOT DETECTED NOT DETECTED Final    Comment: (NOTE) The GeneXpert MRSA Assay (FDA approved for NASAL specimens only), is one component of a comprehensive MRSA colonization surveillance program. It is not intended to diagnose MRSA infection nor to guide or monitor treatment for MRSA infections. Test performance is not FDA approved in patients less than 83  years old. Performed at [facility], [address], [place], [place] [address]   Resp panel by RT-PCR (RSV, Flu A&B, Covid) Anterior Nasal Swab     Status: None   Collection Time: 05/13/23 12:58 AM   Specimen: Anterior Nasal Swab  Result Value Ref Range Status   SARS Coronavirus 2 by RT PCR NEGATIVE NEGATIVE Final   Influenza A by PCR NEGATIVE NEGATIVE Final   Influenza B by PCR NEGATIVE NEGATIVE Final    Comment: (NOTE) The Xpert Xpress SARS-CoV-2/FLU/RSV plus assay is intended as an aid in the diagnosis of influenza from Nasopharyngeal swab specimens and should not be used as a sole basis for treatment. Nasal washings and aspirates are unacceptable for Xpert Xpress SARS-CoV-2/FLU/RSV testing.  Fact Sheet for Patients: BloggerCourse.com  Fact Sheet for Healthcare Providers: SeriousBroker.it  This test is not yet approved or cleared by the [place] FDA and has been authorized for detection and/or diagnosis of SARS-CoV-2 by FDA under an Emergency Use Authorization (EUA). This EUA will remain in effect (meaning this test can be used) for the duration of the COVID-19 declaration under Section 564(b)(1) of the Act, 21 U.S.C. section 360bbb-3(b)(1), unless the authorization is terminated or revoked.     Resp Syncytial Virus by PCR NEGATIVE NEGATIVE Final    Comment: (NOTE) Fact Sheet for Patients: BloggerCourse.com  Fact Sheet for Healthcare Providers: SeriousBroker.it  This test is not yet approved or cleared by the [place] FDA and has been authorized for detection and/or diagnosis of SARS-CoV-2 by FDA under an Emergency Use Authorization (EUA). This EUA will remain in effect (meaning this test can be used) for the duration of the COVID-19 declaration under Section 564(b)(1) of the Act, 21 U.S.C. section 360bbb-3(b)(1), unless the authorization is terminated  or revoked.  Performed at [facility], [address] [place], [place] [address]  Culture, blood (Routine X 2) w Reflex to ID Panel     Status: None (Preliminary result)   Collection Time: 05/13/23  2:50 AM   Specimen: BLOOD LEFT ARM  Result Value Ref Range Status   Specimen Description BLOOD LEFT ARM  Final   Special Requests   Final    BOTTLES DRAWN AEROBIC AND ANAEROBIC Blood Culture adequate volume   Culture   Final    NO GROWTH 3 DAYS Performed at [facility], [address] [place], [place] [address]    Report Status PENDING  Incomplete  Culture, blood (Routine X 2) w Reflex to ID Panel     Status: None (Preliminary result)   Collection Time: 05/13/23  2:50 AM   Specimen: BLOOD RIGHT HAND  Result Value Ref Range Status   Specimen Description BLOOD RIGHT HAND  Final   Special Requests AEROBIC BOTTLE ONLY Blood Culture adequate volume  Final   Culture   Final    NO GROWTH 3 DAYS Performed at [facility], [address], [place], [place] [address]    Report Status PENDING  Incomplete         Radiology Studies: [place] EKG SITE RITE Result Date: 05/15/2023 If Site Rite image not attached, placement could not be confirmed due to current cardiac rhythm.       Scheduled Meds:  amiodarone  200 mg Oral BID   apixaban  2.5 mg Oral BID   benztropine  0.5 mg Oral QHS   brexpiprazole  1 mg Oral QHS   Chlorhexidine Gluconate Cloth  6 each Topical Daily   docusate sodium  100 mg Oral BID   feeding supplement  237 mL Oral BID BM   insulin aspart  0-9 Units Subcutaneous Q4H   levothyroxine  88 mcg Oral Q0600   metoprolol succinate  50 mg Oral Daily   metoprolol tartrate  25 mg Oral QHS   mouth rinse  15 mL Mouth Rinse 4 times per day   pantoprazole  40 mg Oral QAC supper   polyethylene glycol  17 g Oral Daily   simethicone  80 mg Oral QID   sodium chloride flush  10-40 mL Intracatheter Q12H   venlafaxine XR  150 mg Oral q AM   Continuous  Infusions:     LOS: 7 days    Time spent: 40 minutes    Ramiro Harvest, MD [facility]   To contact the attending provider between 7A-7P or the covering provider during after hours 7P-7A, please log into the web site [email or web address] and access using universal [place] password for that web site. If you do not have the password, please call the hospital operator.  05/16/2023, 3:44 PM

## 2023-05-16 NOTE — TOC Progression Note (Addendum)
 Transition of Care [facility]) - Progression Note    Patient Details  Name: Erica [name] MRN: [id number] Date of Birth: [birth year]  [facility] CM/SW Contact  Michaela Corner, [place] Phone Number: 05/16/2023, 11:39 AM  Clinical Narrative:   CSW left VM for [facility] admissions at this time.   CSW spoke with Luisa Hart (pts son) about plan for discharge. Luisa Hart states he has a been at [facility] for patient and would like his mother to discharge back there for STR.   12:42 PM CSW spoke with [facility] admissions (Ciara) about Luisa Hart wanting pt to go to their facility for STR. Charlynne Cousins is aware and wanted to double check Patrick's plan for when patient discharges from STR. Per Glean Hess stated he would like patient to discharge from [facility] to another STR facility to utilize her medicare ins (as [facility] is private pay). CSW called Luisa Hart to discuss what discharging from facility to facility may entail as insurance will less likely approve. Luisa Hart stated he understood and will continue to pay [facility] if patient needs additional time at [facility].   TOC will continue to follow.   Expected Discharge Plan: Skilled Nursing Facility Barriers to Discharge: Continued Medical Work up  Expected Discharge Plan and Services In-house Referral: Clinical Social Work     Living arrangements for the past 2 months: Skilled Nursing Facility                                       Social Determinants of Health (SDOH) Interventions SDOH Screenings   Food Insecurity: No Food Insecurity (05/14/2023)  Housing: Low Risk  (05/14/2023)  Transportation Needs: Unknown (05/14/2023)  Utilities: Not At Risk (05/14/2023)  Financial Resource Strain: Low Risk  (02/13/2020)   Received from [facility] [place], [facility]  Social Connections: Unknown (05/14/2023)  Stress: Stress Concern Present (02/13/2020)   Received from [facility], [facility]  Tobacco Use: Medium Risk (05/09/2023)     Readmission Risk Interventions    05/09/2023    4:27 PM  Readmission Risk Prevention Plan  Transportation Screening Complete  PCP or Specialist Appt within 5-7 Days Complete  Home Care Screening Complete  Medication Review (RN CM) Referral to Pharmacy

## 2023-05-16 NOTE — Progress Notes (Deleted)
 Please be advised that the above-named patient will require a short-term nursing home stay-anticipated 30 days or less for rehabilitation and strengthening. The plan is for return home.

## 2023-05-16 NOTE — [place] FL2 (Signed)
 [place] MEDICAID FL2 LEVEL OF CARE FORM     IDENTIFICATION  Patient Name: Erica [name] Birthdate: Nov 14, 1932 Sex: female Admission Date (Current Location): 05/08/2023  [facility] and [place] Number:  Producer, television/film/video and Address:  [facility], [address] [place], [place] [address]      Provider Number: [id number]  Attending Physician Name and Address:  Rodolph Bong, MD  Relative Name and Phone Number:       Current Level of Care: Hospital Recommended Level of Care: Skilled Nursing Facility Prior Approval Number:    Date Approved/Denied:   PASRR Number: Pending  Discharge Plan: SNF    Current Diagnoses: Patient Active Problem List   Diagnosis Date Noted   Hypophosphatemia 05/14/2023   Acute respiratory failure with hypoxia (HCC) 05/12/2023   Aspiration pneumonia (HCC) 05/12/2023   Acute diastolic CHF (congestive heart failure) (HCC) 05/12/2023   Atrial fib/flutter, transient (HCC) 05/11/2023   SBO (small bowel obstruction) (HCC) 05/11/2023   Pressure injury of skin 05/11/2023   Obstruction of bowel (HCC) 05/09/2023   Diarrhea 09/22/2020   Recurrent UTI 05/25/2020   Nephrolithiasis 05/25/2020   E coli bacteremia 05/03/2020   Sepsis secondary to UTI (HCC) 05/02/2020   Hyperlipidemia    Hypokalemia    Macrocytic anemia    Protein-calorie malnutrition, mild (HCC)    Acute cystitis without hematuria    Nausea without vomiting 01/07/2020   Bloating 01/28/2019   Boil of groin 12/31/2018   Constipation in female 06/22/2017   Fall 05/07/2017   Right rib fracture 05/07/2017   AKI (acute kidney injury) (HCC) 05/04/2017   Hyponatremia 05/03/2017   Bipolar disorder with severe depression (HCC) 11/23/2016   Hypertension 11/23/2016   Rheumatoid arthritis involving both hands with positive rheumatoid factor (HCC) 06/01/2016   Partial small bowel obstruction (HCC) 03/12/2015   Depression 03/12/2015   Hypotension 03/12/2015    Lactic acidosis 03/12/2015   Dehydration 03/12/2015   Small bowel obstruction (HCC) 03/12/2015   Hypothyroidism 02/21/2014   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   Major depressive disorder, recurrent episode with anxious distress (HCC) 02/13/2014   Tachycardia 08/24/2013   S/P arthroscopy of shoulder 08/14/2013   Muscle weakness (generalized) 06/14/2013   Rotator cuff syndrome of right shoulder 12/05/2012   Right shoulder pain 12/05/2012   Rectal bleeding 06/06/2011   Epigastric pain 04/19/2011   GERD (gastroesophageal reflux disease) 04/19/2011   IBS (irritable bowel syndrome) 04/19/2011   METATARSALGIA 05/12/2009   BUNION 05/12/2009    Orientation RESPIRATION BLADDER Height & Weight     Time, Situation, Place  O2 (2L [place]) Incontinent, External catheter Weight: 117 lb 15.1 oz (53.5 kg) Height:  5' (152.4 cm)  BEHAVIORAL SYMPTOMS/MOOD NEUROLOGICAL BOWEL NUTRITION STATUS      Continent Diet (see dc summary)  AMBULATORY STATUS COMMUNICATION OF NEEDS Skin   Extensive Assist Verbally PU Stage and Appropriate Care (Pressure Injury - Coccyx Stage 1)                       Personal Care Assistance Level of Assistance  Bathing, Feeding, Dressing Bathing Assistance: Maximum assistance Feeding assistance: Limited assistance Dressing Assistance: Maximum assistance     Functional Limitations Info  Sight, Hearing, Speech Sight Info: Adequate Hearing Info: Impaired (Hearing impaired) Speech Info: Adequate    SPECIAL CARE FACTORS FREQUENCY  PT (By licensed PT), OT (By licensed OT)     PT Frequency: 5x week OT Frequency: 5x week  Contractures Contractures Info: Not present    Additional Factors Info  Code Status, Allergies, Psychotropic, Insulin Sliding Scale Code Status Info: DNR Interven Allergies Info: Penicillins, Hydrocodone, Morphine And Codeine, Morphine Sulfate, Sulfamethoxazole-trimethoprim Psychotropic Info: Cogentin Insulin Sliding Scale Info: see dc  summary       Current Medications (05/16/2023):  This is the current hospital active medication list Current Facility-Administered Medications  Medication Dose Route Frequency Provider Last Rate Last Admin   amiodarone (PACERONE) tablet 200 mg  200 mg Oral BID Chrystie Nose, MD   200 mg at 05/16/23 6962   apixaban (ELIQUIS) tablet 2.5 mg  2.5 mg Oral BID Chrystie Nose, MD   2.5 mg at 05/16/23 9528   benztropine (COGENTIN) tablet 0.5 mg  0.5 mg Oral QHS Rodolph Bong, MD   0.5 mg at 05/15/23 2109   brexpiprazole (REXULTI) tablet 1 mg  1 mg Oral QHS Rodolph Bong, MD   1 mg at 05/15/23 2108   Chlorhexidine Gluconate Cloth 2 % PADS 6 each  6 each Topical Daily Rodolph Bong, MD   6 each at 05/16/23 0854   docusate sodium (COLACE) capsule 100 mg  100 mg Oral BID Eric Form, PA-C   100 mg at 05/16/23 4132   feeding supplement (ENSURE ENLIVE / ENSURE PLUS) liquid 237 mL  237 mL Oral BID BM Eric Form, PA-C   237 mL at 05/15/23 4401   insulin aspart (novoLOG) injection 0-9 Units  0-9 Units Subcutaneous Q4H Lynnell Catalan, MD   1 Units at 05/16/23 0850   levothyroxine (SYNTHROID) tablet 88 mcg  88 mcg Oral Q0600 Rodolph Bong, MD   88 mcg at 05/16/23 0536   LORazepam (ATIVAN) tablet 0.5 mg  0.5 mg Oral BID PRN Rodolph Bong, MD   0.5 mg at 05/15/23 2109   melatonin tablet 6 mg  6 mg Oral QHS PRN Dolly Rias, MD       metoprolol succinate (TOPROL-XL) 24 hr tablet 50 mg  50 mg Oral Daily Jake Bathe, MD       metoprolol tartrate (LOPRESSOR) tablet 25 mg  25 mg Oral QHS Jake Bathe, MD       Oral care mouth rinse  15 mL Mouth Rinse 4 times per day Lynnell Catalan, MD   15 mL at 05/16/23 0843   Oral care mouth rinse  15 mL Mouth Rinse PRN Agarwala, Daleen Bo, MD       pantoprazole (PROTONIX) EC tablet 40 mg  40 mg Oral QAC supper Rodolph Bong, MD   40 mg at 05/15/23 1751   polyethylene glycol (MIRALAX / GLYCOLAX) packet 17 g  17 g Oral Daily Eric Form, PA-C   17 g at 05/16/23 0841   prochlorperazine (COMPAZINE) injection 5 mg  5 mg Intravenous Q4H PRN Gery Pray, MD   5 mg at 05/09/23 0158   simethicone (MYLICON) chewable tablet 80 mg  80 mg Oral QID Rodolph Bong, MD       sodium chloride flush (NS) 0.9 % injection 10-40 mL  10-40 mL Intracatheter Q12H Rodolph Bong, MD   10 mL at 05/16/23 0843   sodium chloride flush (NS) 0.9 % injection 10-40 mL  10-40 mL Intracatheter PRN Rodolph Bong, MD       venlafaxine XR (EFFEXOR-XR) 24 hr capsule 150 mg  150 mg Oral q AM Rodolph Bong, MD   150 mg at 05/16/23 [phone]  Discharge Medications: Please see discharge summary for a list of discharge medications.  Relevant Imaging Results:  Relevant Lab Results:   Additional Information SSN [id number]  Michaela Corner, [place]

## 2023-05-16 NOTE — Progress Notes (Signed)
   Subjective/Chief Complaint: Tol diet, appetite not normal, having flatus no bm   Objective: Vital signs in last 24 hours: Temp:  [97.6 F (36.4 C)-98.2 F (36.8 C)] 97.6 F (36.4 C) (03/04 0730) Pulse Rate:  [92-106] 100 (03/04 0730) Resp:  [16-21] 16 (03/04 0730) BP: (114-152)/(56-82) 152/73 (03/04 0730) SpO2:  [92 %-96 %] 92 % (03/04 0730) Weight:  [53.5 kg] 53.5 kg (03/04 0005) Last BM Date : 05/14/23  Intake/Output from previous day: 03/03 0701 - 03/04 0700 In: 831.3 [P.O.:120; IV Piggyback:711.3] Out: 1502 [Urine:1501; Stool:1] Intake/Output this shift: Total I/O In: 118 [P.O.:118] Out: -   Ab soft nontender nondistended  Lab Results:  Recent Labs    05/15/23 0335 05/16/23 0520  WBC 9.3 10.7*  HGB 8.8* 9.0*  HCT 26.3* 27.6*  PLT 141* 171   BMET Recent Labs    05/15/23 0335 05/16/23 0520  NA 139 137  K 3.6 4.4  CL 104 105  CO2 25 24  GLUCOSE 93 95  BUN 7* 5*  CREATININE 0.66 0.69  CALCIUM 7.8* 8.3*   PT/INR No results for input(s): "LABPROT", "INR" in the last 72 hours. ABG No results for input(s): "PHART", "HCO3" in the last 72 hours.  Invalid input(s): "PCO2", "PO2"  Studies/Results: [place] EKG SITE RITE Result Date: 05/15/2023 If Site Rite image not attached, placement could not be confirmed due to current cardiac rhythm.  [place] EKG SITE RITE Result Date: 05/14/2023 If Site Rite image not attached, placement could not be confirmed due to current cardiac rhythm.   Anti-infectives: Anti-infectives (From admission, onward)    Start     Dose/Rate Route Frequency Ordered Stop   05/14/23 0800  cefTRIAXone (ROCEPHIN) 2 g in sodium chloride 0.9 % 100 mL IVPB        2 g 200 mL/hr over 30 Minutes Intravenous Every 24 hours 05/13/23 1005 05/15/23 0834   05/13/23 2200  metroNIDAZOLE (FLAGYL) IVPB 500 mg        500 mg 100 mL/hr over 60 Minutes Intravenous Every 12 hours 05/13/23 1005 05/15/23 0940   05/09/23 1000  cefTRIAXone (ROCEPHIN) 2 g in  sodium chloride 0.9 % 100 mL IVPB        2 g 200 mL/hr over 30 Minutes Intravenous Every 24 hours 05/09/23 0906 05/13/23 0923   05/09/23 1000  metroNIDAZOLE (FLAGYL) IVPB 500 mg  Status:  Discontinued        500 mg 100 mL/hr over 60 Minutes Intravenous Every 12 hours 05/09/23 0906 05/13/23 1005   05/09/23 0430  levofloxacin (LEVAQUIN) IVPB 750 mg        750 mg 100 mL/hr over 90 Minutes Intravenous  Once 05/09/23 0424 05/09/23 0602       Assessment/Plan: SBO - partial - CT w/ Distended fluid-filled stomach and small bowel into the pelvis. Distal small bowel decompressed. Findings compatible with distal small bowel obstruction. - has resolved clinically and radiologically, she remains ill overall -can have soft diet -continue bowel regimen -she can do suppository today -will sign off please call with questions     FEN: soft diet IVF per primary ID: rocephin/flagyl (aspiration) VTE: hep gtt   Per primary: Acute onset afib RVR - on amio drip HLD IBS RA BPD H/o abdominal hysterectomy, h/o colostomy s/p reversal    Erica [name] 05/16/2023

## 2023-05-16 NOTE — Progress Notes (Signed)
 Mobility Specialist Progress Note:   05/16/23 1015  Mobility  Activity Transferred to/from BSC (to [facility])  Level of Assistance Moderate assist, patient does 50-74%  Assistive Device Front wheel walker  Distance Ambulated (ft) 3 ft  Activity Response Tolerated well  Mobility Referral Yes  Mobility visit 1 Mobility  Mobility Specialist Start Time (ACUTE ONLY) 1002  Mobility Specialist Stop Time (ACUTE ONLY) 1015  Mobility Specialist Time Calculation (min) (ACUTE ONLY) 13 min   Pt received in chair requesting to go back to bed. Pt required ModA to stand. Upon standing pt requested to use BSC. C/o buttock pain, otherwise no c/o. NT assisted in getting [facility] behind patient. Call bell and personal belongings in reach. Left w/ NT in room.  Thompson Grayer Mobility Specialist  Please contact vis Secure Chat or  Rehab Office [phone]

## 2023-05-16 NOTE — Progress Notes (Signed)
 Occupational Therapy Treatment Patient Details Name: Erica [name] MRN: [id number] DOB: 01-Apr-1932 Today's Date: 05/16/2023   History of present illness Pt is 89 year old presented to [facility] on  05/08/23 for nausea, vomiting, diarrhea. Pt found to have partial SBO. Difficulty placing NGT led to aspiration and pt intubated and transferred to [facility]. Pt extubated 05/09/23. Pt developed afib with rvr. PMH - rheumatoid arthritis, bipolar   OT comments  Patient received in supine and agreeable to OOB with OT. Patient required increased time and mod assist to get to EOB. Patient was able to transfer to recliner with cues for safety and min assist 1 person HHA. Patient stating difficulty with grooming tasks and self feeding but was able to perform with encouragement. Patient will benefit from continued inpatient follow up therapy, <3 hours/day. Acute OT to continue to follow to address established goals to facilitate DC to next venue of care.       If plan is discharge home, recommend the following:  A lot of help with bathing/dressing/bathroom;A little help with walking and/or transfers   Equipment Recommendations  Other (comment) (TBD)    Recommendations for Other Services      Precautions / Restrictions Precautions Precautions: Fall;Other (comment) Recall of Precautions/Restrictions: Impaired Restrictions Weight Bearing Restrictions Per Provider Order: No       Mobility Bed Mobility Overal bed mobility: Needs Assistance Bed Mobility: Supine to Sit     Supine to sit: Mod assist, HOB elevated     General bed mobility comments: increased time and assistance with trunk and scooting to EOB    Transfers Overall transfer level: Needs assistance Equipment used: 1 person hand held assist Transfers: Sit to/from Stand, Bed to chair/wheelchair/BSC Sit to Stand: Min assist     Step pivot transfers: Min assist     General transfer comment: patient demonstrating posterior leaning  and cues for safety     Balance Overall balance assessment: Needs assistance Sitting-balance support: No upper extremity supported, Feet supported Sitting balance-Leahy Scale: Good Sitting balance - Comments: EOB   Standing balance support: Single extremity supported, During functional activity Standing balance-Leahy Scale: Poor Standing balance comment: reliant on therapist for support                           ADL either performed or assessed with clinical judgement   ADL Overall ADL's : Needs assistance/impaired Eating/Feeding: Set up;Sitting Eating/Feeding Details (indicate cue type and reason): requires encouragement to engage in self feeding Grooming: Sitting;Moderate assistance;Wash/dry hands;Wash/dry face Grooming Details (indicate cue type and reason): able to wash face and hands             Lower Body Dressing: Total assistance;Sit to/from stand                 General ADL Comments: cues for encouragement    Extremity/Trunk Assessment Upper Extremity Assessment Upper Extremity Assessment: Generalized weakness            Vision       Perception     Praxis     Communication Communication Communication: Impaired Factors Affecting Communication: Hearing impaired   Cognition Arousal: Alert Behavior During Therapy: WFL for tasks assessed/performed Cognition: Cognition impaired   Orientation impairments: Situation, Time Awareness: Intellectual awareness impaired, Online awareness impaired Memory impairment (select all impairments): Working [employer] fast streamer, Conservation officer, historic buildings Attention impairment (select first level of impairment): Sustained attention Executive functioning impairment (select all impairments): Organization, Sequencing, Reasoning, Problem  solving OT - Cognition Comments: pleasant but required encouragement to perform grooming tasks and self feeding                 Following commands: Intact Following commands  impaired: Follows one step commands with increased time      Cueing   Cueing Techniques: Verbal cues, Tactile cues  Exercises      Shoulder Instructions       General Comments SpO2 in 90's on 2 liters    Pertinent Vitals/ Pain       Pain Assessment Pain Assessment: Faces Faces Pain Scale: Hurts a little bit Pain Location: R knee Pain Descriptors / Indicators: Aching, Grimacing, Guarding Pain Intervention(s): Monitored during session, Repositioned  Home Living                                          Prior Functioning/Environment              Frequency  Min 1X/week        Progress Toward Goals  OT Goals(current goals can now be found in the care plan section)  Progress towards OT goals: Progressing toward goals  Acute Rehab OT Goals Patient Stated Goal: get better OT Goal Formulation: With patient Time For Goal Achievement: 05/25/23 Potential to Achieve Goals: Good ADL Goals Pt Will Perform Grooming: with min assist;standing Pt Will Perform Lower Body Bathing: with min assist;sit to/from stand;sitting/lateral leans Pt Will Perform Lower Body Dressing: with mod assist;sitting/lateral leans;sit to/from stand Pt Will Transfer to Toilet: with min assist;ambulating  Plan      Co-evaluation                 AM-PAC OT "6 Clicks" Daily Activity     Outcome Measure   Help from another person eating meals?: A Little Help from another person taking care of personal grooming?: A Lot Help from another person toileting, which includes using toliet, bedpan, or urinal?: Total Help from another person bathing (including washing, rinsing, drying)?: A Lot Help from another person to put on and taking off regular upper body clothing?: A Lot Help from another person to put on and taking off regular lower body clothing?: Total 6 Click Score: 11    End of Session Equipment Utilized During Treatment: Gait belt;Oxygen  OT Visit Diagnosis:  Unsteadiness on feet (R26.81);Other abnormalities of gait and mobility (R26.89);Muscle weakness (generalized) (M62.81)   Activity Tolerance Patient tolerated treatment well   Patient Left in chair;with call bell/phone within reach;with chair alarm set   Nurse Communication Mobility status        Time: 9147-8295 OT Time Calculation (min): 22 min  Charges: OT General Charges $OT Visit: 1 Visit OT Treatments $Self Care/Home Management : 8-22 mins  Alfonse Flavors, OTA Acute Rehabilitation Services  Office [phone]   Dewain Penning 05/16/2023, 9:31 AM

## 2023-05-17 DIAGNOSIS — J9601 Acute respiratory failure with hypoxia: Secondary | ICD-10-CM | POA: Diagnosis not present

## 2023-05-17 LAB — CBC
HCT: 28.8 % — ABNORMAL LOW (ref 36.0–46.0)
Hemoglobin: 9.6 g/dL — ABNORMAL LOW (ref 12.0–15.0)
MCH: 32.4 pg (ref 26.0–34.0)
MCHC: 33.3 g/dL (ref 30.0–36.0)
MCV: 97.3 fL (ref 80.0–100.0)
Platelets: 190 10*3/uL (ref 150–400)
RBC: 2.96 MIL/uL — ABNORMAL LOW (ref 3.87–5.11)
RDW: 13.9 % (ref 11.5–15.5)
WBC: 11.8 10*3/uL — ABNORMAL HIGH (ref 4.0–10.5)
nRBC: 0 % (ref 0.0–0.2)

## 2023-05-17 LAB — RENAL FUNCTION PANEL
Albumin: 3 g/dL — ABNORMAL LOW (ref 3.5–5.0)
Anion gap: 10 (ref 5–15)
BUN: 10 mg/dL (ref 8–23)
CO2: 27 mmol/L (ref 22–32)
Calcium: 8.7 mg/dL — ABNORMAL LOW (ref 8.9–10.3)
Chloride: 101 mmol/L (ref 98–111)
Creatinine, Ser: 0.79 mg/dL (ref 0.44–1.00)
GFR, Estimated: >60 mL/min (ref >60)
Glucose, Bld: 119 mg/dL — ABNORMAL HIGH (ref 70–99)
Phosphorus: 3.6 mg/dL (ref 2.5–4.6)
Potassium: 4.2 mmol/L (ref 3.5–5.1)
Sodium: 138 mmol/L (ref 135–145)

## 2023-05-17 LAB — GLUCOSE, CAPILLARY
Glucose-Capillary: 108 mg/dL — ABNORMAL HIGH (ref 70–99)
Glucose-Capillary: 149 mg/dL — ABNORMAL HIGH (ref 70–99)
Glucose-Capillary: 92 mg/dL (ref 70–99)
Glucose-Capillary: 112 mg/dL — ABNORMAL HIGH (ref 70–99)
Glucose-Capillary: 108 mg/dL — ABNORMAL HIGH (ref 70–99)

## 2023-05-17 LAB — MAGNESIUM: Magnesium: 1.9 mg/dL (ref 1.7–2.4)

## 2023-05-17 NOTE — Progress Notes (Signed)
 PROGRESS NOTE    Erica [name]  UJW:[id number] DOB: 1932-12-17 DOA: 05/08/2023 PCP: Aliene Beams, MD   Brief Narrative:  89 year old female with history of bowel obstruction, hypertension, rheumatoid arthritis, bipolar disorder, hypothyroidism, hyperlipidemia presented with nausea, vomiting.  She was found to have SBO.  She had difficulty passing NGT which led to an aspiration event for which she was intubated in the ED.  She was admitted to ICU under PCCM service.  General surgery was consulted.  She was started on broad-spectrum antibiotics.  She was subsequently extubated on 05/09/2023.  Hospital course complicated by A-fib with RVR requiring cardiology consultation.  She was started on amiodarone and heparin drips.  She was transferred to [facility] service from 05/12/2023 onwards.  Subsequently, her condition has improved.  NG tube has been removed and she is currently tolerating diet and having bowel function.  Subsequently, she has been switched to oral amiodarone and Eliquis.  Cardiology and general surgery signed off on 05/16/2023.  PT recommending SNF placement.  Assessment & Plan:   Acute hypoxic and hypercarbic respiratory failure Possible aspiration pneumonia -Secondary to aspiration and CHF exacerbation -Needed intubation on presentation.  Extubated on 05/09/2023.  Currently still requiring 1 to 2 L oxygen via nasal cannula intermittently.  Wean off as able. -Care transferred to [facility] service from 05/12/2023 onwards. -Patient received 7 days of IV Rocephin and Flagyl. -Also received intermittent IV Lasix  Acute diastolic CHF -Continue strict input and output.  Daily weights.  Fluid restriction. -2D echo showed EF of 70 to 75% with grade 1 diastolic dysfunction -Received IV Lasix intermittently during the hospitalization along with IV albumin.  Currently on metoprolol succinate.  Cardiology signed off on 05/16/2023.  Outpatient follow-up with cardiology  Paroxysmal A-fib with RVR -Treated  with heparin drip and amiodarone drips initially and subsequently transition to oral amiodarone and oral Eliquis.  Currently rate controlled.  Continue metoprolol succinate.  Outpatient follow-up with cardiology  SBO -Treated conservatively with NG tube/n.p.o.  Subsequently, NG tube has been removed -Resolved.  Currently tolerating diet and having bowel movements.  Continue daily MiraLAX.  General surgery signed off on 05/16/2023  Leukocytosis -Resolved  Thrombocytopenia -Resolved  Hypokalemia -Resolved  Hypophosphatemia -Resolved  Hypomagnesemia -Resolved  Hypoalbuminemia -Encourage oral intake  History of rheumatoid arthritis -Outpatient follow-up  Hypothyroidism Continue levothyroxine  Bipolar disorder/depression -Continue Effexor and Rexulti along with Cogentin  GERD -Continue PPI  Stage I coccygeal pressure ulcer: Present on admission -Continue local wound care  Physical deconditioning -PT recommending SNF placement.  TOC following.   DVT prophylaxis: Eliquis Code Status: DNR with interventions Family Communication: Daughter at bedside Disposition Plan: Status is: Inpatient Remains inpatient appropriate because: Of severity of illness.  Possible discharge tomorrow to SNF if remains stable   Consultants: PCCM/cardiology/general surgery  Procedures: As above  Antimicrobials:  Anti-infectives (From admission, onward)    Start     Dose/Rate Route Frequency Ordered Stop   05/14/23 0800  cefTRIAXone (ROCEPHIN) 2 g in sodium chloride 0.9 % 100 mL IVPB        2 g 200 mL/hr over 30 Minutes Intravenous Every 24 hours 05/13/23 1005 05/15/23 0834   05/13/23 2200  metroNIDAZOLE (FLAGYL) IVPB 500 mg        500 mg 100 mL/hr over 60 Minutes Intravenous Every 12 hours 05/13/23 1005 05/15/23 0940   05/09/23 1000  cefTRIAXone (ROCEPHIN) 2 g in sodium chloride 0.9 % 100 mL IVPB        2 g 200 mL/hr  over 30 Minutes Intravenous Every 24 hours 05/09/23 0906 05/13/23  0923   05/09/23 1000  metroNIDAZOLE (FLAGYL) IVPB 500 mg  Status:  Discontinued        500 mg 100 mL/hr over 60 Minutes Intravenous Every 12 hours 05/09/23 0906 05/13/23 1005   05/09/23 0430  levofloxacin (LEVAQUIN) IVPB 750 mg        750 mg 100 mL/hr over 90 Minutes Intravenous  Once 05/09/23 0424 05/09/23 0602        Subjective: Patient seen and examined at bedside.  Feels weak, has not walked much.  Denies worsening abdominal pain, fever or vomiting.  Having bowel movement.  Objective: Vitals:   05/17/23 0221 05/17/23 0449 05/17/23 0454 05/17/23 0804  BP:  127/64  (!) 115/50  Pulse: 90 94  94  Resp: 14 15  16   Temp:  98 F (36.7 C)    TempSrc:  Oral    SpO2: 93% 94%  94%  Weight:   50 kg   Height:        Intake/Output Summary (Last 24 hours) at 05/17/2023 1108 Last data filed at 05/17/2023 0821 Gross per 24 hour  Intake 240 ml  Output 900 ml  Net -660 ml   Filed Weights   05/15/23 0424 05/16/23 0005 05/17/23 0454  Weight: 48.6 kg 53.5 kg 50 kg    Examination:  General exam: Appears calm and comfortable.  On 1 to 2 L oxygen via nasal cannula intermittently.  Elderly female sitting on chair. Respiratory system: Bilateral decreased breath sounds at bases with scattered crackles Cardiovascular system: S1 & S2 heard, Rate controlled currently Gastrointestinal system: Abdomen is nondistended, soft and nontender. Normal bowel sounds heard. Extremities: No cyanosis, clubbing; mild lower extremity edema Central nervous system: Alert and oriented.  Slow to respond.  No focal neurological deficits. Moving extremities Skin: No rashes, lesions or ulcers Psychiatry: Flat affect.  Not agitated.    Data Reviewed: I have personally reviewed following labs and imaging studies  CBC: Recent Labs  Lab 05/13/23 0250 05/14/23 0320 05/15/23 0335 05/16/23 0520 05/17/23 0525  WBC 11.6* 12.9* 9.3 10.7* 11.8*  NEUTROABS 8.5* 8.1* 7.0  --   --   HGB 11.5* 10.3* 8.8* 9.0* 9.6*   HCT 33.4* 30.1* 26.3* 27.6* 28.8*  MCV 94.4 95.3 96.3 97.9 97.3  PLT 120* 127* 141* 171 190   Basic Metabolic Panel: Recent Labs  Lab 05/13/23 0250 05/14/23 0320 05/15/23 0335 05/16/23 0520 05/17/23 0525  NA 134* 136  136 139 137 138  K 3.0* 3.0*  3.1* 3.6 4.4 4.2  CL 96* 101  98 104 105 101  CO2 21* 23  23 25 24 27   GLUCOSE 240* 110*  107* 93 95 119*  BUN 10 14  14  7* 5* 10  CREATININE 0.75 0.70  0.71 0.66 0.69 0.79  CALCIUM 7.3* 7.3*  7.7* 7.8* 8.3* 8.7*  MG 1.5* 2.1 1.7 2.0 1.9  PHOS  --  1.2* 2.0* 3.0 3.6   GFR: Estimated Creatinine Clearance: 34.9 mL/min (by C-G formula based on SCr of 0.79 mg/dL). Liver Function Tests: Recent Labs  Lab 05/13/23 0250 05/14/23 0320 05/15/23 0335 05/16/23 0520 05/17/23 0525  ALBUMIN 2.2* 2.2* 3.2* 3.0* 3.0*   No results for input(s): "LIPASE", "AMYLASE" in the last 168 hours. No results for input(s): "AMMONIA" in the last 168 hours. Coagulation Profile: No results for input(s): "INR", "PROTIME" in the last 168 hours. Cardiac Enzymes: No results for input(s): "CKTOTAL", "CKMB", "CKMBINDEX", "TROPONINI" in  the last 168 hours. BNP (last 3 results) No results for input(s): "PROBNP" in the last 8760 hours. HbA1C: No results for input(s): "HGBA1C" in the last 72 hours. CBG: Recent Labs  Lab 05/16/23 1647 05/16/23 2035 05/16/23 2356 05/17/23 0453 05/17/23 0820  GLUCAP 114* 107* 114* 108* 149*   Lipid Profile: No results for input(s): "CHOL", "HDL", "LDLCALC", "TRIG", "CHOLHDL", "LDLDIRECT" in the last 72 hours. Thyroid Function Tests: Recent Labs    05/16/23 0520  FREET4 0.70   Anemia Panel: Recent Labs    05/16/23 0520  VITAMINB12 1,401*  FOLATE >40.0  FERRITIN 300  TIBC 147*  IRON 27*   Sepsis Labs: No results for input(s): "PROCALCITON", "LATICACIDVEN" in the last 168 hours.  Recent Results (from the past 240 hours)  MRSA Next Gen by PCR, Nasal     Status: None   Collection Time: 05/09/23  8:52 AM    Specimen: Nasal Mucosa; Nasal Swab  Result Value Ref Range Status   MRSA by PCR Next Gen NOT DETECTED NOT DETECTED Final    Comment: (NOTE) The GeneXpert MRSA Assay (FDA approved for NASAL specimens only), is one component of a comprehensive MRSA colonization surveillance program. It is not intended to diagnose MRSA infection nor to guide or monitor treatment for MRSA infections. Test performance is not FDA approved in patients less than 5 years old. Performed at [facility], [address] [place], [place] [address]   Resp panel by RT-PCR (RSV, Flu A&B, Covid) Anterior Nasal Swab     Status: None   Collection Time: 05/13/23 12:58 AM   Specimen: Anterior Nasal Swab  Result Value Ref Range Status   SARS Coronavirus 2 by RT PCR NEGATIVE NEGATIVE Final   Influenza A by PCR NEGATIVE NEGATIVE Final   Influenza B by PCR NEGATIVE NEGATIVE Final    Comment: (NOTE) The Xpert Xpress SARS-CoV-2/FLU/RSV plus assay is intended as an aid in the diagnosis of influenza from Nasopharyngeal swab specimens and should not be used as a sole basis for treatment. Nasal washings and aspirates are unacceptable for Xpert Xpress SARS-CoV-2/FLU/RSV testing.  Fact Sheet for Patients: BloggerCourse.com  Fact Sheet for Healthcare Providers: SeriousBroker.it  This test is not yet approved or cleared by the [place] FDA and has been authorized for detection and/or diagnosis of SARS-CoV-2 by FDA under an Emergency Use Authorization (EUA). This EUA will remain in effect (meaning this test can be used) for the duration of the COVID-19 declaration under Section 564(b)(1) of the Act, 21 U.S.C. section 360bbb-3(b)(1), unless the authorization is terminated or revoked.     Resp Syncytial Virus by PCR NEGATIVE NEGATIVE Final    Comment: (NOTE) Fact Sheet for Patients: BloggerCourse.com  Fact Sheet for Healthcare  Providers: SeriousBroker.it  This test is not yet approved or cleared by the [place] FDA and has been authorized for detection and/or diagnosis of SARS-CoV-2 by FDA under an Emergency Use Authorization (EUA). This EUA will remain in effect (meaning this test can be used) for the duration of the COVID-19 declaration under Section 564(b)(1) of the Act, 21 U.S.C. section 360bbb-3(b)(1), unless the authorization is terminated or revoked.  Performed at [facility], [address], [place], [place] [address]   Culture, blood (Routine X 2) w Reflex to ID Panel     Status: None (Preliminary result)   Collection Time: 05/13/23  2:50 AM   Specimen: BLOOD LEFT ARM  Result Value Ref Range Status   Specimen Description BLOOD LEFT  ARM  Final   Special Requests   Final    BOTTLES DRAWN AEROBIC AND ANAEROBIC Blood Culture adequate volume   Culture   Final    NO GROWTH 4 DAYS Performed at [facility], [address], [place], [place] [address]    Report Status PENDING  Incomplete  Culture, blood (Routine X 2) w Reflex to ID Panel     Status: None (Preliminary result)   Collection Time: 05/13/23  2:50 AM   Specimen: BLOOD RIGHT HAND  Result Value Ref Range Status   Specimen Description BLOOD RIGHT HAND  Final   Special Requests AEROBIC BOTTLE ONLY Blood Culture adequate volume  Final   Culture   Final    NO GROWTH 4 DAYS Performed at [facility], [address] [place], [place] [address]    Report Status PENDING  Incomplete         Radiology Studies: No results found.      Scheduled Meds:  amiodarone  200 mg Oral BID   apixaban  2.5 mg Oral BID   benztropine  0.5 mg Oral QHS   brexpiprazole  1 mg Oral QHS   Chlorhexidine Gluconate Cloth  6 each Topical Daily   docusate sodium  100 mg Oral BID   feeding supplement  237 mL Oral BID BM   insulin aspart  0-9 Units Subcutaneous Q4H   levothyroxine  88 mcg Oral Q0600    metoprolol succinate  50 mg Oral Daily   mouth rinse  15 mL Mouth Rinse 4 times per day   pantoprazole  40 mg Oral QAC supper   polyethylene glycol  17 g Oral Daily   simethicone  80 mg Oral QID   sodium chloride flush  10-40 mL Intracatheter Q12H   venlafaxine XR  150 mg Oral q AM   Continuous Infusions:        Glade Lloyd, MD [facility] 05/17/2023, 11:08 AM

## 2023-05-17 NOTE — Progress Notes (Signed)
 Physical Therapy Treatment Patient Details Name: Erica [name] MRN: [id number] DOB: [birth year] Today's Date: 05/17/2023   History of Present Illness Pt is 89 year old presented to [facility] ED on  05/08/23 for nausea, vomiting, diarrhea. Pt found to have partial SBO. Difficulty placing NGT led to aspiration and pt intubated and transferred to [facility]. Pt extubated 05/09/23. Pt developed afib with rvr. PMH - rheumatoid arthritis, bipolar    PT Comments  Pt in bed upon arrival and agreeable to PT session. Worked on LE strength, balance, and transfers in today's session. Pt continues to need ModA for bed mobility and MinA/ModA to stand with RW. Pt needs assist to boost up and for steadying as pt tends to lean posteriorly. Pt was able to take short steps towards the recliner with multiple cues for sequencing and assist for balance/RW management. Pt was unable to move RW while taking steps. Pt is progressing towards goals. Pt would continue to benefit from <3hrs post acute rehab to work towards independence with mobility. Acute PT to follow.      If plan is discharge home, recommend the following: A lot of help with walking and/or transfers;A lot of help with bathing/dressing/bathroom;Assistance with cooking/housework;Direct supervision/assist for medications management;Direct supervision/assist for financial management;Assist for transportation;Help with stairs or ramp for entrance;Supervision due to cognitive status   Can travel by private vehicle     No  Equipment Recommendations  BSC/3in1;Wheelchair (measurements PT);Wheelchair cushion (measurements PT)       Precautions / Restrictions Precautions Precautions: Fall Recall of Precautions/Restrictions: Impaired Restrictions Weight Bearing Restrictions Per Provider Order: No     Mobility  Bed Mobility Overal bed mobility: Needs Assistance   Rolling: Mod assist Sidelying to sit: Mod assist       General bed mobility comments: Pt able to  move LE's towards EOB, ModA to roll onto left side and assist with trunk elevation. ModA to shift hips forwards.    Transfers Overall transfer level: Needs assistance Equipment used: Rolling walker (2 wheels) Transfers: Sit to/from Stand, Bed to chair/wheelchair/BSC Sit to Stand: Min assist, Mod assist   Step pivot transfers: Mod assist     General transfer comment: ModA/MinA to stand for boost up and steadying. Occasional posterior lean. Able to take very short steps towards recliner with assist to move RW. Frequent cues for technique.      Balance Overall balance assessment: Needs assistance Sitting-balance support: No upper extremity supported, Feet supported Sitting balance-Leahy Scale: Good Sitting balance - Comments: EOB Postural control: Posterior lean Standing balance support: During functional activity, Bilateral upper extremity supported, Reliant on assistive device for balance Standing balance-Leahy Scale: Poor Standing balance comment: reliant on RW and external support     Communication Communication Communication: Impaired Factors Affecting Communication: Hearing impaired  Cognition Arousal: Alert Behavior During Therapy: WFL for tasks assessed/performed   PT - Cognitive impairments: Awareness  Following commands: Intact Following commands impaired: Follows one step commands with increased time    Cueing Cueing Techniques: Verbal cues, Tactile cues  Exercises General Exercises - Lower Extremity Ankle Circles/Pumps: AROM, Both, 10 reps, Supine Long Arc Quad: AROM, Both, 10 reps, Seated Hip ABduction/ADduction: AROM, Both, 5 reps, Supine Straight Leg Raises: AROM, Both, 5 reps, Supine (limited ROM) Hip Flexion/Marching: AROM, Both, 5 reps, Standing Other Exercises Other Exercises: 3 serial STS with RW and ModA/MinA    General Comments General comments (skin integrity, edema, etc.): VSS on 2L, HR from 105-115 BPM during activity      Pertinent  Vitals/Pain  Pain Assessment Pain Assessment: No/denies pain     PT Goals (current goals can now be found in the care plan section) Acute Rehab PT Goals PT Goal Formulation: With patient Time For Goal Achievement: 05/24/23 Potential to Achieve Goals: Fair Progress towards PT goals: Progressing toward goals    Frequency    Min 1X/week       AM-PAC PT "6 Clicks" Mobility   Outcome Measure  Help needed turning from your back to your side while in a flat bed without using bedrails?: A Lot Help needed moving from lying on your back to sitting on the side of a flat bed without using bedrails?: A Lot Help needed moving to and from a bed to a chair (including a wheelchair)?: A Lot Help needed standing up from a chair using your arms (e.g., wheelchair or bedside chair)?: A Lot Help needed to walk in hospital room?: Total Help needed climbing 3-5 steps with a railing? : Total 6 Click Score: 10    End of Session Equipment Utilized During Treatment: Gait belt;Oxygen Activity Tolerance: Patient tolerated treatment well Patient left: in chair;with call bell/phone within reach;with chair alarm set;with nursing/sitter in room Nurse Communication: Mobility status PT Visit Diagnosis: Unsteadiness on feet (R26.81);Other abnormalities of gait and mobility (R26.89);Muscle weakness (generalized) (M62.81)     Time: 3086-5784 PT Time Calculation (min) (ACUTE ONLY): 23 min  Charges:    $Therapeutic Exercise: 8-22 mins $Therapeutic Activity: 8-22 mins PT General Charges $$ ACUTE PT VISIT: 1 Visit                     [name], PT, DPT Secure Chat Preferred  Rehab Office [phone]   [name] 05/17/2023, 10:45 AM

## 2023-05-17 NOTE — TOC Progression Note (Signed)
 Transition of Care [facility]) - Progression Note    Patient Details  Name: Erica [name] MRN: [id number] Date of Birth: 01-28-1933  [facility] CM/SW Contact  Michaela Corner, [place] Phone Number: 05/17/2023, 11:07 AM  Clinical Narrative:   Per MD, will look at discharge to [facility] tomorrow 3/6. CSW spoke to [facility] admissions and they can accept patient for STR tomorrow.   TOC will continue to follow.   Expected Discharge Plan: Skilled Nursing Facility Barriers to Discharge: Continued Medical Work up  Expected Discharge Plan and Services In-house Referral: Clinical Social Work     Living arrangements for the past 2 months: Skilled Nursing Facility                                       Social Determinants of Health (SDOH) Interventions SDOH Screenings   Food Insecurity: No Food Insecurity (05/14/2023)  Housing: Low Risk  (05/14/2023)  Transportation Needs: No Transportation Needs (05/17/2023)  Utilities: Not At Risk (05/14/2023)  Financial Resource Strain: Low Risk  (02/13/2020)   Received from [facility], [facility]  Social Connections: Socially Isolated (05/17/2023)  Stress: Stress Concern Present (02/13/2020)   Received from [facility], [facility]  Tobacco Use: Medium Risk (05/09/2023)    Readmission Risk Interventions    05/09/2023    4:27 PM  Readmission Risk Prevention Plan  Transportation Screening Complete  PCP or Specialist Appt within 5-7 Days Complete  Home Care Screening Complete  Medication Review (RN CM) Referral to Pharmacy

## 2023-05-18 DIAGNOSIS — J9601 Acute respiratory failure with hypoxia: Secondary | ICD-10-CM | POA: Diagnosis not present

## 2023-05-18 LAB — GLUCOSE, CAPILLARY
Glucose-Capillary: 101 mg/dL — ABNORMAL HIGH (ref 70–99)
Glucose-Capillary: 107 mg/dL — ABNORMAL HIGH (ref 70–99)
Glucose-Capillary: 136 mg/dL — ABNORMAL HIGH (ref 70–99)
Glucose-Capillary: 93 mg/dL (ref 70–99)

## 2023-05-18 LAB — BASIC METABOLIC PANEL
Anion gap: 4 — ABNORMAL LOW (ref 5–15)
BUN: 9 mg/dL (ref 8–23)
CO2: 26 mmol/L (ref 22–32)
Calcium: 8.4 mg/dL — ABNORMAL LOW (ref 8.9–10.3)
Chloride: 105 mmol/L (ref 98–111)
Creatinine, Ser: 0.71 mg/dL (ref 0.44–1.00)
GFR, Estimated: >60 mL/min (ref >60)
Glucose, Bld: 110 mg/dL — ABNORMAL HIGH (ref 70–99)
Potassium: 4.1 mmol/L (ref 3.5–5.1)
Sodium: 135 mmol/L (ref 135–145)

## 2023-05-18 LAB — MAGNESIUM: Magnesium: 1.9 mg/dL (ref 1.7–2.4)

## 2023-05-18 LAB — CULTURE, BLOOD (ROUTINE X 2)
Culture: NO GROWTH
Culture: NO GROWTH
Special Requests: ADEQUATE
Special Requests: ADEQUATE

## 2023-05-18 MED ORDER — LORAZEPAM 0.5 MG PO TABS: 0.5000 mg | ORAL_TABLET | Freq: Two times a day (BID) | ORAL | 0 refills | Status: DC | PRN

## 2023-05-18 MED ORDER — POLYETHYLENE GLYCOL 3350 17 G PO PACK: 17.0000 g | PACK | Freq: Every day | ORAL | 0 refills | Status: AC

## 2023-05-18 MED ORDER — METOPROLOL SUCCINATE ER 25 MG PO TB24: 50.0000 mg | ORAL_TABLET | Freq: Every morning | ORAL | Status: AC

## 2023-05-18 MED ORDER — PANTOPRAZOLE SODIUM 40 MG PO TBEC: 40.0000 mg | DELAYED_RELEASE_TABLET | Freq: Every day | ORAL | Status: AC

## 2023-05-18 MED ORDER — AMIODARONE HCL 200 MG PO TABS: ORAL_TABLET | ORAL | 0 refills | Status: DC

## 2023-05-18 MED ORDER — DOCUSATE SODIUM 100 MG PO CAPS
100.0000 mg | ORAL_CAPSULE | Freq: Two times a day (BID) | ORAL | 0 refills | Status: AC
Start: 2023-05-18 — End: ?

## 2023-05-18 MED ORDER — APIXABAN 2.5 MG PO TABS: 2.5000 mg | ORAL_TABLET | Freq: Two times a day (BID) | ORAL | 0 refills | Status: AC

## 2023-05-18 NOTE — Care Management Important Message (Signed)
 Important Message  Patient Details  Name: Erica [name] MRN: [id number] Date of Birth: 1932/07/02   Important Message Given:  Yes - Medicare IM     [name] 05/18/2023, 10:38 AM

## 2023-05-18 NOTE — Plan of Care (Signed)

## 2023-05-18 NOTE — TOC Transition Note (Signed)
 Transition of Care [facility]) - Discharge Note   Patient Details  Name: Erica [name] MRN: [id number] Date of Birth: [birth year]  Transition of Care [facility]) CM/SW Contact:  Michaela Corner, LCSWA Phone Number: 05/18/2023, 10:25 AM   Clinical Narrative:   Patient will DC to: [facility] Anticipated DC date: 05/18/23 Family notified: [name] (dtr) Transport by: Sharin Mons   Per MD patient ready for DC to [facility]. RN to call report prior to discharge [phone]). RN, patient, patient's family, and facility notified of DC. Discharge Summary and FL2 sent to facility. DC packet on chart. Ambulance transport requested for patient.   CSW will sign off for now as social work intervention is no longer needed. Please consult [place] again if new needs arise.    Final next level of care: Skilled Nursing Facility Barriers to Discharge: Barriers Resolved   Patient Goals and CMS Choice Patient states their goals for this hospitalization and ongoing recovery are:: Back to [facility] SNF          Discharge Placement              Patient chooses bed at: [facility] Patient to be transferred to facility by: [facility] Name of family member notified: [name] (dtr) Patient and family notified of of transfer: 05/18/23  Discharge Plan and Services Additional resources added to the After Visit Summary for   In-house Referral: Clinical Social Work                                   Social Drivers of Health (SDOH) Interventions SDOH Screenings   Food Insecurity: No Food Insecurity (05/14/2023)  Housing: Low Risk  (05/14/2023)  Transportation Needs: No Transportation Needs (05/17/2023)  Utilities: Not At Risk (05/14/2023)  Financial Resource Strain: Low Risk  (02/13/2020)   Received from [facility], [facility]  Social Connections: Socially Isolated (05/17/2023)  Stress: Stress Concern Present (02/13/2020)   Received from [facility], [facility]  Tobacco Use: Medium Risk (05/09/2023)      Readmission Risk Interventions    05/09/2023    4:27 PM  Readmission Risk Prevention Plan  Transportation Screening Complete  PCP or Specialist Appt within 5-7 Days Complete  Home Care Screening Complete  Medication Review (RN CM) Referral to Pharmacy

## 2023-05-18 NOTE — Progress Notes (Signed)
 Per verbal order from Dr Hanley Ben, 40 cm double Lumen Peripherally Inserted Central Catheter removed from left basilic, tip intact. No sutures present. RN confirmed length per chart. Dressing was clean and dry. Petroleum dressing applied. Pt advised no heavy lifting with this arm, leave dressing for 24 hours and call the office or seek emergent care if dressing becomes soaked with blood or swelling or sharp pain presents. Patient family members verbalized understanding and agreement.   questions answered to their satisfaction. Patient tolerated procedure well, patient to remain in bed for 30 minutes post-removal to ensure no complications.   Lorri Fukuhara Loyola Mast, RN

## 2023-05-18 NOTE — Progress Notes (Signed)
 Report given to sherri at rehab

## 2023-05-18 NOTE — Discharge Summary (Signed)
 Physician Discharge Summary  JHOSELYN [name] OZD:[id number] DOB: 15-Jul-1932 DOA: 05/08/2023  PCP: Aliene Beams, MD  Admit date: 05/08/2023 Discharge date: 05/18/2023  Admitted From: Home Disposition: SNF  Recommendations for Outpatient Follow-up:  Follow up with SNF provider at earliest convenience Outpatient follow-up with cardiology Follow up in ED if symptoms worsen or new appear   Home Health: No Equipment/Devices: None  Discharge Condition: Stable CODE STATUS: DNR Diet recommendation: Heart healthy/soft diet  Brief/Interim Summary: 89 year old female with history of bowel obstruction, hypertension, rheumatoid arthritis, bipolar disorder, hypothyroidism, hyperlipidemia presented with nausea, vomiting. She was found to have SBO. She had difficulty passing NGT which led to an aspiration event for which she was intubated in the ED. She was admitted to ICU under PCCM service. General surgery was consulted. She was started on broad-spectrum antibiotics. She was subsequently extubated on 05/09/2023. Hospital course complicated by A-fib with RVR requiring cardiology consultation. She was started on amiodarone and heparin drips. She was transferred to [facility] service from 05/12/2023 onwards. Subsequently, her condition has improved. NG tube has been removed and she is currently tolerating diet and having bowel function. Subsequently, she has been switched to oral amiodarone and Eliquis. Cardiology and general surgery signed off on 05/16/2023. PT recommending SNF placement.  She will be discharged to SNF once bed is available.  Discharge Diagnoses:   Acute hypoxic and hypercarbic respiratory failure Possible aspiration pneumonia -Secondary to aspiration and CHF exacerbation -Needed intubation on presentation.  Extubated on 05/09/2023.  Currently still requiring  2 L oxygen via nasal cannula intermittently.  Wean off as able. -Care transferred to [facility] service from 05/12/2023 onwards. -Patient received 7  days of IV Rocephin and Flagyl. -Also received intermittent IV Lasix   Acute diastolic CHF -Continue strict input and output.  Daily weights.  Fluid restriction. -2D echo showed EF of 70 to 75% with grade 1 diastolic dysfunction -Received IV Lasix intermittently during the hospitalization along with IV albumin.  Currently on metoprolol succinate.  Cardiology signed off on 05/16/2023.  Outpatient follow-up with cardiology   Paroxysmal A-fib with RVR -Treated with heparin drip and amiodarone drips initially and subsequently transition to oral amiodarone and oral Eliquis.  Currently rate controlled.  Continue metoprolol succinate.  Outpatient follow-up with cardiology   SBO -Treated conservatively with NG tube/n.p.o.  Subsequently, NG tube has been removed -Resolved.  Currently tolerating diet and having bowel movements.  Continue daily MiraLAX.  General surgery signed off on 05/16/2023   Leukocytosis -Resolved   Thrombocytopenia -Resolved   Hypokalemia -Resolved   Hypophosphatemia -Resolved   Hypomagnesemia -Resolved   Hypoalbuminemia -Encourage oral intake   History of rheumatoid arthritis -Outpatient follow-up   Hypothyroidism -Continue levothyroxine   Bipolar disorder/depression -Continue home regimen.  Outpatient follow-up with PCP and/or psychiatry  GERD -Continue PPI   Stage I coccygeal pressure ulcer: Present on admission -Continue local wound care   Physical deconditioning -PT recommending SNF placement.   Discharge Instructions   Allergies as of 05/18/2023       Reactions   Penicillins Itching, Rash   Hydrocodone Other (See Comments)   MAO-I ; cannot take medications.    Morphine And Codeine    Unable to take due to MAO-I   Morphine Sulfate Other (See Comments)   Sulfamethoxazole-trimethoprim Nausea Only, Other (See Comments), Nausea And Vomiting   And diarrhea. Other Reaction(s): GI Intolerance        Medication List     STOP taking these  medications    ARIPiprazole 5  MG tablet Commonly known as: ABILIFY   loperamide 2 MG tablet Commonly known as: IMODIUM A-D   losartan 50 MG tablet Commonly known as: COZAAR   MVW COMPLETE PROBIOTIC PO   phenelzine 15 MG tablet Commonly known as: NARDIL       TAKE these medications    acetaminophen 500 MG tablet Commonly known as: TYLENOL Take 1,000 mg by mouth 3 (three) times daily as needed (joint pain).   amiodarone 200 MG tablet Commonly known as: PACERONE 200mg  bid till 05/20/23 then 200mg  daily   apixaban 2.5 MG Tabs tablet Commonly known as: ELIQUIS Take 1 tablet (2.5 mg total) by mouth 2 (two) times daily.   B-complex with vitamin C tablet Take 1 tablet by mouth in the morning.   benztropine 0.5 MG tablet Commonly known as: COGENTIN Take 0.5 mg by mouth at bedtime.   cyanocobalamin 1000 MCG tablet Commonly known as: VITAMIN B12 Take 1,000 mcg by mouth in the morning.   docusate sodium 100 MG capsule Commonly known as: COLACE Take 1 capsule (100 mg total) by mouth 2 (two) times daily.   L-Methylfolate Forte 15-90.314 MG Caps Take 1 capsule by mouth in the morning.   levothyroxine 88 MCG tablet Commonly known as: SYNTHROID Take 88 mcg by mouth daily before breakfast.   LORazepam 0.5 MG tablet Commonly known as: ATIVAN Take 1 tablet (0.5 mg total) by mouth 2 (two) times daily as needed for anxiety. What changed:  when to take this reasons to take this   melatonin 5 MG Tabs Take 5 mg by mouth at bedtime.   metoprolol succinate 25 MG 24 hr tablet Commonly known as: TOPROL-XL Take 2 tablets (50 mg total) by mouth in the morning. What changed: how much to take   multivitamin with minerals tablet Take 1 tablet by mouth in the morning.   nitrofurantoin 50 MG capsule Commonly known as: MACRODANTIN Take 1 capsule (50 mg total) by mouth at bedtime.   pantoprazole 40 MG tablet Commonly known as: PROTONIX Take 1 tablet (40 mg total) by mouth  daily. Before dinner.   polyethylene glycol 17 g packet Commonly known as: MIRALAX / GLYCOLAX Take 17 g by mouth daily.   Rexulti 1 MG Tabs tablet Generic drug: brexpiprazole Take 1 mg by mouth at bedtime.   venlafaxine XR 150 MG 24 hr capsule Commonly known as: EFFEXOR-XR Take 150 mg by mouth in the morning.   Vitamin D 50 MCG (2000 UT) tablet Take 2,000 Units by mouth in the morning.               Durable Medical Equipment  (From admission, onward)           Start     Ordered   05/15/23 1854  For home use only DME 3 n 1  Once        05/15/23 1853              Contact information for after-discharge care     Destination     [facility] .   Service: Skilled Nursing Contact information: [address] [place] [address] [phone]                    Allergies  Allergen Reactions   Penicillins Itching and Rash   Hydrocodone Other (See Comments)    MAO-I ; cannot take medications.    Morphine And Codeine     Unable to take due to  MAO-I   Morphine Sulfate Other (See Comments)   Sulfamethoxazole-Trimethoprim Nausea Only, Other (See Comments) and Nausea And Vomiting    And diarrhea.  Other Reaction(s): GI Intolerance    Consultations: PCCM/General Surgery/cardiology   Procedures/Studies: [place] EKG SITE RITE Result Date: 05/15/2023 If Site Rite image not attached, placement could not be confirmed due to current cardiac rhythm.  VAS [place] UPPER EXTREMITY VENOUS DUPLEX Result Date: 05/14/2023 UPPER VENOUS STUDY  Patient Name:  GER NICKS  Date of Exam:   05/13/2023 Medical Rec #: [id number]        Accession #:    [id number] Date of Birth: 02-01-1933        Patient Gender: F Patient Age:   89 years Exam Location:  [facility] Procedure:      VAS [place] UPPER EXTREMITY VENOUS DUPLEX Referring Phys: Dolly Rias  --------------------------------------------------------------------------------  Indications: Swelling, and Erythema Limitations: Poor ultrasound/tissue interface and patient unable to keep arm rotated. Comparison Study: No prior study on file Performing Technologist: Sherren Kerns RVS  Examination Guidelines: A complete evaluation includes B-mode imaging, spectral Doppler, color Doppler, and power Doppler as needed of all accessible portions of each vessel. Bilateral testing is considered an integral part of a complete examination. Limited examinations for reoccurring indications may be performed as noted.  Right Findings: +----------+------------+---------+-----------+----------+---------------------+ RIGHT     CompressiblePhasicitySpontaneousProperties       Summary        +----------+------------+---------+-----------+----------+---------------------+ IJV           Full       Yes       Yes                                    +----------+------------+---------+-----------+----------+---------------------+ Subclavian    Full       Yes       Yes                                    +----------+------------+---------+-----------+----------+---------------------+ Axillary      Full       Yes       Yes                                    +----------+------------+---------+-----------+----------+---------------------+ Brachial                 Yes       Yes               patent by color and                                                             Doppler        +----------+------------+---------+-----------+----------+---------------------+ Radial                                                 patent by color    +----------+------------+---------+-----------+----------+---------------------+ Ulnar  Not visualized     +----------+------------+---------+-----------+----------+---------------------+ Cephalic                  Yes       Yes               patent by color and                                                             Doppler        +----------+------------+---------+-----------+----------+---------------------+ Basilic       None                                          Acute         +----------+------------+---------+-----------+----------+---------------------+  Left Findings: +----------+------------+---------+-----------+----------+-------+ LEFT      CompressiblePhasicitySpontaneousPropertiesSummary +----------+------------+---------+-----------+----------+-------+ Subclavian               Yes       Yes                      +----------+------------+---------+-----------+----------+-------+  Summary:  Right: No evidence of deep vein thrombosis in the upper extremity. Findings consistent with acute superficial vein thrombosis involving the right basilic vein.  Left: No evidence of superficial vein thrombosis in the upper extremity.  *See table(s) above for measurements and observations.  Diagnosing physician: Coral Else MD Electronically signed by Coral Else MD on 05/14/2023 at 10:44:40 AM.    Final    [place] EKG SITE RITE Result Date: 05/14/2023 If Site Rite image not attached, placement could not be confirmed due to current cardiac rhythm.  DG CHEST PORT 1 VIEW Result Date: 05/12/2023 CLINICAL DATA:  Shortness of breath. EXAM: PORTABLE CHEST 1 VIEW COMPARISON:  One-view chest x-ray 05/09/2023 FINDINGS: The patient has been extubated.  Gastric tube was removed. The heart size is normal. Atherosclerotic changes are present at the aortic arch progressive interstitial and airspace disease is present bilaterally. Progressive bilateral effusions are present. IMPRESSION: 1. Progressive interstitial and airspace disease bilaterally consistent with progressive bilateral pneumonia and edema. 2. Progressive bilateral effusions. Electronically Signed   By: Marin Roberts M.D.    On: 05/12/2023 14:17   DG Abd Portable 1V Result Date: 05/12/2023 CLINICAL DATA:  Follow up small bowel obstruction. EXAM: PORTABLE ABDOMEN - 1 VIEW COMPARISON:  Radiographs 05/11/2023 and 05/10/2023.  CT 05/08/2023. FINDINGS: 0830 hours. 2 portable supine views of the abdomen are submitted. Previously administered enteric contrast remains within nondilated colon. No significant retained contrast within the small bowel. There are few mildly dilated loops of small bowel centrally. No evidence of contrast extravasation. Multiple surgical clips are noted. Enteric tube has been removed. IMPRESSION: Previously administered enteric contrast remains within nondilated colon. No significant retained contrast within the small bowel. Electronically Signed   By: Carey Bullocks M.D.   On: 05/12/2023 12:30   ECHOCARDIOGRAM COMPLETE Result Date: 05/12/2023    ECHOCARDIOGRAM REPORT   Patient Name:   ANVITHA HUTMACHER Date of Exam: 05/12/2023 Medical Rec #:  [id number]       Height:       60.0 in Accession #:    [id number]      Weight:  135.6 lb Date of Birth:  1932/12/14       BSA:          1.582 m Patient Age:    89 years        BP:           158/93 mmHg Patient Gender: F               HR:           106 bpm. Exam Location:  Inpatient Procedure: 2D Echo, Cardiac Doppler and Color Doppler (Both Spectral and Color            Flow Doppler were utilized during procedure). Indications:    I48.91* Unspecified atrial fibrillation  History:        Patient has prior history of Echocardiogram examinations, most                 recent 05/01/2017. Arrythmias:Atrial Fibrillation; Risk                 Factors:Hypertension and Dyslipidemia.  Sonographer:    Irving Burton Senior RDCS Referring Phys: [id number] DANIEL V THOMPSON IMPRESSIONS  1. Left ventricular ejection fraction, by estimation, is 70 to 75%. Left ventricular ejection fraction by PLAX is 75 %. The left ventricle has hyperdynamic function. The left ventricle has no regional wall motion  abnormalities. Left ventricular diastolic parameters are consistent with Grade I diastolic dysfunction (impaired relaxation).  2. Right ventricular systolic function is normal. The right ventricular size is normal. There is normal pulmonary artery systolic pressure. The estimated right ventricular systolic pressure is 29.8 mmHg.  3. The mitral valve is normal in structure. Mild mitral valve regurgitation. Moderate mitral annular calcification.  4. The aortic valve is tricuspid. Aortic valve regurgitation is not visualized. Aortic valve sclerosis is present, with no evidence of aortic valve stenosis.  5. The inferior vena cava is normal in size with greater than 50% respiratory variability, suggesting right atrial pressure of 3 mmHg. FINDINGS  Left Ventricle: Left ventricular ejection fraction, by estimation, is 70 to 75%. Left ventricular ejection fraction by PLAX is 75 %. The left ventricle has hyperdynamic function. The left ventricle has no regional wall motion abnormalities. The left ventricular internal cavity size was normal in size. There is no left ventricular hypertrophy. Left ventricular diastolic parameters are consistent with Grade I diastolic dysfunction (impaired relaxation). Right Ventricle: The right ventricular size is normal. No increase in right ventricular wall thickness. Right ventricular systolic function is normal. There is normal pulmonary artery systolic pressure. The tricuspid regurgitant velocity is 2.59 m/s, and  with an assumed right atrial pressure of 3 mmHg, the estimated right ventricular systolic pressure is 29.8 mmHg. Left Atrium: Left atrial size was normal in size. Right Atrium: Right atrial size was normal in size. Pericardium: Trivial pericardial effusion is present. Mitral Valve: The mitral valve is normal in structure. Moderate mitral annular calcification. Mild mitral valve regurgitation. Tricuspid Valve: The tricuspid valve is normal in structure. Tricuspid valve  regurgitation is trivial. Aortic Valve: The aortic valve is tricuspid. Aortic valve regurgitation is not visualized. Aortic valve sclerosis is present, with no evidence of aortic valve stenosis. Pulmonic Valve: The pulmonic valve was normal in structure. Pulmonic valve regurgitation is trivial. Aorta: The aortic root and ascending aorta are structurally normal, with no evidence of dilitation. Venous: The inferior vena cava is normal in size with greater than 50% respiratory variability, suggesting right atrial pressure of 3 mmHg. IAS/Shunts: No atrial level shunt detected  by color flow Doppler.  LEFT VENTRICLE PLAX 2D LV EF:         Left            Diastology                ventricular     LV e' medial:    9.14 cm/s                ejection        LV E/e' medial:  8.4                fraction by     LV e' lateral:   9.79 cm/s                PLAX is 75      LV E/e' lateral: 7.8                %. LVIDd:         3.30 cm LVIDs:         1.90 cm LV PW:         0.80 cm LV IVS:        1.00 cm LVOT diam:     1.90 cm LV SV:         45 LV SV Index:   29 LVOT Area:     2.84 cm  RIGHT VENTRICLE RV S prime:     15.10 cm/s TAPSE (M-mode): 1.9 cm LEFT ATRIUM             Index        RIGHT ATRIUM          Index LA diam:        2.80 cm 1.77 cm/m   RA Area:     8.27 cm LA Vol (A2C):   22.2 ml 14.03 ml/m  RA Volume:   13.60 ml 8.59 ml/m LA Vol (A4C):   36.3 ml 22.94 ml/m LA Biplane Vol: 30.3 ml 19.15 ml/m  AORTIC VALVE LVOT Vmax:   94.20 cm/s LVOT Vmean:  61.700 cm/s LVOT VTI:    0.160 m  AORTA Ao Root diam: 2.70 cm Ao Asc diam:  2.60 cm MITRAL VALVE                TRICUSPID VALVE MV Area (PHT): 2.49 cm     TR Peak grad:   26.8 mmHg MV Decel Time: 305 msec     TR Vmax:        259.00 cm/s MV E velocity: 76.50 cm/s MV A velocity: 106.00 cm/s  SHUNTS MV E/A ratio:  0.72         Systemic VTI:  0.16 m                             Systemic Diam: 1.90 cm Clearnce Hasten Electronically signed by Clearnce Hasten Signature Date/Time:  05/12/2023/10:04:37 AM    Final    DG Abd Portable 1V Result Date: 05/11/2023 CLINICAL DATA:  Small-bowel obstruction EXAM: PORTABLE ABDOMEN - 1 VIEW COMPARISON:  X-ray 05/10/2023 and older FINDINGS: Enteric tube overlying the presumed distal stomach in the right upper abdomen. Minimal small bowel gas. Scattered surgical changes. Contrast now seen in nondilated loops of colon diffusely. Curvature and advanced degenerative changes along the spine. Osteopenia. Overlapping cardiac leads. No obvious free air on this portable supine radiograph IMPRESSION: Contrast now seen along nondilated colon diffusely. Please correlate with the  time course of contrast administration. Electronically Signed   By: Karen Kays M.D.   On: 05/11/2023 13:53   DG Abd Portable 1V-Small Bowel Obstruction Protocol-initial, 8 hr delay Result Date: 05/10/2023 CLINICAL DATA:  Small-bowel obstruction follow-up EXAM: PORTABLE ABDOMEN - 1 VIEW COMPARISON:  Film from earlier in the same day. FINDINGS: Gastric catheter remains in the stomach. Administered contrast lies within the stomach and dilated small bowel centrally. No colonic contrast is noted. 24 hour follow-up film is recommended. No other focal abnormality is seen. IMPRESSION: No colonic contrast seen.  24 hour follow-up film is recommended. Electronically Signed   By: Alcide Clever M.D.   On: 05/10/2023 21:25   DG Abd Portable 1V-Small Bowel Protocol-Position Verification Result Date: 05/10/2023 CLINICAL DATA:  [id number] Encounter for imaging study to confirm nasogastric (NG) tube placement [id number] EXAM: PORTABLE ABDOMEN - 1 VIEW COMPARISON:  X-ray abdomen 05/09/2023 FINDINGS: Enteric tube courses below the hemidiaphragm with tip and side port overlying the expected region of the gastric lumen. Persistent gaseous dilatation of a short loop of small bowel within the left mid to lower abdomen. No radio-opaque calculi or other significant radiographic abnormality are seen. IMPRESSION: 1.  Enteric tube in good position. 2. Persistent gaseous dilatation of a short loop of small bowel within the left mid to lower abdomen. Electronically Signed   By: Tish Frederickson M.D.   On: 05/10/2023 10:26   DG Abdomen 1 View Result Date: 05/09/2023 CLINICAL DATA:  Post intubation. Nausea, vomiting, and diarrhea for 2 days, small-bowel obstruction. EXAM: ABDOMEN - 1 VIEW COMPARISON:  05/09/2023, 05/08/2023. FINDINGS: Multiple loops of distended small bowel are present in the abdomen measuring up to 4.8 cm in diameter. An enteric tube terminates in the stomach. Mild airspace disease is noted at the lung bases. There is levoscoliosis with degenerative changes in the lumbar spine. Surgical changes are present in the left lower quadrant. Excreted contrast is present in the urinary bladder. There is a small outpouching of the bladder on the right, possible diverticulum. IMPRESSION: 1. Slight interval worsening of distended loops of small bowel in the abdomen, compatible with small-bowel obstruction. 2. Mild airspace disease at the lung bases, possible atelectasis or infiltrate. Electronically Signed   By: Thornell Sartorius M.D.   On: 05/09/2023 07:14   DG Chest Portable 1 View Result Date: 05/09/2023 CLINICAL DATA:  Intubation. EXAM: PORTABLE CHEST 1 VIEW COMPARISON:  05/01/2020 FINDINGS: Endotracheal tube with tip 2.5 cm above the carina. The enteric tube at least reaches the stomach. Indistinct airspace density in the lower lungs. No pleural effusion, pneumothorax, or collapse. Normal heart size and mediastinal contours. IMPRESSION: 1. Unremarkable hardware as described. 2. Bilateral pulmonary infiltrate/pneumonia. Electronically Signed   By: Tiburcio Pea M.D.   On: 05/09/2023 07:11   DG Abdomen 1 View Result Date: 05/09/2023 CLINICAL DATA:  NG tube placement EXAM: ABDOMEN - 1 VIEW COMPARISON:  CT abdomen/pelvis dated 05/08/2023 FINDINGS: Moderate hiatal hernia. Enteric tube terminates in the distal esophagus  just above the level of the hiatal hernia. IMPRESSION: Enteric tube terminates in the distal esophagus just above the level of a moderate hiatal hernia. Advancement is recommended. Electronically Signed   By: Charline Bills M.D.   On: 05/09/2023 03:31   CT ABDOMEN PELVIS W CONTRAST Result Date: 05/09/2023 CLINICAL DATA:  Abdominal pain EXAM: CT ABDOMEN AND PELVIS WITH CONTRAST TECHNIQUE: Multidetector CT imaging of the abdomen and pelvis was performed using the standard protocol following bolus administration of intravenous contrast. RADIATION DOSE  REDUCTION: This exam was performed according to the departmental dose-optimization program which includes automated exposure control, adjustment of the mA and/or kV according to patient size and/or use of iterative reconstruction technique. CONTRAST:  75mL OMNIPAQUE IOHEXOL 300 MG/ML  SOLN COMPARISON:  05/02/2020 FINDINGS: Lower chest: Large hiatal hernia.  No acute findings. Hepatobiliary: No focal hepatic abnormality. Gallbladder unremarkable. Pancreas: No focal abnormality or ductal dilatation. Spleen: No focal abnormality.  Normal size. Adrenals/Urinary Tract: No adrenal abnormality. No focal renal abnormality. No stones or hydronephrosis. Urinary bladder is unremarkable. Stomach/Bowel: Dilated fluid-filled small bowel loops into the pelvis. Distal small bowel is decompressed. Findings compatible with distal small bowel obstruction. Large bowel unremarkable. Stomach is fluid-filled and distended. Vascular/Lymphatic: Aortoiliac atherosclerosis. No evidence of aneurysm or adenopathy. Reproductive: Prior hysterectomy.  No adnexal masses. Other: No free fluid or free air. Musculoskeletal: No acute bony abnormality. IMPRESSION: Distended fluid-filled stomach and small bowel into the pelvis. Distal small bowel decompressed. Findings compatible with distal small bowel obstruction. Large hiatal hernia, fluid-filled. Electronically Signed   By: Charlett Nose M.D.   On:  05/09/2023 00:46      Subjective: Patient seen and examined at bedside.  No worsening abdominal pain, fever, vomiting reported.  Discharge Exam: Vitals:   05/18/23 0029 05/18/23 0421  BP: 123/68 124/64  Pulse: 86 82  Resp: 13 13  Temp: 97.6 F (36.4 C) 97.7 F (36.5 C)  SpO2: 95% 97%    General: Pt is alert, awake, not in acute distress.  Elderly female, chronically ill and deconditioned looking.  On 2 L oxygen via nasal cannula. Cardiovascular: rate controlled, S1/S2 + Respiratory: bilateral decreased breath sounds at bases with scattered crackles Abdominal: Soft, NT, ND, bowel sounds + Extremities: Trace lower extremity edema present; no cyanosis    The results of significant diagnostics from this hospitalization (including imaging, microbiology, ancillary and laboratory) are listed below for reference.     Microbiology: Recent Results (from the past 240 hours)  MRSA Next Gen by PCR, Nasal     Status: None   Collection Time: 05/09/23  8:52 AM   Specimen: Nasal Mucosa; Nasal Swab  Result Value Ref Range Status   MRSA by PCR Next Gen NOT DETECTED NOT DETECTED Final    Comment: (NOTE) The GeneXpert MRSA Assay (FDA approved for NASAL specimens only), is one component of a comprehensive MRSA colonization surveillance program. It is not intended to diagnose MRSA infection nor to guide or monitor treatment for MRSA infections. Test performance is not FDA approved in patients less than 58 years old. Performed at [facility], [address] [place], [place] [address]   Resp panel by RT-PCR (RSV, Flu A&B, Covid) Anterior Nasal Swab     Status: None   Collection Time: 05/13/23 12:58 AM   Specimen: Anterior Nasal Swab  Result Value Ref Range Status   SARS Coronavirus 2 by RT PCR NEGATIVE NEGATIVE Final   Influenza A by PCR NEGATIVE NEGATIVE Final   Influenza B by PCR NEGATIVE NEGATIVE Final    Comment: (NOTE) The Xpert Xpress SARS-CoV-2/FLU/RSV plus assay is  intended as an aid in the diagnosis of influenza from Nasopharyngeal swab specimens and should not be used as a sole basis for treatment. Nasal washings and aspirates are unacceptable for Xpert Xpress SARS-CoV-2/FLU/RSV testing.  Fact Sheet for Patients: BloggerCourse.com  Fact Sheet for Healthcare Providers: SeriousBroker.it  This test is not yet approved or cleared by the [place] FDA and has been authorized for detection and/or  diagnosis of SARS-CoV-2 by FDA under an Emergency Use Authorization (EUA). This EUA will remain in effect (meaning this test can be used) for the duration of the COVID-19 declaration under Section 564(b)(1) of the Act, 21 U.S.C. section 360bbb-3(b)(1), unless the authorization is terminated or revoked.     Resp Syncytial Virus by PCR NEGATIVE NEGATIVE Final    Comment: (NOTE) Fact Sheet for Patients: BloggerCourse.com  Fact Sheet for Healthcare Providers: SeriousBroker.it  This test is not yet approved or cleared by the [place] FDA and has been authorized for detection and/or diagnosis of SARS-CoV-2 by FDA under an Emergency Use Authorization (EUA). This EUA will remain in effect (meaning this test can be used) for the duration of the COVID-19 declaration under Section 564(b)(1) of the Act, 21 U.S.C. section 360bbb-3(b)(1), unless the authorization is terminated or revoked.  Performed at [facility], [address] [place], [place] [address]   Culture, blood (Routine X 2) w Reflex to ID Panel     Status: None (Preliminary result)   Collection Time: 05/13/23  2:50 AM   Specimen: BLOOD LEFT ARM  Result Value Ref Range Status   Specimen Description BLOOD LEFT ARM  Final   Special Requests   Final    BOTTLES DRAWN AEROBIC AND ANAEROBIC Blood Culture adequate volume   Culture   Final    NO GROWTH 4 DAYS Performed at [facility], [address], [place], [place] [address]    Report Status PENDING  Incomplete  Culture, blood (Routine X 2) w Reflex to ID Panel     Status: None (Preliminary result)   Collection Time: 05/13/23  2:50 AM   Specimen: BLOOD RIGHT HAND  Result Value Ref Range Status   Specimen Description BLOOD RIGHT HAND  Final   Special Requests AEROBIC BOTTLE ONLY Blood Culture adequate volume  Final   Culture   Final    NO GROWTH 4 DAYS Performed at [facility], [address] [place], [place] [address]    Report Status PENDING  Incomplete     Labs: BNP (last 3 results) Recent Labs    05/12/23 1151 05/13/23 0250 05/14/23 0320  BNP 344.5* 303.2* 130.2*   Basic Metabolic Panel: Recent Labs  Lab 05/14/23 0320 05/15/23 0335 05/16/23 0520 05/17/23 0525 05/18/23 0345  NA 136  136 139 137 138 135  K 3.0*  3.1* 3.6 4.4 4.2 4.1  CL 101  98 104 105 101 105  CO2 23  23 25 24 27 26   GLUCOSE 110*  107* 93 95 119* 110*  BUN 14  14 7* 5* 10 9  CREATININE 0.70  0.71 0.66 0.69 0.79 0.71  CALCIUM 7.3*  7.7* 7.8* 8.3* 8.7* 8.4*  MG 2.1 1.7 2.0 1.9 1.9  PHOS 1.2* 2.0* 3.0 3.6  --    Liver Function Tests: Recent Labs  Lab 05/13/23 0250 05/14/23 0320 05/15/23 0335 05/16/23 0520 05/17/23 0525  ALBUMIN 2.2* 2.2* 3.2* 3.0* 3.0*   No results for input(s): "LIPASE", "AMYLASE" in the last 168 hours. No results for input(s): "AMMONIA" in the last 168 hours. CBC: Recent Labs  Lab 05/13/23 0250 05/14/23 0320 05/15/23 0335 05/16/23 0520 05/17/23 0525  WBC 11.6* 12.9* 9.3 10.7* 11.8*  NEUTROABS 8.5* 8.1* 7.0  --   --   HGB 11.5* 10.3* 8.8* 9.0* 9.6*  HCT 33.4* 30.1* 26.3* 27.6* 28.8*  MCV 94.4 95.3 96.3 97.9 97.3  PLT 120* 127* 141* 171 190  Cardiac Enzymes: No results for input(s): "CKTOTAL", "CKMB", "CKMBINDEX", "TROPONINI" in the last 168 hours. BNP: Invalid input(s): "POCBNP" CBG: Recent Labs  Lab 05/17/23 1140 05/17/23 1618 05/17/23 2107 05/18/23 0031  05/18/23 0423  GLUCAP 92 112* 108* 101* 107*   D-Dimer No results for input(s): "DDIMER" in the last 72 hours. Hgb A1c No results for input(s): "HGBA1C" in the last 72 hours. Lipid Profile No results for input(s): "CHOL", "HDL", "LDLCALC", "TRIG", "CHOLHDL", "LDLDIRECT" in the last 72 hours. Thyroid function studies No results for input(s): "TSH", "T4TOTAL", "T3FREE", "THYROIDAB" in the last 72 hours.  Invalid input(s): "FREET3" Anemia work up Recent Labs    05/16/23 0520  VITAMINB12 1,401*  FOLATE >40.0  FERRITIN 300  TIBC 147*  IRON 27*   Urinalysis    Component Value Date/Time   COLORURINE YELLOW 05/12/2023 2218   APPEARANCEUR CLEAR 05/12/2023 2218   APPEARANCEUR Clear 10/01/2021 1240   LABSPEC 1.012 05/12/2023 2218   PHURINE 5.0 05/12/2023 2218   GLUCOSEU NEGATIVE 05/12/2023 2218   HGBUR SMALL (A) 05/12/2023 2218   BILIRUBINUR NEGATIVE 05/12/2023 2218   BILIRUBINUR Negative 10/01/2021 1240   KETONESUR 20 (A) 05/12/2023 2218   PROTEINUR 30 (A) 05/12/2023 2218   NITRITE NEGATIVE 05/12/2023 2218   LEUKOCYTESUR NEGATIVE 05/12/2023 2218   Sepsis Labs Recent Labs  Lab 05/14/23 0320 05/15/23 0335 05/16/23 0520 05/17/23 0525  WBC 12.9* 9.3 10.7* 11.8*   Microbiology Recent Results (from the past 240 hours)  MRSA Next Gen by PCR, Nasal     Status: None   Collection Time: 05/09/23  8:52 AM   Specimen: Nasal Mucosa; Nasal Swab  Result Value Ref Range Status   MRSA by PCR Next Gen NOT DETECTED NOT DETECTED Final    Comment: (NOTE) The GeneXpert MRSA Assay (FDA approved for NASAL specimens only), is one component of a comprehensive MRSA colonization surveillance program. It is not intended to diagnose MRSA infection nor to guide or monitor treatment for MRSA infections. Test performance is not FDA approved in patients less than 47 years old. Performed at [facility], [address], [place], [place] [address]   Resp panel by RT-PCR (RSV, Flu A&B, Covid)  Anterior Nasal Swab     Status: None   Collection Time: 05/13/23 12:58 AM   Specimen: Anterior Nasal Swab  Result Value Ref Range Status   SARS Coronavirus 2 by RT PCR NEGATIVE NEGATIVE Final   Influenza A by PCR NEGATIVE NEGATIVE Final   Influenza B by PCR NEGATIVE NEGATIVE Final    Comment: (NOTE) The Xpert Xpress SARS-CoV-2/FLU/RSV plus assay is intended as an aid in the diagnosis of influenza from Nasopharyngeal swab specimens and should not be used as a sole basis for treatment. Nasal washings and aspirates are unacceptable for Xpert Xpress SARS-CoV-2/FLU/RSV testing.  Fact Sheet for Patients: BloggerCourse.com  Fact Sheet for Healthcare Providers: SeriousBroker.it  This test is not yet approved or cleared by the [place] FDA and has been authorized for detection and/or diagnosis of SARS-CoV-2 by FDA under an Emergency Use Authorization (EUA). This EUA will remain in effect (meaning this test can be used) for the duration of the COVID-19 declaration under Section 564(b)(1) of the Act, 21 U.S.C. section 360bbb-3(b)(1), unless the authorization is terminated or revoked.     Resp Syncytial Virus by PCR NEGATIVE NEGATIVE Final    Comment: (NOTE) Fact Sheet for Patients: BloggerCourse.com  Fact Sheet for Healthcare Providers: SeriousBroker.it  This test is not yet approved or cleared by  the [employer] and has been authorized for detection and/or diagnosis of SARS-CoV-2 by FDA under an Emergency Use Authorization (EUA). This EUA will remain in effect (meaning this test can be used) for the duration of the COVID-19 declaration under Section 564(b)(1) of the Act, 21 U.S.C. section 360bbb-3(b)(1), unless the authorization is terminated or revoked.  Performed at [facility], [address] [place], [place] [address]   Culture, blood (Routine X 2) w Reflex to  ID Panel     Status: None (Preliminary result)   Collection Time: 05/13/23  2:50 AM   Specimen: BLOOD LEFT ARM  Result Value Ref Range Status   Specimen Description BLOOD LEFT ARM  Final   Special Requests   Final    BOTTLES DRAWN AEROBIC AND ANAEROBIC Blood Culture adequate volume   Culture   Final    NO GROWTH 4 DAYS Performed at [facility], [address], [place], [place] [address]    Report Status PENDING  Incomplete  Culture, blood (Routine X 2) w Reflex to ID Panel     Status: None (Preliminary result)   Collection Time: 05/13/23  2:50 AM   Specimen: BLOOD RIGHT HAND  Result Value Ref Range Status   Specimen Description BLOOD RIGHT HAND  Final   Special Requests AEROBIC BOTTLE ONLY Blood Culture adequate volume  Final   Culture   Final    NO GROWTH 4 DAYS Performed at [facility], [address] [place], [place] [address]    Report Status PENDING  Incomplete     Time coordinating discharge: 35 minutes  SIGNED:   Glade Lloyd, MD  [facility] 05/18/2023, 7:26 AM

## 2023-05-19 ENCOUNTER — Encounter: Payer: Self-pay | Admitting: Adult Health

## 2023-05-19 ENCOUNTER — Non-Acute Institutional Stay (SKILLED_NURSING_FACILITY): Payer: Self-pay | Admitting: Adult Health

## 2023-05-19 DIAGNOSIS — M62551 Muscle wasting and atrophy, not elsewhere classified, right thigh: Secondary | ICD-10-CM | POA: Diagnosis not present

## 2023-05-19 DIAGNOSIS — D649 Anemia, unspecified: Secondary | ICD-10-CM | POA: Diagnosis not present

## 2023-05-19 DIAGNOSIS — I4891 Unspecified atrial fibrillation: Secondary | ICD-10-CM | POA: Diagnosis not present

## 2023-05-19 DIAGNOSIS — E039 Hypothyroidism, unspecified: Secondary | ICD-10-CM

## 2023-05-19 DIAGNOSIS — M05742 Rheumatoid arthritis with rheumatoid factor of left hand without organ or systems involvement: Secondary | ICD-10-CM

## 2023-05-19 DIAGNOSIS — K56609 Unspecified intestinal obstruction, unspecified as to partial versus complete obstruction: Secondary | ICD-10-CM

## 2023-05-19 DIAGNOSIS — F314 Bipolar disorder, current episode depressed, severe, without psychotic features: Secondary | ICD-10-CM

## 2023-05-19 DIAGNOSIS — I5031 Acute diastolic (congestive) heart failure: Secondary | ICD-10-CM | POA: Diagnosis not present

## 2023-05-19 DIAGNOSIS — N760 Acute vaginitis: Secondary | ICD-10-CM

## 2023-05-19 DIAGNOSIS — R2689 Other abnormalities of gait and mobility: Secondary | ICD-10-CM | POA: Diagnosis not present

## 2023-05-19 DIAGNOSIS — I4892 Unspecified atrial flutter: Secondary | ICD-10-CM

## 2023-05-19 DIAGNOSIS — R5381 Other malaise: Secondary | ICD-10-CM

## 2023-05-19 DIAGNOSIS — M05741 Rheumatoid arthritis with rheumatoid factor of right hand without organ or systems involvement: Secondary | ICD-10-CM

## 2023-05-19 DIAGNOSIS — M62552 Muscle wasting and atrophy, not elsewhere classified, left thigh: Secondary | ICD-10-CM | POA: Diagnosis not present

## 2023-05-19 NOTE — Progress Notes (Signed)
 Location:  Medical illustrator of Service:  SNF (31) Provider:   Peggye Ley, ANP [facility] [phone]   Aliene Beams, MD  Patient Care Team: Aliene Beams, MD as PCP - General (Family Medicine) Rollene Rotunda, MD as PCP - Cardiology (Cardiology) Milagros Evener, MD as Consulting Physician (Psychiatry) Carylon Perches, MD as Consulting Physician (Internal Medicine)  Extended Emergency Contact Information Primary Emergency Contact: Maple Mirza, [place] [address] [place] Home Phone: [phone] Mobile Phone: [phone] Relation: Son Secondary Emergency Contact: Milke,sandy Mobile Phone: [phone] Relation: Son  Code Status:  DNR Goals of care: Advanced Directive information    05/17/2023   10:00 AM  Advanced Directives  Does Patient Have a Medical Advance Directive? Yes  Type of Advance Directive Out of facility DNR (pink MOST or yellow form)  Does patient want to make changes to medical advance directive? No - Patient declined     No chief complaint on file.   HPI:  The patient is an 89 year old female who presents for follow-up after hospitalization for small bowel obstruction. Her daughter is at the bedside.   She was hospitalized from May 08, 2023, to May 18, 2023, due to a small bowel obstruction (SBO) which presented with nausea and vomiting. During her hospitalization, she experienced an aspiration event while attempting to pass an NG tube, leading to intubation and ICU admission. She was extubated on May 09, 2023, and placed on broad-spectrum antibiotics. Her SBO resolved, and she tolerated a regular diet with normal bowel function by May 18, 2023. No current abdominal pain and she is eating a regular diet with good bowel sounds.  Her hospital course was complicated by atrial fibrillation with rapid ventricular response (AFib with RVR), for which she was seen by cardiology and  started on amiodarone and Eliquis. She also experienced acute diastolic heart failure, requiring IV Lasix. Her 2D echo showed an ejection fraction of 70-75% with grade 1 diastolic dysfunction. She remains on two liters of oxygen with plans to wean.  Her recent lab work shows anemia with a hemoglobin of 9.6, down from a baseline of 13. Her ferritin is 300, iron is 27, TIBC is 147, and saturation is 18%.  She has a history of bipolar depression, for which she has received electroshock therapy and multiple medications. Her daughter reports that she has had depression for years and is followed by psychiatry. She wants comfort-based care and reports a reduced quality of life, particularly disliking her current living facility.  She has rheumatoid arthritis and Dupuytren's contracture, resulting in contractures in both hands, which limit her ability to perform tasks and engage in hobbies like sewing and crafts. She has worked with therapy but still has reduced range of motion.  She is experiencing vaginal irritation and has a history of recurrent UTIs, for which she takes macrodantin daily.  She previously lived in assisted living and is now recommended to receive therapy and skilled nursing care due to weakness.  She has a history of hypothyroidism, with a TSH of 4.6 and free T4 of 0.7 as of May 13, 2023.   The patient is an 89 year old female who presents for follow-up after hospitalization for small bowel obstruction. Her daughter is at the bedside.   She was hospitalized from May 08, 2023, to May 18, 2023, due to a small bowel obstruction (SBO) which presented with nausea and vomiting. During her hospitalization,  she experienced an aspiration event while attempting to pass an NG tube, leading to intubation and ICU admission. She was extubated on May 09, 2023, and placed on broad-spectrum antibiotics. Her SBO resolved, and she tolerated a regular diet with normal bowel function by May 18, 2023. No current abdominal pain and she is eating a regular diet with good bowel sounds.  Her hospital course was complicated by atrial fibrillation with rapid ventricular response (AFib with RVR), for which she was seen by cardiology and started on amiodarone and Eliquis. She also experienced acute diastolic heart failure, requiring IV Lasix. Her 2D echo showed an ejection fraction of 70-75% with grade 1 diastolic dysfunction. She remains on two liters of oxygen with plans to wean.  Her recent lab work shows anemia with a hemoglobin of 9.6, down from a baseline of 13. Her ferritin is 300, iron is 27, TIBC is 147, and saturation is 18%.  She has a history of bipolar depression, for which she has received electroshock therapy and multiple medications. Her daughter reports that she has had depression for years and is followed by psychiatry. She wants comfort-based care and reports a reduced quality of life, particularly disliking her current living facility.  She has rheumatoid arthritis and Dupuytren's contracture, resulting in contractures in both hands, which limit her ability to perform tasks and engage in hobbies like sewing and crafts. She has worked with therapy but still has reduced range of motion.  She is experiencing vaginal irritation and has a history of recurrent UTIs, for which she takes macrodantin daily.  She previously lived in assisted living and is now recommended to receive therapy and skilled nursing care due to weakness.  She has a history of hypothyroidism, with a TSH of 4.6 and free T4 of 0.7 as of May 13, 2023. Past Medical History:  Diagnosis Date   Anxiety    Arthritis    Bipolar disorder (HCC)    Depression    Hyperlipidemia    Hypothyroidism    Irritable bowel syndrome    RA (rheumatoid arthritis) (HCC)    Past Surgical History:  Procedure Laterality Date   ABDOMINAL HYSTERECTOMY     Bone Spurs  08/07/13   Right Shoulder   COLONOSCOPY     COLONOSCOPY N/A  05/08/2012   Procedure: COLONOSCOPY;  Surgeon: Malissa Hippo, MD;  Location: [facility];  Service: Endoscopy;  Laterality: N/A;  730   COLOSTOMY     ESOPHAGOGASTRODUODENOSCOPY N/A 11/04/2013   Procedure: ESOPHAGOGASTRODUODENOSCOPY (EGD);  Surgeon: Malissa Hippo, MD;  Location: [facility];  Service: Endoscopy;  Laterality: N/A;  730   EYE SURGERY  cataract x 2   MALONEY DILATION N/A 11/04/2013   Procedure: MALONEY DILATION;  Surgeon: Malissa Hippo, MD;  Location: [facility];  Service: Endoscopy;  Laterality: N/A;   UPPER GASTROINTESTINAL ENDOSCOPY      Allergies  Allergen Reactions   Penicillins Itching and Rash   Hydrocodone Other (See Comments)    MAO-I ; cannot take medications.    Morphine And Codeine     Unable to take due to MAO-I   Morphine Sulfate Other (See Comments)   Sulfamethoxazole-Trimethoprim Nausea Only, Other (See Comments) and Nausea And Vomiting    And diarrhea.  Other Reaction(s): GI Intolerance    Outpatient Encounter Medications as of 05/19/2023  Medication Sig   acetaminophen (TYLENOL) 500 MG tablet Take 1,000 mg by mouth 3 (three) times daily as needed (joint pain).   amiodarone (  PACERONE) 200 MG tablet 200mg  bid till 05/20/23 then 200mg  daily   apixaban (ELIQUIS) 2.5 MG TABS tablet Take 1 tablet (2.5 mg total) by mouth 2 (two) times daily.   B Complex-C (B-COMPLEX WITH VITAMIN C) tablet Take 1 tablet by mouth in the morning.   benztropine (COGENTIN) 0.5 MG tablet Take 0.5 mg by mouth at bedtime.   Cholecalciferol (VITAMIN D) 50 MCG (2000 UT) tablet Take 2,000 Units by mouth in the morning.   cyanocobalamin (VITAMIN B12) 1000 MCG tablet Take 1,000 mcg by mouth in the morning.   docusate sodium (COLACE) 100 MG capsule Take 1 capsule (100 mg total) by mouth 2 (two) times daily.   L-Methylfolate-Algae (L-METHYLFOLATE FORTE) 15-90.314 MG CAPS Take 1 capsule by mouth in the morning.   levothyroxine (SYNTHROID) 88 MCG tablet Take 88 mcg by mouth daily  before breakfast.   LORazepam (ATIVAN) 0.5 MG tablet Take 1 tablet (0.5 mg total) by mouth 2 (two) times daily as needed for anxiety.   melatonin 5 MG TABS Take 5 mg by mouth at bedtime.   metoprolol succinate (TOPROL-XL) 25 MG 24 hr tablet Take 2 tablets (50 mg total) by mouth in the morning.   Multiple Vitamins-Minerals (MULTIVITAMIN WITH MINERALS) tablet Take 1 tablet by mouth in the morning.   nitrofurantoin (MACRODANTIN) 50 MG capsule Take 1 capsule (50 mg total) by mouth at bedtime.   pantoprazole (PROTONIX) 40 MG tablet Take 1 tablet (40 mg total) by mouth daily. Before dinner.   polyethylene glycol (MIRALAX / GLYCOLAX) 17 g packet Take 17 g by mouth daily.   REXULTI 1 MG TABS tablet Take 1 mg by mouth at bedtime.   venlafaxine XR (EFFEXOR-XR) 150 MG 24 hr capsule Take 150 mg by mouth in the morning.   No facility-administered encounter medications on file as of 05/19/2023.    Review of Systems  Constitutional:  Positive for activity change. Negative for appetite change, chills, diaphoresis, fatigue, fever and unexpected weight change.  HENT:  Negative for congestion.   Respiratory:  Negative for cough, shortness of breath and wheezing.   Cardiovascular:  Negative for chest pain, palpitations and leg swelling.  Gastrointestinal:  Negative for abdominal distention, abdominal pain, constipation and diarrhea.  Genitourinary:  Negative for decreased urine volume, difficulty urinating, dysuria, hematuria and urgency.       Vaginal irritation   Musculoskeletal:  Positive for gait problem. Negative for arthralgias, back pain, joint swelling and myalgias.  Neurological:  Positive for weakness. Negative for dizziness, tremors, seizures, syncope, facial asymmetry, speech difficulty, light-headedness, numbness and headaches.  Psychiatric/Behavioral:  Positive for dysphoric mood. Negative for agitation, behavioral problems and confusion.     Immunization History  Administered Date(s)  Administered   Moderna Sars-Covid-2 Vaccination 04/05/2019, 05/01/2019   Pneumococcal Conjugate-13 12/21/2016   Tdap 08/23/2012   Pertinent  Health Maintenance Due  Topic Date Due   INFLUENZA VACCINE  10/13/2022   DEXA SCAN  Completed      05/04/2020    8:30 AM 05/04/2020   10:49 PM 05/05/2020    8:00 AM 05/05/2020    8:54 PM 05/06/2020   12:00 PM  Fall Risk  (RETIRED) Patient Fall Risk Level High fall risk High fall risk High fall risk High fall risk High fall risk   Functional Status Survey:    Vitals:   05/19/23 1114  BP: 133/67  Pulse: 89  Resp: 14  Temp: 97.8 F (36.6 C)  SpO2: 95%   There is no height or  weight on file to calculate BMI. Physical Exam Vitals and nursing note reviewed.  Constitutional:      General: She is not in acute distress.    Appearance: She is not diaphoretic.  HENT:     Head: Normocephalic and atraumatic.     Nose: Nose normal.     Mouth/Throat:     Mouth: Mucous membranes are moist.     Pharynx: Oropharynx is clear.  Neck:     Vascular: No JVD.  Cardiovascular:     Rate and Rhythm: Normal rate and regular rhythm.     Heart sounds: No murmur heard. Pulmonary:     Effort: Pulmonary effort is normal. No respiratory distress.     Breath sounds: Normal breath sounds. No wheezing.  Abdominal:     General: Bowel sounds are normal. There is no distension.     Palpations: Abdomen is soft.     Tenderness: There is no abdominal tenderness. There is no right CVA tenderness or left CVA tenderness.  Musculoskeletal:     Right lower leg: No edema.     Left lower leg: No edema.     Comments: Contractures to both hands Ecchymosis to right arm and hand   Skin:    General: Skin is warm and dry.  Neurological:     Mental Status: She is alert and oriented to person, place, and time.  Psychiatric:        Mood and Affect: Mood normal.     Labs reviewed: Recent Labs    05/15/23 0335 05/16/23 0520 05/17/23 0525 05/18/23 0345  NA 139 137 138  135  K 3.6 4.4 4.2 4.1  CL 104 105 101 105  CO2 25 24 27 26   GLUCOSE 93 95 119* 110*  BUN 7* 5* 10 9  CREATININE 0.66 0.69 0.79 0.71  CALCIUM 7.8* 8.3* 8.7* 8.4*  MG 1.7 2.0 1.9 1.9  PHOS 2.0* 3.0 3.6  --    Recent Labs    05/08/23 2120 05/09/23 0747 05/13/23 0250 05/15/23 0335 05/16/23 0520 05/17/23 0525  AST 17 26  --   --   --   --   ALT 13 14  --   --   --   --   ALKPHOS 67 50  --   --   --   --   BILITOT 0.4 0.7  --   --   --   --   PROT 8.1 7.0  --   --   --   --   ALBUMIN 4.6 3.3*   < > 3.2* 3.0* 3.0*   < > = values in this interval not displayed.   Recent Labs    05/13/23 0250 05/14/23 0320 05/15/23 0335 05/16/23 0520 05/17/23 0525  WBC 11.6* 12.9* 9.3 10.7* 11.8*  NEUTROABS 8.5* 8.1* 7.0  --   --   HGB 11.5* 10.3* 8.8* 9.0* 9.6*  HCT 33.4* 30.1* 26.3* 27.6* 28.8*  MCV 94.4 95.3 96.3 97.9 97.3  PLT 120* 127* 141* 171 190   Lab Results  Component Value Date   TSH 4.600 (H) 05/13/2023   No results found for: "HGBA1C" No results found for: "CHOL", "HDL", "LDLCALC", "LDLDIRECT", "TRIG", "CHOLHDL"  Significant Diagnostic Results in last 30 days:  DG Abdomen 1 View Result Date: 05/09/2023 CLINICAL DATA:  Post intubation. Nausea, vomiting, and diarrhea for 2 days, small-bowel obstruction. EXAM: ABDOMEN - 1 VIEW COMPARISON:  05/09/2023, 05/08/2023. FINDINGS: Multiple loops of distended small bowel are present in the abdomen  measuring up to 4.8 cm in diameter. An enteric tube terminates in the stomach. Mild airspace disease is noted at the lung bases. There is levoscoliosis with degenerative changes in the lumbar spine. Surgical changes are present in the left lower quadrant. Excreted contrast is present in the urinary bladder. There is a small outpouching of the bladder on the right, possible diverticulum. IMPRESSION: 1. Slight interval worsening of distended loops of small bowel in the abdomen, compatible with small-bowel obstruction. 2. Mild airspace disease at the  lung bases, possible atelectasis or infiltrate. Electronically Signed   By: Thornell Sartorius M.D.   On: 05/09/2023 07:14   DG Chest Portable 1 View Result Date: 05/09/2023 CLINICAL DATA:  Intubation. EXAM: PORTABLE CHEST 1 VIEW COMPARISON:  05/01/2020 FINDINGS: Endotracheal tube with tip 2.5 cm above the carina. The enteric tube at least reaches the stomach. Indistinct airspace density in the lower lungs. No pleural effusion, pneumothorax, or collapse. Normal heart size and mediastinal contours. IMPRESSION: 1. Unremarkable hardware as described. 2. Bilateral pulmonary infiltrate/pneumonia. Electronically Signed   By: Tiburcio Pea M.D.   On: 05/09/2023 07:11   DG Abdomen 1 View Result Date: 05/09/2023 CLINICAL DATA:  NG tube placement EXAM: ABDOMEN - 1 VIEW COMPARISON:  CT abdomen/pelvis dated 05/08/2023 FINDINGS: Moderate hiatal hernia. Enteric tube terminates in the distal esophagus just above the level of the hiatal hernia. IMPRESSION: Enteric tube terminates in the distal esophagus just above the level of a moderate hiatal hernia. Advancement is recommended. Electronically Signed   By: Charline Bills M.D.   On: 05/09/2023 03:31   CT ABDOMEN PELVIS W CONTRAST Result Date: 05/09/2023 CLINICAL DATA:  Abdominal pain EXAM: CT ABDOMEN AND PELVIS WITH CONTRAST TECHNIQUE: Multidetector CT imaging of the abdomen and pelvis was performed using the standard protocol following bolus administration of intravenous contrast. RADIATION DOSE REDUCTION: This exam was performed according to the departmental dose-optimization program which includes automated exposure control, adjustment of the mA and/or kV according to patient size and/or use of iterative reconstruction technique. CONTRAST:  75mL OMNIPAQUE IOHEXOL 300 MG/ML  SOLN COMPARISON:  05/02/2020 FINDINGS: Lower chest: Large hiatal hernia.  No acute findings. Hepatobiliary: No focal hepatic abnormality. Gallbladder unremarkable. Pancreas: No focal abnormality or  ductal dilatation. Spleen: No focal abnormality.  Normal size. Adrenals/Urinary Tract: No adrenal abnormality. No focal renal abnormality. No stones or hydronephrosis. Urinary bladder is unremarkable. Stomach/Bowel: Dilated fluid-filled small bowel loops into the pelvis. Distal small bowel is decompressed. Findings compatible with distal small bowel obstruction. Large bowel unremarkable. Stomach is fluid-filled and distended. Vascular/Lymphatic: Aortoiliac atherosclerosis. No evidence of aneurysm or adenopathy. Reproductive: Prior hysterectomy.  No adnexal masses. Other: No free fluid or free air. Musculoskeletal: No acute bony abnormality. IMPRESSION: Distended fluid-filled stomach and small bowel into the pelvis. Distal small bowel decompressed. Findings compatible with distal small bowel obstruction. Large hiatal hernia, fluid-filled. Electronically Signed   By: Charlett Nose M.D.   On: 05/09/2023 00:46    Assessment/Plan Small Bowel Obstruction (SBO) Resolved SBO after hospitalization. On miralax   Acute Diastolic Heart Failure Resolved acute diastolic heart failure. 2D echo showed EF 70-75% with grade 1 diastolic dysfunction. - Monitor daily weights at [facility]. - Monitor for signs of heart failure recurrence.  Atrial Fibrillation with Rapid Ventricular Response (RVR) AFib with RVR managed with amiodarone and Eliquis. Requires cardiology follow-up. - Continue amiodarone and Eliquis. - Continue metoprolol. - Follow up with cardiology.  Anemia Anemia with hemoglobin 9.6, iron deficiency suggested by iron studies. - Recheck CBC  in one week.  Hypothyroidism Hypothyroidism with TSH 4.6, Free T4 0.7. - Continue levothyroxine.  Bipolar Disorder Chronic bipolar disorder affecting rehabilitation.  Does not have motivation to progress which will affect her progress. Not suicidal but not wanting to continue living at [facility]  Vaginitis Experiencing vaginal irritation. - Order Diflucan  150 mg times 1. - If symptoms persist, obtain UA.  Dupuytren's Contracture and Rheumatoid Arthritis Contractures in both hands reducing range of motion, affecting quality of life.  GERD and Hiatal Hernia On PPI for GERD and hiatal hernia. Monitoring O2 sats and weights, aiming to wean from 2L oxygen. - Continue PPI for GERD and hiatal hernia. - Monitor O2 sats and attempt to wean from 2L oxygen.  Goals of Care Desires comfort-based care, has a DNR. Discussion of implementing a MOST form for no hospitalization. - Implement MOST form if family decides on no hospitalization.  Follow-up Follow-up plans include monitoring various conditions and reassessing treatment efficacy. - Recheck CBC and BMP in one week.

## 2023-05-20 DIAGNOSIS — R278 Other lack of coordination: Secondary | ICD-10-CM | POA: Diagnosis not present

## 2023-05-20 DIAGNOSIS — K56699 Other intestinal obstruction unspecified as to partial versus complete obstruction: Secondary | ICD-10-CM | POA: Diagnosis not present

## 2023-05-20 DIAGNOSIS — I5031 Acute diastolic (congestive) heart failure: Secondary | ICD-10-CM | POA: Diagnosis not present

## 2023-05-20 DIAGNOSIS — R531 Weakness: Secondary | ICD-10-CM | POA: Diagnosis not present

## 2023-05-22 ENCOUNTER — Non-Acute Institutional Stay (SKILLED_NURSING_FACILITY): Payer: Self-pay | Admitting: Internal Medicine

## 2023-05-22 DIAGNOSIS — R2689 Other abnormalities of gait and mobility: Secondary | ICD-10-CM | POA: Diagnosis not present

## 2023-05-22 DIAGNOSIS — R278 Other lack of coordination: Secondary | ICD-10-CM | POA: Diagnosis not present

## 2023-05-22 DIAGNOSIS — M05741 Rheumatoid arthritis with rheumatoid factor of right hand without organ or systems involvement: Secondary | ICD-10-CM

## 2023-05-22 DIAGNOSIS — K56609 Unspecified intestinal obstruction, unspecified as to partial versus complete obstruction: Secondary | ICD-10-CM

## 2023-05-22 DIAGNOSIS — I4891 Unspecified atrial fibrillation: Secondary | ICD-10-CM | POA: Diagnosis not present

## 2023-05-22 DIAGNOSIS — F314 Bipolar disorder, current episode depressed, severe, without psychotic features: Secondary | ICD-10-CM

## 2023-05-22 DIAGNOSIS — K56699 Other intestinal obstruction unspecified as to partial versus complete obstruction: Secondary | ICD-10-CM | POA: Diagnosis not present

## 2023-05-22 DIAGNOSIS — I5031 Acute diastolic (congestive) heart failure: Secondary | ICD-10-CM | POA: Diagnosis not present

## 2023-05-22 DIAGNOSIS — I4892 Unspecified atrial flutter: Secondary | ICD-10-CM

## 2023-05-22 DIAGNOSIS — D649 Anemia, unspecified: Secondary | ICD-10-CM

## 2023-05-22 DIAGNOSIS — M62551 Muscle wasting and atrophy, not elsewhere classified, right thigh: Secondary | ICD-10-CM | POA: Diagnosis not present

## 2023-05-22 DIAGNOSIS — E039 Hypothyroidism, unspecified: Secondary | ICD-10-CM

## 2023-05-22 DIAGNOSIS — R531 Weakness: Secondary | ICD-10-CM | POA: Diagnosis not present

## 2023-05-22 DIAGNOSIS — M05742 Rheumatoid arthritis with rheumatoid factor of left hand without organ or systems involvement: Secondary | ICD-10-CM

## 2023-05-22 DIAGNOSIS — R300 Dysuria: Secondary | ICD-10-CM | POA: Diagnosis not present

## 2023-05-22 DIAGNOSIS — M62552 Muscle wasting and atrophy, not elsewhere classified, left thigh: Secondary | ICD-10-CM | POA: Diagnosis not present

## 2023-05-22 NOTE — Progress Notes (Unsigned)
 Provider:   Location:  Medical illustrator of Service:  SNF ([id number])  PCP: Aliene Beams, MD Patient Care Team: Aliene Beams, MD as PCP - General (Family Medicine) Rollene Rotunda, MD as PCP - Cardiology (Cardiology) Milagros Evener, MD as Consulting Physician (Psychiatry) Carylon Perches, MD as Consulting Physician (Internal Medicine)  Extended Emergency Contact Information Primary Emergency Contact: Kristyn, [place], [place] [address] [place] of [place] Home Phone: [phone] Mobile Phone: [phone] Relation: Son Secondary Emergency Contact: Boniface,sandy Mobile Phone: [phone] Relation: Son  Code Status: DNR Goals of Care: Advanced Directive information    05/17/2023   10:00 AM  Advanced Directives  Does Patient Have a Medical Advance Directive? Yes  Type of Advance Directive Out of facility DNR (pink MOST or yellow form)  Does patient want to make changes to medical advance directive? No - Patient declined      Chief Complaint  Patient presents with   New Admit To SNF    HPI: Patient is a 89 y.o. female seen today for admission to SNF  Was admitted in the hospital from 2/24 to 3/6 for SBO and acute hypoxic respiratory failure  [facility]  Has Past history of Rheumatoid Arthritis and due to trends contracture in both her hands which limits her ability to perform daily task History of hypothyroidism, recurrent UTI, IBS, bipolar with depression seeing Dr. Jasmine Awe She was sent to the hospital for nausea vomiting and diarrhea.  In the hospital was found to have SBO on CT Abdomen .  During passing NGT led to aspiration event and patient had to be intubated. Treated with Flagyl and Rocephin Extubated on 05/09/2023  She went in to A Fib with RVR IV Amio started  TTE showed EF of 75% with No regional Wall changes Started on amiodarone and Eliquis also on metoprolol  She was discharged on Oxygen 1- 2L Doing  well Eating Regular diet and ate Breakfast Bowels moved Well yesterday  But she developed Urinary retention yesterday Could Not go to bathroom yesterday and had Foley placed Continues to need ADL care [place] for transfers Have cogntive impairnment     Past Medical History:  Diagnosis Date   Anxiety    Arthritis    Bipolar disorder (HCC)    Depression    Hyperlipidemia    Hypothyroidism    Irritable bowel syndrome    RA (rheumatoid arthritis) (HCC)    Past Surgical History:  Procedure Laterality Date   ABDOMINAL HYSTERECTOMY     Bone Spurs  08/07/13   Right Shoulder   COLONOSCOPY     COLONOSCOPY N/A 05/08/2012   Procedure: COLONOSCOPY;  Surgeon: Malissa Hippo, MD;  [facility]: [facility];  Service: Endoscopy;  Laterality: N/A;  730   COLOSTOMY     ESOPHAGOGASTRODUODENOSCOPY N/A 11/04/2013   Procedure: ESOPHAGOGASTRODUODENOSCOPY (EGD);  Surgeon: Malissa Hippo, MD;  Location: [facility];  Service: Endoscopy;  Laterality: N/A;  730   EYE SURGERY  cataract x 2   MALONEY DILATION N/A 11/04/2013   Procedure: MALONEY DILATION;  Surgeon: Malissa Hippo, MD;  Location: [facility];  Service: Endoscopy;  Laterality: N/A;   UPPER GASTROINTESTINAL ENDOSCOPY      reports that she quit smoking about 42 years ago. Her smoking use included cigarettes. She started smoking about 69 years ago. She has a 40.5 pack-year smoking history. She quit smokeless tobacco use about 42  years ago. She reports current alcohol use of about 1.0 standard drink of alcohol per week. She reports that she does not use drugs. Social History   Socioeconomic History   Marital status: Widowed    Spouse name: Not on file   Number of children: 3   Years of education: Not on file   Highest education level: Not on file  Occupational History   Not on file  Tobacco Use   Smoking status: Former    Current packs/day: 0.00    Average packs/day: 1.5 packs/day for 27.0 years (40.5 ttl pk-yrs)     Types: Cigarettes    Start date: 04/18/1954    Quit date: 04/18/1981    Years since quitting: 42.1   Smokeless tobacco: Former    Quit date: 11/27/1980  Vaping Use   Vaping status: Never Used  Substance and Sexual Activity   Alcohol use: Yes    Alcohol/week: 1.0 standard drink of alcohol    Types: 1 Glasses of wine per week   Drug use: No   Sexual activity: Not Currently    Partners: Male    Birth control/protection: Post-menopausal, Surgical    Comment: hyst  Other Topics Concern   Not on file  Social History Narrative   Lives at [facility] in assisted living.     Social Drivers of Corporate investment banker Strain: Low Risk  (02/13/2020)   Received from [facility], [facility]   Overall Financial Resource Strain (CARDIA)    Difficulty of Paying Living Expenses: Not hard at all  Food Insecurity: No Food Insecurity (05/14/2023)   Hunger Vital Sign    Worried About Running Out of Food in the Last Year: Never true    Ran Out of Food in the Last Year: Never true  Transportation Needs: No Transportation Needs (05/17/2023)   PRAPARE - Administrator, Civil Service (Medical): No    Lack of Transportation (Non-Medical): No  Physical Activity: Not on file  Stress: Stress Concern Present (02/13/2020)   Received from [facility], [facility] of Occupational Health - Occupational Stress Questionnaire    Feeling of Stress : To some extent  Social Connections: Socially Isolated (05/17/2023)   Social Connection and Isolation Panel [NHANES]    Frequency of Communication with Friends and Family: Once a week    Frequency of Social Gatherings with Friends and Family: Once a week    Attends Religious Services: More than 4 times per year    Active Member of [employer] or Organizations: No    Attends Banker Meetings: Never    Marital Status: Widowed  Intimate Partner Violence: Unknown (05/14/2023)   Humiliation, Afraid, Rape, and Kick questionnaire     Fear of Current or Ex-Partner: No    Emotionally Abused: Patient declined    Physically Abused: No    Sexually Abused: Patient declined    Functional Status Survey:    Family History  Problem Relation Age of Onset   Colon cancer Mother    Bipolar disorder Mother    Anxiety disorder Mother    Atrial fibrillation Son    Depression Maternal Grandmother     Health Maintenance  Topic Date Due   Zoster Vaccines- Shingrix (1 of 2) Never done   COVID-19 Vaccine (4 - 2024-25 season) 02/28/2023   Medicare Annual Wellness (AWV)  01/31/2024   DTaP/Tdap/Td (3 - Td or Tdap) 07/12/2032   Pneumonia Vaccine 35+ Years old  Completed  INFLUENZA VACCINE  Completed   DEXA SCAN  Completed   HPV VACCINES  Aged Out    Allergies  Allergen Reactions   Penicillins Itching and Rash   Hydrocodone Other (See Comments)    MAO-I ; cannot take medications.    Morphine And Codeine     Unable to take due to MAO-I   Morphine Sulfate Other (See Comments)   Sulfamethoxazole-Trimethoprim Nausea Only, Other (See Comments) and Nausea And Vomiting    And diarrhea.  Other Reaction(s): GI Intolerance    Outpatient Encounter Medications as of 05/22/2023  Medication Sig   acetaminophen (TYLENOL) 500 MG tablet Take 1,000 mg by mouth 3 (three) times daily as needed (joint pain).   amiodarone (PACERONE) 200 MG tablet 200mg  bid till 05/20/23 then 200mg  daily   apixaban (ELIQUIS) 2.5 MG TABS tablet Take 1 tablet (2.5 mg total) by mouth 2 (two) times daily.   B Complex-C (B-COMPLEX WITH VITAMIN C) tablet Take 1 tablet by mouth in the morning.   benztropine (COGENTIN) 0.5 MG tablet Take 0.5 mg by mouth at bedtime.   Cholecalciferol (VITAMIN D) 50 MCG (2000 UT) tablet Take 2,000 Units by mouth in the morning.   cyanocobalamin (VITAMIN B12) 1000 MCG tablet Take 1,000 mcg by mouth in the morning.   docusate sodium (COLACE) 100 MG capsule Take 1 capsule (100 mg total) by mouth 2 (two) times daily.   L-Methylfolate-Algae  (L-METHYLFOLATE FORTE) 15-90.314 MG CAPS Take 1 capsule by mouth in the morning.   levothyroxine (SYNTHROID) 88 MCG tablet Take 88 mcg by mouth daily before breakfast.   LORazepam (ATIVAN) 0.5 MG tablet Take 1 tablet (0.5 mg total) by mouth 2 (two) times daily as needed for anxiety.   melatonin 5 MG TABS Take 5 mg by mouth at bedtime.   metoprolol succinate (TOPROL-XL) 25 MG 24 hr tablet Take 2 tablets (50 mg total) by mouth in the morning.   Multiple Vitamins-Minerals (MULTIVITAMIN WITH MINERALS) tablet Take 1 tablet by mouth in the morning.   nitrofurantoin (MACRODANTIN) 50 MG capsule Take 1 capsule (50 mg total) by mouth at bedtime.   pantoprazole (PROTONIX) 40 MG tablet Take 1 tablet (40 mg total) by mouth daily. Before dinner.   polyethylene glycol (MIRALAX / GLYCOLAX) 17 g packet Take 17 g by mouth daily.   REXULTI 1 MG TABS tablet Take 1 mg by mouth at bedtime.   venlafaxine XR (EFFEXOR-XR) 150 MG 24 hr capsule Take 150 mg by mouth in the morning.   No facility-administered encounter medications on file as of 05/22/2023.    Review of Systems  Constitutional:  Positive for activity change. Negative for appetite change.  HENT: Negative.    Respiratory:  Negative for cough and shortness of breath.   Cardiovascular:  Negative for leg swelling.  Gastrointestinal:  Negative for constipation.  Genitourinary:  Positive for difficulty urinating.  Musculoskeletal:  Positive for gait problem. Negative for arthralgias and myalgias.  Skin: Negative.   Neurological:  Positive for weakness. Negative for dizziness.  Psychiatric/Behavioral:  Positive for confusion. Negative for dysphoric mood and sleep disturbance.     There were no vitals filed for this visit. There is no height or weight on file to calculate BMI. Physical Exam Vitals reviewed.  Constitutional:      Appearance: Normal appearance.  HENT:     Head: Normocephalic.     Nose: Nose normal.     Mouth/Throat:     Mouth: Mucous  membranes are moist.  Pharynx: Oropharynx is clear.  Eyes:     Pupils: Pupils are equal, round, and reactive to light.  Cardiovascular:     Rate and Rhythm: Normal rate and regular rhythm.     Pulses: Normal pulses.     Heart sounds: Normal heart sounds. No murmur heard. Pulmonary:     Effort: Pulmonary effort is normal.     Breath sounds: Rales present.  Abdominal:     General: Abdomen is flat. Bowel sounds are normal.     Palpations: Abdomen is soft.  Musculoskeletal:        General: No swelling.     Cervical back: Neck supple.  Skin:    General: Skin is warm.  Neurological:     General: No focal deficit present.     Mental Status: She is alert.  Psychiatric:        Mood and Affect: Mood normal.        Thought Content: Thought content normal.     Labs reviewed: Basic Metabolic Panel: Recent Labs    05/15/23 0335 05/16/23 0520 05/17/23 0525 05/18/23 0345  NA 139 137 138 135  K 3.6 4.4 4.2 4.1  CL 104 105 101 105  CO2 25 24 27 26   GLUCOSE 93 95 119* 110*  BUN 7* 5* 10 9  CREATININE 0.66 0.69 0.79 0.71  CALCIUM 7.8* 8.3* 8.7* 8.4*  MG 1.7 2.0 1.9 1.9  PHOS 2.0* 3.0 3.6  --    Liver Function Tests: Recent Labs    05/08/23 2120 05/09/23 0747 05/13/23 0250 05/15/23 0335 05/16/23 0520 05/17/23 0525  AST 17 26  --   --   --   --   ALT 13 14  --   --   --   --   ALKPHOS 67 50  --   --   --   --   BILITOT 0.4 0.7  --   --   --   --   PROT 8.1 7.0  --   --   --   --   ALBUMIN 4.6 3.3*   < > 3.2* 3.0* 3.0*   < > = values in this interval not displayed.   Recent Labs    05/08/23 2120  LIPASE <10*   No results for input(s): "AMMONIA" in the last 8760 hours. CBC: Recent Labs    05/13/23 0250 05/14/23 0320 05/15/23 0335 05/16/23 0520 05/17/23 0525  WBC 11.6* 12.9* 9.3 10.7* 11.8*  NEUTROABS 8.5* 8.1* 7.0  --   --   HGB 11.5* 10.3* 8.8* 9.0* 9.6*  HCT 33.4* 30.1* 26.3* 27.6* 28.8*  MCV 94.4 95.3 96.3 97.9 97.3  PLT 120* 127* 141* 171 190    Cardiac Enzymes: No results for input(s): "CKTOTAL", "CKMB", "CKMBINDEX", "TROPONINI" in the last 8760 hours. BNP: Invalid input(s): "POCBNP" No results found for: "HGBA1C" Lab Results  Component Value Date   TSH 4.600 (H) 05/13/2023   Lab Results  Component Value Date   VITAMINB12 1,401 (H) 05/16/2023   Lab Results  Component Value Date   FOLATE >40.0 05/16/2023   Lab Results  Component Value Date   IRON 27 (L) 05/16/2023   TIBC 147 (L) 05/16/2023   FERRITIN 300 05/16/2023    Imaging and Procedures obtained prior to SNF admission: DG Abdomen 1 View Result Date: 05/09/2023 CLINICAL DATA:  Post intubation. Nausea, vomiting, and diarrhea for 2 days, small-bowel obstruction. EXAM: ABDOMEN - 1 VIEW COMPARISON:  05/09/2023, 05/08/2023. FINDINGS: Multiple loops of distended small bowel  are present in the abdomen measuring up to 4.8 cm in diameter. An enteric tube terminates in the stomach. Mild airspace disease is noted at the lung bases. There is levoscoliosis with degenerative changes in the lumbar spine. Surgical changes are present in the left lower quadrant. Excreted contrast is present in the urinary bladder. There is a small outpouching of the bladder on the right, possible diverticulum. IMPRESSION: 1. Slight interval worsening of distended loops of small bowel in the abdomen, compatible with small-bowel obstruction. 2. Mild airspace disease at the lung bases, possible atelectasis or infiltrate. Electronically Signed   By: Thornell Sartorius M.D.   On: 05/09/2023 07:14   DG Chest Portable 1 View Result Date: 05/09/2023 CLINICAL DATA:  Intubation. EXAM: PORTABLE CHEST 1 VIEW COMPARISON:  05/01/2020 FINDINGS: Endotracheal tube with tip 2.5 cm above the carina. The enteric tube at least reaches the stomach. Indistinct airspace density in the lower lungs. No pleural effusion, pneumothorax, or collapse. Normal heart size and mediastinal contours. IMPRESSION: 1. Unremarkable hardware as  described. 2. Bilateral pulmonary infiltrate/pneumonia. Electronically Signed   By: Tiburcio Pea M.D.   On: 05/09/2023 07:11   DG Abdomen 1 View Result Date: 05/09/2023 CLINICAL DATA:  NG tube placement EXAM: ABDOMEN - 1 VIEW COMPARISON:  CT abdomen/pelvis dated 05/08/2023 FINDINGS: Moderate hiatal hernia. Enteric tube terminates in the distal esophagus just above the level of the hiatal hernia. IMPRESSION: Enteric tube terminates in the distal esophagus just above the level of a moderate hiatal hernia. Advancement is recommended. Electronically Signed   By: Charline Bills M.D.   On: 05/09/2023 03:31   CT ABDOMEN PELVIS W CONTRAST Result Date: 05/09/2023 CLINICAL DATA:  Abdominal pain EXAM: CT ABDOMEN AND PELVIS WITH CONTRAST TECHNIQUE: Multidetector CT imaging of the abdomen and pelvis was performed using the standard protocol following bolus administration of intravenous contrast. RADIATION DOSE REDUCTION: This exam was performed according to the departmental dose-optimization program which includes automated exposure control, adjustment of the mA and/or kV according to patient size and/or use of iterative reconstruction technique. CONTRAST:  75mL OMNIPAQUE IOHEXOL 300 MG/ML  SOLN COMPARISON:  05/02/2020 FINDINGS: Lower chest: Large hiatal hernia.  No acute findings. Hepatobiliary: No focal hepatic abnormality. Gallbladder unremarkable. Pancreas: No focal abnormality or ductal dilatation. Spleen: No focal abnormality.  Normal size. Adrenals/Urinary Tract: No adrenal abnormality. No focal renal abnormality. No stones or hydronephrosis. Urinary bladder is unremarkable. Stomach/Bowel: Dilated fluid-filled small bowel loops into the pelvis. Distal small bowel is decompressed. Findings compatible with distal small bowel obstruction. Large bowel unremarkable. Stomach is fluid-filled and distended. Vascular/Lymphatic: Aortoiliac atherosclerosis. No evidence of aneurysm or adenopathy. Reproductive: Prior  hysterectomy.  No adnexal masses. Other: No free fluid or free air. Musculoskeletal: No acute bony abnormality. IMPRESSION: Distended fluid-filled stomach and small bowel into the pelvis. Distal small bowel decompressed. Findings compatible with distal small bowel obstruction. Large hiatal hernia, fluid-filled. Electronically Signed   By: Charlett Nose M.D.   On: 05/09/2023 00:46    Assessment/Plan 1. SBO (small bowel obstruction) (HCC) (Primary) Symtoms Mostly resolved Eating and Bowels moving  2. Atrial fib/flutter, transient (HCC) On Amiodarone and Eliqui snoe  3. Anemia, unspecified type Chornic Disease Iron stores good  4. Acquired hypothyroidism TSH slighlty high will need follow up  5. Bipolar disorder with severe depression (HCC) Followed By Dr Donell Beers Effexor and Rexulti  6. Rheumatoid arthritis involving both hands with positive rheumatoid factor (HCC)   Recurent UTI GERD Pantoprazole    Family/ staff Communication:  Labs/tests ordered:

## 2023-05-23 ENCOUNTER — Encounter: Payer: Self-pay | Admitting: Internal Medicine

## 2023-05-23 DIAGNOSIS — I5031 Acute diastolic (congestive) heart failure: Secondary | ICD-10-CM | POA: Diagnosis not present

## 2023-05-23 DIAGNOSIS — K56699 Other intestinal obstruction unspecified as to partial versus complete obstruction: Secondary | ICD-10-CM | POA: Diagnosis not present

## 2023-05-23 DIAGNOSIS — M62552 Muscle wasting and atrophy, not elsewhere classified, left thigh: Secondary | ICD-10-CM | POA: Diagnosis not present

## 2023-05-23 DIAGNOSIS — R278 Other lack of coordination: Secondary | ICD-10-CM | POA: Diagnosis not present

## 2023-05-23 DIAGNOSIS — R2689 Other abnormalities of gait and mobility: Secondary | ICD-10-CM | POA: Diagnosis not present

## 2023-05-23 DIAGNOSIS — R531 Weakness: Secondary | ICD-10-CM | POA: Diagnosis not present

## 2023-05-23 DIAGNOSIS — M62551 Muscle wasting and atrophy, not elsewhere classified, right thigh: Secondary | ICD-10-CM | POA: Diagnosis not present

## 2023-05-24 DIAGNOSIS — M62552 Muscle wasting and atrophy, not elsewhere classified, left thigh: Secondary | ICD-10-CM | POA: Diagnosis not present

## 2023-05-24 DIAGNOSIS — R531 Weakness: Secondary | ICD-10-CM | POA: Diagnosis not present

## 2023-05-24 DIAGNOSIS — R278 Other lack of coordination: Secondary | ICD-10-CM | POA: Diagnosis not present

## 2023-05-24 DIAGNOSIS — M62551 Muscle wasting and atrophy, not elsewhere classified, right thigh: Secondary | ICD-10-CM | POA: Diagnosis not present

## 2023-05-24 DIAGNOSIS — R2689 Other abnormalities of gait and mobility: Secondary | ICD-10-CM | POA: Diagnosis not present

## 2023-05-24 DIAGNOSIS — K56699 Other intestinal obstruction unspecified as to partial versus complete obstruction: Secondary | ICD-10-CM | POA: Diagnosis not present

## 2023-05-24 DIAGNOSIS — I5031 Acute diastolic (congestive) heart failure: Secondary | ICD-10-CM | POA: Diagnosis not present

## 2023-05-25 DIAGNOSIS — I5031 Acute diastolic (congestive) heart failure: Secondary | ICD-10-CM | POA: Diagnosis not present

## 2023-05-25 DIAGNOSIS — R531 Weakness: Secondary | ICD-10-CM | POA: Diagnosis not present

## 2023-05-25 DIAGNOSIS — R2689 Other abnormalities of gait and mobility: Secondary | ICD-10-CM | POA: Diagnosis not present

## 2023-05-25 DIAGNOSIS — R194 Change in bowel habit: Secondary | ICD-10-CM | POA: Diagnosis not present

## 2023-05-25 DIAGNOSIS — M62551 Muscle wasting and atrophy, not elsewhere classified, right thigh: Secondary | ICD-10-CM | POA: Diagnosis not present

## 2023-05-25 DIAGNOSIS — M62552 Muscle wasting and atrophy, not elsewhere classified, left thigh: Secondary | ICD-10-CM | POA: Diagnosis not present

## 2023-05-25 DIAGNOSIS — R278 Other lack of coordination: Secondary | ICD-10-CM | POA: Diagnosis not present

## 2023-05-25 DIAGNOSIS — K56699 Other intestinal obstruction unspecified as to partial versus complete obstruction: Secondary | ICD-10-CM | POA: Diagnosis not present

## 2023-05-26 DIAGNOSIS — R2689 Other abnormalities of gait and mobility: Secondary | ICD-10-CM | POA: Diagnosis not present

## 2023-05-26 DIAGNOSIS — R278 Other lack of coordination: Secondary | ICD-10-CM | POA: Diagnosis not present

## 2023-05-26 DIAGNOSIS — M62552 Muscle wasting and atrophy, not elsewhere classified, left thigh: Secondary | ICD-10-CM | POA: Diagnosis not present

## 2023-05-26 DIAGNOSIS — R531 Weakness: Secondary | ICD-10-CM | POA: Diagnosis not present

## 2023-05-26 DIAGNOSIS — K56699 Other intestinal obstruction unspecified as to partial versus complete obstruction: Secondary | ICD-10-CM | POA: Diagnosis not present

## 2023-05-26 DIAGNOSIS — M62551 Muscle wasting and atrophy, not elsewhere classified, right thigh: Secondary | ICD-10-CM | POA: Diagnosis not present

## 2023-05-26 DIAGNOSIS — I5031 Acute diastolic (congestive) heart failure: Secondary | ICD-10-CM | POA: Diagnosis not present

## 2023-05-29 DIAGNOSIS — R2689 Other abnormalities of gait and mobility: Secondary | ICD-10-CM | POA: Diagnosis not present

## 2023-05-29 DIAGNOSIS — K56699 Other intestinal obstruction unspecified as to partial versus complete obstruction: Secondary | ICD-10-CM | POA: Diagnosis not present

## 2023-05-29 DIAGNOSIS — R531 Weakness: Secondary | ICD-10-CM | POA: Diagnosis not present

## 2023-05-29 DIAGNOSIS — R278 Other lack of coordination: Secondary | ICD-10-CM | POA: Diagnosis not present

## 2023-05-29 DIAGNOSIS — M62551 Muscle wasting and atrophy, not elsewhere classified, right thigh: Secondary | ICD-10-CM | POA: Diagnosis not present

## 2023-05-29 DIAGNOSIS — M62552 Muscle wasting and atrophy, not elsewhere classified, left thigh: Secondary | ICD-10-CM | POA: Diagnosis not present

## 2023-05-29 DIAGNOSIS — I5031 Acute diastolic (congestive) heart failure: Secondary | ICD-10-CM | POA: Diagnosis not present

## 2023-05-30 ENCOUNTER — Ambulatory Visit (HOSPITAL_COMMUNITY): Admitting: Internal Medicine

## 2023-05-30 DIAGNOSIS — M62551 Muscle wasting and atrophy, not elsewhere classified, right thigh: Secondary | ICD-10-CM | POA: Diagnosis not present

## 2023-05-30 DIAGNOSIS — R278 Other lack of coordination: Secondary | ICD-10-CM | POA: Diagnosis not present

## 2023-05-30 DIAGNOSIS — M62552 Muscle wasting and atrophy, not elsewhere classified, left thigh: Secondary | ICD-10-CM | POA: Diagnosis not present

## 2023-05-30 DIAGNOSIS — I5031 Acute diastolic (congestive) heart failure: Secondary | ICD-10-CM | POA: Diagnosis not present

## 2023-05-30 DIAGNOSIS — K56699 Other intestinal obstruction unspecified as to partial versus complete obstruction: Secondary | ICD-10-CM | POA: Diagnosis not present

## 2023-05-30 DIAGNOSIS — R2689 Other abnormalities of gait and mobility: Secondary | ICD-10-CM | POA: Diagnosis not present

## 2023-05-30 DIAGNOSIS — R531 Weakness: Secondary | ICD-10-CM | POA: Diagnosis not present

## 2023-05-31 DIAGNOSIS — M62551 Muscle wasting and atrophy, not elsewhere classified, right thigh: Secondary | ICD-10-CM | POA: Diagnosis not present

## 2023-05-31 DIAGNOSIS — R278 Other lack of coordination: Secondary | ICD-10-CM | POA: Diagnosis not present

## 2023-05-31 DIAGNOSIS — I5031 Acute diastolic (congestive) heart failure: Secondary | ICD-10-CM | POA: Diagnosis not present

## 2023-05-31 DIAGNOSIS — R2689 Other abnormalities of gait and mobility: Secondary | ICD-10-CM | POA: Diagnosis not present

## 2023-05-31 DIAGNOSIS — M62552 Muscle wasting and atrophy, not elsewhere classified, left thigh: Secondary | ICD-10-CM | POA: Diagnosis not present

## 2023-05-31 DIAGNOSIS — K56699 Other intestinal obstruction unspecified as to partial versus complete obstruction: Secondary | ICD-10-CM | POA: Diagnosis not present

## 2023-05-31 DIAGNOSIS — R531 Weakness: Secondary | ICD-10-CM | POA: Diagnosis not present

## 2023-06-01 DIAGNOSIS — R2689 Other abnormalities of gait and mobility: Secondary | ICD-10-CM | POA: Diagnosis not present

## 2023-06-01 DIAGNOSIS — M62552 Muscle wasting and atrophy, not elsewhere classified, left thigh: Secondary | ICD-10-CM | POA: Diagnosis not present

## 2023-06-01 DIAGNOSIS — M62551 Muscle wasting and atrophy, not elsewhere classified, right thigh: Secondary | ICD-10-CM | POA: Diagnosis not present

## 2023-06-01 DIAGNOSIS — I5031 Acute diastolic (congestive) heart failure: Secondary | ICD-10-CM | POA: Diagnosis not present

## 2023-06-01 DIAGNOSIS — R278 Other lack of coordination: Secondary | ICD-10-CM | POA: Diagnosis not present

## 2023-06-01 DIAGNOSIS — R531 Weakness: Secondary | ICD-10-CM | POA: Diagnosis not present

## 2023-06-01 DIAGNOSIS — K56699 Other intestinal obstruction unspecified as to partial versus complete obstruction: Secondary | ICD-10-CM | POA: Diagnosis not present

## 2023-06-02 DIAGNOSIS — R278 Other lack of coordination: Secondary | ICD-10-CM | POA: Diagnosis not present

## 2023-06-02 DIAGNOSIS — R2689 Other abnormalities of gait and mobility: Secondary | ICD-10-CM | POA: Diagnosis not present

## 2023-06-02 DIAGNOSIS — R531 Weakness: Secondary | ICD-10-CM | POA: Diagnosis not present

## 2023-06-02 DIAGNOSIS — K56699 Other intestinal obstruction unspecified as to partial versus complete obstruction: Secondary | ICD-10-CM | POA: Diagnosis not present

## 2023-06-02 DIAGNOSIS — M62551 Muscle wasting and atrophy, not elsewhere classified, right thigh: Secondary | ICD-10-CM | POA: Diagnosis not present

## 2023-06-02 DIAGNOSIS — M62552 Muscle wasting and atrophy, not elsewhere classified, left thigh: Secondary | ICD-10-CM | POA: Diagnosis not present

## 2023-06-02 DIAGNOSIS — I5031 Acute diastolic (congestive) heart failure: Secondary | ICD-10-CM | POA: Diagnosis not present

## 2023-06-05 DIAGNOSIS — R278 Other lack of coordination: Secondary | ICD-10-CM | POA: Diagnosis not present

## 2023-06-05 DIAGNOSIS — I5031 Acute diastolic (congestive) heart failure: Secondary | ICD-10-CM | POA: Diagnosis not present

## 2023-06-05 DIAGNOSIS — M62551 Muscle wasting and atrophy, not elsewhere classified, right thigh: Secondary | ICD-10-CM | POA: Diagnosis not present

## 2023-06-05 DIAGNOSIS — R531 Weakness: Secondary | ICD-10-CM | POA: Diagnosis not present

## 2023-06-05 DIAGNOSIS — R2689 Other abnormalities of gait and mobility: Secondary | ICD-10-CM | POA: Diagnosis not present

## 2023-06-05 DIAGNOSIS — K56699 Other intestinal obstruction unspecified as to partial versus complete obstruction: Secondary | ICD-10-CM | POA: Diagnosis not present

## 2023-06-05 DIAGNOSIS — M62552 Muscle wasting and atrophy, not elsewhere classified, left thigh: Secondary | ICD-10-CM | POA: Diagnosis not present

## 2023-06-06 ENCOUNTER — Ambulatory Visit (HOSPITAL_COMMUNITY)
Admission: RE | Admit: 2023-06-06 | Discharge: 2023-06-06 | Disposition: A | Discharge status: Home / Self Care | Source: Ambulatory Visit | Attending: Internal Medicine | Admitting: Internal Medicine

## 2023-06-06 VITALS — BP 142/80 | HR 110 | Ht 60.0 in | Wt 117.6 lb

## 2023-06-06 DIAGNOSIS — E039 Hypothyroidism, unspecified: Secondary | ICD-10-CM | POA: Diagnosis not present

## 2023-06-06 DIAGNOSIS — M62551 Muscle wasting and atrophy, not elsewhere classified, right thigh: Secondary | ICD-10-CM | POA: Diagnosis not present

## 2023-06-06 DIAGNOSIS — F319 Bipolar disorder, unspecified: Secondary | ICD-10-CM | POA: Insufficient documentation

## 2023-06-06 DIAGNOSIS — R531 Weakness: Secondary | ICD-10-CM | POA: Diagnosis not present

## 2023-06-06 DIAGNOSIS — E785 Hyperlipidemia, unspecified: Secondary | ICD-10-CM | POA: Insufficient documentation

## 2023-06-06 DIAGNOSIS — D649 Anemia, unspecified: Secondary | ICD-10-CM | POA: Diagnosis not present

## 2023-06-06 DIAGNOSIS — I480 Paroxysmal atrial fibrillation: Secondary | ICD-10-CM | POA: Diagnosis not present

## 2023-06-06 DIAGNOSIS — Z7901 Long term (current) use of anticoagulants: Secondary | ICD-10-CM | POA: Diagnosis not present

## 2023-06-06 DIAGNOSIS — I10 Essential (primary) hypertension: Secondary | ICD-10-CM | POA: Diagnosis not present

## 2023-06-06 DIAGNOSIS — Z5181 Encounter for therapeutic drug level monitoring: Secondary | ICD-10-CM | POA: Insufficient documentation

## 2023-06-06 DIAGNOSIS — D6869 Other thrombophilia: Secondary | ICD-10-CM | POA: Diagnosis not present

## 2023-06-06 DIAGNOSIS — R278 Other lack of coordination: Secondary | ICD-10-CM | POA: Diagnosis not present

## 2023-06-06 DIAGNOSIS — R2689 Other abnormalities of gait and mobility: Secondary | ICD-10-CM | POA: Diagnosis not present

## 2023-06-06 DIAGNOSIS — M069 Rheumatoid arthritis, unspecified: Secondary | ICD-10-CM | POA: Diagnosis not present

## 2023-06-06 DIAGNOSIS — K56699 Other intestinal obstruction unspecified as to partial versus complete obstruction: Secondary | ICD-10-CM | POA: Diagnosis not present

## 2023-06-06 DIAGNOSIS — Z79899 Other long term (current) drug therapy: Secondary | ICD-10-CM | POA: Insufficient documentation

## 2023-06-06 DIAGNOSIS — K56609 Unspecified intestinal obstruction, unspecified as to partial versus complete obstruction: Secondary | ICD-10-CM | POA: Diagnosis not present

## 2023-06-06 DIAGNOSIS — M62552 Muscle wasting and atrophy, not elsewhere classified, left thigh: Secondary | ICD-10-CM | POA: Diagnosis not present

## 2023-06-06 DIAGNOSIS — I5031 Acute diastolic (congestive) heart failure: Secondary | ICD-10-CM | POA: Diagnosis not present

## 2023-06-06 NOTE — Progress Notes (Signed)
 Primary Care Physician: Aliene Beams, MD Primary Cardiologist: Rollene Rotunda, MD Electrophysiologist: None     Referring Physician: Dr. Ashok Cordia is a 89 y.o. female with a history of bowel obstruction, anemia, HTN, RA, bipolar disorder, hypothyroidism, HLD, and paroxysmal atrial fibrillation who presents for consultation in the [facility]. Hospital admission 2/24-05/18/23 for SBO and aspiration pneumonia with course complicated by Afib with RVR. Discharged on amiodarone and OAC and Toprol 50 mg daily. Patient is on Eliquis 2.5 mg BID for a CHADS2VASC score of 4.  On evaluation today, she is currently in NSR. Patient notes to be nervous today. She loaded on amiodarone 200 mg BID until 3/8 and transitioned to 200 mg once daily. No bleeding issues on Eliquis 2.5 mg BID. She has felt okay since hospital discharge, currently undergoing rehab at [facility] getting strength back slowly.   Today, she denies symptoms of palpitations, chest pain, shortness of breath, orthopnea, PND, lower extremity edema, dizziness, presyncope, syncope, snoring, daytime somnolence, bleeding, or neurologic sequela. The patient is tolerating medications without difficulties and is otherwise without complaint today.   she has a BMI of Body mass index is 22.97 kg/m.Marland Kitchen Filed Weights   06/06/23 1039  Weight: 53.3 kg    Current Outpatient Medications  Medication Sig Dispense Refill   acetaminophen (TYLENOL) 500 MG tablet Take 1,000 mg by mouth 3 (three) times daily as needed (joint pain).     amiodarone (PACERONE) 200 MG tablet 200mg  bid till 05/20/23 then 200mg  daily 30 tablet 0   apixaban (ELIQUIS) 2.5 MG TABS tablet Take 1 tablet (2.5 mg total) by mouth 2 (two) times daily. 60 tablet 0   B Complex-C (B-COMPLEX WITH VITAMIN C) tablet Take 1 tablet by mouth in the morning.     benztropine (COGENTIN) 0.5 MG tablet Take 0.5 mg by mouth at bedtime.     Cholecalciferol  (VITAMIN D) 50 MCG (2000 UT) tablet Take 2,000 Units by mouth in the morning.     cyanocobalamin (VITAMIN B12) 1000 MCG tablet Take 1,000 mcg by mouth in the morning.     docusate sodium (COLACE) 100 MG capsule Take 1 capsule (100 mg total) by mouth 2 (two) times daily. 10 capsule 0   L-Methylfolate-Algae (L-METHYLFOLATE FORTE) 15-90.314 MG CAPS Take 1 capsule by mouth in the morning.     levothyroxine (SYNTHROID) 88 MCG tablet Take 88 mcg by mouth daily before breakfast.     LORazepam (ATIVAN) 0.5 MG tablet Take 1 tablet (0.5 mg total) by mouth 2 (two) times daily as needed for anxiety. 5 tablet 0   melatonin 5 MG TABS Take 5 mg by mouth at bedtime.     metoprolol succinate (TOPROL-XL) 25 MG 24 hr tablet Take 2 tablets (50 mg total) by mouth in the morning.     Multiple Vitamins-Minerals (MULTIVITAMIN WITH MINERALS) tablet Take 1 tablet by mouth in the morning.     nitrofurantoin (MACRODANTIN) 50 MG capsule Take 1 capsule (50 mg total) by mouth at bedtime. 90 capsule 3   pantoprazole (PROTONIX) 40 MG tablet Take 1 tablet (40 mg total) by mouth daily. Before dinner.     polyethylene glycol (MIRALAX / GLYCOLAX) 17 g packet Take 17 g by mouth daily. 14 each 0   REXULTI 1 MG TABS tablet Take 1 mg by mouth at bedtime.     venlafaxine XR (EFFEXOR-XR) 150 MG 24 hr capsule Take 150 mg by mouth in the morning.  No current facility-administered medications for this encounter.    Atrial Fibrillation Management history:  Previous antiarrhythmic drugs: amiodarone Previous cardioversions: none Previous ablations: none Anticoagulation history: Eliquis 2.5 mg BID   ROS- All systems are reviewed and negative except as per the HPI above.  Physical Exam: BP (!) 142/80   Pulse (!) 110   Ht 5' (1.524 m)   Wt 53.3 kg   BMI 22.97 kg/m   GEN: Well nourished, well developed in no acute distress NECK: No JVD; No carotid bruits CARDIAC: Regular tachycardic rate and rhythm, no murmurs, rubs,  gallops RESPIRATORY:  Clear to auscultation without rales, wheezing or rhonchi  ABDOMEN: Soft, non-tender, non-distended EXTREMITIES:  No edema; No deformity   EKG today demonstrates  Vent. rate 110 BPM PR interval 160 ms QRS duration 72 ms QT/QTcB 338/457 ms P-R-T axes 43 67 3 Sinus tachycardia T wave abnormality, consider inferior ischemia Abnormal ECG When compared with ECG of 10-May-2023 04:40, PREVIOUS ECG IS PRESENT  Echo 05/12/23 demonstrated  1. Left ventricular ejection fraction, by estimation, is 70 to 75%. Left  ventricular ejection fraction by PLAX is 75 %. The left ventricle has  hyperdynamic function. The left ventricle has no regional wall motion  abnormalities. Left ventricular  diastolic parameters are consistent with Grade I diastolic dysfunction  (impaired relaxation).   2. Right ventricular systolic function is normal. The right ventricular  size is normal. There is normal pulmonary artery systolic pressure. The  estimated right ventricular systolic pressure is 29.8 mmHg.   3. The mitral valve is normal in structure. Mild mitral valve  regurgitation. Moderate mitral annular calcification.   4. The aortic valve is tricuspid. Aortic valve regurgitation is not  visualized. Aortic valve sclerosis is present, with no evidence of aortic  valve stenosis.   5. The inferior vena cava is normal in size with greater than 50%  respiratory variability, suggesting right atrial pressure of 3 mmHg.   ASSESSMENT & PLAN CHA2DS2-VASc Score = 4  The patient's score is based upon: CHF History: 0 HTN History: 1 Diabetes History: 0 Stroke History: 0 Vascular Disease History: 0 Age Score: 2 Gender Score: 1       ASSESSMENT AND PLAN: Paroxysmal Atrial Fibrillation (ICD10:  I48.0) The patient's CHA2DS2-VASc score is 4, indicating a 4.8% annual risk of stroke.    She is currently in NSR. Patient notes she is nervous and after careful interpretation of ECG I do not see  indication of atrial flutter at this time; it does appear to be sinus tachycardia. We will continue with current rhythm control and plan to obtain surveillance labs at next visit.   Secondary Hypercoagulable State (ICD10:  D68.69) The patient is at significant risk for stroke/thromboembolism based upon her CHA2DS2-VASc Score of 4.  Continue Apixaban (Eliquis).  Continue Eliquis 2.5 mg BID.   High risk medication monitoring (ICD10: R7229428) Patient requires ongoing monitoring for anti-arrhythmic medication which has the potential to cause life threatening arrhythmias or AV block. Qtc stable. Continue amiodarone 200 mg daily.  She is on correct dosage of Eliquis 2.5 mg BID due to age and weight < 60 kg.    Follow up 3 months for amiodarone surveillance.   Lake Bells, PA-C  [facility] [address] [place], [place] [address] [phone]

## 2023-06-07 DIAGNOSIS — M62551 Muscle wasting and atrophy, not elsewhere classified, right thigh: Secondary | ICD-10-CM | POA: Diagnosis not present

## 2023-06-07 DIAGNOSIS — I5031 Acute diastolic (congestive) heart failure: Secondary | ICD-10-CM | POA: Diagnosis not present

## 2023-06-07 DIAGNOSIS — R531 Weakness: Secondary | ICD-10-CM | POA: Diagnosis not present

## 2023-06-07 DIAGNOSIS — R2689 Other abnormalities of gait and mobility: Secondary | ICD-10-CM | POA: Diagnosis not present

## 2023-06-07 DIAGNOSIS — K56699 Other intestinal obstruction unspecified as to partial versus complete obstruction: Secondary | ICD-10-CM | POA: Diagnosis not present

## 2023-06-07 DIAGNOSIS — M62552 Muscle wasting and atrophy, not elsewhere classified, left thigh: Secondary | ICD-10-CM | POA: Diagnosis not present

## 2023-06-07 DIAGNOSIS — R278 Other lack of coordination: Secondary | ICD-10-CM | POA: Diagnosis not present

## 2023-06-08 DIAGNOSIS — M62552 Muscle wasting and atrophy, not elsewhere classified, left thigh: Secondary | ICD-10-CM | POA: Diagnosis not present

## 2023-06-08 DIAGNOSIS — R278 Other lack of coordination: Secondary | ICD-10-CM | POA: Diagnosis not present

## 2023-06-08 DIAGNOSIS — I5031 Acute diastolic (congestive) heart failure: Secondary | ICD-10-CM | POA: Diagnosis not present

## 2023-06-08 DIAGNOSIS — M62551 Muscle wasting and atrophy, not elsewhere classified, right thigh: Secondary | ICD-10-CM | POA: Diagnosis not present

## 2023-06-08 DIAGNOSIS — R2689 Other abnormalities of gait and mobility: Secondary | ICD-10-CM | POA: Diagnosis not present

## 2023-06-08 DIAGNOSIS — R531 Weakness: Secondary | ICD-10-CM | POA: Diagnosis not present

## 2023-06-08 DIAGNOSIS — K56699 Other intestinal obstruction unspecified as to partial versus complete obstruction: Secondary | ICD-10-CM | POA: Diagnosis not present

## 2023-06-09 ENCOUNTER — Other Ambulatory Visit: Payer: Self-pay | Admitting: Adult Health

## 2023-06-09 DIAGNOSIS — R531 Weakness: Secondary | ICD-10-CM | POA: Diagnosis not present

## 2023-06-09 DIAGNOSIS — M62552 Muscle wasting and atrophy, not elsewhere classified, left thigh: Secondary | ICD-10-CM | POA: Diagnosis not present

## 2023-06-09 DIAGNOSIS — M62551 Muscle wasting and atrophy, not elsewhere classified, right thigh: Secondary | ICD-10-CM | POA: Diagnosis not present

## 2023-06-09 DIAGNOSIS — K56699 Other intestinal obstruction unspecified as to partial versus complete obstruction: Secondary | ICD-10-CM | POA: Diagnosis not present

## 2023-06-09 DIAGNOSIS — F419 Anxiety disorder, unspecified: Secondary | ICD-10-CM

## 2023-06-09 DIAGNOSIS — I5031 Acute diastolic (congestive) heart failure: Secondary | ICD-10-CM | POA: Diagnosis not present

## 2023-06-09 DIAGNOSIS — R278 Other lack of coordination: Secondary | ICD-10-CM | POA: Diagnosis not present

## 2023-06-09 DIAGNOSIS — R2689 Other abnormalities of gait and mobility: Secondary | ICD-10-CM | POA: Diagnosis not present

## 2023-06-09 MED ORDER — LORAZEPAM 0.5 MG PO TABS: 0.2500 mg | ORAL_TABLET | Freq: Two times a day (BID) | ORAL | 0 refills | Status: DC | PRN

## 2023-06-11 DIAGNOSIS — R278 Other lack of coordination: Secondary | ICD-10-CM | POA: Diagnosis not present

## 2023-06-11 DIAGNOSIS — K56699 Other intestinal obstruction unspecified as to partial versus complete obstruction: Secondary | ICD-10-CM | POA: Diagnosis not present

## 2023-06-11 DIAGNOSIS — I5031 Acute diastolic (congestive) heart failure: Secondary | ICD-10-CM | POA: Diagnosis not present

## 2023-06-11 DIAGNOSIS — R531 Weakness: Secondary | ICD-10-CM | POA: Diagnosis not present

## 2023-06-12 DIAGNOSIS — M62551 Muscle wasting and atrophy, not elsewhere classified, right thigh: Secondary | ICD-10-CM | POA: Diagnosis not present

## 2023-06-12 DIAGNOSIS — R2689 Other abnormalities of gait and mobility: Secondary | ICD-10-CM | POA: Diagnosis not present

## 2023-06-12 DIAGNOSIS — I5031 Acute diastolic (congestive) heart failure: Secondary | ICD-10-CM | POA: Diagnosis not present

## 2023-06-12 DIAGNOSIS — M62552 Muscle wasting and atrophy, not elsewhere classified, left thigh: Secondary | ICD-10-CM | POA: Diagnosis not present

## 2023-06-13 DIAGNOSIS — R531 Weakness: Secondary | ICD-10-CM | POA: Diagnosis not present

## 2023-06-13 DIAGNOSIS — I5031 Acute diastolic (congestive) heart failure: Secondary | ICD-10-CM | POA: Diagnosis not present

## 2023-06-13 DIAGNOSIS — R2689 Other abnormalities of gait and mobility: Secondary | ICD-10-CM | POA: Diagnosis not present

## 2023-06-13 DIAGNOSIS — R278 Other lack of coordination: Secondary | ICD-10-CM | POA: Diagnosis not present

## 2023-06-13 DIAGNOSIS — M62552 Muscle wasting and atrophy, not elsewhere classified, left thigh: Secondary | ICD-10-CM | POA: Diagnosis not present

## 2023-06-13 DIAGNOSIS — M62551 Muscle wasting and atrophy, not elsewhere classified, right thigh: Secondary | ICD-10-CM | POA: Diagnosis not present

## 2023-06-13 DIAGNOSIS — K56699 Other intestinal obstruction unspecified as to partial versus complete obstruction: Secondary | ICD-10-CM | POA: Diagnosis not present

## 2023-06-14 DIAGNOSIS — R531 Weakness: Secondary | ICD-10-CM | POA: Diagnosis not present

## 2023-06-14 DIAGNOSIS — M62551 Muscle wasting and atrophy, not elsewhere classified, right thigh: Secondary | ICD-10-CM | POA: Diagnosis not present

## 2023-06-14 DIAGNOSIS — I5031 Acute diastolic (congestive) heart failure: Secondary | ICD-10-CM | POA: Diagnosis not present

## 2023-06-14 DIAGNOSIS — R2689 Other abnormalities of gait and mobility: Secondary | ICD-10-CM | POA: Diagnosis not present

## 2023-06-14 DIAGNOSIS — R278 Other lack of coordination: Secondary | ICD-10-CM | POA: Diagnosis not present

## 2023-06-14 DIAGNOSIS — K56699 Other intestinal obstruction unspecified as to partial versus complete obstruction: Secondary | ICD-10-CM | POA: Diagnosis not present

## 2023-06-14 DIAGNOSIS — M62552 Muscle wasting and atrophy, not elsewhere classified, left thigh: Secondary | ICD-10-CM | POA: Diagnosis not present

## 2023-06-15 DIAGNOSIS — R531 Weakness: Secondary | ICD-10-CM | POA: Diagnosis not present

## 2023-06-15 DIAGNOSIS — M62552 Muscle wasting and atrophy, not elsewhere classified, left thigh: Secondary | ICD-10-CM | POA: Diagnosis not present

## 2023-06-15 DIAGNOSIS — K56699 Other intestinal obstruction unspecified as to partial versus complete obstruction: Secondary | ICD-10-CM | POA: Diagnosis not present

## 2023-06-15 DIAGNOSIS — R2689 Other abnormalities of gait and mobility: Secondary | ICD-10-CM | POA: Diagnosis not present

## 2023-06-15 DIAGNOSIS — M62551 Muscle wasting and atrophy, not elsewhere classified, right thigh: Secondary | ICD-10-CM | POA: Diagnosis not present

## 2023-06-15 DIAGNOSIS — I5031 Acute diastolic (congestive) heart failure: Secondary | ICD-10-CM | POA: Diagnosis not present

## 2023-06-15 DIAGNOSIS — R278 Other lack of coordination: Secondary | ICD-10-CM | POA: Diagnosis not present

## 2023-06-16 DIAGNOSIS — K56699 Other intestinal obstruction unspecified as to partial versus complete obstruction: Secondary | ICD-10-CM | POA: Diagnosis not present

## 2023-06-16 DIAGNOSIS — R531 Weakness: Secondary | ICD-10-CM | POA: Diagnosis not present

## 2023-06-16 DIAGNOSIS — R278 Other lack of coordination: Secondary | ICD-10-CM | POA: Diagnosis not present

## 2023-06-16 DIAGNOSIS — I5031 Acute diastolic (congestive) heart failure: Secondary | ICD-10-CM | POA: Diagnosis not present

## 2023-06-19 DIAGNOSIS — M62552 Muscle wasting and atrophy, not elsewhere classified, left thigh: Secondary | ICD-10-CM | POA: Diagnosis not present

## 2023-06-19 DIAGNOSIS — R2689 Other abnormalities of gait and mobility: Secondary | ICD-10-CM | POA: Diagnosis not present

## 2023-06-19 DIAGNOSIS — I5031 Acute diastolic (congestive) heart failure: Secondary | ICD-10-CM | POA: Diagnosis not present

## 2023-06-19 DIAGNOSIS — M62551 Muscle wasting and atrophy, not elsewhere classified, right thigh: Secondary | ICD-10-CM | POA: Diagnosis not present

## 2023-06-19 DIAGNOSIS — K56699 Other intestinal obstruction unspecified as to partial versus complete obstruction: Secondary | ICD-10-CM | POA: Diagnosis not present

## 2023-06-19 DIAGNOSIS — R531 Weakness: Secondary | ICD-10-CM | POA: Diagnosis not present

## 2023-06-19 DIAGNOSIS — R278 Other lack of coordination: Secondary | ICD-10-CM | POA: Diagnosis not present

## 2023-06-20 DIAGNOSIS — I5031 Acute diastolic (congestive) heart failure: Secondary | ICD-10-CM | POA: Diagnosis not present

## 2023-06-20 DIAGNOSIS — R2689 Other abnormalities of gait and mobility: Secondary | ICD-10-CM | POA: Diagnosis not present

## 2023-06-20 DIAGNOSIS — R278 Other lack of coordination: Secondary | ICD-10-CM | POA: Diagnosis not present

## 2023-06-20 DIAGNOSIS — K56699 Other intestinal obstruction unspecified as to partial versus complete obstruction: Secondary | ICD-10-CM | POA: Diagnosis not present

## 2023-06-20 DIAGNOSIS — R531 Weakness: Secondary | ICD-10-CM | POA: Diagnosis not present

## 2023-06-20 DIAGNOSIS — M62552 Muscle wasting and atrophy, not elsewhere classified, left thigh: Secondary | ICD-10-CM | POA: Diagnosis not present

## 2023-06-20 DIAGNOSIS — M62551 Muscle wasting and atrophy, not elsewhere classified, right thigh: Secondary | ICD-10-CM | POA: Diagnosis not present

## 2023-06-21 ENCOUNTER — Non-Acute Institutional Stay (SKILLED_NURSING_FACILITY): Payer: Self-pay | Admitting: Orthopedic Surgery

## 2023-06-21 ENCOUNTER — Encounter: Payer: Self-pay | Admitting: Orthopedic Surgery

## 2023-06-21 DIAGNOSIS — K56699 Other intestinal obstruction unspecified as to partial versus complete obstruction: Secondary | ICD-10-CM | POA: Diagnosis not present

## 2023-06-21 DIAGNOSIS — F314 Bipolar disorder, current episode depressed, severe, without psychotic features: Secondary | ICD-10-CM | POA: Diagnosis not present

## 2023-06-21 DIAGNOSIS — R531 Weakness: Secondary | ICD-10-CM | POA: Diagnosis not present

## 2023-06-21 DIAGNOSIS — R2689 Other abnormalities of gait and mobility: Secondary | ICD-10-CM | POA: Diagnosis not present

## 2023-06-21 DIAGNOSIS — E039 Hypothyroidism, unspecified: Secondary | ICD-10-CM | POA: Diagnosis not present

## 2023-06-21 DIAGNOSIS — K56609 Unspecified intestinal obstruction, unspecified as to partial versus complete obstruction: Secondary | ICD-10-CM

## 2023-06-21 DIAGNOSIS — M62552 Muscle wasting and atrophy, not elsewhere classified, left thigh: Secondary | ICD-10-CM | POA: Diagnosis not present

## 2023-06-21 DIAGNOSIS — I480 Paroxysmal atrial fibrillation: Secondary | ICD-10-CM | POA: Diagnosis not present

## 2023-06-21 DIAGNOSIS — M05742 Rheumatoid arthritis with rheumatoid factor of left hand without organ or systems involvement: Secondary | ICD-10-CM

## 2023-06-21 DIAGNOSIS — M62551 Muscle wasting and atrophy, not elsewhere classified, right thigh: Secondary | ICD-10-CM | POA: Diagnosis not present

## 2023-06-21 DIAGNOSIS — R4189 Other symptoms and signs involving cognitive functions and awareness: Secondary | ICD-10-CM

## 2023-06-21 DIAGNOSIS — D649 Anemia, unspecified: Secondary | ICD-10-CM

## 2023-06-21 DIAGNOSIS — N390 Urinary tract infection, site not specified: Secondary | ICD-10-CM

## 2023-06-21 DIAGNOSIS — R278 Other lack of coordination: Secondary | ICD-10-CM | POA: Diagnosis not present

## 2023-06-21 DIAGNOSIS — M05741 Rheumatoid arthritis with rheumatoid factor of right hand without organ or systems involvement: Secondary | ICD-10-CM

## 2023-06-21 DIAGNOSIS — I5031 Acute diastolic (congestive) heart failure: Secondary | ICD-10-CM | POA: Diagnosis not present

## 2023-06-21 NOTE — Progress Notes (Signed)
 Location:  [facility] Room Number: 152/A Place of Service:  SNF ([phone]) Provider:  Octavia Heir, NP   Aliene Beams, MD  Patient Care Team: Aliene Beams, MD as PCP - General (Family Medicine) Rollene Rotunda, MD as PCP - Cardiology (Cardiology) Milagros Evener, MD as Consulting Physician (Psychiatry) Carylon Perches, MD as Consulting Physician (Internal Medicine)  Extended Emergency Contact Information Primary Emergency Contact: Madalaine, [place], [place] [address] [address] Home Phone: [phone] Mobile Phone: [phone] Relation: Son Secondary Emergency Contact: Kallio,sandy Mobile Phone: [phone] Relation: Son  Code Status:  DNR Goals of care: Advanced Directive information    05/17/2023   10:00 AM  Advanced Directives  Does Patient Have a Medical Advance Directive? Yes  Type of Advance Directive Out of facility DNR (pink MOST or yellow form)  Does patient want to make changes to medical advance directive? No - Patient declined     Chief Complaint  Patient presents with   Discharge Note    HPI:  Pt is a 89 y.o. female seen today for discharge evaluation.   She currently resides on the rehab unit at [facility]. PMH: diastolic CHF, Afib, HTN, HLD, GERD, IBS, hypothyroidism, RA in hands, recurrent UTI, bipolar disorder, anemia, and unstable gait.   Hospitalized 02/24-03/06 due to SBO and acute hypoxic respiratory failure. During NGT placement she aspirated which eventually led to her being intubated. She was given Flagyl and Rocephin and was extubated 05/09/2023. She was discharged to rehab with 1-2 liter oxygen. In addition, she was found to be afib with RVR and IV amiodarone was started. TTE with EF 75%. HR improved with amiodarone, metoprolol and Eliquis. She was discharged to [facility] rehab for additional PT/OT/nursing services.   03/09 urinary retention, bladder scan 611 cc, foley was placed. 03/13 foley  discontinued and passed voiding trial. Remains on macrodantin for recurrent UTI. Advised to encourage hydration with water.   MMSE 26/30 05/27/2023.   03/25 seen by afib clinic, remains on amiodarone, metoprolol and eliquis.   Today, she is 1+ assistance with transfers. Uses rolator to ambulate. Off oxygen. LBM 04/07. She is taking miralax daily. She is voiding on her own. Appetite fair. She will discharge back to AL this afternoon with 24 hour home care. Afebrile. Vitals stable.    Past Medical History:  Diagnosis Date   Anxiety    Arthritis    Bipolar disorder (HCC)    Depression    Hyperlipidemia    Hypothyroidism    Irritable bowel syndrome    RA (rheumatoid arthritis) (HCC)    Past Surgical History:  Procedure Laterality Date   ABDOMINAL HYSTERECTOMY     Bone Spurs  08/07/13   Right Shoulder   COLONOSCOPY     COLONOSCOPY N/A 05/08/2012   Procedure: COLONOSCOPY;  Surgeon: Malissa Hippo, MD;  [facility]: [facility];  Service: Endoscopy;  Laterality: N/A;  730   COLOSTOMY     ESOPHAGOGASTRODUODENOSCOPY N/A 11/04/2013   Procedure: ESOPHAGOGASTRODUODENOSCOPY (EGD);  Surgeon: Malissa Hippo, MD;  Location: [facility];  Service: Endoscopy;  Laterality: N/A;  730   EYE SURGERY  cataract x 2   MALONEY DILATION N/A 11/04/2013   Procedure: MALONEY DILATION;  Surgeon: Malissa Hippo, MD;  Location: [facility];  Service: Endoscopy;  Laterality: N/A;   UPPER GASTROINTESTINAL ENDOSCOPY      Allergies  Allergen Reactions   Penicillins Itching and Rash   Hydrocodone  Other (See Comments)    MAO-I ; cannot take medications.    Morphine And Codeine     Unable to take due to MAO-I   Morphine Sulfate Other (See Comments)   Sulfamethoxazole-Trimethoprim Nausea Only, Other (See Comments) and Nausea And Vomiting    And diarrhea.  Other Reaction(s): GI Intolerance    Outpatient Encounter Medications as of 06/21/2023  Medication Sig   acetaminophen (TYLENOL) 500 MG tablet Take  1,000 mg by mouth 3 (three) times daily as needed (joint pain).   amiodarone (PACERONE) 200 MG tablet 200mg  bid till 05/20/23 then 200mg  daily   apixaban (ELIQUIS) 2.5 MG TABS tablet Take 1 tablet (2.5 mg total) by mouth 2 (two) times daily.   B Complex-C (B-COMPLEX WITH VITAMIN C) tablet Take 1 tablet by mouth in the morning.   benztropine (COGENTIN) 0.5 MG tablet Take 0.5 mg by mouth at bedtime.   Cholecalciferol (VITAMIN D) 50 MCG (2000 UT) tablet Take 2,000 Units by mouth in the morning.   cyanocobalamin (VITAMIN B12) 1000 MCG tablet Take 1,000 mcg by mouth in the morning.   docusate sodium (COLACE) 100 MG capsule Take 1 capsule (100 mg total) by mouth 2 (two) times daily.   L-Methylfolate-Algae (L-METHYLFOLATE FORTE) 15-90.314 MG CAPS Take 1 capsule by mouth in the morning.   levothyroxine (SYNTHROID) 88 MCG tablet Take 88 mcg by mouth daily before breakfast.   LORazepam (ATIVAN) 0.5 MG tablet Take 0.5 tablets (0.25 mg total) by mouth 2 (two) times daily as needed for anxiety.   melatonin 5 MG TABS Take 5 mg by mouth at bedtime.   metoprolol succinate (TOPROL-XL) 25 MG 24 hr tablet Take 2 tablets (50 mg total) by mouth in the morning.   Multiple Vitamins-Minerals (MULTIVITAMIN WITH MINERALS) tablet Take 1 tablet by mouth in the morning.   nitrofurantoin (MACRODANTIN) 50 MG capsule Take 1 capsule (50 mg total) by mouth at bedtime.   pantoprazole (PROTONIX) 40 MG tablet Take 1 tablet (40 mg total) by mouth daily. Before dinner.   polyethylene glycol (MIRALAX / GLYCOLAX) 17 g packet Take 17 g by mouth daily.   REXULTI 1 MG TABS tablet Take 1 mg by mouth at bedtime.   venlafaxine XR (EFFEXOR-XR) 150 MG 24 hr capsule Take 150 mg by mouth in the morning.   No facility-administered encounter medications on file as of 06/21/2023.    Review of Systems  Constitutional:  Negative for fatigue and fever.  HENT:  Negative for sore throat and trouble swallowing.   Respiratory:  Negative for cough,  shortness of breath and wheezing.   Cardiovascular:  Negative for chest pain and leg swelling.  Gastrointestinal:  Positive for constipation. Negative for abdominal distention, abdominal pain, diarrhea, nausea and vomiting.  Genitourinary:  Negative for dysuria, frequency, hematuria and vaginal bleeding.  Musculoskeletal:  Positive for arthralgias and gait problem.  Skin:  Negative for wound.  Neurological:  Positive for weakness. Negative for dizziness and headaches.  Psychiatric/Behavioral:  Positive for confusion and dysphoric mood. Negative for sleep disturbance. The patient is not nervous/anxious.     Immunization History  Administered Date(s) Administered   Influenza-Unspecified 01/03/2023   Moderna Covid-19 Vaccine Bivalent Booster 61yrs & up 01/03/2023   Moderna Sars-Covid-2 Vaccination 04/05/2019, 05/01/2019   Pneumococcal Conjugate-13 12/21/2016   Pneumococcal Polysaccharide-23 03/11/2020   Tdap 08/23/2012, 07/13/2022   Pertinent  Health Maintenance Due  Topic Date Due   INFLUENZA VACCINE  10/13/2023   DEXA SCAN  Completed  05/04/2020    8:30 AM 05/04/2020   10:49 PM 05/05/2020    8:00 AM 05/05/2020    8:54 PM 05/06/2020   12:00 PM  Fall Risk  (RETIRED) Patient Fall Risk Level High fall risk High fall risk High fall risk High fall risk High fall risk   Functional Status Survey:    Vitals:   06/21/23 1655  BP: 135/81  Pulse: 94  Resp: 16  Temp: 98.1 F (36.7 C)  SpO2: 94%  Weight: 116 lb (52.6 kg)  Height: 5' (1.524 m)   Body mass index is 22.65 kg/m. Physical Exam Vitals reviewed.  Constitutional:      General: She is not in acute distress. HENT:     Head: Normocephalic and atraumatic.  Eyes:     General:        Right eye: No discharge.        Left eye: No discharge.  Cardiovascular:     Rate and Rhythm: Normal rate and regular rhythm.     Pulses: Normal pulses.     Heart sounds: Normal heart sounds.  Pulmonary:     Effort: Pulmonary effort is  normal.     Breath sounds: Normal breath sounds.  Abdominal:     General: Bowel sounds are normal. There is no distension.     Palpations: Abdomen is soft.     Tenderness: There is no abdominal tenderness.  Musculoskeletal:     Cervical back: Neck supple.     Right lower leg: No edema.     Left lower leg: No edema.  Skin:    General: Skin is warm.     Capillary Refill: Capillary refill takes less than 2 seconds.  Neurological:     General: No focal deficit present.     Mental Status: She is alert. Mental status is at baseline.     Motor: Weakness present.     Gait: Gait abnormal.  Psychiatric:        Mood and Affect: Mood normal.     Labs reviewed: Recent Labs    05/15/23 0335 05/16/23 0520 05/17/23 0525 05/18/23 0345  NA 139 137 138 135  K 3.6 4.4 4.2 4.1  CL 104 105 101 105  CO2 25 24 27 26   GLUCOSE 93 95 119* 110*  BUN 7* 5* 10 9  CREATININE 0.66 0.69 0.79 0.71  CALCIUM 7.8* 8.3* 8.7* 8.4*  MG 1.7 2.0 1.9 1.9  PHOS 2.0* 3.0 3.6  --    Recent Labs    05/08/23 2120 05/09/23 0747 05/13/23 0250 05/15/23 0335 05/16/23 0520 05/17/23 0525  AST 17 26  --   --   --   --   ALT 13 14  --   --   --   --   ALKPHOS 67 50  --   --   --   --   BILITOT 0.4 0.7  --   --   --   --   PROT 8.1 7.0  --   --   --   --   ALBUMIN 4.6 3.3*   < > 3.2* 3.0* 3.0*   < > = values in this interval not displayed.   Recent Labs    05/13/23 0250 05/14/23 0320 05/15/23 0335 05/16/23 0520 05/17/23 0525  WBC 11.6* 12.9* 9.3 10.7* 11.8*  NEUTROABS 8.5* 8.1* 7.0  --   --   HGB 11.5* 10.3* 8.8* 9.0* 9.6*  HCT 33.4* 30.1* 26.3* 27.6* 28.8*  MCV 94.4 95.3  96.3 97.9 97.3  PLT 120* 127* 141* 171 190   Lab Results  Component Value Date   TSH 4.600 (H) 05/13/2023   No results found for: "HGBA1C" No results found for: "CHOL", "HDL", "LDLCALC", "LDLDIRECT", "TRIG", "CHOLHDL"  Significant Diagnostic Results in last 30 days:  No results found.  Assessment/Plan 1. SBO (small bowel  obstruction) (HCC) (Primary) - Hospitalized 02/24-03/06 - averaging BM every 1-2 days> last one 04/07 - abdomen soft, no N/V - appetite fair - cont miralax daily  2. Paroxysmal atrial fibrillation (HCC) - followed by afib clinic - HR< 100 - cont amiodarone, metoprolol and Eliquis  3. Acquired hypothyroidism - TSH 2.52 01/2023 - T4 0.70 05/16/2023 - cont levothyroxine  4. Bipolar disorder with severe depression (HCC) - no mood changes - followed by Dr. Donell Beers - cont Effexor and Rexulti  5. Anemia, unspecified type - hgb 9.6 05/17/2023 - cont pantoprazole  6. Recurrent UTI - cont macrodantin  7. Rheumatoid arthritis involving both hands with positive rheumatoid factor (HCC) - not on treatment  8. Cognitive impairment - MMSE 26/30 05/27/2023  Advised patient and nursing to schedule follow up in 2 weeks with Dr. Tracie Harrier.   Family/ staff Communication: plan discussed with patient and nurse  Labs/tests ordered:  none

## 2023-06-22 DIAGNOSIS — R41841 Cognitive communication deficit: Secondary | ICD-10-CM | POA: Diagnosis not present

## 2023-06-22 DIAGNOSIS — R2681 Unsteadiness on feet: Secondary | ICD-10-CM | POA: Diagnosis not present

## 2023-06-22 DIAGNOSIS — M47816 Spondylosis without myelopathy or radiculopathy, lumbar region: Secondary | ICD-10-CM | POA: Diagnosis not present

## 2023-06-23 DIAGNOSIS — R41841 Cognitive communication deficit: Secondary | ICD-10-CM | POA: Diagnosis not present

## 2023-06-23 DIAGNOSIS — R2681 Unsteadiness on feet: Secondary | ICD-10-CM | POA: Diagnosis not present

## 2023-06-23 DIAGNOSIS — M47816 Spondylosis without myelopathy or radiculopathy, lumbar region: Secondary | ICD-10-CM | POA: Diagnosis not present

## 2023-06-24 DIAGNOSIS — R41841 Cognitive communication deficit: Secondary | ICD-10-CM | POA: Diagnosis not present

## 2023-06-24 DIAGNOSIS — R2681 Unsteadiness on feet: Secondary | ICD-10-CM | POA: Diagnosis not present

## 2023-06-24 DIAGNOSIS — M47816 Spondylosis without myelopathy or radiculopathy, lumbar region: Secondary | ICD-10-CM | POA: Diagnosis not present

## 2023-06-27 DIAGNOSIS — M6281 Muscle weakness (generalized): Secondary | ICD-10-CM | POA: Diagnosis not present

## 2023-06-27 DIAGNOSIS — M47816 Spondylosis without myelopathy or radiculopathy, lumbar region: Secondary | ICD-10-CM | POA: Diagnosis not present

## 2023-06-27 DIAGNOSIS — R2681 Unsteadiness on feet: Secondary | ICD-10-CM | POA: Diagnosis not present

## 2023-06-27 DIAGNOSIS — Z7409 Other reduced mobility: Secondary | ICD-10-CM | POA: Diagnosis not present

## 2023-06-27 DIAGNOSIS — R41841 Cognitive communication deficit: Secondary | ICD-10-CM | POA: Diagnosis not present

## 2023-06-27 DIAGNOSIS — M05741 Rheumatoid arthritis with rheumatoid factor of right hand without organ or systems involvement: Secondary | ICD-10-CM | POA: Diagnosis not present

## 2023-06-28 DIAGNOSIS — R41841 Cognitive communication deficit: Secondary | ICD-10-CM | POA: Diagnosis not present

## 2023-06-28 DIAGNOSIS — R2681 Unsteadiness on feet: Secondary | ICD-10-CM | POA: Diagnosis not present

## 2023-06-28 DIAGNOSIS — M47816 Spondylosis without myelopathy or radiculopathy, lumbar region: Secondary | ICD-10-CM | POA: Diagnosis not present

## 2023-06-30 DIAGNOSIS — R2681 Unsteadiness on feet: Secondary | ICD-10-CM | POA: Diagnosis not present

## 2023-06-30 DIAGNOSIS — M47816 Spondylosis without myelopathy or radiculopathy, lumbar region: Secondary | ICD-10-CM | POA: Diagnosis not present

## 2023-06-30 DIAGNOSIS — R41841 Cognitive communication deficit: Secondary | ICD-10-CM | POA: Diagnosis not present

## 2023-07-03 DIAGNOSIS — M47816 Spondylosis without myelopathy or radiculopathy, lumbar region: Secondary | ICD-10-CM | POA: Diagnosis not present

## 2023-07-03 DIAGNOSIS — R2681 Unsteadiness on feet: Secondary | ICD-10-CM | POA: Diagnosis not present

## 2023-07-03 DIAGNOSIS — R41841 Cognitive communication deficit: Secondary | ICD-10-CM | POA: Diagnosis not present

## 2023-07-04 DIAGNOSIS — M47816 Spondylosis without myelopathy or radiculopathy, lumbar region: Secondary | ICD-10-CM | POA: Diagnosis not present

## 2023-07-04 DIAGNOSIS — R41841 Cognitive communication deficit: Secondary | ICD-10-CM | POA: Diagnosis not present

## 2023-07-04 DIAGNOSIS — R2681 Unsteadiness on feet: Secondary | ICD-10-CM | POA: Diagnosis not present

## 2023-07-05 DIAGNOSIS — R41841 Cognitive communication deficit: Secondary | ICD-10-CM | POA: Diagnosis not present

## 2023-07-05 DIAGNOSIS — R2681 Unsteadiness on feet: Secondary | ICD-10-CM | POA: Diagnosis not present

## 2023-07-05 DIAGNOSIS — M47816 Spondylosis without myelopathy or radiculopathy, lumbar region: Secondary | ICD-10-CM | POA: Diagnosis not present

## 2023-07-06 DIAGNOSIS — R2681 Unsteadiness on feet: Secondary | ICD-10-CM | POA: Diagnosis not present

## 2023-07-06 DIAGNOSIS — R41841 Cognitive communication deficit: Secondary | ICD-10-CM | POA: Diagnosis not present

## 2023-07-06 DIAGNOSIS — M47816 Spondylosis without myelopathy or radiculopathy, lumbar region: Secondary | ICD-10-CM | POA: Diagnosis not present

## 2023-07-07 DIAGNOSIS — Z7409 Other reduced mobility: Secondary | ICD-10-CM | POA: Diagnosis not present

## 2023-07-07 DIAGNOSIS — M05741 Rheumatoid arthritis with rheumatoid factor of right hand without organ or systems involvement: Secondary | ICD-10-CM | POA: Diagnosis not present

## 2023-07-07 DIAGNOSIS — M6281 Muscle weakness (generalized): Secondary | ICD-10-CM | POA: Diagnosis not present

## 2023-07-11 ENCOUNTER — Encounter: Payer: Self-pay | Admitting: Internal Medicine

## 2023-07-11 ENCOUNTER — Ambulatory Visit: Admitting: Internal Medicine

## 2023-07-11 VITALS — BP 120/80 | HR 88 | Temp 98.0°F | Resp 18 | Ht 60.0 in | Wt 120.0 lb

## 2023-07-11 DIAGNOSIS — F314 Bipolar disorder, current episode depressed, severe, without psychotic features: Secondary | ICD-10-CM

## 2023-07-11 DIAGNOSIS — M05742 Rheumatoid arthritis with rheumatoid factor of left hand without organ or systems involvement: Secondary | ICD-10-CM

## 2023-07-11 DIAGNOSIS — E039 Hypothyroidism, unspecified: Secondary | ICD-10-CM | POA: Diagnosis not present

## 2023-07-11 DIAGNOSIS — N390 Urinary tract infection, site not specified: Secondary | ICD-10-CM

## 2023-07-11 DIAGNOSIS — M47816 Spondylosis without myelopathy or radiculopathy, lumbar region: Secondary | ICD-10-CM | POA: Diagnosis not present

## 2023-07-11 DIAGNOSIS — R2681 Unsteadiness on feet: Secondary | ICD-10-CM | POA: Diagnosis not present

## 2023-07-11 DIAGNOSIS — R4189 Other symptoms and signs involving cognitive functions and awareness: Secondary | ICD-10-CM

## 2023-07-11 DIAGNOSIS — R41841 Cognitive communication deficit: Secondary | ICD-10-CM | POA: Diagnosis not present

## 2023-07-11 DIAGNOSIS — D649 Anemia, unspecified: Secondary | ICD-10-CM

## 2023-07-11 DIAGNOSIS — K56609 Unspecified intestinal obstruction, unspecified as to partial versus complete obstruction: Secondary | ICD-10-CM

## 2023-07-11 DIAGNOSIS — I480 Paroxysmal atrial fibrillation: Secondary | ICD-10-CM | POA: Diagnosis not present

## 2023-07-11 DIAGNOSIS — M05741 Rheumatoid arthritis with rheumatoid factor of right hand without organ or systems involvement: Secondary | ICD-10-CM

## 2023-07-12 DIAGNOSIS — M47816 Spondylosis without myelopathy or radiculopathy, lumbar region: Secondary | ICD-10-CM | POA: Diagnosis not present

## 2023-07-12 DIAGNOSIS — R41841 Cognitive communication deficit: Secondary | ICD-10-CM | POA: Diagnosis not present

## 2023-07-12 DIAGNOSIS — R2681 Unsteadiness on feet: Secondary | ICD-10-CM | POA: Diagnosis not present

## 2023-07-14 DIAGNOSIS — M47816 Spondylosis without myelopathy or radiculopathy, lumbar region: Secondary | ICD-10-CM | POA: Diagnosis not present

## 2023-07-14 DIAGNOSIS — R41841 Cognitive communication deficit: Secondary | ICD-10-CM | POA: Diagnosis not present

## 2023-07-14 DIAGNOSIS — R2681 Unsteadiness on feet: Secondary | ICD-10-CM | POA: Diagnosis not present

## 2023-07-15 ENCOUNTER — Encounter: Payer: Self-pay | Admitting: Internal Medicine

## 2023-07-15 DIAGNOSIS — Z7409 Other reduced mobility: Secondary | ICD-10-CM | POA: Diagnosis not present

## 2023-07-15 DIAGNOSIS — M05741 Rheumatoid arthritis with rheumatoid factor of right hand without organ or systems involvement: Secondary | ICD-10-CM | POA: Diagnosis not present

## 2023-07-15 DIAGNOSIS — M6281 Muscle weakness (generalized): Secondary | ICD-10-CM | POA: Diagnosis not present

## 2023-07-15 NOTE — Progress Notes (Signed)
 Location:   [facility]    Place of Service:   Clinic  Provider:   Code Status: DNR Goals of Care:     05/17/2023   10:00 AM  Advanced Directives  Does Patient Have a Medical Advance Directive? Yes  Type of Advance Directive Out of facility DNR (pink MOST or yellow form)  Does patient want to make changes to medical advance directive? No - Patient declined       HPI: Patient is a 89 y.o. female seen today for medical management of chronic diseases.   Came for Follow up after discharge back to AL from Rehab  Was admitted in the hospital from 2/24 to 3/6 for SBO and acute hypoxic respiratory failure   Has Past history of Rheumatoid Arthritis and contracture in both her hands which limits her ability to perform daily task History of hypothyroidism, recurrent UTI, IBS,  bipolar with depression seeing Dr. Plovski  Was in Hospital for SBO Treated Conservatively But developed Aspiration needing Intubation Also New diagnosis if A Fib and now is on Eliquis   She is now in AL with Caregivers Daughter was also there today  Discussed the use of AI scribe software for clinical note transcription with the patient, who gave verbal consent to proceed.  History of Present Illness   WALTER GREIG is an 89 year old female who presents after a fall. She is accompanied by her daughter, Concha Deed, and her caregiver, Tia.  She fell in the bathroom approximately an hour and a half prior to the visit while attempting to pull out a chair from under the vanity, landing on a bathroom scatter rug. Her caregiver, Tia, assisted her in getting up. She feels wobbly but has no current pain from the fall.  She experiences hand pain due to arthritis, affecting both hands similarly. There is no pain in her bottom following the fall.  She has atrial fibrillation and is on Eliquis  2.5 mg twice a day. There is concern about the risk of bleeding due to falls, but no bleeding issues have been experienced.  Her  medication regimen includes Miralax , macadamia, Cogentin , a quarter of an Ativan , melatonin, Rexulti , and Effexor . She uses a four-wheel walker for mobility and receives assistance with dressing, showering, and some bathroom activities. She resides in assisted living with 24-hour care.      ..   Past Medical History:  Diagnosis Date   Anxiety    Arthritis    Bipolar disorder (HCC)    Depression    Hyperlipidemia    Hypothyroidism    Irritable bowel syndrome    RA (rheumatoid arthritis) (HCC)     Past Surgical History:  Procedure Laterality Date   ABDOMINAL HYSTERECTOMY     Bone Spurs  08/07/13   Right Shoulder   COLONOSCOPY     COLONOSCOPY N/A 05/08/2012   Procedure: COLONOSCOPY;  Surgeon: Ruby Corporal, MD;  [facility]: [facility];  Service: Endoscopy;  Laterality: N/A;  730   COLOSTOMY     ESOPHAGOGASTRODUODENOSCOPY N/A 11/04/2013   Procedure: ESOPHAGOGASTRODUODENOSCOPY (EGD);  Surgeon: Ruby Corporal, MD;  [facility]: [facility];  Service: Endoscopy;  Laterality: N/A;  730   EYE SURGERY  cataract x 2   MALONEY DILATION N/A 11/04/2013   Procedure: MALONEY DILATION;  Surgeon: Ruby Corporal, MD;  [facility]: [facility];  Service: Endoscopy;  Laterality: N/A;   UPPER GASTROINTESTINAL ENDOSCOPY      Allergies  Allergen Reactions   Penicillins Itching and  Rash   Hydrocodone Other (See Comments)    MAO-I ; cannot take medications.    Morphine  And Codeine     Unable to take due to MAO-I   Morphine  Sulfate Other (See Comments)   Sulfamethoxazole-Trimethoprim Nausea Only, Other (See Comments) and Nausea And Vomiting    And diarrhea.  Other Reaction(s): GI Intolerance    Outpatient Encounter Medications as of 07/11/2023  Medication Sig   acetaminophen  (TYLENOL ) 500 MG tablet Take 1,000 mg by mouth 3 (three) times daily as needed (joint pain).   amiodarone  (PACERONE ) 200 MG tablet 200mg  bid till 05/20/23 then 200mg  daily   apixaban  (ELIQUIS ) 2.5 MG TABS tablet Take 1  tablet (2.5 mg total) by mouth 2 (two) times daily.   B Complex-C (B-COMPLEX WITH VITAMIN C) tablet Take 1 tablet by mouth in the morning.   benztropine  (COGENTIN ) 0.5 MG tablet Take 0.5 mg by mouth at bedtime.   Cholecalciferol (VITAMIN D ) 50 MCG (2000 UT) tablet Take 2,000 Units by mouth in the morning.   cyanocobalamin  (VITAMIN B12) 1000 MCG tablet Take 1,000 mcg by mouth in the morning.   docusate sodium  (COLACE) 100 MG capsule Take 1 capsule (100 mg total) by mouth 2 (two) times daily.   L-Methylfolate-Algae (L-METHYLFOLATE FORTE) 15-90.314 MG CAPS Take 1 capsule by mouth in the morning.   levothyroxine  (SYNTHROID ) 88 MCG tablet Take 88 mcg by mouth daily before breakfast.   LORazepam  (ATIVAN ) 0.5 MG tablet Take 0.5 tablets (0.25 mg total) by mouth 2 (two) times daily as needed for anxiety.   melatonin 5 MG TABS Take 5 mg by mouth at bedtime.   metoprolol  succinate (TOPROL -XL) 25 MG 24 hr tablet Take 2 tablets (50 mg total) by mouth in the morning.   Multiple Vitamins-Minerals (MULTIVITAMIN WITH MINERALS) tablet Take 1 tablet by mouth in the morning.   nitrofurantoin  (MACRODANTIN ) 50 MG capsule Take 1 capsule (50 mg total) by mouth at bedtime.   pantoprazole  (PROTONIX ) 40 MG tablet Take 1 tablet (40 mg total) by mouth daily. Before dinner.   polyethylene glycol (MIRALAX  / GLYCOLAX ) 17 g packet Take 17 g by mouth daily.   REXULTI  1 MG TABS tablet Take 1 mg by mouth at bedtime.   venlafaxine  XR (EFFEXOR -XR) 150 MG 24 hr capsule Take 150 mg by mouth in the morning.   No facility-administered encounter medications on file as of 07/11/2023.    Review of Systems:  Review of Systems  Health Maintenance  Topic Date Due   Zoster Vaccines- Shingrix (1 of 2) Never done   COVID-19 Vaccine (4 - 2024-25 season) 01/03/2024 (Originally 02/28/2023)   INFLUENZA VACCINE  10/13/2023   Medicare Annual Wellness (AWV)  01/31/2024   DTaP/Tdap/Td (3 - Td or Tdap) 07/12/2032   Pneumonia Vaccine 65+ Years  old  Completed   DEXA SCAN  Completed   HPV VACCINES  Aged Out   Meningococcal B Vaccine  Aged Out    Physical Exam: Vitals:   07/11/23 1445  BP: 120/80  Pulse: 88  Resp: 18  Temp: 98 F (36.7 C)  SpO2: 95%  Weight: 120 lb (54.4 kg)  Height: 5' (1.524 m)   Body mass index is 23.44 kg/m. Physical Exam  Labs reviewed: Basic Metabolic Panel: Recent Labs    05/13/23 0250 05/14/23 0320 05/15/23 0335 05/16/23 0520 05/17/23 0525 05/18/23 0345  NA 134*   < > 139 137 138 135  K 3.0*   < > 3.6 4.4 4.2 4.1  CL 96*   < >  104 105 101 105  CO2 21*   < > 25 24 27 26   GLUCOSE 240*   < > 93 95 119* 110*  BUN 10   < > 7* 5* 10 9  CREATININE 0.75   < > 0.66 0.69 0.79 0.71  CALCIUM 7.3*   < > 7.8* 8.3* 8.7* 8.4*  MG 1.5*   < > 1.7 2.0 1.9 1.9  PHOS  --    < > 2.0* 3.0 3.6  --   TSH 4.600*  --   --   --   --   --    < > = values in this interval not displayed.   Liver Function Tests: Recent Labs    05/08/23 2120 05/09/23 0747 05/13/23 0250 05/15/23 0335 05/16/23 0520 05/17/23 0525  AST 17 26  --   --   --   --   ALT 13 14  --   --   --   --   ALKPHOS 67 50  --   --   --   --   BILITOT 0.4 0.7  --   --   --   --   PROT 8.1 7.0  --   --   --   --   ALBUMIN  4.6 3.3*   < > 3.2* 3.0* 3.0*   < > = values in this interval not displayed.   Recent Labs    05/08/23 2120  LIPASE <10*   No results for input(s): "AMMONIA" in the last 8760 hours. CBC: Recent Labs    05/13/23 0250 05/14/23 0320 05/15/23 0335 05/16/23 0520 05/17/23 0525  WBC 11.6* 12.9* 9.3 10.7* 11.8*  NEUTROABS 8.5* 8.1* 7.0  --   --   HGB 11.5* 10.3* 8.8* 9.0* 9.6*  HCT 33.4* 30.1* 26.3* 27.6* 28.8*  MCV 94.4 95.3 96.3 97.9 97.3  PLT 120* 127* 141* 171 190   Lipid Panel: No results for input(s): "CHOL", "HDL", "LDLCALC", "TRIG", "CHOLHDL", "LDLDIRECT" in the last 8760 hours. No results found for: "HGBA1C"  Procedures since last visit: No results found.  Assessment/Plan   Assessment and  Plan    S/p Fall Recent fall in bathroom without injury.  - Ensure safety measures to prevent falls, including proper walker use and assistance.   Atrial Fibrillation New diagnosis Seeing in [facility] On Amiodarone  and metoprolol  Developed during previous hospitalization. On Eliquis  for stroke prevention. Discussed risks of discontinuation due to age and fall risk. - Continue Eliquis  2.5 mg twice daily.   Arthritis of the hands Chronic pain in hands, particularly in three fingers. No treatment plan due to age and decision against surgery.  Depression Managed with Effexor  and Rexulti . No significant symptoms, occasional down days.  Order written for her to see Dr Levie Ream here in [place]  History of bowel obstruction Previous obstruction with low recurrence risk. Advised to avoid constipation. - Continue Miralax  as needed to prevent constipation. - Monitor bowel movements and adjust Miralax  dosage.  Hiatal hernia Presence noted, no acute symptoms or treatment plan. Protonix   Recurrent UTI On Low dose of Macrodantin    Hypothyroidism Will Need Follow up of TSH  Rheumatoid arthritis involving both hands with positive rheumatoid factor (HCC) No Active Treatment Anemia, unspecified type Chronic Disease Iron stores good  Labs/tests ordered:  Labs Ordered Next appt:  10/16/2023

## 2023-07-17 DIAGNOSIS — M6281 Muscle weakness (generalized): Secondary | ICD-10-CM | POA: Diagnosis not present

## 2023-07-17 DIAGNOSIS — M05741 Rheumatoid arthritis with rheumatoid factor of right hand without organ or systems involvement: Secondary | ICD-10-CM | POA: Diagnosis not present

## 2023-07-17 DIAGNOSIS — Z7409 Other reduced mobility: Secondary | ICD-10-CM | POA: Diagnosis not present

## 2023-07-18 DIAGNOSIS — R41841 Cognitive communication deficit: Secondary | ICD-10-CM | POA: Diagnosis not present

## 2023-07-18 DIAGNOSIS — R2681 Unsteadiness on feet: Secondary | ICD-10-CM | POA: Diagnosis not present

## 2023-07-18 DIAGNOSIS — M47816 Spondylosis without myelopathy or radiculopathy, lumbar region: Secondary | ICD-10-CM | POA: Diagnosis not present

## 2023-07-19 DIAGNOSIS — Z7409 Other reduced mobility: Secondary | ICD-10-CM | POA: Diagnosis not present

## 2023-07-19 DIAGNOSIS — M6281 Muscle weakness (generalized): Secondary | ICD-10-CM | POA: Diagnosis not present

## 2023-07-19 DIAGNOSIS — R2681 Unsteadiness on feet: Secondary | ICD-10-CM | POA: Diagnosis not present

## 2023-07-19 DIAGNOSIS — M05741 Rheumatoid arthritis with rheumatoid factor of right hand without organ or systems involvement: Secondary | ICD-10-CM | POA: Diagnosis not present

## 2023-07-19 DIAGNOSIS — R41841 Cognitive communication deficit: Secondary | ICD-10-CM | POA: Diagnosis not present

## 2023-07-19 DIAGNOSIS — M47816 Spondylosis without myelopathy or radiculopathy, lumbar region: Secondary | ICD-10-CM | POA: Diagnosis not present

## 2023-07-20 DIAGNOSIS — Z7409 Other reduced mobility: Secondary | ICD-10-CM | POA: Diagnosis not present

## 2023-07-20 DIAGNOSIS — M05741 Rheumatoid arthritis with rheumatoid factor of right hand without organ or systems involvement: Secondary | ICD-10-CM | POA: Diagnosis not present

## 2023-07-20 DIAGNOSIS — R41841 Cognitive communication deficit: Secondary | ICD-10-CM | POA: Diagnosis not present

## 2023-07-20 DIAGNOSIS — M47816 Spondylosis without myelopathy or radiculopathy, lumbar region: Secondary | ICD-10-CM | POA: Diagnosis not present

## 2023-07-20 DIAGNOSIS — R2681 Unsteadiness on feet: Secondary | ICD-10-CM | POA: Diagnosis not present

## 2023-07-20 DIAGNOSIS — M6281 Muscle weakness (generalized): Secondary | ICD-10-CM | POA: Diagnosis not present

## 2023-07-21 ENCOUNTER — Other Ambulatory Visit: Payer: Self-pay | Admitting: Adult Health

## 2023-07-21 DIAGNOSIS — F419 Anxiety disorder, unspecified: Secondary | ICD-10-CM

## 2023-07-21 MED ORDER — LORAZEPAM 0.5 MG PO TABS: 0.2500 mg | ORAL_TABLET | Freq: Two times a day (BID) | ORAL | 2 refills | Status: DC | PRN

## 2023-07-25 DIAGNOSIS — R41841 Cognitive communication deficit: Secondary | ICD-10-CM | POA: Diagnosis not present

## 2023-07-25 DIAGNOSIS — R2681 Unsteadiness on feet: Secondary | ICD-10-CM | POA: Diagnosis not present

## 2023-07-25 DIAGNOSIS — M47816 Spondylosis without myelopathy or radiculopathy, lumbar region: Secondary | ICD-10-CM | POA: Diagnosis not present

## 2023-07-26 DIAGNOSIS — R41841 Cognitive communication deficit: Secondary | ICD-10-CM | POA: Diagnosis not present

## 2023-07-26 DIAGNOSIS — R2681 Unsteadiness on feet: Secondary | ICD-10-CM | POA: Diagnosis not present

## 2023-07-26 DIAGNOSIS — M47816 Spondylosis without myelopathy or radiculopathy, lumbar region: Secondary | ICD-10-CM | POA: Diagnosis not present

## 2023-07-27 ENCOUNTER — Non-Acute Institutional Stay: Payer: Self-pay | Admitting: Adult Health

## 2023-07-27 ENCOUNTER — Encounter: Payer: Self-pay | Admitting: Adult Health

## 2023-07-27 DIAGNOSIS — F314 Bipolar disorder, current episode depressed, severe, without psychotic features: Secondary | ICD-10-CM | POA: Diagnosis not present

## 2023-07-27 DIAGNOSIS — K5900 Constipation, unspecified: Secondary | ICD-10-CM | POA: Diagnosis not present

## 2023-07-27 DIAGNOSIS — R41841 Cognitive communication deficit: Secondary | ICD-10-CM | POA: Diagnosis not present

## 2023-07-27 DIAGNOSIS — M47816 Spondylosis without myelopathy or radiculopathy, lumbar region: Secondary | ICD-10-CM | POA: Diagnosis not present

## 2023-07-27 DIAGNOSIS — R32 Unspecified urinary incontinence: Secondary | ICD-10-CM | POA: Diagnosis not present

## 2023-07-27 DIAGNOSIS — R2681 Unsteadiness on feet: Secondary | ICD-10-CM | POA: Diagnosis not present

## 2023-07-27 NOTE — Progress Notes (Signed)
 Location:  Medical illustrator of Service:  ALF (13) Provider:   Janace Mckusick, ANP [facility] [phone]   Marguerite Shiley, MD  Patient Care Team: Marguerite Shiley, MD as PCP - General (Internal Medicine) Eilleen Grates, MD as PCP - Cardiology (Cardiology) Daphine Eagle, MD as Consulting Physician (Psychiatry) Artemisa Bile, MD as Consulting Physician (Internal Medicine)  Extended Emergency Contact Information Primary Emergency Contact: Kasheena, [place], [place] [address] [place] Home Phone: [phone] Mobile Phone: [phone] Relation: Son Secondary Emergency Contact: Colquhoun,sandy Mobile Phone: [phone] Relation: Son  Code Status:  DNR Goals of care: Advanced Directive information    05/17/2023   10:00 AM  Advanced Directives  Does Patient Have a Medical Advance Directive? Yes  Type of Advance Directive Out of facility DNR (pink MOST or yellow form)  Does patient want to make changes to medical advance directive? No - Patient declined     Chief Complaint  Patient presents with   Acute Visit    Concerns for depression     HPI:   89 y.o, female residing in assisted living with 24 hr care with concerns for depression and lack of self care.   She resides in an assisted living facility and is doing well overall, though she does not engage in as much exercise as she should. She has home care assistance primarily for dressing, arranged by her daughter and sons. She is able to walk independently with a walker but requires assistance with bathing. Her shower schedule has become more sporadic compared to the previous routine of twice a week.  Staff report she avoids brushing her teeth at times and avoids changing her clothes or showering.   She was hospitalized for a small bowel obstruction, from which she has recovered. She has a history of a previous large bowel obstruction that required surgical  intervention. She does not recall the recent hospitalization details and notes that her bowel movements are now regular, occurring every day or every other day, though the stool is soft and small in size. She is currently taking Miralax  for constipation, which has regulated her bowel movements. No nausea, vomiting, or urinary issues. She takes Macrodantin  at night for urinary health and has not had a UTI in over a year.  She reports a lack of appetite, which has persisted for about two years, partly due to the type of food available at the facility, which she describes as not to her taste. Weight is stable.   She has a history of bipolar depression (has tried ECT), which began after the birth of her third child. She is currently on Rexulti , Effexor , and Cogentin  for mood management. She does not feel depressed now, although she occasionally reflects on her life. No current anxiety or panic symptoms.  She reports sleeping well at night with the help of Ativan , which aids in maintaining sleep. No feelings of hopelessness or current depressive episodes.  MMSE 26/30 05/27/23  Past Medical History:  Diagnosis Date   Anxiety    Arthritis    Bipolar disorder (HCC)    Depression    Hyperlipidemia    Hypothyroidism    Irritable bowel syndrome    RA (rheumatoid arthritis) (HCC)    Past Surgical History:  Procedure Laterality Date   ABDOMINAL HYSTERECTOMY     Bone Spurs  08/07/13   Right Shoulder   COLONOSCOPY     COLONOSCOPY N/A 05/08/2012  Procedure: COLONOSCOPY;  Surgeon: Ruby Corporal, MD;  Location: [facility];  Service: Endoscopy;  Laterality: N/A;  730   COLOSTOMY     ESOPHAGOGASTRODUODENOSCOPY N/A 11/04/2013   Procedure: ESOPHAGOGASTRODUODENOSCOPY (EGD);  Surgeon: Ruby Corporal, MD;  Location: [facility];  Service: Endoscopy;  Laterality: N/A;  730   EYE SURGERY  cataract x 2   MALONEY DILATION N/A 11/04/2013   Procedure: MALONEY DILATION;  Surgeon: Ruby Corporal, MD;   Location: [facility];  Service: Endoscopy;  Laterality: N/A;   UPPER GASTROINTESTINAL ENDOSCOPY      Allergies  Allergen Reactions   Penicillins Itching and Rash   Hydrocodone Other (See Comments)    MAO-I ; cannot take medications.    Morphine  And Codeine     Unable to take due to MAO-I   Morphine  Sulfate Other (See Comments)   Sulfamethoxazole-Trimethoprim Nausea Only, Other (See Comments) and Nausea And Vomiting    And diarrhea.  Other Reaction(s): GI Intolerance    Outpatient Encounter Medications as of 07/27/2023  Medication Sig   acetaminophen  (TYLENOL ) 500 MG tablet Take 1,000 mg by mouth 3 (three) times daily as needed (joint pain).   amiodarone  (PACERONE ) 200 MG tablet 200mg  bid till 05/20/23 then 200mg  daily   apixaban  (ELIQUIS ) 2.5 MG TABS tablet Take 1 tablet (2.5 mg total) by mouth 2 (two) times daily.   B Complex-C (B-COMPLEX WITH VITAMIN C) tablet Take 1 tablet by mouth in the morning.   benztropine  (COGENTIN ) 0.5 MG tablet Take 0.5 mg by mouth at bedtime.   Cholecalciferol (VITAMIN D ) 50 MCG (2000 UT) tablet Take 2,000 Units by mouth in the morning.   cyanocobalamin  (VITAMIN B12) 1000 MCG tablet Take 1,000 mcg by mouth in the morning.   docusate sodium  (COLACE) 100 MG capsule Take 1 capsule (100 mg total) by mouth 2 (two) times daily.   L-Methylfolate-Algae (L-METHYLFOLATE FORTE) 15-90.314 MG CAPS Take 1 capsule by mouth in the morning.   levothyroxine  (SYNTHROID ) 88 MCG tablet Take 88 mcg by mouth daily before breakfast.   LORazepam  (ATIVAN ) 0.5 MG tablet Take 0.5 tablets (0.25 mg total) by mouth 2 (two) times daily as needed for anxiety.   melatonin 5 MG TABS Take 5 mg by mouth at bedtime.   metoprolol  succinate (TOPROL -XL) 25 MG 24 hr tablet Take 2 tablets (50 mg total) by mouth in the morning.   Multiple Vitamins-Minerals (MULTIVITAMIN WITH MINERALS) tablet Take 1 tablet by mouth in the morning.   nitrofurantoin  (MACRODANTIN ) 50 MG capsule Take 1 capsule (50 mg  total) by mouth at bedtime.   pantoprazole  (PROTONIX ) 40 MG tablet Take 1 tablet (40 mg total) by mouth daily. Before dinner.   polyethylene glycol (MIRALAX  / GLYCOLAX ) 17 g packet Take 17 g by mouth daily.   REXULTI  1 MG TABS tablet Take 1 mg by mouth at bedtime.   venlafaxine  XR (EFFEXOR -XR) 150 MG 24 hr capsule Take 150 mg by mouth in the morning.   No facility-administered encounter medications on file as of 07/27/2023.    Review of Systems  Constitutional:  Negative for activity change, appetite change, chills, diaphoresis, fatigue, fever and unexpected weight change.  HENT:  Negative for congestion.   Respiratory:  Negative for cough, shortness of breath and wheezing.   Cardiovascular:  Negative for chest pain, palpitations and leg swelling.  Gastrointestinal:  Negative for abdominal distention, abdominal pain, constipation and diarrhea.  Genitourinary:  Negative for difficulty urinating and dysuria.  Musculoskeletal:  Positive for  gait problem. Negative for arthralgias, back pain, joint swelling and myalgias.  Neurological:  Negative for dizziness, tremors, seizures, syncope, facial asymmetry, speech difficulty, weakness, light-headedness, numbness and headaches.  Psychiatric/Behavioral:  Positive for behavioral problems. Negative for agitation and confusion.     Immunization History  Administered Date(s) Administered   Influenza-Unspecified 01/03/2023   Moderna Covid-19 Vaccine Bivalent Booster 25yrs & up 01/03/2023   Moderna Sars-Covid-2 Vaccination 04/05/2019, 05/01/2019   Pneumococcal Conjugate-13 12/21/2016   Pneumococcal Polysaccharide-23 03/11/2020   Tdap 08/23/2012, 07/13/2022   Pertinent  Health Maintenance Due  Topic Date Due   INFLUENZA VACCINE  10/13/2023   DEXA SCAN  Completed      05/04/2020   10:49 PM 05/05/2020    8:00 AM 05/05/2020    8:54 PM 05/06/2020   12:00 PM 07/11/2023    2:52 PM  Fall Risk  Falls in the past year?     1  Was there an injury with  Fall?     0  Fall Risk Category Calculator     1  (RETIRED) Patient Fall Risk Level High fall risk High fall risk High fall risk High fall risk   Patient at Risk for Falls Due to     Impaired balance/gait;Impaired mobility  Fall risk Follow up     Falls evaluation completed   Functional Status Survey:    There were no vitals filed for this visit. There is no height or weight on file to calculate BMI. Physical Exam Vitals and nursing note reviewed.  Constitutional:      General: She is not in acute distress.    Appearance: She is not diaphoretic.  HENT:     Head: Normocephalic and atraumatic.     Mouth/Throat:     Mouth: Mucous membranes are moist.     Pharynx: Oropharynx is clear.  Eyes:     Conjunctiva/sclera: Conjunctivae normal.     Pupils: Pupils are equal, round, and reactive to light.  Neck:     Vascular: No JVD.  Cardiovascular:     Rate and Rhythm: Normal rate and regular rhythm.     Heart sounds: No murmur heard. Pulmonary:     Effort: Pulmonary effort is normal. No respiratory distress.     Breath sounds: Normal breath sounds. No wheezing.  Abdominal:     General: Bowel sounds are normal. There is no distension.     Palpations: Abdomen is soft.     Tenderness: There is no abdominal tenderness. There is no guarding.     Comments: Rotund abd  Musculoskeletal:     Comments: Contracture deformity to both hands.   Skin:    General: Skin is warm and dry.  Neurological:     Mental Status: She is alert and oriented to person, place, and time.  Psychiatric:        Mood and Affect: Mood normal.     Labs reviewed: Recent Labs    05/15/23 0335 05/16/23 0520 05/17/23 0525 05/18/23 0345  NA 139 137 138 135  K 3.6 4.4 4.2 4.1  CL 104 105 101 105  CO2 25 24 27 26   GLUCOSE 93 95 119* 110*  BUN 7* 5* 10 9  CREATININE 0.66 0.69 0.79 0.71  CALCIUM 7.8* 8.3* 8.7* 8.4*  MG 1.7 2.0 1.9 1.9  PHOS 2.0* 3.0 3.6  --    Recent Labs    05/08/23 2120 05/09/23 0747  05/13/23 0250 05/15/23 0335 05/16/23 0520 05/17/23 0525  AST 17 26  --   --   --   --  ALT 13 14  --   --   --   --   ALKPHOS 67 50  --   --   --   --   BILITOT 0.4 0.7  --   --   --   --   PROT 8.1 7.0  --   --   --   --   ALBUMIN  4.6 3.3*   < > 3.2* 3.0* 3.0*   < > = values in this interval not displayed.   Recent Labs    05/13/23 0250 05/14/23 0320 05/15/23 0335 05/16/23 0520 05/17/23 0525  WBC 11.6* 12.9* 9.3 10.7* 11.8*  NEUTROABS 8.5* 8.1* 7.0  --   --   HGB 11.5* 10.3* 8.8* 9.0* 9.6*  HCT 33.4* 30.1* 26.3* 27.6* 28.8*  MCV 94.4 95.3 96.3 97.9 97.3  PLT 120* 127* 141* 171 190   Lab Results  Component Value Date   TSH 4.600 (H) 05/13/2023   No results found for: "HGBA1C" No results found for: "CHOL", "HDL", "LDLCALC", "LDLDIRECT", "TRIG", "CHOLHDL"  Significant Diagnostic Results in last 30 days:  No results found.  Assessment/Plan  Depression No signs of depression as this time. She does have some lack of self care and avoidance of personal care which may be due to some cognitive impairment.  Managed with Rexulti , Effexor , and Cogentin . No current episodes. Psychiatric follow-up needed. - Ensure follow-up with psychiatrist for depression management.  Constipation Managed with Miralax , resulting in regular, soft stools. Satisfied with management. - Continue Miralax  for constipation management.  Urinary incontinence Managed with briefs. No dysuria or urination difficulty. Advised on hygiene to prevent UTIs. - Recommend showering at least twice a week to prevent urinary tract infections.  Prophylactic use of Macrodantin  Effective prophylaxis with no UTIs reported. Satisfied with treatment. - Continue Macrodantin  as prescribed by urologist.   Total time :  time greater than 50% of total time spent doing pt counseling and coordination of care    Labs ordered prior to next apt in August.

## 2023-07-31 DIAGNOSIS — M47816 Spondylosis without myelopathy or radiculopathy, lumbar region: Secondary | ICD-10-CM | POA: Diagnosis not present

## 2023-07-31 DIAGNOSIS — R2681 Unsteadiness on feet: Secondary | ICD-10-CM | POA: Diagnosis not present

## 2023-07-31 DIAGNOSIS — R41841 Cognitive communication deficit: Secondary | ICD-10-CM | POA: Diagnosis not present

## 2023-08-01 DIAGNOSIS — R41841 Cognitive communication deficit: Secondary | ICD-10-CM | POA: Diagnosis not present

## 2023-08-01 DIAGNOSIS — M47816 Spondylosis without myelopathy or radiculopathy, lumbar region: Secondary | ICD-10-CM | POA: Diagnosis not present

## 2023-08-01 DIAGNOSIS — R2681 Unsteadiness on feet: Secondary | ICD-10-CM | POA: Diagnosis not present

## 2023-08-03 DIAGNOSIS — F333 Major depressive disorder, recurrent, severe with psychotic symptoms: Secondary | ICD-10-CM | POA: Diagnosis not present

## 2023-08-03 DIAGNOSIS — R41841 Cognitive communication deficit: Secondary | ICD-10-CM | POA: Diagnosis not present

## 2023-08-03 DIAGNOSIS — R2681 Unsteadiness on feet: Secondary | ICD-10-CM | POA: Diagnosis not present

## 2023-08-03 DIAGNOSIS — M47816 Spondylosis without myelopathy or radiculopathy, lumbar region: Secondary | ICD-10-CM | POA: Diagnosis not present

## 2023-08-10 DIAGNOSIS — R2681 Unsteadiness on feet: Secondary | ICD-10-CM | POA: Diagnosis not present

## 2023-08-10 DIAGNOSIS — R41841 Cognitive communication deficit: Secondary | ICD-10-CM | POA: Diagnosis not present

## 2023-08-10 DIAGNOSIS — M47816 Spondylosis without myelopathy or radiculopathy, lumbar region: Secondary | ICD-10-CM | POA: Diagnosis not present

## 2023-08-15 DIAGNOSIS — R41841 Cognitive communication deficit: Secondary | ICD-10-CM | POA: Diagnosis not present

## 2023-08-15 DIAGNOSIS — R2681 Unsteadiness on feet: Secondary | ICD-10-CM | POA: Diagnosis not present

## 2023-08-15 DIAGNOSIS — M47816 Spondylosis without myelopathy or radiculopathy, lumbar region: Secondary | ICD-10-CM | POA: Diagnosis not present

## 2023-08-16 DIAGNOSIS — M47816 Spondylosis without myelopathy or radiculopathy, lumbar region: Secondary | ICD-10-CM | POA: Diagnosis not present

## 2023-08-16 DIAGNOSIS — R41841 Cognitive communication deficit: Secondary | ICD-10-CM | POA: Diagnosis not present

## 2023-08-16 DIAGNOSIS — R2681 Unsteadiness on feet: Secondary | ICD-10-CM | POA: Diagnosis not present

## 2023-08-18 DIAGNOSIS — M47816 Spondylosis without myelopathy or radiculopathy, lumbar region: Secondary | ICD-10-CM | POA: Diagnosis not present

## 2023-08-18 DIAGNOSIS — R41841 Cognitive communication deficit: Secondary | ICD-10-CM | POA: Diagnosis not present

## 2023-08-18 DIAGNOSIS — R2681 Unsteadiness on feet: Secondary | ICD-10-CM | POA: Diagnosis not present

## 2023-08-24 DIAGNOSIS — N390 Urinary tract infection, site not specified: Secondary | ICD-10-CM | POA: Diagnosis not present

## 2023-08-25 ENCOUNTER — Non-Acute Institutional Stay: Payer: Self-pay | Admitting: Adult Health

## 2023-08-25 DIAGNOSIS — R350 Frequency of micturition: Secondary | ICD-10-CM

## 2023-08-25 DIAGNOSIS — F419 Anxiety disorder, unspecified: Secondary | ICD-10-CM | POA: Diagnosis not present

## 2023-08-25 MED ORDER — LORAZEPAM 0.5 MG PO TABS: 0.2500 mg | ORAL_TABLET | Freq: Every day | ORAL | Status: DC

## 2023-08-25 MED ORDER — FLUCONAZOLE 150 MG PO TABS: 150.0000 mg | ORAL_TABLET | Freq: Once | ORAL | Status: AC

## 2023-08-25 NOTE — Progress Notes (Unsigned)
 Location:  Medical illustrator of Service:  ALF (13) Provider:   Janace Mckusick, ANP [facility] [phone]   Marguerite Shiley, MD  Patient Care Team: Marguerite Shiley, MD as PCP - General (Internal Medicine) Eilleen Grates, MD as PCP - Cardiology (Cardiology) Daphine Eagle, MD as Consulting Physician (Psychiatry) Artemisa Bile, MD as Consulting Physician (Internal Medicine)  Extended Emergency Contact Information Primary Emergency Contact: Elena, [place], [place] [address] [place] Home Phone: [phone] Mobile Phone: [phone] Relation: Son Secondary Emergency Contact: Cayson,sandy Mobile Phone: [phone] Relation: Son  Code Status:  DNR Goals of care: Advanced Directive information    05/17/2023   10:00 AM  Advanced Directives  Does Patient Have a Medical Advance Directive? Yes  Type of Advance Directive Out of facility DNR (pink MOST or yellow form)  Does patient want to make changes to medical advance directive? No - Patient declined     Chief Complaint  Patient presents with   Acute Visit    Urinary frequency    HPI:  Pt is a 89 y.o. female seen today for an acute visit for urinary frequency.   For the past few days she has noticed increased urinary frequency. Has a hx of recurrent UTI and is on macrodantin . Due to the above symptoms a UA C and S was obtained which was negative. She is certain she has a UTI or other urinary problem. Bladder scan post void have been obtained with 5 cc.   No fever, burning or back pain. No vaginal drainage.   She reports anxiety and is on rexulti  now and using ativan  as well. Long hx of bipolar depression followed by psych.   Past Medical History:  Diagnosis Date   Anxiety    Arthritis    Bipolar disorder (HCC)    Depression    Hyperlipidemia    Hypothyroidism    Irritable bowel syndrome    RA (rheumatoid arthritis) (HCC)    Past Surgical History:   Procedure Laterality Date   ABDOMINAL HYSTERECTOMY     Bone Spurs  08/07/13   Right Shoulder   COLONOSCOPY     COLONOSCOPY N/A 05/08/2012   Procedure: COLONOSCOPY;  Surgeon: Ruby Corporal, MD;  Location: [facility];  Service: Endoscopy;  Laterality: N/A;  730   COLOSTOMY     ESOPHAGOGASTRODUODENOSCOPY N/A 11/04/2013   Procedure: ESOPHAGOGASTRODUODENOSCOPY (EGD);  Surgeon: Ruby Corporal, MD;  Location: [facility];  Service: Endoscopy;  Laterality: N/A;  730   EYE SURGERY  cataract x 2   MALONEY DILATION N/A 11/04/2013   Procedure: MALONEY DILATION;  Surgeon: Ruby Corporal, MD;  [facility]: [facility];  Service: Endoscopy;  Laterality: N/A;   UPPER GASTROINTESTINAL ENDOSCOPY      Allergies  Allergen Reactions   Penicillins Itching and Rash   Hydrocodone Other (See Comments)    MAO-I ; cannot take medications.    Morphine  And Codeine     Unable to take due to MAO-I   Morphine  Sulfate Other (See Comments)   Sulfamethoxazole-Trimethoprim Nausea Only, Other (See Comments) and Nausea And Vomiting    And diarrhea.  Other Reaction(s): GI Intolerance    Outpatient Encounter Medications as of 08/25/2023  Medication Sig   fluconazole  (DIFLUCAN ) 150 MG tablet Take 1 tablet (150 mg total) by mouth once for 1 dose.   acetaminophen  (TYLENOL ) 500 MG tablet Take 1,000 mg by mouth 3 (  three) times daily as needed (joint pain).   amiodarone  (PACERONE ) 200 MG tablet 200mg  bid till 05/20/23 then 200mg  daily   apixaban  (ELIQUIS ) 2.5 MG TABS tablet Take 1 tablet (2.5 mg total) by mouth 2 (two) times daily.   B Complex-C (B-COMPLEX WITH VITAMIN C) tablet Take 1 tablet by mouth in the morning.   benztropine  (COGENTIN ) 0.5 MG tablet Take 0.5 mg by mouth at bedtime.   Cholecalciferol (VITAMIN D ) 50 MCG (2000 UT) tablet Take 2,000 Units by mouth in the morning.   cyanocobalamin  (VITAMIN B12) 1000 MCG tablet Take 1,000 mcg by mouth in the morning.   docusate sodium  (COLACE) 100 MG capsule Take 1  capsule (100 mg total) by mouth 2 (two) times daily.   L-Methylfolate-Algae (L-METHYLFOLATE FORTE) 15-90.314 MG CAPS Take 1 capsule by mouth in the morning.   levothyroxine  (SYNTHROID ) 88 MCG tablet Take 88 mcg by mouth daily before breakfast.   LORazepam  (ATIVAN ) 0.5 MG tablet Take 0.5 tablets (0.25 mg total) by mouth at bedtime. And every day prn   melatonin 5 MG TABS Take 5 mg by mouth at bedtime.   metoprolol  succinate (TOPROL -XL) 25 MG 24 hr tablet Take 2 tablets (50 mg total) by mouth in the morning.   Multiple Vitamins-Minerals (MULTIVITAMIN WITH MINERALS) tablet Take 1 tablet by mouth in the morning.   nitrofurantoin  (MACRODANTIN ) 50 MG capsule Take 1 capsule (50 mg total) by mouth at bedtime.   pantoprazole  (PROTONIX ) 40 MG tablet Take 1 tablet (40 mg total) by mouth daily. Before dinner.   polyethylene glycol (MIRALAX  / GLYCOLAX ) 17 g packet Take 17 g by mouth daily.   REXULTI  1 MG TABS tablet Take 1 mg by mouth at bedtime.   venlafaxine  XR (EFFEXOR -XR) 150 MG 24 hr capsule Take 150 mg by mouth in the morning.   [DISCONTINUED] LORazepam  (ATIVAN ) 0.5 MG tablet Take 0.5 tablets (0.25 mg total) by mouth 2 (two) times daily as needed for anxiety.   No facility-administered encounter medications on file as of 08/25/2023.    Review of Systems  Constitutional:  Negative for activity change, appetite change, chills, diaphoresis, fatigue, fever and unexpected weight change.  HENT:  Negative for congestion.   Respiratory:  Negative for cough, shortness of breath and wheezing.   Cardiovascular:  Negative for chest pain, palpitations and leg swelling.  Gastrointestinal:  Negative for abdominal distention, abdominal pain, constipation and diarrhea.  Genitourinary:  Positive for frequency and urgency. Negative for decreased urine volume, difficulty urinating, dysuria, flank pain, hematuria, pelvic pain, vaginal bleeding, vaginal discharge and vaginal pain.  Musculoskeletal:  Positive for  arthralgias and gait problem. Negative for back pain, joint swelling and myalgias.  Neurological:  Negative for dizziness, tremors, seizures, syncope, facial asymmetry, speech difficulty, weakness, light-headedness, numbness and headaches.  Psychiatric/Behavioral:  Negative for agitation, behavioral problems and confusion. The patient is nervous/anxious.     Immunization History  Administered Date(s) Administered   Influenza-Unspecified 01/03/2023   Moderna Covid-19 Vaccine Bivalent Booster 79yrs & up 01/03/2023   Moderna Sars-Covid-2 Vaccination 04/05/2019, 05/01/2019   Pneumococcal Conjugate-13 12/21/2016   Pneumococcal Polysaccharide-23 03/11/2020   Tdap 08/23/2012, 07/13/2022   Pertinent  Health Maintenance Due  Topic Date Due   INFLUENZA VACCINE  10/13/2023   DEXA SCAN  Completed      05/04/2020   10:49 PM 05/05/2020    8:00 AM 05/05/2020    8:54 PM 05/06/2020   12:00 PM 07/11/2023    2:52 PM  Fall Risk  Falls in the  past year?     1  Was there an injury with Fall?     0  Fall Risk Category Calculator     1  (RETIRED) Patient Fall Risk Level High fall risk  High fall risk  High fall risk  High fall risk    Patient at Risk for Falls Due to     Impaired balance/gait;Impaired mobility  Fall risk Follow up     Falls evaluation completed     Data saved with a previous flowsheet row definition   Functional Status Survey:    Vitals:   08/25/23 1159  BP: (!) 153/79  Pulse: 91  Resp: 20  Temp: (!) 97.2 F (36.2 C)   There is no height or weight on file to calculate BMI. Physical Exam Vitals and nursing note reviewed.  Constitutional:      General: She is not in acute distress.    Appearance: She is not diaphoretic.  HENT:     Head: Normocephalic and atraumatic.  Neck:     Vascular: No JVD.   Cardiovascular:     Rate and Rhythm: Normal rate and regular rhythm.     Heart sounds: No murmur heard. Pulmonary:     Effort: Pulmonary effort is normal. No respiratory  distress.     Breath sounds: Normal breath sounds. No wheezing.  Abdominal:     General: There is no distension.     Palpations: Abdomen is soft.     Tenderness: There is no abdominal tenderness. There is no right CVA tenderness or left CVA tenderness.   Skin:    General: Skin is warm and dry.   Neurological:     Mental Status: She is alert and oriented to person, place, and time.   Psychiatric:        Mood and Affect: Mood normal.     Labs reviewed: Recent Labs    05/15/23 0335 05/16/23 0520 05/17/23 0525 05/18/23 0345  NA 139 137 138 135  K 3.6 4.4 4.2 4.1  CL 104 105 101 105  CO2 25 24 27 26   GLUCOSE 93 95 119* 110*  BUN 7* 5* 10 9  CREATININE 0.66 0.69 0.79 0.71  CALCIUM 7.8* 8.3* 8.7* 8.4*  MG 1.7 2.0 1.9 1.9  PHOS 2.0* 3.0 3.6  --    Recent Labs    05/08/23 2120 05/09/23 0747 05/13/23 0250 05/15/23 0335 05/16/23 0520 05/17/23 0525  AST 17 26  --   --   --   --   ALT 13 14  --   --   --   --   ALKPHOS 67 50  --   --   --   --   BILITOT 0.4 0.7  --   --   --   --   PROT 8.1 7.0  --   --   --   --   ALBUMIN  4.6 3.3*   < > 3.2* 3.0* 3.0*   < > = values in this interval not displayed.   Recent Labs    05/13/23 0250 05/14/23 0320 05/15/23 0335 05/16/23 0520 05/17/23 0525  WBC 11.6* 12.9* 9.3 10.7* 11.8*  NEUTROABS 8.5* 8.1* 7.0  --   --   HGB 11.5* 10.3* 8.8* 9.0* 9.6*  HCT 33.4* 30.1* 26.3* 27.6* 28.8*  MCV 94.4 95.3 96.3 97.9 97.3  PLT 120* 127* 141* 171 190   Lab Results  Component Value Date   TSH 4.600 (H) 05/13/2023   No results found  for: HGBA1C No results found for: CHOL, HDL, LDLCALC, LDLDIRECT, TRIG, CHOLHDL  Significant Diagnostic Results in last 30 days:  No results found.  Assessment/Plan 1. Urinary frequency (Primary) Negative UA Treat for vaginal irritation, if not improving consider further exam and med for OAB - fluconazole  (DIFLUCAN ) 150 MG tablet; Take 1 tablet (150 mg total) by mouth once for 1  dose. Remains on macrodantin  for UTI prophylaxis.   2. Anxiety Also she appears anxious about some personal issues and she is going to try the prn ativan  that Dr Levie Ream ordered Remains on Rexulti  and Effexor .     Family/ staff Communication: resident and nurse  Labs/tests ordered:  NA   Total time :  time greater than 50% of total time spent doing pt counseling and coordination of care

## 2023-08-26 ENCOUNTER — Encounter: Payer: Self-pay | Admitting: Adult Health

## 2023-09-07 ENCOUNTER — Ambulatory Visit (HOSPITAL_COMMUNITY)
Admission: RE | Admit: 2023-09-07 | Discharge: 2023-09-07 | Disposition: A | Discharge status: Home / Self Care | Source: Ambulatory Visit | Attending: Internal Medicine | Admitting: Internal Medicine

## 2023-09-07 VITALS — BP 122/96 | HR 84 | Ht 60.0 in | Wt 121.2 lb

## 2023-09-07 DIAGNOSIS — Z5181 Encounter for therapeutic drug level monitoring: Secondary | ICD-10-CM | POA: Diagnosis not present

## 2023-09-07 DIAGNOSIS — I4891 Unspecified atrial fibrillation: Secondary | ICD-10-CM | POA: Diagnosis not present

## 2023-09-07 DIAGNOSIS — D6869 Other thrombophilia: Secondary | ICD-10-CM

## 2023-09-07 DIAGNOSIS — Z79899 Other long term (current) drug therapy: Secondary | ICD-10-CM

## 2023-09-07 DIAGNOSIS — I480 Paroxysmal atrial fibrillation: Secondary | ICD-10-CM | POA: Diagnosis not present

## 2023-09-07 MED ORDER — AMIODARONE HCL 200 MG PO TABS
200.0000 mg | ORAL_TABLET | Freq: Every day | ORAL | Status: AC
Start: 2023-09-07 — End: ?

## 2023-09-07 NOTE — Progress Notes (Signed)
 Primary Care Physician: Charlanne Fredia CROME, MD Primary Cardiologist: Lynwood Schilling, MD Electrophysiologist: None     Referring Physician: Dr. Cheryle Maryelizabeth VEAR Erica is a 89 y.o. female with a history of bowel obstruction, anemia, HTN, RA, bipolar disorder, hypothyroidism, HLD, and paroxysmal atrial fibrillation who presents for consultation in the [facility]. Hospital admission 2/24-05/18/23 for SBO and aspiration pneumonia with course complicated by Afib with RVR. Discharged on amiodarone  and OAC and Toprol  50 mg daily. Patient is on Eliquis  2.5 mg BID for a CHADS2VASC score of 4.  On evaluation today, she is currently in NSR. Patient notes to be nervous today. She loaded on amiodarone  200 mg BID until 3/8 and transitioned to 200 mg once daily. No bleeding issues on Eliquis  2.5 mg BID. She has felt okay since hospital discharge, currently undergoing rehab at [facility] getting strength back slowly.   On follow up 09/07/23, patient is here for amiodarone  surveillance. She is currently in NSR. No missed doses of amiodarone  200 mg daily or Eliquis  2.5 mg BID.   Today, she denies symptoms of palpitations, chest pain, shortness of breath, orthopnea, PND, lower extremity edema, dizziness, presyncope, syncope, snoring, daytime somnolence, bleeding, or neurologic sequela. The patient is tolerating medications without difficulties and is otherwise without complaint today.   she has a BMI of Body mass index is 23.67 kg/m.SABRA Filed Weights   09/07/23 1107  Weight: 55 kg     Current Outpatient Medications  Medication Sig Dispense Refill   acetaminophen  (TYLENOL ) 500 MG tablet Take 1,000 mg by mouth 3 (three) times daily as needed (joint pain).     amiodarone  (PACERONE ) 200 MG tablet 200mg  bid till 05/20/23 then 200mg  daily 30 tablet 0   apixaban  (ELIQUIS ) 2.5 MG TABS tablet Take 1 tablet (2.5 mg total) by mouth 2 (two) times daily. 60 tablet 0   B Complex-C (B-COMPLEX  WITH VITAMIN C) tablet Take 1 tablet by mouth in the morning.     benztropine  (COGENTIN ) 0.5 MG tablet Take 0.5 mg by mouth at bedtime.     Cholecalciferol (VITAMIN D ) 50 MCG (2000 UT) tablet Take 2,000 Units by mouth in the morning.     cyanocobalamin  (VITAMIN B12) 1000 MCG tablet Take 1,000 mcg by mouth in the morning.     docusate sodium  (COLACE) 100 MG capsule Take 1 capsule (100 mg total) by mouth 2 (two) times daily. 10 capsule 0   L-Methylfolate-Algae (L-METHYLFOLATE FORTE) 15-90.314 MG CAPS Take 1 capsule by mouth in the morning.     levothyroxine  (SYNTHROID ) 88 MCG tablet Take 88 mcg by mouth daily before breakfast.     LORazepam  (ATIVAN ) 0.5 MG tablet Take 0.5 tablets (0.25 mg total) by mouth at bedtime. And every day prn     melatonin 5 MG TABS Take 5 mg by mouth at bedtime.     metoprolol  succinate (TOPROL -XL) 25 MG 24 hr tablet Take 2 tablets (50 mg total) by mouth in the morning.     Multiple Vitamins-Minerals (MULTIVITAMIN WITH MINERALS) tablet Take 1 tablet by mouth in the morning.     nitrofurantoin  (MACRODANTIN ) 50 MG capsule Take 1 capsule (50 mg total) by mouth at bedtime. 90 capsule 3   pantoprazole  (PROTONIX ) 40 MG tablet Take 1 tablet (40 mg total) by mouth daily. Before dinner.     polyethylene glycol (MIRALAX  / GLYCOLAX ) 17 g packet Take 17 g by mouth daily. 14 each 0   REXULTI  1 MG  TABS tablet Take 1 mg by mouth at bedtime.     venlafaxine  XR (EFFEXOR -XR) 150 MG 24 hr capsule Take 150 mg by mouth in the morning.     No current facility-administered medications for this encounter.    Atrial Fibrillation Management history:  Previous antiarrhythmic drugs: amiodarone  Previous cardioversions: none Previous ablations: none Anticoagulation history: Eliquis  2.5 mg BID   ROS- All systems are reviewed and negative except as per the HPI above.  Physical Exam: BP (!) 122/96   Pulse 84   Ht 5' (1.524 m)   Wt 55 kg   BMI 23.67 kg/m   GEN- The patient is well  appearing, alert and oriented x 3 today.   Neck - no JVD or carotid bruit noted Lungs- Clear to ausculation bilaterally, normal work of breathing Heart- Regular rate and rhythm, no murmurs, rubs or gallops, PMI not laterally displaced Extremities- no clubbing, cyanosis, or edema Skin - no rash or ecchymosis noted   EKG today demonstrates  Vent. rate 84 BPM PR interval 164 ms QRS duration 76 ms QT/QTcB 380/449 ms P-R-T axes 51 66 19 Normal sinus rhythm Normal ECG When compared with ECG of 06-Jun-2023 10:56, Previous ECG is present  Echo 05/12/23 demonstrated  1. Left ventricular ejection fraction, by estimation, is 70 to 75%. Left  ventricular ejection fraction by PLAX is 75 %. The left ventricle has  hyperdynamic function. The left ventricle has no regional wall motion  abnormalities. Left ventricular  diastolic parameters are consistent with Grade I diastolic dysfunction  (impaired relaxation).   2. Right ventricular systolic function is normal. The right ventricular  size is normal. There is normal pulmonary artery systolic pressure. The  estimated right ventricular systolic pressure is 29.8 mmHg.   3. The mitral valve is normal in structure. Mild mitral valve  regurgitation. Moderate mitral annular calcification.   4. The aortic valve is tricuspid. Aortic valve regurgitation is not  visualized. Aortic valve sclerosis is present, with no evidence of aortic  valve stenosis.   5. The inferior vena cava is normal in size with greater than 50%  respiratory variability, suggesting right atrial pressure of 3 mmHg.   ASSESSMENT & PLAN CHA2DS2-VASc Score = 4  The patient's score is based upon: CHF History: 0 HTN History: 1 Diabetes History: 0 Stroke History: 0 Vascular Disease History: 0 Age Score: 2 Gender Score: 1       ASSESSMENT AND PLAN: Paroxysmal Atrial Fibrillation (ICD10:  I48.0) The patient's CHA2DS2-VASc score is 4, indicating a 4.8% annual risk of stroke.     She is currently in NSR. Will continue current management without change.  Secondary Hypercoagulable State (ICD10:  D68.69) The patient is at significant risk for stroke/thromboembolism based upon her CHA2DS2-VASc Score of 4.  Continue Apixaban  (Eliquis ).  Continue Eliquis  2.5 mg BID.  Correct dosage based on age and weight < 60 kg.   High risk medication monitoring (ICD10: U5195107) Patient requires ongoing monitoring for anti-arrhythmic medication which has the potential to cause life threatening arrhythmias or AV block. Qtc stable. Continue amiodarone  200 mg daily. Cmet and TSH drawn today.   Follow up 6 months for amiodarone  surveillance.   Terra Pac, PA-C  [facility] [address] [place], [place] [address] [phone]

## 2023-09-07 NOTE — Addendum Note (Signed)
 Encounter addended by: Franchot Glade RAMAN, RN on: 09/07/2023 11:41 AM  Actions taken: Medication List reviewed, Order list changed

## 2023-09-08 ENCOUNTER — Ambulatory Visit (HOSPITAL_COMMUNITY): Payer: Self-pay | Admitting: Internal Medicine

## 2023-09-08 LAB — COMPREHENSIVE METABOLIC PANEL WITH GFR
ALT: 16 IU/L (ref 0–32)
AST: 19 IU/L (ref 0–40)
Albumin: 4.4 g/dL (ref 3.7–4.7)
Alkaline Phosphatase: 88 IU/L (ref 44–121)
BUN/Creatinine Ratio: 18 (ref 12–28)
BUN: 15 mg/dL (ref 8–27)
Bilirubin Total: <0.2 mg/dL (ref 0.0–1.2)
CO2: 20 mmol/L (ref 20–29)
Calcium: 9.7 mg/dL (ref 8.7–10.3)
Chloride: 101 mmol/L (ref 96–106)
Creatinine, Ser: 0.82 mg/dL (ref 0.57–1.00)
Globulin, Total: 3.2 g/dL (ref 1.5–4.5)
Glucose: 84 mg/dL (ref 70–99)
Potassium: 4.4 mmol/L (ref 3.5–5.2)
Sodium: 141 mmol/L (ref 134–144)
Total Protein: 7.6 g/dL (ref 6.0–8.5)
eGFR: 69 mL/min/{1.73_m2} (ref >59)

## 2023-09-08 LAB — TSH: TSH: 10.5 u[IU]/mL — ABNORMAL HIGH (ref 0.450–4.500)

## 2023-09-28 DIAGNOSIS — L720 Epidermal cyst: Secondary | ICD-10-CM | POA: Diagnosis not present

## 2023-10-05 ENCOUNTER — Encounter: Payer: Self-pay | Admitting: Adult Health

## 2023-10-05 ENCOUNTER — Non-Acute Institutional Stay: Payer: Self-pay | Admitting: Adult Health

## 2023-10-05 DIAGNOSIS — L602 Onychogryphosis: Secondary | ICD-10-CM

## 2023-10-05 DIAGNOSIS — N952 Postmenopausal atrophic vaginitis: Secondary | ICD-10-CM | POA: Diagnosis not present

## 2023-10-05 DIAGNOSIS — M2011 Hallux valgus (acquired), right foot: Secondary | ICD-10-CM | POA: Diagnosis not present

## 2023-10-05 MED ORDER — ESTROGENS CONJUGATED 0.625 MG/GM VA CREA: 1.0000 | TOPICAL_CREAM | VAGINAL | Status: DC

## 2023-10-05 MED ORDER — DICLOFENAC SODIUM 1 % EX GEL: 2.0000 g | Freq: Three times a day (TID) | CUTANEOUS | Status: AC

## 2023-10-05 MED ORDER — ACETAMINOPHEN 325 MG PO TABS: 650.0000 mg | ORAL_TABLET | ORAL | Status: AC

## 2023-10-05 NOTE — Progress Notes (Signed)
 Location:  [facility]  POS: Clinic  Provider: Tawni America, ANP  Code Status: DNR Goals of Care:     05/17/2023   10:00 AM  Advanced Directives  Does Patient Have a Medical Advance Directive? Yes  Type of Advance Directive Out of facility DNR (pink MOST or yellow form)  Does patient want to make changes to medical advance directive? No - Patient declined     Chief Complaint  Patient presents with   Acute Visit    Frequency, foot pain    HPI:  Erica [name] is reporting foot pain and issues with arthritis. Erica [name] has two toes that cross over each other that are painful. NO fall, injury or open areas  Also Erica [name] is reported mild vaginal irritation and bladder frequency.  Currently on macrodantin  for UTI Recurring issues, did not resolve with diflucan  with negative UA in June       Past Medical History:  Diagnosis Date   Anxiety    Arthritis    Bipolar disorder (HCC)    Depression    Hyperlipidemia    Hypothyroidism    Irritable bowel syndrome    RA (rheumatoid arthritis) (HCC)     Past Surgical History:  Procedure Laterality Date   ABDOMINAL HYSTERECTOMY     Bone Spurs  08/07/13   Right Shoulder   COLONOSCOPY     COLONOSCOPY N/A 05/08/2012   Procedure: COLONOSCOPY;  Surgeon: Claudis RAYMOND Rivet, MD;  [facility];  Service: Endoscopy;  Laterality: N/A;  730   COLOSTOMY     ESOPHAGOGASTRODUODENOSCOPY N/A 11/04/2013   Procedure: ESOPHAGOGASTRODUODENOSCOPY (EGD);  Surgeon: Claudis RAYMOND Rivet, MD;  [facility];  Service: Endoscopy;  Laterality: N/A;  730   EYE SURGERY  cataract x 2   MALONEY DILATION N/A 11/04/2013   Procedure: MALONEY DILATION;  Surgeon: Claudis RAYMOND Rivet, MD;  [facility]: [facility];  Service: Endoscopy;  Laterality: N/A;   UPPER GASTROINTESTINAL ENDOSCOPY      Allergies  Allergen Reactions   Penicillins Itching and Rash   Hydrocodone Other (See Comments)    MAO-I ; cannot take medications.    Morphine  And Codeine     Unable to  take due to MAO-I   Morphine  Sulfate Other (See Comments)   Sulfamethoxazole-Trimethoprim Nausea Only, Other (See Comments) and Nausea And Vomiting    And diarrhea.  Other Reaction(s): GI Intolerance    Outpatient Encounter Medications as of 10/05/2023  Medication Sig   acetaminophen  (TYLENOL ) 325 MG tablet Take 2 tablets (650 mg total) by mouth every morning.   [START ON 10/06/2023] conjugated estrogens  (PREMARIN ) vaginal cream Place 1 Applicatorful vaginally 3 (three) times a week.   diclofenac  Sodium (VOLTAREN ) 1 % GEL Apply 2 g topically in the morning, at noon, and at bedtime for 14 days.   acetaminophen  (TYLENOL ) 500 MG tablet Take 1,000 mg by mouth 3 (three) times daily as needed (joint pain).   amiodarone  (PACERONE ) 200 MG tablet Take 1 tablet (200 mg total) by mouth daily.   apixaban  (ELIQUIS ) 2.5 MG TABS tablet Take 1 tablet (2.5 mg total) by mouth 2 (two) times daily.   B Complex-C (B-COMPLEX WITH VITAMIN C) tablet Take 1 tablet by mouth in the morning.   benztropine  (COGENTIN ) 0.5 MG tablet Take 0.5 mg by mouth at bedtime.   Cholecalciferol (VITAMIN D ) 50 MCG (2000 UT) tablet Take 2,000 Units by mouth in the morning.   cyanocobalamin  (VITAMIN B12) 1000 MCG tablet Take 1,000 mcg by mouth  in the morning.   docusate sodium  (COLACE) 100 MG capsule Take 1 capsule (100 mg total) by mouth 2 (two) times daily.   L-Methylfolate-Algae (L-METHYLFOLATE FORTE) 15-90.314 MG CAPS Take 1 capsule by mouth in the morning.   levothyroxine  (SYNTHROID ) 88 MCG tablet Take 88 mcg by mouth daily before breakfast.   LORazepam  (ATIVAN ) 0.5 MG tablet Take 0.5 tablets (0.25 mg total) by mouth at bedtime. And every day prn   melatonin 5 MG TABS Take 5 mg by mouth at bedtime.   metoprolol  succinate (TOPROL -XL) 25 MG 24 hr tablet Take 2 tablets (50 mg total) by mouth in the morning.   Multiple Vitamins-Minerals (MULTIVITAMIN WITH MINERALS) tablet Take 1 tablet by mouth in the morning.   nitrofurantoin   (MACRODANTIN ) 50 MG capsule Take 1 capsule (50 mg total) by mouth at bedtime.   pantoprazole  (PROTONIX ) 40 MG tablet Take 1 tablet (40 mg total) by mouth daily. Before dinner.   polyethylene glycol (MIRALAX  / GLYCOLAX ) 17 g packet Take 17 g by mouth daily.   REXULTI  1 MG TABS tablet Take 1 mg by mouth at bedtime.   venlafaxine  XR (EFFEXOR -XR) 150 MG 24 hr capsule Take 150 mg by mouth in the morning.   No facility-administered encounter medications on file as of 10/05/2023.    Review of Systems:  Review of Systems  Health Maintenance  Topic Date Due   Zoster Vaccines- Shingrix (1 of 2) Never done   COVID-19 Vaccine (4 - 2024-25 season) 01/03/2024 (Originally 02/28/2023)   INFLUENZA VACCINE  10/13/2023   Medicare Annual Wellness (AWV)  01/31/2024   DTaP/Tdap/Td (3 - Td or Tdap) 07/12/2032   Pneumococcal Vaccine: 50+ Years  Completed   DEXA SCAN  Completed   Hepatitis B Vaccines  Aged Out   HPV VACCINES  Aged Out   Meningococcal B Vaccine  Aged Out    Physical Exam: Vitals:   10/05/23 1654  BP: 123/77  Pulse: 90  Resp: 18  Temp: 97.9 F (36.6 C)  SpO2: 95%   There is no height or weight on file to calculate BMI. Physical Exam Vitals reviewed.  Constitutional:      Appearance: Normal appearance.  Cardiovascular:     Pulses:          Dorsalis pedis pulses are 2+ on the right side and 2+ on the left side.  Feet:     Right foot:     Skin integrity: Callus present. No ulcer, blister, skin breakdown or erythema.     Toenail Condition: Right toenails are abnormally thick.     Left foot:     Skin integrity: Skin integrity normal. No ulcer, blister, skin breakdown or erythema.     Toenail Condition: Left toenails are abnormally thick.     Comments: Hallux valgus on the right foot Neurological:     Mental Status: Erica [name] is alert.     Labs reviewed: Basic Metabolic Panel: Recent Labs    05/13/23 0250 05/14/23 0320 05/15/23 0335 05/16/23 0520 05/17/23 0525 05/18/23 0345  09/07/23 1203  NA 134*   < > 139 137 138 135 141  K 3.0*   < > 3.6 4.4 4.2 4.1 4.4  CL 96*   < > 104 105 101 105 101  CO2 21*   < > 25 24 27 26 20   GLUCOSE 240*   < > 93 95 119* 110* 84  BUN 10   < > 7* 5* 10 9 15   CREATININE 0.75   < >  0.66 0.69 0.79 0.71 0.82  CALCIUM 7.3*   < > 7.8* 8.3* 8.7* 8.4* 9.7  MG 1.5*   < > 1.7 2.0 1.9 1.9  --   PHOS  --    < > 2.0* 3.0 3.6  --   --   TSH 4.600*  --   --   --   --   --  10.500*   < > = values in this interval not displayed.   Liver Function Tests: Recent Labs    05/08/23 2120 05/09/23 0747 05/13/23 0250 05/16/23 0520 05/17/23 0525 09/07/23 1203  AST 17 26  --   --   --  19  ALT 13 14  --   --   --  16  ALKPHOS 67 50  --   --   --  88  BILITOT 0.4 0.7  --   --   --  <0.2  PROT 8.1 7.0  --   --   --  7.6  ALBUMIN  4.6 3.3*   < > 3.0* 3.0* 4.4   < > = values in this interval not displayed.   Recent Labs    05/08/23 2120  LIPASE <10*   No results for input(s): AMMONIA in the last 8760 hours. CBC: Recent Labs    05/13/23 0250 05/14/23 0320 05/15/23 0335 05/16/23 0520 05/17/23 0525  WBC 11.6* 12.9* 9.3 10.7* 11.8*  NEUTROABS 8.5* 8.1* 7.0  --   --   HGB 11.5* 10.3* 8.8* 9.0* 9.6*  HCT 33.4* 30.1* 26.3* 27.6* 28.8*  MCV 94.4 95.3 96.3 97.9 97.3  PLT 120* 127* 141* 171 190   Lipid Panel: No results for input(s): CHOL, HDL, LDLCALC, TRIG, CHOLHDL, LDLDIRECT in the last 8760 hours. No results found for: HGBA1C  Procedures since last visit: No results found.  Assessment/Plan  1. Hallux valgus of right foot (Primary) -declined toe separator  - acetaminophen  (TYLENOL ) 325 MG tablet; Take 2 tablets (650 mg total) by mouth every morning. - diclofenac  Sodium (VOLTAREN ) 1 % GEL; Apply 2 g topically in the morning, at noon, and at bedtime for 14 days. -podiatry   2. Atrophic vaginitis  - conjugated estrogens  (PREMARIN ) vaginal cream; Place 1 Applicatorful vaginally 3 (three) times a week. Report if not  improving  3. Thickened nails Podiatry consult.     Labs/tests ordered:  * No order type specified * Next appt:  10/16/2023   Total time **:  time greater than 50% of total time spent doing pt counseling and coordination of care

## 2023-10-10 DIAGNOSIS — M79671 Pain in right foot: Secondary | ICD-10-CM | POA: Diagnosis not present

## 2023-10-10 DIAGNOSIS — M2041 Other hammer toe(s) (acquired), right foot: Secondary | ICD-10-CM | POA: Diagnosis not present

## 2023-10-10 DIAGNOSIS — M2042 Other hammer toe(s) (acquired), left foot: Secondary | ICD-10-CM | POA: Diagnosis not present

## 2023-10-10 DIAGNOSIS — B351 Tinea unguium: Secondary | ICD-10-CM | POA: Diagnosis not present

## 2023-10-10 DIAGNOSIS — M79672 Pain in left foot: Secondary | ICD-10-CM | POA: Diagnosis not present

## 2023-10-11 ENCOUNTER — Other Ambulatory Visit: Payer: Self-pay | Admitting: Orthopedic Surgery

## 2023-10-11 DIAGNOSIS — F419 Anxiety disorder, unspecified: Secondary | ICD-10-CM

## 2023-10-11 MED ORDER — LORAZEPAM 0.5 MG PO TABS: 0.2500 mg | ORAL_TABLET | Freq: Two times a day (BID) | ORAL | 0 refills | Status: DC

## 2023-10-11 MED ORDER — LORAZEPAM 0.5 MG PO TABS: 0.2500 mg | ORAL_TABLET | Freq: Two times a day (BID) | ORAL | Status: DC

## 2023-10-13 ENCOUNTER — Other Ambulatory Visit: Payer: Self-pay | Admitting: Adult Health

## 2023-10-13 DIAGNOSIS — F419 Anxiety disorder, unspecified: Secondary | ICD-10-CM

## 2023-10-13 DIAGNOSIS — D649 Anemia, unspecified: Secondary | ICD-10-CM | POA: Diagnosis not present

## 2023-10-13 LAB — BASIC METABOLIC PANEL WITH GFR
BUN: 16 (ref 4–21)
CO2: 24 — AB (ref 13–22)
Chloride: 104 (ref 99–108)
Creatinine: 0.9 (ref 0.5–1.1)
Glucose: 98
Potassium: 4 meq/L (ref 3.5–5.1)
Sodium: 141 (ref 137–147)

## 2023-10-13 LAB — CBC AND DIFFERENTIAL
HCT: 38 (ref 36–46)
Hemoglobin: 12.6 (ref 12.0–16.0)
Platelets: 174 K/uL (ref 150–400)
WBC: 5.6

## 2023-10-13 LAB — HEPATIC FUNCTION PANEL
ALT: 17 U/L (ref 7–35)
AST: 22 (ref 13–35)
Alkaline Phosphatase: 69 (ref 25–125)
Bilirubin, Total: 0.2

## 2023-10-13 LAB — COMPREHENSIVE METABOLIC PANEL WITH GFR
Albumin: 3.8 (ref 3.5–5.0)
Calcium: 9.2 (ref 8.7–10.7)
Globulin: 2.4
eGFR: 66

## 2023-10-13 LAB — CBC: RBC: 3.78 — AB (ref 3.87–5.11)

## 2023-10-13 LAB — TSH: TSH: 10.4 — AB (ref 0.41–5.90)

## 2023-10-13 MED ORDER — LORAZEPAM 0.5 MG PO TABS: 0.2500 mg | ORAL_TABLET | Freq: Two times a day (BID) | ORAL | 3 refills | Status: DC

## 2023-10-16 ENCOUNTER — Encounter: Payer: Self-pay | Admitting: Adult Health

## 2023-10-16 ENCOUNTER — Non-Acute Institutional Stay: Admitting: Adult Health

## 2023-10-16 VITALS — BP 120/74 | HR 81 | Temp 97.5°F | Ht 60.0 in | Wt 124.2 lb

## 2023-10-16 DIAGNOSIS — R350 Frequency of micturition: Secondary | ICD-10-CM | POA: Diagnosis not present

## 2023-10-16 DIAGNOSIS — R140 Abdominal distension (gaseous): Secondary | ICD-10-CM

## 2023-10-16 DIAGNOSIS — E039 Hypothyroidism, unspecified: Secondary | ICD-10-CM

## 2023-10-16 DIAGNOSIS — I480 Paroxysmal atrial fibrillation: Secondary | ICD-10-CM

## 2023-10-16 DIAGNOSIS — K5900 Constipation, unspecified: Secondary | ICD-10-CM

## 2023-10-16 DIAGNOSIS — D6869 Other thrombophilia: Secondary | ICD-10-CM | POA: Diagnosis not present

## 2023-10-16 DIAGNOSIS — K219 Gastro-esophageal reflux disease without esophagitis: Secondary | ICD-10-CM

## 2023-10-16 DIAGNOSIS — F419 Anxiety disorder, unspecified: Secondary | ICD-10-CM | POA: Diagnosis not present

## 2023-10-16 DIAGNOSIS — M2011 Hallux valgus (acquired), right foot: Secondary | ICD-10-CM

## 2023-10-16 MED ORDER — LORAZEPAM 0.5 MG PO TABS: 0.2500 mg | ORAL_TABLET | Freq: Two times a day (BID) | ORAL | Status: DC

## 2023-10-16 NOTE — Progress Notes (Unsigned)
 Location:  [facility]  POS: Clinic  Provider: Tawni America, ANP  Code Status: DNR  Goals of Care:     05/17/2023   10:00 AM  Advanced Directives  Does Patient Have a Medical Advance Directive? Yes  Type of Advance Directive Out of facility DNR (pink MOST or yellow form)  Does patient want to make changes to medical advance directive? No - Patient declined     Chief Complaint  Patient presents with  . Follow-up    3 month follow up    HPI: Discussed the use of AI scribe software for clinical note transcription with the patient, who gave verbal consent to proceed.  History of Present Illness           TSH 10.40 10/13/23 Needs dose increase of levothyroxine    Past Medical History:  Diagnosis Date  . Anxiety   . Arthritis   . Bipolar disorder (HCC)   . Depression   . Hyperlipidemia   . Hypothyroidism   . Irritable bowel syndrome   . RA (rheumatoid arthritis) (HCC)     Past Surgical History:  Procedure Laterality Date  . ABDOMINAL HYSTERECTOMY    . Bone Spurs  08/07/13   Right Shoulder  . COLONOSCOPY    . COLONOSCOPY N/A 05/08/2012   Procedure: COLONOSCOPY;  Surgeon: Claudis RAYMOND Rivet, MD;  [facility]: [facility];  Service: Endoscopy;  Laterality: N/A;  730  . COLOSTOMY    . ESOPHAGOGASTRODUODENOSCOPY N/A 11/04/2013   Procedure: ESOPHAGOGASTRODUODENOSCOPY (EGD);  Surgeon: Claudis RAYMOND Rivet, MD;  [facility];  Service: Endoscopy;  Laterality: N/A;  730  . EYE SURGERY  cataract x 2  . MALONEY DILATION N/A 11/04/2013   Procedure: AGAPITO DILATION;  Surgeon: Claudis RAYMOND Rivet, MD;  [facility];  Service: Endoscopy;  Laterality: N/A;  . UPPER GASTROINTESTINAL ENDOSCOPY      Allergies  Allergen Reactions  . Penicillins Itching and Rash  . Hydrocodone Other (See Comments)    MAO-I ; cannot take medications.   . Morphine  And Codeine     Unable to take due to MAO-I  . Morphine  Sulfate Other (See Comments)  . Sulfamethoxazole-Trimethoprim  Nausea Only, Other (See Comments) and Nausea And Vomiting    And diarrhea.  Other Reaction(s): GI Intolerance    Outpatient Encounter Medications as of 10/16/2023  Medication Sig  . acetaminophen  (TYLENOL ) 325 MG tablet Take 2 tablets (650 mg total) by mouth every morning.  . acetaminophen  (TYLENOL ) 500 MG tablet Take 1,000 mg by mouth 3 (three) times daily as needed (joint pain).  . amiodarone  (PACERONE ) 200 MG tablet Take 1 tablet (200 mg total) by mouth daily.  . apixaban  (ELIQUIS ) 2.5 MG TABS tablet Take 1 tablet (2.5 mg total) by mouth 2 (two) times daily.  . B Complex-C (B-COMPLEX WITH VITAMIN C) tablet Take 1 tablet by mouth in the morning.  . Cholecalciferol (VITAMIN D ) 50 MCG (2000 UT) tablet Take 2,000 Units by mouth in the morning.  . conjugated estrogens  (PREMARIN ) vaginal cream Place 1 Applicatorful vaginally 3 (three) times a week.  . cyanocobalamin  (VITAMIN B12) 1000 MCG tablet Take 1,000 mcg by mouth in the morning.  . diclofenac  Sodium (VOLTAREN ) 1 % GEL Apply 2 g topically in the morning, at noon, and at bedtime for 14 days.  . docusate sodium  (COLACE) 100 MG capsule Take 1 capsule (100 mg total) by mouth 2 (two) times daily.  SABRA L-Methylfolate-Algae (L-METHYLFOLATE FORTE) 15-90.314 MG CAPS Take 1 capsule by  mouth in the morning.  . levothyroxine  (SYNTHROID ) 88 MCG tablet Take 88 mcg by mouth daily before breakfast.  . melatonin 5 MG TABS Take 5 mg by mouth at bedtime.  . metoprolol  succinate (TOPROL -XL) 25 MG 24 hr tablet Take 2 tablets (50 mg total) by mouth in the morning.  . Multiple Vitamins-Minerals (MULTIVITAMIN WITH MINERALS) tablet Take 1 tablet by mouth in the morning.  . mupirocin ointment (BACTROBAN) 2 % 1 Application 2 (two) times a week.  . nitrofurantoin  (MACRODANTIN ) 50 MG capsule Take 1 capsule (50 mg total) by mouth at bedtime.  . pantoprazole  (PROTONIX ) 40 MG tablet Take 1 tablet (40 mg total) by mouth daily. Before dinner.  . polyethylene glycol (MIRALAX  /  GLYCOLAX ) 17 g packet Take 17 g by mouth daily.  . REXULTI  1 MG TABS tablet Take 1 mg by mouth at bedtime.  . venlafaxine  XR (EFFEXOR -XR) 150 MG 24 hr capsule Take 150 mg by mouth in the morning. (Patient taking differently: Take 75 mg by mouth in the morning. Give 3 capsule by mouth in the morning for 3 capsules of 75mg  each to equal 225mg  every AM related to MAJOR DEPRESSIVE DISORDER, RECURRENT, UNSPECIFIED (F33.9))  . [DISCONTINUED] LORazepam  (ATIVAN ) 0.5 MG tablet Take 0.5 tablets (0.25 mg total) by mouth 2 (two) times daily. Give every morning and at bedtime (Patient taking differently: Take 0.25 mg by mouth 2 (two) times daily. Give 0.5 mg by mouth at bedtime for Anxiety)  . benztropine  (COGENTIN ) 0.5 MG tablet Take 0.5 mg by mouth at bedtime. (Patient not taking: Reported on 10/13/2023)  . [DISCONTINUED] LORazepam  (ATIVAN ) 0.5 MG tablet Take 0.5 tablets (0.25 mg total) by mouth at bedtime. And every day prn   No facility-administered encounter medications on file as of 10/16/2023.    Review of Systems:  Review of Systems  Health Maintenance  Topic Date Due  . Zoster Vaccines- Shingrix (1 of 2) Never done  . INFLUENZA VACCINE  10/13/2023  . COVID-19 Vaccine (4 - 2024-25 season) 01/03/2024 (Originally 02/28/2023)  . Medicare Annual Wellness (AWV)  01/31/2024  . DTaP/Tdap/Td (3 - Td or Tdap) 07/12/2032  . Pneumococcal Vaccine: 50+ Years  Completed  . DEXA SCAN  Completed  . Hepatitis B Vaccines  Aged Out  . HPV VACCINES  Aged Out  . Meningococcal B Vaccine  Aged Out    Physical Exam: There were no vitals filed for this visit. There is no height or weight on file to calculate BMI. Physical Exam  Labs reviewed: Basic Metabolic Panel: Recent Labs    05/13/23 0250 05/14/23 0320 05/15/23 0335 05/16/23 0520 05/17/23 0525 05/18/23 0345 09/07/23 1203  NA 134*   < > 139 137 138 135 141  K 3.0*   < > 3.6 4.4 4.2 4.1 4.4  CL 96*   < > 104 105 101 105 101  CO2 21*   < > 25 24 27 26  20   GLUCOSE 240*   < > 93 95 119* 110* 84  BUN 10   < > 7* 5* 10 9 15   CREATININE 0.75   < > 0.66 0.69 0.79 0.71 0.82  CALCIUM 7.3*   < > 7.8* 8.3* 8.7* 8.4* 9.7  MG 1.5*   < > 1.7 2.0 1.9 1.9  --   PHOS  --    < > 2.0* 3.0 3.6  --   --   TSH 4.600*  --   --   --   --   --  10.500*   < > = values in this interval not displayed.   Liver Function Tests: Recent Labs    05/08/23 2120 05/09/23 0747 05/13/23 0250 05/16/23 0520 05/17/23 0525 09/07/23 1203  AST 17 26  --   --   --  19  ALT 13 14  --   --   --  16  ALKPHOS 67 50  --   --   --  88  BILITOT 0.4 0.7  --   --   --  <0.2  PROT 8.1 7.0  --   --   --  7.6  ALBUMIN  4.6 3.3*   < > 3.0* 3.0* 4.4   < > = values in this interval not displayed.   Recent Labs    05/08/23 2120  LIPASE <10*   No results for input(s): AMMONIA in the last 8760 hours. CBC: Recent Labs    05/13/23 0250 05/14/23 0320 05/15/23 0335 05/16/23 0520 05/17/23 0525  WBC 11.6* 12.9* 9.3 10.7* 11.8*  NEUTROABS 8.5* 8.1* 7.0  --   --   HGB 11.5* 10.3* 8.8* 9.0* 9.6*  HCT 33.4* 30.1* 26.3* 27.6* 28.8*  MCV 94.4 95.3 96.3 97.9 97.3  PLT 120* 127* 141* 171 190   Lipid Panel: No results for input(s): CHOL, HDL, LDLCALC, TRIG, CHOLHDL, LDLDIRECT in the last 8760 hours. No results found for: HGBA1C  Procedures since last visit: No results found.  Assessment/Plan Assessment and Plan               Labs/tests ordered:  * No order type specified * Next appt:  Visit date not found   Total time **:  time greater than 50% of total time spent doing pt counseling and coordination of care

## 2023-10-17 ENCOUNTER — Encounter: Payer: Self-pay | Admitting: Adult Health

## 2023-10-20 ENCOUNTER — Other Ambulatory Visit: Payer: Self-pay | Admitting: Adult Health

## 2023-10-20 DIAGNOSIS — F419 Anxiety disorder, unspecified: Secondary | ICD-10-CM

## 2023-10-20 MED ORDER — LORAZEPAM 0.5 MG PO TABS: 0.2500 mg | ORAL_TABLET | Freq: Every day | ORAL | 2 refills | Status: DC

## 2023-10-20 MED ORDER — LORAZEPAM 0.5 MG PO TABS: 0.5000 mg | ORAL_TABLET | Freq: Every day | ORAL | 2 refills | Status: DC

## 2023-10-30 ENCOUNTER — Non-Acute Institutional Stay: Admitting: Adult Health

## 2023-10-30 ENCOUNTER — Encounter: Payer: Self-pay | Admitting: Adult Health

## 2023-10-30 VITALS — BP 124/80 | HR 80 | Temp 98.4°F | Ht 60.0 in | Wt 124.0 lb

## 2023-10-30 DIAGNOSIS — B49 Unspecified mycosis: Secondary | ICD-10-CM | POA: Diagnosis not present

## 2023-10-30 DIAGNOSIS — L03119 Cellulitis of unspecified part of limb: Secondary | ICD-10-CM

## 2023-10-30 MED ORDER — DOXYCYCLINE HYCLATE 100 MG PO TABS: 100.0000 mg | ORAL_TABLET | Freq: Two times a day (BID) | ORAL | Status: AC

## 2023-10-30 NOTE — Progress Notes (Signed)
 Location:  [facility]  POS: Clinic  Provider: Tawni America, ANP  Code Status: DNR Goals of Care:     05/17/2023   10:00 AM  Advanced Directives  Does Patient Have a Medical Advance Directive? Yes  Type of Advance Directive Out of facility DNR (pink MOST or yellow form)  Does patient want to make changes to medical advance directive? No - Patient declined     Chief Complaint  Patient presents with   Foot Pain    Pain in right foot. Pain has been going on for 3weeks    HPI: Discussed the use of AI scribe software for clinical note transcription with the patient, who gave verbal consent to proceed.  History of Present Illness Erica [name] is an 89 year old female who presents with right foot pain and cellulitis.  Right foot pain and deformity - Significant pain localized to the right foot, particularly around the second toe - Second toe is misaligned and displaced over the third toe due to hallux valgus - Pain is exacerbated by ambulation - Voltaren  gel applied for two weeks, resulting in pain relief and improved mobility  Interdigital wound and suspected infection - Wound present between the first and second toes of the right foot - Suspected infection complicating wound healing      Past Medical History:  Diagnosis Date   Anxiety    Arthritis    Bipolar disorder (HCC)    Depression    Hyperlipidemia    Hypothyroidism    Irritable bowel syndrome    RA (rheumatoid arthritis) (HCC)     Past Surgical History:  Procedure Laterality Date   ABDOMINAL HYSTERECTOMY     Bone Spurs  08/07/13   Right Shoulder   COLONOSCOPY     COLONOSCOPY N/A 05/08/2012   Procedure: COLONOSCOPY;  Surgeon: Claudis RAYMOND Rivet, MD;  Location: [facility];  Service: Endoscopy;  Laterality: N/A;  730   COLOSTOMY     ESOPHAGOGASTRODUODENOSCOPY N/A 11/04/2013   Procedure: ESOPHAGOGASTRODUODENOSCOPY (EGD);  Surgeon: Claudis RAYMOND Rivet, MD;  Location: [facility];  Service: Endoscopy;   Laterality: N/A;  730   EYE SURGERY  cataract x 2   MALONEY DILATION N/A 11/04/2013   Procedure: MALONEY DILATION;  Surgeon: Claudis RAYMOND Rivet, MD;  Location: [facility];  Service: Endoscopy;  Laterality: N/A;   UPPER GASTROINTESTINAL ENDOSCOPY      Allergies  Allergen Reactions   Penicillins Itching and Rash   Hydrocodone Other (See Comments)    MAO-I ; cannot take medications.    Morphine  And Codeine     Unable to take due to MAO-I   Morphine  Sulfate Other (See Comments)   Sulfamethoxazole-Trimethoprim Nausea Only, Other (See Comments) and Nausea And Vomiting    And diarrhea.  Other Reaction(s): GI Intolerance    Outpatient Encounter Medications as of 10/30/2023  Medication Sig   acetaminophen  (TYLENOL ) 325 MG tablet Take 2 tablets (650 mg total) by mouth every morning.   amiodarone  (PACERONE ) 200 MG tablet Take 1 tablet (200 mg total) by mouth daily.   apixaban  (ELIQUIS ) 2.5 MG TABS tablet Take 1 tablet (2.5 mg total) by mouth 2 (two) times daily.   B Complex-C (B-COMPLEX WITH VITAMIN C) tablet Take 1 tablet by mouth in the morning.   Cholecalciferol (VITAMIN D ) 50 MCG (2000 UT) tablet Take 2,000 Units by mouth in the morning.   cyanocobalamin  (VITAMIN B12) 1000 MCG tablet Take 1,000 mcg by mouth in the morning.   docusate  sodium (COLACE) 100 MG capsule Take 1 capsule (100 mg total) by mouth 2 (two) times daily.   estradiol  (ESTRACE ) 0.1 MG/GM vaginal cream Place 1 Applicatorful vaginally 3 (three) times a week.   L-Methylfolate-Algae (L-METHYLFOLATE FORTE) 15-90.314 MG CAPS Take 1 capsule by mouth in the morning.   levothyroxine  (SYNTHROID ) 88 MCG tablet Take 88 mcg by mouth daily before breakfast. (Patient taking differently: Take 100 mcg by mouth daily before breakfast.)   LORazepam  (ATIVAN ) 0.5 MG tablet Take 0.5 tablets (0.25 mg total) by mouth daily.   LORazepam  (ATIVAN ) 0.5 MG tablet Take 1 tablet (0.5 mg total) by mouth at bedtime.   melatonin 5 MG TABS Take 5 mg by mouth  at bedtime.   metoprolol  succinate (TOPROL -XL) 25 MG 24 hr tablet Take 2 tablets (50 mg total) by mouth in the morning.   Multiple Vitamins-Minerals (MULTIVITAMIN WITH MINERALS) tablet Take 1 tablet by mouth in the morning.   nitrofurantoin  (MACRODANTIN ) 50 MG capsule Take 1 capsule (50 mg total) by mouth at bedtime.   pantoprazole  (PROTONIX ) 40 MG tablet Take 1 tablet (40 mg total) by mouth daily. Before dinner.   polyethylene glycol (MIRALAX  / GLYCOLAX ) 17 g packet Take 17 g by mouth daily.   REXULTI  1 MG TABS tablet Take 1 mg by mouth at bedtime.   venlafaxine  XR (EFFEXOR -XR) 150 MG 24 hr capsule Take 150 mg by mouth in the morning. (Patient taking differently: Take 75 mg by mouth in the morning. Give 3 capsule by mouth in the morning for 3 capsules of 75mg  each to equal 225mg  every AM related to MAJOR DEPRESSIVE DISORDER, RECURRENT, UNSPECIFIED (F33.9))   acetaminophen  (TYLENOL ) 500 MG tablet Take 1,000 mg by mouth 3 (three) times daily as needed (joint pain).   conjugated estrogens  (PREMARIN ) vaginal cream Place 1 Applicatorful vaginally 3 (three) times a week. (Patient not taking: Reported on 10/30/2023)   mupirocin ointment (BACTROBAN) 2 % 1 Application 2 (two) times a week. (Patient not taking: Reported on 10/30/2023)   No facility-administered encounter medications on file as of 10/30/2023.    Review of Systems:  Review of Systems  Constitutional:  Negative for activity change, appetite change, chills, diaphoresis, fatigue, fever and unexpected weight change.  Respiratory:  Negative for cough and shortness of breath.   Musculoskeletal:  Positive for arthralgias and gait problem.  Skin:  Positive for color change and wound. Negative for pallor and rash.  Psychiatric/Behavioral:  Negative for confusion.     Health Maintenance  Topic Date Due   Zoster Vaccines- Shingrix (1 of 2) Never done   INFLUENZA VACCINE  10/13/2023   COVID-19 Vaccine (4 - 2024-25 season) 01/03/2024 (Originally  02/28/2023)   Medicare Annual Wellness (AWV)  01/31/2024   DTaP/Tdap/Td (3 - Td or Tdap) 07/12/2032   Pneumococcal Vaccine: 50+ Years  Completed   DEXA SCAN  Completed   HPV VACCINES  Aged Out   Meningococcal B Vaccine  Aged Out    Physical Exam: Vitals:   10/30/23 1100  BP: 124/80  Pulse: 80  Temp: 98.4 F (36.9 C)  SpO2: 95%  Weight: 124 lb (56.2 kg)  Height: 5' (1.524 m)   Body mass index is 24.22 kg/m. Physical Exam Vitals and nursing note reviewed.  Constitutional:      Appearance: Normal appearance.  Cardiovascular:     Pulses:          Dorsalis pedis pulses are 1+ on the right side and 1+ on the left side.  Feet:  Comments: Right foot with hallux valgus Between 1st and 2nd digit wound white wound bed with yellow discharge. Mild surrounding erythema.  Not tender No spreading redness  Skin:    General: Skin is warm and dry.     Comments: Rubor to both feet  Neurological:     Mental Status: She is alert.     Labs reviewed: Basic Metabolic Panel: Recent Labs    05/13/23 0250 05/14/23 0320 05/15/23 0335 05/16/23 0520 05/17/23 0525 05/18/23 0345 09/07/23 1203 10/13/23 0000  NA 134*   < > 139 137 138 135 141 141  K 3.0*   < > 3.6 4.4 4.2 4.1 4.4 4.0  CL 96*   < > 104 105 101 105 101 104  CO2 21*   < > 25 24 27 26 20  24*  GLUCOSE 240*   < > 93 95 119* 110* 84  --   BUN 10   < > 7* 5* 10 9 15 16   CREATININE 0.75   < > 0.66 0.69 0.79 0.71 0.82 0.9  CALCIUM 7.3*   < > 7.8* 8.3* 8.7* 8.4* 9.7 9.2  MG 1.5*   < > 1.7 2.0 1.9 1.9  --   --   PHOS  --    < > 2.0* 3.0 3.6  --   --   --   TSH 4.600*  --   --   --   --   --  10.500* 10.40*   < > = values in this interval not displayed.   Liver Function Tests: Recent Labs    05/08/23 2120 05/09/23 0747 05/13/23 0250 05/17/23 0525 09/07/23 1203 10/13/23 0000  AST 17 26  --   --  19 22  ALT 13 14  --   --  16 17  ALKPHOS 67 50  --   --  88 69  BILITOT 0.4 0.7  --   --  <0.2  --   PROT 8.1 7.0  --    --  7.6  --   ALBUMIN  4.6 3.3*   < > 3.0* 4.4 3.8   < > = values in this interval not displayed.   Recent Labs    05/08/23 2120  LIPASE <10*   No results for input(s): AMMONIA in the last 8760 hours. CBC: Recent Labs    05/13/23 0250 05/14/23 0320 05/15/23 0335 05/16/23 0520 05/17/23 0525 10/13/23 0000  WBC 11.6* 12.9* 9.3 10.7* 11.8* 5.6  NEUTROABS 8.5* 8.1* 7.0  --   --   --   HGB 11.5* 10.3* 8.8* 9.0* 9.6* 12.6  HCT 33.4* 30.1* 26.3* 27.6* 28.8* 38  MCV 94.4 95.3 96.3 97.9 97.3  --   PLT 120* 127* 141* 171 190 174   Lipid Panel: No results for input(s): CHOL, HDL, LDLCALC, TRIG, CHOLHDL, LDLDIRECT in the last 8760 hours. No results found for: HGBA1C  Procedures since last visit: No results found.  Assessment/Plan Assessment and Plan Assessment & Plan Cellulitis of right foot with open wound between first and second toes Cellulitis likely due to bacterial infection following a fungal infection, exacerbated by moisture and lack of air circulation. Doxycycline  chosen as the preferred antibiotic. - Prescribe doxycycline  100 mg twice a day for cellulitis. - Instruct nurse to apply and change wound dressing daily. - Schedule follow-up appointment next week to assess the need for additional treatment for fungal infection.  Fungal infection of skin of right foot Fungal infection contributing to cellulitis, likely due to moisture retention  and lack of air circulation. - Advise wearing footwear that allows for better air circulation and reduces moisture retention. -after treatment with antibiotic will f/u for possible further treatment for fungal component        1 week f/u   Total time :  time greater than 50% of total time spent doing pt counseling and coordination of care

## 2023-11-06 ENCOUNTER — Non-Acute Institutional Stay: Admitting: Adult Health

## 2023-11-06 ENCOUNTER — Encounter: Payer: Self-pay | Admitting: Adult Health

## 2023-11-06 VITALS — BP 118/76 | HR 79 | Temp 97.6°F | Ht 60.0 in | Wt 124.0 lb

## 2023-11-06 DIAGNOSIS — S91109D Unspecified open wound of unspecified toe(s) without damage to nail, subsequent encounter: Secondary | ICD-10-CM | POA: Diagnosis not present

## 2023-11-06 DIAGNOSIS — L03119 Cellulitis of unspecified part of limb: Secondary | ICD-10-CM

## 2023-11-06 DIAGNOSIS — B3731 Acute candidiasis of vulva and vagina: Secondary | ICD-10-CM

## 2023-11-06 MED ORDER — FLUCONAZOLE 150 MG PO TABS: 150.0000 mg | ORAL_TABLET | Freq: Once | ORAL | 0 refills | Status: AC

## 2023-11-06 NOTE — Progress Notes (Signed)
 Location:  [facility]  POS: Clinic  Provider: Tawni America, ANP  Code Status: DNR Goals of Care:     05/17/2023   10:00 AM  Advanced Directives  Does Patient Have a Medical Advance Directive? Yes  Type of Advance Directive Out of facility DNR (pink MOST or yellow form)  Does patient want to make changes to medical advance directive? No - Patient declined     Chief Complaint  Patient presents with   Medical Management of Chronic Issues    1 week follow up. Possible cellulitis of right big toe. Feeling like she has a UTI ( burning and itching). Discuss shingles vaccine     HPI: Discussed the use of AI scribe software for clinical note transcription with the patient, who gave verbal consent to proceed.  Here for follow up after starting doxycycline  for cellulitis of the right foot started 10/30/23 Less redness and less drainage Using aquacel to wound and changing 2-3 days  Has hallux valgus to right foot chronic, has seen podiatry   Reports vaginal irritation and redness.     Past Medical History:  Diagnosis Date   Anxiety    Arthritis    Bipolar disorder (HCC)    Depression    Hyperlipidemia    Hypothyroidism    Irritable bowel syndrome    RA (rheumatoid arthritis) (HCC)     Past Surgical History:  Procedure Laterality Date   ABDOMINAL HYSTERECTOMY     Bone Spurs  08/07/13   Right Shoulder   COLONOSCOPY     COLONOSCOPY N/A 05/08/2012   Procedure: COLONOSCOPY;  Surgeon: Claudis RAYMOND Rivet, MD;  Location: [facility];  Service: Endoscopy;  Laterality: N/A;  730   COLOSTOMY     ESOPHAGOGASTRODUODENOSCOPY N/A 11/04/2013   Procedure: ESOPHAGOGASTRODUODENOSCOPY (EGD);  Surgeon: Claudis RAYMOND Rivet, MD;  Location: [facility];  Service: Endoscopy;  Laterality: N/A;  730   EYE SURGERY  cataract x 2   MALONEY DILATION N/A 11/04/2013   Procedure: MALONEY DILATION;  Surgeon: Claudis RAYMOND Rivet, MD;  Location: [facility];  Service: Endoscopy;  Laterality: N/A;   UPPER  GASTROINTESTINAL ENDOSCOPY      Allergies  Allergen Reactions   Penicillins Itching and Rash   Hydrocodone Other (See Comments)    MAO-I ; cannot take medications.    Morphine  And Codeine     Unable to take due to MAO-I   Morphine  Sulfate Other (See Comments)   Sulfamethoxazole-Trimethoprim Nausea Only, Other (See Comments) and Nausea And Vomiting    And diarrhea.  Other Reaction(s): GI Intolerance    Outpatient Encounter Medications as of 11/06/2023  Medication Sig   acetaminophen  (TYLENOL ) 325 MG tablet Take 2 tablets (650 mg total) by mouth every morning.   amiodarone  (PACERONE ) 200 MG tablet Take 1 tablet (200 mg total) by mouth daily.   apixaban  (ELIQUIS ) 2.5 MG TABS tablet Take 1 tablet (2.5 mg total) by mouth 2 (two) times daily.   B Complex-C (B-COMPLEX WITH VITAMIN C) tablet Take 1 tablet by mouth in the morning.   Cholecalciferol (VITAMIN D ) 50 MCG (2000 UT) tablet Take 2,000 Units by mouth in the morning.   cyanocobalamin  (VITAMIN B12) 1000 MCG tablet Take 1,000 mcg by mouth in the morning.   docusate sodium  (COLACE) 100 MG capsule Take 1 capsule (100 mg total) by mouth 2 (two) times daily.   doxycycline  (VIBRA -TABS) 100 MG tablet Take 1 tablet (100 mg total) by mouth 2 (two) times daily for 7 days.  estradiol  (ESTRACE ) 0.1 MG/GM vaginal cream Place 1 Applicatorful vaginally 3 (three) times a week.   L-Methylfolate-Algae (L-METHYLFOLATE FORTE) 15-90.314 MG CAPS Take 1 capsule by mouth in the morning.   levothyroxine  (SYNTHROID ) 100 MCG tablet Take 100 mcg by mouth daily.   LORazepam  (ATIVAN ) 0.5 MG tablet Take 0.5 tablets (0.25 mg total) by mouth daily.   LORazepam  (ATIVAN ) 0.5 MG tablet Take 1 tablet (0.5 mg total) by mouth at bedtime.   melatonin 5 MG TABS Take 5 mg by mouth at bedtime.   metoprolol  tartrate (LOPRESSOR ) 50 MG tablet Take 50 mg by mouth in the morning.   Multiple Vitamins-Minerals (MULTIVITAMIN WITH MINERALS) tablet Take 1 tablet by mouth in the morning.    nitrofurantoin  (MACRODANTIN ) 50 MG capsule Take 1 capsule (50 mg total) by mouth at bedtime.   pantoprazole  (PROTONIX ) 40 MG tablet Take 1 tablet (40 mg total) by mouth daily. Before dinner.   polyethylene glycol (MIRALAX  / GLYCOLAX ) 17 g packet Take 17 g by mouth daily.   REXULTI  1 MG TABS tablet Take 1 mg by mouth at bedtime.   venlafaxine  XR (EFFEXOR -XR) 75 MG 24 hr capsule Take 3 capsules by mouth daily with breakfast.   acetaminophen  (TYLENOL ) 500 MG tablet Take 1,000 mg by mouth 3 (three) times daily as needed (joint pain).   levothyroxine  (SYNTHROID ) 88 MCG tablet Take 88 mcg by mouth daily before breakfast. (Patient not taking: Reported on 11/06/2023)   metoprolol  succinate (TOPROL -XL) 25 MG 24 hr tablet Take 2 tablets (50 mg total) by mouth in the morning. (Patient not taking: Reported on 11/06/2023)   mupirocin ointment (BACTROBAN) 2 % 1 Application 2 (two) times a week. (Patient not taking: Reported on 11/06/2023)   venlafaxine  XR (EFFEXOR -XR) 150 MG 24 hr capsule Take 150 mg by mouth in the morning. (Patient not taking: Reported on 11/06/2023)   No facility-administered encounter medications on file as of 11/06/2023.    Review of Systems:  Review of Systems  Constitutional:  Negative for activity change, appetite change, chills, diaphoresis, fatigue, fever and unexpected weight change.  Genitourinary:  Positive for vaginal pain (irritation and itching). Negative for decreased urine volume, difficulty urinating, flank pain, frequency, vaginal bleeding and vaginal discharge.  Musculoskeletal:  Positive for arthralgias and gait problem. Negative for back pain.       Foot pain  Psychiatric/Behavioral:  Negative for agitation, behavioral problems and confusion.     Health Maintenance  Topic Date Due   COVID-19 Vaccine (4 - 2024-25 season) 01/03/2024 (Originally 02/28/2023)   INFLUENZA VACCINE  06/11/2024 (Originally 10/13/2023)   Medicare Annual Wellness (AWV)  01/31/2024    DTaP/Tdap/Td (3 - Td or Tdap) 07/12/2032   Pneumococcal Vaccine: 50+ Years  Completed   DEXA SCAN  Completed   Zoster Vaccines- Shingrix  Completed   HPV VACCINES  Aged Out   Meningococcal B Vaccine  Aged Out    Physical Exam: Vitals:   11/06/23 1505  BP: 118/76  Pulse: 79  Temp: 97.6 F (36.4 C)  SpO2: 93%  Weight: 124 lb (56.2 kg)  Height: 5' (1.524 m)   Body mass index is 24.22 kg/m. Physical Exam Vitals reviewed.  Constitutional:      Appearance: Normal appearance.  Cardiovascular:     Pulses:          Dorsalis pedis pulses are 1+ on the right side and 1+ on the left side.  Musculoskeletal:       Feet:     Comments: Trace edema  to both feet  Skin:    General: Skin is warm and dry.  Neurological:     Mental Status: She is alert. Mental status is at baseline.     Labs reviewed: Basic Metabolic Panel: Recent Labs    05/13/23 0250 05/14/23 0320 05/15/23 0335 05/16/23 0520 05/17/23 0525 05/18/23 0345 09/07/23 1203 10/13/23 0000  NA 134*   < > 139 137 138 135 141 141  K 3.0*   < > 3.6 4.4 4.2 4.1 4.4 4.0  CL 96*   < > 104 105 101 105 101 104  CO2 21*   < > 25 24 27 26 20  24*  GLUCOSE 240*   < > 93 95 119* 110* 84  --   BUN 10   < > 7* 5* 10 9 15 16   CREATININE 0.75   < > 0.66 0.69 0.79 0.71 0.82 0.9  CALCIUM 7.3*   < > 7.8* 8.3* 8.7* 8.4* 9.7 9.2  MG 1.5*   < > 1.7 2.0 1.9 1.9  --   --   PHOS  --    < > 2.0* 3.0 3.6  --   --   --   TSH 4.600*  --   --   --   --   --  10.500* 10.40*   < > = values in this interval not displayed.   Liver Function Tests: Recent Labs    05/08/23 2120 05/09/23 0747 05/13/23 0250 05/17/23 0525 09/07/23 1203 10/13/23 0000  AST 17 26  --   --  19 22  ALT 13 14  --   --  16 17  ALKPHOS 67 50  --   --  88 69  BILITOT 0.4 0.7  --   --  <0.2  --   PROT 8.1 7.0  --   --  7.6  --   ALBUMIN  4.6 3.3*   < > 3.0* 4.4 3.8   < > = values in this interval not displayed.   Recent Labs    05/08/23 2120  LIPASE <10*   No  results for input(s): AMMONIA in the last 8760 hours. CBC: Recent Labs    05/13/23 0250 05/14/23 0320 05/15/23 0335 05/16/23 0520 05/17/23 0525 10/13/23 0000  WBC 11.6* 12.9* 9.3 10.7* 11.8* 5.6  NEUTROABS 8.5* 8.1* 7.0  --   --   --   HGB 11.5* 10.3* 8.8* 9.0* 9.6* 12.6  HCT 33.4* 30.1* 26.3* 27.6* 28.8* 38  MCV 94.4 95.3 96.3 97.9 97.3  --   PLT 120* 127* 141* 171 190 174   Lipid Panel: No results for input(s): CHOL, HDL, LDLCALC, TRIG, CHOLHDL, LDLDIRECT in the last 8760 hours. No results found for: HGBA1C  Procedures since last visit: No results found.  Assessment/Plan  1. Cellulitis of foot (Primary) Much improved  2. Toe wound Slightly larger Add toe separator Will check foot xray Wound nurse to follow  2. Vaginal candidiasis Diflucan  150 mg x 1 repeat in 48 hrs     Labs/tests ordered:  * No order type specified * Next appt:  02/13/2024   Total time44min:  time greater than 50% of total time spent doing pt counseling and coordination of care

## 2023-11-10 DIAGNOSIS — R262 Difficulty in walking, not elsewhere classified: Secondary | ICD-10-CM | POA: Diagnosis not present

## 2023-11-10 DIAGNOSIS — M6281 Muscle weakness (generalized): Secondary | ICD-10-CM | POA: Diagnosis not present

## 2023-11-10 DIAGNOSIS — M05649 Rheumatoid arthritis of unspecified hand with involvement of other organs and systems: Secondary | ICD-10-CM | POA: Diagnosis not present

## 2023-11-15 DIAGNOSIS — M05649 Rheumatoid arthritis of unspecified hand with involvement of other organs and systems: Secondary | ICD-10-CM | POA: Diagnosis not present

## 2023-11-15 DIAGNOSIS — R262 Difficulty in walking, not elsewhere classified: Secondary | ICD-10-CM | POA: Diagnosis not present

## 2023-11-15 DIAGNOSIS — M6281 Muscle weakness (generalized): Secondary | ICD-10-CM | POA: Diagnosis not present

## 2023-11-16 DIAGNOSIS — R262 Difficulty in walking, not elsewhere classified: Secondary | ICD-10-CM | POA: Diagnosis not present

## 2023-11-16 DIAGNOSIS — M05649 Rheumatoid arthritis of unspecified hand with involvement of other organs and systems: Secondary | ICD-10-CM | POA: Diagnosis not present

## 2023-11-16 DIAGNOSIS — M6281 Muscle weakness (generalized): Secondary | ICD-10-CM | POA: Diagnosis not present

## 2023-11-20 DIAGNOSIS — M05649 Rheumatoid arthritis of unspecified hand with involvement of other organs and systems: Secondary | ICD-10-CM | POA: Diagnosis not present

## 2023-11-20 DIAGNOSIS — M6281 Muscle weakness (generalized): Secondary | ICD-10-CM | POA: Diagnosis not present

## 2023-11-20 DIAGNOSIS — R262 Difficulty in walking, not elsewhere classified: Secondary | ICD-10-CM | POA: Diagnosis not present

## 2023-11-22 DIAGNOSIS — M05649 Rheumatoid arthritis of unspecified hand with involvement of other organs and systems: Secondary | ICD-10-CM | POA: Diagnosis not present

## 2023-11-22 DIAGNOSIS — R262 Difficulty in walking, not elsewhere classified: Secondary | ICD-10-CM | POA: Diagnosis not present

## 2023-11-22 DIAGNOSIS — M6281 Muscle weakness (generalized): Secondary | ICD-10-CM | POA: Diagnosis not present

## 2023-11-27 DIAGNOSIS — M05649 Rheumatoid arthritis of unspecified hand with involvement of other organs and systems: Secondary | ICD-10-CM | POA: Diagnosis not present

## 2023-11-27 DIAGNOSIS — M6281 Muscle weakness (generalized): Secondary | ICD-10-CM | POA: Diagnosis not present

## 2023-11-27 DIAGNOSIS — R262 Difficulty in walking, not elsewhere classified: Secondary | ICD-10-CM | POA: Diagnosis not present

## 2023-11-29 DIAGNOSIS — M6281 Muscle weakness (generalized): Secondary | ICD-10-CM | POA: Diagnosis not present

## 2023-11-29 DIAGNOSIS — M05649 Rheumatoid arthritis of unspecified hand with involvement of other organs and systems: Secondary | ICD-10-CM | POA: Diagnosis not present

## 2023-11-29 DIAGNOSIS — R262 Difficulty in walking, not elsewhere classified: Secondary | ICD-10-CM | POA: Diagnosis not present

## 2023-12-04 DIAGNOSIS — R262 Difficulty in walking, not elsewhere classified: Secondary | ICD-10-CM | POA: Diagnosis not present

## 2023-12-04 DIAGNOSIS — M05649 Rheumatoid arthritis of unspecified hand with involvement of other organs and systems: Secondary | ICD-10-CM | POA: Diagnosis not present

## 2023-12-04 DIAGNOSIS — M6281 Muscle weakness (generalized): Secondary | ICD-10-CM | POA: Diagnosis not present

## 2023-12-06 DIAGNOSIS — R262 Difficulty in walking, not elsewhere classified: Secondary | ICD-10-CM | POA: Diagnosis not present

## 2023-12-06 DIAGNOSIS — M05649 Rheumatoid arthritis of unspecified hand with involvement of other organs and systems: Secondary | ICD-10-CM | POA: Diagnosis not present

## 2023-12-06 DIAGNOSIS — M6281 Muscle weakness (generalized): Secondary | ICD-10-CM | POA: Diagnosis not present

## 2023-12-11 DIAGNOSIS — R262 Difficulty in walking, not elsewhere classified: Secondary | ICD-10-CM | POA: Diagnosis not present

## 2023-12-11 DIAGNOSIS — M6281 Muscle weakness (generalized): Secondary | ICD-10-CM | POA: Diagnosis not present

## 2023-12-11 DIAGNOSIS — M05649 Rheumatoid arthritis of unspecified hand with involvement of other organs and systems: Secondary | ICD-10-CM | POA: Diagnosis not present

## 2023-12-13 DIAGNOSIS — M05649 Rheumatoid arthritis of unspecified hand with involvement of other organs and systems: Secondary | ICD-10-CM | POA: Diagnosis not present

## 2023-12-13 DIAGNOSIS — R262 Difficulty in walking, not elsewhere classified: Secondary | ICD-10-CM | POA: Diagnosis not present

## 2023-12-13 DIAGNOSIS — M6281 Muscle weakness (generalized): Secondary | ICD-10-CM | POA: Diagnosis not present

## 2023-12-15 DIAGNOSIS — M6281 Muscle weakness (generalized): Secondary | ICD-10-CM | POA: Diagnosis not present

## 2023-12-15 DIAGNOSIS — M05649 Rheumatoid arthritis of unspecified hand with involvement of other organs and systems: Secondary | ICD-10-CM | POA: Diagnosis not present

## 2023-12-15 DIAGNOSIS — R262 Difficulty in walking, not elsewhere classified: Secondary | ICD-10-CM | POA: Diagnosis not present

## 2023-12-18 DIAGNOSIS — M6281 Muscle weakness (generalized): Secondary | ICD-10-CM | POA: Diagnosis not present

## 2023-12-18 DIAGNOSIS — M05649 Rheumatoid arthritis of unspecified hand with involvement of other organs and systems: Secondary | ICD-10-CM | POA: Diagnosis not present

## 2023-12-18 DIAGNOSIS — R262 Difficulty in walking, not elsewhere classified: Secondary | ICD-10-CM | POA: Diagnosis not present

## 2023-12-20 DIAGNOSIS — M05649 Rheumatoid arthritis of unspecified hand with involvement of other organs and systems: Secondary | ICD-10-CM | POA: Diagnosis not present

## 2023-12-20 DIAGNOSIS — R262 Difficulty in walking, not elsewhere classified: Secondary | ICD-10-CM | POA: Diagnosis not present

## 2023-12-20 DIAGNOSIS — M6281 Muscle weakness (generalized): Secondary | ICD-10-CM | POA: Diagnosis not present

## 2023-12-25 DIAGNOSIS — R262 Difficulty in walking, not elsewhere classified: Secondary | ICD-10-CM | POA: Diagnosis not present

## 2023-12-25 DIAGNOSIS — M6281 Muscle weakness (generalized): Secondary | ICD-10-CM | POA: Diagnosis not present

## 2023-12-25 DIAGNOSIS — M05649 Rheumatoid arthritis of unspecified hand with involvement of other organs and systems: Secondary | ICD-10-CM | POA: Diagnosis not present

## 2023-12-27 ENCOUNTER — Encounter (INDEPENDENT_AMBULATORY_CARE_PROVIDER_SITE_OTHER): Payer: Self-pay | Admitting: Gastroenterology

## 2023-12-27 DIAGNOSIS — R262 Difficulty in walking, not elsewhere classified: Secondary | ICD-10-CM | POA: Diagnosis not present

## 2023-12-27 DIAGNOSIS — M6281 Muscle weakness (generalized): Secondary | ICD-10-CM | POA: Diagnosis not present

## 2023-12-27 DIAGNOSIS — M05649 Rheumatoid arthritis of unspecified hand with involvement of other organs and systems: Secondary | ICD-10-CM | POA: Diagnosis not present

## 2023-12-29 DIAGNOSIS — M6281 Muscle weakness (generalized): Secondary | ICD-10-CM | POA: Diagnosis not present

## 2023-12-29 DIAGNOSIS — M05649 Rheumatoid arthritis of unspecified hand with involvement of other organs and systems: Secondary | ICD-10-CM | POA: Diagnosis not present

## 2023-12-29 DIAGNOSIS — R262 Difficulty in walking, not elsewhere classified: Secondary | ICD-10-CM | POA: Diagnosis not present

## 2024-01-01 DIAGNOSIS — M6281 Muscle weakness (generalized): Secondary | ICD-10-CM | POA: Diagnosis not present

## 2024-01-01 DIAGNOSIS — R262 Difficulty in walking, not elsewhere classified: Secondary | ICD-10-CM | POA: Diagnosis not present

## 2024-01-01 DIAGNOSIS — M05649 Rheumatoid arthritis of unspecified hand with involvement of other organs and systems: Secondary | ICD-10-CM | POA: Diagnosis not present

## 2024-01-03 DIAGNOSIS — M6281 Muscle weakness (generalized): Secondary | ICD-10-CM | POA: Diagnosis not present

## 2024-01-03 DIAGNOSIS — M05649 Rheumatoid arthritis of unspecified hand with involvement of other organs and systems: Secondary | ICD-10-CM | POA: Diagnosis not present

## 2024-01-03 DIAGNOSIS — R262 Difficulty in walking, not elsewhere classified: Secondary | ICD-10-CM | POA: Diagnosis not present

## 2024-01-08 DIAGNOSIS — M05649 Rheumatoid arthritis of unspecified hand with involvement of other organs and systems: Secondary | ICD-10-CM | POA: Diagnosis not present

## 2024-01-08 DIAGNOSIS — M6281 Muscle weakness (generalized): Secondary | ICD-10-CM | POA: Diagnosis not present

## 2024-01-08 DIAGNOSIS — R262 Difficulty in walking, not elsewhere classified: Secondary | ICD-10-CM | POA: Diagnosis not present

## 2024-01-08 DIAGNOSIS — E039 Hypothyroidism, unspecified: Secondary | ICD-10-CM | POA: Diagnosis not present

## 2024-01-08 LAB — TSH: TSH: 3.38 (ref 0.41–5.90)

## 2024-01-10 DIAGNOSIS — M6281 Muscle weakness (generalized): Secondary | ICD-10-CM | POA: Diagnosis not present

## 2024-01-10 DIAGNOSIS — M05649 Rheumatoid arthritis of unspecified hand with involvement of other organs and systems: Secondary | ICD-10-CM | POA: Diagnosis not present

## 2024-01-10 DIAGNOSIS — R262 Difficulty in walking, not elsewhere classified: Secondary | ICD-10-CM | POA: Diagnosis not present

## 2024-01-15 DIAGNOSIS — M6281 Muscle weakness (generalized): Secondary | ICD-10-CM | POA: Diagnosis not present

## 2024-01-15 DIAGNOSIS — M05649 Rheumatoid arthritis of unspecified hand with involvement of other organs and systems: Secondary | ICD-10-CM | POA: Diagnosis not present

## 2024-01-15 DIAGNOSIS — H5213 Myopia, bilateral: Secondary | ICD-10-CM | POA: Diagnosis not present

## 2024-01-15 DIAGNOSIS — R262 Difficulty in walking, not elsewhere classified: Secondary | ICD-10-CM | POA: Diagnosis not present

## 2024-01-17 DIAGNOSIS — M6281 Muscle weakness (generalized): Secondary | ICD-10-CM | POA: Diagnosis not present

## 2024-01-17 DIAGNOSIS — M05649 Rheumatoid arthritis of unspecified hand with involvement of other organs and systems: Secondary | ICD-10-CM | POA: Diagnosis not present

## 2024-01-17 DIAGNOSIS — R262 Difficulty in walking, not elsewhere classified: Secondary | ICD-10-CM | POA: Diagnosis not present

## 2024-01-22 DIAGNOSIS — M6281 Muscle weakness (generalized): Secondary | ICD-10-CM | POA: Diagnosis not present

## 2024-01-22 DIAGNOSIS — R262 Difficulty in walking, not elsewhere classified: Secondary | ICD-10-CM | POA: Diagnosis not present

## 2024-01-22 DIAGNOSIS — M05649 Rheumatoid arthritis of unspecified hand with involvement of other organs and systems: Secondary | ICD-10-CM | POA: Diagnosis not present

## 2024-01-24 DIAGNOSIS — M05649 Rheumatoid arthritis of unspecified hand with involvement of other organs and systems: Secondary | ICD-10-CM | POA: Diagnosis not present

## 2024-01-24 DIAGNOSIS — R262 Difficulty in walking, not elsewhere classified: Secondary | ICD-10-CM | POA: Diagnosis not present

## 2024-01-24 DIAGNOSIS — M6281 Muscle weakness (generalized): Secondary | ICD-10-CM | POA: Diagnosis not present

## 2024-01-25 ENCOUNTER — Encounter: Payer: Self-pay | Admitting: Adult Health

## 2024-01-25 ENCOUNTER — Non-Acute Institutional Stay: Payer: Self-pay | Admitting: Adult Health

## 2024-01-25 DIAGNOSIS — J069 Acute upper respiratory infection, unspecified: Secondary | ICD-10-CM | POA: Diagnosis not present

## 2024-01-25 MED ORDER — CETIRIZINE HCL 10 MG PO TABS: 5.0000 mg | ORAL_TABLET | Freq: Every day | ORAL | Status: DC

## 2024-01-25 NOTE — Progress Notes (Signed)
 Location:  [facility] Illustr[facility] of Service:  ALF (13) Provider:   Bari America, ANP [facility] [phone]   Charlanne Fredia CROME, MD  Patient Care Team: Charlanne Fredia CROME, MD as PCP - General (Internal Medicine) Lavona Agent, MD as PCP - Cardiology (Cardiology) Vincente Grip, MD as Consulting Physician (Psychiatry) Sheryle Carwin, MD as Consulting Physician (Internal Medicine)  Extended Emergency Contact Information Primary Emergency Contact: Eduardo, [place], [place] [address] [place] Home Phone: [phone] Mobile Phone: [phone] Relation: Son Secondary Emergency Contact: Schlotzhauer,sandy Mobile Phone: [phone] Relation: Son  Code Status:  DNR Goals of care: Advanced Directive information    05/17/2023   10:00 AM  Advanced Directives  Does Patient Have a Medical Advance Directive? Yes  Type of Advance Directive Out of facility DNR (pink MOST or yellow form)  Does patient want to make changes to medical advance directive? No - Patient declined     Chief Complaint  Patient presents with   Acute Visit    cough    HPI:  Pt is a 89 y.o. female seen today for an acute visit for cough and runny nose Symptoms present for two days Taking zyrtec and feels it helps No GI symptoms No sob or low 02 sats Adequate appetite Minimal sputum production with cough.  Hx of allergies and feels she needs zyrtec regularly Neg covid and flu swab    Past Medical History:  Diagnosis Date   Anxiety    Arthritis    Bipolar disorder (HCC)    Depression    Hyperlipidemia    Hypothyroidism    Irritable bowel syndrome    RA (rheumatoid arthritis) (HCC)    Past Surgical History:  Procedure Laterality Date   ABDOMINAL HYSTERECTOMY     Bone Spurs  08/07/13   Right Shoulder   COLONOSCOPY     COLONOSCOPY N/A 05/08/2012   Procedure: COLONOSCOPY;  Surgeon: Claudis RAYMOND Rivet, MD;  [facility];  Service: Endoscopy;   Laterality: N/A;  730   COLOSTOMY     ESOPHAGOGASTRODUODENOSCOPY N/A 11/04/2013   Procedure: ESOPHAGOGASTRODUODENOSCOPY (EGD);  Surgeon: Claudis RAYMOND Rivet, MD;  [facility];  Service: Endoscopy;  Laterality: N/A;  730   EYE SURGERY  cataract x 2   MALONEY DILATION N/A 11/04/2013   Procedure: MALONEY DILATION;  Surgeon: Claudis RAYMOND Rivet, MD;  [facility];  Service: Endoscopy;  Laterality: N/A;   UPPER GASTROINTESTINAL ENDOSCOPY      Allergies  Allergen Reactions   Penicillins Itching and Rash   Hydrocodone Other (See Comments)    MAO-I ; cannot take medications.    Morphine  And Codeine     Unable to take due to MAO-I   Morphine  Sulfate Other (See Comments)   Sulfamethoxazole-Trimethoprim Nausea Only, Other (See Comments) and Nausea And Vomiting    And diarrhea.  Other Reaction(s): GI Intolerance    Outpatient Encounter Medications as of 01/25/2024  Medication Sig   cetirizine (ZYRTEC) 10 MG tablet Take 0.5 tablets (5 mg total) by mouth at bedtime for 7 days.   acetaminophen  (TYLENOL ) 325 MG tablet Take 2 tablets (650 mg total) by mouth every morning.   acetaminophen  (TYLENOL ) 500 MG tablet Take 1,000 mg by mouth 3 (three) times daily as needed (joint pain).   amiodarone  (PACERONE ) 200 MG tablet Take 1 tablet (200 mg total) by mouth daily.   apixaban  (ELIQUIS ) 2.5 MG TABS tablet  Take 1 tablet (2.5 mg total) by mouth 2 (two) times daily.   B Complex-C (B-COMPLEX WITH VITAMIN C) tablet Take 1 tablet by mouth in the morning.   Cholecalciferol (VITAMIN D ) 50 MCG (2000 UT) tablet Take 2,000 Units by mouth in the morning.   cyanocobalamin  (VITAMIN B12) 1000 MCG tablet Take 1,000 mcg by mouth in the morning.   docusate sodium  (COLACE) 100 MG capsule Take 1 capsule (100 mg total) by mouth 2 (two) times daily.   estradiol  (ESTRACE ) 0.1 MG/GM vaginal cream Place 1 Applicatorful vaginally 3 (three) times a week.   L-Methylfolate-Algae (L-METHYLFOLATE FORTE) 15-90.314 MG CAPS  Take 1 capsule by mouth in the morning.   levothyroxine  (SYNTHROID ) 100 MCG tablet Take 100 mcg by mouth daily.   LORazepam  (ATIVAN ) 0.5 MG tablet Take 0.5 tablets (0.25 mg total) by mouth daily.   LORazepam  (ATIVAN ) 0.5 MG tablet Take 1 tablet (0.5 mg total) by mouth at bedtime.   melatonin 5 MG TABS Take 5 mg by mouth at bedtime.   metoprolol  succinate (TOPROL -XL) 25 MG 24 hr tablet Take 2 tablets (50 mg total) by mouth in the morning.   Multiple Vitamins-Minerals (MULTIVITAMIN WITH MINERALS) tablet Take 1 tablet by mouth in the morning.   nitrofurantoin  (MACRODANTIN ) 50 MG capsule Take 1 capsule (50 mg total) by mouth at bedtime.   pantoprazole  (PROTONIX ) 40 MG tablet Take 1 tablet (40 mg total) by mouth daily. Before dinner.   polyethylene glycol (MIRALAX  / GLYCOLAX ) 17 g packet Take 17 g by mouth daily.   REXULTI  1 MG TABS tablet Take 1 mg by mouth at bedtime.   venlafaxine  XR (EFFEXOR -XR) 75 MG 24 hr capsule Take 3 capsules by mouth daily with breakfast.   No facility-administered encounter medications on file as of 01/25/2024.    Review of Systems  Constitutional:  Negative for activity change, appetite change, chills, diaphoresis, fatigue and fever.  HENT:  Positive for congestion and rhinorrhea. Negative for ear pain, sinus pressure, sinus pain, sneezing, sore throat and trouble swallowing.   Respiratory:  Positive for cough. Negative for shortness of breath and wheezing.   Cardiovascular:  Negative for chest pain and leg swelling.  Gastrointestinal:  Negative for abdominal distention, abdominal pain, constipation, diarrhea, nausea and vomiting.  Genitourinary:  Negative for difficulty urinating, dysuria and urgency.  Musculoskeletal:  Positive for arthralgias and gait problem. Negative for back pain, myalgias and neck pain.  Skin:  Negative for rash.  Neurological:  Negative for dizziness and weakness.  Psychiatric/Behavioral:  Negative for confusion.     Immunization History   Administered Date(s) Administered   Influenza-Unspecified 01/03/2023   Moderna Covid-19 Vaccine Bivalent Booster 23yrs & up 01/03/2023   Moderna Sars-Covid-2 Vaccination 04/05/2019, 05/01/2019   Pneumococcal Conjugate-13 12/21/2016   Pneumococcal Polysaccharide-23 03/11/2020   Tdap 08/23/2012, 07/13/2022   Zoster Recombinant(Shingrix) 06/09/2020, 09/22/2020   Pertinent  Health Maintenance Due  Topic Date Due   Influenza Vaccine  06/11/2024 (Originally 10/13/2023)   DEXA SCAN  Completed      05/05/2020    8:00 AM 05/05/2020    8:54 PM 05/06/2020   12:00 PM 07/11/2023    2:52 PM 10/16/2023    2:46 PM  Fall Risk  Falls in the past year?    1 0  Was there an injury with Fall?    0 0  Fall Risk Category Calculator    1 0  (RETIRED) Patient Fall Risk Level High fall risk  High fall risk  High fall  risk     Patient at Risk for Falls Due to    Impaired balance/gait;Impaired mobility Impaired mobility  Fall risk Follow up    Falls evaluation completed Falls evaluation completed     Data saved with a previous flowsheet row definition   Functional Status Survey:    Vitals:   01/25/24 1934  BP: 108/68  Pulse: 68  Resp: 16  Temp: (!) 97.5 F (36.4 C)  SpO2: 94%  Weight: 123 lb 9.6 oz (56.1 kg)   Body mass index is 24.14 kg/m. Physical Exam Vitals and nursing note reviewed.  Constitutional:      General: She is not in acute distress.    Appearance: She is not diaphoretic.  HENT:     Head: Normocephalic and atraumatic.     Right Ear: Tympanic membrane normal.     Left Ear: Tympanic membrane normal.     Nose: Congestion present.     Mouth/Throat:     Mouth: Mucous membranes are moist.     Pharynx: Oropharynx is clear.  Eyes:     Conjunctiva/sclera: Conjunctivae normal.     Pupils: Pupils are equal, round, and reactive to light.  Neck:     Vascular: No JVD.  Cardiovascular:     Rate and Rhythm: Normal rate and regular rhythm.     Heart sounds: No murmur heard. Pulmonary:      Effort: Pulmonary effort is normal. No respiratory distress.     Breath sounds: Normal breath sounds. No wheezing.  Skin:    General: Skin is warm and dry.  Neurological:     Mental Status: She is alert. Mental status is at baseline.     Labs reviewed: Recent Labs    05/15/23 0335 05/16/23 0520 05/17/23 0525 05/18/23 0345 09/07/23 1203 10/13/23 0000  NA 139 137 138 135 141 141  K 3.6 4.4 4.2 4.1 4.4 4.0  CL 104 105 101 105 101 104  CO2 25 24 27 26 20  24*  GLUCOSE 93 95 119* 110* 84  --   BUN 7* 5* 10 9 15 16   CREATININE 0.66 0.69 0.79 0.71 0.82 0.9  CALCIUM 7.8* 8.3* 8.7* 8.4* 9.7 9.2  MG 1.7 2.0 1.9 1.9  --   --   PHOS 2.0* 3.0 3.6  --   --   --    Recent Labs    05/08/23 2120 05/09/23 0747 05/13/23 0250 05/17/23 0525 09/07/23 1203 10/13/23 0000  AST 17 26  --   --  19 22  ALT 13 14  --   --  16 17  ALKPHOS 67 50  --   --  88 69  BILITOT 0.4 0.7  --   --  <0.2  --   PROT 8.1 7.0  --   --  7.6  --   ALBUMIN  4.6 3.3*   < > 3.0* 4.4 3.8   < > = values in this interval not displayed.   Recent Labs    05/13/23 0250 05/14/23 0320 05/15/23 0335 05/16/23 0520 05/17/23 0525 10/13/23 0000  WBC 11.6* 12.9* 9.3 10.7* 11.8* 5.6  NEUTROABS 8.5* 8.1* 7.0  --   --   --   HGB 11.5* 10.3* 8.8* 9.0* 9.6* 12.6  HCT 33.4* 30.1* 26.3* 27.6* 28.8* 38  MCV 94.4 95.3 96.3 97.9 97.3  --   PLT 120* 127* 141* 171 190 174   Lab Results  Component Value Date   TSH 10.40 (A) 10/13/2023   No results found  for: HGBA1C No results found for: CHOL, HDL, LDLCALC, LDLDIRECT, TRIG, CHOLHDL  Significant Diagnostic Results in last 30 days:  No results found.  Assessment/Plan 1. Viral upper respiratory illness (Primary) Neg covid and flu Normal lungs sounds and vitals Add zyrtec Monitor vitals Rest fluids.  - cetirizine (ZYRTEC) 10 MG tablet; Take 0.5 tablets (5 mg total) by mouth at bedtime for 7 days.      Labs/tests ordered:  NA   Total time :   time greater than 50% of total time spent doing pt counseling and coordination of care

## 2024-01-26 ENCOUNTER — Other Ambulatory Visit: Payer: Self-pay | Admitting: Adult Health

## 2024-01-26 DIAGNOSIS — F419 Anxiety disorder, unspecified: Secondary | ICD-10-CM

## 2024-01-26 MED ORDER — LORAZEPAM 0.5 MG PO TABS: 0.2500 mg | ORAL_TABLET | Freq: Every day | ORAL | 3 refills | Status: AC

## 2024-01-31 DIAGNOSIS — R262 Difficulty in walking, not elsewhere classified: Secondary | ICD-10-CM | POA: Diagnosis not present

## 2024-01-31 DIAGNOSIS — M05649 Rheumatoid arthritis of unspecified hand with involvement of other organs and systems: Secondary | ICD-10-CM | POA: Diagnosis not present

## 2024-01-31 DIAGNOSIS — M6281 Muscle weakness (generalized): Secondary | ICD-10-CM | POA: Diagnosis not present

## 2024-02-05 DIAGNOSIS — R262 Difficulty in walking, not elsewhere classified: Secondary | ICD-10-CM | POA: Diagnosis not present

## 2024-02-05 DIAGNOSIS — M05649 Rheumatoid arthritis of unspecified hand with involvement of other organs and systems: Secondary | ICD-10-CM | POA: Diagnosis not present

## 2024-02-05 DIAGNOSIS — M6281 Muscle weakness (generalized): Secondary | ICD-10-CM | POA: Diagnosis not present

## 2024-02-07 DIAGNOSIS — M6281 Muscle weakness (generalized): Secondary | ICD-10-CM | POA: Diagnosis not present

## 2024-02-07 DIAGNOSIS — M05649 Rheumatoid arthritis of unspecified hand with involvement of other organs and systems: Secondary | ICD-10-CM | POA: Diagnosis not present

## 2024-02-07 DIAGNOSIS — R262 Difficulty in walking, not elsewhere classified: Secondary | ICD-10-CM | POA: Diagnosis not present

## 2024-02-12 ENCOUNTER — Other Ambulatory Visit: Payer: Self-pay | Admitting: Adult Health

## 2024-02-12 DIAGNOSIS — M05649 Rheumatoid arthritis of unspecified hand with involvement of other organs and systems: Secondary | ICD-10-CM | POA: Diagnosis not present

## 2024-02-12 DIAGNOSIS — M6281 Muscle weakness (generalized): Secondary | ICD-10-CM | POA: Diagnosis not present

## 2024-02-12 DIAGNOSIS — R262 Difficulty in walking, not elsewhere classified: Secondary | ICD-10-CM | POA: Diagnosis not present

## 2024-02-12 MED ORDER — LORAZEPAM 0.5 MG PO TABS: 0.5000 mg | ORAL_TABLET | Freq: Every day | ORAL | 2 refills | Status: AC

## 2024-02-13 ENCOUNTER — Non-Acute Institutional Stay: Payer: Self-pay | Admitting: Internal Medicine

## 2024-02-13 ENCOUNTER — Encounter: Payer: Self-pay | Admitting: Internal Medicine

## 2024-02-13 VITALS — BP 138/80 | HR 81 | Temp 98.0°F | Ht 60.0 in | Wt 123.8 lb

## 2024-02-13 DIAGNOSIS — E039 Hypothyroidism, unspecified: Secondary | ICD-10-CM

## 2024-02-13 DIAGNOSIS — I480 Paroxysmal atrial fibrillation: Secondary | ICD-10-CM

## 2024-02-13 DIAGNOSIS — J3089 Other allergic rhinitis: Secondary | ICD-10-CM | POA: Diagnosis not present

## 2024-02-13 DIAGNOSIS — K219 Gastro-esophageal reflux disease without esophagitis: Secondary | ICD-10-CM | POA: Diagnosis not present

## 2024-02-14 DIAGNOSIS — R262 Difficulty in walking, not elsewhere classified: Secondary | ICD-10-CM | POA: Diagnosis not present

## 2024-02-14 DIAGNOSIS — M05649 Rheumatoid arthritis of unspecified hand with involvement of other organs and systems: Secondary | ICD-10-CM | POA: Diagnosis not present

## 2024-02-14 DIAGNOSIS — M6281 Muscle weakness (generalized): Secondary | ICD-10-CM | POA: Diagnosis not present

## 2024-02-18 NOTE — Progress Notes (Unsigned)
 Location:  [facility] of Service:  Clinic (12)  Provider:   Code Status: *** Goals of Care:     05/17/2023   10:00 AM  Advanced Directives  Does Patient Have a Medical Advance Directive? Yes  Type of Advance Directive Out of facility DNR (pink MOST or yellow form)  Does patient want to make changes to medical advance directive? No - Patient declined     Chief Complaint  Patient presents with   Follow-up    Patient would like discuss pain in hands that comes and goes. Patient states she thinks she has a sinus infection    HPI: Patient is a 89 y.o. female seen today for medical management of chronic diseases.     Past Medical History:  Diagnosis Date   Anxiety    Arthritis    Bipolar disorder (HCC)    Depression    Hyperlipidemia    Hypothyroidism    Irritable bowel syndrome    RA (rheumatoid arthritis) (HCC)     Past Surgical History:  Procedure Laterality Date   ABDOMINAL HYSTERECTOMY     Bone Spurs  08/07/13   Right Shoulder   COLONOSCOPY     COLONOSCOPY N/A 05/08/2012   Procedure: COLONOSCOPY;  Surgeon: Claudis RAYMOND Rivet, MD;  [facility];  Service: Endoscopy;  Laterality: N/A;  730   COLOSTOMY     ESOPHAGOGASTRODUODENOSCOPY N/A 11/04/2013   Procedure: ESOPHAGOGASTRODUODENOSCOPY (EGD);  Surgeon: Claudis RAYMOND Rivet, MD;  [facility];  Service: Endoscopy;  Laterality: N/A;  730   EYE SURGERY  cataract x 2   MALONEY DILATION N/A 11/04/2013   Procedure: MALONEY DILATION;  Surgeon: Claudis RAYMOND Rivet, MD;  [facility];  Service: Endoscopy;  Laterality: N/A;   UPPER GASTROINTESTINAL ENDOSCOPY      Allergies  Allergen Reactions   Penicillins Itching and Rash   Hydrocodone Other (See Comments)    MAO-I ; cannot take medications.    Morphine  And Codeine     Unable to take due to MAO-I   Morphine  Sulfate Other (See Comments)   Sulfamethoxazole-Trimethoprim Nausea Only, Other (See Comments) and Nausea And  Vomiting    And diarrhea.  Other Reaction(s): GI Intolerance    Outpatient Encounter Medications as of 02/13/2024  Medication Sig   acetaminophen  (TYLENOL ) 325 MG tablet Take 2 tablets (650 mg total) by mouth every morning.   acetaminophen  (TYLENOL ) 500 MG tablet Take 1,000 mg by mouth 3 (three) times daily as needed (joint pain).   amiodarone  (PACERONE ) 200 MG tablet Take 1 tablet (200 mg total) by mouth daily.   apixaban  (ELIQUIS ) 2.5 MG TABS tablet Take 1 tablet (2.5 mg total) by mouth 2 (two) times daily.   B Complex-C (B-COMPLEX WITH VITAMIN C) tablet Take 1 tablet by mouth in the morning.   cetirizine  (ZYRTEC ) 10 MG tablet Take 10 mg by mouth at bedtime. Give 10 mg by mouth at bedtime related to ESSENTIAL (PRIMARY) HYPERTENSION (I10)   Cholecalciferol (VITAMIN D ) 50 MCG (2000 UT) tablet Take 2,000 Units by mouth in the morning.   cyanocobalamin  (VITAMIN B12) 1000 MCG tablet Take 1,000 mcg by mouth in the morning.   docusate sodium  (COLACE) 100 MG capsule Take 1 capsule (100 mg total) by mouth 2 (two) times daily.   estradiol  (ESTRACE ) 0.1 MG/GM vaginal cream Place 1 Applicatorful vaginally 3 (three) times a week.   L-Methylfolate-Algae (L-METHYLFOLATE FORTE) 15-90.314 MG CAPS Take 1 capsule by mouth in  the morning.   levothyroxine  (SYNTHROID ) 100 MCG tablet Take 100 mcg by mouth daily.   LORazepam  (ATIVAN ) 0.5 MG tablet Take 0.5 tablets (0.25 mg total) by mouth daily.   LORazepam  (ATIVAN ) 0.5 MG tablet Take 1 tablet (0.5 mg total) by mouth at bedtime.   melatonin 5 MG TABS Take 5 mg by mouth at bedtime.   metoprolol  succinate (TOPROL -XL) 25 MG 24 hr tablet Take 2 tablets (50 mg total) by mouth in the morning.   Multiple Vitamins-Minerals (MULTIVITAMIN WITH MINERALS) tablet Take 1 tablet by mouth in the morning.   nitrofurantoin  (MACRODANTIN ) 50 MG capsule Take 1 capsule (50 mg total) by mouth at bedtime.   pantoprazole  (PROTONIX ) 40 MG tablet Take 1 tablet (40 mg total) by mouth  daily. Before dinner.   polyethylene glycol (MIRALAX  / GLYCOLAX ) 17 g packet Take 17 g by mouth daily.   REXULTI  1 MG TABS tablet Take 1 mg by mouth at bedtime.   venlafaxine  XR (EFFEXOR -XR) 75 MG 24 hr capsule Take 3 capsules by mouth daily with breakfast.   cetirizine  (ZYRTEC ) 10 MG tablet Take 0.5 tablets (5 mg total) by mouth at bedtime for 7 days. (Patient not taking: Reported on 02/13/2024)   No facility-administered encounter medications on file as of 02/13/2024.    Review of Systems:  Review of Systems  Health Maintenance  Topic Date Due   Medicare Annual Wellness (AWV)  01/31/2024   COVID-19 Vaccine (5 - Moderna risk 2025-26 season) 06/10/2024   DTaP/Tdap/Td (3 - Td or Tdap) 07/12/2032   Pneumococcal Vaccine: 50+ Years  Completed   Influenza Vaccine  Completed   Bone Density Scan  Completed   Zoster Vaccines- Shingrix  Completed   Meningococcal B Vaccine  Aged Out    Physical Exam: Vitals:   02/13/24 1207  BP: 138/80  Pulse: 81  Temp: 98 F (36.7 C)  SpO2: 95%  Weight: 123 lb 12.8 oz (56.2 kg)  Height: 5' (1.524 m)   Body mass index is 24.18 kg/m. Physical Exam  Labs reviewed: Basic Metabolic Panel: Recent Labs    05/15/23 0335 05/16/23 0520 05/17/23 0525 05/18/23 0345 09/07/23 1203 10/13/23 0000 01/08/24 0000  NA 139 137 138 135 141 141  --   K 3.6 4.4 4.2 4.1 4.4 4.0  --   CL 104 105 101 105 101 104  --   CO2 25 24 27 26 20  24*  --   GLUCOSE 93 95 119* 110* 84  --   --   BUN 7* 5* 10 9 15 16   --   CREATININE 0.66 0.69 0.79 0.71 0.82 0.9  --   CALCIUM 7.8* 8.3* 8.7* 8.4* 9.7 9.2  --   MG 1.7 2.0 1.9 1.9  --   --   --   PHOS 2.0* 3.0 3.6  --   --   --   --   TSH  --   --   --   --  10.500* 10.40* 3.38   Liver Function Tests: Recent Labs    05/08/23 2120 05/09/23 0747 05/13/23 0250 05/17/23 0525 09/07/23 1203 10/13/23 0000  AST 17 26  --   --  19 22  ALT 13 14  --   --  16 17  ALKPHOS 67 50  --   --  88 69  BILITOT 0.4 0.7  --   --  <0.2   --   PROT 8.1 7.0  --   --  7.6  --  ALBUMIN  4.6 3.3*   < > 3.0* 4.4 3.8   < > = values in this interval not displayed.   Recent Labs    05/08/23 2120  LIPASE <10*   No results for input(s): AMMONIA in the last 8760 hours. CBC: Recent Labs    05/13/23 0250 05/14/23 0320 05/15/23 0335 05/16/23 0520 05/17/23 0525 10/13/23 0000  WBC 11.6* 12.9* 9.3 10.7* 11.8* 5.6  NEUTROABS 8.5* 8.1* 7.0  --   --   --   HGB 11.5* 10.3* 8.8* 9.0* 9.6* 12.6  HCT 33.4* 30.1* 26.3* 27.6* 28.8* 38  MCV 94.4 95.3 96.3 97.9 97.3  --   PLT 120* 127* 141* 171 190 174   Lipid Panel: No results for input(s): CHOL, HDL, LDLCALC, TRIG, CHOLHDL, LDLDIRECT in the last 8760 hours. No results found for: HGBA1C  Procedures since last visit: No results found.  Assessment/Plan 1. Non-seasonal allergic rhinitis, unspecified trigger (Primary) ***  2. Acquired hypothyroidism ***  3. Paroxysmal atrial fibrillation (HCC) ***  4. Gastroesophageal reflux disease, unspecified whether esophagitis present ***    Labs/tests ordered:  * No order type specified * Next appt:  05/13/2024

## 2024-02-29 ENCOUNTER — Ambulatory Visit (HOSPITAL_COMMUNITY): Admission: RE | Admit: 2024-02-29 | Discharge: 2024-02-29 | Attending: Internal Medicine | Admitting: Internal Medicine

## 2024-02-29 VITALS — BP 142/84 | HR 84 | Ht 60.0 in | Wt 123.6 lb

## 2024-02-29 DIAGNOSIS — D6869 Other thrombophilia: Secondary | ICD-10-CM

## 2024-02-29 DIAGNOSIS — Z79899 Other long term (current) drug therapy: Secondary | ICD-10-CM

## 2024-02-29 DIAGNOSIS — I4891 Unspecified atrial fibrillation: Secondary | ICD-10-CM

## 2024-02-29 DIAGNOSIS — Z5181 Encounter for therapeutic drug level monitoring: Secondary | ICD-10-CM

## 2024-02-29 DIAGNOSIS — I480 Paroxysmal atrial fibrillation: Secondary | ICD-10-CM

## 2024-02-29 NOTE — Progress Notes (Signed)
 Primary Care Physician: Charlanne Fredia CROME, MD Primary Cardiologist: Lynwood Schilling, MD Electrophysiologist: None     Referring Physician: Dr. Cheryle Maryelizabeth VEAR [name] with a history of bowel obstruction, anemia, HTN, RA, bipolar disorder, hypothyroidism, HLD, and paroxysmal atrial fibrillation who presents for consultation in the [facility]. Hospital admission 2/24-05/18/23 for SBO and aspiration pneumonia with course complicated by Afib with RVR. Discharged on amiodarone  and OAC and Toprol  50 mg daily. Patient is on Eliquis  2.5 mg BID for a CHADS2VASC score of 4.  On evaluation today, she is currently in NSR. Patient notes to be nervous today. She loaded on amiodarone  200 mg BID until 3/8 and transitioned to 200 mg once daily. No bleeding issues on Eliquis  2.5 mg BID. She has felt okay since hospital discharge, currently undergoing rehab at [facility] getting strength back slowly.   On follow up 09/07/23, patient is here for amiodarone  surveillance. She is currently in NSR. No missed doses of amiodarone  200 mg daily or Eliquis  2.5 mg BID.   Follow-up 02/29/2024 for amiodarone  surveillance.  Patient is currently in NSR.  She appears to have overall low A-fib burden since last office visit.  She is taking amiodarone  200 mg daily.  No bleeding issues on Eliquis .  Today, she denies symptoms of palpitations, chest pain, shortness of breath, orthopnea, PND, lower extremity edema, dizziness, presyncope, syncope, snoring, daytime somnolence, bleeding, or neurologic sequela. The patient is tolerating medications without difficulties and is otherwise without complaint today.   she has a BMI of Body mass index is 24.14 kg/m.Erica Filed Weights   02/29/24 1116  Weight: 56.1 kg     Current Outpatient Medications  Medication Sig Dispense Refill   acetaminophen  (TYLENOL ) 500 MG tablet Take 1,000 mg by mouth 3 (three) times daily as needed (joint pain).      amiodarone  (PACERONE ) 200 MG tablet Take 1 tablet (200 mg total) by mouth daily.     apixaban  (ELIQUIS ) 2.5 MG TABS tablet Take 1 tablet (2.5 mg total) by mouth 2 (two) times daily. 60 tablet 0   B Complex-C (B-COMPLEX WITH VITAMIN C) tablet Take 1 tablet by mouth in the morning.     cetirizine  (ZYRTEC ) 10 MG tablet Take 10 mg by mouth at bedtime. Give 10 mg by mouth at bedtime related to ESSENTIAL (PRIMARY) HYPERTENSION (I10)     Cholecalciferol (VITAMIN D ) 50 MCG (2000 UT) tablet Take 2,000 Units by mouth in the morning.     cyanocobalamin  (VITAMIN B12) 1000 MCG tablet Take 1,000 mcg by mouth in the morning.     docusate sodium  (COLACE) 100 MG capsule Take 1 capsule (100 mg total) by mouth 2 (two) times daily. 10 capsule 0   estradiol  (ESTRACE ) 0.1 MG/GM vaginal cream Place 1 Applicatorful vaginally 2 (two) times a week.     L-Methylfolate-Algae (L-METHYLFOLATE FORTE) 15-90.314 MG CAPS Take 1 capsule by mouth in the morning.     levothyroxine  (SYNTHROID ) 100 MCG tablet Take 100 mcg by mouth daily.     LORazepam  (ATIVAN ) 0.5 MG tablet Take 0.5 tablets (0.25 mg total) by mouth daily. 30 tablet 3   melatonin 5 MG TABS Take 5 mg by mouth at bedtime.     metoprolol  succinate (TOPROL -XL) 25 MG 24 hr tablet Take 2 tablets (50 mg total) by mouth in the morning.     Multiple Vitamins-Minerals (MULTIVITAMIN WITH MINERALS) tablet Take 1 tablet by mouth in the morning.  nitrofurantoin  (MACRODANTIN ) 50 MG capsule Take 1 capsule (50 mg total) by mouth at bedtime. 90 capsule 3   pantoprazole  (PROTONIX ) 40 MG tablet Take 1 tablet (40 mg total) by mouth daily. Before dinner.     polyethylene glycol (MIRALAX  / GLYCOLAX ) 17 g packet Take 17 g by mouth daily. 14 each 0   REXULTI  1 MG TABS tablet Take 1 mg by mouth at bedtime.     venlafaxine  XR (EFFEXOR -XR) 75 MG 24 hr capsule Take 3 capsules by mouth daily with breakfast.     acetaminophen  (TYLENOL ) 325 MG tablet Take 2 tablets (650 mg total) by mouth every  morning.     cetirizine  (ZYRTEC ) 10 MG tablet Take 0.5 tablets (5 mg total) by mouth at bedtime for 7 days.     LORazepam  (ATIVAN ) 0.5 MG tablet Take 1 tablet (0.5 mg total) by mouth at bedtime. 30 tablet 2   No current facility-administered medications for this encounter.    Atrial Fibrillation Management history:  Previous antiarrhythmic drugs: amiodarone  Previous cardioversions: none Previous ablations: none Anticoagulation history: Eliquis  2.5 mg BID   ROS- All systems are reviewed and negative except as per the HPI above.  Physical Exam: BP (!) 142/84   Pulse 84   Ht 5' (1.524 m)   Wt 56.1 kg   BMI 24.14 kg/m   GEN- The patient is well appearing, alert and oriented x 3 today.   Neck - no JVD or carotid bruit noted Lungs- Clear to ausculation bilaterally, normal work of breathing Heart- Regular rate and rhythm, no murmurs, rubs or gallops, PMI not laterally displaced Extremities- no clubbing, cyanosis, or edema Skin - no rash or ecchymosis noted   EKG today demonstrates  EKG Interpretation Date/Time:  Thursday February 29 2024 11:47:32 EST Ventricular Rate:  84 PR Interval:  160 QRS Duration:  74 QT Interval:  400 QTC Calculation: 472 R Axis:   82  Text Interpretation: Normal sinus rhythm with sinus arrhythmia Nonspecific T wave abnormality When compared with ECG of 07-Sep-2023 11:15, No significant change was found Confirmed by Terra Pac ([id number]) on 02/29/2024 11:50:32 AM    Echo 05/12/23 demonstrated  1. Left ventricular ejection fraction, by estimation, is 70 to 75%. Left  ventricular ejection fraction by PLAX is 75 %. The left ventricle has  hyperdynamic function. The left ventricle has no regional wall motion  abnormalities. Left ventricular  diastolic parameters are consistent with Grade I diastolic dysfunction  (impaired relaxation).   2. Right ventricular systolic function is normal. The right ventricular  size is normal. There is normal pulmonary  artery systolic pressure. The  estimated right ventricular systolic pressure is 29.8 mmHg.   3. The mitral valve is normal in structure. Mild mitral valve  regurgitation. Moderate mitral annular calcification.   4. The aortic valve is tricuspid. Aortic valve regurgitation is not  visualized. Aortic valve sclerosis is present, with no evidence of aortic  valve stenosis.   5. The inferior vena cava is normal in size with greater than 50%  respiratory variability, suggesting right atrial pressure of 3 mmHg.   ASSESSMENT & PLAN CHA2DS2-VASc Score = 4  The patient's score is based upon: CHF History: 0 HTN History: 1 Diabetes History: 0 Stroke History: 0 Vascular Disease History: 0 Age Score: 2 Gender Score: 1       ASSESSMENT AND PLAN: Paroxysmal Atrial Fibrillation (ICD10:  I48.0) The patient's CHA2DS2-VASc score is 4, indicating a 4.8% annual risk of stroke.    Patient is  currently in NSR.  We had an extended discussion with family regarding the discontinuation or continuation of amiodarone .  We do note that patient had A-fib with RVR likely secondary to SBO during hospital admission.  We discussed stopping the amiodarone  and observing the patient several months after to determine if there is an increased burden of A-fib or not.  We discussed the possibility that patient may not have A-fib given the extenuating circumstances of initial episode.  We also noted that with patient's advanced age and the substrate for A-fib, we cannot rule out that patient would not have A-fib in the future if taken off amiodarone .  Patient does not have any symptoms or adverse effects with amiodarone  and is tolerating the medication without issue.  After discussion with family, they elect to continue the antiarrhythmic medication to ensure suppression of A-fib.  Secondary Hypercoagulable State (ICD10:  D68.69) The patient is at significant risk for stroke/thromboembolism based upon her CHA2DS2-VASc Score of 4.   Continue Apixaban  (Eliquis ).  Continue Eliquis  2.5 mg twice daily.  Dosage is correct based on age greater than 48 years old and weight less than 60 kg.  High risk medication monitoring (ICD10: J342684) Patient requires ongoing monitoring for anti-arrhythmic medication which has the potential to cause life threatening arrhythmias or AV block. Qtc stable. Continue amiodarone  200 mg daily. CMET drawn today.  TSH level drawn on 10/27 stable.    Follow up 6 months for amiodarone  surveillance.   Terra Pac, PA-C  [facility] [address] [place], [place] [address] [phone]

## 2024-03-01 ENCOUNTER — Ambulatory Visit (HOSPITAL_COMMUNITY): Payer: Self-pay | Admitting: Internal Medicine

## 2024-03-01 LAB — COMPREHENSIVE METABOLIC PANEL WITH GFR
ALT: 14 IU/L (ref 0–32)
AST: 18 IU/L (ref 0–40)
Albumin: 4.5 g/dL (ref 3.7–4.7)
Alkaline Phosphatase: 69 IU/L (ref 48–129)
BUN/Creatinine Ratio: 19 (ref 12–28)
BUN: 17 mg/dL (ref 8–27)
Bilirubin Total: 0.3 mg/dL (ref 0.0–1.2)
CO2: 20 mmol/L (ref 20–29)
Calcium: 9.7 mg/dL (ref 8.7–10.3)
Chloride: 102 mmol/L (ref 96–106)
Creatinine, Ser: 0.88 mg/dL (ref 0.57–1.00)
Globulin, Total: 2.9 g/dL (ref 1.5–4.5)
Glucose: 93 mg/dL (ref 70–99)
Potassium: 4.4 mmol/L (ref 3.5–5.2)
Sodium: 146 mmol/L — ABNORMAL HIGH (ref 134–144)
Total Protein: 7.4 g/dL (ref 6.0–8.5)
eGFR: 63 mL/min/1.73

## 2024-03-04 ENCOUNTER — Non-Acute Institutional Stay: Payer: Self-pay | Admitting: Adult Health

## 2024-03-04 ENCOUNTER — Encounter: Payer: Self-pay | Admitting: Adult Health

## 2024-03-04 DIAGNOSIS — R350 Frequency of micturition: Secondary | ICD-10-CM | POA: Diagnosis not present

## 2024-03-04 DIAGNOSIS — R32 Unspecified urinary incontinence: Secondary | ICD-10-CM

## 2024-03-04 NOTE — Progress Notes (Signed)
 " Location:  [facility] Room Number: 515 P Place of Service:  ALF [phone]) Provider:  Tawni America, NP   Patient Care Team: Charlanne Fredia CROME, MD as PCP - General (Internal Medicine) Lynwood Schilling, MD as PCP - Cardiology (Cardiology) Vincente Grip, MD as Consulting Physician (Psychiatry) Sheryle Carwin, MD as Consulting Physician (Internal Medicine)  Extended Emergency Contact Information Primary Emergency Contact: [place], [place], [place] [address] [place] Home Phone: [phone] Mobile Phone: [phone] Relation: Son Secondary Emergency Contact: Dowty,sandy Mobile Phone: [phone] Relation: Son  Code Status:  DNR Goals of care: Advanced Directive information    05/17/2023   10:00 AM  Advanced Directives  Does Patient Have a Medical Advance Directive? Yes  Type of Advance Directive Out of facility DNR (pink MOST or yellow form)  Does patient want to make changes to medical advance directive? No - Patient declined     Chief Complaint  Patient presents with   Acute Visit    Incontinence     HPI:  Pt is a 89 y.o. female seen today for an acute visit for incontinence   She has a hx of recurrent UTI and is on macrodantin . She reports she is concerned about her incontinence and is questioning the use of estrace  cream. It was started due to symptoms of atrophic vaginitis and UTI prevention.   Reports she has seen urology before for recurrent UTI and nephrolithiasis Dr Jacquline   Denies any fever or dysuria Has chronic frequency and incontinence.  Prior neg UA for the same symptoms.    Past Medical History:  Diagnosis Date   Anxiety    Arthritis    Bipolar disorder (HCC)    Depression    Hyperlipidemia    Hypothyroidism    Irritable bowel syndrome    RA (rheumatoid arthritis) (HCC)    Past Surgical History:  Procedure Laterality Date   ABDOMINAL HYSTERECTOMY     Bone Spurs  08/07/13   Right  Shoulder   COLONOSCOPY     COLONOSCOPY N/A 05/08/2012   Procedure: COLONOSCOPY;  Surgeon: Claudis RAYMOND Rivet, MD;  Location: [facility];  Service: Endoscopy;  Laterality: N/A;  730   COLOSTOMY     ESOPHAGOGASTRODUODENOSCOPY N/A 11/04/2013   Procedure: ESOPHAGOGASTRODUODENOSCOPY (EGD);  Surgeon: Claudis RAYMOND Rivet, MD;  Location: [facility];  Service: Endoscopy;  Laterality: N/A;  730   EYE SURGERY  cataract x 2   MALONEY DILATION N/A 11/04/2013   Procedure: MALONEY DILATION;  Surgeon: Claudis RAYMOND Rivet, MD;  Location: [facility];  Service: Endoscopy;  Laterality: N/A;   UPPER GASTROINTESTINAL ENDOSCOPY      Allergies[1]  Outpatient Encounter Medications as of 03/04/2024  Medication Sig   acetaminophen  (TYLENOL ) 325 MG tablet Take 2 tablets (650 mg total) by mouth every morning.   acetaminophen  (TYLENOL ) 500 MG tablet Take 1,000 mg by mouth 3 (three) times daily as needed (joint pain).   amiodarone  (PACERONE ) 200 MG tablet Take 1 tablet (200 mg total) by mouth daily.   apixaban  (ELIQUIS ) 2.5 MG TABS tablet Take 1 tablet (2.5 mg total) by mouth 2 (two) times daily.   B Complex-C (B-COMPLEX WITH VITAMIN C) tablet Take 1 tablet by mouth in the morning.   Cholecalciferol (VITAMIN D ) 50 MCG (2000 UT) tablet Take 2,000 Units by mouth in the morning.   cyanocobalamin  (VITAMIN B12) 1000 MCG tablet Take 1,000 mcg by mouth in the morning.  docusate sodium  (COLACE) 100 MG capsule Take 1 capsule (100 mg total) by mouth 2 (two) times daily.   estradiol  (ESTRACE ) 0.1 MG/GM vaginal cream Place 1 Applicatorful vaginally 2 (two) times a week.   L-Methylfolate-Algae (L-METHYLFOLATE FORTE) 15-90.314 MG CAPS Take 1 capsule by mouth in the morning.   levocetirizine (XYZAL ALLERGY 24HR) 5 MG tablet Take 5 mg by mouth at bedtime. Give 1 tablet by mouth at bedtime for seasonal allergies   levothyroxine  (SYNTHROID ) 100 MCG tablet Take 100 mcg by mouth daily.   LORazepam  (ATIVAN ) 0.5 MG tablet Take 0.5 tablets  (0.25 mg total) by mouth daily.   LORazepam  (ATIVAN ) 0.5 MG tablet Take 1 tablet (0.5 mg total) by mouth at bedtime.   melatonin 5 MG TABS Take 5 mg by mouth at bedtime.   metoprolol  succinate (TOPROL -XL) 25 MG 24 hr tablet Take 2 tablets (50 mg total) by mouth in the morning.   Multiple Vitamins-Minerals (MULTIVITAMIN WITH MINERALS) tablet Take 1 tablet by mouth in the morning.   nitrofurantoin  (MACRODANTIN ) 50 MG capsule Take 1 capsule (50 mg total) by mouth at bedtime.   pantoprazole  (PROTONIX ) 40 MG tablet Take 1 tablet (40 mg total) by mouth daily. Before dinner.   polyethylene glycol (MIRALAX  / GLYCOLAX ) 17 g packet Take 17 g by mouth daily.   REXULTI  1 MG TABS tablet Take 1 mg by mouth at bedtime.   venlafaxine  XR (EFFEXOR -XR) 75 MG 24 hr capsule Take 3 capsules by mouth daily with breakfast.   cetirizine  (ZYRTEC ) 10 MG tablet Take 0.5 tablets (5 mg total) by mouth at bedtime for 7 days. (Patient not taking: Reported on 03/04/2024)   cetirizine  (ZYRTEC ) 10 MG tablet Take 10 mg by mouth at bedtime. Give 10 mg by mouth at bedtime related to ESSENTIAL (PRIMARY) HYPERTENSION (I10) (Patient not taking: Reported on 03/04/2024)   No facility-administered encounter medications on file as of 03/04/2024.    Review of Systems  Constitutional:  Negative for activity change, appetite change, chills, diaphoresis, fatigue, fever and unexpected weight change.  Genitourinary:  Positive for frequency and urgency. Negative for decreased urine volume, difficulty urinating, dysuria, flank pain, hematuria and pelvic pain.       Incontinence  Musculoskeletal:  Positive for arthralgias and gait problem.  Psychiatric/Behavioral:  Positive for confusion. The patient is nervous/anxious.     Immunization History  Administered Date(s) Administered   Influenza-Unspecified 01/03/2023, 12/12/2023   Moderna Covid-19 Vaccine Bivalent Booster 1yrs & up 01/03/2023   Moderna Sars-Covid-2 Vaccination 04/05/2019,  05/01/2019   Pneumococcal Conjugate-13 12/21/2016   Pneumococcal Polysaccharide-23 03/11/2020   Tdap 08/23/2012, 07/13/2022   Unspecified SARS-COV-2 Vaccination 12/12/2023, 12/15/2023   Zoster Recombinant(Shingrix) 06/09/2020, 09/22/2020   Pertinent  Health Maintenance Due  Topic Date Due   Influenza Vaccine  Completed   Bone Density Scan  Completed      05/05/2020    8:00 AM 05/05/2020    8:54 PM 05/06/2020   12:00 PM 07/11/2023    2:52 PM 10/16/2023    2:46 PM  Fall Risk  Falls in the past year?    1 0  Was there an injury with Fall?    0  0   Fall Risk Category Calculator    1 0  (RETIRED) Patient Fall Risk Level High fall risk  High fall risk  High fall risk     Patient at Risk for Falls Due to    Impaired balance/gait;Impaired mobility Impaired mobility  Fall risk Follow up  Falls evaluation completed Falls evaluation completed     Data saved with a previous flowsheet row definition   Functional Status Survey:    Vitals:   03/04/24 1027  BP: (!) 155/74  Pulse: 83  Resp: 16  Temp: (!) 97 F (36.1 C)  SpO2: 98%  Weight: 123 lb 3.2 oz (55.9 kg)  Height: 5' (1.524 m)   Body mass index is 24.06 kg/m. Physical Exam  Labs reviewed: Recent Labs    05/15/23 0335 05/16/23 0520 05/17/23 0525 05/18/23 0345 09/07/23 1203 10/13/23 0000 02/29/24 1231  NA 139 137 138 135 141 141 146*  K 3.6 4.4 4.2 4.1 4.4 4.0 4.4  CL 104 105 101 105 101 104 102  CO2 25 24 27 26 20  24* 20  GLUCOSE 93 95 119* 110* 84  --  93  BUN 7* 5* 10 9 15 16 17   CREATININE 0.66 0.69 0.79 0.71 0.82 0.9 0.88  CALCIUM 7.8* 8.3* 8.7* 8.4* 9.7 9.2 9.7  MG 1.7 2.0 1.9 1.9  --   --   --   PHOS 2.0* 3.0 3.6  --   --   --   --    Recent Labs    05/09/23 0747 05/13/23 0250 09/07/23 1203 10/13/23 0000 02/29/24 1231  AST 26  --  19 22 18   ALT 14  --  16 17 14   ALKPHOS 50  --  88 69 69  BILITOT 0.7  --  <0.2  --  0.3  PROT 7.0  --  7.6  --  7.4  ALBUMIN  3.3*   < > 4.4 3.8 4.5   < > = values in  this interval not displayed.   Recent Labs    05/13/23 0250 05/14/23 0320 05/15/23 0335 05/16/23 0520 05/17/23 0525 10/13/23 0000  WBC 11.6* 12.9* 9.3 10.7* 11.8* 5.6  NEUTROABS 8.5* 8.1* 7.0  --   --   --   HGB 11.5* 10.3* 8.8* 9.0* 9.6* 12.6  HCT 33.4* 30.1* 26.3* 27.6* 28.8* 38  MCV 94.4 95.3 96.3 97.9 97.3  --   PLT 120* 127* 141* 171 190 174   Lab Results  Component Value Date   TSH 3.38 01/08/2024   No results found for: HGBA1C No results found for: CHOL, HDL, LDLCALC, LDLDIRECT, TRIG, CHOLHDL  Significant Diagnostic Results in last 30 days:  No results found.  Assessment/Plan  1. Urinary frequency (Primary) Consider myrbetriq Will have her f/u with urology for input.  Wants to change the estrace  to prn despite education that this med is to prevent irritation and UTI  2. Urinary incontinence, unspecified type Long standing issue we discussed is not likely reversible.       [1]  Allergies Allergen Reactions   Penicillins Itching and Rash   Hydrocodone Other (See Comments)    MAO-I ; cannot take medications.    Morphine  And Codeine     Unable to take due to MAO-I   Morphine  Sulfate Other (See Comments)   Sulfamethoxazole-Trimethoprim Nausea Only, Other (See Comments) and Nausea And Vomiting    And diarrhea.  Other Reaction(s): GI Intolerance   "

## 2024-05-13 ENCOUNTER — Encounter: Admitting: Adult Health

## 2024-08-29 ENCOUNTER — Ambulatory Visit (HOSPITAL_COMMUNITY): Admitting: Internal Medicine
